# Patient Record
Sex: Male | Born: 1950 | Race: White | Hispanic: No | Marital: Married | Smoking: Former smoker
Health system: Southern US, Community
[De-identification: ages and names within clinical notes are randomized; demographics above are authoritative.]

## PROBLEM LIST (undated history)

## (undated) DIAGNOSIS — IMO0002 Reserved for concepts with insufficient information to code with codable children: Secondary | ICD-10-CM

## (undated) DIAGNOSIS — I739 Peripheral vascular disease, unspecified: Secondary | ICD-10-CM

## (undated) DIAGNOSIS — R600 Localized edema: Secondary | ICD-10-CM

## (undated) DIAGNOSIS — D649 Anemia, unspecified: Secondary | ICD-10-CM

## (undated) DIAGNOSIS — C801 Malignant (primary) neoplasm, unspecified: Secondary | ICD-10-CM

## (undated) DIAGNOSIS — N19 Unspecified kidney failure: Secondary | ICD-10-CM

## (undated) DIAGNOSIS — I10 Essential (primary) hypertension: Secondary | ICD-10-CM

## (undated) DIAGNOSIS — I639 Cerebral infarction, unspecified: Secondary | ICD-10-CM

## (undated) DIAGNOSIS — E785 Hyperlipidemia, unspecified: Secondary | ICD-10-CM

## (undated) DIAGNOSIS — N184 Chronic kidney disease, stage 4 (severe): Secondary | ICD-10-CM

## (undated) DIAGNOSIS — I2510 Atherosclerotic heart disease of native coronary artery without angina pectoris: Secondary | ICD-10-CM

## (undated) DIAGNOSIS — E119 Type 2 diabetes mellitus without complications: Secondary | ICD-10-CM

## (undated) DIAGNOSIS — N9989 Other postprocedural complications and disorders of genitourinary system: Secondary | ICD-10-CM

## (undated) DIAGNOSIS — M25519 Pain in unspecified shoulder: Secondary | ICD-10-CM

## (undated) DIAGNOSIS — H269 Unspecified cataract: Secondary | ICD-10-CM

## (undated) HISTORY — DX: Unspecified cataract: H26.9

## (undated) HISTORY — DX: Essential (primary) hypertension: I10

## (undated) HISTORY — DX: Peripheral vascular disease, unspecified: I73.9

## (undated) HISTORY — PX: EYE SURGERY: SHX253

## (undated) HISTORY — DX: Type 2 diabetes mellitus without complications: E11.9

## (undated) HISTORY — PX: FRACTURE SURGERY: SHX138

## (undated) HISTORY — DX: Unspecified kidney failure: N19

---

## 1988-08-03 HISTORY — PX: COLONOSCOPY: SHX174

## 1997-11-02 ENCOUNTER — Emergency Department (HOSPITAL_COMMUNITY): Admission: EM | Admit: 1997-11-02 | Discharge: 1997-11-02 | Payer: Self-pay | Admitting: Emergency Medicine

## 1998-02-25 ENCOUNTER — Ambulatory Visit (HOSPITAL_COMMUNITY): Admission: RE | Admit: 1998-02-25 | Discharge: 1998-02-25 | Payer: Self-pay | Admitting: Internal Medicine

## 1998-04-01 ENCOUNTER — Ambulatory Visit (HOSPITAL_COMMUNITY): Admission: RE | Admit: 1998-04-01 | Discharge: 1998-04-01 | Payer: Self-pay | Admitting: Gastroenterology

## 2004-06-12 ENCOUNTER — Encounter: Admission: RE | Admit: 2004-06-12 | Discharge: 2004-06-12 | Payer: Self-pay | Admitting: Nephrology

## 2004-07-21 ENCOUNTER — Encounter: Admission: RE | Admit: 2004-07-21 | Discharge: 2004-07-21 | Payer: Self-pay | Admitting: Nephrology

## 2004-08-11 ENCOUNTER — Ambulatory Visit (HOSPITAL_COMMUNITY): Admission: RE | Admit: 2004-08-11 | Discharge: 2004-08-11 | Payer: Self-pay | Admitting: Nephrology

## 2004-08-13 ENCOUNTER — Ambulatory Visit (HOSPITAL_COMMUNITY): Admission: RE | Admit: 2004-08-13 | Discharge: 2004-08-14 | Payer: Self-pay | Admitting: Nephrology

## 2006-08-03 DIAGNOSIS — E1121 Type 2 diabetes mellitus with diabetic nephropathy: Secondary | ICD-10-CM

## 2006-08-03 DIAGNOSIS — I10 Essential (primary) hypertension: Secondary | ICD-10-CM

## 2006-08-03 HISTORY — DX: Type 2 diabetes mellitus with diabetic nephropathy: E11.21

## 2006-08-03 HISTORY — DX: Essential (primary) hypertension: I10

## 2009-04-03 ENCOUNTER — Ambulatory Visit: Payer: Self-pay | Admitting: Family Medicine

## 2011-06-09 ENCOUNTER — Ambulatory Visit: Payer: Self-pay | Admitting: General Surgery

## 2011-06-09 HISTORY — PX: COLONOSCOPY: SHX174

## 2011-06-10 LAB — PATHOLOGY REPORT

## 2013-02-10 ENCOUNTER — Encounter: Payer: Self-pay | Admitting: *Deleted

## 2015-02-26 DIAGNOSIS — N185 Chronic kidney disease, stage 5: Secondary | ICD-10-CM | POA: Insufficient documentation

## 2015-02-26 DIAGNOSIS — N184 Chronic kidney disease, stage 4 (severe): Secondary | ICD-10-CM | POA: Insufficient documentation

## 2015-02-26 DIAGNOSIS — E785 Hyperlipidemia, unspecified: Secondary | ICD-10-CM | POA: Insufficient documentation

## 2015-02-26 DIAGNOSIS — I10 Essential (primary) hypertension: Secondary | ICD-10-CM | POA: Insufficient documentation

## 2015-02-27 ENCOUNTER — Encounter: Payer: Self-pay | Admitting: Family Medicine

## 2015-02-27 ENCOUNTER — Telehealth: Payer: Self-pay

## 2015-02-27 ENCOUNTER — Ambulatory Visit (INDEPENDENT_AMBULATORY_CARE_PROVIDER_SITE_OTHER): Payer: BLUE CROSS/BLUE SHIELD | Admitting: Family Medicine

## 2015-02-27 ENCOUNTER — Ambulatory Visit
Admission: RE | Admit: 2015-02-27 | Discharge: 2015-02-27 | Disposition: A | Discharge status: Home / Self Care | Payer: BLUE CROSS/BLUE SHIELD | Source: Ambulatory Visit | Attending: Family Medicine | Admitting: Family Medicine

## 2015-02-27 VITALS — BP 122/80 | HR 70 | Temp 97.5°F | Resp 16 | Wt 243.0 lb

## 2015-02-27 DIAGNOSIS — I10 Essential (primary) hypertension: Secondary | ICD-10-CM | POA: Diagnosis not present

## 2015-02-27 DIAGNOSIS — M19072 Primary osteoarthritis, left ankle and foot: Secondary | ICD-10-CM | POA: Diagnosis not present

## 2015-02-27 DIAGNOSIS — L729 Follicular cyst of the skin and subcutaneous tissue, unspecified: Secondary | ICD-10-CM | POA: Diagnosis not present

## 2015-02-27 DIAGNOSIS — M7742 Metatarsalgia, left foot: Secondary | ICD-10-CM

## 2015-02-27 DIAGNOSIS — M7732 Calcaneal spur, left foot: Secondary | ICD-10-CM | POA: Diagnosis not present

## 2015-02-27 MED ORDER — TELMISARTAN 80 MG PO TABS: ORAL_TABLET | ORAL | Status: DC

## 2015-02-27 NOTE — Telephone Encounter (Signed)
Patient has been advised, his question to you is what can podiatry do for bone spurs? And if you do advise still for patient to be seen by podiatry he states that he will go based of your opinion. KW

## 2015-02-27 NOTE — Progress Notes (Signed)
Subjective:     Patient ID: Jeremy Dorsey, male   DOB: September 10, 1950, 64 y.o.   MRN: [id number]  HPI  Chief Complaint  Patient presents with  . Groin Swelling    patient reports that he had sweling a month ago on the left side of his scrotum he states that he did not have trauma  . Foot Pain    patient reports pain in the left foot for a month described as a dull ache, patient states that pain is at heel of foot and  radiates up foot to the 2nd digit  States he felt a painless knot in the left side of his scrotum but has resolved over the last week. Regarding his foot pain states he recalls jumping down from a height a few weeks before onset of his foot pain but no other injury reported. Wishes refill on blood pressure medication. Usually has Micardis filled by his renal doctor but has run out and only sees him annually at this time. Accompanied by his wife.   Review of Systems  Genitourinary: Negative for dysuria.       Objective:   Physical Exam  Constitutional: He appears well-developed and well-nourished. No distress.  Genitourinary: Right testis shows no mass, no swelling and no tenderness. Left testis shows no mass, no swelling and no tenderness.  Musculoskeletal:  Tender over the dorsum and plantar aspect of his foot @ his left second metatarsal. Left DF/PF 5/5. No overlying erythema       Assessment:    1. Scrotal cyst: spontaneously resolved   2. Metatarsalgia, left - DG Foot Complete Left; Future  3. Essential hypertension - telmisartan (MICARDIS) 80 MG tablet; TWICE DAILY  Dispense: 180 tablet; Refill: 3 - telmisartan (MICARDIS) 80 MG tablet; TWICE DAILY  Dispense: 14 tablet; Refill: 0    Plan:    Consider podiatry referral pending x-ray results.

## 2015-02-27 NOTE — Telephone Encounter (Signed)
-----   Message from Carmon Ginsberg, [place] sent at 02/27/2015 11:58 AM EDT ----- No fracture but there are bone spurs at the base of your big toe. Do you wish to proceed with podiatry referral?

## 2015-02-27 NOTE — Patient Instructions (Signed)
WE WILL CALL WITH X-RAY RESULTS

## 2015-02-28 NOTE — Telephone Encounter (Signed)
If podiatrist feels bone spurs are contributing to your pain may consider injection or possibly surgery. On the other hand they may not be contributing to your pain and an alternative diagnosis will be suggested. Will proceed with referral if patient wishes.

## 2015-02-28 NOTE — Telephone Encounter (Signed)
LMTCB  aa 

## 2015-03-06 NOTE — Telephone Encounter (Signed)
LMTCB-KW 

## 2015-03-06 NOTE — Telephone Encounter (Signed)
Spoke with patient and he states at this time he would not like to see podiatry, he states that he will see how he does and if pain persist then he will call back and request referral.

## 2015-03-25 ENCOUNTER — Other Ambulatory Visit: Payer: Self-pay | Admitting: Family Medicine

## 2015-03-25 DIAGNOSIS — E1122 Type 2 diabetes mellitus with diabetic chronic kidney disease: Secondary | ICD-10-CM

## 2015-03-25 DIAGNOSIS — E785 Hyperlipidemia, unspecified: Secondary | ICD-10-CM

## 2015-03-25 MED ORDER — GLIPIZIDE 10 MG PO TABS: 10.0000 mg | ORAL_TABLET | Freq: Two times a day (BID) | ORAL | Status: DC

## 2015-03-25 MED ORDER — PRAVASTATIN SODIUM 80 MG PO TABS: 80.0000 mg | ORAL_TABLET | Freq: Every day | ORAL | Status: DC

## 2015-04-03 ENCOUNTER — Encounter: Payer: Self-pay | Admitting: Family Medicine

## 2015-05-09 ENCOUNTER — Other Ambulatory Visit: Payer: Self-pay | Admitting: Family Medicine

## 2015-08-07 DIAGNOSIS — N184 Chronic kidney disease, stage 4 (severe): Secondary | ICD-10-CM | POA: Diagnosis not present

## 2015-08-07 DIAGNOSIS — R809 Proteinuria, unspecified: Secondary | ICD-10-CM | POA: Diagnosis not present

## 2015-08-07 DIAGNOSIS — I129 Hypertensive chronic kidney disease with stage 1 through stage 4 chronic kidney disease, or unspecified chronic kidney disease: Secondary | ICD-10-CM | POA: Diagnosis not present

## 2015-08-07 DIAGNOSIS — E1129 Type 2 diabetes mellitus with other diabetic kidney complication: Secondary | ICD-10-CM | POA: Diagnosis not present

## 2015-09-02 DIAGNOSIS — N184 Chronic kidney disease, stage 4 (severe): Secondary | ICD-10-CM | POA: Diagnosis not present

## 2015-09-04 DIAGNOSIS — N184 Chronic kidney disease, stage 4 (severe): Secondary | ICD-10-CM | POA: Diagnosis not present

## 2015-09-04 DIAGNOSIS — E1129 Type 2 diabetes mellitus with other diabetic kidney complication: Secondary | ICD-10-CM | POA: Diagnosis not present

## 2015-09-30 ENCOUNTER — Ambulatory Visit
Admission: RE | Admit: 2015-09-30 | Discharge: 2015-09-30 | Disposition: A | Discharge status: Home / Self Care | Payer: PPO | Source: Ambulatory Visit | Attending: Family Medicine | Admitting: Family Medicine

## 2015-09-30 ENCOUNTER — Telehealth: Payer: Self-pay

## 2015-09-30 ENCOUNTER — Ambulatory Visit (INDEPENDENT_AMBULATORY_CARE_PROVIDER_SITE_OTHER): Payer: PPO | Admitting: Family Medicine

## 2015-09-30 ENCOUNTER — Encounter: Payer: Self-pay | Admitting: Family Medicine

## 2015-09-30 VITALS — BP 142/100 | HR 72 | Temp 98.1°F | Resp 16 | Wt 237.0 lb

## 2015-09-30 DIAGNOSIS — J40 Bronchitis, not specified as acute or chronic: Secondary | ICD-10-CM | POA: Diagnosis not present

## 2015-09-30 DIAGNOSIS — R05 Cough: Secondary | ICD-10-CM | POA: Diagnosis not present

## 2015-09-30 DIAGNOSIS — R059 Cough, unspecified: Secondary | ICD-10-CM

## 2015-09-30 MED ORDER — PREDNISONE 10 MG PO TABS: ORAL_TABLET | ORAL | Status: DC

## 2015-09-30 MED ORDER — DOXYCYCLINE HYCLATE 100 MG PO TABS: 100.0000 mg | ORAL_TABLET | Freq: Two times a day (BID) | ORAL | Status: DC

## 2015-09-30 MED ORDER — HYDROCODONE-HOMATROPINE 5-1.5 MG/5ML PO SYRP: ORAL_SOLUTION | ORAL | Status: DC

## 2015-09-30 NOTE — Telephone Encounter (Signed)
LMTCB-KW 

## 2015-09-30 NOTE — Patient Instructions (Signed)
Take 1/2 Claritin daily. If no help start prednisone and antibiotic. Further follow up pending x-ray report.

## 2015-09-30 NOTE — Telephone Encounter (Signed)
-----   Message from Carmon Ginsberg, [place] sent at 09/30/2015  3:33 PM EST ----- No pneumonia. Continue plan discussed in the office.

## 2015-09-30 NOTE — Progress Notes (Signed)
Subjective:     Patient ID: Jeremy Dorsey, male   DOB: 16-Feb-1951, 65 y.o.   MRN: [id number]  HPI  Chief Complaint  Patient presents with  . Cough    Patient comes in office today with concerns of cough for the past month. Patient reports symptoms of post nasal drip and chest congestion, he states that now he has left sided flank pain due to cough and has had difficulty falling asleep. Patient has been taking otc Nightquil and Tylenol PM for relief.   States he gets a tickle in his throat which provokes cough especially when he is laying down. 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 No hx of allergies or reflux. States he tried his wife's steroid nasal spray for two days with little relief. "I don't feel sick except for this cough."   Review of Systems  Constitutional: Negative for fever and chills.       Objective:   Physical Exam  Constitutional: He appears well-developed and well-nourished. No distress.  Ears: T.M's intact without inflammation Sinuses: non-tender Throat: no tonsillar enlargement or exudate Neck: no cervical adenopathy Lungs: posterior inspiratory and expiratory wheezes.     Assessment:    1. Cough - DG Chest 2 View; Future - HYDROcodone-homatropine (HYCODAN) 5-1.5 MG/5ML syrup; 5 ml 4-6 hours as needed for cough  Dispense: 240 mL; Refill: 0 - doxycycline (VIBRA-TABS) 100 MG tablet; Take 1 tablet (100 mg total) by mouth 2 (two) times daily.  Dispense: 20 tablet; Refill: 0 - predniSONE (DELTASONE) 10 MG tablet; Taper daily as follows: 6 pills, 5, 4, 3, 2, 1  Dispense: 21 tablet; Refill: 0    Plan:    Will try cough syrup and Claritin first pending x-ray report.

## 2015-10-02 NOTE — Telephone Encounter (Signed)
Patient has been advised. KW 

## 2016-01-08 DIAGNOSIS — Z961 Presence of intraocular lens: Secondary | ICD-10-CM | POA: Diagnosis not present

## 2016-01-14 DIAGNOSIS — I129 Hypertensive chronic kidney disease with stage 1 through stage 4 chronic kidney disease, or unspecified chronic kidney disease: Secondary | ICD-10-CM | POA: Diagnosis not present

## 2016-01-14 DIAGNOSIS — E1129 Type 2 diabetes mellitus with other diabetic kidney complication: Secondary | ICD-10-CM | POA: Diagnosis not present

## 2016-01-14 DIAGNOSIS — R809 Proteinuria, unspecified: Secondary | ICD-10-CM | POA: Diagnosis not present

## 2016-01-14 DIAGNOSIS — N184 Chronic kidney disease, stage 4 (severe): Secondary | ICD-10-CM | POA: Diagnosis not present

## 2016-01-31 DIAGNOSIS — E1129 Type 2 diabetes mellitus with other diabetic kidney complication: Secondary | ICD-10-CM | POA: Diagnosis not present

## 2016-01-31 DIAGNOSIS — N184 Chronic kidney disease, stage 4 (severe): Secondary | ICD-10-CM | POA: Diagnosis not present

## 2016-02-12 DIAGNOSIS — N184 Chronic kidney disease, stage 4 (severe): Secondary | ICD-10-CM | POA: Diagnosis not present

## 2016-03-18 DIAGNOSIS — N5201 Erectile dysfunction due to arterial insufficiency: Secondary | ICD-10-CM | POA: Diagnosis not present

## 2016-03-18 DIAGNOSIS — R3121 Asymptomatic microscopic hematuria: Secondary | ICD-10-CM | POA: Diagnosis not present

## 2016-03-20 DIAGNOSIS — I129 Hypertensive chronic kidney disease with stage 1 through stage 4 chronic kidney disease, or unspecified chronic kidney disease: Secondary | ICD-10-CM | POA: Diagnosis not present

## 2016-03-20 DIAGNOSIS — E1129 Type 2 diabetes mellitus with other diabetic kidney complication: Secondary | ICD-10-CM | POA: Diagnosis not present

## 2016-03-20 DIAGNOSIS — R809 Proteinuria, unspecified: Secondary | ICD-10-CM | POA: Diagnosis not present

## 2016-03-20 DIAGNOSIS — Z6835 Body mass index (BMI) 35.0-35.9, adult: Secondary | ICD-10-CM | POA: Diagnosis not present

## 2016-03-20 DIAGNOSIS — N184 Chronic kidney disease, stage 4 (severe): Secondary | ICD-10-CM | POA: Diagnosis not present

## 2016-03-20 DIAGNOSIS — N2581 Secondary hyperparathyroidism of renal origin: Secondary | ICD-10-CM | POA: Diagnosis not present

## 2016-04-07 ENCOUNTER — Encounter: Payer: Self-pay | Admitting: Family Medicine

## 2016-04-07 ENCOUNTER — Ambulatory Visit (INDEPENDENT_AMBULATORY_CARE_PROVIDER_SITE_OTHER): Payer: PPO | Admitting: Family Medicine

## 2016-04-07 VITALS — BP 108/70 | HR 76 | Temp 97.6°F | Resp 16 | Wt 239.2 lb

## 2016-04-07 DIAGNOSIS — R55 Syncope and collapse: Secondary | ICD-10-CM

## 2016-04-07 DIAGNOSIS — M533 Sacrococcygeal disorders, not elsewhere classified: Secondary | ICD-10-CM

## 2016-04-07 MED ORDER — HYDROCODONE-ACETAMINOPHEN 5-325 MG PO TABS: ORAL_TABLET | ORAL | 0 refills | Status: DC

## 2016-04-07 NOTE — Progress Notes (Signed)
Subjective:     Patient ID: Jeremy Dorsey, male   DOB: 1951-06-20, 65 y.o.   MRN: [id number]  HPI  Chief Complaint  Patient presents with  . Fall    Patient comes in office accompanied by his spouse today with concerns of injury after a fall that occured on 04/04/16. Patient reports that he believes he might have passed out in his home, he  does not recall feeling off balance or dizzy prior to fall. Spouse states she was outside when he heard noise, patient was found laying on his stomach over two stools. Patient has brusing to his forehead and soreness at back of head, he reports pain in his lower back near tailbone. Patient reports taking Tylenol.   States he had been sitting for a prolonged period of time on hard plastic porch stools when he decided to get up and go inside for a more comfortable chair due to mild low back pain. His wife reports she heard him fall. She saw him a few seconds afterwards and he was alert without loss of control of bowel or bladder.Unclear whether he had just gone to the bathroom prior to falling. Sustained a bruise to his left forehead, abrasion to his left elbow and significant coccyx pain. He has just started furosemide per renal on 8/28. He is pending urology evaluation for microscopic hematuria tomorrow. At last renal visit 03/20/16 was found to have A1c of 8.3.  Review of Systems     Objective:   Physical Exam  Constitutional: He is oriented to person, place, and time. He appears well-developed and well-nourished. He appears distressed (moderate coccyx area pain.).  HENT:  Right Ear: No hemotympanum.  Left Ear: No hemotympanum.  Musculoskeletal:  Muscle strength in lower extremities 5/5. SLR's to 90 degrees without radiation of back pain. Palpation of coccyx area provokes significant pain.  Neurological: He is alert and oriented to person, place, and time. Coordination ( Finger to nose; heel to toe, and Romberg all WNL) normal.  Can count backward from 20  and recite the months in reverse accurately.       Assessment:    1. Syncope, unspecified syncope type: suspect multifactorial from diuretic use, suboptimal control of diabetes, and prolonged sitting. - CBC with Differential/Platelet - Renal function panel  2. Coccydynia - HYDROcodone-acetaminophen (NORCO/VICODIN) 5-325 MG tablet; One every 6-8 hours as needed for pain  Dispense: 28 tablet; Refill: 0    Plan:    Further f/u pending lab work. May require low dose glipizide for diabetic control. Consider LS spine x-ray if acute pain not improving over the next few days. Discussed use of a doughnut.

## 2016-04-07 NOTE — Patient Instructions (Signed)
We will call you with the lab results. 

## 2016-04-08 ENCOUNTER — Telehealth: Payer: Self-pay

## 2016-04-08 ENCOUNTER — Other Ambulatory Visit: Payer: Self-pay | Admitting: Family Medicine

## 2016-04-08 DIAGNOSIS — N184 Chronic kidney disease, stage 4 (severe): Secondary | ICD-10-CM

## 2016-04-08 DIAGNOSIS — E1122 Type 2 diabetes mellitus with diabetic chronic kidney disease: Secondary | ICD-10-CM

## 2016-04-08 LAB — CBC WITH DIFFERENTIAL/PLATELET
Basophils Absolute: 0 10*3/uL (ref 0.0–0.2)
Basos: 0 %
EOS (ABSOLUTE): 0.4 10*3/uL (ref 0.0–0.4)
Eos: 5 %
Hematocrit: 43.9 % (ref 37.5–51.0)
Hemoglobin: 14.4 g/dL (ref 12.6–17.7)
Immature Grans (Abs): 0 10*3/uL (ref 0.0–0.1)
Immature Granulocytes: 0 %
Lymphocytes Absolute: 2.8 10*3/uL (ref 0.7–3.1)
Lymphs: 33 %
MCH: 30.5 pg (ref 26.6–33.0)
MCHC: 32.8 g/dL (ref 31.5–35.7)
MCV: 93 fL (ref 79–97)
Monocytes Absolute: 0.4 10*3/uL (ref 0.1–0.9)
Monocytes: 5 %
Neutrophils Absolute: 4.9 10*3/uL (ref 1.4–7.0)
Neutrophils: 57 %
Platelets: 171 10*3/uL (ref 150–379)
RBC: 4.72 x10E6/uL (ref 4.14–5.80)
RDW: 13.4 % (ref 12.3–15.4)
WBC: 8.6 10*3/uL (ref 3.4–10.8)

## 2016-04-08 LAB — RENAL FUNCTION PANEL
Albumin: 4.1 g/dL (ref 3.6–4.8)
BUN/Creatinine Ratio: 19 (ref 10–24)
BUN: 87 mg/dL (ref 8–27)
CO2: 15 mmol/L — ABNORMAL LOW (ref 18–29)
Calcium: 8.8 mg/dL (ref 8.6–10.2)
Chloride: 108 mmol/L — ABNORMAL HIGH (ref 96–106)
Creatinine, Ser: 4.49 mg/dL — ABNORMAL HIGH (ref 0.76–1.27)
GFR calc Af Amer: 15 mL/min/{1.73_m2} — ABNORMAL LOW
GFR calc non Af Amer: 13 mL/min/{1.73_m2} — ABNORMAL LOW
Glucose: 178 mg/dL — ABNORMAL HIGH (ref 65–99)
Phosphorus: 4.2 mg/dL (ref 2.5–4.5)
Potassium: 5.9 mmol/L — ABNORMAL HIGH (ref 3.5–5.2)
Sodium: 138 mmol/L (ref 134–144)

## 2016-04-08 MED ORDER — GLIPIZIDE 5 MG PO TABS: 2.5000 mg | ORAL_TABLET | Freq: Two times a day (BID) | ORAL | 2 refills | Status: DC

## 2016-04-08 NOTE — Telephone Encounter (Signed)
Spoke with paitent and advised as below. He states that his appt with Dr. Mercy Moore is not set till 05/20/16, he states that there would be no way to get appt sooner because appts are limited. Please advise, also patient states that he would not like to start Glipizide at this time he would like to do research on medication first. KW

## 2016-04-08 NOTE — Telephone Encounter (Signed)
Will try to get him in earlier with Dr.Mattingly.

## 2016-04-08 NOTE — Telephone Encounter (Signed)
-----   Message from Carmon Ginsberg, [place] sent at 04/08/2016  7:45 AM EDT ----- No sign of anemia or infection. Sugar is elevated and there is a decline in your kidney numbers suggesting possible dehydration. Would start a low dose medication for diabetes, glipizide, 30 minutes before breakfast and supper daily. Please see if you can see Dr. Mercy Moore in the next week or so rather than wait for your regular appointment. Would see me in 4 weeks to see how your sugar is doing.

## 2016-04-09 DIAGNOSIS — R3121 Asymptomatic microscopic hematuria: Secondary | ICD-10-CM | POA: Diagnosis not present

## 2016-04-09 DIAGNOSIS — N281 Cyst of kidney, acquired: Secondary | ICD-10-CM | POA: Diagnosis not present

## 2016-04-14 DIAGNOSIS — E875 Hyperkalemia: Secondary | ICD-10-CM | POA: Diagnosis not present

## 2016-04-29 ENCOUNTER — Encounter: Payer: Self-pay | Admitting: *Deleted

## 2016-05-13 DIAGNOSIS — N184 Chronic kidney disease, stage 4 (severe): Secondary | ICD-10-CM | POA: Diagnosis not present

## 2016-05-20 DIAGNOSIS — N184 Chronic kidney disease, stage 4 (severe): Secondary | ICD-10-CM | POA: Diagnosis not present

## 2016-05-20 DIAGNOSIS — N2581 Secondary hyperparathyroidism of renal origin: Secondary | ICD-10-CM | POA: Diagnosis not present

## 2016-05-20 DIAGNOSIS — I129 Hypertensive chronic kidney disease with stage 1 through stage 4 chronic kidney disease, or unspecified chronic kidney disease: Secondary | ICD-10-CM | POA: Diagnosis not present

## 2016-05-20 DIAGNOSIS — R809 Proteinuria, unspecified: Secondary | ICD-10-CM | POA: Diagnosis not present

## 2016-05-20 DIAGNOSIS — E1129 Type 2 diabetes mellitus with other diabetic kidney complication: Secondary | ICD-10-CM | POA: Diagnosis not present

## 2016-05-20 DIAGNOSIS — Z6835 Body mass index (BMI) 35.0-35.9, adult: Secondary | ICD-10-CM | POA: Diagnosis not present

## 2016-06-02 ENCOUNTER — Encounter: Payer: Self-pay | Admitting: Emergency Medicine

## 2016-06-02 ENCOUNTER — Emergency Department: Payer: PPO

## 2016-06-02 ENCOUNTER — Emergency Department
Admission: EM | Admit: 2016-06-02 | Discharge: 2016-06-02 | Disposition: A | Discharge status: Home / Self Care | Payer: PPO | Attending: Emergency Medicine | Admitting: Emergency Medicine

## 2016-06-02 DIAGNOSIS — E1122 Type 2 diabetes mellitus with diabetic chronic kidney disease: Secondary | ICD-10-CM | POA: Insufficient documentation

## 2016-06-02 DIAGNOSIS — Z87891 Personal history of nicotine dependence: Secondary | ICD-10-CM | POA: Insufficient documentation

## 2016-06-02 DIAGNOSIS — Y999 Unspecified external cause status: Secondary | ICD-10-CM | POA: Insufficient documentation

## 2016-06-02 DIAGNOSIS — Y9259 Other trade areas as the place of occurrence of the external cause: Secondary | ICD-10-CM | POA: Diagnosis not present

## 2016-06-02 DIAGNOSIS — N184 Chronic kidney disease, stage 4 (severe): Secondary | ICD-10-CM | POA: Diagnosis not present

## 2016-06-02 DIAGNOSIS — X58XXXA Exposure to other specified factors, initial encounter: Secondary | ICD-10-CM | POA: Insufficient documentation

## 2016-06-02 DIAGNOSIS — Y9389 Activity, other specified: Secondary | ICD-10-CM | POA: Diagnosis not present

## 2016-06-02 DIAGNOSIS — M79671 Pain in right foot: Secondary | ICD-10-CM | POA: Diagnosis not present

## 2016-06-02 DIAGNOSIS — M722 Plantar fascial fibromatosis: Secondary | ICD-10-CM | POA: Insufficient documentation

## 2016-06-02 DIAGNOSIS — I129 Hypertensive chronic kidney disease with stage 1 through stage 4 chronic kidney disease, or unspecified chronic kidney disease: Secondary | ICD-10-CM | POA: Insufficient documentation

## 2016-06-02 MED ORDER — PREDNISONE 10 MG PO TABS: 10.0000 mg | ORAL_TABLET | Freq: Every day | ORAL | 0 refills | Status: DC

## 2016-06-02 NOTE — ED Triage Notes (Signed)
Pt comes into the ED via POV c/o right foot pain after chasing a grocery cart and hearing a "pop" in his foot.  Patient unable to ambulate to triage room due to pain.  Patient in NAD at this time with even and unlabored respirations.

## 2016-06-02 NOTE — ED Provider Notes (Signed)
[facility] Emergency Department Provider Note  ____________________________________________  Time seen: Approximately 6:54 PM  I have reviewed the triage vital signs and the nursing notes.   HISTORY  Chief Complaint Foot Pain    HPI Jeremy Dorsey is a 65 y.o. male who presents emergency department complaining of right foot pain. Patient states that he was chasing after an apparent shopping cart when he felt a pop to the sole of his right foot. The patient is reporting pain to the heel radiating up his leg. Patient denies any loss of range of motion. He denies any nose or 2. Patient states that at rest there is no pain but upon bearing weight he has excruciating pain to the heel. No other injury or complaint. No medications prior to arrival.   Past Medical History:  Diagnosis Date  . Diabetes mellitus without complication ([place]) 1696  . Hypertension 2008  . Renal failure     Patient Active Problem List   Diagnosis Date Noted  . Chronic kidney disease (CKD), stage IV (severe) ([place]) 02/26/2015  . Type 2 diabetes mellitus with hyperglycemia ([place]) 02/26/2015  . HLD (hyperlipidemia) 02/26/2015  . BP (high blood pressure) 02/26/2015    Past Surgical History:  Procedure Laterality Date  . COLONOSCOPY  1990  . EYE SURGERY Right    laser surgery    Prior to Admission medications   Medication Sig Start Date End Date Taking? Authorizing Provider  furosemide (LASIX) 40 MG tablet  03/30/16   Historical Provider, MD  glipiZIDE (GLUCOTROL) 5 MG tablet Take 0.5 tablets (2.5 mg total) by mouth 2 (two) times daily before a meal. 04/08/16   Carmon Ginsberg, PA  HYDROcodone-acetaminophen (NORCO/VICODIN) 5-325 MG tablet One every 6-8 hours as needed for pain 04/07/16   Carmon Ginsberg, PA  predniSONE (DELTASONE) 10 MG tablet Take 1 tablet (10 mg total) by mouth daily. 06/02/16   Charline Bills Cuthriell, PA-C  telmisartan (MICARDIS) 40 MG tablet Take 40 mg by mouth daily.     Historical Provider, MD    Allergies Review of patient's allergies indicates no known allergies.  Family History  Problem Relation Age of Onset  . Psoriasis Mother   . Heart failure Father     Social History Social History  Substance Use Topics  . Smoking status: Former Smoker    Packs/day: 1.00    Years: 30.00  . Smokeless tobacco: Never Used  . Alcohol use No     Review of Systems  Constitutional: No fever/chills Cardiovascular: no chest pain. Respiratory: no cough. No SOB. Musculoskeletal: Positive for right foot pain Skin: Negative for rash, abrasions, lacerations, ecchymosis. Neurological: Negative for headaches, focal weakness or numbness. 10-point ROS otherwise negative.  ____________________________________________   PHYSICAL EXAM:  VITAL SIGNS: ED Triage Vitals  Enc Vitals Group     BP 06/02/16 1807 (!) 159/96     Pulse Rate 06/02/16 1807 91     Resp 06/02/16 1807 16     Temp 06/02/16 1807 97.9 F (36.6 C)     Temp Source 06/02/16 1807 Oral     SpO2 06/02/16 1807 99 %     Weight 06/02/16 1808 232 lb (105.2 kg)     Height 06/02/16 1808 5\' 10"  (1.778 m)     Head Circumference --      Peak Flow --      Pain Score 06/02/16 1808 6     Pain Loc --      Pain Edu? --  Excl. in [place]? --      Constitutional: Alert and oriented. Well appearing and in no acute distress. Eyes: Conjunctivae are normal. PERRL. EOMI. Head: Atraumatic. Cardiovascular: Normal rate, regular rhythm. Normal S1 and S2.  Good peripheral circulation. Respiratory: Normal respiratory effort without tachypnea or retractions. Lungs CTAB. Good air entry to the bases with no decreased or absent breath sounds. Musculoskeletal: Full range of motion to all extremities. No gross deformities appreciated.No deformities, edema noted to right foot but inspection. Full range of motion right ankle and all digits right foot. Patient is extremely tender to palpation over the plantar aspect of the  calcaneus. No palpable abnormality. Sensation and cap refill intact 5 digits. Neurologic:  Normal speech and language. No gross focal neurologic deficits are appreciated.  Skin:  Skin is warm, dry and intact. No rash noted. Psychiatric: Mood and affect are normal. Speech and behavior are normal. Patient exhibits appropriate insight and judgement.   ____________________________________________   LABS (all labs ordered are listed, but only abnormal results are displayed)  Labs Reviewed - No data to display ____________________________________________  EKG   ____________________________________________  RADIOLOGY Diamantina Providence Cuthriell, personally viewed and evaluated these images (plain radiographs) as part of my medical decision making, as well as reviewing the written report by the radiologist.  Dg Foot Complete Right  Result Date: 06/02/2016 CLINICAL DATA:  Right foot pain. EXAM: RIGHT FOOT COMPLETE - 3+ VIEW COMPARISON:  None. FINDINGS: There is no evidence of fracture or dislocation. There is no evidence of arthropathy or other focal bone abnormality. Soft tissues are unremarkable. Vascular calcifications noted. There is a moderate plantar calcaneal enthesophyte. IMPRESSION: No evidence of acute injury of the right foot. Electronically Signed   By: Ulyses Jarred M.D.   On: 06/02/2016 18:31    ____________________________________________    PROCEDURES  Procedure(s) performed:    Procedures    Medications - No data to display   ____________________________________________   INITIAL IMPRESSION / ASSESSMENT AND PLAN / ED COURSE  Pertinent labs & imaging results that were available during my care of the patient were reviewed by me and considered in my medical decision making (see chart for details).  Review of the [place] CSRS was performed in accordance of the [place] prior to dispensing any controlled drugs.  Clinical Course    Patient's diagnosis is consistent with  Plantar fasciitis of the right foot. Patient does have significant history of diabetes and renal failure. As such, is only placed on steroid course for inflammation control. If patient's symptoms persist he will follow-up with podiatry for injections.. Patient is given ED precautions to return to the ED for any worsening or new symptoms.     ____________________________________________  FINAL CLINICAL IMPRESSION(S) / ED DIAGNOSES  Final diagnoses:  Plantar fasciitis of right foot      NEW MEDICATIONS STARTED DURING THIS VISIT:  New Prescriptions   PREDNISONE (DELTASONE) 10 MG TABLET    Take 1 tablet (10 mg total) by mouth daily.        This chart was dictated using voice recognition software/Dragon. Despite best efforts to proofread, errors can occur which can change the meaning. Any change was purely unintentional.    Darletta Moll, PA-C 06/02/16 [place], MD 06/02/16 2159

## 2016-06-03 ENCOUNTER — Ambulatory Visit: Payer: PPO | Admitting: Family Medicine

## 2016-06-09 ENCOUNTER — Encounter: Payer: Self-pay | Admitting: *Deleted

## 2016-06-11 ENCOUNTER — Ambulatory Visit: Payer: Self-pay | Admitting: General Surgery

## 2016-06-29 ENCOUNTER — Other Ambulatory Visit: Payer: Self-pay | Admitting: Family Medicine

## 2016-06-29 DIAGNOSIS — N184 Chronic kidney disease, stage 4 (severe): Principal | ICD-10-CM

## 2016-06-29 DIAGNOSIS — E1122 Type 2 diabetes mellitus with diabetic chronic kidney disease: Secondary | ICD-10-CM

## 2016-08-11 ENCOUNTER — Other Ambulatory Visit: Payer: Self-pay | Admitting: Family Medicine

## 2016-08-11 ENCOUNTER — Ambulatory Visit (INDEPENDENT_AMBULATORY_CARE_PROVIDER_SITE_OTHER): Payer: PPO | Admitting: Family Medicine

## 2016-08-11 ENCOUNTER — Encounter: Payer: Self-pay | Admitting: Family Medicine

## 2016-08-11 VITALS — BP 130/76 | HR 76 | Temp 97.7°F | Resp 16 | Wt 244.8 lb

## 2016-08-11 DIAGNOSIS — E119 Type 2 diabetes mellitus without complications: Secondary | ICD-10-CM | POA: Insufficient documentation

## 2016-08-11 DIAGNOSIS — E1121 Type 2 diabetes mellitus with diabetic nephropathy: Secondary | ICD-10-CM | POA: Insufficient documentation

## 2016-08-11 DIAGNOSIS — E785 Hyperlipidemia, unspecified: Principal | ICD-10-CM

## 2016-08-11 DIAGNOSIS — N184 Chronic kidney disease, stage 4 (severe): Secondary | ICD-10-CM

## 2016-08-11 DIAGNOSIS — E1169 Type 2 diabetes mellitus with other specified complication: Secondary | ICD-10-CM

## 2016-08-11 DIAGNOSIS — M79652 Pain in left thigh: Secondary | ICD-10-CM

## 2016-08-11 DIAGNOSIS — E1122 Type 2 diabetes mellitus with diabetic chronic kidney disease: Secondary | ICD-10-CM | POA: Diagnosis not present

## 2016-08-11 LAB — POCT GLYCOSYLATED HEMOGLOBIN (HGB A1C)

## 2016-08-11 MED ORDER — PRAVASTATIN SODIUM 80 MG PO TABS: 80.0000 mg | ORAL_TABLET | Freq: Every day | ORAL | 3 refills | Status: DC

## 2016-08-11 MED ORDER — PREDNISONE 10 MG PO TABS: ORAL_TABLET | ORAL | 0 refills | Status: DC

## 2016-08-11 MED ORDER — GLIPIZIDE 5 MG PO TABS: ORAL_TABLET | ORAL | 5 refills | Status: DC

## 2016-08-11 MED ORDER — CEPHALEXIN 250 MG PO CAPS: 250.0000 mg | ORAL_CAPSULE | Freq: Two times a day (BID) | ORAL | 0 refills | Status: DC

## 2016-08-11 NOTE — Progress Notes (Signed)
Subjective:     Patient ID: Jeremy Dorsey, male   DOB: [birth year], 66 y.o.   MRN: [id number]  HPI  Chief Complaint  Patient presents with  . Knee Pain    Patient comes in office today with complaints of left knee pain that began 08/01/16. Patient denies any injury or incident to cause pain, patient reports difficulty bending knee. Patient reports that skin is sore to the touch and feels like "heat" is radiating from it. Patient states that soreness starts right above knee cap and radiates down, he describes it as a tightness. Patient has been applying heat and taking otc Advil.   He has f/u pending this month with Dr. Mercy Moore, renal. Due for A1C. States his sugars are ranging from 180-230. 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   Review of Systems     Objective:   Physical Exam  Constitutional: He appears well-developed and well-nourished. He appears distressed (moderate pain going from sit to stand in left distal thigh).  Musculoskeletal:  Left quad with increased pain with flexion. Tender over specific area of mid distal thigh. Slight increased erythema and increased warmth over the distal anterior thigh area. ? Mild swelling when compared to the right.       Assessment:    1. Pain of left thigh: Will cover for possible infection and/or strain - cephALEXin (KEFLEX) 250 MG capsule; Take 1 capsule (250 mg total) by mouth 2 (two) times daily.  Dispense: 14 capsule; Refill: 0 - predniSONE (DELTASONE) 10 MG tablet; Taper daily as follows: 6 pills, 5, 4, 3, 2, 1  Dispense: 21 tablet; Refill: 0  2. Type 2 diabetes mellitus with stage 4 chronic kidney disease, without long-term current use of insulin ([place]): increase glipizide for improved control. - POCT glycosylated hemoglobin (Hb A1C) - glipiZIDE (GLUCOTROL) 5 MG tablet; Take one tablet by mouth twice daily 30 minutes before a meal.  Dispense: 60 tablet; Refill: 5    Plan:    Return in 48 hours if not improving. Continue warm  compresses.

## 2016-08-11 NOTE — Patient Instructions (Addendum)
Encourage use of warm compresses. Let's check this again in 48 hours. Return for diabetes follow up in 3 months.

## 2016-08-18 ENCOUNTER — Other Ambulatory Visit: Payer: Self-pay | Admitting: Family Medicine

## 2016-08-18 ENCOUNTER — Telehealth: Payer: Self-pay | Admitting: Family Medicine

## 2016-08-18 DIAGNOSIS — M79606 Pain in leg, unspecified: Secondary | ICD-10-CM

## 2016-08-18 MED ORDER — PREDNISONE 20 MG PO TABS: ORAL_TABLET | ORAL | 0 refills | Status: DC

## 2016-08-18 NOTE — Telephone Encounter (Signed)
Advised patient's wife as below.  

## 2016-08-18 NOTE — Telephone Encounter (Signed)
Pt's wife stated that pt's leg started to improve while taking predniSONE (DELTASONE) 10 MG tablet but as soon as he finished the medication the pain was back to where it was when he came in for OV on 08/11/16. Wife would like a call back to see what they need to do. Pharmacy: [facility]. Please advise. Thanks TNP

## 2016-08-18 NOTE — Telephone Encounter (Signed)
Let him know I have called in a daily non-tapering dose of prednisone for 7 days. If not improved with that would want him to see Dr. Caryn Section as well.

## 2016-08-18 NOTE — Telephone Encounter (Signed)
Please review. Does the patient need to be seen again?

## 2016-08-27 IMAGING — CR DG CHEST 2V
1 series · 2 of 2 positions shown · non-contrast
Comparison: None

CLINICAL DATA: Cough for 1 month, history bronchitis, diabetes
mellitus, hypertension, renal failure, former smoker who quit
smoking last year

EXAM:
CHEST  2 VIEW

[Series 1: dg chest 2 view · 0.14mm/px · 2 of 2 slices shown]
[im 1/2]
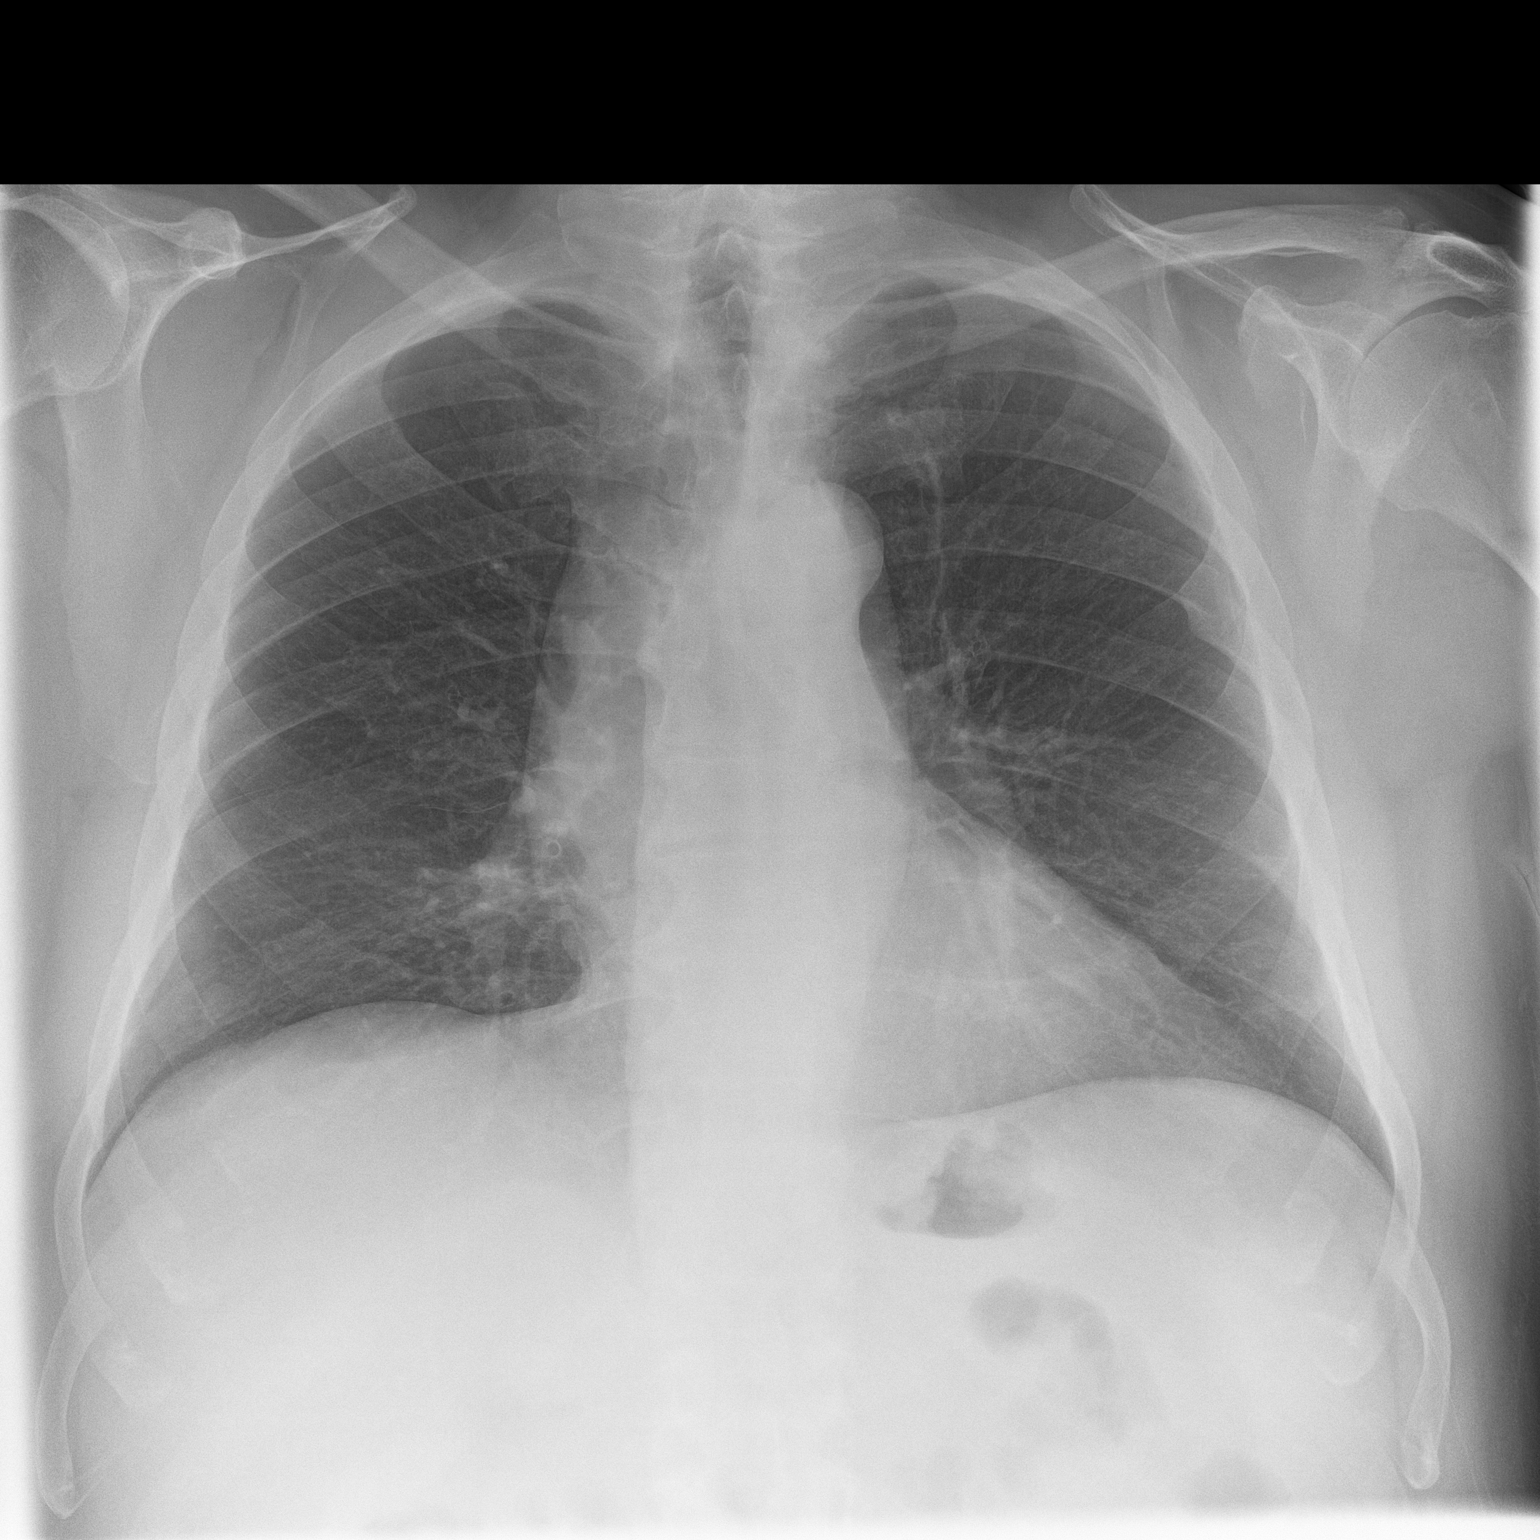
[im 2/2]
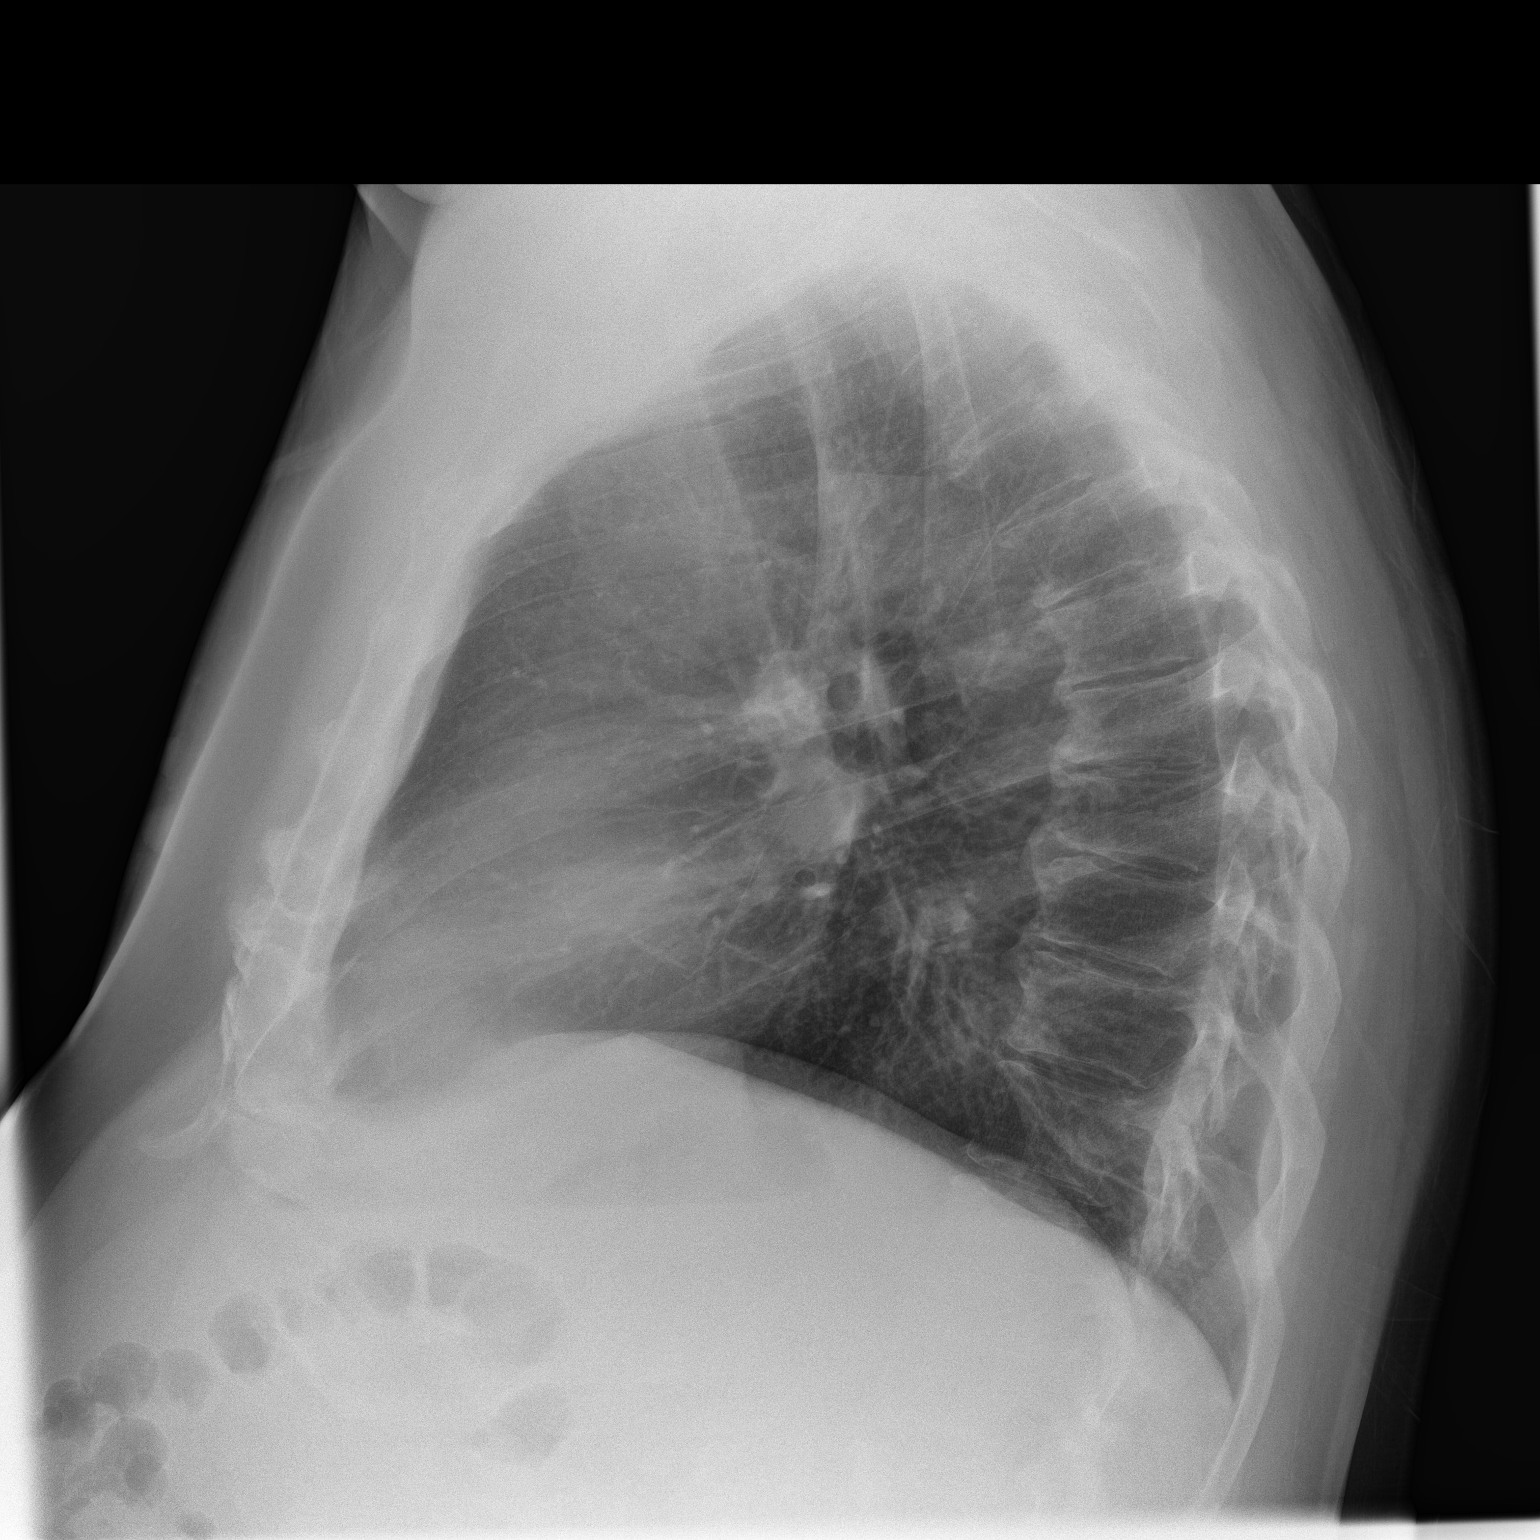

[2 of 2 positions shown; findings below may reference images not displayed]

FINDINGS: Normal heart size and pulmonary vascularity.

Elongation of thoracic aorta.

Bronchitic changes without pulmonary infiltrate, pleural effusion or
pneumothorax.

RIGHT nipple shadow, seen on lateral view as well.

Osseous mineralization grossly normal.

Old healed fracture lateral LEFT sixth rib.
IMPRESSION: Mild bronchitic changes without infiltrate.

## 2016-08-31 ENCOUNTER — Other Ambulatory Visit: Payer: Self-pay | Admitting: Family Medicine

## 2016-08-31 DIAGNOSIS — E1122 Type 2 diabetes mellitus with diabetic chronic kidney disease: Secondary | ICD-10-CM

## 2016-08-31 DIAGNOSIS — N184 Chronic kidney disease, stage 4 (severe): Principal | ICD-10-CM

## 2016-08-31 MED ORDER — GLIPIZIDE 5 MG PO TABS: ORAL_TABLET | ORAL | 1 refills | Status: DC

## 2016-09-07 DIAGNOSIS — R809 Proteinuria, unspecified: Secondary | ICD-10-CM | POA: Diagnosis not present

## 2016-09-07 DIAGNOSIS — N184 Chronic kidney disease, stage 4 (severe): Secondary | ICD-10-CM | POA: Diagnosis not present

## 2016-09-07 DIAGNOSIS — Z6835 Body mass index (BMI) 35.0-35.9, adult: Secondary | ICD-10-CM | POA: Diagnosis not present

## 2016-09-07 DIAGNOSIS — E1129 Type 2 diabetes mellitus with other diabetic kidney complication: Secondary | ICD-10-CM | POA: Diagnosis not present

## 2016-09-07 DIAGNOSIS — I129 Hypertensive chronic kidney disease with stage 1 through stage 4 chronic kidney disease, or unspecified chronic kidney disease: Secondary | ICD-10-CM | POA: Diagnosis not present

## 2016-09-07 DIAGNOSIS — N2581 Secondary hyperparathyroidism of renal origin: Secondary | ICD-10-CM | POA: Diagnosis not present

## 2016-09-26 ENCOUNTER — Encounter: Payer: Self-pay | Admitting: Emergency Medicine

## 2016-09-26 ENCOUNTER — Emergency Department
Admission: EM | Admit: 2016-09-26 | Discharge: 2016-09-26 | Disposition: A | Discharge status: Home / Self Care | Payer: PPO | Attending: Emergency Medicine | Admitting: Emergency Medicine

## 2016-09-26 ENCOUNTER — Emergency Department: Payer: PPO

## 2016-09-26 DIAGNOSIS — Z79899 Other long term (current) drug therapy: Secondary | ICD-10-CM | POA: Diagnosis not present

## 2016-09-26 DIAGNOSIS — I129 Hypertensive chronic kidney disease with stage 1 through stage 4 chronic kidney disease, or unspecified chronic kidney disease: Secondary | ICD-10-CM | POA: Insufficient documentation

## 2016-09-26 DIAGNOSIS — Z87891 Personal history of nicotine dependence: Secondary | ICD-10-CM | POA: Insufficient documentation

## 2016-09-26 DIAGNOSIS — N200 Calculus of kidney: Secondary | ICD-10-CM | POA: Diagnosis not present

## 2016-09-26 DIAGNOSIS — N132 Hydronephrosis with renal and ureteral calculous obstruction: Secondary | ICD-10-CM | POA: Diagnosis not present

## 2016-09-26 DIAGNOSIS — E1122 Type 2 diabetes mellitus with diabetic chronic kidney disease: Secondary | ICD-10-CM | POA: Insufficient documentation

## 2016-09-26 DIAGNOSIS — Z7984 Long term (current) use of oral hypoglycemic drugs: Secondary | ICD-10-CM | POA: Diagnosis not present

## 2016-09-26 DIAGNOSIS — N184 Chronic kidney disease, stage 4 (severe): Secondary | ICD-10-CM | POA: Insufficient documentation

## 2016-09-26 DIAGNOSIS — R109 Unspecified abdominal pain: Secondary | ICD-10-CM | POA: Diagnosis not present

## 2016-09-26 LAB — COMPREHENSIVE METABOLIC PANEL
ALT: 19 U/L (ref 17–63)
AST: 11 U/L — ABNORMAL LOW (ref 15–41)
Albumin: 3.6 g/dL (ref 3.5–5.0)
Alkaline Phosphatase: 87 U/L (ref 38–126)
Anion gap: 10 (ref 5–15)
BUN: 60 mg/dL — ABNORMAL HIGH (ref 6–20)
CO2: 20 mmol/L — ABNORMAL LOW (ref 22–32)
Calcium: 8.6 mg/dL — ABNORMAL LOW (ref 8.9–10.3)
Chloride: 103 mmol/L (ref 101–111)
Creatinine, Ser: 5.69 mg/dL — ABNORMAL HIGH (ref 0.61–1.24)
GFR calc Af Amer: 11 mL/min — ABNORMAL LOW (ref >60)
GFR calc non Af Amer: 9 mL/min — ABNORMAL LOW (ref >60)
Glucose, Bld: 289 mg/dL — ABNORMAL HIGH (ref 65–99)
Potassium: 4.8 mmol/L (ref 3.5–5.1)
Sodium: 133 mmol/L — ABNORMAL LOW (ref 135–145)
Total Bilirubin: 0.8 mg/dL (ref 0.3–1.2)
Total Protein: 7.1 g/dL (ref 6.5–8.1)

## 2016-09-26 LAB — URINALYSIS, COMPLETE (UACMP) WITH MICROSCOPIC
Bacteria, UA: NONE SEEN
Bilirubin Urine: NEGATIVE
Glucose, UA: >=500 mg/dL — AB
Ketones, ur: 5 mg/dL — AB
Leukocytes, UA: NEGATIVE
Nitrite: NEGATIVE
Protein, ur: 100 mg/dL — AB
RBC / HPF: NONE SEEN RBC/hpf (ref 0–5)
Specific Gravity, Urine: 1.009 (ref 1.005–1.030)
Squamous Epithelial / LPF: NONE SEEN
pH: 6 (ref 5.0–8.0)

## 2016-09-26 LAB — CBC WITH DIFFERENTIAL/PLATELET
Basophils Absolute: 0 10*3/uL (ref 0–0.1)
Basophils Relative: 0 %
Eosinophils Absolute: 0.1 10*3/uL (ref 0–0.7)
Eosinophils Relative: 1 %
HCT: 36.5 % — ABNORMAL LOW (ref 40.0–52.0)
Hemoglobin: 12.5 g/dL — ABNORMAL LOW (ref 13.0–18.0)
Lymphocytes Relative: 13 %
Lymphs Abs: 1.9 10*3/uL (ref 1.0–3.6)
MCH: 30.2 pg (ref 26.0–34.0)
MCHC: 34.3 g/dL (ref 32.0–36.0)
MCV: 88 fL (ref 80.0–100.0)
Monocytes Absolute: 1.1 10*3/uL — ABNORMAL HIGH (ref 0.2–1.0)
Monocytes Relative: 8 %
Neutro Abs: 11 10*3/uL — ABNORMAL HIGH (ref 1.4–6.5)
Neutrophils Relative %: 78 %
Platelets: 186 10*3/uL (ref 150–440)
RBC: 4.14 MIL/uL — ABNORMAL LOW (ref 4.40–5.90)
RDW: 12.3 % (ref 11.5–14.5)
WBC: 14 10*3/uL — ABNORMAL HIGH (ref 3.8–10.6)

## 2016-09-26 LAB — LIPASE, BLOOD: Lipase: 57 U/L — ABNORMAL HIGH (ref 11–51)

## 2016-09-26 MED ORDER — LIDOCAINE HCL (CARDIAC) 20 MG/ML IV SOLN: 1.5000 mg/kg | Freq: Once | INTRAVENOUS | Status: DC

## 2016-09-26 MED ORDER — MORPHINE SULFATE (PF) 4 MG/ML IV SOLN
8.0000 mg | Freq: Once | INTRAVENOUS | Status: AC
  Administered 2016-09-26: 8 mg via INTRAVENOUS
  Filled 2016-09-26: qty 2

## 2016-09-26 MED ORDER — LIDOCAINE BOLUS VIA INFUSION
150.0000 mg | Freq: Once | INTRAVENOUS | Status: DC
  Filled 2016-09-26: qty 152

## 2016-09-26 MED ORDER — LIDOCAINE IN D5W 4-5 MG/ML-% IV SOLN: 7.5000 mg/min | INTRAVENOUS | Status: DC

## 2016-09-26 MED ORDER — ONDANSETRON HCL 4 MG/2ML IJ SOLN
INTRAMUSCULAR | Status: AC
  Filled 2016-09-26: qty 2

## 2016-09-26 MED ORDER — LIDOCAINE IN D5W 4-5 MG/ML-% IV SOLN: 7.5000 mg/min | Freq: Once | INTRAVENOUS | Status: DC

## 2016-09-26 MED ORDER — OXYCODONE-ACETAMINOPHEN 5-325 MG PO TABS: 1.0000 | ORAL_TABLET | Freq: Four times a day (QID) | ORAL | 0 refills | Status: DC | PRN

## 2016-09-26 NOTE — Discharge Instructions (Signed)
Please follow-up with your primary care physician on Monday for recheck. Return to the emergency department sooner for any new or worsening symptoms such as fevers, chills, worsening pain, or for any other concerns.  Ct Renal Stone Study  Result Date: 09/26/2016 CLINICAL DATA:  Left-sided back and flank pain for 1 week. Pain radiates to the right groin. EXAM: CT ABDOMEN AND PELVIS WITHOUT CONTRAST TECHNIQUE: Multidetector CT imaging of the abdomen and pelvis was performed following the standard protocol without IV contrast. COMPARISON:  None. FINDINGS: Lower chest: Lung bases show no acute findings. Heart size normal. Coronary artery calcification. No pericardial or pleural effusion. Hepatobiliary: Liver is decreased in attenuation diffusely. Liver and gallbladder are otherwise unremarkable. No biliary ductal dilatation. Pancreas: Negative. Spleen: Negative. Adrenals/Urinary Tract: Adrenal glands are unremarkable. Low-attenuation lesions in the kidneys measure up to 4.6 cm on the left and are likely cysts but definitive characterization is limited without post-contrast imaging. Mild left perinephric stranding and mild left hydronephrosis. Associated left periureteric stranding. There may be a punctate stone in the lower pole left kidney. No associated ureteral stone. Bladder is decompressed. Small urachal remnant. Stomach/Bowel: Stomach, small bowel, appendix and colon are unremarkable. Vascular/Lymphatic: Atherosclerotic calcification of the arterial vasculature without abdominal aortic aneurysm. Retroaortic left renal vein. No pathologically enlarged lymph nodes. Reproductive: Prostate is visualized. Other: No free fluid. Mesenteries and peritoneum are otherwise unremarkable. Musculoskeletal: No worrisome lytic or sclerotic lesions. Degenerative changes are seen in the spine. IMPRESSION: 1. Mild left hydronephrosis with perinephric and periureteric stranding, indicative of recent passage of a stone. 2.  Punctate left renal stone. 3. Aortic atherosclerosis (ICD10-170.0). Coronary artery calcification. 4. Hepatic steatosis. Electronically Signed   By: Lorin Picket M.D.   On: 09/26/2016 11:35

## 2016-09-26 NOTE — ED Notes (Signed)
Patient transported to CT 

## 2016-09-26 NOTE — ED Notes (Signed)
Pt sleeping at this time, family request [place] to hold off on pain medicines.

## 2016-09-26 NOTE — ED Provider Notes (Signed)
[facility] Emergency Department Provider Note  ____________________________________________   First MD Initiated Contact with Patient 09/26/16 1101     (approximate)  I have reviewed the triage vital signs and the nursing notes.   HISTORY  Chief Complaint Flank Pain    HPI Jeremy Dorsey is a 66 y.o. male resents to the emergency department with 1 week of severe left flank pain radiating to his left groin. He feels nauseated and uncomfortable and like he can't find a comfortable position. He has a history of stage IV chronic kidney disease and is never had a kidney stone before. He has no history of abdominal surgeries. She said that yesterday he had a temperature to 99 point something but is not had a true fever. No chills. No diaphoresis. He has difficulty urinating. Denies hematuria. Reports dysuria. No testicular discomfort. He tried Tylenol which did not improve his pain and he did not try any medications today. Today he called his nephrologist at [facility] who advised him to come to the emergency department.   Past Medical History:  Diagnosis Date  . Diabetes mellitus without complication ([place]) 6789  . Hypertension 2008  . Renal failure     Patient Active Problem List   Diagnosis Date Noted  . Type 2 diabetes mellitus with stage 4 chronic kidney disease, without long-term current use of insulin ([place]) 08/11/2016  . Chronic kidney disease (CKD), stage IV (severe) ([place]) 02/26/2015  . HLD (hyperlipidemia) 02/26/2015  . BP (high blood pressure) 02/26/2015    Past Surgical History:  Procedure Laterality Date  . COLONOSCOPY  1990  . EYE SURGERY Right    laser surgery    Prior to Admission medications   Medication Sig Start Date End Date Taking? Authorizing Provider  amLODipine (NORVASC) 10 MG tablet Take 10 mg by mouth daily.  08/10/16  Yes Historical Provider, MD  calcitRIOL (ROCALTROL) 0.25 MCG capsule Take 0.25 mcg by mouth  every other day. 09/08/16  Yes Historical Provider, MD  diphenhydrAMINE (BENADRYL) 25 MG tablet Take 25 mg by mouth daily.   Yes Historical Provider, MD  furosemide (LASIX) 40 MG tablet Take 40 mg by mouth 2 (two) times daily.  03/30/16  Yes Historical Provider, MD  glipiZIDE (GLUCOTROL) 5 MG tablet Take one tablet by mouth twice daily 30 minutes before a meal. Patient taking differently: Take 2.5 mg by mouth 2 (two) times daily before a meal. Take one tablet by mouth twice daily 30 minutes before a meal. 08/31/16  Yes Carmon Ginsberg, PA  sodium bicarbonate 650 MG tablet Take 650 mg by mouth 2 (two) times daily.   Yes Historical Provider, MD  oxyCODONE-acetaminophen (ROXICET) 5-325 MG tablet Take 1 tablet by mouth every 6 (six) hours as needed. 09/26/16 09/26/17  Darel Hong, MD  pravastatin (PRAVACHOL) 80 MG tablet Take 1 tablet (80 mg total) by mouth daily. Patient not taking: Reported on 09/26/2016 08/11/16   Carmon Ginsberg, PA  predniSONE (DELTASONE) 20 MG tablet Two pills daily x 7 days Patient not taking: Reported on 09/26/2016 08/18/16   Carmon Ginsberg, PA    Allergies Patient has no known allergies.  Family History  Problem Relation Age of Onset  . Psoriasis Mother   . Heart failure Father     Social History Social History  Substance Use Topics  . Smoking status: Former Smoker    Packs/day: 1.00    Years: 30.00  . Smokeless tobacco: Never Used  . Alcohol use No  Review of Systems Constitutional: No fever/chills Eyes: No visual changes. ENT: No sore throat. Cardiovascular: Denies chest pain. Respiratory: Denies shortness of breath. Gastrointestinal: Positive for abdominal pain flank pain and nausea Genitourinary: Negative for dysuria. Musculoskeletal: Positive for back pain. Skin: Negative for rash. Neurological: Negative for headaches, focal weakness or numbness.  10-point ROS otherwise negative.  ____________________________________________   PHYSICAL  EXAM:  VITAL SIGNS: ED Triage Vitals  Enc Vitals Group     BP 09/26/16 1047 (!) 166/104     Pulse Rate 09/26/16 1047 (!) 104     Resp 09/26/16 1047 16     Temp 09/26/16 1047 98.2 F (36.8 C)     Temp Source 09/26/16 1047 Oral     SpO2 09/26/16 1047 98 %     Weight 09/26/16 1041 244 lb (110.7 kg)     Height --      Head Circumference --      Peak Flow --      Pain Score 09/26/16 1041 10     Pain Loc --      Pain Edu? --      Excl. in [place]? --     Constitutional: Alert and oriented 4 appears somewhat uncomfortable moving in the bed and cannot seem to find a comfortable position Eyes: Conjunctivae are normal. PERRL. EOMI. Head: Atraumatic. Nose: No congestion/rhinnorhea. Mouth/Throat: Mucous membranes are moist.  Oropharynx non-erythematous. Neck: No stridor.   Cardiovascular: Cardiac rate, regular rhythm. Grossly normal heart sounds.  Good peripheral circulation. Respiratory: Normal respiratory effort.  No retractions. Lungs CTAB. Gastrointestinal: Soft and nontender. No distention. No abdominal bruits. No CVA tenderness. Musculoskeletal: No lower extremity tenderness nor edema.  No joint effusions. Neurologic:  Normal speech and language. No gross focal neurologic deficits are appreciated. No gait instability. Skin:  Skin is warm, dry and intact. No rash noted. Psychiatric: Mood and affect are normal. Speech and behavior are normal.  ____________________________________________   LABS (all labs ordered are listed, but only abnormal results are displayed)  Labs Reviewed  URINALYSIS, COMPLETE (UACMP) WITH MICROSCOPIC - Abnormal; Notable for the following:       Result Value   Color, Urine STRAW (*)    APPearance CLEAR (*)    Glucose, UA >=500 (*)    Hgb urine dipstick SMALL (*)    Ketones, ur 5 (*)    Protein, ur 100 (*)    All other components within normal limits  CBC WITH DIFFERENTIAL/PLATELET - Abnormal; Notable for the following:    WBC 14.0 (*)    RBC 4.14 (*)     Hemoglobin 12.5 (*)    HCT 36.5 (*)    Neutro Abs 11.0 (*)    Monocytes Absolute 1.1 (*)    All other components within normal limits  COMPREHENSIVE METABOLIC PANEL - Abnormal; Notable for the following:    Sodium 133 (*)    CO2 20 (*)    Glucose, Bld 289 (*)    BUN 60 (*)    Creatinine, Ser 5.69 (*)    Calcium 8.6 (*)    AST 11 (*)    GFR calc non Af Amer 9 (*)    GFR calc Af Amer 11 (*)    All other components within normal limits  LIPASE, BLOOD - Abnormal; Notable for the following:    Lipase 57 (*)    All other components within normal limits   _________________ No evidence of urinary tract infection ___________________________  EKG  ED ECG REPORT I, Milta Deiters  Tacie Mccuistion, the attending physician, personally viewed and interpreted this ECG.  Date: 09/26/2016 EKG Time:  Rate: 93 Rhythm: normal sinus rhythm QRS Axis: normal Intervals: normal ST/T Wave abnormalities: normal Conduction Disturbances: none Narrative Interpretation: unremarkable  ____________________________________________  RADIOLOGY  CT without evidence of infection and shows likely recently passed kidney stone ____________________________________________   PROCEDURES  Procedure(s) performed: no  Procedures  Critical Care performed: no  ____________________________________________   INITIAL IMPRESSION / ASSESSMENT AND PLAN / ED COURSE  Pertinent labs & imaging results that were available during my care of the patient were reviewed by me and considered in my medical decision making (see chart for details).  On arrival the patient is fidgeting in bed uncomfortable appearing with left flank pain radiating to his groin which is concerning for renal colic. He has a known history of CK D so I cannot treat him with NSAIDs. IV morphine labs and CT stone protocol pending.  ----------------------------------------- 2:15 PM on 09/26/2016 -----------------------------------------  The patient's pain  is improved, he has no signs of infection. He stable for outpatient management.      ____________________________________________   FINAL CLINICAL IMPRESSION(S) / ED DIAGNOSES  Final diagnoses:  Nephrolithiasis      NEW MEDICATIONS STARTED DURING THIS VISIT:  Discharge Medication List as of 09/26/2016  2:14 PM    START taking these medications   Details  oxyCODONE-acetaminophen (ROXICET) 5-325 MG tablet Take 1 tablet by mouth every 6 (six) hours as needed., Starting Sat 09/26/2016, Until Sun 09/26/2017, Print         Note:  This document was prepared using Dragon voice recognition software and may include unintentional dictation errors.     Darel Hong, MD 09/26/16 2038

## 2016-09-26 NOTE — ED Triage Notes (Signed)
Pt to ed with c/o left flank pain that started 1 week ago, progressively getting worse.  Pt with hx of kidney failure.

## 2016-11-19 ENCOUNTER — Encounter: Payer: Self-pay | Admitting: *Deleted

## 2016-11-19 ENCOUNTER — Encounter: Payer: Self-pay | Admitting: General Surgery

## 2016-11-25 ENCOUNTER — Ambulatory Visit: Payer: Self-pay | Admitting: General Surgery

## 2016-12-24 DIAGNOSIS — I129 Hypertensive chronic kidney disease with stage 1 through stage 4 chronic kidney disease, or unspecified chronic kidney disease: Secondary | ICD-10-CM | POA: Diagnosis not present

## 2016-12-24 DIAGNOSIS — R809 Proteinuria, unspecified: Secondary | ICD-10-CM | POA: Diagnosis not present

## 2016-12-24 DIAGNOSIS — Z6835 Body mass index (BMI) 35.0-35.9, adult: Secondary | ICD-10-CM | POA: Diagnosis not present

## 2016-12-24 DIAGNOSIS — N184 Chronic kidney disease, stage 4 (severe): Secondary | ICD-10-CM | POA: Diagnosis not present

## 2016-12-24 DIAGNOSIS — N2581 Secondary hyperparathyroidism of renal origin: Secondary | ICD-10-CM | POA: Diagnosis not present

## 2016-12-24 DIAGNOSIS — E1129 Type 2 diabetes mellitus with other diabetic kidney complication: Secondary | ICD-10-CM | POA: Diagnosis not present

## 2016-12-30 ENCOUNTER — Encounter: Payer: Self-pay | Admitting: *Deleted

## 2017-02-22 ENCOUNTER — Encounter: Payer: Self-pay | Admitting: Family Medicine

## 2017-02-22 ENCOUNTER — Ambulatory Visit (INDEPENDENT_AMBULATORY_CARE_PROVIDER_SITE_OTHER): Payer: PPO | Admitting: Family Medicine

## 2017-02-22 ENCOUNTER — Other Ambulatory Visit: Payer: Self-pay | Admitting: Family Medicine

## 2017-02-22 VITALS — BP 112/88 | HR 76 | Temp 97.9°F | Resp 16 | Wt 248.0 lb

## 2017-02-22 DIAGNOSIS — N184 Chronic kidney disease, stage 4 (severe): Secondary | ICD-10-CM | POA: Diagnosis not present

## 2017-02-22 DIAGNOSIS — H00024 Hordeolum internum left upper eyelid: Secondary | ICD-10-CM | POA: Diagnosis not present

## 2017-02-22 DIAGNOSIS — E1122 Type 2 diabetes mellitus with diabetic chronic kidney disease: Secondary | ICD-10-CM | POA: Diagnosis not present

## 2017-02-22 LAB — POCT GLYCOSYLATED HEMOGLOBIN (HGB A1C): Hemoglobin A1C: 9.1

## 2017-02-22 MED ORDER — ERYTHROMYCIN 5 MG/GM OP OINT
1.0000 | TOPICAL_OINTMENT | Freq: Four times a day (QID) | OPHTHALMIC | 0 refills | Status: DC
Start: 2017-02-22 — End: 2018-04-29

## 2017-02-22 MED ORDER — GLIPIZIDE 10 MG PO TABS: 10.0000 mg | ORAL_TABLET | Freq: Two times a day (BID) | ORAL | 5 refills | Status: DC

## 2017-02-22 NOTE — Patient Instructions (Signed)
Continue warm compresses to left eye.

## 2017-02-22 NOTE — Progress Notes (Signed)
Subjective:     Patient ID: Jeremy Dorsey, male   DOB: 02-Oct-1950, 66 y.o.   MRN: [id number]  HPI  Chief Complaint  Patient presents with  . Belepharitis    Patient comes in office today with complaints of swelling of his left eye lid since 02/19/17. Patient reports itching and crusting of eye and states that he has been using otc Visine.    Reports he noticed a sty in his left lower eyelid which is improving. Now has upper eyelid swelling and a sty forming on his upper eyelid. No changes in vision or scleral involvement. Also has been lost to f/u for diabetes. Continues to be followed by renal for CKD 4. States he is on the kidney transplant list at [facility].   Review of Systems     Objective:   Physical Exam  Constitutional: He appears well-developed and well-nourished. No distress.  Eyes: Pupils are equal, round, and reactive to light. Left eye exhibits no discharge.  Left upper eyelid with mild swelling, erythema, and tenderness. Medial eyelid with early hordeolum formation but no drainage.       Assessment:    1. Hordeolum internum left upper eyelid - erythromycin (ROMYCIN) ophthalmic ointment; Place 1 application into the left eye 4 (four) times daily.  Dispense: 3.5 g; Refill: 0  2. Type 2 diabetes mellitus with stage 4 chronic kidney disease, without long-term current use of insulin ([place]): will increased glipizid - POCT glycosylated hemoglobin (Hb A1C) - glipiZIDE (GLUCOTROL) 10 MG tablet; Take 1 tablet (10 mg total) by mouth 2 (two) times daily before a meal. Take 30 minutes before a meal.  Dispense: 60 tablet; Refill: 5    Plan:    Encouraged use of warm compresses.

## 2017-04-06 DIAGNOSIS — R809 Proteinuria, unspecified: Secondary | ICD-10-CM | POA: Diagnosis not present

## 2017-04-06 DIAGNOSIS — N2581 Secondary hyperparathyroidism of renal origin: Secondary | ICD-10-CM | POA: Diagnosis not present

## 2017-04-06 DIAGNOSIS — Z6835 Body mass index (BMI) 35.0-35.9, adult: Secondary | ICD-10-CM | POA: Diagnosis not present

## 2017-04-06 DIAGNOSIS — N184 Chronic kidney disease, stage 4 (severe): Secondary | ICD-10-CM | POA: Diagnosis not present

## 2017-04-06 DIAGNOSIS — E1129 Type 2 diabetes mellitus with other diabetic kidney complication: Secondary | ICD-10-CM | POA: Diagnosis not present

## 2017-04-06 DIAGNOSIS — I129 Hypertensive chronic kidney disease with stage 1 through stage 4 chronic kidney disease, or unspecified chronic kidney disease: Secondary | ICD-10-CM | POA: Diagnosis not present

## 2017-04-21 DIAGNOSIS — Z01818 Encounter for other preprocedural examination: Secondary | ICD-10-CM | POA: Diagnosis not present

## 2017-04-28 ENCOUNTER — Other Ambulatory Visit: Payer: Self-pay | Admitting: Family Medicine

## 2017-04-28 DIAGNOSIS — E1122 Type 2 diabetes mellitus with diabetic chronic kidney disease: Secondary | ICD-10-CM

## 2017-04-28 DIAGNOSIS — N184 Chronic kidney disease, stage 4 (severe): Principal | ICD-10-CM

## 2017-04-28 NOTE — Telephone Encounter (Signed)
Refill denied. Patient switched to glipizide 10mg  BID.

## 2017-04-28 NOTE — Telephone Encounter (Signed)
Jeremy Dorsey is out of office please review prescription request. Thank You. KW

## 2017-04-29 DIAGNOSIS — N2581 Secondary hyperparathyroidism of renal origin: Secondary | ICD-10-CM | POA: Insufficient documentation

## 2017-04-29 DIAGNOSIS — H269 Unspecified cataract: Secondary | ICD-10-CM | POA: Insufficient documentation

## 2017-04-29 DIAGNOSIS — Z85820 Personal history of malignant melanoma of skin: Secondary | ICD-10-CM | POA: Diagnosis not present

## 2017-04-29 DIAGNOSIS — Z1159 Encounter for screening for other viral diseases: Secondary | ICD-10-CM | POA: Diagnosis not present

## 2017-04-29 DIAGNOSIS — I129 Hypertensive chronic kidney disease with stage 1 through stage 4 chronic kidney disease, or unspecified chronic kidney disease: Secondary | ICD-10-CM | POA: Diagnosis not present

## 2017-04-29 DIAGNOSIS — N189 Chronic kidney disease, unspecified: Secondary | ICD-10-CM | POA: Diagnosis not present

## 2017-04-29 DIAGNOSIS — N200 Calculus of kidney: Secondary | ICD-10-CM | POA: Insufficient documentation

## 2017-04-29 DIAGNOSIS — N185 Chronic kidney disease, stage 5: Secondary | ICD-10-CM | POA: Diagnosis not present

## 2017-04-29 DIAGNOSIS — Z01818 Encounter for other preprocedural examination: Secondary | ICD-10-CM | POA: Diagnosis not present

## 2017-04-29 DIAGNOSIS — I120 Hypertensive chronic kidney disease with stage 5 chronic kidney disease or end stage renal disease: Secondary | ICD-10-CM | POA: Diagnosis not present

## 2017-04-29 DIAGNOSIS — C4330 Malignant melanoma of unspecified part of face: Secondary | ICD-10-CM | POA: Insufficient documentation

## 2017-04-29 DIAGNOSIS — Z87891 Personal history of nicotine dependence: Secondary | ICD-10-CM | POA: Diagnosis not present

## 2017-04-29 DIAGNOSIS — E1122 Type 2 diabetes mellitus with diabetic chronic kidney disease: Secondary | ICD-10-CM | POA: Diagnosis not present

## 2017-04-29 DIAGNOSIS — E669 Obesity, unspecified: Secondary | ICD-10-CM | POA: Insufficient documentation

## 2017-04-30 IMAGING — DX DG FOOT COMPLETE 3+V*R*
3 series · 3 of 3 positions shown · non-contrast
Comparison: None.

CLINICAL DATA: Right foot pain.

EXAM:
RIGHT FOOT COMPLETE - 3+ VIEW

[foot ap]
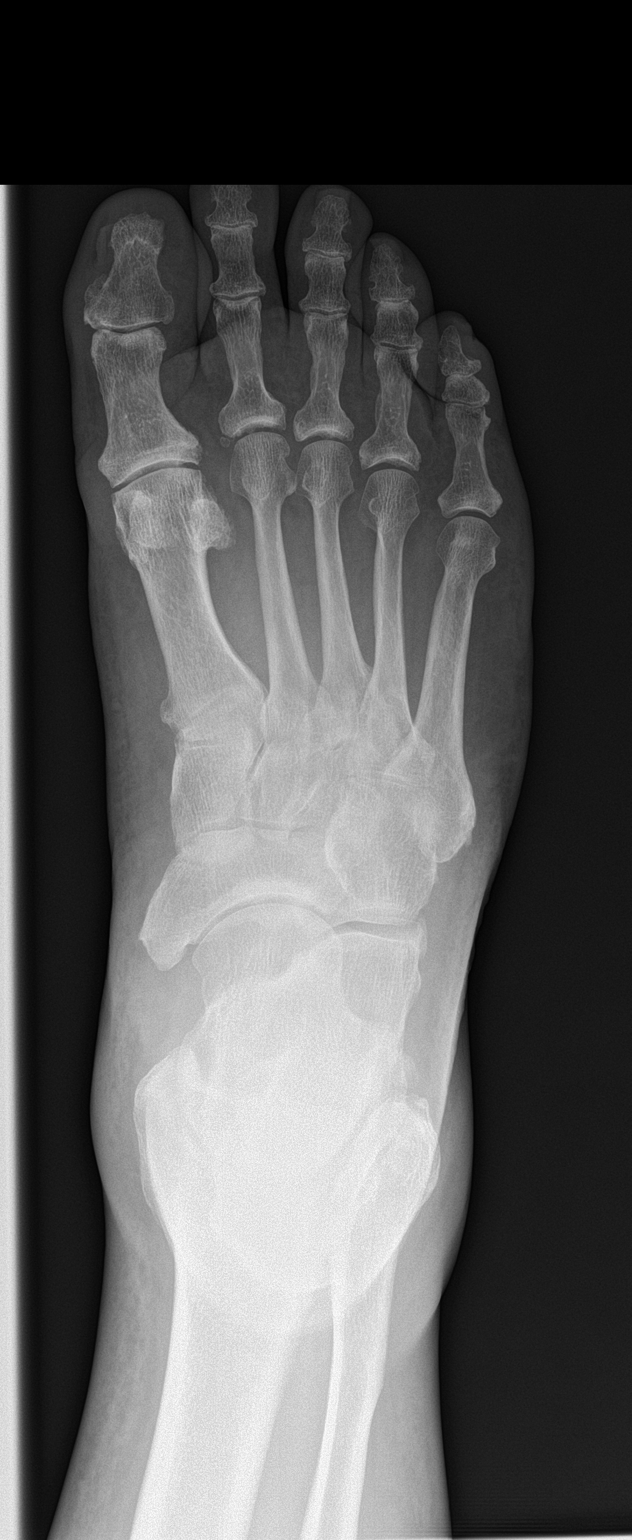

[foot obl]
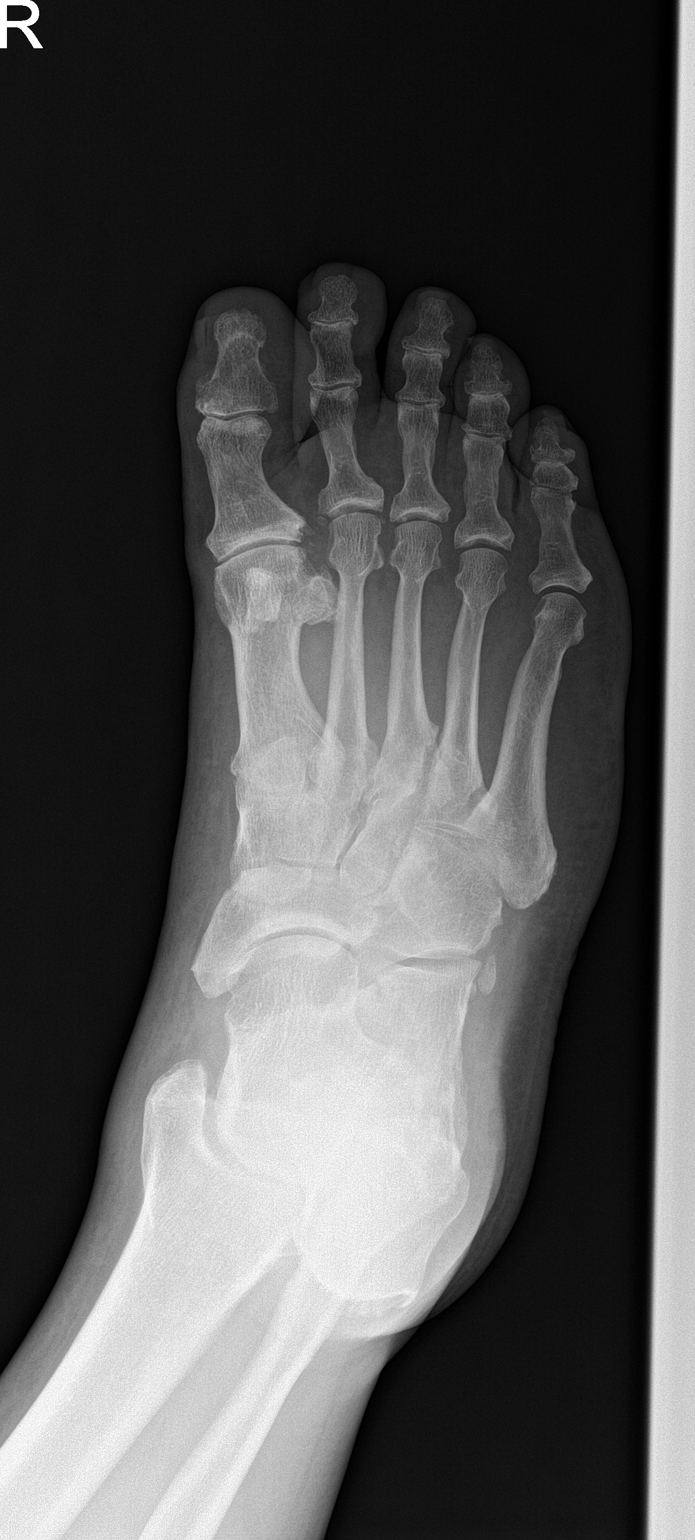

[foot lat]
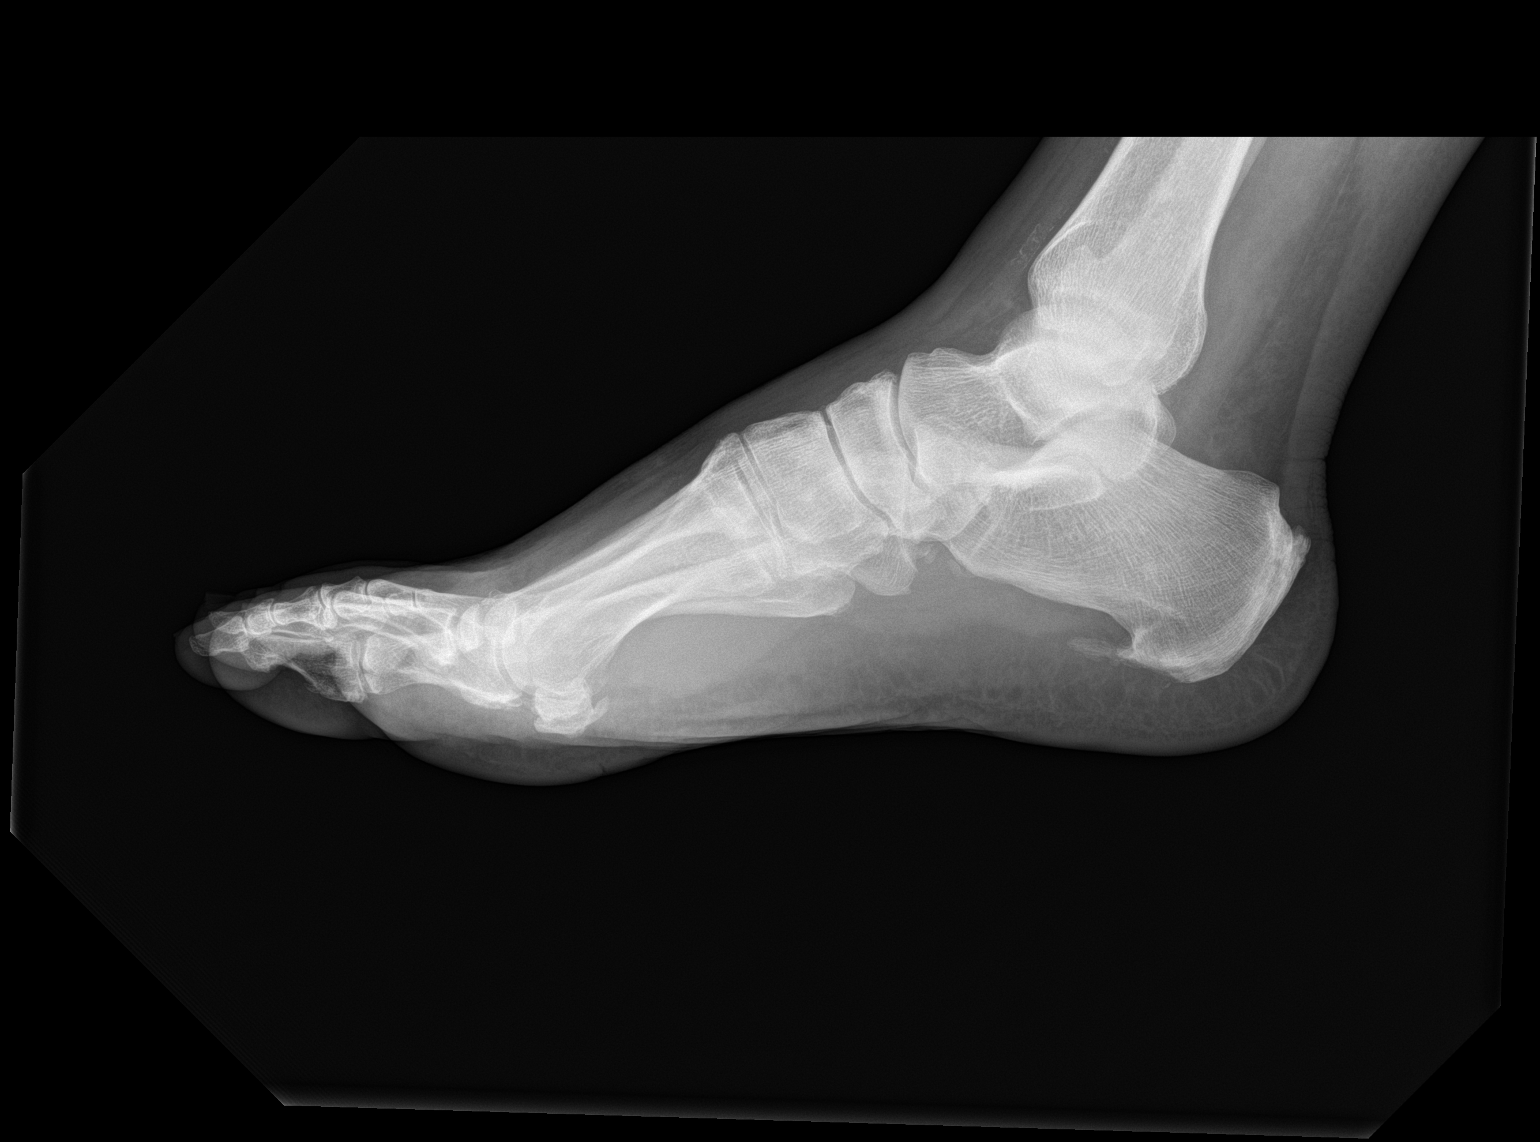

[3 of 3 positions shown; findings below may reference images not displayed]

FINDINGS: There is no evidence of fracture or dislocation. There is no
evidence of arthropathy or other focal bone abnormality. Soft
tissues are unremarkable. Vascular calcifications noted. There is a
moderate plantar calcaneal enthesophyte.
IMPRESSION: No evidence of acute injury of the right foot.

## 2017-05-06 DIAGNOSIS — Z961 Presence of intraocular lens: Secondary | ICD-10-CM | POA: Diagnosis not present

## 2017-06-11 DIAGNOSIS — D0462 Carcinoma in situ of skin of left upper limb, including shoulder: Secondary | ICD-10-CM | POA: Diagnosis not present

## 2017-06-11 DIAGNOSIS — D2261 Melanocytic nevi of right upper limb, including shoulder: Secondary | ICD-10-CM | POA: Diagnosis not present

## 2017-06-11 DIAGNOSIS — C44519 Basal cell carcinoma of skin of other part of trunk: Secondary | ICD-10-CM | POA: Diagnosis not present

## 2017-06-11 DIAGNOSIS — D485 Neoplasm of uncertain behavior of skin: Secondary | ICD-10-CM | POA: Diagnosis not present

## 2017-06-11 DIAGNOSIS — D225 Melanocytic nevi of trunk: Secondary | ICD-10-CM | POA: Diagnosis not present

## 2017-06-11 DIAGNOSIS — Z85828 Personal history of other malignant neoplasm of skin: Secondary | ICD-10-CM | POA: Diagnosis not present

## 2017-06-11 DIAGNOSIS — L570 Actinic keratosis: Secondary | ICD-10-CM | POA: Diagnosis not present

## 2017-06-11 DIAGNOSIS — D2272 Melanocytic nevi of left lower limb, including hip: Secondary | ICD-10-CM | POA: Diagnosis not present

## 2017-06-11 DIAGNOSIS — X32XXXA Exposure to sunlight, initial encounter: Secondary | ICD-10-CM | POA: Diagnosis not present

## 2017-06-16 DIAGNOSIS — H43813 Vitreous degeneration, bilateral: Secondary | ICD-10-CM | POA: Diagnosis not present

## 2017-06-23 DIAGNOSIS — I6523 Occlusion and stenosis of bilateral carotid arteries: Secondary | ICD-10-CM | POA: Diagnosis not present

## 2017-06-23 DIAGNOSIS — Z01818 Encounter for other preprocedural examination: Secondary | ICD-10-CM | POA: Diagnosis not present

## 2017-06-23 DIAGNOSIS — N281 Cyst of kidney, acquired: Secondary | ICD-10-CM | POA: Diagnosis not present

## 2017-06-23 DIAGNOSIS — I342 Nonrheumatic mitral (valve) stenosis: Secondary | ICD-10-CM | POA: Diagnosis not present

## 2017-06-23 DIAGNOSIS — N186 End stage renal disease: Secondary | ICD-10-CM | POA: Diagnosis not present

## 2017-06-23 DIAGNOSIS — Z01810 Encounter for preprocedural cardiovascular examination: Secondary | ICD-10-CM | POA: Diagnosis not present

## 2017-07-14 DIAGNOSIS — R809 Proteinuria, unspecified: Secondary | ICD-10-CM | POA: Diagnosis not present

## 2017-07-14 DIAGNOSIS — N184 Chronic kidney disease, stage 4 (severe): Secondary | ICD-10-CM | POA: Diagnosis not present

## 2017-07-14 DIAGNOSIS — Z6835 Body mass index (BMI) 35.0-35.9, adult: Secondary | ICD-10-CM | POA: Diagnosis not present

## 2017-07-14 DIAGNOSIS — I129 Hypertensive chronic kidney disease with stage 1 through stage 4 chronic kidney disease, or unspecified chronic kidney disease: Secondary | ICD-10-CM | POA: Diagnosis not present

## 2017-07-14 DIAGNOSIS — E1122 Type 2 diabetes mellitus with diabetic chronic kidney disease: Secondary | ICD-10-CM | POA: Diagnosis not present

## 2017-07-14 DIAGNOSIS — N2581 Secondary hyperparathyroidism of renal origin: Secondary | ICD-10-CM | POA: Diagnosis not present

## 2017-07-21 DIAGNOSIS — D0462 Carcinoma in situ of skin of left upper limb, including shoulder: Secondary | ICD-10-CM | POA: Diagnosis not present

## 2017-07-29 ENCOUNTER — Telehealth: Payer: Self-pay | Admitting: Family Medicine

## 2017-07-29 DIAGNOSIS — E1129 Type 2 diabetes mellitus with other diabetic kidney complication: Secondary | ICD-10-CM | POA: Diagnosis not present

## 2017-07-29 NOTE — Telephone Encounter (Signed)
Pt advised.

## 2017-07-29 NOTE — Telephone Encounter (Signed)
Pt called wanting to know when he had his last A1C done.  His call back is [phone]  Thanks teri

## 2017-08-14 ENCOUNTER — Other Ambulatory Visit: Payer: Self-pay | Admitting: Family Medicine

## 2017-08-14 DIAGNOSIS — E1169 Type 2 diabetes mellitus with other specified complication: Secondary | ICD-10-CM

## 2017-08-14 DIAGNOSIS — E785 Hyperlipidemia, unspecified: Principal | ICD-10-CM

## 2017-08-16 DIAGNOSIS — H43813 Vitreous degeneration, bilateral: Secondary | ICD-10-CM | POA: Diagnosis not present

## 2017-08-24 DIAGNOSIS — L905 Scar conditions and fibrosis of skin: Secondary | ICD-10-CM | POA: Diagnosis not present

## 2017-08-24 DIAGNOSIS — C44519 Basal cell carcinoma of skin of other part of trunk: Secondary | ICD-10-CM | POA: Diagnosis not present

## 2017-10-25 DIAGNOSIS — Z6835 Body mass index (BMI) 35.0-35.9, adult: Secondary | ICD-10-CM | POA: Diagnosis not present

## 2017-10-25 DIAGNOSIS — E1122 Type 2 diabetes mellitus with diabetic chronic kidney disease: Secondary | ICD-10-CM | POA: Diagnosis not present

## 2017-10-25 DIAGNOSIS — R809 Proteinuria, unspecified: Secondary | ICD-10-CM | POA: Diagnosis not present

## 2017-10-25 DIAGNOSIS — N184 Chronic kidney disease, stage 4 (severe): Secondary | ICD-10-CM | POA: Diagnosis not present

## 2017-10-25 DIAGNOSIS — N2581 Secondary hyperparathyroidism of renal origin: Secondary | ICD-10-CM | POA: Diagnosis not present

## 2017-10-25 DIAGNOSIS — I129 Hypertensive chronic kidney disease with stage 1 through stage 4 chronic kidney disease, or unspecified chronic kidney disease: Secondary | ICD-10-CM | POA: Diagnosis not present

## 2017-11-15 DIAGNOSIS — H43813 Vitreous degeneration, bilateral: Secondary | ICD-10-CM | POA: Diagnosis not present

## 2017-11-29 ENCOUNTER — Emergency Department: Payer: PPO

## 2017-11-29 ENCOUNTER — Emergency Department
Admission: EM | Admit: 2017-11-29 | Discharge: 2017-11-29 | Disposition: A | Discharge status: Home / Self Care | Payer: PPO | Attending: Emergency Medicine | Admitting: Emergency Medicine

## 2017-11-29 ENCOUNTER — Encounter: Payer: Self-pay | Admitting: Emergency Medicine

## 2017-11-29 ENCOUNTER — Other Ambulatory Visit: Payer: Self-pay

## 2017-11-29 DIAGNOSIS — W010XXA Fall on same level from slipping, tripping and stumbling without subsequent striking against object, initial encounter: Secondary | ICD-10-CM | POA: Insufficient documentation

## 2017-11-29 DIAGNOSIS — I129 Hypertensive chronic kidney disease with stage 1 through stage 4 chronic kidney disease, or unspecified chronic kidney disease: Secondary | ICD-10-CM | POA: Insufficient documentation

## 2017-11-29 DIAGNOSIS — M23304 Other meniscus derangements, unspecified medial meniscus, left knee: Secondary | ICD-10-CM | POA: Insufficient documentation

## 2017-11-29 DIAGNOSIS — Z87891 Personal history of nicotine dependence: Secondary | ICD-10-CM | POA: Insufficient documentation

## 2017-11-29 DIAGNOSIS — S8992XA Unspecified injury of left lower leg, initial encounter: Secondary | ICD-10-CM | POA: Diagnosis not present

## 2017-11-29 DIAGNOSIS — Z79899 Other long term (current) drug therapy: Secondary | ICD-10-CM | POA: Insufficient documentation

## 2017-11-29 DIAGNOSIS — N184 Chronic kidney disease, stage 4 (severe): Secondary | ICD-10-CM | POA: Insufficient documentation

## 2017-11-29 DIAGNOSIS — M23232 Derangement of other medial meniscus due to old tear or injury, left knee: Secondary | ICD-10-CM | POA: Diagnosis not present

## 2017-11-29 DIAGNOSIS — Y9301 Activity, walking, marching and hiking: Secondary | ICD-10-CM | POA: Diagnosis not present

## 2017-11-29 DIAGNOSIS — Z7984 Long term (current) use of oral hypoglycemic drugs: Secondary | ICD-10-CM | POA: Insufficient documentation

## 2017-11-29 DIAGNOSIS — M7989 Other specified soft tissue disorders: Secondary | ICD-10-CM | POA: Diagnosis not present

## 2017-11-29 DIAGNOSIS — E1122 Type 2 diabetes mellitus with diabetic chronic kidney disease: Secondary | ICD-10-CM | POA: Insufficient documentation

## 2017-11-29 DIAGNOSIS — M25462 Effusion, left knee: Secondary | ICD-10-CM | POA: Insufficient documentation

## 2017-11-29 DIAGNOSIS — Y92019 Unspecified place in single-family (private) house as the place of occurrence of the external cause: Secondary | ICD-10-CM | POA: Diagnosis not present

## 2017-11-29 DIAGNOSIS — Z7902 Long term (current) use of antithrombotics/antiplatelets: Secondary | ICD-10-CM | POA: Insufficient documentation

## 2017-11-29 DIAGNOSIS — Y998 Other external cause status: Secondary | ICD-10-CM | POA: Insufficient documentation

## 2017-11-29 MED ORDER — ONDANSETRON 8 MG PO TBDP
8.0000 mg | ORAL_TABLET | Freq: Once | ORAL | Status: AC
Start: 2017-11-29 — End: 2017-11-29
  Administered 2017-11-29: 8 mg via ORAL
  Filled 2017-11-29: qty 1

## 2017-11-29 MED ORDER — HYDROCODONE-ACETAMINOPHEN 5-325 MG PO TABS
1.0000 | ORAL_TABLET | Freq: Once | ORAL | Status: AC
  Administered 2017-11-29: 1 via ORAL
  Filled 2017-11-29: qty 1

## 2017-11-29 MED ORDER — PREDNISONE 20 MG PO TABS
60.0000 mg | ORAL_TABLET | Freq: Once | ORAL | Status: AC
  Administered 2017-11-29: 60 mg via ORAL
  Filled 2017-11-29: qty 3

## 2017-11-29 MED ORDER — PREDNISONE 10 MG PO TABS: 10.0000 mg | ORAL_TABLET | Freq: Every day | ORAL | 0 refills | Status: DC

## 2017-11-29 MED ORDER — HYDROCODONE-ACETAMINOPHEN 5-325 MG PO TABS: 1.0000 | ORAL_TABLET | ORAL | 0 refills | Status: DC | PRN

## 2017-11-29 NOTE — ED Provider Notes (Signed)
[facility] Emergency Department Provider Note  ____________________________________________  Time seen: Approximately 7:24 PM  I have reviewed the triage vital signs and the nursing notes.   HISTORY  Chief Complaint Knee Injury    HPI Jeremy Dorsey is a 67 y.o. male presents emergency department complaining of left knee pain.  Patient reports that he was at home, tripped over his left lower fell landing on his knee.  Patient reporting worsening pain since the injury.  Patient is able to bear weight and states that pain increases after being "spinal" and not ambulating then getting up and ambulating.  Patient is been taking Motrin without relief.  Patient is typically mobile with no issues but is using a cane to help prepare partial weight.  Patient did not hit his head or lose consciousness.  No other injury or complaint.  No other medications prior to arrival.  Patient has a history of diabetes, hypertension, chronic kidney disease.  Past Medical History:  Diagnosis Date  . Diabetes mellitus without complication ([place]) 6301  . Hypertension 2008  . Renal failure     Patient Active Problem List   Diagnosis Date Noted  . Type 2 diabetes mellitus with stage 4 chronic kidney disease, without long-term current use of insulin ([place]) 08/11/2016  . Chronic kidney disease (CKD), stage IV (severe) ([place]) 02/26/2015  . HLD (hyperlipidemia) 02/26/2015  . BP (high blood pressure) 02/26/2015    Past Surgical History:  Procedure Laterality Date  . COLONOSCOPY  1990  . COLONOSCOPY  06/09/2011   Dr Bary Castilla  . EYE SURGERY Right    laser surgery    Prior to Admission medications   Medication Sig Start Date End Date Taking? Authorizing Provider  amLODipine (NORVASC) 10 MG tablet Take 10 mg by mouth daily.  08/10/16   [provider]  calcitRIOL (ROCALTROL) 0.25 MCG capsule Take 0.25 mcg by mouth every other day. 09/08/16   [provider]   diphenhydrAMINE (BENADRYL) 25 MG tablet Take 25 mg by mouth daily.    [provider]  erythromycin [facility]) ophthalmic ointment Place 1 application into the left eye 4 (four) times daily. 02/22/17   Carmon Ginsberg, PA  furosemide (LASIX) 40 MG tablet Take 40 mg by mouth 2 (two) times daily.  03/30/16   [provider]  glipiZIDE (GLUCOTROL) 10 MG tablet Take 1 tablet (10 mg total) by mouth 2 (two) times daily before a meal. Take 30 minutes before a meal. 02/22/17   Carmon Ginsberg, PA  HYDROcodone-acetaminophen (NORCO/VICODIN) 5-325 MG tablet Take 1 tablet by mouth every 4 (four) hours as needed for moderate pain. 11/29/17   Krysti Hickling, Charline Bills, PA-C  pravastatin (PRAVACHOL) 80 MG tablet TAKE 1 TABLET (80 MG TOTAL) BY MOUTH DAILY. 08/14/17   Carmon Ginsberg, PA  predniSONE (DELTASONE) 10 MG tablet Take 1 tablet (10 mg total) by mouth daily. 11/29/17   Amber Guthridge, Charline Bills, PA-C  sodium bicarbonate 650 MG tablet Take 650 mg by mouth 2 (two) times daily.    [provider]    Allergies Patient has no known allergies.  Family History  Problem Relation Age of Onset  . Psoriasis Mother   . Heart failure Father     Social History Social History   Tobacco Use  . Smoking status: Former Smoker    Packs/day: 1.00    Years: 30.00    Pack years: 30.00  . Smokeless tobacco: Never Used  Substance Use Topics  . Alcohol use: No  .  Drug use: No     Review of Systems  Constitutional: No fever/chills Eyes: No visual changes.  Cardiovascular: no chest pain. Respiratory: no cough. No SOB. Gastrointestinal: No abdominal pain.  No nausea, no vomiting.   Musculoskeletal: Positive for left knee pain Skin: Negative for rash, abrasions, lacerations, ecchymosis. Neurological: Negative for headaches, focal weakness or numbness. 10-point ROS otherwise negative.  ____________________________________________   PHYSICAL EXAM:  VITAL SIGNS: ED Triage Vitals  Enc Vitals  Group     BP 11/29/17 1911 (!) 154/88     Pulse Rate 11/29/17 1911 100     Resp 11/29/17 1911 20     Temp 11/29/17 1911 98 F (36.7 C)     Temp Source 11/29/17 1911 Oral     SpO2 11/29/17 1911 99 %     Weight 11/29/17 1911 240 lb (108.9 kg)     Height 11/29/17 1911 5\' 10"  (1.778 m)     Head Circumference --      Peak Flow --      Pain Score 11/29/17 1921 7     Pain Loc --      Pain Edu? --      Excl. in [place]? --      Constitutional: Alert and oriented. Well appearing and in no acute distress. Eyes: Conjunctivae are normal. PERRL. EOMI. Head: Atraumatic. Neck: No stridor.    Cardiovascular: Normal rate, regular rhythm. Normal S1 and S2.  Good peripheral circulation. Respiratory: Normal respiratory effort without tachypnea or retractions. Lungs CTAB. Good air entry to the bases with no decreased or absent breath sounds. Musculoskeletal: Full range of motion to all extremities. No gross deformities appreciated.  Left knee edematous when compared with right.  Limited range of motion.  Patient is very tender to palpation along the medial joint line and diffusely tender to palpation of the anterior aspect of the knee.  No palpable abnormality.  Positive ballottement in the suprapatellar region.  Varus, valgus, Lachman's is negative.  McMurray's is positive for medial meniscus.  Dorsalis pedis pulse intact distally.  Sensation intact distally. Neurologic:  Normal speech and language. No gross focal neurologic deficits are appreciated.  Skin:  Skin is warm, dry and intact. No rash noted. Psychiatric: Mood and affect are normal. Speech and behavior are normal. Patient exhibits appropriate insight and judgement.   ____________________________________________   LABS (all labs ordered are listed, but only abnormal results are displayed)  Labs Reviewed - No data to display ____________________________________________  EKG   ____________________________________________  RADIOLOGY Diamantina [facility] Jaquil Todt, personally viewed and evaluated these images (plain radiographs) as part of my medical decision making, as well as reviewing the written report by the radiologist.  Radiologist of no acute osseous abnormality, significant osteoarthritis with joint effusion.  Dg Knee Complete 4 Views Left  Result Date: 11/29/2017 CLINICAL DATA:  Trip and fall in garage yesterday landing on LEFT knee. Persistent pain. EXAM: LEFT KNEE - COMPLETE 4+ VIEW COMPARISON:  LEFT knee radiograph April 03, 2009 FINDINGS: No acute fracture deformity or dislocation. No destructive bony lesions. Moderate to severe patellofemoral medial compartment joint space narrowing with periarticular sclerosis and marginal spurring compatible with osteoarthrosis. Mild tibial spine peaking. Faint calcifications in popliteal fossa, possible loose bodies. Patella enthesopathy. Prepatellar soft tissue swelling with large suprapatellar joint effusion. Mild vascular calcifications. IMPRESSION: 1. Prepatellar soft tissue swelling with large suprapatellar joint effusion. 2. No fracture deformity or dislocation. 3. 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 Electronically Signed   By:  Elon Alas M.D.   On: 11/29/2017 19:44    ____________________________________________    PROCEDURES  Procedure(s) performed:    .Splint Application Date/Time: 1/49/7026 8:10 PM Performed by: Darletta Moll, PA-C Authorized by: Darletta Moll, PA-C   Consent:    Consent obtained:  Verbal   Consent given by:  Patient   Risks discussed:  Pain and swelling Pre-procedure details:    Sensation:  Normal Procedure details:    Laterality:  Left   Location:  Knee   Knee:  L knee   Splint type:  Knee immobilizer   Supplies:  Prefabricated splint Post-procedure details:    Pain:  Unchanged   Sensation:  Normal   Patient tolerance of procedure:  Tolerated well, no immediate  complications      Medications  HYDROcodone-acetaminophen (NORCO/VICODIN) 5-325 MG per tablet 1 tablet (has no administration in time range)  predniSONE (DELTASONE) tablet 60 mg (has no administration in time range)  ondansetron (ZOFRAN-ODT) disintegrating tablet 8 mg (has no administration in time range)     ____________________________________________   INITIAL IMPRESSION / ASSESSMENT AND PLAN / ED COURSE  Pertinent labs & imaging results that were available during my care of the patient were reviewed by me and considered in my medical decision making (see chart for details).  Review of the [place] CSRS was performed in accordance of the [place] prior to dispensing any controlled drugs.     Patient's diagnosis is consistent with left knee injury, joint effusion, likely meniscal derangement.  Patient presents with sharp left knee pain and swelling.  Differential included fracture, ligament rupture, meniscal tear.  X-ray reveals joint effusion but no other acute osseous abnormality.  Osteoarthritis is appreciated.  On exam, positive for joint effusion with palpation.  Due to the traumatic nature, I will not perform joint aspiration.  Patient's symptoms are consistent with contusion versus derangement of the medial meniscus.  Knee is immobilized using the immobilizer.  Patient will ambulate with crutches.  He is to follow-up with orthopedics for further assessment and management of knee injury.  Patient is a diabetic with stage IV renal failure.  At this time, I will place the patient on steroids versus anti-inflammatories.  I have cautioned that this may raise the patient's blood sugar, however this is of less concern in the short-term versus long-term injury patient's remaining renal status.  Patient be placed on 12-day course of steroid.  He is given prescription for Vicodin for pain.  Patient will follow up with orthopedics. Patient is given ED precautions to return to the ED for any worsening or  new symptoms.     ____________________________________________  FINAL CLINICAL IMPRESSION(S) / ED DIAGNOSES  Final diagnoses:  Injury of left knee, initial encounter  Effusion of left knee  Derangement of medial meniscus of left knee      NEW MEDICATIONS STARTED DURING THIS VISIT:  ED Discharge Orders        Ordered    predniSONE (DELTASONE) 10 MG tablet  Daily    Note to Pharmacy:  Take 6 pills x 2 days, 5 pills x 2 days, 4 pills x 2 days, 3 pills x 2 days, 2 pills x 2 days, and 1 pill x 2 days   11/29/17 2009    HYDROcodone-acetaminophen (NORCO/VICODIN) 5-325 MG tablet  Every 4 hours PRN     11/29/17 2009          This chart was dictated using voice recognition software/Dragon. Despite best efforts to proofread,  errors can occur which can change the meaning. Any change was purely unintentional.    Darletta Moll, PA-C 11/29/17 2015    Harvest Dark, MD 11/29/17 2236

## 2017-11-29 NOTE — ED Triage Notes (Signed)
Pt to triage via w/c with no distress noted; reports yesterday tripped over blower in garage landing on left knee--c/o persistent pain

## 2017-12-02 ENCOUNTER — Other Ambulatory Visit: Payer: Self-pay | Admitting: Student

## 2017-12-02 ENCOUNTER — Ambulatory Visit
Admission: RE | Admit: 2017-12-02 | Discharge: 2017-12-02 | Disposition: A | Discharge status: Home / Self Care | Payer: PPO | Source: Ambulatory Visit | Attending: Student | Admitting: Student

## 2017-12-02 ENCOUNTER — Ambulatory Visit: Payer: Self-pay

## 2017-12-02 DIAGNOSIS — S76112A Strain of left quadriceps muscle, fascia and tendon, initial encounter: Secondary | ICD-10-CM | POA: Insufficient documentation

## 2017-12-02 DIAGNOSIS — M1712 Unilateral primary osteoarthritis, left knee: Secondary | ICD-10-CM | POA: Diagnosis not present

## 2017-12-02 DIAGNOSIS — X58XXXA Exposure to other specified factors, initial encounter: Secondary | ICD-10-CM | POA: Diagnosis not present

## 2017-12-02 DIAGNOSIS — W010XXA Fall on same level from slipping, tripping and stumbling without subsequent striking against object, initial encounter: Secondary | ICD-10-CM | POA: Diagnosis not present

## 2017-12-02 DIAGNOSIS — S8992XA Unspecified injury of left lower leg, initial encounter: Secondary | ICD-10-CM | POA: Diagnosis not present

## 2017-12-02 DIAGNOSIS — M25462 Effusion, left knee: Secondary | ICD-10-CM | POA: Diagnosis not present

## 2017-12-02 DIAGNOSIS — M7042 Prepatellar bursitis, left knee: Secondary | ICD-10-CM | POA: Diagnosis not present

## 2017-12-15 DIAGNOSIS — M76899 Other specified enthesopathies of unspecified lower limb, excluding foot: Secondary | ICD-10-CM | POA: Diagnosis not present

## 2017-12-15 DIAGNOSIS — M1712 Unilateral primary osteoarthritis, left knee: Secondary | ICD-10-CM | POA: Diagnosis not present

## 2018-01-12 DIAGNOSIS — M1712 Unilateral primary osteoarthritis, left knee: Secondary | ICD-10-CM | POA: Diagnosis not present

## 2018-01-12 DIAGNOSIS — M76899 Other specified enthesopathies of unspecified lower limb, excluding foot: Secondary | ICD-10-CM | POA: Diagnosis not present

## 2018-04-19 DIAGNOSIS — R809 Proteinuria, unspecified: Secondary | ICD-10-CM | POA: Diagnosis not present

## 2018-04-19 DIAGNOSIS — N289 Disorder of kidney and ureter, unspecified: Secondary | ICD-10-CM | POA: Diagnosis not present

## 2018-04-19 DIAGNOSIS — I129 Hypertensive chronic kidney disease with stage 1 through stage 4 chronic kidney disease, or unspecified chronic kidney disease: Secondary | ICD-10-CM | POA: Diagnosis not present

## 2018-04-19 DIAGNOSIS — N184 Chronic kidney disease, stage 4 (severe): Secondary | ICD-10-CM | POA: Diagnosis not present

## 2018-04-19 DIAGNOSIS — Z6835 Body mass index (BMI) 35.0-35.9, adult: Secondary | ICD-10-CM | POA: Diagnosis not present

## 2018-04-19 DIAGNOSIS — N2581 Secondary hyperparathyroidism of renal origin: Secondary | ICD-10-CM | POA: Diagnosis not present

## 2018-04-19 DIAGNOSIS — E1122 Type 2 diabetes mellitus with diabetic chronic kidney disease: Secondary | ICD-10-CM | POA: Diagnosis not present

## 2018-04-22 ENCOUNTER — Other Ambulatory Visit: Payer: Self-pay

## 2018-04-22 ENCOUNTER — Emergency Department
Admission: EM | Admit: 2018-04-22 | Discharge: 2018-04-22 | Disposition: A | Discharge status: Home / Self Care | Payer: PPO | Attending: Emergency Medicine | Admitting: Emergency Medicine

## 2018-04-22 DIAGNOSIS — N184 Chronic kidney disease, stage 4 (severe): Secondary | ICD-10-CM | POA: Insufficient documentation

## 2018-04-22 DIAGNOSIS — Z7984 Long term (current) use of oral hypoglycemic drugs: Secondary | ICD-10-CM | POA: Diagnosis not present

## 2018-04-22 DIAGNOSIS — Y9389 Activity, other specified: Secondary | ICD-10-CM | POA: Insufficient documentation

## 2018-04-22 DIAGNOSIS — M7022 Olecranon bursitis, left elbow: Secondary | ICD-10-CM | POA: Insufficient documentation

## 2018-04-22 DIAGNOSIS — E1122 Type 2 diabetes mellitus with diabetic chronic kidney disease: Secondary | ICD-10-CM | POA: Diagnosis not present

## 2018-04-22 DIAGNOSIS — M25522 Pain in left elbow: Secondary | ICD-10-CM | POA: Diagnosis present

## 2018-04-22 DIAGNOSIS — Z87891 Personal history of nicotine dependence: Secondary | ICD-10-CM | POA: Diagnosis not present

## 2018-04-22 DIAGNOSIS — I129 Hypertensive chronic kidney disease with stage 1 through stage 4 chronic kidney disease, or unspecified chronic kidney disease: Secondary | ICD-10-CM | POA: Diagnosis not present

## 2018-04-22 DIAGNOSIS — Z79899 Other long term (current) drug therapy: Secondary | ICD-10-CM | POA: Insufficient documentation

## 2018-04-22 MED ORDER — DEXAMETHASONE SODIUM PHOSPHATE 10 MG/ML IJ SOLN
10.0000 mg | Freq: Once | INTRAMUSCULAR | Status: AC
  Administered 2018-04-22: 10 mg via INTRAMUSCULAR
  Filled 2018-04-22: qty 1

## 2018-04-22 MED ORDER — PREDNISONE 50 MG PO TABS: ORAL_TABLET | ORAL | 0 refills | Status: DC

## 2018-04-22 NOTE — ED Triage Notes (Signed)
Pt with left elbow pain, redness since yesterday. Pt denies known injury, insect bite. Pt denies fever, chills.

## 2018-04-22 NOTE — ED Notes (Signed)
Pt ambulatory to restroom

## 2018-04-22 NOTE — ED Provider Notes (Signed)
[facility] Emergency Department Provider Note  ____________________________________________  Time seen: Approximately 11:09 PM  I have reviewed the triage vital signs and the nursing notes.   HISTORY  Chief Complaint Elbow Pain    HPI Jeremy Dorsey is a 67 y.o. male presents to the emergency department with pain and mild erythema in the distribution of the olecranon bursa for the past two days.  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  He denies fever and chills.  Patient has not had excessive red meat or seafood lately.  He does not consume excessive alcohol.  He is a daily smoker.  No prior diagnosis of gout. Patient has been taking Tylenol for pain.    Past Medical History:  Diagnosis Date  . Diabetes mellitus without complication ([place]) 4174  . Hypertension 2008  . Renal failure     Patient Active Problem List   Diagnosis Date Noted  . Type 2 diabetes mellitus with stage 4 chronic kidney disease, without long-term current use of insulin ([place]) 08/11/2016  . Chronic kidney disease (CKD), stage IV (severe) ([place]) 02/26/2015  . HLD (hyperlipidemia) 02/26/2015  . BP (high blood pressure) 02/26/2015    Past Surgical History:  Procedure Laterality Date  . COLONOSCOPY  1990  . COLONOSCOPY  06/09/2011   Dr Bary Castilla  . EYE SURGERY Right    laser surgery    Prior to Admission medications   Medication Sig Start Date End Date Taking? Authorizing Provider  amLODipine (NORVASC) 10 MG tablet Take 10 mg by mouth daily.  08/10/16   [provider]  calcitRIOL (ROCALTROL) 0.25 MCG capsule Take 0.25 mcg by mouth every other day. 09/08/16   [provider]  diphenhydrAMINE (BENADRYL) 25 MG tablet Take 25 mg by mouth daily.    [provider]  erythromycin [facility]) ophthalmic ointment Place 1 application into the left eye 4 (four) times daily. 02/22/17   Carmon Ginsberg, PA  furosemide  (LASIX) 40 MG tablet Take 40 mg by mouth 2 (two) times daily.  03/30/16   [provider]  glipiZIDE (GLUCOTROL) 10 MG tablet Take 1 tablet (10 mg total) by mouth 2 (two) times daily before a meal. Take 30 minutes before a meal. 02/22/17   Carmon Ginsberg, PA  HYDROcodone-acetaminophen (NORCO/VICODIN) 5-325 MG tablet Take 1 tablet by mouth every 4 (four) hours as needed for moderate pain. 11/29/17   Cuthriell, Charline Bills, PA-C  pravastatin (PRAVACHOL) 80 MG tablet TAKE 1 TABLET (80 MG TOTAL) BY MOUTH DAILY. 08/14/17   Carmon Ginsberg, PA  predniSONE (DELTASONE) 50 MG tablet Take one 50 mg tablet once daily for the next five days. 04/22/18   Lannie Fields, PA-C  sodium bicarbonate 650 MG tablet Take 650 mg by mouth 2 (two) times daily.    [provider]    Allergies Patient has no known allergies.  Family History  Problem Relation Age of Onset  . Psoriasis Mother   . Heart failure Father     Social History Social History   Tobacco Use  . Smoking status: Former Smoker    Packs/day: 1.00    Years: 30.00    Pack years: 30.00  . Smokeless tobacco: Never Used  Substance Use Topics  . Alcohol use: No  . Drug use: No     Review of Systems  Constitutional: No fever/chills Eyes: No visual changes. No discharge ENT: No upper respiratory complaints. Cardiovascular: no chest pain. Respiratory:  no cough. No SOB. Gastrointestinal: No abdominal pain.  No nausea, no vomiting.  No diarrhea.  No constipation. Genitourinary: Negative for dysuria. No hematuria Musculoskeletal: Patient has left elbow pain. Skin: Patient has erythema over the olecranon bursa. Neurological: Negative for headaches, focal weakness or numbness.   ____________________________________________   PHYSICAL EXAM:  VITAL SIGNS: ED Triage Vitals  Enc Vitals Group     BP 04/22/18 2054 (!) 156/85     Pulse Rate 04/22/18 2054 92     Resp 04/22/18 2054 19     Temp 04/22/18 2054 98 F (36.7 C)      Temp Source 04/22/18 2054 Oral     SpO2 04/22/18 2054 98 %     Weight 04/22/18 2054 236 lb (107 kg)     Height 04/22/18 2054 5\' 10"  (1.778 m)     Head Circumference --      Peak Flow --      Pain Score 04/22/18 2104 2     Pain Loc --      Pain Edu? --      Excl. in [place]? --      Constitutional: Alert and oriented. Well appearing and in no acute distress. Eyes: Conjunctivae are normal. PERRL. EOMI. Head: Atraumatic. Cardiovascular: Normal rate, regular rhythm. Normal S1 and S2.  Good peripheral circulation. Respiratory: Normal respiratory effort without tachypnea or retractions. Lungs CTAB. Good air entry to the bases with no decreased or absent breath sounds. Musculoskeletal: Patient is able to perform full range of motion at the left elbow.  Patient has erythema over the olecranon bursa.  Tenderness is elicited with palpation over the olecranon bursa.  No puncture marks.  No overlying cellulitis, palpable induration or fluctuance.  Palpable radial pulse, left. Neurologic:  Normal speech and language. No gross focal neurologic deficits are appreciated.  Skin:  Skin is warm, dry and intact. No rash noted. Psychiatric: Mood and affect are normal. Speech and behavior are normal. Patient exhibits appropriate insight and judgement.   ____________________________________________   LABS (all labs ordered are listed, but only abnormal results are displayed)  Labs Reviewed - No data to display ____________________________________________  EKG   ____________________________________________  RADIOLOGY   No results found.  ____________________________________________    PROCEDURES  Procedure(s) performed:    Procedures    Medications  dexamethasone (DECADRON) injection 10 mg (10 mg Intramuscular Given 04/22/18 2307)     ____________________________________________   INITIAL IMPRESSION / ASSESSMENT AND PLAN / ED COURSE  Pertinent labs & imaging results that were  available during my care of the patient were reviewed by me and considered in my medical decision making (see chart for details).  Review of the [place] CSRS was performed in accordance of the [place] prior to dispensing any controlled drugs.      Assessment and plan Olecranon bursitis Differential diagnosis included septic arthritis, olecranon bursitis, cellulitis and gout Patient is able to perform full range of motion at the left elbow without joint effusion, decreasing suspicion for septic arthritis.  No overlying cellulitis.  Patient does not have a history of gout and has not had significant risk factors for gout flare. Patient presents to the emergency department with erythema and pain localized to the olecranon bursa.  History and physical exam findings are consistent with olecranon bursitis.  Patient was treated with Decadron in the emergency department and discharged with prednisone.    ____________________________________________  FINAL CLINICAL IMPRESSION(S) / ED DIAGNOSES  Final diagnoses:  Olecranon bursitis of left elbow  NEW MEDICATIONS STARTED DURING THIS VISIT:  ED Discharge Orders         Ordered    predniSONE (DELTASONE) 50 MG tablet     04/22/18 2303              This chart was dictated using voice recognition software/Dragon. Despite best efforts to proofread, errors can occur which can change the meaning. Any change was purely unintentional.    Lannie Fields, PA-C 04/22/18 2319    Nena Polio, MD 04/22/18 [phone]

## 2018-04-29 ENCOUNTER — Encounter: Payer: Self-pay | Admitting: Physician Assistant

## 2018-04-29 ENCOUNTER — Ambulatory Visit (INDEPENDENT_AMBULATORY_CARE_PROVIDER_SITE_OTHER): Payer: PPO | Admitting: Physician Assistant

## 2018-04-29 VITALS — BP 114/60 | HR 78 | Temp 97.9°F | Resp 16 | Wt 243.8 lb

## 2018-04-29 DIAGNOSIS — M7022 Olecranon bursitis, left elbow: Secondary | ICD-10-CM

## 2018-04-29 MED ORDER — CEPHALEXIN 500 MG PO CAPS: 500.0000 mg | ORAL_CAPSULE | Freq: Two times a day (BID) | ORAL | 0 refills | Status: DC

## 2018-04-29 MED ORDER — PREDNISONE 50 MG PO TABS: ORAL_TABLET | ORAL | 0 refills | Status: DC

## 2018-04-29 NOTE — Progress Notes (Signed)
Patient: Jeremy Dorsey Male    DOB: 18-Aug-1950   67 y.o.   MRN: [id number] Visit Date: 04/29/2018  Today's Provider: Mar Daring, PA-C   Chief Complaint  Patient presents with  . Hospitalization Follow-up    ER follow up   Subjective:    HPI     Follow Up ER Visit  Patient is here for ER follow up.  He was recently seen at [facility] for complaints of left elbow pain on 04/22/18. Discharge diagnosis: Olecranon bursitis of left elbow  Treatment for this included Prednisone 50mg  He reports satisfactory compliance with treatment. He reports this condition is Unchanged, patient reports swelling improved but pain in elbow is still present. Marland Kitchen ------------------------------------------------------------------------------------   No Known Allergies   Current Outpatient Medications:  .  amLODipine (NORVASC) 10 MG tablet, Take 10 mg by mouth daily. , Disp: , Rfl:  .  calcitRIOL (ROCALTROL) 0.25 MCG capsule, Take 0.25 mcg by mouth every other day., Disp: , Rfl: 6 .  diphenhydrAMINE (BENADRYL) 25 MG tablet, Take 25 mg by mouth daily., Disp: , Rfl:  .  furosemide (LASIX) 40 MG tablet, Take 40 mg by mouth 2 (two) times daily. , Disp: , Rfl:  .  glipiZIDE (GLUCOTROL) 10 MG tablet, Take 1 tablet (10 mg total) by mouth 2 (two) times daily before a meal. Take 30 minutes before a meal., Disp: 60 tablet, Rfl: 5 .  pravastatin (PRAVACHOL) 80 MG tablet, TAKE 1 TABLET (80 MG TOTAL) BY MOUTH DAILY., Disp: 90 tablet, Rfl: 3 .  sodium bicarbonate 650 MG tablet, Take 650 mg by mouth 2 (two) times daily., Disp: , Rfl:   Review of Systems  Constitutional: Negative.   HENT: Negative.   Respiratory: Negative.   Cardiovascular: Negative.   Gastrointestinal: Negative.   Musculoskeletal: Positive for arthralgias and joint swelling. Negative for back pain, gait problem, myalgias, neck pain and neck stiffness.  Skin: Negative.     Social History   Tobacco Use  . Smoking status: Former Smoker   Packs/day: 1.00    Years: 30.00    Pack years: 30.00  . Smokeless tobacco: Never Used  Substance Use Topics  . Alcohol use: No   Objective:   BP 114/60   Pulse 78   Temp 97.9 F (36.6 C) (Oral)   Resp 16   Wt 243 lb 12.8 oz (110.6 kg)   SpO2 98%   BMI 34.98 kg/m  Vitals:   04/29/18 0816  BP: 114/60  Pulse: 78  Resp: 16  Temp: 97.9 F (36.6 C)  TempSrc: Oral  SpO2: 98%  Weight: 243 lb 12.8 oz (110.6 kg)     Physical Exam  Constitutional: He appears well-developed and well-nourished. No distress.  HENT:  Head: Normocephalic and atraumatic.  Eyes: Conjunctivae and EOM are normal.  Neck: Normal range of motion. Neck supple.  Pulmonary/Chest: Effort normal. No respiratory distress.  Musculoskeletal:       Right elbow: Normal.      Left elbow: He exhibits decreased range of motion and swelling. Tenderness found. Olecranon process tenderness noted.       Arms: Psychiatric: He has a normal mood and affect. His behavior is normal. Judgment and thought content normal.  Vitals reviewed.       Assessment & Plan:     1. Olecranon bursitis of left elbow Still having pain, swelling, and warmth over left olecranon bursa. Will give more prednisone and add antibiotic as below since there are signs of overlying cellulitis.  He is to call if no improvements.  - predniSONE (DELTASONE) 50 MG tablet; Take one 50 mg tablet once daily for the next five days.  Dispense: 5 tablet; Refill: 0 - cephALEXin (KEFLEX) 500 MG capsule; Take 1 capsule (500 mg total) by mouth 2 (two) times daily.  Dispense: 10 capsule; Refill: 0       Mar Daring, PA-C  [facility]

## 2018-06-08 NOTE — Progress Notes (Signed)
Patient: Jeremy Dorsey Male    DOB: [birth year]   67 y.o.   MRN: [id number] Visit Date: 06/10/2018  Today's Provider: Mar Daring, PA-C   Chief Complaint  Patient presents with  . Fall   Subjective:     Fall Patient presents today for fall that occurred 1 month ago. He was walking up the steps when he fell. He put out his right arm to help catch his fall. He heard a pop in his shoulder with raising his arm. Patient states he is having upper right shoulder pain and has been taking Tylenol for the pain. He also has decreased ROM in the right shoulder. Unable to actively lift in adbuction or forward flexion greater than 60-70 degrees. This has been progressively worsening since the fall. He can raise the arm completely passively using the left hand to lift.      No Known Allergies   Current Outpatient Medications:  .  amLODipine (NORVASC) 10 MG tablet, Take 10 mg by mouth daily. , Disp: , Rfl:  .  calcitRIOL (ROCALTROL) 0.25 MCG capsule, Take 0.25 mcg by mouth every other day., Disp: , Rfl: 6 .  cephALEXin (KEFLEX) 500 MG capsule, Take 1 capsule (500 mg total) by mouth 2 (two) times daily., Disp: 10 capsule, Rfl: 0 .  diphenhydrAMINE (BENADRYL) 25 MG tablet, Take 25 mg by mouth daily., Disp: , Rfl:  .  furosemide (LASIX) 40 MG tablet, Take 40 mg by mouth 2 (two) times daily. , Disp: , Rfl:  .  glipiZIDE (GLUCOTROL) 10 MG tablet, Take 1 tablet (10 mg total) by mouth 2 (two) times daily before a meal. Take 30 minutes before a meal., Disp: 60 tablet, Rfl: 5 .  pravastatin (PRAVACHOL) 80 MG tablet, TAKE 1 TABLET (80 MG TOTAL) BY MOUTH DAILY., Disp: 90 tablet, Rfl: 3 .  predniSONE (DELTASONE) 50 MG tablet, Take one 50 mg tablet once daily for the next five days., Disp: 5 tablet, Rfl: 0 .  sodium bicarbonate 650 MG tablet, Take 650 mg by mouth 2 (two) times daily., Disp: , Rfl:   Review of Systems  Constitutional: Negative.   HENT: Negative.   Respiratory: Negative.     Gastrointestinal: Negative.   Genitourinary: Negative.   Musculoskeletal: Positive for arthralgias, joint swelling and myalgias.  Allergic/Immunologic: Negative.   Neurological: Positive for weakness. Negative for numbness.  Psychiatric/Behavioral: Negative.     Social History   Tobacco Use  . Smoking status: Former Smoker    Packs/day: 1.00    Years: 30.00    Pack years: 30.00  . Smokeless tobacco: Never Used  Substance Use Topics  . Alcohol use: No   Objective:   BP 128/88 (BP Location: Left Arm, Patient Position: Sitting, Cuff Size: Normal)   Pulse 90   Temp 98.7 F (37.1 C) (Oral)   Wt 244 lb 6.4 oz (110.9 kg)   SpO2 99%   BMI 35.07 kg/m  Vitals:   06/10/18 1218  BP: 128/88  Pulse: 90  Temp: 98.7 F (37.1 C)  TempSrc: Oral  SpO2: 99%  Weight: 244 lb 6.4 oz (110.9 kg)     Physical Exam  Constitutional: He appears well-developed and well-nourished. No distress.  HENT:  Head: Normocephalic and atraumatic.  Neck: Normal range of motion. Neck supple.  Cardiovascular: Normal rate, regular rhythm and normal heart sounds. Exam reveals no gallop and no friction rub.  No murmur heard. Pulmonary/Chest: Effort normal and breath sounds normal. No  respiratory distress. He has no wheezes. He has no rales.  Musculoskeletal:       Right shoulder: He exhibits decreased range of motion (AROM abduction can only go to about 6-70 degrees, forward flexion similar), tenderness and decreased strength (abduction, IR, ER). He exhibits no bony tenderness, no swelling, no spasm and normal pulse.       Left shoulder: Normal.  Skin: He is not diaphoretic.  Vitals reviewed.   CLINICAL DATA: Fell 1 month ago  EXAM: RIGHT SHOULDER - 2+ VIEW  COMPARISON: None.  FINDINGS: No fracture or dislocation. Possible widening of the [facility] joint. Mild calcific tendinitis.  IMPRESSION: 1. No fracture 2. Possible mild widening/injury to the [facility] joint 3. Mild calcific  tendinitis   Electronically Signed By: Donavan Foil M.D. On: 06/10/2018 17:00    Assessment & Plan:     1. Fall, initial encounter Will get imaging of the right shoulder. Suspect partial to complete rotator cuff tear due to ROM limitations. Less likely adhesive capsulitis since patient can passively (using his left arm) get to normal ROM. I will refer to Dr. Mack Guise for further evaluation.  - DG Shoulder Right; Future  2. Acute pain of right shoulder See above medical treatment plan. - DG Shoulder Right; Future  3. Type 2 diabetes mellitus without complication, without long-term current use of insulin (HCC) A1c improved to 8.0 from 9.1.  - POCT glycosylated hemoglobin (Hb A1C)  4. Need for influenza vaccination Flu vaccine given today without complication. Patient sat upright for 15 minutes to check for adverse reaction before being released. - Flu vaccine HIGH DOSE PF  5. Injury of muscle or tendon of rotator cuff, initial encounter Suspect rotator cuff tear of the right shoulder due to mechanism of injury during fall and now decreased ROM.  - Ambulatory referral to [place], PA-C  [facility]

## 2018-06-10 ENCOUNTER — Ambulatory Visit (INDEPENDENT_AMBULATORY_CARE_PROVIDER_SITE_OTHER): Payer: PPO | Admitting: Physician Assistant

## 2018-06-10 ENCOUNTER — Encounter: Payer: Self-pay | Admitting: Physician Assistant

## 2018-06-10 ENCOUNTER — Ambulatory Visit
Admission: RE | Admit: 2018-06-10 | Discharge: 2018-06-10 | Disposition: A | Discharge status: Home / Self Care | Payer: PPO | Source: Ambulatory Visit | Attending: Physician Assistant | Admitting: Physician Assistant

## 2018-06-10 ENCOUNTER — Telehealth: Payer: Self-pay

## 2018-06-10 VITALS — BP 128/88 | HR 90 | Temp 98.7°F | Wt 244.4 lb

## 2018-06-10 DIAGNOSIS — M25511 Pain in right shoulder: Secondary | ICD-10-CM | POA: Diagnosis not present

## 2018-06-10 DIAGNOSIS — M7531 Calcific tendinitis of right shoulder: Secondary | ICD-10-CM | POA: Insufficient documentation

## 2018-06-10 DIAGNOSIS — Z23 Encounter for immunization: Secondary | ICD-10-CM

## 2018-06-10 DIAGNOSIS — S46009A Unspecified injury of muscle(s) and tendon(s) of the rotator cuff of unspecified shoulder, initial encounter: Secondary | ICD-10-CM | POA: Diagnosis not present

## 2018-06-10 DIAGNOSIS — W19XXXA Unspecified fall, initial encounter: Secondary | ICD-10-CM

## 2018-06-10 DIAGNOSIS — E119 Type 2 diabetes mellitus without complications: Secondary | ICD-10-CM

## 2018-06-10 LAB — POCT GLYCOSYLATED HEMOGLOBIN (HGB A1C)
Est. average glucose Bld gHb Est-mCnc: 183
Hemoglobin A1C: 8 % — AB (ref 4.0–5.6)

## 2018-06-10 NOTE — Telephone Encounter (Signed)
LMTCB

## 2018-06-10 NOTE — Telephone Encounter (Signed)
-----   Message from Mar Daring, [place] sent at 06/10/2018  5:16 PM EST ----- There is some widening of the acromioclavicular [facility]) joint noted. Also some calcium deposits noted on the tendons (it appears mild but can cause pain). I will refer to Dr. Mack Guise as we discussed in the office visit.

## 2018-06-11 NOTE — Telephone Encounter (Signed)
Pt advised of xray results and referral

## 2018-06-11 NOTE — Telephone Encounter (Signed)
Pt returned call about his Xray results and request call back. Pt stated if he doesn't answer to please leave detailed message with the results on answering machine. Please advise. Thanks TNP

## 2018-06-13 ENCOUNTER — Encounter: Payer: Self-pay | Admitting: Physician Assistant

## 2018-06-22 NOTE — Telephone Encounter (Signed)
Pt returning missed call.   Please call pt back if needed.  Pt also has had his flu shot.  Letting office know so he no longer gets the call.  Thanks, [employer]

## 2018-06-24 DIAGNOSIS — M25511 Pain in right shoulder: Secondary | ICD-10-CM | POA: Diagnosis not present

## 2018-06-28 ENCOUNTER — Other Ambulatory Visit: Payer: Self-pay | Admitting: Orthopedic Surgery

## 2018-06-28 DIAGNOSIS — M25511 Pain in right shoulder: Secondary | ICD-10-CM

## 2018-07-19 ENCOUNTER — Ambulatory Visit
Admission: RE | Admit: 2018-07-19 | Discharge: 2018-07-19 | Disposition: A | Discharge status: Home / Self Care | Payer: PPO | Source: Ambulatory Visit | Attending: Orthopedic Surgery | Admitting: Orthopedic Surgery

## 2018-07-19 DIAGNOSIS — M25511 Pain in right shoulder: Secondary | ICD-10-CM

## 2018-07-19 DIAGNOSIS — M75121 Complete rotator cuff tear or rupture of right shoulder, not specified as traumatic: Secondary | ICD-10-CM | POA: Insufficient documentation

## 2018-07-19 DIAGNOSIS — S4991XA Unspecified injury of right shoulder and upper arm, initial encounter: Secondary | ICD-10-CM | POA: Diagnosis not present

## 2018-07-19 MED ORDER — LIDOCAINE HCL (PF) 1 % IJ SOLN
5.0000 mL | Freq: Once | INTRAMUSCULAR | Status: AC
  Administered 2018-07-19: 5 mL
  Filled 2018-07-19: qty 5

## 2018-07-19 MED ORDER — GADOBUTROL 1 MMOL/ML IV SOLN
0.0500 mL | Freq: Once | INTRAVENOUS | Status: AC | PRN
  Administered 2018-07-19: 0.05 mL

## 2018-07-19 MED ORDER — IOPAMIDOL (ISOVUE-300) INJECTION 61%
7.0000 mL | Freq: Once | INTRAVENOUS | Status: AC | PRN
  Administered 2018-07-19: 7 mL

## 2018-07-19 MED ORDER — SODIUM CHLORIDE (PF) 0.9 % IJ SOLN
12.0000 mL | Freq: Once | INTRAMUSCULAR | Status: AC
  Administered 2018-07-19: 12 mL

## 2018-07-22 DIAGNOSIS — R300 Dysuria: Secondary | ICD-10-CM | POA: Diagnosis not present

## 2018-07-23 ENCOUNTER — Emergency Department: Payer: PPO

## 2018-07-23 ENCOUNTER — Emergency Department
Admission: EM | Admit: 2018-07-23 | Discharge: 2018-07-24 | Disposition: A | Discharge status: Home / Self Care | Payer: PPO | Attending: Emergency Medicine | Admitting: Emergency Medicine

## 2018-07-23 DIAGNOSIS — Z79899 Other long term (current) drug therapy: Secondary | ICD-10-CM | POA: Insufficient documentation

## 2018-07-23 DIAGNOSIS — Z87891 Personal history of nicotine dependence: Secondary | ICD-10-CM | POA: Diagnosis not present

## 2018-07-23 DIAGNOSIS — N3289 Other specified disorders of bladder: Secondary | ICD-10-CM

## 2018-07-23 DIAGNOSIS — I129 Hypertensive chronic kidney disease with stage 1 through stage 4 chronic kidney disease, or unspecified chronic kidney disease: Secondary | ICD-10-CM | POA: Insufficient documentation

## 2018-07-23 DIAGNOSIS — E875 Hyperkalemia: Secondary | ICD-10-CM | POA: Diagnosis not present

## 2018-07-23 DIAGNOSIS — E1122 Type 2 diabetes mellitus with diabetic chronic kidney disease: Secondary | ICD-10-CM | POA: Diagnosis not present

## 2018-07-23 DIAGNOSIS — Z7984 Long term (current) use of oral hypoglycemic drugs: Secondary | ICD-10-CM | POA: Diagnosis not present

## 2018-07-23 DIAGNOSIS — N184 Chronic kidney disease, stage 4 (severe): Secondary | ICD-10-CM | POA: Diagnosis not present

## 2018-07-23 DIAGNOSIS — R300 Dysuria: Secondary | ICD-10-CM | POA: Diagnosis not present

## 2018-07-23 DIAGNOSIS — R109 Unspecified abdominal pain: Secondary | ICD-10-CM | POA: Diagnosis not present

## 2018-07-23 DIAGNOSIS — N329 Bladder disorder, unspecified: Secondary | ICD-10-CM | POA: Insufficient documentation

## 2018-07-23 DIAGNOSIS — R1031 Right lower quadrant pain: Secondary | ICD-10-CM | POA: Diagnosis present

## 2018-07-23 DIAGNOSIS — N132 Hydronephrosis with renal and ureteral calculous obstruction: Secondary | ICD-10-CM | POA: Diagnosis not present

## 2018-07-23 LAB — URINALYSIS, COMPLETE (UACMP) WITH MICROSCOPIC
Bacteria, UA: NONE SEEN
Bilirubin Urine: NEGATIVE
Glucose, UA: NEGATIVE mg/dL
Ketones, ur: NEGATIVE mg/dL
Leukocytes, UA: NEGATIVE
Nitrite: NEGATIVE
Protein, ur: 100 mg/dL — AB
Specific Gravity, Urine: 1.008 (ref 1.005–1.030)
Squamous Epithelial / LPF: NONE SEEN (ref 0–5)
pH: 6 (ref 5.0–8.0)

## 2018-07-23 LAB — CBC
HCT: 39.1 % (ref 39.0–52.0)
Hemoglobin: 12.6 g/dL — ABNORMAL LOW (ref 13.0–17.0)
MCH: 30.2 pg (ref 26.0–34.0)
MCHC: 32.2 g/dL (ref 30.0–36.0)
MCV: 93.8 fL (ref 80.0–100.0)
Platelets: 209 10*3/uL (ref 150–400)
RBC: 4.17 MIL/uL — ABNORMAL LOW (ref 4.22–5.81)
RDW: 12.7 % (ref 11.5–15.5)
WBC: 12.1 10*3/uL — ABNORMAL HIGH (ref 4.0–10.5)
nRBC: 0 % (ref 0.0–0.2)

## 2018-07-23 LAB — BASIC METABOLIC PANEL
Anion gap: 10 (ref 5–15)
BUN: 82 mg/dL — ABNORMAL HIGH (ref 8–23)
CO2: 14 mmol/L — ABNORMAL LOW (ref 22–32)
Calcium: 8.8 mg/dL — ABNORMAL LOW (ref 8.9–10.3)
Chloride: 116 mmol/L — ABNORMAL HIGH (ref 98–111)
Creatinine, Ser: 5.62 mg/dL — ABNORMAL HIGH (ref 0.61–1.24)
GFR calc Af Amer: 11 mL/min — ABNORMAL LOW (ref >60)
GFR calc non Af Amer: 10 mL/min — ABNORMAL LOW (ref >60)
Glucose, Bld: 127 mg/dL — ABNORMAL HIGH (ref 70–99)
Potassium: 6 mmol/L — ABNORMAL HIGH (ref 3.5–5.1)
Sodium: 140 mmol/L (ref 135–145)

## 2018-07-23 LAB — MAGNESIUM: Magnesium: 1.8 mg/dL (ref 1.7–2.4)

## 2018-07-23 NOTE — ED Triage Notes (Signed)
Patient c/o right flank pain X 3 days. Patient reports urinary urgency, decreased force of urinary stream, painful urination, and urinary retention.

## 2018-07-23 NOTE — ED Provider Notes (Signed)
[facility] Emergency Department Provider Note  ____________________________________________   First MD Initiated Contact with Patient 07/23/18 2259     (approximate)  I have reviewed the triage vital signs and the nursing notes.   HISTORY  Chief Complaint Flank Pain    HPI Jeremy Dorsey is a 67 y.o. male with a history of stage IV kidney disease who is on the transplant list at [facility].  He also has a history of kidney stones. See his past medical and active problem list below for details.  He presents tonight by private vehicle for evaluation of about 3 days of intermittent but persistent pain in his right flank.  It is nonradiating, sharp, and anywhere from mild to severe at times.  It does feel similar to prior kidney stone pain he has had although earlier this year it was on the left side.  In spite of his kidney disease he still urinates normally except for the last 3 days he has had urinary hesitancy and he feels like his stream cuts off soon after beginning.  The urination is accompanied with sharp stabbing and burning pain which persists for at least a minute after he stops urinating.  He has seen no gross hematuria.  He denies fever/chills, chest pain, shortness of breath.  He has had some nausea but no vomiting.  He denies diarrhea.  He has a nephrologist in [place].    Past Medical History:  Diagnosis Date  . Diabetes mellitus without complication ([place]) 9233  . Hypertension 2008  . Renal failure     Patient Active Problem List   Diagnosis Date Noted  . Cataracts, bilateral 04/29/2017  . Kidney stones 04/29/2017  . Malignant melanoma of face excluding eyelid, nose, lip, and ear ([place]) 04/29/2017  . Obesity (BMI 35.0-39.9 without comorbidity) 04/29/2017  . Pre-transplant evaluation for CKD (chronic kidney disease) 04/29/2017  . Secondary hyperparathyroidism of renal origin ([place]) 04/29/2017  . Type 2 diabetes mellitus, without  long-term current use of insulin ([place]) 08/11/2016  . Chronic kidney disease (CKD), stage IV (severe) ([place]) 02/26/2015  . HLD (hyperlipidemia) 02/26/2015  . BP (high blood pressure) 02/26/2015    Past Surgical History:  Procedure Laterality Date  . COLONOSCOPY  1990  . COLONOSCOPY  06/09/2011   Dr Bary Castilla  . EYE SURGERY Right    laser surgery    Prior to Admission medications   Medication Sig Start Date End Date Taking? Authorizing Provider  amLODipine (NORVASC) 10 MG tablet Take 10 mg by mouth daily.  08/10/16   [provider]  calcitRIOL (ROCALTROL) 0.25 MCG capsule Take 0.25 mcg by mouth every other day. 09/08/16   [provider]  cephALEXin (KEFLEX) 500 MG capsule Take 1 capsule (500 mg total) by mouth 2 (two) times daily. 04/29/18   Mar Daring, PA-C  diphenhydrAMINE (BENADRYL) 25 MG tablet Take 25 mg by mouth daily.    [provider]  docusate sodium (COLACE) 100 MG capsule Take 1 tablet once or twice daily as needed for constipation while taking narcotic pain medicine 07/24/18   Hinda Kehr, MD  furosemide (LASIX) 40 MG tablet Take 40 mg by mouth 2 (two) times daily.  03/30/16   [provider]  glipiZIDE (GLUCOTROL) 10 MG tablet Take 1 tablet (10 mg total) by mouth 2 (two) times daily before a meal. Take 30 minutes before a meal. 02/22/17   Carmon Ginsberg, PA  HYDROcodone-acetaminophen (NORCO/VICODIN) 5-325 MG tablet Take 1-2 tablets by  mouth every 6 (six) hours as needed for moderate pain. 07/24/18   Hinda Kehr, MD  ondansetron (ZOFRAN ODT) 4 MG disintegrating tablet Allow 1-2 tablets to dissolve in your mouth every 8 hours as needed for nausea/vomiting 07/24/18   Hinda Kehr, MD  pravastatin (PRAVACHOL) 80 MG tablet TAKE 1 TABLET (80 MG TOTAL) BY MOUTH DAILY. 08/14/17   Carmon Ginsberg, PA  predniSONE (DELTASONE) 50 MG tablet Take one 50 mg tablet once daily for the next five days. 04/29/18   Mar Daring, PA-C  sodium  bicarbonate 650 MG tablet Take 650 mg by mouth 2 (two) times daily.    [provider]    Allergies Patient has no known allergies.  Family History  Problem Relation Age of Onset  . Psoriasis Mother   . Heart failure Father     Social History Social History   Tobacco Use  . Smoking status: Former Smoker    Packs/day: 1.00    Years: 30.00    Pack years: 30.00  . Smokeless tobacco: Never Used  Substance Use Topics  . Alcohol use: No  . Drug use: No    Review of Systems Constitutional: No fever/chills Eyes: No visual changes. ENT: No sore throat. Cardiovascular: Denies chest pain. Respiratory: Denies shortness of breath. Gastrointestinal: No abdominal pain.  Nausea, no vomiting.  No diarrhea.  No constipation. Genitourinary: Dysuria, urinary hesitancy, no gross hematuria Musculoskeletal: Right lower flank pain.  No other extremity concerns nor abnormalities. Integumentary: Negative for rash. Neurological: Negative for headaches, focal weakness or numbness.   ____________________________________________   PHYSICAL EXAM:  VITAL SIGNS: ED Triage Vitals  Enc Vitals Group     BP 07/23/18 2150 (!) 172/87     Pulse Rate 07/23/18 2150 99     Resp 07/23/18 2150 18     Temp 07/23/18 2150 97.6 F (36.4 C)     Temp Source 07/23/18 2150 Oral     SpO2 07/23/18 2150 99 %     Weight 07/23/18 2148 108 kg (238 lb)     Height 07/23/18 2148 1.778 m (5\' 10" )     Head Circumference --      Peak Flow --      Pain Score 07/23/18 2147 6     Pain Loc --      Pain Edu? --      Excl. in [place]? --     Constitutional: Alert and oriented. Well appearing and in no acute distress. Eyes: Conjunctivae are normal.  Head: Atraumatic. Nose: No congestion/rhinnorhea. Mouth/Throat: Mucous membranes are moist. Neck: No stridor.  No meningeal signs.   Cardiovascular: Normal rate, regular rhythm. Good peripheral circulation. Grossly normal heart sounds. Respiratory: Normal respiratory  effort.  No retractions. Lungs CTAB. Gastrointestinal: Soft and nontender. No distention.  Musculoskeletal: Right CVA tenderness to percussion.  No lower extremity tenderness nor edema. No gross deformities of extremities. Neurologic:  Normal speech and language. No gross focal neurologic deficits are appreciated.  Skin:  Skin is warm, dry and intact. No rash noted. Psychiatric: Mood and affect are normal. Speech and behavior are normal.  ____________________________________________   LABS (all labs ordered are listed, but only abnormal results are displayed)  Labs Reviewed  URINALYSIS, COMPLETE (UACMP) WITH MICROSCOPIC - Abnormal; Notable for the following components:      Result Value   Color, Urine COLORLESS (*)    APPearance CLEAR (*)    Hgb urine dipstick MODERATE (*)    Protein, ur 100 (*)  All other components within normal limits  CBC - Abnormal; Notable for the following components:   WBC 12.1 (*)    RBC 4.17 (*)    Hemoglobin 12.6 (*)    All other components within normal limits  BASIC METABOLIC PANEL - Abnormal; Notable for the following components:   Potassium 6.0 (*)    Chloride 116 (*)    CO2 14 (*)    Glucose, Bld 127 (*)    BUN 82 (*)    Creatinine, Ser 5.62 (*)    Calcium 8.8 (*)    GFR calc non Af Amer 10 (*)    GFR calc Af Amer 11 (*)    All other components within normal limits  MAGNESIUM   ____________________________________________  EKG  ED ECG REPORT I, Hinda Kehr, the attending physician, personally viewed and interpreted this ECG.  Date: 07/23/2018 EKG Time: 23: 34 Rate: 89 Rhythm: normal sinus rhythm QRS Axis: normal Intervals: normal ST/T Wave abnormalities: normal Narrative Interpretation: no evidence of acute ischemia  ____________________________________________  RADIOLOGY   ED MD interpretation: Mild right hydroureter and hydronephrosis without obvious stones.  There is also an area of bladder wall thickening which is  concerning for the possibility of an invasive tumor versus localized cystitis.  Official radiology report(s): Ct Renal Stone Study  Result Date: 07/23/2018 CLINICAL DATA:  Flank pain with hematuria EXAM: CT ABDOMEN AND PELVIS WITHOUT CONTRAST TECHNIQUE: Multidetector CT imaging of the abdomen and pelvis was performed following the standard protocol without IV contrast. COMPARISON:  CT 09/26/2016 FINDINGS: Lower chest: Lung bases demonstrate no acute consolidation or effusion. The heart size is normal. Hepatobiliary: No focal liver abnormality is seen. No gallstones, gallbladder wall thickening, or biliary dilatation. Pancreas: Unremarkable. No pancreatic ductal dilatation or surrounding inflammatory changes. Spleen: Normal in size without focal abnormality. Adrenals/Urinary Tract: Adrenal glands are normal. Multiple low-attenuation masses in the kidneys, likely represent cysts. Punctate stone lower pole left kidney. 9 mm hyperdense focus mid left kidney possible hemorrhagic or proteinaceous cyst. Mild right hydronephrosis and hydroureter. No definitive stone is seen. Asymmetric wall thickening involving the right posterior bladder. Edema and soft tissue stranding adjacent to the bladder and distal ureter on the right side. Stomach/Bowel: Stomach is within normal limits. Appendix appears normal. No evidence of bowel wall thickening, distention, or inflammatory changes. Vascular/Lymphatic: Moderate aortic atherosclerosis. No aneurysm. No significantly enlarged lymph nodes. Reproductive: Prostate is unremarkable. Other: No free air or free fluid. Musculoskeletal: No acute or suspicious osseous abnormality IMPRESSION: 1. Mild right hydronephrosis and hydroureter but without definitive stones seen. There is asymmetric wall thickening of the right posterior bladder near the ureteral insertion with edema in the fat adjacent to the bladder and distal right ureter. Uncertain if the findings are secondary to focal  cystitis versus infiltrative mass. Consider correlation with direct visualization. 2. Punctate stone in the left kidney. Hyperdense focus mid left kidney may reflect hemorrhagic or proteinaceous cysts. Other low-attenuation foci in the kidneys likely represent cysts. Electronically Signed   By: Donavan Foil M.D.   On: 07/23/2018 23:28    ____________________________________________   PROCEDURES  Critical Care performed: No   Procedure(s) performed:   Procedures   ____________________________________________   INITIAL IMPRESSION / ASSESSMENT AND PLAN / ED COURSE  As part of my medical decision making, I reviewed the following data within the [facility] notes reviewed and incorporated, Labs reviewed , EKG interpreted , Old chart reviewed, Notes from prior ED visits and [place] Controlled Substance  Database    Differential diagnosis includes, but is not limited to, renal colic, urethral stone, UTI/pyelonephritis, nonspecific intraparenchymal kidney disease, renal infarction.  The patient is in no distress and is more concerned about the intermittent but persistent nature of the symptoms than any current pain.  He does not require analgesia nor antibiotics at this time.  His lab work is notable for a creatinine of 5.62 which actually is consistent with his prior lab values at [facility].  Looking through his chart at care everywhere records his creatinine varies anywhere between the threes and the 5-6 range.  Notably his potassium is 6.0 which could indicate some worsening kidney function even though it is difficult to quantify given his GFR of around 10.  His urinalysis is notable for protein, some red cells and some white cells and positive for moderate hemoglobin but no obvious or gross infection.  CBC is notable for an essentially normal hemoglobin and hematocrit but a slight leukocytosis of 12.1.  His vital signs are stable except for hypertension.  I am sending him for a  CT renal stone protocol of the abdomen and pelvis and will reassess.  I will give him a liter of fluid and we will recheck a potassium.  Clinical Course as of Jul 24 116  Nancy Fetter Jul 24, 2018  0039 The patient does not have an obvious stone on his scan but he has some mild hydroureter and hydronephrosis on the right side.  Additionally he has an area of thickening in his bladder that is most concerning, under the circumstances, for the possibility of a tumor.  I updated the patient about the results and explained that I think most likely he had a stone that he has passed and he is having some residual discomfort.  However I explained to him about the possibility of a tumor.  He has had a cystoscopy in the past and said it was normal but it is unclear how long ago that was.  I stressed to him the importance of close follow-up with urology for direct visualization of the bladder wall.  He understands.  He sees a urologist in [place] but he would prefer to follow-up in [place] so I will give him information about following up with Dr. Erlene Quan and her colleagues.  I also informed him about the potassium of 6.0 in the normal EKG.  I offered treatment for the hyperkalemia, specifically I was going to give him fluids to see if we could dilute it without aggressively treating with medication given that he has stage IV chronic kidney disease and looking back through the record I see that he frequently has a potassium in the upper 5 range.  He would prefer not to do any IV fluids at this time and promises to drink plenty of fluids at home and to avoid foods containing potassium.  Given that he is having no other symptoms and has a normal EKG, I think this is appropriate, but gave him strict return precautions.   [CF]    Clinical Course User Index [CF] Hinda Kehr, MD    ____________________________________________  FINAL CLINICAL IMPRESSION(S) / ED DIAGNOSES  Final diagnoses:  Dysuria  Right flank pain   Bladder wall thickening  Hyperkalemia  Stage 4 chronic kidney disease ([place])     MEDICATIONS GIVEN DURING THIS VISIT:  Medications  HYDROcodone-acetaminophen (NORCO/VICODIN) 5-325 MG per tablet 2 tablet (2 tablets Oral Given 07/24/18 0109)     ED Discharge Orders  Ordered    HYDROcodone-acetaminophen (NORCO/VICODIN) 5-325 MG tablet  Every 6 hours PRN     07/24/18 0044    ondansetron (ZOFRAN ODT) 4 MG disintegrating tablet     07/24/18 0044    docusate sodium (COLACE) 100 MG capsule     07/24/18 0044           Note:  This document was prepared using Dragon voice recognition software and may include unintentional dictation errors.    Hinda Kehr, MD 07/24/18 [phone]

## 2018-07-24 MED ORDER — DOCUSATE SODIUM 100 MG PO CAPS: ORAL_CAPSULE | ORAL | 0 refills | Status: DC

## 2018-07-24 MED ORDER — HYDROCODONE-ACETAMINOPHEN 5-325 MG PO TABS
2.0000 | ORAL_TABLET | Freq: Once | ORAL | Status: AC
  Administered 2018-07-24: 2 via ORAL
  Filled 2018-07-24: qty 2

## 2018-07-24 MED ORDER — ONDANSETRON 4 MG PO TBDP: ORAL_TABLET | ORAL | 0 refills | Status: DC

## 2018-07-24 MED ORDER — HYDROCODONE-ACETAMINOPHEN 5-325 MG PO TABS: 1.0000 | ORAL_TABLET | Freq: Four times a day (QID) | ORAL | 0 refills | Status: DC | PRN

## 2018-07-24 NOTE — Discharge Instructions (Addendum)
As we discussed, based on your work-up, I believe that you had a kidney stone on the right that you had since passed, but you are continuing to have residual pain due to the irritation the passage of the stone caused.  However, you have an area of thickening in your bladder that is concerning for the possibility of a tumor that cannot be adequately seen with CT scan.  The recommendation is that you have a cystoscopy for direct visualization of your bladder.  You indicated that you would rather follow-up in [place] if possible, so I provided you the name and number of Dr. Erlene Quan at [facility].  If you call on Monday, either she or 1 of her colleagues should be able to see you relatively soon for further evaluation and follow-up from this emergency department visit.  Remember that your potassium was also elevated.  Given your chronic kidney disease, it is important that you avoid potassium containing foods.  Please refer to the dietary guidelines provided in this information.  Please avoid NSAIDs (ibuprofen, naproxen, aspirin, etc.).  You may take Tylenol as needed for pain. Take Norco as prescribed for severe pain. Do not drink alcohol, drive or participate in any other potentially dangerous activities while taking this medication as it may make you sleepy. Do not take this medication with any other sedating medications, either prescription or over-the-counter. If you were prescribed Percocet or Vicodin, do not take these with acetaminophen (Tylenol) as it is already contained within these medications.   This medication is an opiate (or narcotic) pain medication and can be habit forming.  Use it as little as possible to achieve adequate pain control.  Do not use or use it with extreme caution if you have a history of opiate abuse or dependence.  If you are on a pain contract with your primary care doctor or a pain specialist, be sure to let them know you were prescribed this medication  today from the [facility] Emergency Department.  This medication is intended for your use only - do not give any to anyone else and keep it in a secure place where nobody else, especially children, have access to it.  It will also cause or worsen constipation, so you may want to consider taking an over-the-counter stool softener while you are taking this medication.    Return to the emergency department if you develop new or worsening symptoms that concern you.

## 2018-07-26 DIAGNOSIS — N189 Chronic kidney disease, unspecified: Secondary | ICD-10-CM | POA: Diagnosis not present

## 2018-07-28 ENCOUNTER — Ambulatory Visit: Payer: PPO | Admitting: Urology

## 2018-07-28 ENCOUNTER — Telehealth: Payer: Self-pay | Admitting: Radiology

## 2018-07-28 ENCOUNTER — Other Ambulatory Visit: Payer: Self-pay | Admitting: Radiology

## 2018-07-28 ENCOUNTER — Encounter: Payer: Self-pay | Admitting: Urology

## 2018-07-28 VITALS — BP 157/88 | HR 87 | Ht 70.0 in | Wt 241.2 lb

## 2018-07-28 DIAGNOSIS — R300 Dysuria: Secondary | ICD-10-CM

## 2018-07-28 DIAGNOSIS — N200 Calculus of kidney: Secondary | ICD-10-CM | POA: Diagnosis not present

## 2018-07-28 DIAGNOSIS — N1330 Unspecified hydronephrosis: Secondary | ICD-10-CM

## 2018-07-28 LAB — URINALYSIS, COMPLETE
Bilirubin, UA: NEGATIVE
Ketones, UA: NEGATIVE
Nitrite, UA: NEGATIVE
Specific Gravity, UA: 1.015 (ref 1.005–1.030)
Urobilinogen, Ur: 0.2 mg/dL (ref 0.2–1.0)
pH, UA: 6 (ref 5.0–7.5)

## 2018-07-28 LAB — MICROSCOPIC EXAMINATION: Epithelial Cells (non renal): NONE SEEN /hpf (ref 0–10)

## 2018-07-28 LAB — BLADDER SCAN AMB NON-IMAGING

## 2018-07-28 NOTE — Progress Notes (Signed)
07/28/2018 6:15 PM   Jeremy Dorsey 1951/01/10 [id number]  Referring provider: Mar Daring, PA-C [address] [place], [place] [address]  CC: Urinary frequency, dysuria  HPI: I saw Jeremy Dorsey in urology clinic today in consultation for dysuria and urinary frequency.  He is a 67 year old male with diabetes and chronic kidney disease with baseline creatinine of 5.6, eGFR 10 who presented to the emergency department on 07/23/2018 with acute onset of right-sided flank pain, urinary frequency, and dysuria.  The dysuria is severe in nature and at the end of his stream.  His urinary frequency is every 30 minutes to an hour during the day.  Urinalysis in the emergency department on 12/21 showed 21-50 RBCs, 6-10 WBCs, no bacteria, nitrite negative.  CT scan without contrast was performed that showed mild right hydronephrosis and hydroureter with no definitive stone seen.  There was asymmetric wall thickening of the right posterior bladder near the ureteral orifice concerning for possible focal cystitis versus mass.  There are no aggravating or alleviating factors.  Severity is moderate to severe.  He has a 30-pack-year smoking history.  He is on the transplant list at [facility], but is not currently undergoing dialysis.  He does have a history of nephrolithiasis.  He reports he underwent a cystoscopy 2 years ago in [place] which was reportedly negative.   PMH: Past Medical History:  Diagnosis Date  . Diabetes mellitus without complication ([place]) 6644  . Hypertension 2008  . Renal failure     Surgical History: Past Surgical History:  Procedure Laterality Date  . COLONOSCOPY  1990  . COLONOSCOPY  06/09/2011   Dr Bary Castilla  . EYE SURGERY Right    laser surgery    Allergies:  Allergies  Allergen Reactions  . Hydrocodone Nausea And Vomiting    Family History: Family History  Problem Relation Age of Onset  . Psoriasis Mother   . Heart failure Father     Social  History:  reports that he has quit smoking. He has a 30.00 pack-year smoking history. He has never used smokeless tobacco. He reports that he does not drink alcohol or use drugs.  ROS: Please see flowsheet from today's date for complete review of systems.  Physical Exam: BP (!) 157/88 (BP Location: Left Arm, Patient Position: Sitting, Cuff Size: Large)   Pulse 87   Ht 5' 10"  (1.778 m)   Wt 241 lb 3.2 oz (109.4 kg)   BMI 34.61 kg/m    Constitutional:  Alert and oriented, No acute distress. Cardiovascular: No clubbing, cyanosis, or edema. Respiratory: Normal respiratory effort, no increased work of breathing. GI: Abdomen is soft, nontender, nondistended, no abdominal masses GU: No CVA tenderness, phallus without lesions, widely patent meatus Lymph: No cervical or inguinal lymphadenopathy. Skin: No rashes, bruises or suspicious lesions. Neurologic: Grossly intact, no focal deficits, moving all 4 extremities. Psychiatric: Normal mood and affect.  Laboratory Data: Reviewed, see HPI  Urinalysis today 6-10 WBCs, 3-10 RBCs, few bacteria, nitrite negative  Pertinent Imaging: I have personally reviewed the CT abdomen pelvis non-contrast, mild right hydronephrosis and hydroureter, thickening at the right ureteral insertion consistent with possible cystitis versus mass  Assessment & Plan:   In summary, Jeremy Dorsey is a 67 year old male with renal failure and 5 days of severe urinary frequency, dysuria at the end of his stream, and intermittent right-sided flank pain.  Non-contrast CT showed a possible mass vs cystitis on the right side of the bladder with upstream hydronephrosis.  We  discussed possible etiologies and need for further evaluation with contrasted imaging.  He cannot get contrast with his ESRD, and I recommended proceeding to the operating room for diagnostic cystoscopy, possible biopsy, and bilateral retrograde pyelograms.  We discussed the risks and benefits at length and he would  like to proceed.  STD testing and urine culture today, will call with results  [place], [place] [address], [place], [place] [address] [phone]

## 2018-07-28 NOTE — Telephone Encounter (Signed)
Patient was given the [facility] Information form below as well as the Instructions for Pre-Admission Testing form & a map of [facility].   [place], [place], [place] [address] Telephone: [phone] Fax: [phone]   Thank you for choosing [facility] for your upcoming surgery!  We are always here to assist in your urological needs.  Please read the following information with specific details for your upcoming appointments related to your surgery. Please contact Keevin Panebianco at [phone] Option 3 with any questions.  The Name of Your Surgery: Cystoscopy, bilateral retrograde pyelogram, possible biopsy Your Surgery Date: 08/01/2018 Your Surgeon: Nickolas Madrid  Please register on the first floor of the [facility] on 08/01/2018 at 1:00 then go to Same Day Surgery which is located on the second floor of the [facility].  Patient was advised to have nothing to eat or drink after midnight the night prior to surgery except that he may have only water or clear apple juice until 2 hours before surgery with nothing to drink within 2 hours of surgery.  The patient states he currently takes no blood thinners. Patient's questions were answered and he expressed understanding of these instructions.

## 2018-07-30 LAB — GC/CHLAMYDIA PROBE AMP
Chlamydia trachomatis, NAA: NEGATIVE
Neisseria gonorrhoeae by PCR: NEGATIVE

## 2018-07-31 LAB — CULTURE, URINE COMPREHENSIVE

## 2018-07-31 MED ORDER — CEFAZOLIN SODIUM-DEXTROSE 2-4 GM/100ML-% IV SOLN
2.0000 g | INTRAVENOUS | Status: AC
  Administered 2018-08-01: 2 g via INTRAVENOUS

## 2018-08-01 ENCOUNTER — Ambulatory Visit: Payer: PPO | Admitting: Registered Nurse

## 2018-08-01 ENCOUNTER — Ambulatory Visit
Admission: RE | Admit: 2018-08-01 | Discharge: 2018-08-01 | Disposition: A | Discharge status: Home / Self Care | Payer: PPO | Attending: Urology | Admitting: Urology

## 2018-08-01 ENCOUNTER — Encounter: Payer: Self-pay | Admitting: *Deleted

## 2018-08-01 ENCOUNTER — Telehealth: Payer: Self-pay | Admitting: Urology

## 2018-08-01 ENCOUNTER — Encounter
Admission: RE | Disposition: A | Discharge status: Home / Self Care | Payer: Self-pay | Source: Home / Self Care | Attending: Urology

## 2018-08-01 ENCOUNTER — Other Ambulatory Visit: Payer: Self-pay

## 2018-08-01 DIAGNOSIS — E1122 Type 2 diabetes mellitus with diabetic chronic kidney disease: Secondary | ICD-10-CM | POA: Insufficient documentation

## 2018-08-01 DIAGNOSIS — N2889 Other specified disorders of kidney and ureter: Secondary | ICD-10-CM | POA: Diagnosis not present

## 2018-08-01 DIAGNOSIS — N3091 Cystitis, unspecified with hematuria: Secondary | ICD-10-CM | POA: Diagnosis not present

## 2018-08-01 DIAGNOSIS — I120 Hypertensive chronic kidney disease with stage 5 chronic kidney disease or end stage renal disease: Secondary | ICD-10-CM | POA: Insufficient documentation

## 2018-08-01 DIAGNOSIS — N185 Chronic kidney disease, stage 5: Secondary | ICD-10-CM | POA: Insufficient documentation

## 2018-08-01 DIAGNOSIS — I129 Hypertensive chronic kidney disease with stage 1 through stage 4 chronic kidney disease, or unspecified chronic kidney disease: Secondary | ICD-10-CM | POA: Diagnosis not present

## 2018-08-01 DIAGNOSIS — N1330 Unspecified hydronephrosis: Secondary | ICD-10-CM | POA: Insufficient documentation

## 2018-08-01 DIAGNOSIS — N3081 Other cystitis with hematuria: Secondary | ICD-10-CM | POA: Diagnosis not present

## 2018-08-01 DIAGNOSIS — E669 Obesity, unspecified: Secondary | ICD-10-CM | POA: Diagnosis not present

## 2018-08-01 DIAGNOSIS — Z6833 Body mass index (BMI) 33.0-33.9, adult: Secondary | ICD-10-CM | POA: Diagnosis not present

## 2018-08-01 DIAGNOSIS — R300 Dysuria: Secondary | ICD-10-CM

## 2018-08-01 DIAGNOSIS — N184 Chronic kidney disease, stage 4 (severe): Secondary | ICD-10-CM | POA: Diagnosis not present

## 2018-08-01 DIAGNOSIS — D494 Neoplasm of unspecified behavior of bladder: Secondary | ICD-10-CM | POA: Diagnosis not present

## 2018-08-01 DIAGNOSIS — E785 Hyperlipidemia, unspecified: Secondary | ICD-10-CM | POA: Diagnosis not present

## 2018-08-01 HISTORY — PX: CYSTOSCOPY WITH BIOPSY: SHX5122

## 2018-08-01 HISTORY — PX: CYSTOSCOPY W/ RETROGRADES: SHX1426

## 2018-08-01 HISTORY — PX: URETEROSCOPY WITH HOLMIUM LASER LITHOTRIPSY: SHX6645

## 2018-08-01 HISTORY — PX: CYSTOSCOPY WITH STENT PLACEMENT: SHX5790

## 2018-08-01 HISTORY — PX: TRANSURETHRAL RESECTION OF BLADDER TUMOR: SHX2575

## 2018-08-01 LAB — POCT I-STAT 4, (NA,K, GLUC, HGB,HCT)
Glucose, Bld: 183 mg/dL — ABNORMAL HIGH (ref 70–99)
Glucose, Bld: 168 mg/dL — ABNORMAL HIGH (ref 70–99)
HCT: 38 % — ABNORMAL LOW (ref 39.0–52.0)
HCT: 38 % — ABNORMAL LOW (ref 39.0–52.0)
Hemoglobin: 12.9 g/dL — ABNORMAL LOW (ref 13.0–17.0)
Hemoglobin: 12.9 g/dL — ABNORMAL LOW (ref 13.0–17.0)
Potassium: 6.1 mmol/L — ABNORMAL HIGH (ref 3.5–5.1)
Potassium: 5.7 mmol/L — ABNORMAL HIGH (ref 3.5–5.1)
Sodium: 140 mmol/L (ref 135–145)
Sodium: 139 mmol/L (ref 135–145)

## 2018-08-01 LAB — BASIC METABOLIC PANEL
Anion gap: 8 (ref 5–15)
BUN: 67 mg/dL — ABNORMAL HIGH (ref 8–23)
CO2: 17 mmol/L — ABNORMAL LOW (ref 22–32)
Calcium: 9.2 mg/dL (ref 8.9–10.3)
Chloride: 112 mmol/L — ABNORMAL HIGH (ref 98–111)
Creatinine, Ser: 4.04 mg/dL — ABNORMAL HIGH (ref 0.61–1.24)
GFR calc Af Amer: 16 mL/min — ABNORMAL LOW (ref >60)
GFR calc non Af Amer: 14 mL/min — ABNORMAL LOW (ref >60)
Glucose, Bld: 137 mg/dL — ABNORMAL HIGH (ref 70–99)
Potassium: 6.1 mmol/L — ABNORMAL HIGH (ref 3.5–5.1)
Sodium: 137 mmol/L (ref 135–145)

## 2018-08-01 LAB — GLUCOSE, CAPILLARY
Glucose-Capillary: 132 mg/dL — ABNORMAL HIGH (ref 70–99)
Glucose-Capillary: 164 mg/dL — ABNORMAL HIGH (ref 70–99)
Glucose-Capillary: 151 mg/dL — ABNORMAL HIGH (ref 70–99)
Glucose-Capillary: 107 mg/dL — ABNORMAL HIGH (ref 70–99)

## 2018-08-01 LAB — POTASSIUM: Potassium: 5.7 mmol/L — ABNORMAL HIGH (ref 3.5–5.1)

## 2018-08-01 SURGERY — CYSTOSCOPY, WITH RETROGRADE PYELOGRAM
Anesthesia: General | Site: Ureter | Laterality: Right

## 2018-08-01 MED ORDER — SULFAMETHOXAZOLE-TRIMETHOPRIM 400-80 MG PO TABS: 1.0000 | ORAL_TABLET | Freq: Every day | ORAL | 0 refills | Status: DC

## 2018-08-01 MED ORDER — FENTANYL CITRATE (PF) 100 MCG/2ML IJ SOLN
INTRAMUSCULAR | Status: AC
  Filled 2018-08-01: qty 2

## 2018-08-01 MED ORDER — SUGAMMADEX SODIUM 500 MG/5ML IV SOLN
INTRAVENOUS | Status: DC | PRN
  Administered 2018-08-01: 216 mg via INTRAVENOUS

## 2018-08-01 MED ORDER — IOTHALAMATE MEGLUMINE 43 % IV SOLN
INTRAVENOUS | Status: DC | PRN
  Administered 2018-08-01: 30 mL via URETHRAL

## 2018-08-01 MED ORDER — FUROSEMIDE 10 MG/ML IJ SOLN
10.0000 mg | Freq: Once | INTRAMUSCULAR | Status: AC
  Administered 2018-08-01: 10 mg via INTRAVENOUS

## 2018-08-01 MED ORDER — OXYBUTYNIN CHLORIDE ER 10 MG PO TB24
10.0000 mg | ORAL_TABLET | Freq: Once | ORAL | Status: AC
  Administered 2018-08-01: 10 mg via ORAL
  Filled 2018-08-01: qty 1

## 2018-08-01 MED ORDER — INSULIN ASPART 100 UNIT/ML ~~LOC~~ SOLN
10.0000 [IU] | Freq: Once | SUBCUTANEOUS | Status: AC
  Administered 2018-08-01: 10 [IU] via SUBCUTANEOUS

## 2018-08-01 MED ORDER — LACTATED RINGERS IV SOLN: INTRAVENOUS | Status: DC | PRN

## 2018-08-01 MED ORDER — CALCIUM CHLORIDE 10 % IV SOLN
INTRAVENOUS | Status: AC
  Filled 2018-08-01: qty 10

## 2018-08-01 MED ORDER — PROPOFOL 10 MG/ML IV BOLUS
INTRAVENOUS | Status: DC | PRN
  Administered 2018-08-01: 150 mg via INTRAVENOUS

## 2018-08-01 MED ORDER — FENTANYL CITRATE (PF) 100 MCG/2ML IJ SOLN
25.0000 ug | INTRAMUSCULAR | Status: DC | PRN
  Administered 2018-08-01 (×4): 25 ug via INTRAVENOUS

## 2018-08-01 MED ORDER — BELLADONNA ALKALOIDS-OPIUM 16.2-60 MG RE SUPP
1.0000 | Freq: Once | RECTAL | Status: AC
  Administered 2018-08-01: 1 via RECTAL

## 2018-08-01 MED ORDER — MIDAZOLAM HCL 2 MG/2ML IJ SOLN
INTRAMUSCULAR | Status: AC
  Filled 2018-08-01: qty 2

## 2018-08-01 MED ORDER — DEXTROSE 50 % IV SOLN
50.0000 mL | Freq: Once | INTRAVENOUS | Status: AC
  Administered 2018-08-01: 50 mL via INTRAVENOUS

## 2018-08-01 MED ORDER — FENTANYL CITRATE (PF) 100 MCG/2ML IJ SOLN
INTRAMUSCULAR | Status: DC | PRN
  Administered 2018-08-01 (×4): 50 ug via INTRAVENOUS

## 2018-08-01 MED ORDER — OXYCODONE HCL 5 MG PO TABS: 5.0000 mg | ORAL_TABLET | ORAL | 0 refills | Status: DC | PRN

## 2018-08-01 MED ORDER — ONDANSETRON HCL 4 MG/2ML IJ SOLN
INTRAMUSCULAR | Status: DC | PRN
  Administered 2018-08-01: 4 mg via INTRAVENOUS

## 2018-08-01 MED ORDER — IPRATROPIUM-ALBUTEROL 0.5-2.5 (3) MG/3ML IN SOLN
RESPIRATORY_TRACT | Status: AC
  Administered 2018-08-01: 3 mL via RESPIRATORY_TRACT
  Filled 2018-08-01: qty 3

## 2018-08-01 MED ORDER — IPRATROPIUM-ALBUTEROL 0.5-2.5 (3) MG/3ML IN SOLN
3.0000 mL | Freq: Four times a day (QID) | RESPIRATORY_TRACT | Status: DC
  Administered 2018-08-01: 3 mL via RESPIRATORY_TRACT

## 2018-08-01 MED ORDER — HYDROMORPHONE HCL 1 MG/ML IJ SOLN: 0.5000 mg | INTRAMUSCULAR | Status: DC | PRN

## 2018-08-01 MED ORDER — INSULIN ASPART 100 UNIT/ML ~~LOC~~ SOLN
SUBCUTANEOUS | Status: AC
  Filled 2018-08-01: qty 1

## 2018-08-01 MED ORDER — DEXTROSE 50 % IV SOLN
INTRAVENOUS | Status: AC
  Administered 2018-08-01: 50 mL via INTRAVENOUS
  Filled 2018-08-01: qty 50

## 2018-08-01 MED ORDER — SODIUM CHLORIDE 0.9 % IV SOLN
INTRAVENOUS | Status: DC
  Administered 2018-08-01: 09:00:00 via INTRAVENOUS

## 2018-08-01 MED ORDER — LIDOCAINE HCL (CARDIAC) PF 100 MG/5ML IV SOSY
PREFILLED_SYRINGE | INTRAVENOUS | Status: DC | PRN
  Administered 2018-08-01: 100 mg via INTRAVENOUS

## 2018-08-01 MED ORDER — SUGAMMADEX SODIUM 500 MG/5ML IV SOLN
INTRAVENOUS | Status: AC
  Filled 2018-08-01: qty 5

## 2018-08-01 MED ORDER — MEPERIDINE HCL 50 MG/ML IJ SOLN: 6.2500 mg | INTRAMUSCULAR | Status: DC | PRN

## 2018-08-01 MED ORDER — ROCURONIUM BROMIDE 100 MG/10ML IV SOLN
INTRAVENOUS | Status: DC | PRN
  Administered 2018-08-01: 45 mg via INTRAVENOUS
  Administered 2018-08-01: 25 mg via INTRAVENOUS
  Administered 2018-08-01: 5 mg via INTRAVENOUS

## 2018-08-01 MED ORDER — FENTANYL CITRATE (PF) 100 MCG/2ML IJ SOLN
INTRAMUSCULAR | Status: AC
  Administered 2018-08-01: 25 ug via INTRAVENOUS
  Filled 2018-08-01: qty 2

## 2018-08-01 MED ORDER — DEXTROSE 50 % IV SOLN
INTRAVENOUS | Status: AC
  Administered 2018-08-01: 12.5 g via INTRAVENOUS
  Filled 2018-08-01: qty 50

## 2018-08-01 MED ORDER — ROCURONIUM BROMIDE 50 MG/5ML IV SOLN
INTRAVENOUS | Status: AC
  Filled 2018-08-01: qty 1

## 2018-08-01 MED ORDER — MIDAZOLAM HCL 2 MG/2ML IJ SOLN
INTRAMUSCULAR | Status: DC | PRN
  Administered 2018-08-01: 2 mg via INTRAVENOUS

## 2018-08-01 MED ORDER — PROPOFOL 10 MG/ML IV BOLUS
INTRAVENOUS | Status: AC
  Filled 2018-08-01: qty 20

## 2018-08-01 MED ORDER — BELLADONNA ALKALOIDS-OPIUM 16.2-60 MG RE SUPP
RECTAL | Status: AC
  Administered 2018-08-01: 1 via RECTAL
  Filled 2018-08-01: qty 1

## 2018-08-01 MED ORDER — ONDANSETRON HCL 4 MG/2ML IJ SOLN
INTRAMUSCULAR | Status: AC
  Filled 2018-08-01: qty 2

## 2018-08-01 MED ORDER — HYDROCODONE-ACETAMINOPHEN 5-325 MG PO TABS: 1.0000 | ORAL_TABLET | ORAL | 0 refills | Status: DC | PRN

## 2018-08-01 MED ORDER — PHENYLEPHRINE HCL 10 MG/ML IJ SOLN
INTRAMUSCULAR | Status: DC | PRN
  Administered 2018-08-01 (×2): 100 ug via INTRAVENOUS
  Administered 2018-08-01: 50 ug via INTRAVENOUS

## 2018-08-01 MED ORDER — DEXTROSE 50 % IV SOLN
12.5000 g | Freq: Once | INTRAVENOUS | Status: AC
  Administered 2018-08-01: 12.5 g via INTRAVENOUS

## 2018-08-01 MED ORDER — FUROSEMIDE 10 MG/ML IJ SOLN
INTRAMUSCULAR | Status: AC
  Administered 2018-08-01: 10 mg via INTRAVENOUS
  Filled 2018-08-01: qty 2

## 2018-08-01 MED ORDER — CEFAZOLIN SODIUM-DEXTROSE 2-4 GM/100ML-% IV SOLN
INTRAVENOUS | Status: AC
  Filled 2018-08-01: qty 100

## 2018-08-01 MED ORDER — LIDOCAINE HCL (PF) 2 % IJ SOLN
INTRAMUSCULAR | Status: AC
  Filled 2018-08-01: qty 10

## 2018-08-01 MED ORDER — PROMETHAZINE HCL 25 MG/ML IJ SOLN: 6.2500 mg | INTRAMUSCULAR | Status: DC | PRN

## 2018-08-01 MED ORDER — INSULIN ASPART 100 UNIT/ML ~~LOC~~ SOLN
SUBCUTANEOUS | Status: AC
  Administered 2018-08-01: 10 [IU] via SUBCUTANEOUS
  Filled 2018-08-01: qty 1

## 2018-08-01 SURGICAL SUPPLY — 36 items
BAG DRAIN CYSTO-URO LG1000N (MISCELLANEOUS) ×5 IMPLANT
BAG URINE DRAINAGE (UROLOGICAL SUPPLIES) ×4 IMPLANT
BASKET ZERO TIP 1.9FR (BASKET) ×3 IMPLANT
BRUSH SCRUB EZ  4% CHG (MISCELLANEOUS)
BRUSH SCRUB EZ 4% CHG (MISCELLANEOUS) ×3 IMPLANT
BSKT STON RTRVL ZERO TP 1.9FR (BASKET) ×3
CATH FOL 2WAY LX 20X30 (CATHETERS) ×4 IMPLANT
CATH URETL 5X70 OPEN END (CATHETERS) ×5 IMPLANT
CONRAY 43 FOR UROLOGY 50M (MISCELLANEOUS) ×2 IMPLANT
DRAPE UTILITY 15X26 TOWEL STRL (DRAPES) ×5 IMPLANT
DRSG TELFA 4X3 1S NADH ST (GAUZE/BANDAGES/DRESSINGS) ×5 IMPLANT
ELECT LOOP 22F BIPOLAR SML (ELECTROSURGICAL) ×5
ELECT REM PT RETURN 9FT ADLT (ELECTROSURGICAL) ×5
ELECTRODE LOOP 22F BIPOLAR SML (ELECTROSURGICAL) ×1 IMPLANT
ELECTRODE REM PT RTRN 9FT ADLT (ELECTROSURGICAL) ×3 IMPLANT
FIBER LASER LITHO 273 (Laser) ×3 IMPLANT
GLIDEWIRE STIFF .35X180X3 HYDR (WIRE) ×3 IMPLANT
GLOVE BIOGEL PI IND STRL 7.5 (GLOVE) ×3 IMPLANT
GLOVE BIOGEL PI INDICATOR 7.5 (GLOVE) ×2
GOWN STRL REUS W/ TWL LRG LVL3 (GOWN DISPOSABLE) ×3 IMPLANT
GOWN STRL REUS W/ TWL XL LVL3 (GOWN DISPOSABLE) ×3 IMPLANT
GOWN STRL REUS W/TWL LRG LVL3 (GOWN DISPOSABLE) ×5
GOWN STRL REUS W/TWL XL LVL3 (GOWN DISPOSABLE) ×5
HOLDER FOLEY CATH W/STRAP (MISCELLANEOUS) ×3 IMPLANT
KIT TURNOVER CYSTO (KITS) ×5 IMPLANT
PACK CYSTO AR (MISCELLANEOUS) ×5 IMPLANT
SENSORWIRE 0.038 NOT ANGLED (WIRE) ×10
SET CYSTO W/LG BORE CLAMP LF (SET/KITS/TRAYS/PACK) ×5 IMPLANT
SOL .9 NS 3000ML IRR  AL (IV SOLUTION) ×2
SOL .9 NS 3000ML IRR AL (IV SOLUTION) ×3
SOL .9 NS 3000ML IRR UROMATIC (IV SOLUTION) ×3 IMPLANT
STENT URET 6FRX28 CONTOUR (STENTS) ×2 IMPLANT
SURGILUBE 2OZ TUBE FLIPTOP (MISCELLANEOUS) ×5 IMPLANT
WATER STERILE IRR 1000ML POUR (IV SOLUTION) ×5 IMPLANT
WATER STERILE IRR 3000ML UROMA (IV SOLUTION) ×5 IMPLANT
WIRE SENSOR 0.038 NOT ANGLED (WIRE) ×4 IMPLANT

## 2018-08-01 NOTE — Anesthesia Postprocedure Evaluation (Signed)
Anesthesia Post Note  Patient: Jeremy Dorsey  Procedure(s) Performed: CYSTOSCOPY WITH RETROGRADE PYELOGRAM (Bilateral Ureter) CYSTOSCOPY WITH Bladder BIOPSY (N/A Bladder) TRANSURETHRAL RESECTION OF BLADDER TUMOR (TURBT) (N/A Bladder) URETEROSCOPY WITH HOLMIUM LASER LITHOTRIPSY (Right Ureter) CYSTOSCOPY WITH STENT PLACEMENT (Right Ureter)  Patient location during evaluation: PACU Anesthesia Type: General Level of consciousness: awake and alert and oriented Pain management: pain level controlled Vital Signs Assessment: post-procedure vital signs reviewed and stable Respiratory status: spontaneous breathing, nonlabored ventilation and respiratory function stable Cardiovascular status: blood pressure returned to baseline and stable Postop Assessment: no signs of nausea or vomiting Anesthetic complications: no     Last Vitals:  Vitals:   08/01/18 1407 08/01/18 1422  BP: (!) 167/101 (!) 149/89  Pulse: 84 70  Resp: 17 12  Temp: (!) 35.9 C   SpO2: 100% 100%    Last Pain:  Vitals:   08/01/18 1433  TempSrc:   PainSc: Asleep                 Ayyub Krall

## 2018-08-01 NOTE — Anesthesia Post-op Follow-up Note (Signed)
Anesthesia QCD[facility] form completed.        

## 2018-08-01 NOTE — Telephone Encounter (Signed)
App made 

## 2018-08-01 NOTE — H&P (Signed)
UROLOGY H&P UPDATE  Agree with prior H&P dated 07/28/2018.  67 year old male with CKD and severe dysuria and microscopic hematuria, as well as mild right hydronephrosis with no stones seen.  Infectious work-up negative.  Cannot get contrast imaging to evaluate upper tracts with his renal failure.  Potassium 6.1 in pre-op, agree with anesthesia plan to treat and re-check before proceeding to the OR.  Cardiac: RRR Lungs: CTA bilaterally  Laterality: Bilateral Procedure: Cystourethroscopy, possible urethral dilation, possible biopsy, bilateral retrograde pyelograms, possible right diagnostic ureteroscopy, possible biopsy, possible stent placement  Urinalysis:  Urine culture 07/28/2018 mixed GU flora, 4000 colonies/mL  Informed consent obtained, we specifically discussed the risk of bleeding, infection, ureteral injury, stent related symptoms, injury to surrounding structures, and need for follow-up procedures.  Billey Co, MD 08/01/2018

## 2018-08-01 NOTE — Anesthesia Procedure Notes (Signed)
Procedure Name: Intubation Date/Time: 08/01/2018 12:50 PM Performed by: Geraldine Contras, CRNA Pre-anesthesia Checklist: Patient identified, Emergency Drugs available, Suction available, Patient being monitored and Timeout performed Patient Re-evaluated:Patient Re-evaluated prior to induction Oxygen Delivery Method: Circle system utilized Preoxygenation: Pre-oxygenation with 100% oxygen Induction Type: IV induction Ventilation: Two handed mask ventilation required Laryngoscope Size: McGraph and 3 Grade View: Grade II Tube type: Oral Tube size: 7.5 mm Number of attempts: 1 Airway Equipment and Method: Stylet,  Video-laryngoscopy and Oral airway Placement Confirmation: ETT inserted through vocal cords under direct vision,  positive ETCO2 and breath sounds checked- equal and bilateral Secured at: 22 cm Tube secured with: Tape Dental Injury: Teeth and Oropharynx as per pre-operative assessment

## 2018-08-01 NOTE — Discharge Instructions (Signed)
Indwelling Urinary Catheter Care, Adult An indwelling urinary catheter is a thin tube that is put into your bladder. The tube helps to drain pee (urine) out of your body. The tube goes in through your urethra. Your urethra is where pee comes out of your body. Your pee will come out through the catheter, then it will go into a bag (drainage bag). Take good care of your catheter so it will work well. How to wear your catheter and bag Supplies needed  Sticky tape (adhesive tape) or a leg strap.  Alcohol wipe or soap and water (if you use tape).  A clean towel (if you use tape).  Large overnight bag.  Smaller bag (leg bag). Wearing your catheter Attach your catheter to your leg with tape or a leg strap.  Make sure the catheter is not pulled tight.  If a leg strap gets wet, take it off and put on a dry strap.  If you use tape to hold the bag on your leg: 1. Use an alcohol wipe or soap and water to wash your skin where the tape made it sticky before. 2. Use a clean towel to pat-dry that skin. 3. Use new tape to make the bag stay on your leg. Wearing your bags You should have been given a large overnight bag.  You may wear the overnight bag in the day or night.  Always have the overnight bag lower than your bladder.  Do not let the bag touch the floor.  Before you go to sleep, put a clean plastic bag in a wastebasket. Then hang the overnight bag inside the wastebasket. You should also have a smaller leg bag that fits under your clothes.  Always wear the leg bag below your knee.  Do not wear your leg bag at night. How to care for your skin and catheter Supplies needed  A clean washcloth.  Water and mild soap.  A clean towel. Caring for your skin and catheter      Clean the skin around your catheter every day: ? Wash your hands with soap and water. ? Wet a clean washcloth in warm water and mild soap. ? Clean the skin around your urethra. ? If you are male: ? Gently  spread the folds of skin around your vagina (labia). ? With the washcloth in your other hand, wipe the inner side of your labia on each side. Wipe from front to back. ? If you are male: ? Pull back any skin that covers the end of your penis (foreskin). ? With the washcloth in your other hand, wipe your penis in small circles. Start wiping at the tip of your penis, then move away from the catheter. ? With your free hand, hold the catheter close to where it goes into your body. ? Keep holding the catheter during cleaning so it does not get pulled out. ? With the washcloth in your other hand, clean the catheter. ? Only wipe downward on the catheter. ? Do not wipe upward toward your body. Doing this may push germs into your urethra and cause infection. ? Use a clean towel to pat-dry the catheter and the skin around it. Make sure to wipe off all soap. ? Wash your hands with soap and water.  Shower every day. Do not take baths.  Do not use cream, ointment, or lotion on the area where the catheter goes into your body, unless your doctor tells you to.  Do not use powders, sprays, or lotions  on your genital area.  Check your skin around the catheter every day for signs of infection. Check for: ? Redness, swelling, or pain. ? Fluid or blood. ? Warmth. ? Pus or a bad smell. How to empty the bag Supplies needed  Rubbing alcohol.  Gauze pad or cotton ball.  Tape or a leg strap. Emptying the bag Pour the pee out of your bag when it is ?- full, or at least 2-3 times a day. Do this for your overnight bag and your leg bag. 1. Wash your hands with soap and water. 2. Separate (detach) the bag from your leg. 3. Hold the bag over the toilet or a clean pail. Keep the bag lower than your hips and bladder. This is so the pee (urine) does not go back into the tube. 4. Open the pour spout. It is at the bottom of the bag. 5. Empty the pee into the toilet or pail. Do not let the pour spout touch any  surface. 6. Put rubbing alcohol on a gauze pad or cotton ball. 7. Use the gauze pad or cotton ball to clean the pour spout. 8. Close the pour spout. 9. Attach the bag to your leg with tape or a leg strap. 10. Wash your hands with soap and water. Follow instructions for cleaning the drainage bag:  From the product maker.  As told by your doctor. How to change the bag Supplies needed  Alcohol wipes.  A clean bag.  Tape or a leg strap. Changing the bag Replace your bag with a clean bag once a month. If it starts to leak, smell bad, or look dirty, change it sooner. 1. Wash your hands with soap and water. 2. Separate the dirty bag from your leg. 3. Pinch the catheter with your fingers so that pee does not spill out. 4. Separate the catheter tube from the bag tube where these tubes connect (at the connection valve). Do not let the tubes touch any surface. 5. Clean the end of the catheter tube with an alcohol wipe. Use a different alcohol wipe to clean the end of the bag tube. 6. Connect the catheter tube to the tube of the clean bag. 7. Attach the clean bag to your leg with tape or a leg strap. Do not make the bag tight on your leg. 8. Wash your hands with soap and water. General rules   Never pull on your catheter. Never try to take it out. Doing that can hurt you.  Always wash your hands before and after you touch your catheter or bag. Use a mild, fragrance-free soap. If you do not have soap and water, use hand sanitizer.  Always make sure there are no twists or bends (kinks) in the catheter tube.  Always make sure there are no leaks in the catheter or bag.  Drink enough fluid to keep your pee pale yellow.  Do not take baths, swim, or use a hot tub.  If you are male, wipe from front to back after you poop (have a bowel movement). Contact a doctor if:  Your pee is cloudy.  Your pee smells worse than usual.  Your catheter gets clogged.  Your catheter leaks.  Your  bladder feels full. Get help right away if:  You have redness, swelling, or pain where the catheter goes into your body.  You have fluid, blood, pus, or a bad smell coming from the area where the catheter goes into your body.  Your skin feels warm  where the catheter goes into your body.  You have a fever.  You have pain in your: ? Belly (abdomen). ? Legs. ? Lower back. ? Bladder.  You see blood in the catheter.  Your pee is pink or red.  You feel sick to your stomach (nauseous).  You throw up (vomit).  You have chills.  Your pee is not draining into the bag.  Your catheter gets pulled out. Summary  An indwelling urinary catheter is a thin tube that is placed into the bladder to help drain pee (urine) out of the body.  The catheter is placed into the part of the body that drains pee from the bladder (urethra).  Taking good care of your catheter will keep it working properly and help prevent problems.  Always wash your hands before and after touching your catheter or bag.  Never pull on your catheter or try to take it out. This information is not intended to replace advice given to you by your health care provider. Make sure you discuss any questions you have with your health care provider. Document Released: 11/14/2012 Document Revised: 01/10/2018 Document Reviewed: 03/05/2017 Elsevier Interactive Patient Education  2019 [place]   1) The drugs that you were given will stay in your system until tomorrow so for the next 24 hours you should not:  A) Drive an automobile B) Make any legal decisions C) Drink any alcoholic beverage   2) You may resume regular meals tomorrow.  Today it is better to start with liquids and gradually work up to solid foods.  You may eat anything you prefer, but it is better to start with liquids, then soup and crackers, and gradually work up to solid foods.   3) Please notify  your doctor immediately if you have any unusual bleeding, trouble breathing, redness and pain at the surgery site, drainage, fever, or pain not relieved by medication.    4) Additional Instructions:        Please contact your physician with any problems or Same Day Surgery at [phone], Monday through Friday 6 am to 4 pm, or [place] at [facility] number at [phone].

## 2018-08-01 NOTE — Op Note (Signed)
Date of procedure: 08/01/18  Preoperative diagnosis:  1. Right hydronephrosis, microscopic hematuria, dysuria  Postoperative diagnosis:  Bladder tumor  Procedure: 1. Cystoscopy, bilateral retrograde pyelograms, transurethral resection of bladder tumor, right diagnostic ureteroscopy, laser lithotripsy, right ureteral stent placement  Surgeon: Nickolas Madrid, MD  Anesthesia: General  Complications: None  Intraoperative findings:  1.  Normal-appearing urethra, moderate size prostate 2.  Bullous and papillary changes surrounding right ureteral orifice concerning for possible tumor.  Additional 1 cm papillary area posterior to the right ureteral orifice.  1 cm area of erythema and bullous changes at the right lateral wall 3.  All concerning areas biopsied and resected/fulgurated.  Right ureteral orifice resected, able to identify orifice after resection and diagnostic right ureteroscopy showed no tumor in the right upper tract.  There were multiple 3 to 4 mm congealed clots in the right ureter that were broken up and removed using the basket. 4.  Right ureteral stent placement  EBL: Minimal  Specimens:  1.  Bladder tumor cold cup biopsy 2.  Bladder tumor resection  Drains: Right 6 French by 28 cm ureteral stent, 20 French two-way Foley  Indication: AGUSTUS MANE is a 67 y.o. patient with stage V CKD that presented to the ER earlier this month with acute onset of right-sided flank pain and microscopic hematuria.  A non-contrast CT showed right-sided hydronephrosis with possible bladder mass versus changes from cystitis on the right trigone.  Infectious work-up was negative.  He elected to proceed with cystoscopy, bilateral retrograde pyelogram to complete hematuria work-up, possible biopsy, possible stent placement, and other indicated procedures.   After reviewing the management options for treatment, they elected to proceed with the above surgical procedure(s). We have discussed the  potential benefits and risks of the procedure, side effects of the proposed treatment, the likelihood of the patient achieving the goals of the procedure, and any potential problems that might occur during the procedure or recuperation. Informed consent has been obtained.  Description of procedure:  The patient was taken to the operating room and general anesthesia was induced.  The patient was placed in the dorsal lithotomy position, prepped and draped in the usual sterile fashion, and preoperative antibiotics were administered. A preoperative time-out was performed.   A 21 French rigid cystoscope was used to intubate the urethra.  Normal-appearing urethra was followed proximally to the bladder.  The prostate was moderate in size.  Cystoscopy was notable for significant bullous edema and papillary changes surrounding the right ureteral orifice, as well as an additional 1 cm area of papillary change medial and posterior to the right ureteral orifice.  There was also a distinct separate area of 1 cm erythema and bullous change at the right lateral bladder wall.  This did not appear classic for urothelial cell carcinoma.  There was good efflux from the left ureteral orifice, and a retrograde pyelogram on that side showed no filling defects.  Secondary to the bullous edema and changes around the right ureteral orifice we were unable to cannulate this with a 5 French access catheter, sensor wire, or angled Glidewire.  At this point I decided to biopsy and then resect the abnormal lesions in the bladder, with goal of identifying the right ureteral orifice and be able to perform retrograde and possible diagnostic ureteroscopy.  The cold cup biopsy forceps were used to take large biopsies of the aforementioned areas in the bladder.  The resectoscope was then inserted using the visual obturator and a large loop used to  resect all abnormal tissue surrounding the right ureteral orifice, as well as at the right lateral  bladder wall.  I was able to identify the ureteral orifice at this point.  Careful hemostasis was achieved using spot cautery.  Thorough inspection of the bladder at this point revealed no active bleeding, and all visible abnormal areas of the bladder had been resected and fulgurated.  With the aid of a 5 French access catheter, we were able to advance a sensor wire into the right ureteral orifice up to the collecting system.  A semirigid ureteroscope was advanced alongside the wire and no tumor was seen in the distal to mid ureter.  However, there were multiple small black spherical structures.  We attempted to basket extract these, however they disintegrated within the basket when closing.  I suspect these were old congealed clot.  Retrograde pyelogram was performed through the semirigid ureteroscope which showed upstream hydronephrosis but no other filling defects.  A second safety wire was added through the scope.  In the setting of the prior congealed clots I elected to perform flexible ureteroscopy of the upper tract to evaluate for any source of bleeding or other abnormal findings.  Flexible ureteroscope was advanced over the wire into the collecting system.  Thorough pyeloscopy revealed 2 additional old small clots that were lasered into small fragments.  Thorough pyeloscopy did not reveal any papillary tumors or abnormal mucosa.  Pullback ureteroscopy demonstrated no ureteral tumors.  The rigid cystoscope was backloaded over the wire and a 6 [place] by 28 cm stent was uneventfully placed in the right collecting system.  Thorough inspection of the bladder and decompressed state revealed no bleeding.  A 20 French Foley catheter was placed easily.  Disposition: Stable to PACU  Plan: Follow-up in 5 days in urology clinic for Foley removal and voiding trial, will discuss pathology results at that time  Nickolas Madrid, MD

## 2018-08-01 NOTE — OR Nursing (Signed)
After one dose of IV lasix, two doses of SQ insulin and two doses of IV D50 potassium result is 5.7 and BS is 151. Dr. Randa Lynn notifed and she reports it is okay to proceed with surgery.

## 2018-08-01 NOTE — Anesthesia Preprocedure Evaluation (Signed)
Anesthesia Evaluation  Patient identified by MRN, date of birth, ID band Patient awake    Reviewed: Allergy & Precautions, NPO status , Patient's Chart, lab work & pertinent test results  History of Anesthesia Complications Negative for: history of anesthetic complications  Airway Mallampati: III  TM Distance: >3 FB Neck ROM: Full    Dental  (+) Implants   Pulmonary neg sleep apnea, neg COPD, former smoker,    breath sounds clear to auscultation- rhonchi (-) wheezing      Cardiovascular Exercise Tolerance: Good hypertension, Pt. on medications (-) CAD, (-) Past MI, (-) Cardiac Stents and (-) CABG  Rhythm:Regular Rate:Normal - Systolic murmurs and - Diastolic murmurs    Neuro/Psych neg Seizures negative neurological ROS  negative psych ROS   GI/Hepatic negative GI ROS, Neg liver ROS,   Endo/Other  diabetes, Oral Hypoglycemic Agents  Renal/GU Renal InsufficiencyRenal disease     Musculoskeletal negative musculoskeletal ROS (+)   Abdominal (+) + obese,   Peds  Hematology negative hematology ROS (+)   Anesthesia Other Findings Past Medical History: 2008: Diabetes mellitus without complication ([place]) 5797: Hypertension No date: Renal failure   Reproductive/Obstetrics                             Anesthesia Physical Anesthesia Plan  ASA: III  Anesthesia Plan: General   Post-op Pain Management:    Induction: Intravenous  PONV Risk Score and Plan: 1 and Ondansetron and Midazolam  Airway Management Planned: Oral ETT  Additional Equipment:   Intra-op Plan:   Post-operative Plan: Extubation in OR  Informed Consent: I have reviewed the patients History and Physical, chart, labs and discussed the procedure including the risks, benefits and alternatives for the proposed anesthesia with the patient or authorized representative who has indicated his/her understanding and acceptance.    Dental advisory given  Plan Discussed with: CRNA and Anesthesiologist  Anesthesia Plan Comments: (Pt hyperkalemic on arrival this morning, plan to treat with multiple agents to lower potassium prior to proceeding with surgery)        Anesthesia Quick Evaluation

## 2018-08-01 NOTE — Transfer of Care (Signed)
Immediate Anesthesia Transfer of Care Note  Patient: Jeremy Dorsey  Procedure(s) Performed: CYSTOSCOPY WITH RETROGRADE PYELOGRAM (Bilateral Ureter) CYSTOSCOPY WITH Bladder BIOPSY (N/A Bladder) TRANSURETHRAL RESECTION OF BLADDER TUMOR (TURBT) (N/A Bladder) URETEROSCOPY WITH HOLMIUM LASER LITHOTRIPSY (Right Ureter) CYSTOSCOPY WITH STENT PLACEMENT (Right Ureter)  Patient Location: PACU  Anesthesia Type:General  Level of Consciousness: awake  Airway & Oxygen Therapy: Patient Spontanous Breathing  Post-op Assessment: Report given to RN and Post -op Vital signs reviewed and stable  Post vital signs: stable  Last Vitals:  Vitals Value Taken Time  BP 167/101 08/01/2018  2:07 PM  Temp    Pulse 79 08/01/2018  2:08 PM  Resp 18 08/01/2018  2:08 PM  SpO2 100 % 08/01/2018  2:08 PM  Vitals shown include unvalidated device data.  Last Pain:  Vitals:   08/01/18 0811  TempSrc: Tympanic  PainSc: 0-No pain         Complications: No apparent anesthesia complications

## 2018-08-01 NOTE — Telephone Encounter (Signed)
-----   Message from Billey Co, MD sent at 08/01/2018  3:00 PM EST ----- Regarding: follow up Please schedule nurse visit for foley removal and voiding trial Friday 08/05/2018. I will discuss pathology results with him that day as well if they have returned. OK to overbook.  Nickolas Madrid, MD 08/01/2018

## 2018-08-02 ENCOUNTER — Telehealth: Payer: Self-pay

## 2018-08-02 ENCOUNTER — Encounter: Payer: Self-pay | Admitting: Urology

## 2018-08-02 NOTE — Telephone Encounter (Signed)
Called's patient's wife, who requested a call back. Wife states that the patient is "fine when he's asleep, but when he wakes up his urine stings" patient states that "it's a piercing laser pain" Wife states that she has doubled up on the patient's pain medication and is giving the patient 10mg  of oxycodone every 4 hours. Patient denies fevers or chills. Advised wife on adding tylenol in between oxycodone for breakthrough pain, advised wife that office will be closed tomorrow and there is no provider in house today therefore she will need to take the patient to the ED if pain continues to be uncontrolled. Wife and patient gave verbal understanding.

## 2018-08-03 ENCOUNTER — Encounter: Payer: Self-pay | Admitting: Emergency Medicine

## 2018-08-03 ENCOUNTER — Other Ambulatory Visit: Payer: Self-pay

## 2018-08-03 ENCOUNTER — Emergency Department
Admission: EM | Admit: 2018-08-03 | Discharge: 2018-08-03 | Disposition: A | Discharge status: Home / Self Care | Payer: PPO | Attending: Emergency Medicine | Admitting: Emergency Medicine

## 2018-08-03 DIAGNOSIS — N4889 Other specified disorders of penis: Secondary | ICD-10-CM | POA: Diagnosis not present

## 2018-08-03 DIAGNOSIS — Z5321 Procedure and treatment not carried out due to patient leaving prior to being seen by health care provider: Secondary | ICD-10-CM | POA: Diagnosis not present

## 2018-08-03 LAB — CBC
HCT: 41.1 % (ref 39.0–52.0)
Hemoglobin: 12.9 g/dL — ABNORMAL LOW (ref 13.0–17.0)
MCH: 29.7 pg (ref 26.0–34.0)
MCHC: 31.4 g/dL (ref 30.0–36.0)
MCV: 94.5 fL (ref 80.0–100.0)
Platelets: 249 10*3/uL (ref 150–400)
RBC: 4.35 MIL/uL (ref 4.22–5.81)
RDW: 12.5 % (ref 11.5–15.5)
WBC: 13.8 10*3/uL — ABNORMAL HIGH (ref 4.0–10.5)
nRBC: 0 % (ref 0.0–0.2)

## 2018-08-03 LAB — LIPASE, BLOOD: Lipase: 44 U/L (ref 11–51)

## 2018-08-03 LAB — COMPREHENSIVE METABOLIC PANEL
ALT: 15 U/L (ref 0–44)
AST: 14 U/L — ABNORMAL LOW (ref 15–41)
Albumin: 4.4 g/dL (ref 3.5–5.0)
Alkaline Phosphatase: 125 U/L (ref 38–126)
Anion gap: 9 (ref 5–15)
BUN: 71 mg/dL — ABNORMAL HIGH (ref 8–23)
CO2: 18 mmol/L — ABNORMAL LOW (ref 22–32)
Calcium: 9.6 mg/dL (ref 8.9–10.3)
Chloride: 111 mmol/L (ref 98–111)
Creatinine, Ser: 4.81 mg/dL — ABNORMAL HIGH (ref 0.61–1.24)
GFR calc Af Amer: 13 mL/min — ABNORMAL LOW (ref >60)
GFR calc non Af Amer: 12 mL/min — ABNORMAL LOW (ref >60)
Glucose, Bld: 171 mg/dL — ABNORMAL HIGH (ref 70–99)
Potassium: 5.9 mmol/L — ABNORMAL HIGH (ref 3.5–5.1)
Sodium: 138 mmol/L (ref 135–145)
Total Bilirubin: 0.5 mg/dL (ref 0.3–1.2)
Total Protein: 7.2 g/dL (ref 6.5–8.1)

## 2018-08-03 LAB — GLUCOSE, CAPILLARY: Glucose-Capillary: 150 mg/dL — ABNORMAL HIGH (ref 70–99)

## 2018-08-03 NOTE — ED Triage Notes (Signed)
Pt had bladder/urethra scraped Monday and foley placed. Since then pt reports when foley drains he has pain in his penis.  Has vomiting when takes his pain pill or eats.  No vomiting when not eating or taking medication. Is diabetic. Has not checked sugar or taken his medication.  No fevers. Catheter is draining appropriately per pt.  VSS. NAD

## 2018-08-03 NOTE — ED Notes (Signed)
Pt to desk, states he can't wait any longer, advised pt to stay if possible.  He states he can't stand up.  Offered subwait and recliner however, pt declined and left.

## 2018-08-04 ENCOUNTER — Telehealth: Payer: Self-pay | Admitting: Urology

## 2018-08-04 LAB — SURGICAL PATHOLOGY

## 2018-08-04 MED ORDER — OXYBUTYNIN CHLORIDE ER 10 MG PO TB24: 10.0000 mg | ORAL_TABLET | Freq: Every day | ORAL | 0 refills | Status: DC

## 2018-08-04 MED ORDER — CETIRIZINE HCL 10 MG PO CAPS: 10.0000 mg | ORAL_CAPSULE | Freq: Every day | ORAL | 0 refills | Status: DC

## 2018-08-04 NOTE — Telephone Encounter (Signed)
UROLOGY TELEPHONE NOTE  68 yo M with stage V CKD s/p TURBT, right ureteral stent placement, foley placement 12/30.  Complains of intermittent severe penile and pelvic pain, suspect bladder spasm.  Cannot take NSAIDs with his CKD, narcotics make him severely nauseated.  Surgical pathology shows eosinophilic cystitis, no malignancy. Discussed this is rare, treatment typically anti-histamine (cetirizine 10mg /day for 4 weeks, consider prednisolone 20mg  day 4 weeks if refractory).  Prescribed oxybutynin XL 10mg  for him to pick up tonight, as well as cetirizine. Keep scheduled follow up tomorrow for foley removal and void trial. RTC 4 weeks for symptom check.  Nickolas Madrid, MD 08/04/2018

## 2018-08-04 NOTE — Addendum Note (Signed)
Addended by: Nickolas Madrid C on: 08/04/2018 05:00 PM   Modules accepted: Orders

## 2018-08-04 NOTE — Telephone Encounter (Signed)
Pt wife following up on phone call made Tuesday, was told someone would call but hasn't heard anything yet. Pt is still in a lot of pain, went to ER yesterday but left without being seen. Pt wife now calling asking what to do or what can be done. States pain meds makes pt throw up, quit taking it, taking Tylenol only with no relief. Please advise pt at [phone]

## 2018-08-05 ENCOUNTER — Ambulatory Visit: Payer: PPO

## 2018-08-05 DIAGNOSIS — N200 Calculus of kidney: Secondary | ICD-10-CM

## 2018-08-05 NOTE — Progress Notes (Signed)
Catheter Removal  Patient is present today for a catheter removal.  67ml of water was drained from the balloon. A 20FR foley cath was removed from the bladder no complications were noted . Patient tolerated well.  Preformed by: Fonnie Jarvis, CMA  Follow up/ Additional notes: follow up as scheduled

## 2018-08-08 ENCOUNTER — Telehealth: Payer: Self-pay | Admitting: Urology

## 2018-08-08 DIAGNOSIS — E875 Hyperkalemia: Secondary | ICD-10-CM | POA: Diagnosis not present

## 2018-08-08 DIAGNOSIS — N2581 Secondary hyperparathyroidism of renal origin: Secondary | ICD-10-CM | POA: Diagnosis not present

## 2018-08-08 DIAGNOSIS — I129 Hypertensive chronic kidney disease with stage 1 through stage 4 chronic kidney disease, or unspecified chronic kidney disease: Secondary | ICD-10-CM | POA: Diagnosis not present

## 2018-08-08 DIAGNOSIS — R809 Proteinuria, unspecified: Secondary | ICD-10-CM | POA: Diagnosis not present

## 2018-08-08 DIAGNOSIS — N3080 Other cystitis without hematuria: Secondary | ICD-10-CM | POA: Diagnosis not present

## 2018-08-08 DIAGNOSIS — E1122 Type 2 diabetes mellitus with diabetic chronic kidney disease: Secondary | ICD-10-CM | POA: Diagnosis not present

## 2018-08-08 DIAGNOSIS — M75121 Complete rotator cuff tear or rupture of right shoulder, not specified as traumatic: Secondary | ICD-10-CM | POA: Diagnosis not present

## 2018-08-08 DIAGNOSIS — Z6835 Body mass index (BMI) 35.0-35.9, adult: Secondary | ICD-10-CM | POA: Diagnosis not present

## 2018-08-08 DIAGNOSIS — N184 Chronic kidney disease, stage 4 (severe): Secondary | ICD-10-CM | POA: Diagnosis not present

## 2018-08-08 MED ORDER — DIAZEPAM 5 MG PO TABS: 5.0000 mg | ORAL_TABLET | Freq: Once | ORAL | 0 refills | Status: DC | PRN

## 2018-08-08 NOTE — Telephone Encounter (Signed)
Patient's wife called and said that you were going to call him in a valium prior to his stent removal? I do not see anything about that or that it has been done. If so can you call that in for him please?   Thanks, Sharyn Lull

## 2018-08-08 NOTE — Telephone Encounter (Signed)
Please let him know I sent the valium to his [facility] pharmacy.  Thanks Nickolas Madrid, MD 08/08/2018

## 2018-08-08 NOTE — Addendum Note (Signed)
Addended by: Billey Co on: 08/08/2018 04:23 PM   Modules accepted: Orders

## 2018-08-09 NOTE — Telephone Encounter (Signed)
Patient has been notified

## 2018-08-11 ENCOUNTER — Encounter: Payer: Self-pay | Admitting: Urology

## 2018-08-11 ENCOUNTER — Ambulatory Visit (INDEPENDENT_AMBULATORY_CARE_PROVIDER_SITE_OTHER): Payer: PPO | Admitting: Urology

## 2018-08-11 VITALS — BP 150/70 | HR 84 | Ht 70.0 in | Wt 238.0 lb

## 2018-08-11 DIAGNOSIS — N200 Calculus of kidney: Secondary | ICD-10-CM

## 2018-08-11 DIAGNOSIS — N3080 Other cystitis without hematuria: Secondary | ICD-10-CM

## 2018-08-11 LAB — URINALYSIS, COMPLETE
Bilirubin, UA: NEGATIVE
Glucose, UA: NEGATIVE
Ketones, UA: NEGATIVE
Nitrite, UA: NEGATIVE
Specific Gravity, UA: 1.02 (ref 1.005–1.030)
Urobilinogen, Ur: 0.2 mg/dL (ref 0.2–1.0)
pH, UA: 5.5 (ref 5.0–7.5)

## 2018-08-11 LAB — MICROSCOPIC EXAMINATION

## 2018-08-11 NOTE — Progress Notes (Signed)
Cystoscopy Procedure Note:  Indication: Stent removal s/p TURBT, right ureteral orifice resection, right ureteral stent placement. Pathology showed eosinophilic cystitis.  After informed consent and discussion of the procedure and its risks, Jeremy Dorsey was positioned and prepped in the standard fashion. Cystoscopy was performed with a flexible cystoscope. The stent was grasped with flexible graspers and removed in its entirety. The patient tolerated the procedure well.  Findings: Uncomplicated stent removal  Assessment and Plan: -Continue 4 week course of cetirizine -RTC 4 weeks for symptom check, consider course of steroids at that time if remains symptomatic with dysuria   Billey Co, MD 08/11/2018

## 2018-08-12 ENCOUNTER — Other Ambulatory Visit: Payer: Self-pay | Admitting: Orthopedic Surgery

## 2018-08-17 DIAGNOSIS — N184 Chronic kidney disease, stage 4 (severe): Secondary | ICD-10-CM | POA: Diagnosis not present

## 2018-08-22 ENCOUNTER — Encounter
Admission: RE | Admit: 2018-08-22 | Discharge: 2018-08-22 | Disposition: A | Discharge status: Home / Self Care | Payer: PPO | Source: Ambulatory Visit | Attending: Orthopedic Surgery | Admitting: Orthopedic Surgery

## 2018-08-22 ENCOUNTER — Other Ambulatory Visit: Payer: Self-pay

## 2018-08-22 DIAGNOSIS — Z01812 Encounter for preprocedural laboratory examination: Secondary | ICD-10-CM

## 2018-08-22 DIAGNOSIS — S46211A Strain of muscle, fascia and tendon of other parts of biceps, right arm, initial encounter: Secondary | ICD-10-CM | POA: Diagnosis not present

## 2018-08-22 DIAGNOSIS — E119 Type 2 diabetes mellitus without complications: Secondary | ICD-10-CM | POA: Diagnosis not present

## 2018-08-22 DIAGNOSIS — X58XXXA Exposure to other specified factors, initial encounter: Secondary | ICD-10-CM | POA: Diagnosis not present

## 2018-08-22 DIAGNOSIS — M75121 Complete rotator cuff tear or rupture of right shoulder, not specified as traumatic: Secondary | ICD-10-CM | POA: Diagnosis not present

## 2018-08-22 DIAGNOSIS — M25511 Pain in right shoulder: Secondary | ICD-10-CM | POA: Diagnosis not present

## 2018-08-22 DIAGNOSIS — Z7984 Long term (current) use of oral hypoglycemic drugs: Secondary | ICD-10-CM | POA: Diagnosis not present

## 2018-08-22 DIAGNOSIS — Z79899 Other long term (current) drug therapy: Secondary | ICD-10-CM | POA: Diagnosis not present

## 2018-08-22 DIAGNOSIS — Z87891 Personal history of nicotine dependence: Secondary | ICD-10-CM | POA: Diagnosis not present

## 2018-08-22 HISTORY — DX: Pain in unspecified shoulder: M25.519

## 2018-08-22 HISTORY — DX: Other postprocedural complications and disorders of genitourinary system: N99.89

## 2018-08-22 HISTORY — DX: Localized edema: R60.0

## 2018-08-22 HISTORY — DX: Reserved for concepts with insufficient information to code with codable children: IMO0002

## 2018-08-22 LAB — CBC WITH DIFFERENTIAL/PLATELET
Abs Immature Granulocytes: 0.02 10*3/uL (ref 0.00–0.07)
Basophils Absolute: 0.1 10*3/uL (ref 0.0–0.1)
Basophils Relative: 1 %
Eosinophils Absolute: 0.4 10*3/uL (ref 0.0–0.5)
Eosinophils Relative: 5 %
HCT: 40.6 % (ref 39.0–52.0)
Hemoglobin: 12.9 g/dL — ABNORMAL LOW (ref 13.0–17.0)
Immature Granulocytes: 0 %
Lymphocytes Relative: 28 %
Lymphs Abs: 2.2 10*3/uL (ref 0.7–4.0)
MCH: 29.9 pg (ref 26.0–34.0)
MCHC: 31.8 g/dL (ref 30.0–36.0)
MCV: 94.2 fL (ref 80.0–100.0)
Monocytes Absolute: 0.5 10*3/uL (ref 0.1–1.0)
Monocytes Relative: 6 %
Neutro Abs: 4.6 10*3/uL (ref 1.7–7.7)
Neutrophils Relative %: 60 %
Platelets: 191 10*3/uL (ref 150–400)
RBC: 4.31 MIL/uL (ref 4.22–5.81)
RDW: 13.1 % (ref 11.5–15.5)
WBC: 7.8 10*3/uL (ref 4.0–10.5)
nRBC: 0 % (ref 0.0–0.2)

## 2018-08-22 LAB — BASIC METABOLIC PANEL
Anion gap: 7 (ref 5–15)
BUN: 57 mg/dL — ABNORMAL HIGH (ref 8–23)
CO2: 15 mmol/L — ABNORMAL LOW (ref 22–32)
Calcium: 8.7 mg/dL — ABNORMAL LOW (ref 8.9–10.3)
Chloride: 116 mmol/L — ABNORMAL HIGH (ref 98–111)
Creatinine, Ser: 3.84 mg/dL — ABNORMAL HIGH (ref 0.61–1.24)
GFR calc Af Amer: 18 mL/min — ABNORMAL LOW (ref >60)
GFR calc non Af Amer: 15 mL/min — ABNORMAL LOW (ref >60)
Glucose, Bld: 207 mg/dL — ABNORMAL HIGH (ref 70–99)
Potassium: 4.8 mmol/L (ref 3.5–5.1)
Sodium: 138 mmol/L (ref 135–145)

## 2018-08-22 LAB — PROTIME-INR
INR: 1.09
Prothrombin Time: 14 seconds (ref 11.4–15.2)

## 2018-08-22 LAB — APTT: aPTT: 35 seconds (ref 24–36)

## 2018-08-22 MED ORDER — CHLORHEXIDINE GLUCONATE CLOTH 2 % EX PADS
6.0000 | MEDICATED_PAD | Freq: Once | CUTANEOUS | Status: DC
  Filled 2018-08-22: qty 6

## 2018-08-22 NOTE — Patient Instructions (Signed)
Your procedure is scheduled on: 08/25/18 Thurs Report to Same Day Surgery 2nd floor medical mall [facility] Entrance-take elevator on left to 2nd floor.  Check in with surgery information desk.) To find out your arrival time please call [phone] between 1PM - 3PM on 08/24/18 Wed  Remember: Instructions that are not followed completely may result in serious medical risk, up to and including death, or upon the discretion of your surgeon and anesthesiologist your surgery may need to be rescheduled.    _x___ 1. Do not eat food after midnight the night before your procedure. You may drink clear liquids up to 2 hours before you are scheduled to arrive at the hospital for your procedure.  Do not drink clear liquids within 2 hours of your scheduled arrival to the hospital.  Clear liquids include  --Water or Apple juice without pulp  --Clear carbohydrate beverage such as ClearFast or Gatorade  --Black Coffee or Clear Tea (No milk, no creamers, do not add anything to                  the coffee or Tea Type 1 and type 2 diabetics should only drink water.   ____Ensure clear carbohydrate drink on the way to the hospital for bariatric patients  ____Ensure clear carbohydrate drink 3 hours before surgery for Dr Dwyane Luo patients if physician instructed.   No gum chewing or hard candies.     __x__ 2. No Alcohol for 24 hours before or after surgery.   __x__3. No Smoking or e-cigarettes for 24 prior to surgery.  Do not use any chewable tobacco products for at least 6 hour prior to surgery   ____  4. Bring all medications with you on the day of surgery if instructed.    __x__ 5. Notify your doctor if there is any change in your medical condition     (cold, fever, infections).    x___6. On the morning of surgery brush your teeth with toothpaste and water.  You may rinse your mouth with mouth wash if you wish.  Do not swallow any toothpaste or mouthwash.   Do not wear jewelry, make-up, hairpins,  clips or nail polish.  Do not wear lotions, powders, or perfumes. You may wear deodorant.  Do not shave 48 hours prior to surgery. Men may shave face and neck.  Do not bring valuables to the hospital.    [facility] is not responsible for any belongings or valuables.               Contacts, dentures or bridgework may not be worn into surgery.  Leave your suitcase in the car. After surgery it may be brought to your room.  For patients admitted to the hospital, discharge time is determined by your                       treatment team.  _  Patients discharged the day of surgery will not be allowed to drive home.  You will need someone to drive you home and stay with you the night of your procedure.    Please read over the following fact sheets that you were given:   [facility] Preparing for Surgery and or MRSA Information   _x___ Take anti-hypertensive listed below, cardiac, seizure, asthma,     anti-reflux and psychiatric medicines. These include:  1. patiromer (VELTASSA) 8.4 g packet  2.  3.  4.  5.  6.  ____Fleets enema or  Magnesium Citrate as directed.   _x___ Use CHG Soap or sage wipes as directed on instruction sheet   ____ Use inhalers on the day of surgery and bring to hospital day of surgery  ____ Stop Metformin and Janumet 2 days prior to surgery.    ____ Take 1/2 of usual insulin dose the night before surgery and none on the morning     surgery.   _x___ Follow recommendations from Cardiologist, Pulmonologist or PCP regarding          stopping Aspirin, Coumadin, Plavix ,Eliquis, Effient, or Pradaxa, and Pletal.  X____Stop Anti-inflammatories such as Advil, Aleve, Ibuprofen, Motrin, Naproxen, Naprosyn, Goodies powders or aspirin products. OK to take Tylenol and                          Celebrex.   _x___ Stop supplements until after surgery.  But may continue Vitamin D, Vitamin B,       and multivitamin.   ____ Bring C-Pap to the hospital.

## 2018-08-24 MED ORDER — CEFAZOLIN SODIUM-DEXTROSE 2-4 GM/100ML-% IV SOLN
2.0000 g | INTRAVENOUS | Status: AC
  Administered 2018-08-25: 2 g via INTRAVENOUS

## 2018-08-25 ENCOUNTER — Ambulatory Visit: Payer: PPO | Admitting: Certified Registered Nurse Anesthetist

## 2018-08-25 ENCOUNTER — Ambulatory Visit
Admission: RE | Admit: 2018-08-25 | Discharge: 2018-08-25 | Disposition: A | Discharge status: Home / Self Care | Payer: PPO | Attending: Orthopedic Surgery | Admitting: Orthopedic Surgery

## 2018-08-25 ENCOUNTER — Encounter
Admission: RE | Disposition: A | Discharge status: Home / Self Care | Payer: Self-pay | Source: Home / Self Care | Attending: Orthopedic Surgery

## 2018-08-25 ENCOUNTER — Other Ambulatory Visit: Payer: Self-pay

## 2018-08-25 ENCOUNTER — Encounter: Payer: Self-pay | Admitting: Emergency Medicine

## 2018-08-25 DIAGNOSIS — X58XXXA Exposure to other specified factors, initial encounter: Secondary | ICD-10-CM | POA: Insufficient documentation

## 2018-08-25 DIAGNOSIS — M75121 Complete rotator cuff tear or rupture of right shoulder, not specified as traumatic: Secondary | ICD-10-CM | POA: Diagnosis not present

## 2018-08-25 DIAGNOSIS — S46111A Strain of muscle, fascia and tendon of long head of biceps, right arm, initial encounter: Secondary | ICD-10-CM | POA: Diagnosis not present

## 2018-08-25 DIAGNOSIS — Z87891 Personal history of nicotine dependence: Secondary | ICD-10-CM | POA: Insufficient documentation

## 2018-08-25 DIAGNOSIS — M25511 Pain in right shoulder: Secondary | ICD-10-CM | POA: Insufficient documentation

## 2018-08-25 DIAGNOSIS — M7541 Impingement syndrome of right shoulder: Secondary | ICD-10-CM | POA: Diagnosis not present

## 2018-08-25 DIAGNOSIS — E1122 Type 2 diabetes mellitus with diabetic chronic kidney disease: Secondary | ICD-10-CM | POA: Diagnosis not present

## 2018-08-25 DIAGNOSIS — I129 Hypertensive chronic kidney disease with stage 1 through stage 4 chronic kidney disease, or unspecified chronic kidney disease: Secondary | ICD-10-CM | POA: Diagnosis not present

## 2018-08-25 DIAGNOSIS — Z79899 Other long term (current) drug therapy: Secondary | ICD-10-CM | POA: Insufficient documentation

## 2018-08-25 DIAGNOSIS — N184 Chronic kidney disease, stage 4 (severe): Secondary | ICD-10-CM | POA: Diagnosis not present

## 2018-08-25 DIAGNOSIS — M19011 Primary osteoarthritis, right shoulder: Secondary | ICD-10-CM | POA: Diagnosis not present

## 2018-08-25 DIAGNOSIS — E785 Hyperlipidemia, unspecified: Secondary | ICD-10-CM | POA: Diagnosis not present

## 2018-08-25 DIAGNOSIS — E119 Type 2 diabetes mellitus without complications: Secondary | ICD-10-CM | POA: Insufficient documentation

## 2018-08-25 DIAGNOSIS — S46211A Strain of muscle, fascia and tendon of other parts of biceps, right arm, initial encounter: Secondary | ICD-10-CM | POA: Insufficient documentation

## 2018-08-25 DIAGNOSIS — Z7984 Long term (current) use of oral hypoglycemic drugs: Secondary | ICD-10-CM | POA: Insufficient documentation

## 2018-08-25 DIAGNOSIS — G8918 Other acute postprocedural pain: Secondary | ICD-10-CM | POA: Diagnosis not present

## 2018-08-25 HISTORY — PX: SHOULDER ARTHROSCOPY WITH OPEN ROTATOR CUFF REPAIR: SHX6092

## 2018-08-25 LAB — GLUCOSE, CAPILLARY
Glucose-Capillary: 133 mg/dL — ABNORMAL HIGH (ref 70–99)
Glucose-Capillary: 209 mg/dL — ABNORMAL HIGH (ref 70–99)

## 2018-08-25 SURGERY — ARTHROSCOPY, SHOULDER WITH REPAIR, ROTATOR CUFF, OPEN
Anesthesia: General | Site: Shoulder | Laterality: Right

## 2018-08-25 MED ORDER — EPINEPHRINE PF 1 MG/ML IJ SOLN
INTRAMUSCULAR | Status: DC | PRN
  Administered 2018-08-25: 4 mL

## 2018-08-25 MED ORDER — FAMOTIDINE 20 MG PO TABS
20.0000 mg | ORAL_TABLET | Freq: Once | ORAL | Status: AC
  Administered 2018-08-25: 20 mg via ORAL

## 2018-08-25 MED ORDER — EPINEPHRINE 30 MG/30ML IJ SOLN
INTRAMUSCULAR | Status: AC
  Filled 2018-08-25: qty 1

## 2018-08-25 MED ORDER — LIDOCAINE HCL 4 % MT SOLN
OROMUCOSAL | Status: DC | PRN
  Administered 2018-08-25: 4 mL via TOPICAL

## 2018-08-25 MED ORDER — SUGAMMADEX SODIUM 200 MG/2ML IV SOLN
INTRAVENOUS | Status: DC | PRN
  Administered 2018-08-25: 200 mg via INTRAVENOUS

## 2018-08-25 MED ORDER — LIDOCAINE HCL (PF) 2 % IJ SOLN
INTRAMUSCULAR | Status: AC
  Filled 2018-08-25: qty 10

## 2018-08-25 MED ORDER — BUPIVACAINE LIPOSOME 1.3 % IJ SUSP
INTRAMUSCULAR | Status: DC | PRN
  Administered 2018-08-25: 20 mL via PERINEURAL

## 2018-08-25 MED ORDER — FENTANYL CITRATE (PF) 100 MCG/2ML IJ SOLN: 25.0000 ug | INTRAMUSCULAR | Status: DC | PRN

## 2018-08-25 MED ORDER — DEXAMETHASONE SODIUM PHOSPHATE 10 MG/ML IJ SOLN
INTRAMUSCULAR | Status: AC
  Filled 2018-08-25: qty 1

## 2018-08-25 MED ORDER — ROCURONIUM BROMIDE 100 MG/10ML IV SOLN
INTRAVENOUS | Status: DC | PRN
  Administered 2018-08-25: 5 mg via INTRAVENOUS
  Administered 2018-08-25: 45 mg via INTRAVENOUS

## 2018-08-25 MED ORDER — PROPOFOL 10 MG/ML IV BOLUS
INTRAVENOUS | Status: DC | PRN
  Administered 2018-08-25: 150 mg via INTRAVENOUS

## 2018-08-25 MED ORDER — SUGAMMADEX SODIUM 200 MG/2ML IV SOLN
INTRAVENOUS | Status: AC
  Filled 2018-08-25: qty 2

## 2018-08-25 MED ORDER — CEFAZOLIN SODIUM-DEXTROSE 2-4 GM/100ML-% IV SOLN
INTRAVENOUS | Status: AC
  Filled 2018-08-25: qty 100

## 2018-08-25 MED ORDER — BUPIVACAINE HCL (PF) 0.25 % IJ SOLN
INTRAMUSCULAR | Status: AC
  Filled 2018-08-25: qty 30

## 2018-08-25 MED ORDER — EPHEDRINE SULFATE 50 MG/ML IJ SOLN
INTRAMUSCULAR | Status: DC | PRN
  Administered 2018-08-25: 5 mg via INTRAVENOUS

## 2018-08-25 MED ORDER — OXYCODONE HCL 5 MG PO TABS: 5.0000 mg | ORAL_TABLET | ORAL | 0 refills | Status: DC | PRN

## 2018-08-25 MED ORDER — SUCCINYLCHOLINE CHLORIDE 20 MG/ML IJ SOLN
INTRAMUSCULAR | Status: DC | PRN
  Administered 2018-08-25: 100 mg via INTRAVENOUS

## 2018-08-25 MED ORDER — LIDOCAINE HCL (PF) 1 % IJ SOLN
INTRAMUSCULAR | Status: DC | PRN
  Administered 2018-08-25: 5 mL via SUBCUTANEOUS

## 2018-08-25 MED ORDER — LIDOCAINE HCL (PF) 1 % IJ SOLN
INTRAMUSCULAR | Status: AC
  Filled 2018-08-25: qty 30

## 2018-08-25 MED ORDER — SODIUM CHLORIDE 0.9 % IV SOLN
INTRAVENOUS | Status: DC
  Administered 2018-08-25: 07:00:00 via INTRAVENOUS

## 2018-08-25 MED ORDER — ONDANSETRON HCL 4 MG/2ML IJ SOLN
INTRAMUSCULAR | Status: DC | PRN
  Administered 2018-08-25: 4 mg via INTRAVENOUS

## 2018-08-25 MED ORDER — BUPIVACAINE HCL (PF) 0.5 % IJ SOLN
INTRAMUSCULAR | Status: AC
  Filled 2018-08-25: qty 10

## 2018-08-25 MED ORDER — PROPOFOL 10 MG/ML IV BOLUS
INTRAVENOUS | Status: AC
  Filled 2018-08-25: qty 20

## 2018-08-25 MED ORDER — LIDOCAINE HCL (PF) 1 % IJ SOLN
INTRAMUSCULAR | Status: AC
  Filled 2018-08-25: qty 5

## 2018-08-25 MED ORDER — MIDAZOLAM HCL 2 MG/2ML IJ SOLN
INTRAMUSCULAR | Status: AC
  Filled 2018-08-25: qty 2

## 2018-08-25 MED ORDER — ROCURONIUM BROMIDE 50 MG/5ML IV SOLN
INTRAVENOUS | Status: AC
  Filled 2018-08-25: qty 1

## 2018-08-25 MED ORDER — ONDANSETRON HCL 4 MG/2ML IJ SOLN: 4.0000 mg | Freq: Once | INTRAMUSCULAR | Status: DC | PRN

## 2018-08-25 MED ORDER — EPHEDRINE SULFATE 50 MG/ML IJ SOLN
INTRAMUSCULAR | Status: AC
  Filled 2018-08-25: qty 1

## 2018-08-25 MED ORDER — ONDANSETRON HCL 4 MG/2ML IJ SOLN
INTRAMUSCULAR | Status: AC
  Filled 2018-08-25: qty 2

## 2018-08-25 MED ORDER — MIDAZOLAM HCL 2 MG/2ML IJ SOLN
INTRAMUSCULAR | Status: AC
  Administered 2018-08-25: 1 mg via INTRAVENOUS
  Filled 2018-08-25: qty 2

## 2018-08-25 MED ORDER — MIDAZOLAM HCL 2 MG/2ML IJ SOLN
1.0000 mg | Freq: Once | INTRAMUSCULAR | Status: AC
  Administered 2018-08-25: 1 mg via INTRAVENOUS

## 2018-08-25 MED ORDER — DEXAMETHASONE SODIUM PHOSPHATE 10 MG/ML IJ SOLN
INTRAMUSCULAR | Status: DC | PRN
  Administered 2018-08-25: 10 mg via INTRAVENOUS

## 2018-08-25 MED ORDER — FENTANYL CITRATE (PF) 100 MCG/2ML IJ SOLN
INTRAMUSCULAR | Status: AC
  Filled 2018-08-25: qty 2

## 2018-08-25 MED ORDER — FENTANYL CITRATE (PF) 100 MCG/2ML IJ SOLN
INTRAMUSCULAR | Status: DC | PRN
  Administered 2018-08-25 (×2): 50 ug via INTRAVENOUS

## 2018-08-25 MED ORDER — FAMOTIDINE 20 MG PO TABS
ORAL_TABLET | ORAL | Status: AC
  Administered 2018-08-25: 20 mg via ORAL
  Filled 2018-08-25: qty 1

## 2018-08-25 MED ORDER — MIDAZOLAM HCL 2 MG/2ML IJ SOLN
INTRAMUSCULAR | Status: DC | PRN
  Administered 2018-08-25: 2 mg via INTRAVENOUS

## 2018-08-25 MED ORDER — BUPIVACAINE LIPOSOME 1.3 % IJ SUSP
INTRAMUSCULAR | Status: AC
  Filled 2018-08-25: qty 20

## 2018-08-25 MED ORDER — ONDANSETRON HCL 4 MG PO TABS: 4.0000 mg | ORAL_TABLET | Freq: Three times a day (TID) | ORAL | 0 refills | Status: DC | PRN

## 2018-08-25 MED ORDER — BUPIVACAINE HCL (PF) 0.5 % IJ SOLN
INTRAMUSCULAR | Status: DC | PRN
  Administered 2018-08-25: 10 mL via PERINEURAL

## 2018-08-25 MED ORDER — FENTANYL CITRATE (PF) 100 MCG/2ML IJ SOLN
50.0000 ug | Freq: Once | INTRAMUSCULAR | Status: AC
  Administered 2018-08-25: 50 ug via INTRAVENOUS

## 2018-08-25 MED ORDER — FENTANYL CITRATE (PF) 100 MCG/2ML IJ SOLN
INTRAMUSCULAR | Status: AC
  Administered 2018-08-25: 50 ug via INTRAVENOUS
  Filled 2018-08-25: qty 2

## 2018-08-25 MED ORDER — LIDOCAINE HCL (CARDIAC) PF 100 MG/5ML IV SOSY
PREFILLED_SYRINGE | INTRAVENOUS | Status: DC | PRN
  Administered 2018-08-25: 100 mg via INTRAVENOUS

## 2018-08-25 SURGICAL SUPPLY — 74 items
ADAPTER IRRIG TUBE 2 SPIKE SOL (ADAPTER) ×4 IMPLANT
ADPR TBG 2 SPK PMP STRL ASCP (ADAPTER) ×2
ANCH SUT 5.5 KNTLS PEEK (Orthopedic Implant) ×2 IMPLANT
ANCH SUT Q-FX 2.8 (Anchor) ×2 IMPLANT
ANCHOR ALL-SUT Q-FIX 2.8 (Anchor) ×2 IMPLANT
ANCHOR SUT 5.5 MULTIFIX (Orthopedic Implant) ×2 IMPLANT
BUR RADIUS 4.0X18.5 (BURR) ×2 IMPLANT
BUR RADIUS 5.5 (BURR) ×2 IMPLANT
CANNULA 5.75X7 CRYSTAL CLEAR (CANNULA) ×4 IMPLANT
CANNULA PARTIAL THREAD 2X7 (CANNULA) ×2 IMPLANT
CANNULA TWIST IN 8.25X9CM (CANNULA) IMPLANT
CONNECTOR PERFECT PASSER (CONNECTOR) ×2 IMPLANT
COOLER POLAR GLACIER W/PUMP (MISCELLANEOUS) ×2 IMPLANT
COVER WAND RF STERILE (DRAPES) ×2 IMPLANT
CRADLE LAMINECT ARM (MISCELLANEOUS) ×2 IMPLANT
DEVICE SUCT BLK HOLE OR FLOOR (MISCELLANEOUS) IMPLANT
DRAPE IMP U-DRAPE 54X76 (DRAPES) ×4 IMPLANT
DRAPE INCISE IOBAN 66X45 STRL (DRAPES) ×2 IMPLANT
DRAPE SHEET LG 3/4 BI-LAMINATE (DRAPES) ×2 IMPLANT
DRAPE U-SHAPE 47X51 STRL (DRAPES) IMPLANT
DURAPREP 26ML APPLICATOR (WOUND CARE) ×6 IMPLANT
ELECT REM PT RETURN 9FT ADLT (ELECTROSURGICAL) ×2
ELECTRODE REM PT RTRN 9FT ADLT (ELECTROSURGICAL) ×1 IMPLANT
GAUZE PETRO XEROFOAM 1X8 (MISCELLANEOUS) ×2 IMPLANT
GAUZE SPONGE 4X4 12PLY STRL (GAUZE/BANDAGES/DRESSINGS) ×4 IMPLANT
GLOVE BIOGEL PI IND STRL 9 (GLOVE) ×1 IMPLANT
GLOVE BIOGEL PI INDICATOR 9 (GLOVE) ×1
GLOVE SURG 9.0 ORTHO LTXF (GLOVE) ×4 IMPLANT
GOWN STRL REUS TWL 2XL XL LVL4 (GOWN DISPOSABLE) ×2 IMPLANT
GOWN STRL REUS W/ TWL LRG LVL3 (GOWN DISPOSABLE) ×1 IMPLANT
GOWN STRL REUS W/ TWL LRG LVL4 (GOWN DISPOSABLE) ×1 IMPLANT
GOWN STRL REUS W/TWL LRG LVL3 (GOWN DISPOSABLE) ×2
GOWN STRL REUS W/TWL LRG LVL4 (GOWN DISPOSABLE) ×2
IV LACTATED RINGER IRRG 3000ML (IV SOLUTION) ×12
IV LR IRRIG 3000ML ARTHROMATIC (IV SOLUTION) ×6 IMPLANT
KIT STABILIZATION SHOULDER (MISCELLANEOUS) ×2 IMPLANT
KIT SUTURE 2.8 Q-FIX DISP (MISCELLANEOUS) ×1 IMPLANT
KIT SUTURETAK 3.0 INSERT PERC (KITS) IMPLANT
KIT TURNOVER KIT A (KITS) ×2 IMPLANT
MANIFOLD NEPTUNE II (INSTRUMENTS) ×2 IMPLANT
MASK FACE SPIDER DISP (MASK) ×2 IMPLANT
MAT ABSORB  FLUID 56X50 GRAY (MISCELLANEOUS) ×2
MAT ABSORB FLUID 56X50 GRAY (MISCELLANEOUS) ×2 IMPLANT
NDL SAFETY ECLIPSE 18X1.5 (NEEDLE) ×1 IMPLANT
NEEDLE HYPO 18GX1.5 SHARP (NEEDLE) ×2
NEEDLE HYPO 22GX1.5 SAFETY (NEEDLE) ×2 IMPLANT
NS IRRIG 500ML POUR BTL (IV SOLUTION) ×2 IMPLANT
PACK ARTHROSCOPY SHOULDER (MISCELLANEOUS) ×2 IMPLANT
PAD WRAPON POLAR SHDR XLG (MISCELLANEOUS) ×1 IMPLANT
PASSER SUT CAPTURE FIRST (SUTURE) ×2 IMPLANT
SET TUBE SUCT SHAVER OUTFL 24K (TUBING) ×2 IMPLANT
SET TUBE TIP INTRA-ARTICULAR (MISCELLANEOUS) ×2 IMPLANT
STRAP SAFETY 5IN WIDE (MISCELLANEOUS) ×2 IMPLANT
STRIP CLOSURE SKIN 1/2X4 (GAUZE/BANDAGES/DRESSINGS) ×4 IMPLANT
SUT ETHILON 4-0 (SUTURE) ×2
SUT ETHILON 4-0 FS2 18XMFL BLK (SUTURE) ×1
SUT LASSO 90 DEG SD STR (SUTURE) IMPLANT
SUT MNCRL 4-0 (SUTURE) ×2
SUT MNCRL 4-0 27XMFL (SUTURE) ×1
SUT PDS AB 0 CT1 27 (SUTURE) ×2 IMPLANT
SUT PERFECTPASSER WHITE CART (SUTURE) ×6 IMPLANT
SUT SMART STITCH CARTRIDGE (SUTURE) ×2 IMPLANT
SUT VIC AB 0 CT1 36 (SUTURE) ×2 IMPLANT
SUT VIC AB 2-0 CT2 27 (SUTURE) ×2 IMPLANT
SUTURE ETHLN 4-0 FS2 18XMF BLK (SUTURE) ×1 IMPLANT
SUTURE MAGNUM WIRE 2X48 BLK (SUTURE) IMPLANT
SUTURE MNCRL 4-0 27XMF (SUTURE) ×1 IMPLANT
SYR 10ML LL (SYRINGE) ×2 IMPLANT
TAPE MICROFOAM 4IN (TAPE) ×2 IMPLANT
TUBING ARTHRO INFLOW-ONLY STRL (TUBING) ×2 IMPLANT
TUBING CONNECTING 10 (TUBING) ×2 IMPLANT
WAND HAND CNTRL MULTIVAC 90 (MISCELLANEOUS) ×1 IMPLANT
WAND WEREWOLF FLOW 90D (MISCELLANEOUS) ×2 IMPLANT
WRAPON POLAR PAD SHDR XLG (MISCELLANEOUS) ×2

## 2018-08-25 NOTE — Anesthesia Postprocedure Evaluation (Signed)
Anesthesia Post Note  Patient: Jeremy Dorsey  Procedure(s) Performed: SHOULDER ARTHROSCOPY WITH MINI OPEN ROTATOR CUFF REPAIR (Right Shoulder)  Patient location during evaluation: PACU Anesthesia Type: General Level of consciousness: awake and alert Pain management: pain level controlled Vital Signs Assessment: post-procedure vital signs reviewed and stable Respiratory status: spontaneous breathing, nonlabored ventilation, respiratory function stable and patient connected to nasal cannula oxygen Cardiovascular status: blood pressure returned to baseline and stable Postop Assessment: no apparent nausea or vomiting Anesthetic complications: no     Last Vitals:  Vitals:   08/25/18 1123 08/25/18 1213  BP: 114/75 (P) 125/72  Pulse: 76 (P) 62  Resp: 18 (P) 18  Temp: (!) 36.2 C (P) 36.5 C  SpO2: 96% (P) 98%    Last Pain:  Vitals:   08/25/18 1213  TempSrc: (P) Oral  PainSc:                  Martha Clan

## 2018-08-25 NOTE — Discharge Instructions (Signed)
Shoulder Arthroscopy, Care After This sheet gives you information about how to care for yourself after your procedure. Your health care provider may also give you more specific instructions. If you have problems or questions, contact your health care provider. What can I expect after the procedure? After the procedure, it is common to have:  Pain that can be relieved by taking pain medicine.  Swelling.  A small amount of fluid from the incision.  Stiffness that improves over time. Follow these instructions at home: If you have a sling or immobilizer:  Wear the sling or immobilizer as told by your health care provider. Remove it only as told by your health care provider. These devices protect your shoulder and help it heal by keeping it in place.  Loosen the sling or immobilizer if your fingers tingle, become numb, or turn cold and blue.  Keep the sling or immobilizer clean.  Ask if you may remove the sling or immobilizer for bathing. If you need to keep it on while bathing and it is not waterproof: ? Do not let it get wet. ? Cover it with a watertight covering when you take a bath or a shower. Incision care   Follow instructions from your health care provider about how to take care of your incisions. Make sure you: ? Wash your hands with soap and water before you change your bandage (dressing). If soap and water are not available, use hand sanitizer. ? Change your dressing as told by your health care provider. ? Leave stitches (sutures), staples, skin glue, or adhesive strips in place. These skin closures may need to stay in place for 2 weeks or longer. If adhesive strip edges start to loosen and curl up, you may trim the loose edges. Do not remove adhesive strips completely unless your health care provider tells you to do that.  Check your incision areas every day for signs of infection. Check for: ? Redness ? More swelling or pain. ? Blood or more fluid. ? Warmth. ? Pus or a  bad smell. Bathing  Do not take baths, swim, or use a hot tub until your health care provider approves. Ask your health care provider if you may take showers. You may only be allowed to take sponge baths. Activity  Ask your health care provider what activities are safe for you during recovery, and ask what activities you need to avoid.  Do not lift with your affected shoulder until your health care provider approves.  Avoid pulling and pushing with the arm on your affected side.  If physical therapy was prescribed, do exercises as directed. Doing exercises may help to improve shoulder movement and flexibility (range of motion). Driving  Do not drive until your health care provider approves.  Do not drive or use heavy machinery while taking prescription pain medicine. Managing pain, stiffness, and swelling   If lying down flat causes shoulder discomfort, it may help to sleep in a sitting position for a few days after your procedure. Try sleeping in a reclining chair or propping yourself up with extra pillows in bed.  If directed, put ice on the affected area: ? Put ice in a plastic bag or use the icing device (cold therapy unit) that you were given. Follow instructions from your health care provider about how to use the icing device. ? Place a towel between your skin and the bag or between your skin and the icing device. ? Leave the ice on for 20 minutes, 2-3 times  a day.  Move your fingers often to avoid stiffness and to lessen swelling. General instructions  Take over-the-counter and prescription medicines only as told by your health care provider.  If you are taking prescription pain medicine, take actions to prevent or treat constipation. Your health care provider may recommend that you: ? Drink enough fluid to keep your urine pale yellow. ? Eat foods that are high in fiber, such as fresh fruits and vegetables, whole grains, and beans. ? Limit foods that are high in fat and  processed sugars, such as fried or sweet foods. ? Take an over-the-counter or prescription medicine for constipation.  Do not use any products that contain nicotine or tobacco, such as cigarettes and e-cigarettes. These can delay incision or bone healing. If you need help quitting, ask your health care provider.  Keep all follow-up visits as told by your health care provider. This is important. Contact a health care provider if you:  Have a fever.  Have severe pain.  Have redness around an incision.  Have more swelling or pain in an incision area.  Have blood or more fluid coming from an incision.  Notice that an incision feels warm to the touch.  Notice pus or a bad smell coming from an incision.  Notice that an incision has opened up.  Develop a rash. Get help right away if you:  Have difficulty breathing.  Have chest pain.  Notice that your fingers tingle, are numb, or are cold and blue even after you loosen your sling or immobilizer.  Develop pain in your lower leg or at the back of your knee. Summary  If you have a sling or immobilizer, wear it as told by your health care provider. These devices protect your shoulder and help it heal by keeping it in place.  If lying down flat causes shoulder discomfort, it may help to sleep in a sitting position for a few days after your procedure. Try sleeping in a reclining chair, or try propping yourself up with extra pillows in bed.  If physical therapy was prescribed, do exercises as directed. Doing exercises may help to improve shoulder movement and flexibility (range of motion).  Keep all follow-up visits as told by your health care provider. This is important. This information is not intended to replace advice given to you by your health care provider. Make sure you discuss any questions you have with your health care provider. Document Released: 02/14/2014 Document Revised: 06/04/2017 Document Reviewed: 06/04/2017 Elsevier  Interactive Patient Education  2019 [place]   1) The drugs that you were given will stay in your system until tomorrow so for the next 24 hours you should not:  A) Drive an automobile B) Make any legal decisions C) Drink any alcoholic beverage   2) You may resume regular meals tomorrow.  Today it is better to start with liquids and gradually work up to solid foods.  You may eat anything you prefer, but it is better to start with liquids, then soup and crackers, and gradually work up to solid foods.   3) Please notify your doctor immediately if you have any unusual bleeding, trouble breathing, redness and pain at the surgery site, drainage, fever, or pain not relieved by medication.    4) Additional Instructions:        Please contact your physician with any problems or Same Day Surgery at [phone], Monday through Friday 6 am to 4 pm, or  [facility] number at [phone].

## 2018-08-25 NOTE — Anesthesia Preprocedure Evaluation (Signed)
Anesthesia Evaluation  Patient identified by MRN, date of birth, ID band Patient awake    Reviewed: Allergy & Precautions, NPO status , Patient's Chart, lab work & pertinent test results  History of Anesthesia Complications Negative for: history of anesthetic complications  Airway Mallampati: III  TM Distance: >3 FB Neck ROM: Full    Dental  (+) Implants   Pulmonary neg sleep apnea, neg COPD, former smoker,    breath sounds clear to auscultation- rhonchi (-) wheezing      Cardiovascular Exercise Tolerance: Good hypertension, Pt. on medications (-) CAD, (-) Past MI, (-) Cardiac Stents and (-) CABG  Rhythm:Regular Rate:Normal - Systolic murmurs and - Diastolic murmurs    Neuro/Psych neg Seizures negative neurological ROS  negative psych ROS   GI/Hepatic negative GI ROS, Neg liver ROS,   Endo/Other  diabetes, Oral Hypoglycemic Agents  Renal/GU Renal InsufficiencyRenal disease     Musculoskeletal negative musculoskeletal ROS (+)   Abdominal (+) + obese,   Peds  Hematology negative hematology ROS (+)   Anesthesia Other Findings Past Medical History: 2008: Diabetes mellitus without complication ([place]) 7225: Hypertension No date: Renal failure   Reproductive/Obstetrics                             Anesthesia Physical  Anesthesia Plan  ASA: III  Anesthesia Plan: General   Post-op Pain Management:  Regional for Post-op pain   Induction: Intravenous  PONV Risk Score and Plan: 2 and Ondansetron, Midazolam and Dexamethasone  Airway Management Planned: Oral ETT  Additional Equipment:   Intra-op Plan:   Post-operative Plan: Extubation in OR  Informed Consent: I have reviewed the patients History and Physical, chart, labs and discussed the procedure including the risks, benefits and alternatives for the proposed anesthesia with the patient or authorized representative who has indicated  his/her understanding and acceptance.     Dental advisory given  Plan Discussed with: CRNA and Anesthesiologist  Anesthesia Plan Comments: (Pt hyperkalemic on arrival this morning, plan to treat with multiple agents to lower potassium prior to proceeding with surgery)        Anesthesia Quick Evaluation

## 2018-08-25 NOTE — Anesthesia Post-op Follow-up Note (Signed)
Anesthesia QCD[facility] form completed.        

## 2018-08-25 NOTE — H&P (Signed)
PREOPERATIVE H&P  Chief Complaint: FULL THICKNESS RIGHT ROTATOR CUFF TEAR, RIGHT SHOULDER  HPI: Jeremy Dorsey is a 68 y.o. male who presents for preoperative history and physical with a diagnosis of FULL THICKNESS RIGHT ROTATOR CUFF TEAR, RIGHT SHOULDER. Symptoms are rated as moderate to severe, and have been worsening.  This is significantly impairing activities of daily living.  He has elected for surgical management.   Past Medical History:  Diagnosis Date  . Diabetes mellitus without complication ([place]) 2536  . Hypertension 2008  . Lower extremity edema   . Renal failure   . Shoulder pain    Right  . Urinary complication    Past Surgical History:  Procedure Laterality Date  . COLONOSCOPY  1990  . COLONOSCOPY  06/09/2011   Dr Bary Castilla  . CYSTOSCOPY W/ RETROGRADES Bilateral 08/01/2018   Procedure: CYSTOSCOPY WITH RETROGRADE PYELOGRAM;  Surgeon: Billey Co, MD;  Location: [facility] ORS;  Service: Urology;  Laterality: Bilateral;  . CYSTOSCOPY WITH BIOPSY N/A 08/01/2018   Procedure: CYSTOSCOPY WITH Bladder BIOPSY;  Surgeon: Billey Co, MD;  Location: [facility] ORS;  Service: Urology;  Laterality: N/A;  . CYSTOSCOPY WITH STENT PLACEMENT Right 08/01/2018   Procedure: CYSTOSCOPY WITH STENT PLACEMENT;  Surgeon: Billey Co, MD;  Location: [facility] ORS;  Service: Urology;  Laterality: Right;  . EYE SURGERY Right    laser surgery  . TRANSURETHRAL RESECTION OF BLADDER TUMOR N/A 08/01/2018   Procedure: TRANSURETHRAL RESECTION OF BLADDER TUMOR (TURBT);  Surgeon: Billey Co, MD;  Location: [facility] ORS;  Service: Urology;  Laterality: N/A;  . URETEROSCOPY WITH HOLMIUM LASER LITHOTRIPSY Right 08/01/2018   Procedure: URETEROSCOPY WITH HOLMIUM LASER LITHOTRIPSY;  Surgeon: Billey Co, MD;  Location: [facility] ORS;  Service: Urology;  Laterality: Right;   Social History   Socioeconomic History  . Marital status: Married    Spouse name: Not on file  . Number of children: Not on file   . Years of education: Not on file  . Highest education level: Not on file  Occupational History  . Not on file  Social Needs  . Financial resource strain: Not on file  . Food insecurity:    Worry: Not on file    Inability: Not on file  . Transportation needs:    Medical: Not on file    Non-medical: Not on file  Tobacco Use  . Smoking status: Former Smoker    Packs/day: 1.00    Years: 30.00    Pack years: 30.00    Last attempt to quit: 08/23/2007    Years since quitting: 11.0  . Smokeless tobacco: Never Used  Substance and Sexual Activity  . Alcohol use: No  . Drug use: No  . Sexual activity: Yes    Birth control/protection: None  Lifestyle  . Physical activity:    Days per week: Not on file    Minutes per session: Not on file  . Stress: Not on file  Relationships  . Social connections:    Talks on phone: Not on file    Gets together: Not on file    Attends religious service: Not on file    Active member of club or organization: Not on file    Attends meetings of clubs or organizations: Not on file    Relationship status: Not on file  Other Topics Concern  . Not on file  Social History Narrative  . Not on file   Family History  Problem Relation Age of Onset  .  Psoriasis Mother   . Heart failure Father    Allergies  Allergen Reactions  . Oxycodone Nausea And Vomiting  . Hydrocodone Nausea And Vomiting   Prior to Admission medications   Medication Sig Start Date End Date Taking? Authorizing Provider  acetaminophen (TYLENOL) 500 MG tablet Take 1,000 mg by mouth daily as needed for moderate pain or headache.   Yes [provider]  amLODipine (NORVASC) 10 MG tablet Take 10 mg by mouth daily.  08/10/16  Yes [provider]  calcitRIOL (ROCALTROL) 0.25 MCG capsule Take 0.25 mcg by mouth every other day. 09/08/16  Yes [provider]  diphenhydrAMINE (BENADRYL) 25 MG tablet Take 25 mg by mouth daily.   Yes [provider]  furosemide  (LASIX) 40 MG tablet Take 40 mg by mouth 2 (two) times daily.  03/30/16  Yes [provider]  glipiZIDE (GLUCOTROL) 10 MG tablet Take 1 tablet (10 mg total) by mouth 2 (two) times daily before a meal. Take 30 minutes before a meal. Patient taking differently: Take 5 mg by mouth 2 (two) times daily before a meal. Take 30 minutes before a meal. 02/22/17  Yes Carmon Ginsberg, PA  patiromer (VELTASSA) 8.4 g packet Take 8.4 g by mouth every other day.   Yes [provider]  sodium bicarbonate 650 MG tablet Take 650 mg by mouth 3 (three) times daily.    Yes [provider]  Sulfamethoxazole-Trimethoprim (SULFAMETHOXAZOLE-TMP DS PO) Take 1 tablet by mouth daily.   Yes [provider]  Tetrahydrozoline HCl (VISINE OP) Place 1 drop into both eyes daily as needed (dry eyes).   Yes [provider]  Cetirizine HCl 10 MG CAPS Take 1 capsule (10 mg total) by mouth daily. Patient not taking: Reported on 08/22/2018 08/04/18   Billey Co, MD  diazepam (VALIUM) 5 MG tablet Take 1 tablet (5 mg total) by mouth once as needed for up to 1 dose for anxiety (take 30 minutes prior to stent removal). Patient not taking: Reported on 08/22/2018 08/08/18   Billey Co, MD  oxybutynin (DITROPAN-XL) 10 MG 24 hr tablet Take 1 tablet (10 mg total) by mouth daily. Patient not taking: Reported on 08/25/2018 08/04/18   Billey Co, MD  oxyCODONE (ROXICODONE) 5 MG immediate release tablet Take 1 tablet (5 mg total) by mouth every 4 (four) hours as needed for severe pain. Patient not taking: Reported on 08/22/2018 08/01/18   Billey Co, MD  pravastatin (PRAVACHOL) 80 MG tablet TAKE 1 TABLET (80 MG TOTAL) BY MOUTH DAILY. Patient not taking: Reported on 08/22/2018 08/14/17   Carmon Ginsberg, PA     Positive ROS: All other systems have been reviewed and were otherwise negative with the exception of those mentioned in the HPI and as above.  Physical Exam: General: Alert, no acute  distress Cardiovascular: Regular rate and rhythm, no murmurs rubs or gallops.  No pedal edema Respiratory: Clear to auscultation bilaterally, no wheezes rales or rhonchi. No cyanosis, no use of accessory musculature GI: No organomegaly, abdomen is soft and non-tender nondistended with positive bowel sounds. Skin: Skin intact, no lesions within the operative field. Neurologic: Sensation intact distally Psychiatric: Patient is competent for consent with normal mood and affect Lymphatic: No cervical lymphadenopathy  MUSCULOSKELETAL: Right shoulder: Patient skin is intact.  There is no erythema, ecchymosis or swelling.  Patient demonstrates no muscle atrophy.  Patient has pain with forward elevation and abduction above 90 degrees.  He has pain with a  downward directed force on his abducted shoulder.  He demonstrates mild weakness to shoulder abduction and external rotation.  Patient has full digital wrist and elbow range of motion, intact sensation to light touch and a palpable radial pulse.  Assessment: FULL THICKNESS RIGHT ROTATOR CUFF TEAR, RIGHT SHOULDER  Plan: Plan for Procedure(s): RIGHT SHOULDER ARTHROSCOPY WITH MINI OPEN ROTATOR CUFF REPAIR  Patient was met in the preoperative area.  He has received an interscalene block with Exparel by the anesthesia service.  His wife is at the bedside.  I explained the details of the operation as well as the postoperative course.  I answered their questions.  I discussed the risks and benefits of surgery. The risks include but are not limited to infection, bleeding, nerve or blood vessel injury, joint stiffness or loss of motion, persistent pain, weakness or instability, tear of the rotator cuff and hardware failure and the need for further surgery. Medical risks include but are not limited to DVT and pulmonary embolism, myocardial infarction, stroke, pneumonia, respiratory failure and death. Patient understood these risks and wished to proceed.     Thornton Park, MD   08/25/2018 7:45 AM

## 2018-08-25 NOTE — Anesthesia Procedure Notes (Signed)
Procedure Name: Intubation Date/Time: 08/25/2018 8:01 AM Performed by: Eben Burow, CRNA Pre-anesthesia Checklist: Patient identified, Emergency Drugs available, Suction available and Patient being monitored Patient Re-evaluated:Patient Re-evaluated prior to induction Oxygen Delivery Method: Circle system utilized Induction Type: IV induction Ventilation: Mask ventilation with difficulty, Two handed mask ventilation required and Oral airway inserted - appropriate to patient size Laryngoscope Size: McGraph and 3 Grade View: Grade I Tube type: Oral Tube size: 7.5 mm Number of attempts: 2 (Poor visualization with Miller 2) Airway Equipment and Method: Stylet,  Video-laryngoscopy and LTA kit utilized Placement Confirmation: positive ETCO2 and breath sounds checked- equal and bilateral Secured at: 23 cm Tube secured with: Tape Dental Injury: Teeth and Oropharynx as per pre-operative assessment

## 2018-08-25 NOTE — Anesthesia Procedure Notes (Signed)
Anesthesia Regional Block: Interscalene brachial plexus block   Pre-Anesthetic Checklist: ,, timeout performed, Correct Patient, Correct Site, Correct Laterality, Correct Procedure, Correct Position, site marked, Risks and benefits discussed,  Surgical consent,  Pre-op evaluation,  At surgeon's request and post-op pain management  Laterality: Right and Upper  Prep: chloraprep       Needles:  Injection technique: Single-shot  Needle Type: Stimiplex     Needle Length: 5cm  Needle Gauge: 22     Additional Needles:   Procedures:,,,, ultrasound used (permanent image in chart),,,,  Narrative:  Start time: 08/25/2018 7:32 AM End time: 08/25/2018 7:37 AM Injection made incrementally with aspirations every 5 mL.  Performed by: Personally  Anesthesiologist: Martha Clan, MD  Additional Notes: Functioning IV was confirmed and monitors were applied.  A 32mm 22ga Stimuplex needle was used. Sterile prep and drape,hand hygiene and sterile gloves were used.  Negative aspiration and negative test dose prior to incremental administration of local anesthetic. The patient tolerated the procedure well.

## 2018-08-25 NOTE — Transfer of Care (Signed)
Immediate Anesthesia Transfer of Care Note  Patient: Jeremy Dorsey  Procedure(s) Performed: SHOULDER ARTHROSCOPY WITH MINI OPEN ROTATOR CUFF REPAIR (Right Shoulder)  Patient Location: PACU  Anesthesia Type:General  Level of Consciousness: drowsy  Airway & Oxygen Therapy: Patient Spontanous Breathing and Patient connected to face mask oxygen  Post-op Assessment: Report given to RN and Post -op Vital signs reviewed and stable  Post vital signs: Reviewed and stable  Last Vitals:  Vitals Value Taken Time  BP 103/60 08/25/2018 10:42 AM  Temp    Pulse 72 08/25/2018 10:45 AM  Resp 18 08/25/2018 10:45 AM  SpO2 100 % 08/25/2018 10:45 AM  Vitals shown include unvalidated device data.  Last Pain:  Vitals:   08/25/18 1042  TempSrc:   PainSc: (P) Asleep         Complications: No apparent anesthesia complications

## 2018-08-25 NOTE — Op Note (Signed)
08/25/2018  11:36 AM  PATIENT:  Jeremy Dorsey  68 y.o. male  PRE-OPERATIVE DIAGNOSIS:  FULL THICKNESS RIGHT ROTATOR CUFF TEAR  POST-OPERATIVE DIAGNOSIS:  LARGE FULL THICKNESS RIGHT ROTATOR CUFF TEAR, PARTIAL TEAR OF BICEPS TENDON, SUBACROMIAL DECOMPRESSION AND DISTAL CLAVICLE EXCISION  PROCEDURE:  Procedure(s): RIGHT SHOULDER ARTHROSCOPIC  BICEPS TENOTOMY, SUBACROMIAL DECOMPRESSION AND DISTAL CLAVICLE EXCISION WITH MINI OPEN ROTATOR CUFF REPAIR (Right)  SURGEON:  Surgeon(s) and Role:    Thornton Park, MD - Primary  ANESTHESIA:   general and paracervical block   PREOPERATIVE INDICATIONS:  DYON ROTERT is a  68 y.o. male with a diagnosis of FULL THICKNESS RIGHT ROTATOR CUFF TEAR of the RIGHT shoulder who failed conservative measures and elected for surgical management.    The risks benefits and alternatives were discussed with the patient preoperatively including but not limited to the risks of infection, bleeding, nerve injury, persistent pain or weakness, failure of the hardware, re-tear of the rotator cuff and the need for further surgery. Medical risks include DVT and pulmonary embolism, myocardial infarction, stroke, pneumonia, respiratory failure and death. Patient understood these risks and wished to proceed.  OPERATIVE IMPLANTS: [place] multifix anchors x 2 & [employer] Q Fix anchors x 2  OPERATIVE PROCEDURE: The patient was met in the preoperative area. The right shoulder was signed with the word yes and my initials according the hospital's correct site of surgery protocol.  Preop history and physical was performed at the bedside.  An interscalene block with Exparel was given by the anesthesia service in the preoperative area.  The patient was brought to the OR and underwent general endotracheal intubation by the anesthesia service.  The patient was placed in a beachchair position. A spider arm positioner was used for this case. Examination under anesthesia  revealed limited passive range of motion with forward elevation to approximately 130 to 140 degrees abduction to approximately 100 degrees.  External rotation in abduction was approximately 70 degrees and internal rotation was approximately 50 degrees.  Patient demonstrated no glenohumeral joint instability..  The patient was prepped and draped in a sterile fashion. A timeout was performed to verify the patient's name, date of birth, medical record number, correct site of surgery and correct procedure to be performed there was also used to verify the patient received antibiotics that all appropriate instruments, implants and radiographs studies were available in the room. Once all in attendance were in agreement case began.  Bony landmarks were drawn out with a surgical marker along with proposed arthroscopy incisions. These were pre-injected with 1% lidocaine plain. An 11 blade was used to establish a posterior portal through which the arthroscope was placed in the glenohumeral joint. A full diagnostic examination of the shoulder was performed. The anterior portal was established under direct visualization with an 18-gauge spinal needle.  A 5.75 mm arthroscopic cannula was placed through the anterior portal.   The intra-articular portion of the biceps tendon was found to have a partial tear involving greater than 50% of the diameter. Therefore the decision was made to perform a tenotomy. An athroscopic scissor was used to release the biceps tendon off the superior labrum. The arthroscopic shaver was then used to debride the frayed edges of the labrum. There were no anterior or superior labral tears seen.  Subscapularis tendon was intact. Patient had a full-thickness tear involving the supraspinatus and infraspinatus with retraction to the articular surface of the humeral head. There were no loose bodies within the  inferior recess and no evidence of HAGL lesion.  The arthroscope was then placed in the  subacromial space. A lateral portal was then established using an 18-gauge spinal needle for localization.   The greater tuberosity was debrided using a 5.5 mm resector shaver blade to remove all remaining foreign fibers of the rotator cuff.  Debridement was performed until punctate bleeding was seen at the greater tuberosity footprint, which will allow for rotator cuff healing.  A subacromial decompression was also performed using a 5.5 mm resector shaver blade from the lateral portal. The 5.5 mm resector shaver blade was then placed through the anterior portal and distal clavicle excision was performed. Two ArthroCare Perfect Pass sutures were placed in the lateral border of the rotator cuff tear. All arthroscopic instruments were then removed and the mini-open portion of the procedure began.    A saber-type incision was made along the lateral border of the acromion. The deltoid muscle was identified and split in line with its fibers which allowed visualization of the rotator cuff. The Perfect Pass sutures previously placed in the lateral border of the rotator cuff werealso brought out through the deltoid split. 2 additional perfect Pass sutures were placed in the lateral border of the rotator cuff for a total of 4 sutures. Two Q-Fix anchors were then placed at the articular margin of the humeral head and greater tuberosity. The four suture limbs of each of the two Q Fix anchors were passed medially through the rotator cuff using a first pass suture passer. The Perfect Pass sutures from the lateral border of the rotator cuff were then anchored to thegreater tuberosity of the humeral head using two Multifix anchors. These anchors were tensioned to allow for anatomic reduction of the rotator cuff to the greater tuberosity footprint. The medial row repair was then completed using an arthroscopic knot tying technique with the Q fix anchor sutures. Once all sutures were tied down, arthroscopic images of the  double row repair were taken with the arthroscope both externally and arthroscopically fromthe glenohumeral joint  All incisions were copiously irrigated. The deltoid fascia was repaired using a 0 Vicryl suturean interrupted fashion. The subcutaneous tissue of all incisions were closed with a 2-0 Vicryl. Skin closure for the arthroscopic incisions was performed with 4-0 nylon. The skin edges of the saber incision were approximated with a running 4-0 undyed Monocryl.A dry sterile dressing including Steri-Strips was applied . The patient was placed in an abduction sling, with a Polar Care sleeve.  All sharp and instrument counts were correct at the conclusion of the case. I was scrubbed and present for the entire case. I spoke with the patient's wife in the post-op consultation room and informed her that the case had been performed without complication and the patient was stable in recovery room.     Timoteo Gaul, MD

## 2018-09-02 DIAGNOSIS — M25611 Stiffness of right shoulder, not elsewhere classified: Secondary | ICD-10-CM | POA: Diagnosis not present

## 2018-09-02 DIAGNOSIS — M25511 Pain in right shoulder: Secondary | ICD-10-CM | POA: Diagnosis not present

## 2018-09-07 DIAGNOSIS — M25611 Stiffness of right shoulder, not elsewhere classified: Secondary | ICD-10-CM | POA: Diagnosis not present

## 2018-09-07 DIAGNOSIS — M25511 Pain in right shoulder: Secondary | ICD-10-CM | POA: Diagnosis not present

## 2018-09-09 DIAGNOSIS — M25511 Pain in right shoulder: Secondary | ICD-10-CM | POA: Diagnosis not present

## 2018-09-09 DIAGNOSIS — M25611 Stiffness of right shoulder, not elsewhere classified: Secondary | ICD-10-CM | POA: Diagnosis not present

## 2018-09-14 DIAGNOSIS — M25611 Stiffness of right shoulder, not elsewhere classified: Secondary | ICD-10-CM | POA: Diagnosis not present

## 2018-09-14 DIAGNOSIS — M25511 Pain in right shoulder: Secondary | ICD-10-CM | POA: Diagnosis not present

## 2018-09-15 ENCOUNTER — Encounter: Payer: Self-pay | Admitting: Urology

## 2018-09-15 ENCOUNTER — Ambulatory Visit: Payer: PPO | Admitting: Urology

## 2018-09-16 DIAGNOSIS — M25611 Stiffness of right shoulder, not elsewhere classified: Secondary | ICD-10-CM | POA: Diagnosis not present

## 2018-09-16 DIAGNOSIS — M25511 Pain in right shoulder: Secondary | ICD-10-CM | POA: Diagnosis not present

## 2018-09-19 DIAGNOSIS — M25511 Pain in right shoulder: Secondary | ICD-10-CM | POA: Diagnosis not present

## 2018-09-19 DIAGNOSIS — M25611 Stiffness of right shoulder, not elsewhere classified: Secondary | ICD-10-CM | POA: Diagnosis not present

## 2018-09-21 DIAGNOSIS — M25511 Pain in right shoulder: Secondary | ICD-10-CM | POA: Diagnosis not present

## 2018-09-21 DIAGNOSIS — M25611 Stiffness of right shoulder, not elsewhere classified: Secondary | ICD-10-CM | POA: Diagnosis not present

## 2018-09-26 DIAGNOSIS — M25611 Stiffness of right shoulder, not elsewhere classified: Secondary | ICD-10-CM | POA: Diagnosis not present

## 2018-09-26 DIAGNOSIS — M25511 Pain in right shoulder: Secondary | ICD-10-CM | POA: Diagnosis not present

## 2018-09-28 DIAGNOSIS — M25611 Stiffness of right shoulder, not elsewhere classified: Secondary | ICD-10-CM | POA: Diagnosis not present

## 2018-09-28 DIAGNOSIS — M25511 Pain in right shoulder: Secondary | ICD-10-CM | POA: Diagnosis not present

## 2018-10-03 DIAGNOSIS — M25511 Pain in right shoulder: Secondary | ICD-10-CM | POA: Diagnosis not present

## 2018-10-03 DIAGNOSIS — M25611 Stiffness of right shoulder, not elsewhere classified: Secondary | ICD-10-CM | POA: Diagnosis not present

## 2018-10-05 DIAGNOSIS — M25511 Pain in right shoulder: Secondary | ICD-10-CM | POA: Diagnosis not present

## 2018-10-05 DIAGNOSIS — M25611 Stiffness of right shoulder, not elsewhere classified: Secondary | ICD-10-CM | POA: Diagnosis not present

## 2018-10-12 DIAGNOSIS — M25511 Pain in right shoulder: Secondary | ICD-10-CM | POA: Diagnosis not present

## 2018-10-12 DIAGNOSIS — M25611 Stiffness of right shoulder, not elsewhere classified: Secondary | ICD-10-CM | POA: Diagnosis not present

## 2018-10-13 DIAGNOSIS — N3080 Other cystitis without hematuria: Secondary | ICD-10-CM | POA: Diagnosis not present

## 2018-10-13 DIAGNOSIS — Z6835 Body mass index (BMI) 35.0-35.9, adult: Secondary | ICD-10-CM | POA: Diagnosis not present

## 2018-10-13 DIAGNOSIS — E1122 Type 2 diabetes mellitus with diabetic chronic kidney disease: Secondary | ICD-10-CM | POA: Diagnosis not present

## 2018-10-13 DIAGNOSIS — I129 Hypertensive chronic kidney disease with stage 1 through stage 4 chronic kidney disease, or unspecified chronic kidney disease: Secondary | ICD-10-CM | POA: Diagnosis not present

## 2018-10-13 DIAGNOSIS — R809 Proteinuria, unspecified: Secondary | ICD-10-CM | POA: Diagnosis not present

## 2018-10-13 DIAGNOSIS — N184 Chronic kidney disease, stage 4 (severe): Secondary | ICD-10-CM | POA: Diagnosis not present

## 2018-10-13 DIAGNOSIS — E875 Hyperkalemia: Secondary | ICD-10-CM | POA: Diagnosis not present

## 2018-10-13 DIAGNOSIS — N2581 Secondary hyperparathyroidism of renal origin: Secondary | ICD-10-CM | POA: Diagnosis not present

## 2018-10-14 DIAGNOSIS — M25511 Pain in right shoulder: Secondary | ICD-10-CM | POA: Diagnosis not present

## 2018-10-14 DIAGNOSIS — M25611 Stiffness of right shoulder, not elsewhere classified: Secondary | ICD-10-CM | POA: Diagnosis not present

## 2018-10-17 DIAGNOSIS — M25511 Pain in right shoulder: Secondary | ICD-10-CM | POA: Diagnosis not present

## 2018-10-17 DIAGNOSIS — M25611 Stiffness of right shoulder, not elsewhere classified: Secondary | ICD-10-CM | POA: Diagnosis not present

## 2018-10-19 DIAGNOSIS — M25611 Stiffness of right shoulder, not elsewhere classified: Secondary | ICD-10-CM | POA: Diagnosis not present

## 2018-10-19 DIAGNOSIS — M25511 Pain in right shoulder: Secondary | ICD-10-CM | POA: Diagnosis not present

## 2018-10-24 DIAGNOSIS — M25611 Stiffness of right shoulder, not elsewhere classified: Secondary | ICD-10-CM | POA: Diagnosis not present

## 2018-10-24 DIAGNOSIS — M25511 Pain in right shoulder: Secondary | ICD-10-CM | POA: Diagnosis not present

## 2018-10-26 DIAGNOSIS — M25511 Pain in right shoulder: Secondary | ICD-10-CM | POA: Diagnosis not present

## 2018-10-26 DIAGNOSIS — M25611 Stiffness of right shoulder, not elsewhere classified: Secondary | ICD-10-CM | POA: Diagnosis not present

## 2018-10-31 DIAGNOSIS — M25511 Pain in right shoulder: Secondary | ICD-10-CM | POA: Diagnosis not present

## 2018-10-31 DIAGNOSIS — M25611 Stiffness of right shoulder, not elsewhere classified: Secondary | ICD-10-CM | POA: Diagnosis not present

## 2018-11-02 DIAGNOSIS — Z125 Encounter for screening for malignant neoplasm of prostate: Secondary | ICD-10-CM | POA: Diagnosis not present

## 2018-11-02 DIAGNOSIS — M25611 Stiffness of right shoulder, not elsewhere classified: Secondary | ICD-10-CM | POA: Diagnosis not present

## 2018-11-02 DIAGNOSIS — I350 Nonrheumatic aortic (valve) stenosis: Secondary | ICD-10-CM | POA: Diagnosis not present

## 2018-11-02 DIAGNOSIS — M25511 Pain in right shoulder: Secondary | ICD-10-CM | POA: Diagnosis not present

## 2018-11-02 DIAGNOSIS — Z0000 Encounter for general adult medical examination without abnormal findings: Secondary | ICD-10-CM | POA: Diagnosis not present

## 2018-11-10 DIAGNOSIS — M25611 Stiffness of right shoulder, not elsewhere classified: Secondary | ICD-10-CM | POA: Diagnosis not present

## 2018-11-10 DIAGNOSIS — E039 Hypothyroidism, unspecified: Secondary | ICD-10-CM | POA: Diagnosis not present

## 2018-11-10 DIAGNOSIS — E069 Thyroiditis, unspecified: Secondary | ICD-10-CM | POA: Diagnosis not present

## 2018-11-10 DIAGNOSIS — H353211 Exudative age-related macular degeneration, right eye, with active choroidal neovascularization: Secondary | ICD-10-CM | POA: Diagnosis not present

## 2018-11-10 DIAGNOSIS — M25511 Pain in right shoulder: Secondary | ICD-10-CM | POA: Diagnosis not present

## 2018-11-16 DIAGNOSIS — H3530 Unspecified macular degeneration: Secondary | ICD-10-CM | POA: Diagnosis not present

## 2018-11-16 DIAGNOSIS — S0101XD Laceration without foreign body of scalp, subsequent encounter: Secondary | ICD-10-CM | POA: Diagnosis not present

## 2018-11-16 DIAGNOSIS — I129 Hypertensive chronic kidney disease with stage 1 through stage 4 chronic kidney disease, or unspecified chronic kidney disease: Secondary | ICD-10-CM | POA: Diagnosis not present

## 2018-11-16 DIAGNOSIS — Z4889 Encounter for other specified surgical aftercare: Secondary | ICD-10-CM | POA: Diagnosis not present

## 2018-11-16 DIAGNOSIS — E349 Endocrine disorder, unspecified: Secondary | ICD-10-CM | POA: Diagnosis not present

## 2018-11-16 DIAGNOSIS — M5137 Other intervertebral disc degeneration, lumbosacral region: Secondary | ICD-10-CM | POA: Diagnosis not present

## 2018-11-16 DIAGNOSIS — E039 Hypothyroidism, unspecified: Secondary | ICD-10-CM | POA: Diagnosis not present

## 2018-11-16 DIAGNOSIS — M48061 Spinal stenosis, lumbar region without neurogenic claudication: Secondary | ICD-10-CM | POA: Diagnosis not present

## 2018-11-16 DIAGNOSIS — E538 Deficiency of other specified B group vitamins: Secondary | ICD-10-CM | POA: Diagnosis not present

## 2018-11-16 DIAGNOSIS — F419 Anxiety disorder, unspecified: Secondary | ICD-10-CM | POA: Diagnosis not present

## 2018-11-16 DIAGNOSIS — F22 Delusional disorders: Secondary | ICD-10-CM | POA: Diagnosis not present

## 2018-11-16 DIAGNOSIS — D500 Iron deficiency anemia secondary to blood loss (chronic): Secondary | ICD-10-CM | POA: Diagnosis not present

## 2018-11-16 DIAGNOSIS — R1310 Dysphagia, unspecified: Secondary | ICD-10-CM | POA: Diagnosis not present

## 2018-11-16 DIAGNOSIS — D631 Anemia in chronic kidney disease: Secondary | ICD-10-CM | POA: Diagnosis not present

## 2018-11-16 DIAGNOSIS — N179 Acute kidney failure, unspecified: Secondary | ICD-10-CM | POA: Diagnosis not present

## 2018-11-16 DIAGNOSIS — Z96641 Presence of right artificial hip joint: Secondary | ICD-10-CM | POA: Diagnosis not present

## 2018-11-16 DIAGNOSIS — E1122 Type 2 diabetes mellitus with diabetic chronic kidney disease: Secondary | ICD-10-CM | POA: Diagnosis not present

## 2018-11-16 DIAGNOSIS — Z9181 History of falling: Secondary | ICD-10-CM | POA: Diagnosis not present

## 2018-11-16 DIAGNOSIS — D7289 Other specified disorders of white blood cells: Secondary | ICD-10-CM | POA: Diagnosis not present

## 2018-11-16 DIAGNOSIS — I4892 Unspecified atrial flutter: Secondary | ICD-10-CM | POA: Diagnosis not present

## 2018-11-16 DIAGNOSIS — M25511 Pain in right shoulder: Secondary | ICD-10-CM | POA: Diagnosis not present

## 2018-11-16 DIAGNOSIS — N183 Chronic kidney disease, stage 3 unspecified: Secondary | ICD-10-CM | POA: Diagnosis not present

## 2018-11-16 DIAGNOSIS — G9340 Encephalopathy, unspecified: Secondary | ICD-10-CM | POA: Diagnosis not present

## 2018-11-16 DIAGNOSIS — I10 Essential (primary) hypertension: Secondary | ICD-10-CM | POA: Diagnosis not present

## 2018-11-16 DIAGNOSIS — J449 Chronic obstructive pulmonary disease, unspecified: Secondary | ICD-10-CM | POA: Diagnosis not present

## 2018-11-16 DIAGNOSIS — M25611 Stiffness of right shoulder, not elsewhere classified: Secondary | ICD-10-CM | POA: Diagnosis not present

## 2018-11-16 DIAGNOSIS — S72144D Nondisplaced intertrochanteric fracture of right femur, subsequent encounter for closed fracture with routine healing: Secondary | ICD-10-CM | POA: Diagnosis not present

## 2018-11-16 DIAGNOSIS — R410 Disorientation, unspecified: Secondary | ICD-10-CM | POA: Diagnosis not present

## 2018-11-16 DIAGNOSIS — Z8719 Personal history of other diseases of the digestive system: Secondary | ICD-10-CM | POA: Diagnosis not present

## 2018-11-29 DIAGNOSIS — M25511 Pain in right shoulder: Secondary | ICD-10-CM | POA: Diagnosis not present

## 2018-11-29 DIAGNOSIS — M25611 Stiffness of right shoulder, not elsewhere classified: Secondary | ICD-10-CM | POA: Diagnosis not present

## 2018-12-22 DIAGNOSIS — Z6835 Body mass index (BMI) 35.0-35.9, adult: Secondary | ICD-10-CM | POA: Diagnosis not present

## 2018-12-22 DIAGNOSIS — N2581 Secondary hyperparathyroidism of renal origin: Secondary | ICD-10-CM | POA: Diagnosis not present

## 2018-12-22 DIAGNOSIS — E1122 Type 2 diabetes mellitus with diabetic chronic kidney disease: Secondary | ICD-10-CM | POA: Diagnosis not present

## 2018-12-22 DIAGNOSIS — R809 Proteinuria, unspecified: Secondary | ICD-10-CM | POA: Diagnosis not present

## 2018-12-22 DIAGNOSIS — I129 Hypertensive chronic kidney disease with stage 1 through stage 4 chronic kidney disease, or unspecified chronic kidney disease: Secondary | ICD-10-CM | POA: Diagnosis not present

## 2018-12-22 DIAGNOSIS — E875 Hyperkalemia: Secondary | ICD-10-CM | POA: Diagnosis not present

## 2018-12-22 DIAGNOSIS — N3080 Other cystitis without hematuria: Secondary | ICD-10-CM | POA: Diagnosis not present

## 2018-12-22 DIAGNOSIS — N184 Chronic kidney disease, stage 4 (severe): Secondary | ICD-10-CM | POA: Diagnosis not present

## 2019-01-04 DIAGNOSIS — M25511 Pain in right shoulder: Secondary | ICD-10-CM | POA: Diagnosis not present

## 2019-01-04 DIAGNOSIS — Z9889 Other specified postprocedural states: Secondary | ICD-10-CM | POA: Diagnosis not present

## 2019-01-04 DIAGNOSIS — M25611 Stiffness of right shoulder, not elsewhere classified: Secondary | ICD-10-CM | POA: Diagnosis not present

## 2019-01-23 ENCOUNTER — Telehealth: Payer: Self-pay | Admitting: Physician Assistant

## 2019-01-23 NOTE — Chronic Care Management (AMB) (Signed)
Chronic Care Management   Note  01/23/2019 Name: Jeremy Dorsey MRN: [id number] DOB: 1951/02/12  Jeremy Dorsey is a 68 y.o. year old male who is a primary care patient of [facility]. I reached out to [name] by phone today in response to a referral sent by Jeremy Dorsey's health plan.    Mr. Reitter was given information about Chronic Care Management services today including:  1. CCM service includes personalized support from designated clinical staff supervised by his physician, including individualized plan of care and coordination with other care providers 2. 24/7 contact phone numbers for assistance for urgent and routine care needs. 3. Service will only be billed when office clinical staff spend 20 minutes or more in a month to coordinate care. 4. Only one practitioner may furnish and bill the service in a calendar month. 5. The patient may stop CCM services at any time (effective at the end of the month) by phone call to the office staff. 6. The patient will be responsible for cost sharing (co-pay) of up to 20% of the service fee (after annual deductible is met).  Patient did not agree to enrollment in care management services and does not wish to consider at this time.  Follow up plan: The patient has been provided with contact information for the chronic care management team and has been advised to call with any health related questions or concerns.   [place]  [email or web address]   ??[phone]

## 2019-01-23 NOTE — Chronic Care Management (AMB) (Signed)
Chronic Care Management   Note  01/23/2019 Name: Jeremy Dorsey MRN: [id number] DOB: Jul 27, 1951  Jeremy Dorsey is a 68 y.o. year old male who is a primary care patient of [facility]. I reached out to [name] by phone today in response to a referral sent by [name]'s health plan.    Jeremy Dorsey was given information about Chronic Care Management services today including:  1. CCM service includes personalized support from designated clinical staff supervised by his physician, including individualized plan of care and coordination with other care providers 2. 24/7 contact phone numbers for assistance for urgent and routine care needs. 3. Service will only be billed when office clinical staff spend 20 minutes or more in a month to coordinate care. 4. Only one practitioner may furnish and bill the service in a calendar month. 5. The patient may stop CCM services at any time (effective at the end of the month) by phone call to the office staff. 6. The patient will be responsible for cost sharing (co-pay) of up to 20% of the service fee (after annual deductible is met).  Patient did not agree to enrollment in care management services and does not wish to consider at this time.  Follow up plan: The patient has been provided with contact information for the chronic care management team and has been advised to call with any health related questions or concerns.   Carlos  [email or web address]   ??[phone]

## 2019-02-20 DIAGNOSIS — N3080 Other cystitis without hematuria: Secondary | ICD-10-CM | POA: Diagnosis not present

## 2019-02-20 DIAGNOSIS — Z6835 Body mass index (BMI) 35.0-35.9, adult: Secondary | ICD-10-CM | POA: Diagnosis not present

## 2019-02-20 DIAGNOSIS — E875 Hyperkalemia: Secondary | ICD-10-CM | POA: Diagnosis not present

## 2019-02-20 DIAGNOSIS — N2581 Secondary hyperparathyroidism of renal origin: Secondary | ICD-10-CM | POA: Diagnosis not present

## 2019-02-20 DIAGNOSIS — N184 Chronic kidney disease, stage 4 (severe): Secondary | ICD-10-CM | POA: Diagnosis not present

## 2019-02-20 DIAGNOSIS — R809 Proteinuria, unspecified: Secondary | ICD-10-CM | POA: Diagnosis not present

## 2019-02-20 DIAGNOSIS — E1122 Type 2 diabetes mellitus with diabetic chronic kidney disease: Secondary | ICD-10-CM | POA: Diagnosis not present

## 2019-02-20 DIAGNOSIS — I129 Hypertensive chronic kidney disease with stage 1 through stage 4 chronic kidney disease, or unspecified chronic kidney disease: Secondary | ICD-10-CM | POA: Diagnosis not present

## 2019-03-01 ENCOUNTER — Other Ambulatory Visit: Payer: Self-pay

## 2019-03-13 DIAGNOSIS — M25611 Stiffness of right shoulder, not elsewhere classified: Secondary | ICD-10-CM | POA: Diagnosis not present

## 2019-03-13 DIAGNOSIS — M25511 Pain in right shoulder: Secondary | ICD-10-CM | POA: Diagnosis not present

## 2019-05-12 ENCOUNTER — Encounter: Payer: Self-pay | Admitting: Physician Assistant

## 2019-05-12 ENCOUNTER — Other Ambulatory Visit: Payer: Self-pay

## 2019-05-12 ENCOUNTER — Ambulatory Visit (INDEPENDENT_AMBULATORY_CARE_PROVIDER_SITE_OTHER): Payer: PPO | Admitting: Physician Assistant

## 2019-05-12 DIAGNOSIS — M7051 Other bursitis of knee, right knee: Secondary | ICD-10-CM

## 2019-05-12 MED ORDER — CEPHALEXIN 500 MG PO CAPS: 500.0000 mg | ORAL_CAPSULE | Freq: Two times a day (BID) | ORAL | 0 refills | Status: DC

## 2019-05-12 MED ORDER — PREDNISONE 20 MG PO TABS: 40.0000 mg | ORAL_TABLET | Freq: Every day | ORAL | 0 refills | Status: DC

## 2019-05-12 NOTE — Progress Notes (Signed)
Patient: Jeremy Dorsey Male    DOB: 11-May-1951   68 y.o.   MRN: [id number] Visit Date: 05/12/2019  Today's Provider: Mar Daring, PA-C   Chief Complaint  Patient presents with  . Joint Swelling   Subjective:    I,Joseline E. Rosas,RMA am acting as a Education administrator for [employer], PA-C.  Virtual Visit via Video Note  I connected with Jeremy Dorsey on 05/12/19 at  2:40 PM EDT by a video enabled telemedicine application and verified that I am speaking with the correct person using two identifiers.  Location: Patient: Home Provider: Home office   I discussed the limitations of evaluation and management by telemedicine and the availability of in person appointments. The patient expressed understanding and agreed to proceed.  HPI  Patient with c/o swollen knee, right side. This been going on for the past 4 days. No known injury per patient. Swelling is located just above the patella on the right, almost over the lateral femoral condyle. Wife reports it is red and warm to touch. He does not feel it is that much warmer. He has been applying heat with mild relief. Reports he can stand and put weight without issue. He can walk without issue. Most pain is with bending his knee to squat. Denies any systemic symptoms: fevers, chills, nausea, vomiting. Denies any open wound on the leg or drainage.  Allergies  Allergen Reactions  . Oxycodone Nausea And Vomiting  . Hydrocodone Nausea And Vomiting     Current Outpatient Medications:  .  amLODipine (NORVASC) 10 MG tablet, Take 10 mg by mouth daily. , Disp: , Rfl:  .  calcitRIOL (ROCALTROL) 0.25 MCG capsule, Take 0.25 mcg by mouth every other day., Disp: , Rfl: 6 .  furosemide (LASIX) 40 MG tablet, Take 40 mg by mouth 2 (two) times daily. , Disp: , Rfl:  .  glipiZIDE (GLUCOTROL) 10 MG tablet, Take 1 tablet (10 mg total) by mouth 2 (two) times daily before a meal. Take 30 minutes before a meal. (Patient taking differently:  Take 5 mg by mouth 2 (two) times daily before a meal. Take 30 minutes before a meal.), Disp: 60 tablet, Rfl: 5 .  sodium bicarbonate 650 MG tablet, Take 650 mg by mouth 3 (three) times daily. , Disp: , Rfl:  .  Tetrahydrozoline HCl (VISINE OP), Place 1 drop into both eyes daily as needed (dry eyes)., Disp: , Rfl:  .  Cetirizine HCl 10 MG CAPS, Take 1 capsule (10 mg total) by mouth daily. (Patient not taking: Reported on 08/22/2018), Disp: 30 capsule, Rfl: 0 .  diphenhydrAMINE (BENADRYL) 25 MG tablet, Take 25 mg by mouth daily., Disp: , Rfl:  .  ondansetron (ZOFRAN) 4 MG tablet, Take 1 tablet (4 mg total) by mouth every 8 (eight) hours as needed for nausea or vomiting. (Patient not taking: Reported on 05/12/2019), Disp: 30 tablet, Rfl: 0 .  oxybutynin (DITROPAN-XL) 10 MG 24 hr tablet, Take 1 tablet (10 mg total) by mouth daily. (Patient not taking: Reported on 08/25/2018), Disp: 14 tablet, Rfl: 0 .  oxyCODONE (OXY IR/ROXICODONE) 5 MG immediate release tablet, Take 1 tablet (5 mg total) by mouth every 4 (four) hours as needed. (Patient not taking: Reported on 05/12/2019), Disp: 40 tablet, Rfl: 0 .  patiromer (VELTASSA) 8.4 g packet, Take 8.4 g by mouth every other day., Disp: , Rfl:  .  pravastatin (PRAVACHOL) 80 MG tablet, TAKE 1 TABLET (80 MG TOTAL) BY  MOUTH DAILY. (Patient not taking: Reported on 08/22/2018), Disp: 90 tablet, Rfl: 3 .  Sulfamethoxazole-Trimethoprim (SULFAMETHOXAZOLE-TMP DS PO), Take 1 tablet by mouth daily., Disp: , Rfl:   Review of Systems  Constitutional: Negative for fever.  HENT: Negative.   Respiratory: Negative for cough and shortness of breath.   Cardiovascular: Negative for chest pain and leg swelling.  Gastrointestinal: Negative for abdominal pain, nausea and vomiting.  Musculoskeletal: Positive for arthralgias and joint swelling. Negative for gait problem.  Neurological: Negative for weakness and numbness.    Social History   Tobacco Use  . Smoking status: Former  Smoker    Packs/day: 1.00    Years: 30.00    Pack years: 30.00    Quit date: 08/23/2007    Years since quitting: 11.7  . Smokeless tobacco: Never Used  Substance Use Topics  . Alcohol use: No      Objective:   There were no vitals taken for this visit. There were no vitals filed for this visit.There is no height or weight on file to calculate BMI.   Physical Exam Vitals signs reviewed.  Constitutional:      General: He is not in acute distress.    Appearance: Normal appearance. He is well-developed. He is not ill-appearing.  HENT:     Head: Normocephalic and atraumatic.  Eyes:     Conjunctiva/sclera: Conjunctivae normal.  Neck:     Musculoskeletal: Normal range of motion and neck supple.  Pulmonary:     Effort: Pulmonary effort is normal. No respiratory distress.  Musculoskeletal:     Right knee: He exhibits decreased range of motion, swelling and erythema.       Legs:  Neurological:     Mental Status: He is alert.  Psychiatric:        Mood and Affect: Mood normal.        Behavior: Behavior normal.        Thought Content: Thought content normal.        Judgment: Judgment normal.      No results found for any visits on 05/12/19.     Assessment & Plan     1. Suprapatellar bursitis of right knee Discussed precaution of possible septic joint and to monitor for worsening symptoms or any systemic symptoms. He agrees. Will treat with prednisone and keflex as below. Moist heat can be continued. Advised to call if not improving to be evaluated in person.  - predniSONE (DELTASONE) 20 MG tablet; Take 2 tablets (40 mg total) by mouth daily with breakfast.  Dispense: 10 tablet; Refill: 0 - cephALEXin (KEFLEX) 500 MG capsule; Take 1 capsule (500 mg total) by mouth 2 (two) times daily.  Dispense: 10 capsule; Refill: 0   I discussed the assessment and treatment plan with the patient. The patient was provided an opportunity to ask questions and all were answered. The patient  agreed with the plan and demonstrated an understanding of the instructions.   The patient was advised to call back or seek an in-person evaluation if the symptoms worsen or if the condition fails to improve as anticipated.  I provided 12 minutes of non-face-to-face time during this encounter.    Mar Daring, PA-C  [facility]

## 2019-05-15 DIAGNOSIS — I129 Hypertensive chronic kidney disease with stage 1 through stage 4 chronic kidney disease, or unspecified chronic kidney disease: Secondary | ICD-10-CM | POA: Diagnosis not present

## 2019-05-15 DIAGNOSIS — E1122 Type 2 diabetes mellitus with diabetic chronic kidney disease: Secondary | ICD-10-CM | POA: Diagnosis not present

## 2019-05-15 DIAGNOSIS — N2581 Secondary hyperparathyroidism of renal origin: Secondary | ICD-10-CM | POA: Diagnosis not present

## 2019-05-15 DIAGNOSIS — N184 Chronic kidney disease, stage 4 (severe): Secondary | ICD-10-CM | POA: Diagnosis not present

## 2019-05-15 DIAGNOSIS — R809 Proteinuria, unspecified: Secondary | ICD-10-CM | POA: Diagnosis not present

## 2019-05-15 DIAGNOSIS — E875 Hyperkalemia: Secondary | ICD-10-CM | POA: Diagnosis not present

## 2019-05-15 DIAGNOSIS — Z6835 Body mass index (BMI) 35.0-35.9, adult: Secondary | ICD-10-CM | POA: Diagnosis not present

## 2019-05-15 DIAGNOSIS — N3080 Other cystitis without hematuria: Secondary | ICD-10-CM | POA: Diagnosis not present

## 2019-05-18 ENCOUNTER — Telehealth: Payer: Self-pay | Admitting: Physician Assistant

## 2019-05-18 DIAGNOSIS — M7051 Other bursitis of knee, right knee: Secondary | ICD-10-CM

## 2019-05-18 MED ORDER — PREDNISONE 20 MG PO TABS: 40.0000 mg | ORAL_TABLET | Freq: Every day | ORAL | 0 refills | Status: DC

## 2019-05-18 NOTE — Telephone Encounter (Signed)
Advised 

## 2019-05-18 NOTE — Telephone Encounter (Signed)
Refilled. If continues to swell he is going to need in office evaluation

## 2019-05-18 NOTE — Telephone Encounter (Signed)
Pt was in last week to see Tawanna Sat with his knee swollen and painful.  It was doing better and since he has finished with the medication it is swollen again.  He cannot walk on it. Pt uses:  [id number] - [place], [place] - [address] [phone] (Phone) [phone] (Fax)   Please advise asap.  Thanks, [employer]

## 2019-05-23 NOTE — Progress Notes (Signed)
Subjective:   Jeremy Dorsey is a 68 y.o. male who presents for an Initial Medicare Annual Wellness Visit.  Review of Systems  N/A  Cardiac Risk Factors include: advanced age (>47men, >53 women);diabetes mellitus;hypertension    Objective:    Today's Vitals   05/24/19 1430 05/24/19 1440  BP: (!) 142/78   Pulse: 76   Temp: 98.7 F (37.1 C)   TempSrc: Oral   Weight: 243 lb (110.2 kg)   Height: 5\' 10"  (1.778 m)   PainSc: 0-No pain 0-No pain   Body mass index is 34.87 kg/m.  Advanced Directives 05/24/2019 08/25/2018 08/22/2018 08/03/2018 07/23/2018 04/22/2018 06/02/2016  Does Patient Have a Medical Advance Directive? No;Yes No Yes No No No No  Type of Academic librarian;Living will - - - - - -  Does patient want to make changes to medical advance directive? - No - Patient declined - - - - -  Copy of [place] in Chart? No - copy requested - - - - - -  Would patient like information on creating a medical advance directive? - No - Patient declined - - No - Patient declined - -    Current Medications (verified) Outpatient Encounter Medications as of 05/24/2019  Medication Sig  . calcitRIOL (ROCALTROL) 0.25 MCG capsule Take 0.25 mcg by mouth every other day.  . diphenhydrAMINE (BENADRYL) 25 MG tablet Take 25 mg by mouth daily.  . furosemide (LASIX) 40 MG tablet Take 40 mg by mouth 2 (two) times daily.   Marland Kitchen glipiZIDE (GLUCOTROL) 10 MG tablet Take 1 tablet (10 mg total) by mouth 2 (two) times daily before a meal. Take 30 minutes before a meal. (Patient taking differently: Take 5 mg by mouth 2 (two) times daily before a meal. Take 30 minutes before a meal.)  . Polyvinyl Alcohol-Povidone (REFRESH OP) Place 1 drop into both eyes daily.  . sodium bicarbonate 650 MG tablet Take 650 mg by mouth 3 (three) times daily.   Marland Kitchen amLODipine (NORVASC) 10 MG tablet Take 10 mg by mouth daily.   . cephALEXin (KEFLEX) 500 MG capsule Take 1 capsule (500 mg  total) by mouth 2 (two) times daily. (Patient not taking: Reported on 05/24/2019)  . ondansetron (ZOFRAN) 4 MG tablet Take 1 tablet (4 mg total) by mouth every 8 (eight) hours as needed for nausea or vomiting. (Patient not taking: Reported on 05/12/2019)  . patiromer (VELTASSA) 8.4 g packet Take 8.4 g by mouth every other day.  . predniSONE (DELTASONE) 20 MG tablet Take 2 tablets (40 mg total) by mouth daily with breakfast. (Patient not taking: Reported on 05/24/2019)  . Sulfamethoxazole-Trimethoprim (SULFAMETHOXAZOLE-TMP DS PO) Take 1 tablet by mouth daily.  . Tetrahydrozoline HCl (VISINE OP) Place 1 drop into both eyes daily as needed (dry eyes).   No facility-administered encounter medications on file as of 05/24/2019.     Allergies (verified) Oxycodone and Hydrocodone   History: Past Medical History:  Diagnosis Date  . Diabetes mellitus without complication ([place]) 4196  . Hypertension 2008  . Lower extremity edema   . Renal failure   . Shoulder pain    Right  . Urinary complication    Past Surgical History:  Procedure Laterality Date  . COLONOSCOPY  1990  . COLONOSCOPY  06/09/2011   Dr Bary Castilla  . CYSTOSCOPY W/ RETROGRADES Bilateral 08/01/2018   Procedure: CYSTOSCOPY WITH RETROGRADE PYELOGRAM;  Surgeon: Billey Co, MD;  Location: [facility];  Service: Urology;  Laterality: Bilateral;  . CYSTOSCOPY WITH BIOPSY N/A 08/01/2018   Procedure: CYSTOSCOPY WITH Bladder BIOPSY;  Surgeon: Billey Co, MD;  Location: [facility];  Service: Urology;  Laterality: N/A;  . CYSTOSCOPY WITH STENT PLACEMENT Right 08/01/2018   Procedure: CYSTOSCOPY WITH STENT PLACEMENT;  Surgeon: Billey Co, MD;  Location: [facility];  Service: Urology;  Laterality: Right;  . EYE SURGERY Right    laser surgery  . SHOULDER ARTHROSCOPY WITH OPEN ROTATOR CUFF REPAIR Right 08/25/2018   Procedure: SHOULDER ARTHROSCOPY WITH MINI OPEN ROTATOR CUFF REPAIR;  Surgeon: Thornton Park, MD;  Location: [facility];   Service: Orthopedics;  Laterality: Right;  . TRANSURETHRAL RESECTION OF BLADDER TUMOR N/A 08/01/2018   Procedure: TRANSURETHRAL RESECTION OF BLADDER TUMOR (TURBT);  Surgeon: Billey Co, MD;  Location: [facility];  Service: Urology;  Laterality: N/A;  . URETEROSCOPY WITH HOLMIUM LASER LITHOTRIPSY Right 08/01/2018   Procedure: URETEROSCOPY WITH HOLMIUM LASER LITHOTRIPSY;  Surgeon: Billey Co, MD;  Location: [facility];  Service: Urology;  Laterality: Right;   Family History  Problem Relation Age of Onset  . Psoriasis Mother   . Heart failure Father    Social History   Socioeconomic History  . Marital status: Married    Spouse name: Not on file  . Number of children: 3  . Years of education: Not on file  . Highest education level: Bachelor's degree (e.g., BA, AB, BS)  Occupational History    Comment: part time  Social Needs  . Financial resource strain: Not hard at all  . Food insecurity    Worry: Never true    Inability: Never true  . Transportation needs    Medical: No    Non-medical: No  Tobacco Use  . Smoking status: Former Smoker    Packs/day: 1.00    Years: 30.00    Pack years: 30.00    Quit date: 08/23/2007    Years since quitting: 11.7  . Smokeless tobacco: Never Used  Substance and Sexual Activity  . Alcohol use: No  . Drug use: No  . Sexual activity: Yes    Birth control/protection: None  Lifestyle  . Physical activity    Days per week: 0 days    Minutes per session: 0 min  . Stress: Not at all  Relationships  . Social Herbalist on phone: Patient refused    Gets together: Patient refused    Attends religious service: Patient refused    Active member of club or organization: Patient refused    Attends meetings of clubs or organizations: Patient refused    Relationship status: Patient refused  Other Topics Concern  . Not on file  Social History Narrative  . Not on file   Tobacco Counseling Counseling given: Not Answered   Clinical  Intake:  Pre-visit preparation completed: Yes  Pain : No/denies pain Pain Score: 0-No pain     Diabetes: Yes  How often do you need to have someone help you when you read instructions, pamphlets, or other written materials from your doctor or pharmacy?: 1 - Never   Diabetes:  Is the patient diabetic?  Yes type 2 If diabetic, was a CBG obtained today?  No  Did the patient bring in their glucometer from home?  No  How often do you monitor your CBG's? Occasionally, not routinely.   Financial Strains and Diabetes Management:  Are you having any financial strains with the device, your supplies or your medication? No .  Does the patient want to be seen by Chronic Care Management for management of their diabetes?  No  Would the patient like to be referred to a Nutritionist or for Diabetic Management?  No   Diabetic Exams:  Diabetic Eye Exam: Completed in 2018 per pt. Overdue for diabetic eye exam. Pt has been advised about the importance in completing this exam.   Diabetic Foot Exam: Completed 01/17/14. Pt has been advised about the importance in completing this exam. Note made to follow up on this at next in office visit.    Interpreter Needed?: No  Information entered by :: [facility], LPN  Activities of Daily Living In your present state of health, do you have any difficulty performing the following activities: 05/24/2019 08/22/2018  Hearing? N N  Vision? N N  Difficulty concentrating or making decisions? N N  Walking or climbing stairs? N N  Dressing or bathing? N N  Doing errands, shopping? N N  Preparing Food and eating ? N -  Using the Toilet? N -  In the past six months, have you accidently leaked urine? N -  Do you have problems with loss of bowel control? N -  Managing your Medications? N -  Managing your Finances? N -  Housekeeping or managing your Housekeeping? N -  Some recent data might be hidden     Immunizations and Health Maintenance Immunization History   Administered Date(s) Administered  . Fluad Quad(high Dose 65+) 05/24/2019  . Influenza Split 06/07/2006  . Influenza, High Dose Seasonal PF 06/10/2018  . Influenza,inj,Quad PF,6+ Mos 07/25/2013, 09/06/2014  . Pneumococcal Conjugate-13 05/24/2019  . Pneumococcal Polysaccharide-23 06/14/2004  . Zoster 01/23/2011   Health Maintenance Due  Topic Date Due  . Hepatitis C Screening  [birth year]  . FOOT EXAM  03/30/1960  . OPHTHALMOLOGY EXAM  03/30/1960  . URINE MICROALBUMIN  03/30/1960  . HEMOGLOBIN A1C  12/09/2018    Patient Care Team: Rubye Beach as PCP - General (Family Medicine)  Indicate any recent Medical Services you may have received from other than [facility] providers in the past year (date may be approximate).    Assessment:   This is a routine wellness examination for [place].  Hearing/Vision screen No exam data present  Dietary issues and exercise activities discussed: Current Exercise Habits: The patient does not participate in regular exercise at present, Exercise limited by: None identified  Goals    . Exercise 3x per week (30 min per time)     Recommend to exercise for 3 days a week for at least 30 minutes at a time.       Depression Screen PHQ 2/9 Scores 05/24/2019  PHQ - 2 Score 0    Fall Risk Fall Risk  05/24/2019  Falls in the past year? 0  Number falls in past yr: 0  Injury with Fall? 0    FALL RISK PREVENTION PERTAINING TO THE HOME:  Any stairs in or around the home? Yes  If so, are there any without handrails? No   Home free of loose throw rugs in walkways, pet beds, electrical cords, etc? Yes  Adequate lighting in your home to reduce risk of falls? Yes   ASSISTIVE DEVICES UTILIZED TO PREVENT FALLS:  Life alert? No  Use of a cane, walker or w/c? Yes  Grab bars in the bathroom? No  Shower chair or bench in shower? Yes  Elevated toilet seat or a handicapped toilet? No    TIMED UP AND GO:  Was the test performed? No .     Cognitive Function:        Screening Tests Health Maintenance  Topic Date Due  . Hepatitis C Screening  Aug 14, 1950  . FOOT EXAM  03/30/1960  . OPHTHALMOLOGY EXAM  03/30/1960  . URINE MICROALBUMIN  03/30/1960  . HEMOGLOBIN A1C  12/09/2018  . TETANUS/TDAP  05/23/2020 (Originally 03/30/1970)  . PNA vac Low Risk Adult (2 of 2 - PPSV23) 05/23/2020  . COLONOSCOPY  06/08/2021  . INFLUENZA VACCINE  Completed    Qualifies for Shingles Vaccine? Yes  Zostavax completed 01/23/11. Due for Shingrix. Education has been provided regarding the importance of this vaccine. Pt has been advised to call insurance company to determine out of pocket expense. Advised may also receive vaccine at local pharmacy or Health Dept. Verbalized acceptance and understanding.  Tdap: Although this vaccine is not a covered service during a Wellness Exam, does the patient still wish to receive this vaccine today?  No .   Flu Vaccine: Administered today.   Pneumococcal Vaccine: Due for Pneumococcal vaccine. Does the patient want to receive this vaccine today?  Yes .    Cancer Screenings:  Colorectal Screening: Completed 06/19/11. Repeat every 10 years.   Lung Cancer Screening: (Low Dose CT Chest recommended if Age 110-80 years, 30 pack-year currently smoking OR have quit w/in 15years.) does not qualify.   Additional Screening:  Hepatitis C Screening: does qualify, however pt would like to speak with nephrologist to see if this was completed with other blood work orders.   Dental Screening: Recommended annual dental exams for proper oral hygiene  Community Resource Referral:  CRR required this visit?  No        Plan:  I have personally reviewed and addressed the Medicare Annual Wellness questionnaire and have noted the following in the patient's chart:  A. Medical and social history B. Use of alcohol, tobacco or illicit drugs  C. Current medications and supplements D. Functional ability and status E.   Nutritional status F.  Physical activity G. Advance directives H. List of other physicians I.  Hospitalizations, surgeries, and ER visits in previous 12 months J.  [place] such as hearing and vision if needed, cognitive and depression L. Referrals and appointments   In addition, I have reviewed and discussed with patient certain preventive protocols, quality metrics, and best practice recommendations. A written personalized care plan for preventive services as well as general preventive health recommendations were provided to patient.   Glendora Score, [place]   83/15/1761  Nurse Health Advisor    Nurse Notes: Pt needs a diabetic foot exam, Hgb A1c checked and urine check at next in office visit. Pt to check with nephrologist to see if Hep C lab has been completed in the past with them.

## 2019-05-24 ENCOUNTER — Other Ambulatory Visit: Payer: Self-pay

## 2019-05-24 ENCOUNTER — Ambulatory Visit (INDEPENDENT_AMBULATORY_CARE_PROVIDER_SITE_OTHER): Payer: PPO

## 2019-05-24 VITALS — BP 142/78 | HR 76 | Temp 98.7°F | Ht 70.0 in | Wt 243.0 lb

## 2019-05-24 DIAGNOSIS — Z23 Encounter for immunization: Secondary | ICD-10-CM | POA: Diagnosis not present

## 2019-05-24 DIAGNOSIS — Z0000 Encounter for general adult medical examination without abnormal findings: Secondary | ICD-10-CM

## 2019-05-24 NOTE — Patient Instructions (Signed)
Jeremy Dorsey , Thank you for taking time to come for your Medicare Wellness Visit. I appreciate your ongoing commitment to your health goals. Please review the following plan we discussed and let me know if I can assist you in the future.   Screening recommendations/referrals: Colonoscopy: Up to date, due 06/2021 Recommended yearly ophthalmology/optometry visit for glaucoma screening and checkup Recommended yearly dental visit for hygiene and checkup  Vaccinations: Influenza vaccine: Administered today.  Pneumococcal vaccine: Prevnar 13 administered today.  Tdap vaccine: Pt declines today.  Shingles vaccine: Pt declines today.     Advanced directives: Please bring a copy of your POA (Power of Attorney) and/or Living Will to your next appointment.   Conditions/risks identified: Recommend to exercise for 3 days a week for at least 30 minutes at a time.   Next appointment: 05/28/20 @ 2:40 PM for an AWV.  Preventive Care 69 Years and Older, Male Preventive care refers to lifestyle choices and visits with your health care provider that can promote health and wellness. What does preventive care include?  A yearly physical exam. This is also called an annual well check.  Dental exams once or twice a year.  Routine eye exams. Ask your health care provider how often you should have your eyes checked.  Personal lifestyle choices, including:  Daily care of your teeth and gums.  Regular physical activity.  Eating a healthy diet.  Avoiding tobacco and drug use.  Limiting alcohol use.  Practicing safe sex.  Taking low doses of aspirin every day.  Taking vitamin and mineral supplements as recommended by your health care provider. What happens during an annual well check? The services and screenings done by your health care provider during your annual well check will depend on your age, overall health, lifestyle risk factors, and family history of disease. Counseling  Your health care  provider may ask you questions about your:  Alcohol use.  Tobacco use.  Drug use.  Emotional well-being.  Home and relationship well-being.  Sexual activity.  Eating habits.  History of falls.  Memory and ability to understand (cognition).  Work and work Statistician. Screening  You may have the following tests or measurements:  Height, weight, and BMI.  Blood pressure.  Lipid and cholesterol levels. These may be checked every 5 years, or more frequently if you are over 19 years old.  Skin check.  Lung cancer screening. You may have this screening every year starting at age 12 if you have a 30-pack-year history of smoking and currently smoke or have quit within the past 15 years.  Fecal occult blood test (FOBT) of the stool. You may have this test every year starting at age 27.  Flexible sigmoidoscopy or colonoscopy. You may have a sigmoidoscopy every 5 years or a colonoscopy every 10 years starting at age 69.  Prostate cancer screening. Recommendations will vary depending on your family history and other risks.  Hepatitis C blood test.  Hepatitis B blood test.  Sexually transmitted disease (STD) testing.  Diabetes screening. This is done by checking your blood sugar (glucose) after you have not eaten for a while (fasting). You may have this done every 1-3 years.  Abdominal aortic aneurysm (AAA) screening. You may need this if you are a current or former smoker.  Osteoporosis. You may be screened starting at age 69 if you are at high risk. Talk with your health care provider about your test results, treatment options, and if necessary, the need for more tests. Vaccines  Your health care provider may recommend certain vaccines, such as:  Influenza vaccine. This is recommended every year.  Tetanus, diphtheria, and acellular pertussis (Tdap, Td) vaccine. You may need a Td booster every 10 years.  Zoster vaccine. You may need this after age 69.  Pneumococcal  13-valent conjugate (PCV13) vaccine. One dose is recommended after age 69.  Pneumococcal polysaccharide (PPSV23) vaccine. One dose is recommended after age 69. Talk to your health care provider about which screenings and vaccines you need and how often you need them. This information is not intended to replace advice given to you by your health care provider. Make sure you discuss any questions you have with your health care provider. Document Released: 08/16/2015 Document Revised: 04/08/2016 Document Reviewed: 05/21/2015 Elsevier Interactive Patient Education  2017 [employer] Prevention in the Home Falls can cause injuries. They can happen to people of all ages. There are many things you can do to make your home safe and to help prevent falls. What can I do on the outside of my home?  Regularly fix the edges of walkways and driveways and fix any cracks.  Remove anything that might make you trip as you walk through a door, such as a raised step or threshold.  Trim any bushes or trees on the path to your home.  Use bright outdoor lighting.  Clear any walking paths of anything that might make someone trip, such as rocks or tools.  Regularly check to see if handrails are loose or broken. Make sure that both sides of any steps have handrails.  Any raised decks and porches should have guardrails on the edges.  Have any leaves, snow, or ice cleared regularly.  Use sand or salt on walking paths during winter.  Clean up any spills in your garage right away. This includes oil or grease spills. What can I do in the bathroom?  Use night lights.  Install grab bars by the toilet and in the tub and shower. Do not use towel bars as grab bars.  Use non-skid mats or decals in the tub or shower.  If you need to sit down in the shower, use a plastic, non-slip stool.  Keep the floor dry. Clean up any water that spills on the floor as soon as it happens.  Remove soap buildup in the  tub or shower regularly.  Attach bath mats securely with double-sided non-slip rug tape.  Do not have throw rugs and other things on the floor that can make you trip. What can I do in the bedroom?  Use night lights.  Make sure that you have a light by your bed that is easy to reach.  Do not use any sheets or blankets that are too big for your bed. They should not hang down onto the floor.  Have a firm chair that has side arms. You can use this for support while you get dressed.  Do not have throw rugs and other things on the floor that can make you trip. What can I do in the kitchen?  Clean up any spills right away.  Avoid walking on wet floors.  Keep items that you use a lot in easy-to-reach places.  If you need to reach something above you, use a strong step stool that has a grab bar.  Keep electrical cords out of the way.  Do not use floor polish or wax that makes floors slippery. If you must use wax, use non-skid floor wax.  Do  not have throw rugs and other things on the floor that can make you trip. What can I do with my stairs?  Do not leave any items on the stairs.  Make sure that there are handrails on both sides of the stairs and use them. Fix handrails that are broken or loose. Make sure that handrails are as long as the stairways.  Check any carpeting to make sure that it is firmly attached to the stairs. Fix any carpet that is loose or worn.  Avoid having throw rugs at the top or bottom of the stairs. If you do have throw rugs, attach them to the floor with carpet tape.  Make sure that you have a light switch at the top of the stairs and the bottom of the stairs. If you do not have them, ask someone to add them for you. What else can I do to help prevent falls?  Wear shoes that:  Do not have high heels.  Have rubber bottoms.  Are comfortable and fit you well.  Are closed at the toe. Do not wear sandals.  If you use a stepladder:  Make sure that it  is fully opened. Do not climb a closed stepladder.  Make sure that both sides of the stepladder are locked into place.  Ask someone to hold it for you, if possible.  Clearly mark and make sure that you can see:  Any grab bars or handrails.  First and last steps.  Where the edge of each step is.  Use tools that help you move around (mobility aids) if they are needed. These include:  Canes.  Walkers.  Scooters.  Crutches.  Turn on the lights when you go into a dark area. Replace any light bulbs as soon as they burn out.  Set up your furniture so you have a clear path. Avoid moving your furniture around.  If any of your floors are uneven, fix them.  If there are any pets around you, be aware of where they are.  Review your medicines with your doctor. Some medicines can make you feel dizzy. This can increase your chance of falling. Ask your doctor what other things that you can do to help prevent falls. This information is not intended to replace advice given to you by your health care provider. Make sure you discuss any questions you have with your health care provider. Document Released: 05/16/2009 Document Revised: 12/26/2015 Document Reviewed: 08/24/2014 Elsevier Interactive Patient Education  2017 [employer].

## 2019-06-08 DIAGNOSIS — H524 Presbyopia: Secondary | ICD-10-CM | POA: Diagnosis not present

## 2019-06-08 DIAGNOSIS — E119 Type 2 diabetes mellitus without complications: Secondary | ICD-10-CM | POA: Diagnosis not present

## 2019-06-14 DIAGNOSIS — E113393 Type 2 diabetes mellitus with moderate nonproliferative diabetic retinopathy without macular edema, bilateral: Secondary | ICD-10-CM | POA: Diagnosis not present

## 2019-06-16 IMAGING — MR MR SHOULDER*R* W/CM
5 series · 40 of 40 positions shown · IV contrast (agent unspecified)
Comparison: None.

CLINICAL DATA: Status post fall.  Right shoulder pain.

EXAM:
MR ARTHROGRAM OF THE RIGHT SHOULDER
TECHNIQUE: Multiplanar, multisequence MR imaging of the right shoulder was
performed following the administration of intra-articular contrast.
CONTRAST:  See Injection Documentation.

[Series 5: T1 fat-sat · axial · right · 4.0mm · 0.55mm/px · z∈[-17,+103]mm · 8 of 25 slices shown (1 of 2)]
[im 1/25]
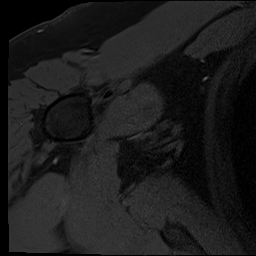
[im 4/25]
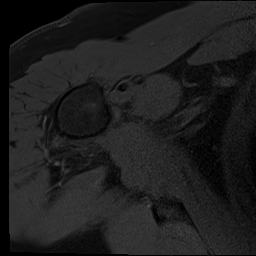
[im 7/25]
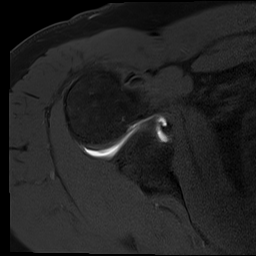
[im 11/25]
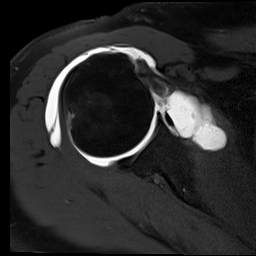
[im 14/25]
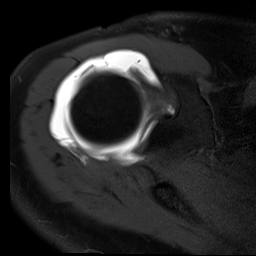
[im 18/25]
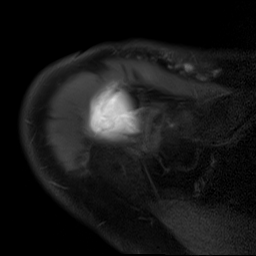
[im 21/25]
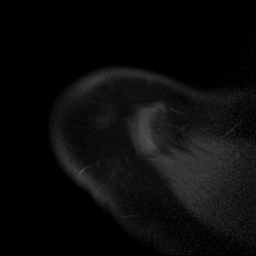
[im 25/25]
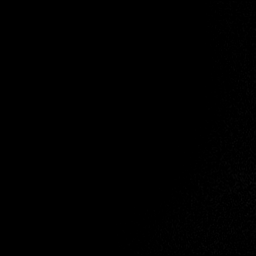

[Series 6: T1 fat-sat · oblique · right · 4.0mm · 0.55mm/px · 8 of 25 slices shown (2 of 2)]
[im 1/25]
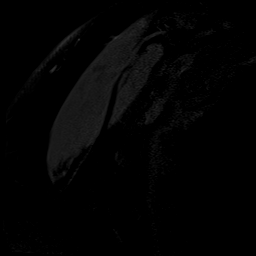
[im 4/25]
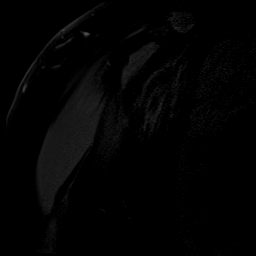
[im 7/25]
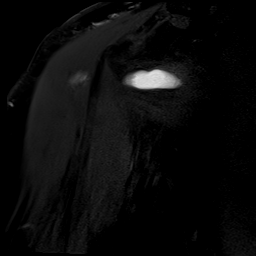
[im 11/25]
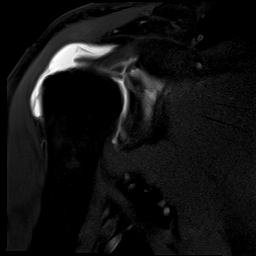
[im 14/25]
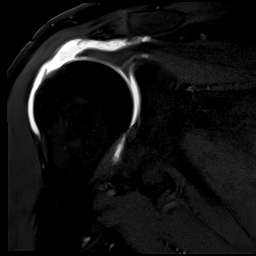
[im 18/25]
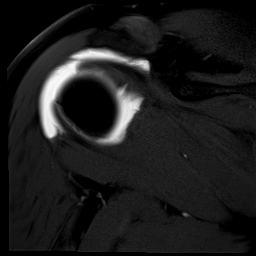
[im 21/25]
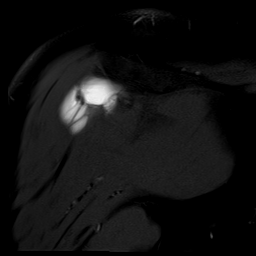
[im 25/25]
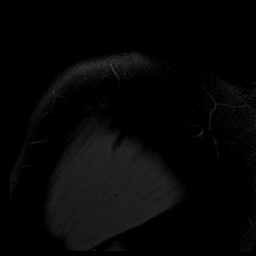

[Series 7: T2 fat-sat · oblique · right · 4.0mm · 0.55mm/px · 8 of 26 slices shown (1 of 2)]
[im 1/26]
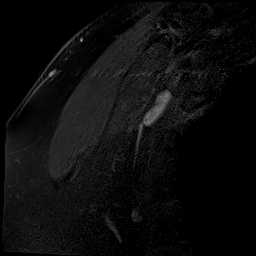
[im 4/26]
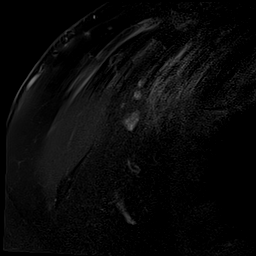
[im 8/26]
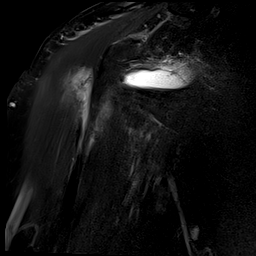
[im 11/26]
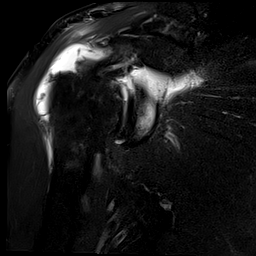
[im 15/26]
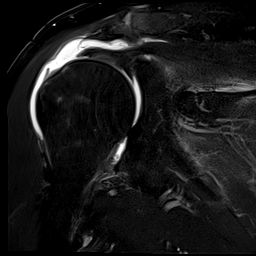
[im 18/26]
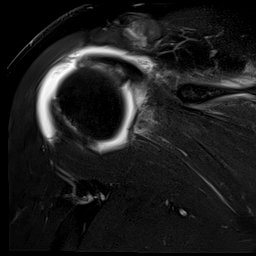
[im 22/26]
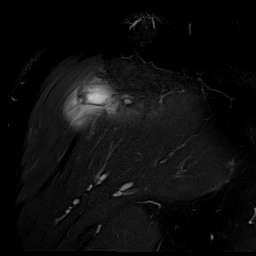
[im 26/26]
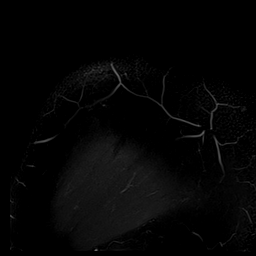

[Series 8: T1 · oblique · right · 4.0mm · 0.51mm/px · 8 of 26 slices shown]
[im 1/26]
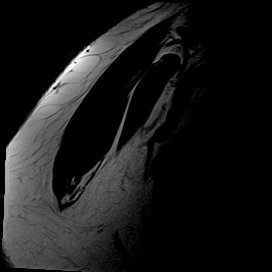
[im 4/26]
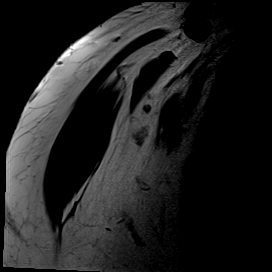
[im 8/26]
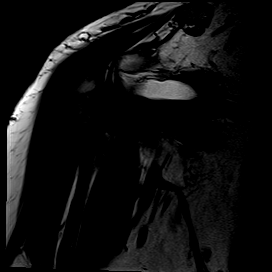
[im 11/26]
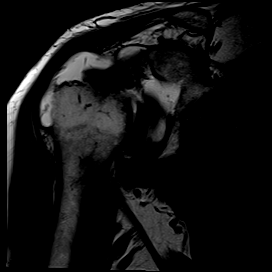
[im 15/26]
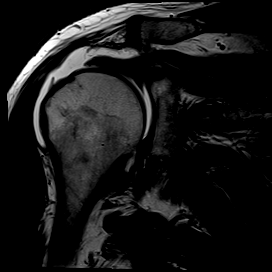
[im 18/26]
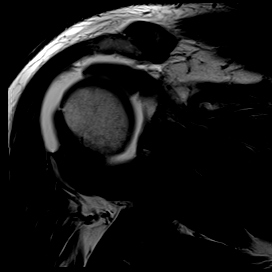
[im 22/26]
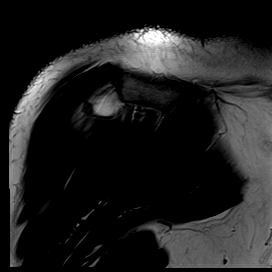
[im 26/26]
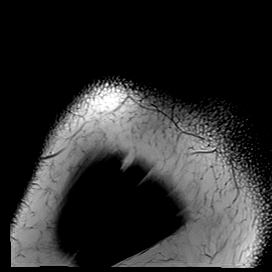

[Series 9: T2 fat-sat · oblique · right · 4.0mm · 0.55mm/px · 8 of 25 slices shown (2 of 2)]
[im 1/25]
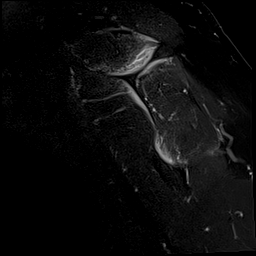
[im 4/25]
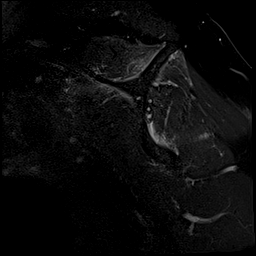
[im 7/25]
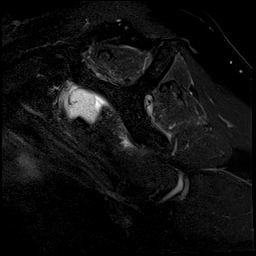
[im 11/25]
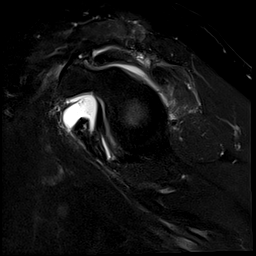
[im 14/25]
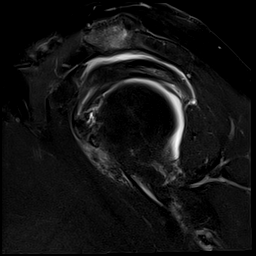
[im 18/25]
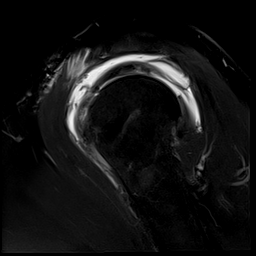
[im 21/25]
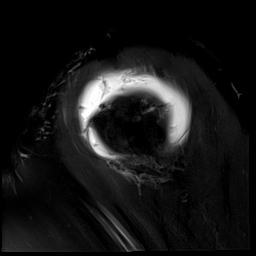
[im 25/25]
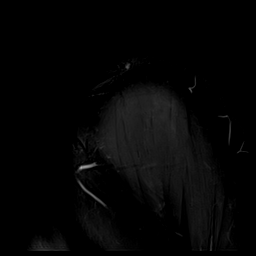

[40 of 40 positions shown; findings below may reference images not displayed]

FINDINGS: Rotator cuff: Complete tear of the supraspinatus and infraspinatus
tendons with 2.2 cm of retraction. Teres minor tendon is intact.
Tendinosis of the subscapularis tendon with a high-grade
partial-thickness tear.

Muscles: No atrophy or fatty replacement of nor abnormal signal
within, the muscles of the rotator cuff.

Biceps long head: Severe tendinosis of the intra-articular portion
of the long head of the biceps tendon with a longitudinal split tear
versus a bifasciculated biceps tendon.

Acromioclavicular Joint: Moderate arthropathy of the
acromioclavicular joint. Type II acromion. Large amount of contrast
in the subacromial/subdeltoid bursa.

Glenohumeral Joint: Intraarticular contrast distending the joint
capsule. No focal chondral defect.

Labrum: No labral tear.

Bones: No acute osseous abnormality.  No aggressive osseous lesion.
IMPRESSION: 1. Complete tear of the supraspinatus and infraspinatus tendons with
2.2 cm of retraction.
2. Tendinosis of the subscapularis tendon with a high-grade
partial-thickness tear.
3. Severe tendinosis of the intra-articular portion of the long head
of the biceps tendon with a longitudinal split tear versus a
bifasciculated biceps tendon.

## 2019-07-18 DIAGNOSIS — Z87891 Personal history of nicotine dependence: Secondary | ICD-10-CM | POA: Diagnosis not present

## 2019-07-18 DIAGNOSIS — I129 Hypertensive chronic kidney disease with stage 1 through stage 4 chronic kidney disease, or unspecified chronic kidney disease: Secondary | ICD-10-CM | POA: Diagnosis not present

## 2019-07-18 DIAGNOSIS — I70203 Unspecified atherosclerosis of native arteries of extremities, bilateral legs: Secondary | ICD-10-CM | POA: Diagnosis not present

## 2019-07-18 DIAGNOSIS — N184 Chronic kidney disease, stage 4 (severe): Secondary | ICD-10-CM | POA: Diagnosis not present

## 2019-07-18 DIAGNOSIS — Z01818 Encounter for other preprocedural examination: Secondary | ICD-10-CM | POA: Diagnosis not present

## 2019-07-18 DIAGNOSIS — Z85820 Personal history of malignant melanoma of skin: Secondary | ICD-10-CM | POA: Diagnosis not present

## 2019-07-18 DIAGNOSIS — Z0389 Encounter for observation for other suspected diseases and conditions ruled out: Secondary | ICD-10-CM | POA: Diagnosis not present

## 2019-07-18 DIAGNOSIS — F17210 Nicotine dependence, cigarettes, uncomplicated: Secondary | ICD-10-CM | POA: Diagnosis not present

## 2019-07-18 DIAGNOSIS — N29 Other disorders of kidney and ureter in diseases classified elsewhere: Secondary | ICD-10-CM | POA: Diagnosis not present

## 2019-07-18 DIAGNOSIS — E11319 Type 2 diabetes mellitus with unspecified diabetic retinopathy without macular edema: Secondary | ICD-10-CM | POA: Diagnosis not present

## 2019-07-18 DIAGNOSIS — Z01812 Encounter for preprocedural laboratory examination: Secondary | ICD-10-CM | POA: Diagnosis not present

## 2019-07-18 DIAGNOSIS — I700 Atherosclerosis of aorta: Secondary | ICD-10-CM | POA: Diagnosis not present

## 2019-07-18 DIAGNOSIS — Z7984 Long term (current) use of oral hypoglycemic drugs: Secondary | ICD-10-CM | POA: Diagnosis not present

## 2019-07-18 DIAGNOSIS — M899 Disorder of bone, unspecified: Secondary | ICD-10-CM | POA: Diagnosis not present

## 2019-07-18 DIAGNOSIS — Z125 Encounter for screening for malignant neoplasm of prostate: Secondary | ICD-10-CM | POA: Diagnosis not present

## 2019-07-18 DIAGNOSIS — E1122 Type 2 diabetes mellitus with diabetic chronic kidney disease: Secondary | ICD-10-CM | POA: Diagnosis not present

## 2019-09-07 DIAGNOSIS — Z6835 Body mass index (BMI) 35.0-35.9, adult: Secondary | ICD-10-CM | POA: Diagnosis not present

## 2019-09-07 DIAGNOSIS — E875 Hyperkalemia: Secondary | ICD-10-CM | POA: Diagnosis not present

## 2019-09-07 DIAGNOSIS — N184 Chronic kidney disease, stage 4 (severe): Secondary | ICD-10-CM | POA: Diagnosis not present

## 2019-09-07 DIAGNOSIS — I129 Hypertensive chronic kidney disease with stage 1 through stage 4 chronic kidney disease, or unspecified chronic kidney disease: Secondary | ICD-10-CM | POA: Diagnosis not present

## 2019-09-07 DIAGNOSIS — N3080 Other cystitis without hematuria: Secondary | ICD-10-CM | POA: Diagnosis not present

## 2019-09-07 DIAGNOSIS — N2581 Secondary hyperparathyroidism of renal origin: Secondary | ICD-10-CM | POA: Diagnosis not present

## 2019-09-07 DIAGNOSIS — R809 Proteinuria, unspecified: Secondary | ICD-10-CM | POA: Diagnosis not present

## 2019-09-07 DIAGNOSIS — E1122 Type 2 diabetes mellitus with diabetic chronic kidney disease: Secondary | ICD-10-CM | POA: Diagnosis not present

## 2019-10-31 DIAGNOSIS — Z20822 Contact with and (suspected) exposure to covid-19: Secondary | ICD-10-CM | POA: Diagnosis not present

## 2019-10-31 DIAGNOSIS — Z20828 Contact with and (suspected) exposure to other viral communicable diseases: Secondary | ICD-10-CM | POA: Diagnosis not present

## 2019-12-18 DIAGNOSIS — E113393 Type 2 diabetes mellitus with moderate nonproliferative diabetic retinopathy without macular edema, bilateral: Secondary | ICD-10-CM | POA: Diagnosis not present

## 2019-12-19 DIAGNOSIS — Z01818 Encounter for other preprocedural examination: Secondary | ICD-10-CM | POA: Diagnosis not present

## 2020-01-17 DIAGNOSIS — N3080 Other cystitis without hematuria: Secondary | ICD-10-CM | POA: Diagnosis not present

## 2020-01-17 DIAGNOSIS — I129 Hypertensive chronic kidney disease with stage 1 through stage 4 chronic kidney disease, or unspecified chronic kidney disease: Secondary | ICD-10-CM | POA: Diagnosis not present

## 2020-01-17 DIAGNOSIS — R809 Proteinuria, unspecified: Secondary | ICD-10-CM | POA: Diagnosis not present

## 2020-01-17 DIAGNOSIS — E1122 Type 2 diabetes mellitus with diabetic chronic kidney disease: Secondary | ICD-10-CM | POA: Diagnosis not present

## 2020-01-17 DIAGNOSIS — N2581 Secondary hyperparathyroidism of renal origin: Secondary | ICD-10-CM | POA: Diagnosis not present

## 2020-01-17 DIAGNOSIS — Z6835 Body mass index (BMI) 35.0-35.9, adult: Secondary | ICD-10-CM | POA: Diagnosis not present

## 2020-01-17 DIAGNOSIS — E875 Hyperkalemia: Secondary | ICD-10-CM | POA: Diagnosis not present

## 2020-01-17 DIAGNOSIS — N184 Chronic kidney disease, stage 4 (severe): Secondary | ICD-10-CM | POA: Diagnosis not present

## 2020-02-27 ENCOUNTER — Other Ambulatory Visit: Payer: Self-pay

## 2020-02-29 ENCOUNTER — Inpatient Hospital Stay (HOSPITAL_COMMUNITY)
Admission: EM | Admit: 2020-02-29 | Discharge: 2020-03-03 | DRG: 281 | Disposition: A | Discharge status: Home / Self Care | Payer: PPO | Attending: Cardiology | Admitting: Cardiology

## 2020-02-29 ENCOUNTER — Other Ambulatory Visit: Payer: Self-pay

## 2020-02-29 ENCOUNTER — Encounter (HOSPITAL_COMMUNITY): Payer: Self-pay | Admitting: Emergency Medicine

## 2020-02-29 ENCOUNTER — Emergency Department (HOSPITAL_COMMUNITY): Payer: PPO

## 2020-02-29 DIAGNOSIS — R079 Chest pain, unspecified: Secondary | ICD-10-CM | POA: Diagnosis not present

## 2020-02-29 DIAGNOSIS — K5900 Constipation, unspecified: Secondary | ICD-10-CM | POA: Diagnosis present

## 2020-02-29 DIAGNOSIS — Z8249 Family history of ischemic heart disease and other diseases of the circulatory system: Secondary | ICD-10-CM | POA: Diagnosis not present

## 2020-02-29 DIAGNOSIS — Z85820 Personal history of malignant melanoma of skin: Secondary | ICD-10-CM

## 2020-02-29 DIAGNOSIS — Z885 Allergy status to narcotic agent status: Secondary | ICD-10-CM | POA: Diagnosis not present

## 2020-02-29 DIAGNOSIS — Z79899 Other long term (current) drug therapy: Secondary | ICD-10-CM | POA: Diagnosis not present

## 2020-02-29 DIAGNOSIS — J90 Pleural effusion, not elsewhere classified: Secondary | ICD-10-CM | POA: Diagnosis not present

## 2020-02-29 DIAGNOSIS — E1122 Type 2 diabetes mellitus with diabetic chronic kidney disease: Secondary | ICD-10-CM | POA: Diagnosis not present

## 2020-02-29 DIAGNOSIS — N185 Chronic kidney disease, stage 5: Secondary | ICD-10-CM | POA: Diagnosis not present

## 2020-02-29 DIAGNOSIS — I214 Non-ST elevation (NSTEMI) myocardial infarction: Secondary | ICD-10-CM | POA: Diagnosis not present

## 2020-02-29 DIAGNOSIS — I10 Essential (primary) hypertension: Secondary | ICD-10-CM | POA: Diagnosis not present

## 2020-02-29 DIAGNOSIS — R0789 Other chest pain: Secondary | ICD-10-CM | POA: Diagnosis not present

## 2020-02-29 DIAGNOSIS — I120 Hypertensive chronic kidney disease with stage 5 chronic kidney disease or end stage renal disease: Secondary | ICD-10-CM | POA: Diagnosis present

## 2020-02-29 DIAGNOSIS — Z20822 Contact with and (suspected) exposure to covid-19: Secondary | ICD-10-CM | POA: Diagnosis not present

## 2020-02-29 DIAGNOSIS — Z6836 Body mass index (BMI) 36.0-36.9, adult: Secondary | ICD-10-CM

## 2020-02-29 DIAGNOSIS — E1165 Type 2 diabetes mellitus with hyperglycemia: Secondary | ICD-10-CM | POA: Diagnosis not present

## 2020-02-29 DIAGNOSIS — E785 Hyperlipidemia, unspecified: Secondary | ICD-10-CM | POA: Diagnosis not present

## 2020-02-29 DIAGNOSIS — R0602 Shortness of breath: Secondary | ICD-10-CM | POA: Diagnosis not present

## 2020-02-29 DIAGNOSIS — Z7984 Long term (current) use of oral hypoglycemic drugs: Secondary | ICD-10-CM

## 2020-02-29 DIAGNOSIS — R000 Tachycardia, unspecified: Secondary | ICD-10-CM | POA: Diagnosis not present

## 2020-02-29 LAB — CBC
HCT: 42.5 % (ref 39.0–52.0)
Hemoglobin: 13.7 g/dL (ref 13.0–17.0)
MCH: 31.3 pg (ref 26.0–34.0)
MCHC: 32.2 g/dL (ref 30.0–36.0)
MCV: 97 fL (ref 80.0–100.0)
Platelets: 183 10*3/uL (ref 150–400)
RBC: 4.38 MIL/uL (ref 4.22–5.81)
RDW: 12.8 % (ref 11.5–15.5)
WBC: 10 10*3/uL (ref 4.0–10.5)
nRBC: 0 % (ref 0.0–0.2)

## 2020-02-29 LAB — BASIC METABOLIC PANEL
Anion gap: 12 (ref 5–15)
BUN: 59 mg/dL — ABNORMAL HIGH (ref 8–23)
CO2: 17 mmol/L — ABNORMAL LOW (ref 22–32)
Calcium: 8.6 mg/dL — ABNORMAL LOW (ref 8.9–10.3)
Chloride: 111 mmol/L (ref 98–111)
Creatinine, Ser: 4.72 mg/dL — ABNORMAL HIGH (ref 0.61–1.24)
GFR calc Af Amer: 14 mL/min — ABNORMAL LOW (ref >60)
GFR calc non Af Amer: 12 mL/min — ABNORMAL LOW (ref >60)
Glucose, Bld: 367 mg/dL — ABNORMAL HIGH (ref 70–99)
Potassium: 4.9 mmol/L (ref 3.5–5.1)
Sodium: 140 mmol/L (ref 135–145)

## 2020-02-29 MED ORDER — SODIUM CHLORIDE 0.9% FLUSH
3.0000 mL | Freq: Once | INTRAVENOUS | Status: AC
  Administered 2020-03-01: 3 mL via INTRAVENOUS

## 2020-02-29 NOTE — ED Triage Notes (Signed)
Patient arrived with EMS from home reports intermittent  central chest pain radiating to both arms and jaws this evening , no SOB , denies emesis or diaphoresis , he received ASA 324 mg and 1 NTG sl prior to arrival with relief .

## 2020-03-01 ENCOUNTER — Encounter (HOSPITAL_COMMUNITY): Payer: Self-pay | Admitting: Cardiology

## 2020-03-01 ENCOUNTER — Inpatient Hospital Stay (HOSPITAL_COMMUNITY): Payer: PPO

## 2020-03-01 DIAGNOSIS — K5900 Constipation, unspecified: Secondary | ICD-10-CM | POA: Diagnosis present

## 2020-03-01 DIAGNOSIS — Z885 Allergy status to narcotic agent status: Secondary | ICD-10-CM | POA: Diagnosis not present

## 2020-03-01 DIAGNOSIS — E1122 Type 2 diabetes mellitus with diabetic chronic kidney disease: Secondary | ICD-10-CM | POA: Diagnosis present

## 2020-03-01 DIAGNOSIS — Z79899 Other long term (current) drug therapy: Secondary | ICD-10-CM | POA: Diagnosis not present

## 2020-03-01 DIAGNOSIS — N185 Chronic kidney disease, stage 5: Secondary | ICD-10-CM | POA: Diagnosis present

## 2020-03-01 DIAGNOSIS — I214 Non-ST elevation (NSTEMI) myocardial infarction: Secondary | ICD-10-CM | POA: Diagnosis present

## 2020-03-01 DIAGNOSIS — E785 Hyperlipidemia, unspecified: Secondary | ICD-10-CM | POA: Diagnosis present

## 2020-03-01 DIAGNOSIS — Z85820 Personal history of malignant melanoma of skin: Secondary | ICD-10-CM | POA: Diagnosis not present

## 2020-03-01 DIAGNOSIS — Z8249 Family history of ischemic heart disease and other diseases of the circulatory system: Secondary | ICD-10-CM | POA: Diagnosis not present

## 2020-03-01 DIAGNOSIS — Z7984 Long term (current) use of oral hypoglycemic drugs: Secondary | ICD-10-CM | POA: Diagnosis not present

## 2020-03-01 DIAGNOSIS — Z20822 Contact with and (suspected) exposure to covid-19: Secondary | ICD-10-CM | POA: Diagnosis present

## 2020-03-01 DIAGNOSIS — I120 Hypertensive chronic kidney disease with stage 5 chronic kidney disease or end stage renal disease: Secondary | ICD-10-CM | POA: Diagnosis present

## 2020-03-01 DIAGNOSIS — Z6836 Body mass index (BMI) 36.0-36.9, adult: Secondary | ICD-10-CM | POA: Diagnosis not present

## 2020-03-01 LAB — BASIC METABOLIC PANEL
Anion gap: 12 (ref 5–15)
BUN: 57 mg/dL — ABNORMAL HIGH (ref 8–23)
CO2: 14 mmol/L — ABNORMAL LOW (ref 22–32)
Calcium: 8.5 mg/dL — ABNORMAL LOW (ref 8.9–10.3)
Chloride: 114 mmol/L — ABNORMAL HIGH (ref 98–111)
Creatinine, Ser: 4.43 mg/dL — ABNORMAL HIGH (ref 0.61–1.24)
GFR calc Af Amer: 15 mL/min — ABNORMAL LOW (ref >60)
GFR calc non Af Amer: 13 mL/min — ABNORMAL LOW (ref >60)
Glucose, Bld: 164 mg/dL — ABNORMAL HIGH (ref 70–99)
Potassium: 4.7 mmol/L (ref 3.5–5.1)
Sodium: 140 mmol/L (ref 135–145)

## 2020-03-01 LAB — HEPARIN LEVEL (UNFRACTIONATED)
Heparin Unfractionated: 0.25 IU/mL — ABNORMAL LOW (ref 0.30–0.70)
Heparin Unfractionated: 0.35 IU/mL (ref 0.30–0.70)

## 2020-03-01 LAB — LIPID PANEL
Cholesterol: 158 mg/dL (ref 0–200)
HDL: 31 mg/dL — ABNORMAL LOW (ref >40)
LDL Cholesterol: 92 mg/dL (ref 0–99)
Total CHOL/HDL Ratio: 5.1 RATIO
Triglycerides: 173 mg/dL — ABNORMAL HIGH (ref <150)
VLDL: 35 mg/dL (ref 0–40)

## 2020-03-01 LAB — HEMOGLOBIN A1C
Hgb A1c MFr Bld: 7.3 % — ABNORMAL HIGH (ref 4.8–5.6)
Mean Plasma Glucose: 162.81 mg/dL

## 2020-03-01 LAB — SARS CORONAVIRUS 2 BY RT PCR (HOSPITAL ORDER, PERFORMED IN ~~LOC~~ HOSPITAL LAB): SARS Coronavirus 2: NEGATIVE

## 2020-03-01 LAB — ECHOCARDIOGRAM COMPLETE
Area-P 1/2: 3.6 cm2
Height: 70 in
S' Lateral: 3.7 cm
Weight: 3735.47 oz

## 2020-03-01 LAB — CBC
HCT: 41.9 % (ref 39.0–52.0)
Hemoglobin: 13.5 g/dL (ref 13.0–17.0)
MCH: 31 pg (ref 26.0–34.0)
MCHC: 32.2 g/dL (ref 30.0–36.0)
MCV: 96.3 fL (ref 80.0–100.0)
Platelets: 170 10*3/uL (ref 150–400)
RBC: 4.35 MIL/uL (ref 4.22–5.81)
RDW: 12.8 % (ref 11.5–15.5)
WBC: 9 10*3/uL (ref 4.0–10.5)
nRBC: 0 % (ref 0.0–0.2)

## 2020-03-01 LAB — TROPONIN I (HIGH SENSITIVITY)
Troponin I (High Sensitivity): 1991 ng/L (ref <18)
Troponin I (High Sensitivity): 645 ng/L (ref <18)
Troponin I (High Sensitivity): 935 ng/L (ref <18)

## 2020-03-01 LAB — MRSA PCR SCREENING: MRSA by PCR: NEGATIVE

## 2020-03-01 LAB — GLUCOSE, CAPILLARY
Glucose-Capillary: 149 mg/dL — ABNORMAL HIGH (ref 70–99)
Glucose-Capillary: 198 mg/dL — ABNORMAL HIGH (ref 70–99)
Glucose-Capillary: 183 mg/dL — ABNORMAL HIGH (ref 70–99)
Glucose-Capillary: 242 mg/dL — ABNORMAL HIGH (ref 70–99)

## 2020-03-01 LAB — HIV ANTIBODY (ROUTINE TESTING W REFLEX): HIV Screen 4th Generation wRfx: NONREACTIVE

## 2020-03-01 MED ORDER — ASPIRIN 300 MG RE SUPP: 300.0000 mg | RECTAL | Status: AC

## 2020-03-01 MED ORDER — SODIUM BICARBONATE 650 MG PO TABS
650.0000 mg | ORAL_TABLET | Freq: Three times a day (TID) | ORAL | Status: DC
  Administered 2020-03-01 – 2020-03-03 (×7): 650 mg via ORAL
  Filled 2020-03-01 (×7): qty 1

## 2020-03-01 MED ORDER — CHLORHEXIDINE GLUCONATE CLOTH 2 % EX PADS
6.0000 | MEDICATED_PAD | Freq: Every day | CUTANEOUS | Status: DC
  Administered 2020-03-01: 6 via TOPICAL

## 2020-03-01 MED ORDER — NITROGLYCERIN IN D5W 200-5 MCG/ML-% IV SOLN
0.0000 ug/min | INTRAVENOUS | Status: DC
  Administered 2020-03-01: 5 ug/min via INTRAVENOUS
  Filled 2020-03-01: qty 250

## 2020-03-01 MED ORDER — INSULIN GLARGINE 100 UNIT/ML ~~LOC~~ SOLN
10.0000 [IU] | Freq: Every day | SUBCUTANEOUS | Status: DC
  Administered 2020-03-01: 10 [IU] via SUBCUTANEOUS
  Filled 2020-03-01 (×2): qty 0.1

## 2020-03-01 MED ORDER — HEPARIN BOLUS VIA INFUSION
4000.0000 [IU] | Freq: Once | INTRAVENOUS | Status: AC
  Administered 2020-03-01: 4000 [IU] via INTRAVENOUS
  Filled 2020-03-01: qty 4000

## 2020-03-01 MED ORDER — CLOPIDOGREL BISULFATE 300 MG PO TABS
300.0000 mg | ORAL_TABLET | Freq: Once | ORAL | Status: AC
  Administered 2020-03-01: 300 mg via ORAL
  Filled 2020-03-01: qty 1

## 2020-03-01 MED ORDER — PERFLUTREN LIPID MICROSPHERE
1.0000 mL | INTRAVENOUS | Status: AC | PRN
  Administered 2020-03-01: 4 mL via INTRAVENOUS
  Filled 2020-03-01: qty 10

## 2020-03-01 MED ORDER — METOPROLOL TARTRATE 12.5 MG HALF TABLET
12.5000 mg | ORAL_TABLET | Freq: Two times a day (BID) | ORAL | Status: DC
  Administered 2020-03-01: 12.5 mg via ORAL
  Filled 2020-03-01: qty 1

## 2020-03-01 MED ORDER — ASPIRIN 81 MG PO CHEW: 324.0000 mg | CHEWABLE_TABLET | ORAL | Status: AC

## 2020-03-01 MED ORDER — NITROGLYCERIN 0.4 MG SL SUBL: 0.4000 mg | SUBLINGUAL_TABLET | SUBLINGUAL | Status: DC | PRN

## 2020-03-01 MED ORDER — AMLODIPINE BESYLATE 10 MG PO TABS
10.0000 mg | ORAL_TABLET | Freq: Every day | ORAL | Status: DC
  Administered 2020-03-02: 10 mg via ORAL
  Filled 2020-03-01 (×2): qty 1

## 2020-03-01 MED ORDER — SODIUM CHLORIDE 0.9 % IV SOLN: INTRAVENOUS | Status: DC

## 2020-03-01 MED ORDER — ONDANSETRON HCL 4 MG/2ML IJ SOLN: 4.0000 mg | Freq: Four times a day (QID) | INTRAMUSCULAR | Status: DC | PRN

## 2020-03-01 MED ORDER — ASPIRIN EC 81 MG PO TBEC
81.0000 mg | DELAYED_RELEASE_TABLET | Freq: Every day | ORAL | Status: DC
  Administered 2020-03-02 – 2020-03-03 (×2): 81 mg via ORAL
  Filled 2020-03-01 (×2): qty 1

## 2020-03-01 MED ORDER — HEPARIN (PORCINE) 25000 UT/250ML-% IV SOLN
1550.0000 [IU]/h | INTRAVENOUS | Status: DC
  Administered 2020-03-01: 1400 [IU]/h via INTRAVENOUS
  Administered 2020-03-02: 1550 [IU]/h via INTRAVENOUS
  Filled 2020-03-01 (×3): qty 250

## 2020-03-01 MED ORDER — PERFLUTREN LIPID MICROSPHERE
1.0000 mL | INTRAVENOUS | Status: AC | PRN
  Filled 2020-03-01: qty 10

## 2020-03-01 MED ORDER — ATORVASTATIN CALCIUM 80 MG PO TABS
80.0000 mg | ORAL_TABLET | Freq: Every day | ORAL | Status: DC
  Administered 2020-03-01 – 2020-03-03 (×3): 80 mg via ORAL
  Filled 2020-03-01 (×3): qty 1

## 2020-03-01 MED ORDER — CALCITRIOL 0.25 MCG PO CAPS
0.5000 ug | ORAL_CAPSULE | Freq: Every day | ORAL | Status: DC
  Administered 2020-03-01 – 2020-03-03 (×3): 0.5 ug via ORAL
  Filled 2020-03-01 (×4): qty 2

## 2020-03-01 MED ORDER — INSULIN ASPART 100 UNIT/ML ~~LOC~~ SOLN
0.0000 [IU] | Freq: Three times a day (TID) | SUBCUTANEOUS | Status: DC
  Administered 2020-03-01 – 2020-03-03 (×5): 1 [IU] via SUBCUTANEOUS

## 2020-03-01 MED ORDER — METOPROLOL TARTRATE 25 MG PO TABS
25.0000 mg | ORAL_TABLET | Freq: Two times a day (BID) | ORAL | Status: DC
  Administered 2020-03-01 – 2020-03-03 (×4): 25 mg via ORAL
  Filled 2020-03-01 (×4): qty 1

## 2020-03-01 MED ORDER — AMLODIPINE BESYLATE 5 MG PO TABS
5.0000 mg | ORAL_TABLET | Freq: Every day | ORAL | Status: DC
  Administered 2020-03-01: 5 mg via ORAL
  Filled 2020-03-01: qty 1

## 2020-03-01 MED ORDER — CLOPIDOGREL BISULFATE 75 MG PO TABS
75.0000 mg | ORAL_TABLET | Freq: Every day | ORAL | Status: DC
  Administered 2020-03-01 – 2020-03-03 (×3): 75 mg via ORAL
  Filled 2020-03-01 (×3): qty 1

## 2020-03-01 MED ORDER — ACETAMINOPHEN 325 MG PO TABS
650.0000 mg | ORAL_TABLET | ORAL | Status: DC | PRN
  Administered 2020-03-01: 650 mg via ORAL
  Filled 2020-03-01: qty 2

## 2020-03-01 NOTE — Progress Notes (Signed)
Subjective:  Patient denies any chest pain or shortness of breath.  High-sensitivity troponin further elevated.  Discussed again with patient and his wife various options of treatment and wants to proceed with nuclear stress testing   Objective:  Vital Signs in the last 24 hours: Temp:  [97.9 F (36.6 C)-98.3 F (36.8 C)] 98.1 F (36.7 C) (07/30 1130) Pulse Rate:  [64-101] 76 (07/30 0900) Resp:  [13-20] 16 (07/30 0900) BP: (156-189)/(84-111) 178/104 (07/30 0900) SpO2:  [95 %-100 %] 98 % (07/30 0900) Weight:  [105.9 kg-115 kg] 105.9 kg (07/30 0450)  Intake/Output from previous day: 07/29 0701 - 07/30 0700 In: 66.6 [I.V.:66.6] Out: -  Intake/Output from this shift: Total I/O In: 31 [I.V.:31] Out: 400 [Urine:400]  Physical Exam: Exam unchanged  Lab Results: Recent Labs    02/29/20 2303 03/01/20 0743  WBC 10.0 9.0  HGB 13.7 13.5  PLT 183 170   Recent Labs    02/29/20 2303 03/01/20 0743  NA 140 140  K 4.9 4.7  CL 111 114*  CO2 17* 14*  GLUCOSE 367* 164*  BUN 59* 57*  CREATININE 4.72* 4.43*   No results for input(s): TROPONINI in the last 72 hours.  Invalid input(s): CK, MB Hepatic Function Panel No results for input(s): PROT, ALBUMIN, AST, ALT, ALKPHOS, BILITOT, BILIDIR, IBILI in the last 72 hours. Recent Labs    03/01/20 0743  CHOL 158   No results for input(s): PROTIME in the last 72 hours.  Imaging: Imaging results have been reviewed and DG Chest 2 View  Result Date: 02/29/2020 CLINICAL DATA:  Chest pain EXAM: CHEST - 2 VIEW COMPARISON:  09/30/2015 FINDINGS: Cardiac shadow is stable. Aortic calcifications are noted. The lungs are well aerated without focal infiltrate or sizable effusion. Previously seen nipple shadow on right is not well appreciated on today's. Degenerative changes of the thoracic spine are noted. Old rib fractures are noted on the left. No other focal abnormality is seen. IMPRESSION: No acute abnormality noted. Electronically Signed   By:  Inez Catalina M.D.   On: 02/29/2020 23:10   ECHOCARDIOGRAM COMPLETE  Result Date: 03/01/2020    ECHOCARDIOGRAM REPORT   Patient Name:   Jeremy Dorsey Date of Exam: 03/01/2020 Medical Rec #:  [id number]        Height:       70.0 in Accession #:    [id number]       Weight:       233.5 lb Date of Birth:  1950/11/06        BSA:          2.229 m Patient Age:    69 years         BP: Patient Gender: M                HR: Exam Location:  Referring Phys: [place]  1. Left ventricular ejection fraction, by estimation, is 50 to 55%. The left ventricle has low normal function. The left ventricle demonstrates regional wall motion abnormalities (see scoring diagram/findings for description). Left ventricular diastolic  parameters are consistent with Grade I diastolic dysfunction (impaired relaxation). There is moderate hypokinesis of the left ventricular, basal inferior wall.  2. Right ventricular systolic function is normal. The right ventricular size is normal.  3. The mitral valve is normal in structure. No evidence of mitral valve regurgitation.  4. The aortic valve is normal in structure. Aortic valve regurgitation is not visualized. Mild aortic valve sclerosis is present, with  no evidence of aortic valve stenosis.  5. The inferior vena cava is dilated in size with >50% respiratory variability, suggesting right atrial pressure of 8 mmHg. FINDINGS  Left Ventricle: Left ventricular ejection fraction, by estimation, is 50 to 55%. The left ventricle has low normal function. The left ventricle demonstrates regional wall motion abnormalities. Moderate hypokinesis of the left ventricular, basal inferior  wall. The left ventricular internal cavity size was normal in size. There is no left ventricular hypertrophy. Left ventricular diastolic parameters are consistent with Grade I diastolic dysfunction (impaired relaxation). Right Ventricle: The right ventricular size is normal. No increase in right ventricular  wall thickness. Right ventricular systolic function is normal. Left Atrium: Left atrial size was normal in size. Right Atrium: Right atrial size was normal in size. Pericardium: There is no evidence of pericardial effusion. Mitral Valve: The mitral valve is normal in structure. No evidence of mitral valve regurgitation. Tricuspid Valve: The tricuspid valve is normal in structure. Tricuspid valve regurgitation is not demonstrated. Aortic Valve: The aortic valve is normal in structure. Aortic valve regurgitation is not visualized. Mild aortic valve sclerosis is present, with no evidence of aortic valve stenosis. Pulmonic Valve: The pulmonic valve was normal in structure. Pulmonic valve regurgitation is not visualized. Aorta: The aortic root is normal in size and structure. Venous: The inferior vena cava is dilated in size with greater than 50% respiratory variability, suggesting right atrial pressure of 8 mmHg. IAS/Shunts: No atrial level shunt detected by color flow Doppler.  LEFT VENTRICLE PLAX 2D LVIDd:         5.00 cm  Diastology LVIDs:         3.70 cm  LV e' lateral:   6.09 cm/s LV PW:         1.20 cm  LV E/e' lateral: 13.5 LV IVS:        1.10 cm  LV e' medial:    6.09 cm/s LVOT diam:     2.25 cm  LV E/e' medial:  13.5 LV SV:         78 LV SV Index:   35 LVOT Area:     3.98 cm  RIGHT VENTRICLE         IVC TAPSE (M-mode): 2.4 cm  IVC diam: 2.20 cm LEFT ATRIUM           Index       RIGHT ATRIUM           Index LA diam:      3.80 cm 1.70 cm/m  RA Area:     13.00 cm LA Vol (A2C): 37.8 ml 16.96 ml/m RA Volume:   31.40 ml  14.09 ml/m LA Vol (A4C): 64.6 ml 28.98 ml/m  AORTIC VALVE LVOT Vmax:   90.10 cm/s LVOT Vmean:  63.200 cm/s LVOT VTI:    0.195 m  AORTA Ao Root diam: 3.60 cm Ao Asc diam:  3.30 cm MITRAL VALVE MV Area (PHT): 3.60 cm    SHUNTS MV Decel Time: 211 msec    Systemic VTI:  0.20 m MV E velocity: 82.30 cm/s  Systemic Diam: 2.25 cm MV A velocity: 95.10 cm/s MV E/A ratio:  0.87 Charolette Forward MD  Electronically signed by Charolette Forward MD Signature Date/Time: 03/01/2020/11:39:55 AM    Final     Cardiac Studies:  Assessment/Plan:  Acute non-STEMI Uncontrolled hypertension Uncontrolled diabetes mellitus Chronic kidney disease stage V Morbid obesity Hyperlipidemia Tobacco abuse Strong family history of coronary artery disease Plan Uptitrate nitro drip  as blood pressure tolerates Increase amlodipine to 10 mg daily Schedule for Lexiscan Myoview in a.m. Check labs in a.m.  LOS: 0 days    Charolette Forward 03/01/2020, 12:10 PM

## 2020-03-01 NOTE — Progress Notes (Signed)
  Echocardiogram 2D Echocardiogram has been performed.  Jeremy Dorsey 03/01/2020, 11:41 AM

## 2020-03-01 NOTE — ED Notes (Signed)
Attempted to call report x 1  

## 2020-03-01 NOTE — ED Provider Notes (Signed)
[facility] EMERGENCY DEPARTMENT Provider Note   CSN: [id number] Arrival date & time: 02/29/20  2241     History Chief Complaint  Patient presents with  . Chest Pain    Jeremy Dorsey is a 69 y.o. male.  Patient presents to the emergency department for evaluation of chest pain.  Patient reports that he had sudden onset of a stabbing pain in the center of his chest earlier this evening.  Patient reports that the pain radiated to both shoulders, down both arms and into his jaw bilaterally.  He was experiencing shortness of breath, nausea and diaphoresis.  He called EMS who administered aspirin and nitroglycerin.  He reports that he did have resolution of his pain with nitro.  He is currently pain-free.  He had pain for approximately 25 minutes.  Patient denies any previous heart history but he does have a significant family history.  Patient's brother died at 34 and his father died at 40, both of heart attacks.        Past Medical History:  Diagnosis Date  . Diabetes mellitus without complication ([place]) 3154  . Hypertension 2008  . Lower extremity edema   . Renal failure   . Shoulder pain    Right  . Urinary complication     Patient Active Problem List   Diagnosis Date Noted  . Cataracts, bilateral 04/29/2017  . Kidney stones 04/29/2017  . Malignant melanoma of face excluding eyelid, nose, lip, and ear ([place]) 04/29/2017  . Obesity (BMI 35.0-39.9 without comorbidity) 04/29/2017  . Pre-transplant evaluation for CKD (chronic kidney disease) 04/29/2017  . Secondary hyperparathyroidism of renal origin ([place]) 04/29/2017  . Type 2 diabetes mellitus, without long-term current use of insulin ([place]) 08/11/2016  . Chronic kidney disease (CKD), stage IV (severe) ([place]) 02/26/2015  . HLD (hyperlipidemia) 02/26/2015  . BP (high blood pressure) 02/26/2015    Past Surgical History:  Procedure Laterality Date  . COLONOSCOPY  1990  . COLONOSCOPY  06/09/2011   Dr Bary Castilla   . CYSTOSCOPY W/ RETROGRADES Bilateral 08/01/2018   Procedure: CYSTOSCOPY WITH RETROGRADE PYELOGRAM;  Surgeon: Billey Co, MD;  Location: [facility] ORS;  Service: Urology;  Laterality: Bilateral;  . CYSTOSCOPY WITH BIOPSY N/A 08/01/2018   Procedure: CYSTOSCOPY WITH Bladder BIOPSY;  Surgeon: Billey Co, MD;  Location: [facility] ORS;  Service: Urology;  Laterality: N/A;  . CYSTOSCOPY WITH STENT PLACEMENT Right 08/01/2018   Procedure: CYSTOSCOPY WITH STENT PLACEMENT;  Surgeon: Billey Co, MD;  Location: [facility] ORS;  Service: Urology;  Laterality: Right;  . EYE SURGERY Right    laser surgery  . SHOULDER ARTHROSCOPY WITH OPEN ROTATOR CUFF REPAIR Right 08/25/2018   Procedure: SHOULDER ARTHROSCOPY WITH MINI OPEN ROTATOR CUFF REPAIR;  Surgeon: Thornton Park, MD;  Location: [facility] ORS;  Service: Orthopedics;  Laterality: Right;  . TRANSURETHRAL RESECTION OF BLADDER TUMOR N/A 08/01/2018   Procedure: TRANSURETHRAL RESECTION OF BLADDER TUMOR (TURBT);  Surgeon: Billey Co, MD;  Location: [facility] ORS;  Service: Urology;  Laterality: N/A;  . URETEROSCOPY WITH HOLMIUM LASER LITHOTRIPSY Right 08/01/2018   Procedure: URETEROSCOPY WITH HOLMIUM LASER LITHOTRIPSY;  Surgeon: Billey Co, MD;  Location: [facility] ORS;  Service: Urology;  Laterality: Right;       Family History  Problem Relation Age of Onset  . Psoriasis Mother   . Heart failure Father     Social History   Tobacco Use  . Smoking status: Former Smoker    Packs/day: 1.00    Years:  30.00    Pack years: 30.00    Quit date: 08/23/2007    Years since quitting: 12.5  . Smokeless tobacco: Never Used  Vaping Use  . Vaping Use: Never used  Substance Use Topics  . Alcohol use: No  . Drug use: No    Home Medications Prior to Admission medications   Medication Sig Start Date End Date Taking? Authorizing Provider  amLODipine (NORVASC) 10 MG tablet Take 10 mg by mouth daily.  08/10/16   [provider]  calcitRIOL (ROCALTROL)  0.25 MCG capsule Take 0.25 mcg by mouth every other day. 09/08/16   [provider]  cephALEXin (KEFLEX) 500 MG capsule Take 1 capsule (500 mg total) by mouth 2 (two) times daily. Patient not taking: Reported on 05/24/2019 05/12/19   Mar Daring, PA-C  diphenhydrAMINE (BENADRYL) 25 MG tablet Take 25 mg by mouth daily.    [provider]  furosemide (LASIX) 40 MG tablet Take 40 mg by mouth 2 (two) times daily.  03/30/16   [provider]  glipiZIDE (GLUCOTROL) 10 MG tablet Take 1 tablet (10 mg total) by mouth 2 (two) times daily before a meal. Take 30 minutes before a meal. Patient taking differently: Take 5 mg by mouth 2 (two) times daily before a meal. Take 30 minutes before a meal. 02/22/17   Carmon Ginsberg, PA  ondansetron (ZOFRAN) 4 MG tablet Take 1 tablet (4 mg total) by mouth every 8 (eight) hours as needed for nausea or vomiting. Patient not taking: Reported on 05/12/2019 08/25/18   Thornton Park, MD  patiromer [facility]) 8.4 g packet Take 8.4 g by mouth every other day.    [provider]  Polyvinyl Alcohol-Povidone (REFRESH OP) Place 1 drop into both eyes daily.    [provider]  predniSONE (DELTASONE) 20 MG tablet Take 2 tablets (40 mg total) by mouth daily with breakfast. Patient not taking: Reported on 05/24/2019 05/18/19   Mar Daring, PA-C  sodium bicarbonate 650 MG tablet Take 650 mg by mouth 3 (three) times daily.     [provider]  Sulfamethoxazole-Trimethoprim (SULFAMETHOXAZOLE-TMP DS PO) Take 1 tablet by mouth daily.    [provider]  Tetrahydrozoline HCl (VISINE OP) Place 1 drop into both eyes daily as needed (dry eyes).    [provider]    Allergies    Oxycodone and Hydrocodone  Review of Systems   Review of Systems  Constitutional: Positive for diaphoresis.  Respiratory: Positive for shortness of breath.   Cardiovascular: Positive for chest pain.  Gastrointestinal: Positive  for nausea.  All other systems reviewed and are negative.   Physical Exam Updated Vital Signs BP (!) 178/111 (BP Location: Right Arm)   Pulse 85   Temp 98.3 F (36.8 C) (Oral)   Resp 17   Ht 5\' 10"  (1.778 m)   Wt (!) 115 kg   SpO2 100%   BMI 36.38 kg/m   Physical Exam Vitals and nursing note reviewed.  Constitutional:      General: He is not in acute distress.    Appearance: Normal appearance. He is well-developed.  HENT:     Head: Normocephalic and atraumatic.     Right Ear: Hearing normal.     Left Ear: Hearing normal.     Nose: Nose normal.  Eyes:     Conjunctiva/sclera: Conjunctivae normal.     Pupils: Pupils are equal, round, and reactive to light.  Cardiovascular:     Rate and Rhythm: Regular  rhythm.     Heart sounds: S1 normal and S2 normal. No murmur heard.  No friction rub. No gallop.   Pulmonary:     Effort: Pulmonary effort is normal. No respiratory distress.     Breath sounds: Normal breath sounds.  Chest:     Chest wall: No tenderness.  Abdominal:     General: Bowel sounds are normal.     Palpations: Abdomen is soft.     Tenderness: There is no abdominal tenderness. There is no guarding or rebound. Negative signs include Murphy's sign and McBurney's sign.     Hernia: No hernia is present.  Musculoskeletal:        General: Normal range of motion.     Cervical back: Normal range of motion and neck supple.  Skin:    General: Skin is warm and dry.     Findings: No rash.  Neurological:     Mental Status: He is alert and oriented to person, place, and time.     GCS: GCS eye subscore is 4. GCS verbal subscore is 5. GCS motor subscore is 6.     Cranial Nerves: No cranial nerve deficit.     Sensory: No sensory deficit.     Coordination: Coordination normal.  Psychiatric:        Speech: Speech normal.        Behavior: Behavior normal.        Thought Content: Thought content normal.     ED Results / Procedures / Treatments   Labs (all labs ordered  are listed, but only abnormal results are displayed) Labs Reviewed  BASIC METABOLIC PANEL - Abnormal; Notable for the following components:      Result Value   CO2 17 (*)    Glucose, Bld 367 (*)    BUN 59 (*)    Creatinine, Ser 4.72 (*)    Calcium 8.6 (*)    GFR calc non Af Amer 12 (*)    GFR calc Af Amer 14 (*)    All other components within normal limits  TROPONIN I (HIGH SENSITIVITY) - Abnormal; Notable for the following components:   Troponin I (High Sensitivity) 645 (*)    All other components within normal limits  TROPONIN I (HIGH SENSITIVITY) - Abnormal; Notable for the following components:   Troponin I (High Sensitivity) 935 (*)    All other components within normal limits  SARS CORONAVIRUS 2 BY RT PCR [facility] ORDER, [place] LAB)  CBC    EKG EKG Interpretation  Date/Time:  Thursday February 29 2020 22:46:18 EDT Ventricular Rate:  94 PR Interval:  196 QRS Duration: 76 QT Interval:  348 QTC Calculation: 435 R Axis:   12 Text Interpretation: Normal sinus rhythm Normal ECG Confirmed by Orpah Greek [phone]) on 03/01/2020 1:09:36 AM   Radiology DG Chest 2 View  Result Date: 02/29/2020 CLINICAL DATA:  Chest pain EXAM: CHEST - 2 VIEW COMPARISON:  09/30/2015 FINDINGS: Cardiac shadow is stable. Aortic calcifications are noted. The lungs are well aerated without focal infiltrate or sizable effusion. Previously seen nipple shadow on right is not well appreciated on today's. Degenerative changes of the thoracic spine are noted. Old rib fractures are noted on the left. No other focal abnormality is seen. IMPRESSION: No acute abnormality noted. Electronically Signed   By: Inez Catalina M.D.   On: 02/29/2020 23:10    Procedures Procedures (including critical care time)  Medications Ordered in ED Medications  sodium chloride flush (NS)  0.9 % injection 3 mL (has no administration in time range)    ED Course  I have reviewed the triage vital  signs and the nursing notes.  Pertinent labs & imaging results that were available during my care of the patient were reviewed by me and considered in my medical decision making (see chart for details).    MDM Rules/Calculators/A&P                          Patient presents to the emergency department for evaluation of chest pain.  Patient does not have any known CAD but has multiple cardiac risk factors.  Patient received aspirin and nitroglycerin by EMS and had resolution of his pain.  He is currently pain-free.  EKG is normal.  Patient does, however, have upward trending positive troponins consistent with NSTEMI.  Discussed with Dr. Terrence Dupont.  He requests addition of nitroglycerin and will see patient in emergency department.  CRITICAL CARE Performed by: Orpah Greek   Total critical care time: 30 minutes  Critical care time was exclusive of separately billable procedures and treating other patients.  Critical care was necessary to treat or prevent imminent or life-threatening deterioration.  Critical care was time spent personally by me on the following activities: development of treatment plan with patient and/or surrogate as well as nursing, discussions with consultants, evaluation of patient's response to treatment, examination of patient, obtaining history from patient or surrogate, ordering and performing treatments and interventions, ordering and review of laboratory studies, ordering and review of radiographic studies, pulse oximetry and re-evaluation of patient's condition.  Final Clinical Impression(s) / ED Diagnoses Final diagnoses:  NSTEMI (non-ST elevated myocardial infarction) [facility])    Rx / DC Orders ED Discharge Orders    None       Ineta Sinning, Gwenyth Allegra, MD 03/01/20 0151

## 2020-03-01 NOTE — Progress Notes (Signed)
Jeremy Dorsey for Heparin Indication: chest pain/ACS  Allergies  Allergen Reactions  . Oxycodone Nausea And Vomiting  . Hydrocodone Nausea And Vomiting    Patient Measurements: Height: 5\' 10"  (177.8 cm) Weight: (!) 105.9 kg (233 lb 7.5 oz) IBW/kg (Calculated) : 73 Heparin Dosing Weight: 100 kg  Vital Signs: Temp: 98.1 F (36.7 C) (07/30 1130) Temp Source: Oral (07/30 1130) BP: 144/89 (07/30 1345) Pulse Rate: 65 (07/30 1345)  Labs: Recent Labs    02/29/20 2303 03/01/20 0032 03/01/20 0743 03/01/20 1024  HGB 13.7  --  13.5  --   HCT 42.5  --  41.9  --   PLT 183  --  170  --   HEPARINUNFRC  --   --   --  0.25*  CREATININE 4.72*  --  4.43*  --   TROPONINIHS 645* 935* 1,991*  --     Estimated Creatinine Clearance: 19.2 mL/min (A) (by C-G formula based on SCr of 4.43 mg/dL (H)).   Medical History: Past Medical History:  Diagnosis Date  . Diabetes mellitus without complication ([place]) 4481  . Hypertension 2008  . Lower extremity edema   . Renal failure   . Shoulder pain    Right  . Urinary complication     Medications:  No current facility-administered medications on file prior to encounter.   Current Outpatient Medications on File Prior to Encounter  Medication Sig Dispense Refill  . calcitRIOL (ROCALTROL) 0.25 MCG capsule Take 0.25-0.5 mcg by mouth See admin instructions. Take 1 capsule (0.51mcg) by mouth all days except on Monday, Wednesday, and Friday take 2 capsules (.80mcg)  6  . diphenhydrAMINE (BENADRYL) 25 MG tablet Take 25 mg by mouth daily.    . diphenhydramine-acetaminophen (TYLENOL PM) 25-500 MG TABS tablet Take 2 tablets by mouth at bedtime as needed (pain/sleep).    . furosemide (LASIX) 40 MG tablet Take 40 mg by mouth 2 (two) times daily.     . sodium bicarbonate 650 MG tablet Take 650 mg by mouth 2 (two) times daily.     Marland Kitchen glipiZIDE (GLUCOTROL) 10 MG tablet Take 1 tablet (10 mg total) by mouth 2 (two) times daily  before a meal. Take 30 minutes before a meal. (Patient not taking: Reported on 03/01/2020) 60 tablet 5  . ondansetron (ZOFRAN) 4 MG tablet Take 1 tablet (4 mg total) by mouth every 8 (eight) hours as needed for nausea or vomiting. (Patient not taking: Reported on 05/12/2019) 30 tablet 0  . [DISCONTINUED] amLODipine (NORVASC) 10 MG tablet Take 10 mg by mouth daily.     . [DISCONTINUED] cephALEXin (KEFLEX) 500 MG capsule Take 1 capsule (500 mg total) by mouth 2 (two) times daily. (Patient not taking: Reported on 05/24/2019) 10 capsule 0  . [DISCONTINUED] patiromer (VELTASSA) 8.4 g packet Take 8.4 g by mouth every other day.    . [DISCONTINUED] Polyvinyl Alcohol-Povidone (REFRESH OP) Place 1 drop into both eyes daily.    . [DISCONTINUED] predniSONE (DELTASONE) 20 MG tablet Take 2 tablets (40 mg total) by mouth daily with breakfast. (Patient not taking: Reported on 05/24/2019) 10 tablet 0  . [DISCONTINUED] Sulfamethoxazole-Trimethoprim (SULFAMETHOXAZOLE-TMP DS PO) Take 1 tablet by mouth daily.    . [DISCONTINUED] Tetrahydrozoline HCl (VISINE OP) Place 1 drop into both eyes daily as needed (dry eyes).       Assessment: 69 y.o. male with chest pain on heparin. PLans for Lexiscan Myoview in a.m. -heparin level below goal  Goal of Therapy:  Heparin level 0.3-0.7 units/ml Monitor platelets by anticoagulation protocol: Yes   Plan:  -Increase heparin to 1550 units/hr -Heparin level in 8 hours and daily wth CBC daily  Hildred Laser, PharmD Clinical Pharmacist **Pharmacist phone directory can now be found on [email or web address] ([email or web address]).  Listed under [place].  03/01/2020,2:32 PM

## 2020-03-01 NOTE — ED Notes (Signed)
Patients wife, Jeannene Patella would like to be called with any updates and when patient gets a room [phone]

## 2020-03-01 NOTE — Progress Notes (Signed)
[facility] for Heparin Indication: chest pain/ACS  Allergies  Allergen Reactions  . Oxycodone Nausea And Vomiting  . Hydrocodone Nausea And Vomiting    Patient Measurements: Height: 5\' 10"  (177.8 cm) Weight: (!) 105.9 kg (233 lb 7.5 oz) IBW/kg (Calculated) : 73 Heparin Dosing Weight: 100 kg  Vital Signs: Temp: 98.3 F (36.8 C) (07/30 1526) Temp Source: Oral (07/30 1526) BP: 139/79 (07/30 2030) Pulse Rate: 71 (07/30 2030)  Labs: Recent Labs    02/29/20 2303 03/01/20 0032 03/01/20 0743 03/01/20 1024 03/01/20 2259  HGB 13.7  --  13.5  --   --   HCT 42.5  --  41.9  --   --   PLT 183  --  170  --   --   HEPARINUNFRC  --   --   --  0.25* 0.35  CREATININE 4.72*  --  4.43*  --   --   TROPONINIHS 645* 935* 1,991*  --   --     Estimated Creatinine Clearance: 19.2 mL/min (A) (by C-G formula based on SCr of 4.43 mg/dL (H)).   Assessment: 69 y.o. male with chest pain for heparin  Goal of Therapy:  Heparin level 0.3-0.7 units/ml Monitor platelets by anticoagulation protocol: Yes   Plan:  Continue Heparin at current rate   Charlye Spare, Bronson Curb 03/01/2020,11:48 PM

## 2020-03-01 NOTE — Progress Notes (Signed)
Inpatient Diabetes Program Recommendations  AACE/ADA: New Consensus Statement on Inpatient Glycemic Control (2015)  Target Ranges:  Prepandial:   less than 140 mg/dL      Peak postprandial:   less than 180 mg/dL (1-2 hours)      Critically ill patients:  140 - 180 mg/dL   Lab Results  Component Value Date   GLUCAP 149 (H) 03/01/2020   HGBA1C 8.0 (A) 06/10/2018    Review of Glycemic Control Results for Jeremy Dorsey, Jeremy Dorsey (MRN [id number]) as of 03/01/2020 09:08  Ref. Range 03/01/2020 08:26  Glucose-Capillary Latest Ref Range: 70 - 99 mg/dL 149 (H)   Diabetes history: DM 2 Outpatient Diabetes medications: none (on glipizide in the past) Current orders for Inpatient glycemic control:  Lantus 10 units qhs Novolog 0-6 units tid  Inpatient Diabetes Program Recommendations:    Presented with glucose in 300 range.  Note Lantus not given last night. Glucose 149 this am.   - Consider d/cing Lantus until glucose trends are consistently above 180 consistently throughout the day.  Thanks,  Tama Headings RN, MSN, BC-ADM Inpatient Diabetes Coordinator Team Pager [phone] (8a-5p)

## 2020-03-01 NOTE — Progress Notes (Signed)
ANTICOAGULATION CONSULT NOTE - Initial Consult  Pharmacy Consult for Heparin Indication: chest pain/ACS  Allergies  Allergen Reactions  . Oxycodone Nausea And Vomiting  . Hydrocodone Nausea And Vomiting    Patient Measurements: Height: 5\' 10"  (177.8 cm) Weight: (!) 115 kg (253 lb 8.5 oz) IBW/kg (Calculated) : 73 Heparin Dosing Weight: 100 kg  Vital Signs: Temp: 98.3 F (36.8 C) (07/29 2243) Temp Source: Oral (07/29 2243) BP: 178/111 (07/30 0052) Pulse Rate: 85 (07/30 0052)  Labs: Recent Labs    02/29/20 2303 03/01/20 0032  HGB 13.7  --   HCT 42.5  --   PLT 183  --   CREATININE 4.72*  --   TROPONINIHS 645* 935*    Estimated Creatinine Clearance: 18.8 mL/min (A) (by C-G formula based on SCr of 4.72 mg/dL (H)).   Medical History: Past Medical History:  Diagnosis Date  . Diabetes mellitus without complication ([place]) 6578  . Hypertension 2008  . Lower extremity edema   . Renal failure   . Shoulder pain    Right  . Urinary complication     Medications:  No current facility-administered medications on file prior to encounter.   Current Outpatient Medications on File Prior to Encounter  Medication Sig Dispense Refill  . amLODipine (NORVASC) 10 MG tablet Take 10 mg by mouth daily.     . calcitRIOL (ROCALTROL) 0.25 MCG capsule Take 0.25 mcg by mouth every other day.  6  . cephALEXin (KEFLEX) 500 MG capsule Take 1 capsule (500 mg total) by mouth 2 (two) times daily. (Patient not taking: Reported on 05/24/2019) 10 capsule 0  . diphenhydrAMINE (BENADRYL) 25 MG tablet Take 25 mg by mouth daily.    . furosemide (LASIX) 40 MG tablet Take 40 mg by mouth 2 (two) times daily.     Marland Kitchen glipiZIDE (GLUCOTROL) 10 MG tablet Take 1 tablet (10 mg total) by mouth 2 (two) times daily before a meal. Take 30 minutes before a meal. (Patient taking differently: Take 5 mg by mouth 2 (two) times daily before a meal. Take 30 minutes before a meal.) 60 tablet 5  . ondansetron (ZOFRAN) 4 MG  tablet Take 1 tablet (4 mg total) by mouth every 8 (eight) hours as needed for nausea or vomiting. (Patient not taking: Reported on 05/12/2019) 30 tablet 0  . patiromer (VELTASSA) 8.4 g packet Take 8.4 g by mouth every other day.    . Polyvinyl Alcohol-Povidone (REFRESH OP) Place 1 drop into both eyes daily.    . predniSONE (DELTASONE) 20 MG tablet Take 2 tablets (40 mg total) by mouth daily with breakfast. (Patient not taking: Reported on 05/24/2019) 10 tablet 0  . sodium bicarbonate 650 MG tablet Take 650 mg by mouth 3 (three) times daily.     . Sulfamethoxazole-Trimethoprim (SULFAMETHOXAZOLE-TMP DS PO) Take 1 tablet by mouth daily.    . Tetrahydrozoline HCl (VISINE OP) Place 1 drop into both eyes daily as needed (dry eyes).       Assessment: 69 y.o. male with chest pain for heparin  Goal of Therapy:  Heparin level 0.3-0.7 units/ml Monitor platelets by anticoagulation protocol: Yes   Plan:  Heparin 4000 units IV bolus, then start heparin 1400 units/hr Check heparin level in 8 hours.   Korver Graybeal, Bronson Curb 03/01/2020,1:22 AM

## 2020-03-01 NOTE — H&P (Signed)
Jeremy Dorsey is an 69 y.o. male.   Chief Complaint: Chest pain radiating to both arms and jaw HPI: Patient is 69 year old male with past medical history significant for hypertension, type 2 diabetes mellitus, chronic kidney disease stage IV, hyperlipidemia, morbid obesity, tobacco abuse half pack per day for 40+ years, strong family history of coronary artery disease father died of MI at the age of 2 and brother had MI at the age of 19, came to ER by EMS complaining of retrosternal chest pain described as someone sitting on the chest grade 10/10 radiating to both shoulders and arm and jaw received 4 baby aspirin and 1 sublingual nitro with relief of chest pain pain lasted for approximately 30 minutes pain resolved after receiving 1 sublingual nitro and aspirin as above.  Patient denies any nausea vomiting diaphoresis.  Denies shortness of breath.  Patient states he had similar pain but leg less severe while weed eating on Saturday did not seek any medical attention pain resolved within few minutes with rest.  Patient states he had stress test approximately 8 months ago at [facility] which was normal for evaluation for kidney transplant.  Presently patient denies any complaints.  States used to take blood pressure medicine and medicine for diabetes which he has stopped as his blood pressure and blood sugar better controlled with diet.  EKG done in the ED showed normal sinus rhythm with minimal ST elevation in lead V3 which was present in prior EKG done in December 2019.  Patient was noted to have mildly elevated high-sensitivity troponin I.  Past Medical History:  Diagnosis Date  . Diabetes mellitus without complication ([place]) 7371  . Hypertension 2008  . Lower extremity edema   . Renal failure   . Shoulder pain    Right  . Urinary complication     Past Surgical History:  Procedure Laterality Date  . COLONOSCOPY  1990  . COLONOSCOPY  06/09/2011   Dr Bary Castilla  . CYSTOSCOPY W/ RETROGRADES  Bilateral 08/01/2018   Procedure: CYSTOSCOPY WITH RETROGRADE PYELOGRAM;  Surgeon: Billey Co, MD;  Location: [facility] ORS;  Service: Urology;  Laterality: Bilateral;  . CYSTOSCOPY WITH BIOPSY N/A 08/01/2018   Procedure: CYSTOSCOPY WITH Bladder BIOPSY;  Surgeon: Billey Co, MD;  Location: [facility] OR[facility];  Service: Urology;  Laterality: N/A;  . CYSTOSCOPY WITH STENT PLACEMENT Right 08/01/2018   Procedure: CYSTOSCOPY WITH STENT PLACEMENT;  Surgeon: Billey Co, MD;  Location: [facility] OR[facility];  Service: Urology;  Laterality: Right;  . EYE SURGERY Right    laser surgery  . SHOULDER ARTHROSCOPY WITH OPEN ROTATOR CUFF REPAIR Right 08/25/2018   Procedure: SHOULDER ARTHROSCOPY WITH MINI OPEN ROTATOR CUFF REPAIR;  Surgeon: Thornton Park, MD;  Location: [facility] OR[facility];  Service: Orthopedics;  Laterality: Right;  . TRANSURETHRAL RESECTION OF BLADDER TUMOR N/A 08/01/2018   Procedure: TRANSURETHRAL RESECTION OF BLADDER TUMOR (TURBT);  Surgeon: Billey Co, MD;  Location: [facility] ORS;  Service: Urology;  Laterality: N/A;  . URETEROSCOPY WITH HOLMIUM LASER LITHOTRIPSY Right 08/01/2018   Procedure: URETEROSCOPY WITH HOLMIUM LASER LITHOTRIPSY;  Surgeon: Billey Co, MD;  Location: [facility] ORS;  Service: Urology;  Laterality: Right;    Family History  Problem Relation Age of Onset  . Psoriasis Mother   . Heart failure Father    Social History:  reports that he quit smoking about 12 years ago. He has a 30.00 pack-year smoking history. He has never used smokeless tobacco. He reports that he does not drink alcohol and  does not use drugs.  Allergies:  Allergies  Allergen Reactions  . Oxycodone Nausea And Vomiting  . Hydrocodone Nausea And Vomiting    (Not in a hospital admission)   Results for orders placed or performed during the hospital encounter of 02/29/20 (from the past 48 hour(s))  Basic metabolic panel     Status: Abnormal   Collection Time: 02/29/20 11:03 PM  Result Value Ref Range   Sodium  140 135 - 145 mmol/L   Potassium 4.9 3.5 - 5.1 mmol/L   Chloride 111 98 - 111 mmol/L   CO2 17 (L) 22 - 32 mmol/L   Glucose, Bld 367 (H) 70 - 99 mg/dL    Comment: Glucose reference range applies only to samples taken after fasting for at least 8 hours.   BUN 59 (H) 8 - 23 mg/dL   Creatinine, Ser 4.72 (H) 0.61 - 1.24 mg/dL   Calcium 8.6 (L) 8.9 - 10.3 mg/dL   GFR calc non Af Amer 12 (L) >60 mL/min   GFR calc Af Amer 14 (L) >60 mL/min   Anion gap 12 5 - 15    Comment: Performed at [facility] [address] [place] [address]  CBC     Status: None   Collection Time: 02/29/20 11:03 PM  Result Value Ref Range   WBC 10.0 4.0 - 10.5 K/uL   RBC 4.38 4.22 - 5.81 MIL/uL   Hemoglobin 13.7 13.0 - 17.0 g/dL   HCT 42.5 39 - 52 %   MCV 97.0 80.0 - 100.0 fL   MCH 31.3 26.0 - 34.0 pg   MCHC 32.2 30.0 - 36.0 g/dL   RDW 12.8 11.5 - 15.5 %   Platelets 183 150 - 400 K/uL   nRBC 0.0 0.0 - 0.2 %    Comment: Performed at [facility], [place] [address] [place], [place] [address]  Troponin I (High Sensitivity)     Status: Abnormal   Collection Time: 02/29/20 11:03 PM  Result Value Ref Range   Troponin I (High Sensitivity) 645 (HH) <18 ng/L    Comment: CRITICAL RESULT CALLED TO, READ BACK BY AND VERIFIED WITH: Hope Pigeon B,RN 03/01/20 0018 WAYK Performed at [facility], [place] [address], [place], [place] [address]   Troponin I (High Sensitivity)     Status: Abnormal   Collection Time: 03/01/20 12:32 AM  Result Value Ref Range   Troponin I (High Sensitivity) 935 (HH) <18 ng/L    Comment: CRITICAL VALUE NOTED.  VALUE IS CONSISTENT WITH PREVIOUSLY REPORTED AND CALLED VALUE. Performed at [facility], [place] [address] [place], [place] [address]    DG Chest 2 View  Result Date: 02/29/2020 CLINICAL DATA:  Chest pain EXAM: CHEST - 2 VIEW COMPARISON:  09/30/2015 FINDINGS: Cardiac shadow is stable. Aortic calcifications are noted. The lungs are well aerated without focal  infiltrate or sizable effusion. Previously seen nipple shadow on right is not well appreciated on today's. Degenerative changes of the thoracic spine are noted. Old rib fractures are noted on the left. No other focal abnormality is seen. IMPRESSION: No acute abnormality noted. Electronically Signed   By: Inez Catalina M.D.   On: 02/29/2020 23:10    Review of Systems  Constitutional: Negative for diaphoresis and fatigue.  HENT: Negative for sore throat.   Eyes: Negative for itching.  Respiratory: Negative for cough, shortness of breath and wheezing.   Cardiovascular: Positive for chest pain. Negative for palpitations.  Gastrointestinal:  Negative for abdominal distention and abdominal pain.  Genitourinary: Negative for difficulty urinating.    Blood pressure (!) 189/105, pulse 78, temperature 98.3 F (36.8 C), temperature source Oral, resp. rate 13, height 5\' 10"  (1.778 m), weight (!) 115 kg, SpO2 99 %. Physical Exam Constitutional:      Appearance: He is well-developed.  HENT:     Head: Normocephalic and atraumatic.  Neck:     Vascular: No JVD.  Cardiovascular:     Rate and Rhythm: Normal rate and regular rhythm.     Heart sounds: Gallop present. S4 sounds present.   Pulmonary:     Breath sounds: Normal breath sounds. No wheezing, rhonchi or rales.  Abdominal:     General: Bowel sounds are normal. There is no abdominal bruit.     Palpations: Abdomen is soft. There is no mass.  Musculoskeletal:     Cervical back: Neck supple.  Skin:    General: Skin is dry.  Neurological:     General: No focal deficit present.     Mental Status: He is alert and oriented to person, place, and time.      Assessment/Plan Acute non-STEMI Uncontrolled hypertension Uncontrolled diabetes mellitus Chronic kidney disease stage V Morbid obesity Hyperlipidemia Tobacco abuse Strong family history of coronary artery disease Plan As per orders Discussed with patient and his wife at length regarding  various options of treatment i.e. left cardiac catheterization possible PTCA stenting its risk and benefits i.e. death MI stroke need for emergency CABG local vascular complications staging the procedure worsening renal function requiring hemodialysis versus noninvasive nuclear stress test first to further risk stratify its  risk and benefits, patient states he absolutely do not want contrast and willing to proceed with nuclear stress test.  Charolette Forward, MD 03/01/2020, 2:43 AM

## 2020-03-02 ENCOUNTER — Inpatient Hospital Stay (HOSPITAL_COMMUNITY): Payer: PPO

## 2020-03-02 LAB — GLUCOSE, CAPILLARY
Glucose-Capillary: 142 mg/dL — ABNORMAL HIGH (ref 70–99)
Glucose-Capillary: 160 mg/dL — ABNORMAL HIGH (ref 70–99)
Glucose-Capillary: 192 mg/dL — ABNORMAL HIGH (ref 70–99)
Glucose-Capillary: 155 mg/dL — ABNORMAL HIGH (ref 70–99)

## 2020-03-02 LAB — BASIC METABOLIC PANEL
Anion gap: 9 (ref 5–15)
BUN: 61 mg/dL — ABNORMAL HIGH (ref 8–23)
CO2: 17 mmol/L — ABNORMAL LOW (ref 22–32)
Calcium: 8.4 mg/dL — ABNORMAL LOW (ref 8.9–10.3)
Chloride: 113 mmol/L — ABNORMAL HIGH (ref 98–111)
Creatinine, Ser: 4.9 mg/dL — ABNORMAL HIGH (ref 0.61–1.24)
GFR calc Af Amer: 13 mL/min — ABNORMAL LOW (ref >60)
GFR calc non Af Amer: 11 mL/min — ABNORMAL LOW (ref >60)
Glucose, Bld: 144 mg/dL — ABNORMAL HIGH (ref 70–99)
Potassium: 4.9 mmol/L (ref 3.5–5.1)
Sodium: 139 mmol/L (ref 135–145)

## 2020-03-02 LAB — LIPID PANEL
Cholesterol: 137 mg/dL (ref 0–200)
HDL: 28 mg/dL — ABNORMAL LOW (ref >40)
LDL Cholesterol: 88 mg/dL (ref 0–99)
Total CHOL/HDL Ratio: 4.9 RATIO
Triglycerides: 106 mg/dL (ref <150)
VLDL: 21 mg/dL (ref 0–40)

## 2020-03-02 LAB — CBC
HCT: 38.3 % — ABNORMAL LOW (ref 39.0–52.0)
Hemoglobin: 12.3 g/dL — ABNORMAL LOW (ref 13.0–17.0)
MCH: 30.4 pg (ref 26.0–34.0)
MCHC: 32.1 g/dL (ref 30.0–36.0)
MCV: 94.6 fL (ref 80.0–100.0)
Platelets: 170 10*3/uL (ref 150–400)
RBC: 4.05 MIL/uL — ABNORMAL LOW (ref 4.22–5.81)
RDW: 12.6 % (ref 11.5–15.5)
WBC: 11.8 10*3/uL — ABNORMAL HIGH (ref 4.0–10.5)
nRBC: 0 % (ref 0.0–0.2)

## 2020-03-02 LAB — TROPONIN I (HIGH SENSITIVITY)
Troponin I (High Sensitivity): 2196 ng/L (ref <18)
Troponin I (High Sensitivity): 2001 ng/L (ref <18)

## 2020-03-02 LAB — HEPARIN LEVEL (UNFRACTIONATED): Heparin Unfractionated: 0.39 IU/mL (ref 0.30–0.70)

## 2020-03-02 MED ORDER — ISOSORBIDE MONONITRATE ER 30 MG PO TB24
30.0000 mg | ORAL_TABLET | Freq: Every day | ORAL | Status: DC
  Administered 2020-03-02: 30 mg via ORAL
  Filled 2020-03-02 (×2): qty 1

## 2020-03-02 MED ORDER — HEPARIN SODIUM (PORCINE) 5000 UNIT/ML IJ SOLN
5000.0000 [IU] | Freq: Three times a day (TID) | INTRAMUSCULAR | Status: DC
  Administered 2020-03-02 – 2020-03-03 (×3): 5000 [IU] via SUBCUTANEOUS
  Filled 2020-03-02 (×3): qty 1

## 2020-03-02 MED ORDER — TECHNETIUM TC 99M TETROFOSMIN IV KIT
9.4200 | PACK | Freq: Once | INTRAVENOUS | Status: AC | PRN
  Administered 2020-03-02: 9.42 via INTRAVENOUS

## 2020-03-02 MED ORDER — REGADENOSON 0.4 MG/5ML IV SOLN: 0.4000 mg | Freq: Once | INTRAVENOUS | Status: AC

## 2020-03-02 MED ORDER — POLYETHYLENE GLYCOL 3350 17 G PO PACK: 17.0000 g | PACK | Freq: Every day | ORAL | Status: DC

## 2020-03-02 MED ORDER — LINAGLIPTIN 5 MG PO TABS
5.0000 mg | ORAL_TABLET | Freq: Every day | ORAL | Status: DC
  Administered 2020-03-02 – 2020-03-03 (×2): 5 mg via ORAL
  Filled 2020-03-02 (×2): qty 1

## 2020-03-02 MED ORDER — REGADENOSON 0.4 MG/5ML IV SOLN
INTRAVENOUS | Status: AC
  Administered 2020-03-02: 0.4 mg via INTRAVENOUS
  Filled 2020-03-02: qty 5

## 2020-03-02 NOTE — Progress Notes (Signed)
Dr Terrence Dupont wants to go ahead with Lexican stress test despite high troponins. States to wean off nitro for test.

## 2020-03-02 NOTE — Progress Notes (Signed)
Subjective:  Patient denies any chest pain or shortness of breath tolerated stress portion of the nuclear stress test. High-sensitivity troponin I trending down  Objective:  Vital Signs in the last 24 hours: Temp:  [98.1 F (36.7 C)-98.5 F (36.9 C)] 98.5 F (36.9 C) (07/31 0400) Pulse Rate:  [41-94] 70 (07/31 0700) Resp:  [0-24] 24 (07/31 0700) BP: (101-167)/(54-101) 154/82 (07/31 1037) SpO2:  [92 %-100 %] 99 % (07/31 0700)  Intake/Output from previous day: 07/30 0701 - 07/31 0700 In: 1360.1 [P.O.:800; I.V.:560.1] Out: 2675 [Urine:2675] Intake/Output from this shift: No intake/output data recorded.  Physical Exam: Neck: no adenopathy, no carotid bruit, no JVD and supple, symmetrical, trachea midline Lungs: clear to auscultation bilaterally Heart: regular rate and rhythm and S1, S2 normal Abdomen: soft, non-tender; bowel sounds normal; no masses,  no organomegaly Extremities: extremities normal, atraumatic, no cyanosis or edema  Lab Results: Recent Labs    03/01/20 0743 03/02/20 0111  WBC 9.0 11.8*  HGB 13.5 12.3*  PLT 170 170   Recent Labs    03/01/20 0743 03/02/20 0111  NA 140 139  K 4.7 4.9  CL 114* 113*  CO2 14* 17*  GLUCOSE 164* 144*  BUN 57* 61*  CREATININE 4.43* 4.90*   No results for input(s): TROPONINI in the last 72 hours.  Invalid input(s): CK, MB Hepatic Function Panel No results for input(s): PROT, ALBUMIN, AST, ALT, ALKPHOS, BILITOT, BILIDIR, IBILI in the last 72 hours. Recent Labs    03/02/20 0111  CHOL 137   No results for input(s): PROTIME in the last 72 hours.  Imaging: Imaging results have been reviewed and DG Chest 2 View  Result Date: 02/29/2020 CLINICAL DATA:  Chest pain EXAM: CHEST - 2 VIEW COMPARISON:  09/30/2015 FINDINGS: Cardiac shadow is stable. Aortic calcifications are noted. The lungs are well aerated without focal infiltrate or sizable effusion. Previously seen nipple shadow on right is not well appreciated on today's.  Degenerative changes of the thoracic spine are noted. Old rib fractures are noted on the left. No other focal abnormality is seen. IMPRESSION: No acute abnormality noted. Electronically Signed   By: Inez Catalina M.D.   On: 02/29/2020 23:10   ECHOCARDIOGRAM COMPLETE  Result Date: 03/01/2020    ECHOCARDIOGRAM REPORT   Patient Name:   Jeremy Dorsey Date of Exam: 03/01/2020 Medical Rec #:  [id number]        Height:       70.0 in Accession #:    [id number]       Weight:       233.5 lb Date of Birth:  06/28/1951        BSA:          2.229 m Patient Age:    69 years         BP: Patient Gender: M                HR: Exam Location:  Referring Phys: [place]  1. Left ventricular ejection fraction, by estimation, is 50 to 55%. The left ventricle has low normal function. The left ventricle demonstrates regional wall motion abnormalities (see scoring diagram/findings for description). Left ventricular diastolic  parameters are consistent with Grade I diastolic dysfunction (impaired relaxation). There is moderate hypokinesis of the left ventricular, basal inferior wall.  2. Right ventricular systolic function is normal. The right ventricular size is normal.  3. The mitral valve is normal in structure. No evidence of mitral valve regurgitation.  4. The  aortic valve is normal in structure. Aortic valve regurgitation is not visualized. Mild aortic valve sclerosis is present, with no evidence of aortic valve stenosis.  5. The inferior vena cava is dilated in size with >50% respiratory variability, suggesting right atrial pressure of 8 mmHg. FINDINGS  Left Ventricle: Left ventricular ejection fraction, by estimation, is 50 to 55%. The left ventricle has low normal function. The left ventricle demonstrates regional wall motion abnormalities. Moderate hypokinesis of the left ventricular, basal inferior  wall. The left ventricular internal cavity size was normal in size. There is no left ventricular hypertrophy.  Left ventricular diastolic parameters are consistent with Grade I diastolic dysfunction (impaired relaxation). Right Ventricle: The right ventricular size is normal. No increase in right ventricular wall thickness. Right ventricular systolic function is normal. Left Atrium: Left atrial size was normal in size. Right Atrium: Right atrial size was normal in size. Pericardium: There is no evidence of pericardial effusion. Mitral Valve: The mitral valve is normal in structure. No evidence of mitral valve regurgitation. Tricuspid Valve: The tricuspid valve is normal in structure. Tricuspid valve regurgitation is not demonstrated. Aortic Valve: The aortic valve is normal in structure. Aortic valve regurgitation is not visualized. Mild aortic valve sclerosis is present, with no evidence of aortic valve stenosis. Pulmonic Valve: The pulmonic valve was normal in structure. Pulmonic valve regurgitation is not visualized. Aorta: The aortic root is normal in size and structure. Venous: The inferior vena cava is dilated in size with greater than 50% respiratory variability, suggesting right atrial pressure of 8 mmHg. IAS/Shunts: No atrial level shunt detected by color flow Doppler.  LEFT VENTRICLE PLAX 2D LVIDd:         5.00 cm  Diastology LVIDs:         3.70 cm  LV e' lateral:   6.09 cm/s LV PW:         1.20 cm  LV E/e' lateral: 13.5 LV IVS:        1.10 cm  LV e' medial:    6.09 cm/s LVOT diam:     2.25 cm  LV E/e' medial:  13.5 LV SV:         78 LV SV Index:   35 LVOT Area:     3.98 cm  RIGHT VENTRICLE         IVC TAPSE (M-mode): 2.4 cm  IVC diam: 2.20 cm LEFT ATRIUM           Index       RIGHT ATRIUM           Index LA diam:      3.80 cm 1.70 cm/m  RA Area:     13.00 cm LA Vol (A2C): 37.8 ml 16.96 ml/m RA Volume:   31.40 ml  14.09 ml/m LA Vol (A4C): 64.6 ml 28.98 ml/m  AORTIC VALVE LVOT Vmax:   90.10 cm/s LVOT Vmean:  63.200 cm/s LVOT VTI:    0.195 m  AORTA Ao Root diam: 3.60 cm Ao Asc diam:  3.30 cm MITRAL VALVE MV  Area (PHT): 3.60 cm    SHUNTS MV Decel Time: 211 msec    Systemic VTI:  0.20 m MV E velocity: 82.30 cm/s  Systemic Diam: 2.25 cm MV A velocity: 95.10 cm/s MV E/A ratio:  0.87 Charolette Forward MD Electronically signed by Charolette Forward MD Signature Date/Time: 03/01/2020/11:39:55 AM    Final     Cardiac Studies:  Assessment/Plan:  Status post small non-Q wave MI Hypertension Diabetes mellitus  Chronic kidney disease stage V Morbid obesity Hyperlipidemia Tobacco abuse Strong family history of coronary artery disease Constipation Plan Continue present management Check nuclear stress test results. Continue IV heparin and nitro for now we will switch to oral nitro and DC heparin if no evidence of significant ischemia. DC Lantus insulin Start Tradjenta 5 mg daily Rx for constipation   LOS: 1 day    Charolette Forward 03/02/2020, 10:45 AM

## 2020-03-02 NOTE — Progress Notes (Signed)
[facility] for Heparin Indication: chest pain/ACS  Allergies  Allergen Reactions  . Oxycodone Nausea And Vomiting  . Hydrocodone Nausea And Vomiting    Patient Measurements: Height: 5\' 10"  (177.8 cm) Weight: (!) 105.9 kg (233 lb 7.5 oz) IBW/kg (Calculated) : 73 Heparin Dosing Weight: 100 kg  Vital Signs: Temp: 98.5 F (36.9 C) (07/31 0400) Temp Source: Oral (07/31 0400) BP: 136/81 (07/31 0700) Pulse Rate: 70 (07/31 0700)  Labs: Recent Labs    02/29/20 2303 03/01/20 0032 03/01/20 0743 03/01/20 1024 03/01/20 2259 03/02/20 0111 03/02/20 0350  HGB 13.7   < > 13.5  --   --  12.3*  --   HCT 42.5  --  41.9  --   --  38.3*  --   PLT 183  --  170  --   --  170  --   HEPARINUNFRC  --   --   --  0.25* 0.35 0.39  --   CREATININE 4.72*  --  4.43*  --   --  4.90*  --   TROPONINIHS 645*   < > 1,991*  --   --  2,196* 2,001*   < > = values in this interval not displayed.    Estimated Creatinine Clearance: 17.3 mL/min (A) (by C-G formula based on SCr of 4.9 mg/dL (H)).   Assessment: 69 y.o. male with chest pain continues on heparin. Heparin level this morning continues to be at goal. Hgb down slightly to 12.3, no bleeding issues noted.   Goal of Therapy:  Heparin level 0.3-0.7 units/ml Monitor platelets by anticoagulation protocol: Yes   Plan:  Continue Heparin at current rate   Erin Hearing PharmD., BCPS Clinical Pharmacist 03/02/2020 7:49 AM

## 2020-03-02 NOTE — Progress Notes (Signed)
Patient with RN, monitored during test in Nuc med and stress lab. Report given to Janett Billow RN to monitor for remaining time in [facility]. Patient stable, no chest pain, VS stable.

## 2020-03-02 NOTE — Progress Notes (Signed)
2H RN is with patient, monitoring and weaning nitro

## 2020-03-03 LAB — BASIC METABOLIC PANEL
Anion gap: 9 (ref 5–15)
BUN: 62 mg/dL — ABNORMAL HIGH (ref 8–23)
CO2: 17 mmol/L — ABNORMAL LOW (ref 22–32)
Calcium: 8.5 mg/dL — ABNORMAL LOW (ref 8.9–10.3)
Chloride: 114 mmol/L — ABNORMAL HIGH (ref 98–111)
Creatinine, Ser: 4.76 mg/dL — ABNORMAL HIGH (ref 0.61–1.24)
GFR calc Af Amer: 13 mL/min — ABNORMAL LOW (ref >60)
GFR calc non Af Amer: 12 mL/min — ABNORMAL LOW (ref >60)
Glucose, Bld: 115 mg/dL — ABNORMAL HIGH (ref 70–99)
Potassium: 4.9 mmol/L (ref 3.5–5.1)
Sodium: 140 mmol/L (ref 135–145)

## 2020-03-03 LAB — GLUCOSE, CAPILLARY
Glucose-Capillary: 107 mg/dL — ABNORMAL HIGH (ref 70–99)
Glucose-Capillary: 178 mg/dL — ABNORMAL HIGH (ref 70–99)

## 2020-03-03 LAB — CBC
HCT: 36.8 % — ABNORMAL LOW (ref 39.0–52.0)
Hemoglobin: 12.1 g/dL — ABNORMAL LOW (ref 13.0–17.0)
MCH: 31.5 pg (ref 26.0–34.0)
MCHC: 32.9 g/dL (ref 30.0–36.0)
MCV: 95.8 fL (ref 80.0–100.0)
Platelets: 154 10*3/uL (ref 150–400)
RBC: 3.84 MIL/uL — ABNORMAL LOW (ref 4.22–5.81)
RDW: 12.7 % (ref 11.5–15.5)
WBC: 10.1 10*3/uL (ref 4.0–10.5)
nRBC: 0 % (ref 0.0–0.2)

## 2020-03-03 LAB — TROPONIN I (HIGH SENSITIVITY): Troponin I (High Sensitivity): 1106 ng/L (ref <18)

## 2020-03-03 MED ORDER — NITROGLYCERIN 0.4 MG SL SUBL: 0.4000 mg | SUBLINGUAL_TABLET | SUBLINGUAL | 12 refills | Status: DC | PRN

## 2020-03-03 MED ORDER — AMLODIPINE BESYLATE 5 MG PO TABS: 5.0000 mg | ORAL_TABLET | Freq: Every day | ORAL | 3 refills | Status: DC

## 2020-03-03 MED ORDER — ASPIRIN 81 MG PO TBEC: 81.0000 mg | DELAYED_RELEASE_TABLET | Freq: Every day | ORAL | 11 refills | Status: DC

## 2020-03-03 MED ORDER — ATORVASTATIN CALCIUM 80 MG PO TABS: 80.0000 mg | ORAL_TABLET | Freq: Every day | ORAL | 3 refills | Status: DC

## 2020-03-03 MED ORDER — CLOPIDOGREL BISULFATE 75 MG PO TABS: 75.0000 mg | ORAL_TABLET | Freq: Every day | ORAL | 3 refills | Status: DC

## 2020-03-03 MED ORDER — METOPROLOL TARTRATE 25 MG PO TABS: 25.0000 mg | ORAL_TABLET | Freq: Two times a day (BID) | ORAL | 3 refills | Status: DC

## 2020-03-03 MED ORDER — SITAGLIPTIN PHOSPHATE 25 MG PO TABS: 25.0000 mg | ORAL_TABLET | Freq: Every day | ORAL | 3 refills | Status: DC

## 2020-03-03 MED ORDER — AMLODIPINE BESYLATE 5 MG PO TABS
5.0000 mg | ORAL_TABLET | Freq: Every day | ORAL | Status: DC
  Administered 2020-03-03: 5 mg via ORAL

## 2020-03-03 NOTE — Discharge Instructions (Signed)
Acute Coronary Syndrome Acute coronary syndrome (ACS) is a serious problem in which there is suddenly not enough blood and oxygen reaching the heart. ACS can result in chest pain or a heart attack. This condition is a medical emergency. If you have any symptoms of this condition, get help right away. What are the causes? This condition may be caused by:  A buildup of fat and cholesterol inside the arteries (atherosclerosis). This is the most common cause. The buildup (plaque) can cause blood vessels in the heart (coronary arteries) to become narrow or blocked, which reduces blood flow to the heart. Plaque can also break off and lead to a clot, which can block an artery and cause a heart attack or stroke.  Sudden tightening of the muscles around the coronary arteries (coronary spasm).  Tearing of a coronary artery (spontaneous coronary artery dissection).  Very low blood pressure (hypotension).  An abnormal heartbeat (arrhythmia).  Other medical conditions that cause a decrease of oxygen to the heart, such as anemiaorrespiratory failure.  Using cocaine or methamphetamine. What increases the risk? The following factors may make you more likely to develop this condition:  Age. The risk for ACS increases as you get older.  History of chest pain, heart attack, peripheral artery disease, or stroke.  Having taken chemotherapy or immune-suppressing medicines.  Being male.  Family history of chest pain, heart disease, or stroke.  Smoking.  Not exercising enough.  Being overweight.  High cholesterol.  High blood pressure (hypertension).  Diabetes.  Excessive alcohol use. What are the signs or symptoms? Common symptoms of this condition include:  Chest pain. The pain may last a long time, or it may stop and come back (recur). It may feel like: ? Crushing or squeezing. ? Tightness, pressure, fullness, or heaviness.  Arm, neck, jaw, or back pain.  Heartburn or  indigestion.  Shortness of breath.  Nausea.  Sudden cold sweats.  Light-headedness.  Dizziness or passing out.  Tiredness (fatigue). Sometimes there are no symptoms. How is this diagnosed? This condition may be diagnosed based on:  Your medical history and symptoms.  Imaging tests, such as: ? An electrocardiogram (ECG). This measures the heart's electrical activity. ? X-rays. ? CT scan. ? A coronary angiogram. For this test, dye is injected into the heart arteries and then X-rays are taken. ? Myocardial perfusion imaging. This test shows how well blood flows through your heart muscle.  Blood tests. These may be repeated at certain time intervals.  Exercise stress testing.  Echocardiogram. This is a test that uses sound waves to produce detailed images of the heart. How is this treated? Treatment for this condition may include:  Oxygen therapy.  Medicines, such as: ? Antiplatelet medicines and blood-thinning medicines, such as aspirin. These help prevent blood clots. ? Medicine that dissolves any blood clots (fibrinolytic therapy). ? Blood pressure medicines. ? Nitroglycerin. This helps widen blood vessels to improve blood flow. ? Pain medicine. ? Cholesterol-lowering medicine.  Surgery, such as: ? Coronary angioplasty with stent placement. This involves placing a small piece of metal that looks like mesh or a spring into a narrow coronary artery. This widens the artery and keeps it open. ? Coronary artery bypass surgery. This involves taking a section of a blood vessel from a different part of your body and placing it on the blocked coronary artery to allow blood to flow around the blockage.  Cardiac rehabilitation. This is a program that includes exercise training, education, and counseling to help you recover.   Follow these instructions at home: Eating and drinking  Eat a heart-healthy diet that includes whole grains, fruits and vegetables, lean proteins, and  low-fat or nonfat dairy products.  Limit how much salt (sodium) you eat as told by your health care provider. Follow instructions from your health care provider about any other eating or drinking restrictions, such as limiting foods that are high in fat and processed sugars.  Use healthy cooking methods such as roasting, grilling, broiling, baking, poaching, steaming, or stir-frying.  Work with a dietitian to follow a heart-healthy eating plan. Medicines  Take over-the-counter and prescription medicines only as told by your health care provider.  Do not take these medicines unless your health care provider approves: ? Vitamin supplements that contain vitamin A or vitamin E. ? NSAIDs, such as ibuprofen, naproxen, or celecoxib. ? Hormone replacement therapy that contains estrogen.  If you are taking blood thinners: ? Talk with your health care provider before you take any medicines that contain aspirin or NSAIDs. These medicines increase your risk for dangerous bleeding. ? Take your medicine exactly as told, at the same time every day. ? Avoid activities that could cause injury or bruising, and follow instructions about how to prevent falls. ? Wear a medical alert bracelet, and carry a card that lists what medicines you take. Activity  Follow your cardiac rehabilitation program. Do exercises as told by your physical therapist.  Ask your health care provider what activities and exercises are safe for you. Follow his or her instructions about lifting, driving, or climbing stairs. Lifestyle  Do not use any products that contain nicotine or tobacco, such as cigarettes, e-cigarettes, and chewing tobacco. If you need help quitting, ask your health care provider.  Do not drink alcohol if your health care provider tells you not to drink.  If you drink alcohol: ? Limit how much you have to 0-1 drink a day. ? Be aware of how much alcohol is in your drink. In the [place] one drink equals one 12 oz  bottle of beer (355 mL), one 5 oz glass of wine (148 mL), or one 1 oz glass of hard liquor (44 mL).  Maintain a healthy weight. If you need to lose weight, work with your health care provider to do so safely. General instructions  Tell all the health care providers who provide care for you about your heart condition, including your dentist. This may affect the medicines or treatment you receive.  Manage any other health conditions you have, such as hypertension or diabetes. These conditions affect your heart.  Pay attention to your mental health. You may be at higher risk for depression. ? Find ways to manage stress. ? Talk to your health care provider about depression screening and treatment.  Keep your vaccinations up to date. ? Get the flu shot (influenza vaccine) every year. ? Get the pneumococcal vaccine if you are age 65 or older.  If directed, monitor your blood pressure at home.  Keep all follow-up visits as told by your health care provider. This is important. Contact a health care provider if you:  Feel overwhelmed or sad.  Have trouble doing your daily activities. Get help right away if you:  Have pain in your chest, neck, arm, jaw, stomach, or back that recurs, and: ? It lasts for more than a few minutes. ? It is not relieved by taking the medicineyour health care provider prescribed.  Have unexplained: ? Heavy sweating. ? Heartburn or indigestion. ? Nausea or vomiting. ?   Shortness of breath. ? Difficulty breathing. ? Fatigue. ? Nervousness or anxiety. ? Weakness. ? Diarrhea. ? Dark stools or blood in your stool.  Have sudden light-headedness or dizziness.  Have blood pressure that is higher than 180/120.  Faint.  Have thoughts about hurting yourself. These symptoms may represent a serious problem that is an emergency. Do not wait to see if the symptoms will go away. Get medical help right away. Call your local emergency services (911 in the [place] Do  not drive yourself to the hospital.  Summary  Acute coronary syndrome (ACS) is when there is not enough blood and oxygen being supplied to the heart. ACS can result in chest pain or a heart attack.  Acute coronary syndrome is a medical emergency. If you have any symptoms of this condition, get help right away.  Treatment includes medicines and procedures to open the blocked arteries and restore blood flow. This information is not intended to replace advice given to you by your health care provider. Make sure you discuss any questions you have with your health care provider. Document Revised: 12/21/2018 Document Reviewed: 08/01/2018 Elsevier Patient Education  2020 [employer]  

## 2020-03-03 NOTE — Discharge Summary (Signed)
Discharge summary dictated on 03/03/2020 dictation number is [phone]

## 2020-03-04 ENCOUNTER — Telehealth (HOSPITAL_COMMUNITY): Payer: Self-pay

## 2020-03-04 ENCOUNTER — Other Ambulatory Visit (HOSPITAL_COMMUNITY): Payer: Self-pay

## 2020-03-04 DIAGNOSIS — I214 Non-ST elevation (NSTEMI) myocardial infarction: Secondary | ICD-10-CM

## 2020-03-04 NOTE — Discharge Summary (Signed)
NAME: Jeremy Dorsey, Jeremy Dorsey MEDICAL RECORD [id number] ACCOUNT [id number] DATE OF BIRTH:04/26/1951 FACILITY: [facility] LOCATION: [facility] PHYSICIAN:Winona Sison Daivd Council, MD  DISCHARGE SUMMARY  DATE OF DISCHARGE:  03/03/2020  ADMITTING DIAGNOSES: 1.  Acute non-ST elevation myocardial infarction. 2.  Uncontrolled hypertension. 3.  Uncontrolled diabetes mellitus. 4.  Chronic kidney disease stage IV. 5.  Morbid obesity. 6.  Hyperlipidemia. 7.  Tobacco abuse. 42.  Strong family history of coronary artery disease.  FINAL DIAGNOSES: 1.  Status post acute small non-Q-wave myocardial infarction, status post nuclear stress test, which shows minimal area of ischemia in the apical wall with inferobasal wall hypokinesia, ejection fraction of 47%, treated medically. 2.  Hypertension. 3.  Diabetes mellitus. 4.  Chronic kidney disease stage IV. 5.  Morbid obesity. 6.  Hyperlipidemia. 7.  Tobacco abuse. 12.  Strong family history of coronary artery disease.  DISCHARGE HOME MEDICATIONS: 1.  Amlodipine 5 mg 1 tablet daily. 2.  Aspirin 81 mg daily. 3.  Atorvastatin 80 mg daily. 4.  Clopidogrel 75 mg daily. 5.  Metoprolol tartrate 25 mg 1 tablet twice daily. 6.  Nitrostat 0.4 mg sublingual, use as directed. 7.  Januvia 25 mg daily.  DIET:  Low salt, low cholesterol, 1800 calories ADA diet.  DISCHARGE INSTRUCTIONS:  The patient has been advised to monitor blood pressure and blood sugar regularly and chart.  The patient will be scheduled for phase 2 cardiac rehab as outpatient.  CONDITION AT DISCHARGE:  Stable.  FOLLOWUP:  Follow up with nephrology as scheduled.  BRIEF HISTORY AND HOSPITAL COURSE:  The patient is a 69 year old male with past medical history significant for hypertension, type 2 diabetes mellitus, chronic kidney disease stage IV, hyperlipidemia, morbid obesity, tobacco abuse, half pack per day for  40+ years, strong family history of coronary artery disease.  Father died of MI at the age of  10 and brother had MI at the age of 86.  He came to the ER by EMS complaining of retrosternal chest pain described as someone sitting on the chest.  Grade  10/10, radiating to both shoulders and arm and jaw.  The patient received 4 baby aspirin and 1 sublingual nitro with relief of chest pain.  States the chest pain lasted approximately 30 minutes.  Pain resolved after sublingual nitro and aspirin as above.   The patient denies any nausea, vomiting, diaphoresis.  Denies shortness of breath.  The patient states he had similar pain, but less severe while weed-eating on Saturday.  Did not seek any medical attention; pain resolved within few minutes with rest.   The patient states he had stress test approximately eight months ago at [facility], which was normal prior to evaluation for kidney transplant, records not available.  Presently, the patient denies any chest pain.  Denies any complaints.  States  he used to take blood pressure medicine and medicine for diabetes, which he has stopped as his blood pressure and blood sugars are better controlled with diet.  EKG done in the ED showed normal sinus rhythm with minimal ST elevation in lead III which was  present on prior EKG done in 07/2018.  The patient was noted to have mildly elevated high sensitivity troponin I.  PHYSICAL EXAMINATION: GENERAL:  He was alert, awake, oriented x3. VITAL SIGNS:  Blood pressure was 189/105, pulse 78.  He was afebrile. HEENT:  Conjunctivae pink. NECK:  Supple, no JVD, no bruit. LUNGS:  Clear to auscultation without rhonchi or rales. CARDIOVASCULAR:  S1, S2 was normal.  There  was S4 gallop, no murmur. ABDOMEN:  Soft.  Bowel sounds are present, obese, nontender. EXTREMITIES:  There is no clubbing, cyanosis or edema. NEUROLOGIC:  Grossly intact.  LABORATORY DATA:  Sodium was 140, potassium 4.9, blood sugar was 367.  BUN was 59, creatinine 4.72.  Hemoglobin was 13.7, hematocrit 42.5, white count of 10.0.  His high  sensitivity troponin I was 645, 935.  Next set was 1991.  Next 2 sets were 2196,  repeat was 2001.  This morning, high sensitivity troponin I trending down to 1106.  Last electrolytes:  Sodium 140, potassium 4.9, blood sugar is 115, BUN 62, creatinine 4.76, which has been stable.  Hemoglobin is 12.1, hematocrit 36.8 which is stable  since yesterday.  White count is 10.1.  His cholesterol was 137, HDL was low at 28, LDL was 88, triglycerides were 106.  A 2D echo showed inferobasal wall hypokinesia, EF of 50-55%.  BRIEF HOSPITAL COURSE:  The patient was admitted to CCU.  The patient ruled in for small non-Q-wave myocardial infarction.  Discussed with the patient and his wife at length various options of treatment, i.e., invasive left catheterization, possible PTCA  stenting, staging the procedure, its risks and benefits.  The patient and the patient absolutely refused to receive the dye and wanted to be treated medically and agreed to go through noninvasive stress testing and if it showed a large area of ischemia,  may consider receiving IV contrast.  The patient did not have any episodes of chest pain.  The patient underwent nuclear stress testing yesterday, which showed potential small area of inducible ischemia involving the apex of the left ventricle.  There  was no evidence of prior infarction.  There was mild global hypokinesia with slightly more hypokinesia involving the basilar aspect of the inferior wall of the left ventricle with EF of 47%.  The patient is off heparin and nitro since yesterday.  The  patient did not have any episodes of chest pain during the hospital stay.  Phase 1 cardiac rehab was called.  The patient will be scheduled for phase 2 cardiac rehab as outpatient.  Discussed with the patient regarding compliance with medication,  followup and lifestyle changes.  JN/NUANCE D:03/03/2020 T:03/04/2020 JOB:[id number]

## 2020-03-11 DIAGNOSIS — E119 Type 2 diabetes mellitus without complications: Secondary | ICD-10-CM | POA: Diagnosis not present

## 2020-03-11 DIAGNOSIS — E785 Hyperlipidemia, unspecified: Secondary | ICD-10-CM | POA: Diagnosis not present

## 2020-03-11 DIAGNOSIS — I10 Essential (primary) hypertension: Secondary | ICD-10-CM | POA: Diagnosis not present

## 2020-03-11 DIAGNOSIS — I25118 Atherosclerotic heart disease of native coronary artery with other forms of angina pectoris: Secondary | ICD-10-CM | POA: Diagnosis not present

## 2020-03-12 DIAGNOSIS — D225 Melanocytic nevi of trunk: Secondary | ICD-10-CM | POA: Diagnosis not present

## 2020-03-12 DIAGNOSIS — D2261 Melanocytic nevi of right upper limb, including shoulder: Secondary | ICD-10-CM | POA: Diagnosis not present

## 2020-03-12 DIAGNOSIS — D692 Other nonthrombocytopenic purpura: Secondary | ICD-10-CM | POA: Diagnosis not present

## 2020-03-12 DIAGNOSIS — X32XXXA Exposure to sunlight, initial encounter: Secondary | ICD-10-CM | POA: Diagnosis not present

## 2020-03-12 DIAGNOSIS — D0439 Carcinoma in situ of skin of other parts of face: Secondary | ICD-10-CM | POA: Diagnosis not present

## 2020-03-12 DIAGNOSIS — L570 Actinic keratosis: Secondary | ICD-10-CM | POA: Diagnosis not present

## 2020-03-12 DIAGNOSIS — D2262 Melanocytic nevi of left upper limb, including shoulder: Secondary | ICD-10-CM | POA: Diagnosis not present

## 2020-03-12 DIAGNOSIS — C44319 Basal cell carcinoma of skin of other parts of face: Secondary | ICD-10-CM | POA: Diagnosis not present

## 2020-03-12 DIAGNOSIS — D485 Neoplasm of uncertain behavior of skin: Secondary | ICD-10-CM | POA: Diagnosis not present

## 2020-03-12 DIAGNOSIS — Z85828 Personal history of other malignant neoplasm of skin: Secondary | ICD-10-CM | POA: Diagnosis not present

## 2020-03-21 ENCOUNTER — Encounter: Payer: PPO | Attending: Cardiology

## 2020-03-21 ENCOUNTER — Other Ambulatory Visit: Payer: Self-pay

## 2020-03-21 DIAGNOSIS — Z7902 Long term (current) use of antithrombotics/antiplatelets: Secondary | ICD-10-CM | POA: Insufficient documentation

## 2020-03-21 DIAGNOSIS — Z7982 Long term (current) use of aspirin: Secondary | ICD-10-CM | POA: Insufficient documentation

## 2020-03-21 DIAGNOSIS — Z87891 Personal history of nicotine dependence: Secondary | ICD-10-CM | POA: Insufficient documentation

## 2020-03-21 DIAGNOSIS — Z7984 Long term (current) use of oral hypoglycemic drugs: Secondary | ICD-10-CM | POA: Insufficient documentation

## 2020-03-21 DIAGNOSIS — E119 Type 2 diabetes mellitus without complications: Secondary | ICD-10-CM | POA: Insufficient documentation

## 2020-03-21 DIAGNOSIS — Z79899 Other long term (current) drug therapy: Secondary | ICD-10-CM | POA: Insufficient documentation

## 2020-03-21 DIAGNOSIS — I10 Essential (primary) hypertension: Secondary | ICD-10-CM | POA: Insufficient documentation

## 2020-03-21 DIAGNOSIS — I214 Non-ST elevation (NSTEMI) myocardial infarction: Secondary | ICD-10-CM | POA: Insufficient documentation

## 2020-03-21 NOTE — Progress Notes (Signed)
Virtual Visit completed. Patient informed on EP and RD appointment and 6 Minute walk test. Patient also informed of patient health questionnaires on My Chart. Patient Verbalizes understanding. Visit diagnosis can be found in [facility] 02/29/2020.

## 2020-03-26 ENCOUNTER — Other Ambulatory Visit: Payer: Self-pay

## 2020-03-26 ENCOUNTER — Encounter: Payer: PPO | Admitting: *Deleted

## 2020-03-26 VITALS — Ht 69.0 in | Wt 231.3 lb

## 2020-03-26 DIAGNOSIS — E119 Type 2 diabetes mellitus without complications: Secondary | ICD-10-CM | POA: Diagnosis not present

## 2020-03-26 DIAGNOSIS — Z79899 Other long term (current) drug therapy: Secondary | ICD-10-CM | POA: Diagnosis not present

## 2020-03-26 DIAGNOSIS — I214 Non-ST elevation (NSTEMI) myocardial infarction: Secondary | ICD-10-CM | POA: Diagnosis not present

## 2020-03-26 DIAGNOSIS — Z7902 Long term (current) use of antithrombotics/antiplatelets: Secondary | ICD-10-CM | POA: Diagnosis not present

## 2020-03-26 DIAGNOSIS — Z7984 Long term (current) use of oral hypoglycemic drugs: Secondary | ICD-10-CM | POA: Diagnosis not present

## 2020-03-26 DIAGNOSIS — Z87891 Personal history of nicotine dependence: Secondary | ICD-10-CM | POA: Diagnosis not present

## 2020-03-26 DIAGNOSIS — Z7982 Long term (current) use of aspirin: Secondary | ICD-10-CM | POA: Diagnosis not present

## 2020-03-26 DIAGNOSIS — I10 Essential (primary) hypertension: Secondary | ICD-10-CM | POA: Diagnosis not present

## 2020-03-26 NOTE — Patient Instructions (Signed)
Patient Instructions  Patient Details  Name: Jeremy Dorsey MRN: [id number] Date of Birth: [birth year] Referring Provider:  Charolette Forward, MD  Below are your personal goals for exercise, nutrition, and risk factors. Our goal is to help you stay on track towards obtaining and maintaining these goals. We will be discussing your progress on these goals with you throughout the program.  Initial Exercise Prescription:  Initial Exercise Prescription - 03/26/20 1100      Date of Initial Exercise RX and Referring Provider   Date 03/26/20    Referring Provider Charolette Forward MD      Treadmill   MPH 1.8    Grade 0    Minutes 15    METs 2.38      Recumbant Bike   Level 1    RPM 50    Watts 10    Minutes 15    METs 2      NuStep   Level 1    SPM 80    Minutes 15    METs 2      Prescription Details   Frequency (times per week) 2    Duration Progress to 30 minutes of continuous aerobic without signs/symptoms of physical distress      Intensity   THRR 40-80% of Max Heartrate 99-134    Ratings of Perceived Exertion 11-13    Perceived Dyspnea 0-4      Progression   Progression Continue to progress workloads to maintain intensity without signs/symptoms of physical distress.      Resistance Training   Training Prescription Yes    Weight 4 lb    Reps 10-15           Exercise Goals: Frequency: Be able to perform aerobic exercise two to three times per week in program working toward 2-5 days per week of home exercise.  Intensity: Work with a perceived exertion of 11 (fairly light) - 15 (hard) while following your exercise prescription.  We will make changes to your prescription with you as you progress through the program.   Duration: Be able to do 30 to 45 minutes of continuous aerobic exercise in addition to a 5 minute warm-up and a 5 minute cool-down routine.   Nutrition Goals: Your personal nutrition goals will be established when you do your nutrition analysis with the  dietician.  The following are general nutrition guidelines to follow: Cholesterol < 200mg /day Sodium < 1500mg /day Fiber: Men over 50 yrs - 30 grams per day  Personal Goals:  Personal Goals and Risk Factors at Admission - 03/26/20 1154      Core Components/Risk Factors/Patient Goals on Admission    Weight Management Yes;Weight Loss;Obesity    Intervention Weight Management: Develop a combined nutrition and exercise program designed to reach desired caloric intake, while maintaining appropriate intake of nutrient and fiber, sodium and fats, and appropriate energy expenditure required for the weight goal.;Weight Management: Provide education and appropriate resources to help participant work on and attain dietary goals.;Weight Management/Obesity: Establish reasonable short term and long term weight goals.;Obesity: Provide education and appropriate resources to help participant work on and attain dietary goals.    Admit Weight 231 lb 4.8 oz (104.9 kg)    Goal Weight: Short Term 225 lb (102.1 kg)    Goal Weight: Long Term 220 lb (99.8 kg)    Expected Outcomes Long Term: Adherence to nutrition and physical activity/exercise program aimed toward attainment of established weight goal;Short Term: Continue to assess and modify interventions until short  term weight is achieved;Weight Loss: Understanding of general recommendations for a balanced deficit meal plan, which promotes 1-2 lb weight loss per week and includes a negative energy balance of [id number] kcal/d;Understanding recommendations for meals to include 15-35% energy as protein, 25-35% energy from fat, 35-60% energy from carbohydrates, less than 200mg  of dietary cholesterol, 20-35 gm of total fiber daily;Understanding of distribution of calorie intake throughout the day with the consumption of 4-5 meals/snacks    Diabetes Yes    Intervention Provide education about signs/symptoms and action to take for hypo/hyperglycemia.;Provide education about  proper nutrition, including hydration, and aerobic/resistive exercise prescription along with prescribed medications to achieve blood glucose in normal ranges: Fasting glucose 65-99 mg/dL    Expected Outcomes Short Term: Participant verbalizes understanding of the signs/symptoms and immediate care of hyper/hypoglycemia, proper foot care and importance of medication, aerobic/resistive exercise and nutrition plan for blood glucose control.;Long Term: Attainment of HbA1C < 7%.    Hypertension Yes    Intervention Provide education on lifestyle modifcations including regular physical activity/exercise, weight management, moderate sodium restriction and increased consumption of fresh fruit, vegetables, and low fat dairy, alcohol moderation, and smoking cessation.;Monitor prescription use compliance.    Expected Outcomes Short Term: Continued assessment and intervention until BP is < 140/75mm HG in hypertensive participants. < 130/66mm HG in hypertensive participants with diabetes, heart failure or chronic kidney disease.;Long Term: Maintenance of blood pressure at goal levels.    Lipids Yes    Intervention Provide education and support for participant on nutrition & aerobic/resistive exercise along with prescribed medications to achieve LDL 70mg , HDL >40mg .    Expected Outcomes Short Term: Participant states understanding of desired cholesterol values and is compliant with medications prescribed. Participant is following exercise prescription and nutrition guidelines.;Long Term: Cholesterol controlled with medications as prescribed, with individualized exercise RX and with personalized nutrition plan. Value goals: LDL < 70mg , HDL > 40 mg.           Tobacco Use Initial Evaluation: Social History   Tobacco Use  Smoking Status Former Smoker  . Packs/day: 1.00  . Years: 30.00  . Pack years: 30.00  . Types: Cigarettes  . Quit date: 08/23/2007  . Years since quitting: 12.6  Smokeless Tobacco Never Used     Exercise Goals and Review:  Exercise Goals    Row Name 03/26/20 1151             Exercise Goals   Increase Physical Activity Yes       Intervention Provide advice, education, support and counseling about physical activity/exercise needs.;Develop an individualized exercise prescription for aerobic and resistive training based on initial evaluation findings, risk stratification, comorbidities and participant's personal goals.       Expected Outcomes Short Term: Attend rehab on a regular basis to increase amount of physical activity.;Long Term: Add in home exercise to make exercise part of routine and to increase amount of physical activity.;Long Term: Exercising regularly at least 3-5 days a week.       Increase Strength and Stamina Yes       Intervention Provide advice, education, support and counseling about physical activity/exercise needs.;Develop an individualized exercise prescription for aerobic and resistive training based on initial evaluation findings, risk stratification, comorbidities and participant's personal goals.       Expected Outcomes Short Term: Increase workloads from initial exercise prescription for resistance, speed, and METs.;Short Term: Perform resistance training exercises routinely during rehab and add in resistance training at home;Long Term: Improve cardiorespiratory fitness, muscular  endurance and strength as measured by increased METs and functional capacity (6MWT)       Able to understand and use rate of perceived exertion (RPE) scale Yes       Intervention Provide education and explanation on how to use RPE scale       Expected Outcomes Short Term: Able to use RPE daily in rehab to express subjective intensity level;Long Term:  Able to use RPE to guide intensity level when exercising independently       Able to understand and use Dyspnea scale Yes       Intervention Provide education and explanation on how to use Dyspnea scale       Expected Outcomes Short  Term: Able to use Dyspnea scale daily in rehab to express subjective sense of shortness of breath during exertion;Long Term: Able to use Dyspnea scale to guide intensity level when exercising independently       Knowledge and understanding of Target Heart Rate Range (THRR) Yes       Intervention Provide education and explanation of THRR including how the numbers were predicted and where they are located for reference       Expected Outcomes Short Term: Able to state/look up THRR;Short Term: Able to use daily as guideline for intensity in rehab;Long Term: Able to use THRR to govern intensity when exercising independently       Able to check pulse independently Yes       Intervention Provide education and demonstration on how to check pulse in carotid and radial arteries.;Review the importance of being able to check your own pulse for safety during independent exercise       Expected Outcomes Short Term: Able to explain why pulse checking is important during independent exercise;Long Term: Able to check pulse independently and accurately       Understanding of Exercise Prescription Yes       Intervention Provide education, explanation, and written materials on patient's individual exercise prescription       Expected Outcomes Short Term: Able to explain program exercise prescription;Long Term: Able to explain home exercise prescription to exercise independently              Copy of goals given to participant.

## 2020-03-26 NOTE — Progress Notes (Signed)
Cardiac Individual Treatment Plan  Patient Details  Name: Jeremy Dorsey MRN: [id number] Date of Birth: [birth year] Referring Provider:     Cardiac Rehab from 03/26/2020 in [facility] Cardiac and Pulmonary Rehab  Referring Provider Charolette Forward MD      Initial Encounter Date:    Cardiac Rehab from 03/26/2020 in [facility] Cardiac and Pulmonary Rehab  Date 03/26/20      Visit Diagnosis: NSTEMI (non-ST elevated myocardial infarction) [facility])  Patient's Home Medications on Admission:  Current Outpatient Medications:  .  amLODipine (NORVASC) 5 MG tablet, Take 1 tablet (5 mg total) by mouth daily., Disp: 30 tablet, Rfl: 3 .  aspirin EC 81 MG EC tablet, Take 1 tablet (81 mg total) by mouth daily. Swallow whole., Disp: 30 tablet, Rfl: 11 .  atorvastatin (LIPITOR) 80 MG tablet, Take 1 tablet (80 mg total) by mouth daily., Disp: 30 tablet, Rfl: 3 .  calcitRIOL (ROCALTROL) 0.25 MCG capsule, Take 0.25-0.5 mcg by mouth See admin instructions. Take 1 capsule (0.44mg) by mouth all days except on Monday, Wednesday, and Friday take 2 capsules (.543m), Disp: , Rfl: 6 .  clopidogrel (PLAVIX) 75 MG tablet, Take 1 tablet (75 mg total) by mouth daily with breakfast., Disp: 30 tablet, Rfl: 3 .  diphenhydramine-acetaminophen (TYLENOL PM) 25-500 MG TABS tablet, Take 2 tablets by mouth at bedtime as needed (pain/sleep)., Disp: , Rfl:  .  metoprolol tartrate (LOPRESSOR) 25 MG tablet, Take 1 tablet (25 mg total) by mouth 2 (two) times daily., Disp: 60 tablet, Rfl: 3 .  nitroGLYCERIN (NITROSTAT) 0.4 MG SL tablet, Place 1 tablet (0.4 mg total) under the tongue every 5 (five) minutes x 3 doses as needed for chest pain., Disp: 25 tablet, Rfl: 12 .  sitaGLIPtin (JANUVIA) 25 MG tablet, Take 1 tablet (25 mg total) by mouth daily., Disp: 30 tablet, Rfl: 3 .  sodium bicarbonate 650 MG tablet, Take 650 mg by mouth 2 (two) times daily. , Disp: , Rfl:   Past Medical History: Past Medical History:  Diagnosis Date  . Diabetes  m[place]itus [place] complication [id number]. Hypertension 2008  . Lower extremity edema   . Renal failure   . Shoulder pain    Right  . Urinary complication     Tobacco Use: Social History   Tobacco Use  Smoking Status Former Smoker  . Packs/day: 1.00  . Years: 30.00  . Pack years: 30.00  . Types: Cigarettes  . Quit date: 08/23/2007  . Years since quitting: 12.6  Smokeless Tobacco Never Used    Labs: Recent ReChemical engineer  Labs for ITP Cardiac and Pulmonary Rehab Latest Ref Rng & Units 08/11/2016 02/22/2017 06/10/2018 03/01/2020 03/02/2020   Cholestrol 0 - 200 mg/dL - - - 158 137   LDLCALC 0 - 99 mg/dL - - - 92 88   HDL >40 mg/dL - - - 31(L) 28(L)   Trlycerides <150 mg/dL - - - 173(H) 106   Hemoglobin A1c 4.8 - 5.6 % 8.8+% 9.1 8.0(A) 7.3(H) -       Exercise Target Goals: Exercise Program Goal: Individual exercise prescription set using results from initial 6 min walk test and THRR while considering  patient's activity barriers and safety.   Exercise Prescription Goal: Initial exercise prescription builds to 30-45 minutes a day of aerobic activity, 2-3 days per week.  Home exercise guidelines will be given to patient during program as part of exercise prescription that the participant will acknowledge.   Education: Aerobic Exercise &  Resistance Training: - Gives group verbal and written instruction on the various components of exercise. Focuses on aerobic and resistive training programs and the benefits of this training and how to safely progress through these programs..   Education: Exercise & Equipment Safety: - Individual verbal instruction and demonstration of equipment use and safety with use of the equipment.   Cardiac Rehab from 03/26/2020 in [facility] Cardiac and Pulmonary Rehab  Date 03/21/20  Educator [facility]  Instruction Review Code 1- Verbalizes Understanding      Education: Exercise Physiology & General Exercise Guidelines: - Group verbal and written  instruction with models to review the exercise physiology of the cardiovascular system and associated critical values. Provides general exercise guidelines with specific guidelines to those with heart or lung disease.    Cardiac Rehab from 03/26/2020 in [facility] Cardiac and Pulmonary Rehab  Date 03/26/20  Instruction Review Code 3- Needs Reinforcement  [need identified]      Education: Flexibility, Balance, Mind/Body Relaxation: Provides group verbal/written instruction on the benefits of flexibility and balance training, including mind/body exercise modes such as yoga, pilates and tai chi.  Demonstration and skill practice provided.   Activity Barriers & Risk Stratification:  Activity Barriers & Cardiac Risk Stratification - 03/26/20 1148      Activity Barriers & Cardiac Risk Stratification   Activity Barriers Joint Problems;Deconditioning;Muscular Weakness;Shortness of Breath;Balance Concerns;History of Falls   r shoulder   Cardiac Risk Stratification High           6 Minute Walk:  6 Minute Walk    Row Name 03/26/20 1142         6 Minute Walk   Phase Initial     Distance 1000 feet     Walk Time 6 minutes     # of Rest Breaks 0     MPH 1.89     METS 2.04     RPE 12     Perceived Dyspnea  2     VO2 Peak 7.16     Symptoms Yes (comment)     Comments pain in calves (throbbing), SOB (pulling in chest from mask)     Resting HR 64 bpm     Resting BP 132/74     Resting Oxygen Saturation  98 %     Exercise Oxygen Saturation  during 6 min walk 100 %     Max Ex. HR 94 bpm     Max Ex. BP 138/72     2 Minute Post BP 126/68            Oxygen Initial Assessment:   Oxygen Re-Evaluation:   Oxygen Discharge (Final Oxygen Re-Evaluation):   Initial Exercise Prescription:  Initial Exercise Prescription - 03/26/20 1100      Date of Initial Exercise RX and Referring Provider   Date 03/26/20    Referring Provider Charolette Forward MD      Treadmill   MPH 1.8    Grade 0     Minutes 15    METs 2.38      Recumbant Bike   Level 1    RPM 50    Watts 10    Minutes 15    METs 2      NuStep   Level 1    SPM 80    Minutes 15    METs 2      Prescription Details   Frequency (times per week) 2    Duration Progress to 30 minutes of continuous aerobic without  signs/symptoms of physical distress      Intensity   THRR 40-80% of Max Heartrate 99-134    Ratings of Perceived Exertion 11-13    Perceived Dyspnea 0-4      Progression   Progression Continue to progress workloads to maintain intensity without signs/symptoms of physical distress.      Resistance Training   Training Prescription Yes    Weight 4 lb    Reps 10-15           Perform Capillary Blood Glucose checks as needed.  Exercise Prescription Changes:  Exercise Prescription Changes    Row Name 03/26/20 1100             Response to Exercise   Blood Pressure (Admit) 132/74       Blood Pressure (Exercise) 138/72       Blood Pressure (Exit) 126/68       Heart Rate (Admit) 64 bpm       Heart Rate (Exercise) 94 bpm       Heart Rate (Exit) 74 bpm       Oxygen Saturation (Admit) 98 %       Oxygen Saturation (Exercise) 100 %       Rating of Perceived Exertion (Exercise) 12       Perceived Dyspnea (Exercise) 2       Symptoms pain in calves, SOB       Comments walk test results              Exercise Comments:   Exercise Goals and Review:  Exercise Goals    Row Name 03/26/20 1151             Exercise Goals   Increase Physical Activity Yes       Intervention Provide advice, education, support and counseling about physical activity/exercise needs.;Develop an individualized exercise prescription for aerobic and resistive training based on initial evaluation findings, risk stratification, comorbidities and participant's personal goals.       Expected Outcomes Short Term: Attend rehab on a regular basis to increase amount of physical activity.;Long Term: Add in home exercise to make  exercise part of routine and to increase amount of physical activity.;Long Term: Exercising regularly at least 3-5 days a week.       Increase Strength and Stamina Yes       Intervention Provide advice, education, support and counseling about physical activity/exercise needs.;Develop an individualized exercise prescription for aerobic and resistive training based on initial evaluation findings, risk stratification, comorbidities and participant's personal goals.       Expected Outcomes Short Term: Increase workloads from initial exercise prescription for resistance, speed, and METs.;Short Term: Perform resistance training exercises routinely during rehab and add in resistance training at home;Long Term: Improve cardiorespiratory fitness, muscular endurance and strength as measured by increased METs and functional capacity (6MWT)       Able to understand and use rate of perceived exertion (RPE) scale Yes       Intervention Provide education and explanation on how to use RPE scale       Expected Outcomes Short Term: Able to use RPE daily in rehab to express subjective intensity level;Long Term:  Able to use RPE to guide intensity level when exercising independently       Able to understand and use Dyspnea scale Yes       Intervention Provide education and explanation on how to use Dyspnea scale       Expected Outcomes Short Term: Able  to use Dyspnea scale daily in rehab to express subjective sense of shortness of breath during exertion;Long Term: Able to use Dyspnea scale to guide intensity level when exercising independently       Knowledge and understanding of Target Heart Rate Range (THRR) Yes       Intervention Provide education and explanation of THRR including how the numbers were predicted and where they are located for reference       Expected Outcomes Short Term: Able to state/look up THRR;Short Term: Able to use daily as guideline for intensity in rehab;Long Term: Able to use THRR to govern  intensity when exercising independently       Able to check pulse independently Yes       Intervention Provide education and demonstration on how to check pulse in carotid and radial arteries.;Review the importance of being able to check your own pulse for safety during independent exercise       Expected Outcomes Short Term: Able to explain why pulse checking is important during independent exercise;Long Term: Able to check pulse independently and accurately       Understanding of Exercise Prescription Yes       Intervention Provide education, explanation, and written materials on patient's individual exercise prescription       Expected Outcomes Short Term: Able to explain program exercise prescription;Long Term: Able to explain home exercise prescription to exercise independently              Exercise Goals Re-Evaluation :   Discharge Exercise Prescription (Final Exercise Prescription Changes):  Exercise Prescription Changes - 03/26/20 1100      Response to Exercise   Blood Pressure (Admit) 132/74    Blood Pressure (Exercise) 138/72    Blood Pressure (Exit) 126/68    Heart Rate (Admit) 64 bpm    Heart Rate (Exercise) 94 bpm    Heart Rate (Exit) 74 bpm    Oxygen Saturation (Admit) 98 %    Oxygen Saturation (Exercise) 100 %    Rating of Perceived Exertion (Exercise) 12    Perceived Dyspnea (Exercise) 2    Symptoms pain in calves, SOB    Comments walk test results           Nutrition:  Target Goals: Understanding of nutrition guidelines, daily intake of sodium <154m, cholesterol <203m calories 30% from fat and 7% or less from saturated fats, daily to have 5 or more servings of fruits and vegetables.  Education: Controlling Sodium/Reading Food Labels -Group verbal and written material supporting the discussion of sodium use in heart healthy nutrition. Review and explanation with models, verbal and written materials for utilization of the food label.   Education: General  Nutrition Guidelines/Fats and Fiber: -Group instruction provided by verbal, written material, models and posters to present the general guidelines for heart healthy nutrition. Gives an explanation and review of dietary fats and fiber.   [facility]  Date 03/26/20  Instruction Review Code 3- Needs Reinforcement  [need identified]      Biometrics:  Pre Biometrics - 03/26/20 1152      Pre Biometrics   Height _0  (1.753 m)    Weight 231 lb 4.8 oz (104.9 kg)    BMI (Calculated) 34.14    Single Leg Stand 9.4 seconds            Nutrition Therapy Plan and Nutrition Goals:   Nutrition Assessments:  Nutrition Assessments - 03/26/20 1153  MEDFICTS Scores   Pre Score 60           MEDIFICTS Score Key:          ?70 Need to make dietary changes          40-70 Heart Healthy Diet         ? 40 Therapeutic Level Cholesterol Diet  Nutrition Goals Re-Evaluation:   Nutrition Goals Discharge (Final Nutrition Goals Re-Evaluation):   Psychosocial: Target Goals: Acknowledge presence or absence of significant depression and/or stress, maximize coping skills, provide positive support system. Participant is able to verbalize types and ability to use techniques and skills needed for reducing stress and depression.   Education: Depression - Provides group verbal and written instruction on the correlation between heart/lung disease and depressed mood, treatment options, and the stigmas associated with seeking treatment.   Education: Sleep Hygiene -Provides group verbal and written instruction about how sleep can affect your health.  Define sleep hygiene, discuss sleep cycles and impact of sleep habits. Review good sleep hygiene tips.     Education: Stress and Anxiety: - Provides group verbal and written instruction about the health risks of elevated stress and causes of high stress.  Discuss the correlation between heart/lung  disease and anxiety and treatment options. Review healthy ways to manage with stress and anxiety.    Initial Review & Psychosocial Screening:  Initial Psych Review & Screening - 03/21/20 0928      Initial Review   Current issues with Current Sleep Concerns      Family Dynamics   Good Support System? Yes    Comments He can look to his wife, son and daughters for support. He has a hard time sleeping because he has a fluid pill that he takes daily.      Barriers   Psychosocial barriers to participate in program The patient should benefit from training in stress management and relaxation.      Screening Interventions   Interventions Encouraged to exercise;Provide feedback about the scores to participant;To provide support and resources with identified psychosocial needs    Expected Outcomes Short Term goal: Utilizing psychosocial counselor, staff and physician to assist with identification of specific Stressors or current issues interfering with healing process. Setting desired goal for each stressor or current issue identified.;Long Term Goal: Stressors or current issues are controlled or eliminated.;Short Term goal: Identification and review with participant of any Quality of Life or Depression concerns found by scoring the questionnaire.;Long Term goal: The participant improves quality of Life and PHQ9 Scores as seen by post scores and/or verbalization of changes           Quality of Life Scores:   Quality of Life - 03/26/20 1153      Quality of Life   Select Quality of Life      Quality of Life Scores   Health/Function Pre 14.43 %    Socioeconomic Pre 23.07 %    Psych/Spiritual Pre 19.43 %    Family Pre 25.9 %    GLOBAL Pre 28.93 %          Scores of 19 and below usually indicate a poorer quality of life in these areas.  A difference of  2-3 points is a clinically meaningful difference.  A difference of 2-3 points in the total score of the Quality of Life Index has been  associated with significant improvement in overall quality of life, self-image, physical symptoms, and general health in studies assessing change in quality of  life.  PHQ-9: Recent Review Flowsheet Data    Depression screen [facility] 2/9 03/26/2020 05/24/2019   Decreased Interest 1 0   Down, Depressed, Hopeless 1 0   PHQ - 2 Score 2 0   Altered sleeping 3 -   Tired, decreased energy 2 -   Change in appetite 3 -   Feeling bad or failure about yourself  0 -   Trouble concentrating 0 -   Moving slowly or fidgety/restless 0 -   Suicidal thoughts 0 -   PHQ-9 Score 10 -   Difficult doing work/chores Not difficult at all -     Interpretation of Total Score  Total Score Depression Severity:  1-4 = Minimal depression, 5-9 = Mild depression, 10-14 = Moderate depression, 15-19 = Moderately severe depression, 20-27 = Severe depression   Psychosocial Evaluation and Intervention:  Psychosocial Evaluation - 03/21/20 0929      Psychosocial Evaluation & Interventions   Interventions Encouraged to exercise with the program and follow exercise prescription    Comments He can look to his wife, son and daughters for support. He has a hard time sleeping because he has a fluid pill that he takes daily.    Expected Outcomes Short: Exercise regularly to support mental health and notify staff of any changes. Long: maintain mental health and well being through teaching of rehab or prescribed medications independently.    Continue Psychosocial Services  Follow up required by staff           Psychosocial Re-Evaluation:   Psychosocial Discharge (Final Psychosocial Re-Evaluation):   Vocational Rehabilitation: Provide vocational rehab assistance to qualifying candidates.   Vocational Rehab Evaluation & Intervention:   Education: Education Goals: Education classes will be provided on a variety of topics geared toward better understanding of heart health and risk factor modification. Participant will state  understanding/return demonstration of topics presented as noted by education test scores.  Learning Barriers/Preferences:  Learning Barriers/Preferences - 03/21/20 0926      Learning Barriers/Preferences   Learning Barriers None    Learning Preferences None           General Cardiac Education Topics:  AED/CPR: - Group verbal and written instruction with the use of models to demonstrate the basic use of the AED with the basic ABC's of resuscitation.   Anatomy & Physiology of the Heart: - Group verbal and written instruction and models provide basic cardiac anatomy and physiology, with the coronary electrical and arterial systems. Review of Valvular disease and Heart Failure   Cardiac Rehab from 03/26/2020 in [facility] Cardiac and Pulmonary Rehab  Date 03/26/20  Instruction Review Code 3- Needs Reinforcement  [need identified]      Cardiac Procedures: - Group verbal and written instruction to review commonly prescribed medications for heart disease. Reviews the medication, class of the drug, and side effects. Includes the steps to properly store meds and maintain the prescription regimen. (beta blockers and nitrates)   Cardiac Medications I: - Group verbal and written instruction to review commonly prescribed medications for heart disease. Reviews the medication, class of the drug, and side effects. Includes the steps to properly store meds and maintain the prescription regimen.   Cardiac Medications II: -Group verbal and written instruction to review commonly prescribed medications for heart disease. Reviews the medication, class of the drug, and side effects. (all other drug classes)   Cardiac Rehab from 03/26/2020 in [facility] Cardiac and Pulmonary Rehab  Date 03/26/20  Instruction Review Code 3- Needs Reinforcement  [need  identified]       Go Sex-Intimacy & Heart Disease, Get SMART - Goal Setting: - Group verbal and written instruction through game format to discuss heart disease  and the return to sexual intimacy. Provides group verbal and written material to discuss and apply goal setting through the application of the S.M.A.R.T. Method.   Other Matters of the Heart: - Provides group verbal, written materials and models to describe Stable Angina and Peripheral Artery. Includes description of the disease process and treatment options available to the cardiac patient.   Infection Prevention: - Provides verbal and written material to individual with discussion of infection control including proper hand washing and proper equipment cleaning during exercise session.   Cardiac Rehab from 03/26/2020 in [facility] Cardiac and Pulmonary Rehab  Date 03/21/20  Educator [facility]  Instruction Review Code 1- Verbalizes Understanding      Falls Prevention: - Provides verbal and written material to individual with discussion of falls prevention and safety.   Cardiac Rehab from 03/26/2020 in [facility] Cardiac and Pulmonary Rehab  Date 03/21/20  Educator [facility]  Instruction Review Code 1- Verbalizes Understanding      Other: -Provides group and verbal instruction on various topics (see comments)   Knowledge Questionnaire Score:  Knowledge Questionnaire Score - 03/26/20 1153      Knowledge Questionnaire Score   Pre Score 20/26 Education Focus: Nutrition, Exercies, Anatomy, Heart Failure           Core Components/Risk Factors/Patient Goals at Admission:  Personal Goals and Risk Factors at Admission - 03/26/20 1154      Core Components/Risk Factors/Patient Goals on Admission    Weight Management Yes;Weight Loss;Obesity    Intervention Weight Management: Develop a combined nutrition and exercise program designed to reach desired caloric intake, while maintaining appropriate intake of nutrient and fiber, sodium and fats, and appropriate energy expenditure required for the weight goal.;Weight Management: Provide education and appropriate resources to help participant work on and attain dietary  goals.;Weight Management/Obesity: Establish reasonable short term and long term weight goals.;Obesity: Provide education and appropriate resources to help participant work on and attain dietary goals.    Admit Weight 231 lb 4.8 oz (104.9 kg)    Goal Weight: Short Term 225 lb (102.1 kg)    Goal Weight: Long Term 220 lb (99.8 kg)    Expected Outcomes Long Term: Adherence to nutrition and physical activity/exercise program aimed toward attainment of established weight goal;Short Term: Continue to assess and modify interventions until short term weight is achieved;Weight Loss: Understanding of general recommendations for a balanced deficit meal plan, which promotes 1-2 lb weight loss per week and includes a negative energy balance of [phone] kcal/d;Understanding recommendations for meals to include 15-35% energy as protein, 25-35% energy from fat, 35-60% energy from carbohydrates, less than 235m of dietary cholesterol, 20-35 gm of total fiber daily;Understanding of distribution of calorie intake throughout the day with the consumption of 4-5 meals/snacks    Diabetes Yes    Intervention Provide education about signs/symptoms and action to take for hypo/hyperglycemia.;Provide education about proper nutrition, including hydration, and aerobic/resistive exercise prescription along with prescribed medications to achieve blood glucose in normal ranges: Fasting glucose 65-99 mg/dL    Expected Outcomes Short Term: Participant verbalizes understanding of the signs/symptoms and immediate care of hyper/hypoglycemia, proper foot care and importance of medication, aerobic/resistive exercise and nutrition plan for blood glucose control.;Long Term: Attainment of HbA1C < 7%.    Hypertension Yes    Intervention Provide education on lifestyle modifcations  including regular physical activity/exercise, weight management, moderate sodium restriction and increased consumption of fresh fruit, vegetables, and low fat dairy, alcohol  moderation, and smoking cessation.;Monitor prescription use compliance.    Expected Outcomes Short Term: Continued assessment and intervention until BP is < 140/67m HG in hypertensive participants. < 130/864mHG in hypertensive participants with diabetes, heart failure or chronic kidney disease.;Long Term: Maintenance of blood pressure at goal levels.    Lipids Yes    Intervention Provide education and support for participant on nutrition & aerobic/resistive exercise along with prescribed medications to achieve LDL <7046mHDL >36m76m  Expected Outcomes Short Term: Participant states understanding of desired cholesterol values and is compliant with medications prescribed. Participant is following exercise prescription and nutrition guidelines.;Long Term: Cholesterol controlled with medications as prescribed, with individualized exercise RX and with personalized nutrition plan. Value goals: LDL < 70mg50mL > 40 mg.           Education:Diabetes - Individual verbal and written instruction to review signs/symptoms of diabetes, desired ranges of glucose level fasting, after meals and with exercise. Acknowledge that pre and post exercise glucose checks will be done for 3 sessions at entry of program.   [facility]  Date 03/21/20  Educator J[facility] Review Code 1- Verbalizes Understanding      Education: Know Your Numbers and Risk Factors: -Group verbal and written instruction about important numbers in your health.  Discussion of what are risk factors and how they play a role in the disease process.  Review of Cholesterol, Blood Pressure, Diabetes, and BMI and the role they play in your overall health.   [facility]  Date 03/26/20  Instruction Review Code 3- Needs Reinforcement  [need identified]      Core Components/Risk Factors/Patient Goals Review:    Core Components/Risk  Factors/Patient Goals at Discharge (Final Review):    ITP Comments:  ITP Comments    Row Name 03/21/20 0931 03/26/20 1142         ITP Comments Virtual Visit completed. Patient informed on EP and RD appointment and 6 Minute walk test. Patient also informed of patient health questionnaires on My Chart. Patient Verbalizes understanding. Visit diagnosis can be found in [facility]. Completed 6MWT and gym orientation. Initial ITP created and sent for review to Dr. Mark Emily Filbertical Director.             Comments: Initial ITP

## 2020-03-28 ENCOUNTER — Other Ambulatory Visit: Payer: Self-pay

## 2020-03-28 ENCOUNTER — Encounter: Payer: PPO | Admitting: *Deleted

## 2020-03-28 DIAGNOSIS — I214 Non-ST elevation (NSTEMI) myocardial infarction: Secondary | ICD-10-CM | POA: Diagnosis not present

## 2020-03-28 LAB — GLUCOSE, CAPILLARY
Glucose-Capillary: 142 mg/dL — ABNORMAL HIGH (ref 70–99)
Glucose-Capillary: 120 mg/dL — ABNORMAL HIGH (ref 70–99)

## 2020-03-28 NOTE — Progress Notes (Signed)
Daily Session Note  Patient Details  Name: Jeremy Dorsey MRN: [id number] Date of Birth: [birth year] Referring Provider:     Cardiac Rehab from 03/26/2020 in [facility] Cardiac [facility] Pul[facility] Rehab  Referring Provider Jeremy Forward MD      Encounter Date: 03/28/2020  Check In:  Session Check In - 03/28/20 1021      Check-In   Supervising physician immediately available to respond to emergencies See telemetry face sheet for immediately available ER MD    Location [facility]-Cardiac & Pulmonary Rehab    Staff Present Renita Papa, RN BSN;Jessica Bratenahl, MA, RCEP, CCRP, Marylynn Pearson, MS Exercise Physiologist;Susanne Bice, RN, BSN, CCRP;Melissa Caiola RDN, Rowe Pavy, BA, ACSM CEP, Exercise Physiologist    Virtual Visit No    Medication changes reported     No    Fall or balance concerns reported    No    Warm-up and Cool-down Performed on first and last piece of equipment    Resistance Training Performed Yes    VAD Patient? No    PAD/SET Patient? No      Pain Assessment   Currently in Pain? No/denies              Social History   Tobacco Use  Smoking Status Former Smoker  . Packs/day: 1.00  . Years: 30.00  . Pack years: 30.00  . Types: Cigarettes  . Quit date: 08/23/2007  . Years since quitting: 12.6  Smokeless Tobacco Never Used    Goals Met:  Independence with exercise equipment Exercise tolerated well No report of cardiac concerns or symptoms Strength training completed today  Goals Unmet:  Not Applicable  Comments: First full day of exercise!  Patient was oriented to gym and equipment including functions, settings, policies, and procedures.  Patient's individual exercise prescription and treatment plan were reviewed.  All starting workloads were established based on the results of the 6 minute walk test done at initial orientation visit.  The plan for exercise progression was also introduced and progression will be customized based on patient's  performance and goals.    Dr. Emily Filbert is Medical Director for [name].

## 2020-04-02 ENCOUNTER — Other Ambulatory Visit: Payer: Self-pay

## 2020-04-02 ENCOUNTER — Encounter: Payer: PPO | Admitting: *Deleted

## 2020-04-02 DIAGNOSIS — I214 Non-ST elevation (NSTEMI) myocardial infarction: Secondary | ICD-10-CM | POA: Diagnosis not present

## 2020-04-02 LAB — GLUCOSE, CAPILLARY
Glucose-Capillary: 170 mg/dL — ABNORMAL HIGH (ref 70–99)
Glucose-Capillary: 128 mg/dL — ABNORMAL HIGH (ref 70–99)

## 2020-04-02 NOTE — Progress Notes (Signed)
Daily Session Note  Patient Details  Name: LUNDON VERDEJO MRN: [id number] Date of Birth: 1951/07/08 Referring Provider:     Cardiac Rehab from 03/26/2020 in [facility] Re[facility]  Referring Provider Charolette Forward MD      Encounter Date: 04/02/2020  Check In:  Session Check In - 04/02/20 1039      Check-In   Supervising physician immediately available to respond to emergencies See telemetry face sheet for immediately available ER MD    Location [facility]-Cardiac & Pul[facility] Rehab    Staff Present Heath Lark, RN, BSN, Jacklynn Bue, MS Exercise Physiologist;Amanda Oletta Darter, [place], ACSM CEP, Exercise Physiologist    Virtual Visit No    Medication changes reported     No    Fall or balance concerns reported    No    Warm-up and Cool-down Performed on first and last piece of equipment    Resistance Training Performed Yes    VAD Patient? No    PAD/SET Patient? No      Pain Assessment   Currently in Pain? No/denies              Social History   Tobacco Use  Smoking Status Former Smoker  . Packs/day: 1.00  . Years: 30.00  . Pack years: 30.00  . Types: Cigarettes  . Quit date: 08/23/2007  . Years since quitting: 12.6  Smokeless Tobacco Never Used    Goals Met:  Independence with exercise equipment Exercise tolerated well No report of cardiac concerns or symptoms  Goals Unmet:  Not Applicable  Comments: Pt able to follow exercise prescription today without complaint.  Will continue to monitor for progression.    Dr. Emily Filbert is Medical Director for [place] [name].

## 2020-04-03 DIAGNOSIS — C44319 Basal cell carcinoma of skin of other parts of face: Secondary | ICD-10-CM | POA: Diagnosis not present

## 2020-04-09 ENCOUNTER — Encounter: Payer: PPO | Attending: Cardiology | Admitting: *Deleted

## 2020-04-09 ENCOUNTER — Other Ambulatory Visit: Payer: Self-pay

## 2020-04-09 DIAGNOSIS — I10 Essential (primary) hypertension: Secondary | ICD-10-CM | POA: Insufficient documentation

## 2020-04-09 DIAGNOSIS — Z7984 Long term (current) use of oral hypoglycemic drugs: Secondary | ICD-10-CM | POA: Insufficient documentation

## 2020-04-09 DIAGNOSIS — I214 Non-ST elevation (NSTEMI) myocardial infarction: Secondary | ICD-10-CM | POA: Insufficient documentation

## 2020-04-09 DIAGNOSIS — Z79899 Other long term (current) drug therapy: Secondary | ICD-10-CM | POA: Insufficient documentation

## 2020-04-09 DIAGNOSIS — E119 Type 2 diabetes mellitus without complications: Secondary | ICD-10-CM | POA: Insufficient documentation

## 2020-04-09 DIAGNOSIS — Z7982 Long term (current) use of aspirin: Secondary | ICD-10-CM | POA: Insufficient documentation

## 2020-04-09 DIAGNOSIS — Z87891 Personal history of nicotine dependence: Secondary | ICD-10-CM | POA: Insufficient documentation

## 2020-04-09 DIAGNOSIS — Z7902 Long term (current) use of antithrombotics/antiplatelets: Secondary | ICD-10-CM | POA: Insufficient documentation

## 2020-04-09 NOTE — Progress Notes (Signed)
Incomplete Session Note  Patient Details  Name: EDWEN MCLESTER MRN: [id number] Date of Birth: Nov 14, 1950 Referring Provider:     Cardiac Rehab from 03/26/2020 in [facility] Cardiac and Pul[facility] Rehab  Referring Provider Charolette Forward MD      [name] did not complete his rehab session.  Doug experienced 5/10 chest pain while walking down to cardiac rehab. He took a nitro before he came into the waiting area. He was placed on the monitor with no acute EKG changes, BP was 160/88. Chest pain was relieved within 5 minutes after 1 nitro. BP went back to baseline 600K systolic. [name] was given instructions on when to call 911. He called his doctor while he was here and waiting for his response. He has a history of angina and is medically managed.

## 2020-04-10 ENCOUNTER — Encounter: Payer: Self-pay | Admitting: *Deleted

## 2020-04-10 DIAGNOSIS — I214 Non-ST elevation (NSTEMI) myocardial infarction: Secondary | ICD-10-CM

## 2020-04-10 NOTE — Progress Notes (Signed)
Cardiac Individual Treatment Plan  Patient Details  Name: Jeremy Dorsey MRN: [id number] Date of Birth: [birth year] Referring Provider:     Cardiac Rehab from 03/26/2020 in [facility] Cardiac and Pulmonary Rehab  Referring Provider Charolette Forward MD      Initial Encounter Date:    Cardiac Rehab from 03/26/2020 in [facility] Cardiac and Pulmonary Rehab  Date 03/26/20      Visit Diagnosis: NSTEMI (non-ST elevated myocardial infarction) [facility])  Patient's Home Medications on Admission:  Current Outpatient Medications:  .  amLODipine (NORVASC) 5 MG tablet, Take 1 tablet (5 mg total) by mouth daily., Disp: 30 tablet, Rfl: 3 .  aspirin EC 81 MG EC tablet, Take 1 tablet (81 mg total) by mouth daily. Swallow whole., Disp: 30 tablet, Rfl: 11 .  atorvastatin (LIPITOR) 80 MG tablet, Take 1 tablet (80 mg total) by mouth daily., Disp: 30 tablet, Rfl: 3 .  calcitRIOL (ROCALTROL) 0.25 MCG capsule, Take 0.25-0.5 mcg by mouth See admin instructions. Take 1 capsule (0.44mg) by mouth all days except on Monday, Wednesday, and Friday take 2 capsules (.543m), Disp: , Rfl: 6 .  clopidogrel (PLAVIX) 75 MG tablet, Take 1 tablet (75 mg total) by mouth daily with breakfast., Disp: 30 tablet, Rfl: 3 .  diphenhydramine-acetaminophen (TYLENOL PM) 25-500 MG TABS tablet, Take 2 tablets by mouth at bedtime as needed (pain/sleep)., Disp: , Rfl:  .  metoprolol tartrate (LOPRESSOR) 25 MG tablet, Take 1 tablet (25 mg total) by mouth 2 (two) times daily., Disp: 60 tablet, Rfl: 3 .  nitroGLYCERIN (NITROSTAT) 0.4 MG SL tablet, Place 1 tablet (0.4 mg total) under the tongue every 5 (five) minutes x 3 doses as needed for chest pain., Disp: 25 tablet, Rfl: 12 .  sitaGLIPtin (JANUVIA) 25 MG tablet, Take 1 tablet (25 mg total) by mouth daily., Disp: 30 tablet, Rfl: 3 .  sodium bicarbonate 650 MG tablet, Take 650 mg by mouth 2 (two) times daily. , Disp: , Rfl:   Past Medical History: Past Medical History:  Diagnosis Date  . Diabetes  m[place]itus [place] complication [id number]. Hypertension 2008  . Lower extremity edema   . Renal failure   . Shoulder pain    Right  . Urinary complication     Tobacco Use: Social History   Tobacco Use  Smoking Status Former Smoker  . Packs/day: 1.00  . Years: 30.00  . Pack years: 30.00  . Types: Cigarettes  . Quit date: 08/23/2007  . Years since quitting: 12.6  Smokeless Tobacco Never Used    Labs: Recent ReChemical engineer  Labs for ITP Cardiac and Pulmonary Rehab Latest Ref Rng & Units 08/11/2016 02/22/2017 06/10/2018 03/01/2020 03/02/2020   Cholestrol 0 - 200 mg/dL - - - 158 137   LDLCALC 0 - 99 mg/dL - - - 92 88   HDL >40 mg/dL - - - 31(L) 28(L)   Trlycerides <150 mg/dL - - - 173(H) 106   Hemoglobin A1c 4.8 - 5.6 % 8.8+% 9.1 8.0(A) 7.3(H) -       Exercise Target Goals: Exercise Program Goal: Individual exercise prescription set using results from initial 6 min walk test and THRR while considering  patient's activity barriers and safety.   Exercise Prescription Goal: Initial exercise prescription builds to 30-45 minutes a day of aerobic activity, 2-3 days per week.  Home exercise guidelines will be given to patient during program as part of exercise prescription that the participant will acknowledge.   Education: Aerobic Exercise &  Resistance Training: - Gives group verbal and written instruction on the various components of exercise. Focuses on aerobic and resistive training programs and the benefits of this training and how to safely progress through these programs..   Cardiac Rehab from 03/28/2020 in [facility] Cardiac and Pulmonary Rehab  Date 03/28/20  [resistance]  Educator [facility]  Instruction Review Code 1- Verbalizes Understanding      Education: Exercise & Equipment Safety: - Individual verbal instruction and demonstration of equipment use and safety with use of the equipment.   Cardiac Rehab from 03/28/2020 in [facility] Cardiac and Pulmonary Rehab  Date 03/21/20    Educator [facility]  Instruction Review Code 1- Verbalizes Understanding      Education: Exercise Physiology & General Exercise Guidelines: - Group verbal and written instruction with models to review the exercise physiology of the cardiovascular system and associated critical values. Provides general exercise guidelines with specific guidelines to those with heart or lung disease.    Cardiac Rehab from 03/28/2020 in [facility] Cardiac and Pulmonary Rehab  Date 03/26/20  Instruction Review Code 3- Needs Reinforcement  [need identified]      Education: Flexibility, Balance, Mind/Body Relaxation: Provides group verbal/written instruction on the benefits of flexibility and balance training, including mind/body exercise modes such as yoga, pilates and tai chi.  Demonstration and skill practice provided.   Activity Barriers & Risk Stratification:  Activity Barriers & Cardiac Risk Stratification - 03/26/20 1148      Activity Barriers & Cardiac Risk Stratification   Activity Barriers Joint Problems;Deconditioning;Muscular Weakness;Shortness of Breath;Balance Concerns;History of Falls   r shoulder   Cardiac Risk Stratification High           6 Minute Walk:  6 Minute Walk    Row Name 03/26/20 1142         6 Minute Walk   Phase Initial     Distance 1000 feet     Walk Time 6 minutes     # of Rest Breaks 0     MPH 1.89     METS 2.04     RPE 12     Perceived Dyspnea  2     VO2 Peak 7.16     Symptoms Yes (comment)     Comments pain in calves (throbbing), SOB (pulling in chest from mask)     Resting HR 64 bpm     Resting BP 132/74     Resting Oxygen Saturation  98 %     Exercise Oxygen Saturation  during 6 min walk 100 %     Max Ex. HR 94 bpm     Max Ex. BP 138/72     2 Minute Post BP 126/68            Oxygen Initial Assessment:   Oxygen Re-Evaluation:   Oxygen Discharge (Final Oxygen Re-Evaluation):   Initial Exercise Prescription:  Initial Exercise Prescription - 03/26/20  1100      Date of Initial Exercise RX and Referring Provider   Date 03/26/20    Referring Provider Charolette Forward MD      Treadmill   MPH 1.8    Grade 0    Minutes 15    METs 2.38      Recumbant Bike   Level 1    RPM 50    Watts 10    Minutes 15    METs 2      NuStep   Level 1    SPM 80    Minutes 15  METs 2      Prescription Details   Frequency (times per week) 2    Duration Progress to 30 minutes of continuous aerobic without signs/symptoms of physical distress      Intensity   THRR 40-80% of Max Heartrate 99-134    Ratings of Perceived Exertion 11-13    Perceived Dyspnea 0-4      Progression   Progression Continue to progress workloads to maintain intensity without signs/symptoms of physical distress.      Resistance Training   Training Prescription Yes    Weight 4 lb    Reps 10-15           Perform Capillary Blood Glucose checks as needed.  Exercise Prescription Changes:  Exercise Prescription Changes    Row Name 03/26/20 1100             Response to Exercise   Blood Pressure (Admit) 132/74       Blood Pressure (Exercise) 138/72       Blood Pressure (Exit) 126/68       Heart Rate (Admit) 64 bpm       Heart Rate (Exercise) 94 bpm       Heart Rate (Exit) 74 bpm       Oxygen Saturation (Admit) 98 %       Oxygen Saturation (Exercise) 100 %       Rating of Perceived Exertion (Exercise) 12       Perceived Dyspnea (Exercise) 2       Symptoms pain in calves, SOB       Comments walk test results              Exercise Comments:   Exercise Goals and Review:  Exercise Goals    Row Name 03/26/20 1151             Exercise Goals   Increase Physical Activity Yes       Intervention Provide advice, education, support and counseling about physical activity/exercise needs.;Develop an individualized exercise prescription for aerobic and resistive training based on initial evaluation findings, risk stratification, comorbidities and participant's  personal goals.       Expected Outcomes Short Term: Attend rehab on a regular basis to increase amount of physical activity.;Long Term: Add in home exercise to make exercise part of routine and to increase amount of physical activity.;Long Term: Exercising regularly at least 3-5 days a week.       Increase Strength and Stamina Yes       Intervention Provide advice, education, support and counseling about physical activity/exercise needs.;Develop an individualized exercise prescription for aerobic and resistive training based on initial evaluation findings, risk stratification, comorbidities and participant's personal goals.       Expected Outcomes Short Term: Increase workloads from initial exercise prescription for resistance, speed, and METs.;Short Term: Perform resistance training exercises routinely during rehab and add in resistance training at home;Long Term: Improve cardiorespiratory fitness, muscular endurance and strength as measured by increased METs and functional capacity (6MWT)       Able to understand and use rate of perceived exertion (RPE) scale Yes       Intervention Provide education and explanation on how to use RPE scale       Expected Outcomes Short Term: Able to use RPE daily in rehab to express subjective intensity level;Long Term:  Able to use RPE to guide intensity level when exercising independently       Able to understand and use Dyspnea scale Yes  Intervention Provide education and explanation on how to use Dyspnea scale       Expected Outcomes Short Term: Able to use Dyspnea scale daily in rehab to express subjective sense of shortness of breath during exertion;Long Term: Able to use Dyspnea scale to guide intensity level when exercising independently       Knowledge and understanding of Target Heart Rate Range (THRR) Yes       Intervention Provide education and explanation of THRR including how the numbers were predicted and where they are located for reference        Expected Outcomes Short Term: Able to state/look up THRR;Short Term: Able to use daily as guideline for intensity in rehab;Long Term: Able to use THRR to govern intensity when exercising independently       Able to check pulse independently Yes       Intervention Provide education and demonstration on how to check pulse in carotid and radial arteries.;Review the importance of being able to check your own pulse for safety during independent exercise       Expected Outcomes Short Term: Able to explain why pulse checking is important during independent exercise;Long Term: Able to check pulse independently and accurately       Understanding of Exercise Prescription Yes       Intervention Provide education, explanation, and written materials on patient's individual exercise prescription       Expected Outcomes Short Term: Able to explain program exercise prescription;Long Term: Able to explain home exercise prescription to exercise independently              Exercise Goals Re-Evaluation :  Exercise Goals Re-Evaluation    [place] Name 03/28/20 1022 04/09/20 1604           Exercise Goal Re-Evaluation   Exercise Goals Review Increase Physical Activity;Able to understand and use rate of perceived exertion (RPE) scale;Knowledge and understanding of Target Heart Rate Range (THRR);Understanding of Exercise Prescription;Increase Strength and Stamina;Able to check pulse independently Increase Physical Activity;Increase Strength and Stamina;Understanding of Exercise Prescription      Comments Reviewed RPE and dyspnea scales, THR and program prescription with pt today.  Pt voiced understanding and was given a copy of goals to take home. [name] was off to a good start in rehab, until the chest pain today.  Last week he went to the beach.  He has completed his first two full days of exercise.  We will continue to monitor his progress.      Expected Outcomes Short: Use RPE daily to regulate intensity. Long: Follow  program prescription in THR. Short: Continue to attend rehab regularly  Long: Continue to follow program prescription.             Discharge Exercise Prescription (Final Exercise Prescription Changes):  Exercise Prescription Changes - 03/26/20 1100      Response to Exercise   Blood Pressure (Admit) 132/74    Blood Pressure (Exercise) 138/72    Blood Pressure (Exit) 126/68    Heart Rate (Admit) 64 bpm    Heart Rate (Exercise) 94 bpm    Heart Rate (Exit) 74 bpm    Oxygen Saturation (Admit) 98 %    Oxygen Saturation (Exercise) 100 %    Rating of Perceived Exertion (Exercise) 12    Perceived Dyspnea (Exercise) 2    Symptoms pain in calves, SOB    Comments walk test results           Nutrition:  Target Goals: Understanding of  nutrition guidelines, daily intake of sodium '1500mg'$ , cholesterol '200mg'$ , calories 30% from fat and 7% or less from saturated fats, daily to have 5 or more servings of fruits and vegetables.  Education: Controlling Sodium/Reading Food Labels -Group verbal and written material supporting the discussion of sodium use in heart healthy nutrition. Review and explanation with models, verbal and written materials for utilization of the food label.   Education: General Nutrition Guidelines/Fats and Fiber: -Group instruction provided by verbal, written material, models and posters to present the general guidelines for heart healthy nutrition. Gives an explanation and review of dietary fats and fiber.   Cardiac Rehab from 03/28/2020 in [facility] Cardiac and Pulmonary Rehab  Date 03/26/20  Instruction Review Code 3- Needs Reinforcement  [need identified]      Biometrics:  Pre Biometrics - 03/26/20 1152      Pre Biometrics   Height $Remov'5\' 9"'VaEQmX$  (1.753 m)    Weight 231 lb 4.8 oz (104.9 kg)    BMI (Calculated) 34.14    Single Leg Stand 9.4 seconds            Nutrition Therapy Plan and Nutrition Goals:   Nutrition Assessments:  Nutrition Assessments - 03/26/20 1153        MEDFICTS Scores   Pre Score 60           MEDIFICTS Score Key:          ?70 Need to make dietary changes          40-70 Heart Healthy Diet         ? 40 Therapeutic Level Cholesterol Diet  Nutrition Goals Re-Evaluation:   Nutrition Goals Discharge (Final Nutrition Goals Re-Evaluation):   Psychosocial: Target Goals: Acknowledge presence or absence of significant depression and/or stress, maximize coping skills, provide positive support system. Participant is able to verbalize types and ability to use techniques and skills needed for reducing stress and depression.   Education: Depression - Provides group verbal and written instruction on the correlation between heart/lung disease and depressed mood, treatment options, and the stigmas associated with seeking treatment.   Education: Sleep Hygiene -Provides group verbal and written instruction about how sleep can affect your health.  Define sleep hygiene, discuss sleep cycles and impact of sleep habits. Review good sleep hygiene tips.     Education: Stress and Anxiety: - Provides group verbal and written instruction about the health risks of elevated stress and causes of high stress.  Discuss the correlation between heart/lung disease and anxiety and treatment options. Review healthy ways to manage with stress and anxiety.    Initial Review & Psychosocial Screening:  Initial Psych Review & Screening - 03/21/20 0928      Initial Review   Current issues with Current Sleep Concerns      Family Dynamics   Good Support System? Yes    Comments He can look to his wife, son and daughters for support. He has a hard time sleeping because he has a fluid pill that he takes daily.      Barriers   Psychosocial barriers to participate in program The patient should benefit from training in stress management and relaxation.      Screening Interventions   Interventions Encouraged to exercise;Provide feedback about the scores to  participant;To provide support and resources with identified psychosocial needs    Expected Outcomes Short Term goal: Utilizing psychosocial counselor, staff and physician to assist with identification of specific Stressors or current issues interfering with healing process. Setting desired goal  for each stressor or current issue identified.;Long Term Goal: Stressors or current issues are controlled or eliminated.;Short Term goal: Identification and review with participant of any Quality of Life or Depression concerns found by scoring the questionnaire.;Long Term goal: The participant improves quality of Life and PHQ9 Scores as seen by post scores and/or verbalization of changes           Quality of Life Scores:   Quality of Life - 03/26/20 1153      Quality of Life   Select Quality of Life      Quality of Life Scores   Health/Function Pre 14.43 %    Socioeconomic Pre 23.07 %    Psych/Spiritual Pre 19.43 %    Family Pre 25.9 %    GLOBAL Pre 28.93 %          Scores of 19 and below usually indicate a poorer quality of life in these areas.  A difference of  2-3 points is a clinically meaningful difference.  A difference of 2-3 points in the total score of the Quality of Life Index has been associated with significant improvement in overall quality of life, self-image, physical symptoms, and general health in studies assessing change in quality of life.  PHQ-9: Recent Review Flowsheet Data    Depression screen [facility] 2/9 03/26/2020 05/24/2019   Decreased Interest 1 0   Down, Depressed, Hopeless 1 0   PHQ - 2 Score 2 0   Altered sleeping 3 -   Tired, decreased energy 2 -   Change in appetite 3 -   Feeling bad or failure about yourself  0 -   Trouble concentrating 0 -   Moving slowly or fidgety/restless 0 -   Suicidal thoughts 0 -   PHQ-9 Score 10 -   Difficult doing work/chores Not difficult at all -     Interpretation of Total Score  Total Score Depression Severity:  1-4 = Minimal  depression, 5-9 = Mild depression, 10-14 = Moderate depression, 15-19 = Moderately severe depression, 20-27 = Severe depression   Psychosocial Evaluation and Intervention:  Psychosocial Evaluation - 03/21/20 0929      Psychosocial Evaluation & Interventions   Interventions Encouraged to exercise with the program and follow exercise prescription    Comments He can look to his wife, son and daughters for support. He has a hard time sleeping because he has a fluid pill that he takes daily.    Expected Outcomes Short: Exercise regularly to support mental health and notify staff of any changes. Long: maintain mental health and well being through teaching of rehab or prescribed medications independently.    Continue Psychosocial Services  Follow up required by staff           Psychosocial Re-Evaluation:   Psychosocial Discharge (Final Psychosocial Re-Evaluation):   Vocational Rehabilitation: Provide vocational rehab assistance to qualifying candidates.   Vocational Rehab Evaluation & Intervention:   Education: Education Goals: Education classes will be provided on a variety of topics geared toward better understanding of heart health and risk factor modification. Participant will state understanding/return demonstration of topics presented as noted by education test scores.  Learning Barriers/Preferences:  Learning Barriers/Preferences - 03/21/20 0926      Learning Barriers/Preferences   Learning Barriers None    Learning Preferences None           General Cardiac Education Topics:  AED/CPR: - Group verbal and written instruction with the use of models to demonstrate the basic use of the  AED with the basic ABC's of resuscitation.   Anatomy & Physiology of the Heart: - Group verbal and written instruction and models provide basic cardiac anatomy and physiology, with the coronary electrical and arterial systems. Review of Valvular disease and Heart Failure   Cardiac Rehab  from 03/28/2020 in [facility] Cardiac and Pulmonary Rehab  Date 03/26/20  Instruction Review Code 3- Needs Reinforcement  [need identified]      Cardiac Procedures: - Group verbal and written instruction to review commonly prescribed medications for heart disease. Reviews the medication, class of the drug, and side effects. Includes the steps to properly store meds and maintain the prescription regimen. (beta blockers and nitrates)   Cardiac Rehab from 03/28/2020 in [facility] Cardiac and Pulmonary Rehab  Date 03/28/20  Educator SB  Instruction Review Code 1- Verbalizes Understanding      Cardiac Medications I: - Group verbal and written instruction to review commonly prescribed medications for heart disease. Reviews the medication, class of the drug, and side effects. Includes the steps to properly store meds and maintain the prescription regimen.   Cardiac Medications II: -Group verbal and written instruction to review commonly prescribed medications for heart disease. Reviews the medication, class of the drug, and side effects. (all other drug classes)   Cardiac Rehab from 03/28/2020 in [facility] Cardiac and Pulmonary Rehab  Date 03/26/20  Instruction Review Code 3- Needs Reinforcement  [need identified]       Go Sex-Intimacy & Heart Disease, Get SMART - Goal Setting: - Group verbal and written instruction through game format to discuss heart disease and the return to sexual intimacy. Provides group verbal and written material to discuss and apply goal setting through the application of the S.M.A.R.T. Method.   Cardiac Rehab from 03/28/2020 in [facility] Cardiac and Pulmonary Rehab  Date 03/28/20  Educator SB  Instruction Review Code 1- Verbalizes Understanding      Other Matters of the Heart: - Provides group verbal, written materials and models to describe Stable Angina and Peripheral Artery. Includes description of the disease process and treatment options available to the cardiac  patient.   Infection Prevention: - Provides verbal and written material to individual with discussion of infection control including proper hand washing and proper equipment cleaning during exercise session.   Cardiac Rehab from 03/28/2020 in [facility] Cardiac and Pulmonary Rehab  Date 03/21/20  Educator [facility]  Instruction Review Code 1- Verbalizes Understanding      Falls Prevention: - Provides verbal and written material to individual with discussion of falls prevention and safety.   Cardiac Rehab from 03/28/2020 in [facility] Cardiac and Pulmonary Rehab  Date 03/21/20  Educator [facility]  Instruction Review Code 1- Verbalizes Understanding      Other: -Provides group and verbal instruction on various topics (see comments)   Knowledge Questionnaire Score:  Knowledge Questionnaire Score - 03/26/20 1153      Knowledge Questionnaire Score   Pre Score 20/26 Education Focus: Nutrition, Exercies, Anatomy, Heart Failure           Core Components/Risk Factors/Patient Goals at Admission:  Personal Goals and Risk Factors at Admission - 03/26/20 1154      Core Components/Risk Factors/Patient Goals on Admission    Weight Management Yes;Weight Loss;Obesity    Intervention Weight Management: Develop a combined nutrition and exercise program designed to reach desired caloric intake, while maintaining appropriate intake of nutrient and fiber, sodium and fats, and appropriate energy expenditure required for the weight goal.;Weight Management: Provide education and appropriate resources to  help participant work on and attain dietary goals.;Weight Management/Obesity: Establish reasonable short term and long term weight goals.;Obesity: Provide education and appropriate resources to help participant work on and attain dietary goals.    Admit Weight 231 lb 4.8 oz (104.9 kg)    Goal Weight: Short Term 225 lb (102.1 kg)    Goal Weight: Long Term 220 lb (99.8 kg)    Expected Outcomes Long Term: Adherence to nutrition  and physical activity/exercise program aimed toward attainment of established weight goal;Short Term: Continue to assess and modify interventions until short term weight is achieved;Weight Loss: Understanding of general recommendations for a balanced deficit meal plan, which promotes 1-2 lb weight loss per week and includes a negative energy balance of [phone] kcal/d;Understanding recommendations for meals to include 15-35% energy as protein, 25-35% energy from fat, 35-60% energy from carbohydrates, less than $RemoveB'200mg'szgobDBR$  of dietary cholesterol, 20-35 gm of total fiber daily;Understanding of distribution of calorie intake throughout the day with the consumption of 4-5 meals/snacks    Diabetes Yes    Intervention Provide education about signs/symptoms and action to take for hypo/hyperglycemia.;Provide education about proper nutrition, including hydration, and aerobic/resistive exercise prescription along with prescribed medications to achieve blood glucose in normal ranges: Fasting glucose 65-99 mg/dL    Expected Outcomes Short Term: Participant verbalizes understanding of the signs/symptoms and immediate care of hyper/hypoglycemia, proper foot care and importance of medication, aerobic/resistive exercise and nutrition plan for blood glucose control.;Long Term: Attainment of HbA1C < 7%.    Hypertension Yes    Intervention Provide education on lifestyle modifcations including regular physical activity/exercise, weight management, moderate sodium restriction and increased consumption of fresh fruit, vegetables, and low fat dairy, alcohol moderation, and smoking cessation.;Monitor prescription use compliance.    Expected Outcomes Short Term: Continued assessment and intervention until BP is < 140/56mm HG in hypertensive participants. < 130/69mm HG in hypertensive participants with diabetes, heart failure or chronic kidney disease.;Long Term: Maintenance of blood pressure at goal levels.    Lipids Yes    Intervention  Provide education and support for participant on nutrition & aerobic/resistive exercise along with prescribed medications to achieve LDL '70mg'$ , HDL >$Remo'40mg'KziyB$ .    Expected Outcomes Short Term: Participant states understanding of desired cholesterol values and is compliant with medications prescribed. Participant is following exercise prescription and nutrition guidelines.;Long Term: Cholesterol controlled with medications as prescribed, with individualized exercise RX and with personalized nutrition plan. Value goals: LDL < $Rem'70mg'psMb$ , HDL > 40 mg.           Education:Diabetes - Individual verbal and written instruction to review signs/symptoms of diabetes, desired ranges of glucose level fasting, after meals and with exercise. Acknowledge that pre and post exercise glucose checks will be done for 3 sessions at entry of program.   Cardiac Rehab from 03/28/2020 in [facility] Cardiac and Pulmonary Rehab  Date 03/21/20  Educator [facility]  Instruction Review Code 1- Verbalizes Understanding      Education: Know Your Numbers and Risk Factors: -Group verbal and written instruction about important numbers in your health.  Discussion of what are risk factors and how they play a role in the disease process.  Review of Cholesterol, Blood Pressure, Diabetes, and BMI and the role they play in your overall health.   Cardiac Rehab from 03/28/2020 in [facility] Cardiac and Pulmonary Rehab  Date 03/26/20  Instruction Review Code 3- Needs Reinforcement  [need identified]      Core Components/Risk Factors/Patient Goals Review:    Core Components/Risk Factors/Patient Goals at  Discharge (Final Review):    ITP Comments:  ITP Comments    Row Name 03/21/20 0931 03/26/20 1142 03/28/20 1022 04/10/20 1423     ITP Comments Virtual Visit completed. Patient informed on EP and RD appointment and 6 Minute walk test. Patient also informed of patient health questionnaires on My Chart. Patient Verbalizes understanding. Visit diagnosis can be  found in [facility] 02/29/2020. Completed 6MWT and gym orientation. Initial ITP created and sent for review to Dr. Emily Filbert, Medical Director. First full day of exercise!  Patient was oriented to gym and equipment including functions, settings, policies, and procedures.  Patient's individual exercise prescription and treatment plan were reviewed.  All starting workloads were established based on the results of the 6 minute walk test done at initial orientation visit.  The plan for exercise progression was also introduced and progression will be customized based on patient's performance and goals. 30 day review completed. ITP sent to Dr. Emily Filbert, Medical Director of Cardiac and Pulmonary Rehab. Continue with ITP unless changes are made by physician.           Comments: 30 day review

## 2020-04-11 ENCOUNTER — Encounter: Payer: PPO | Admitting: *Deleted

## 2020-04-11 ENCOUNTER — Other Ambulatory Visit: Payer: Self-pay

## 2020-04-11 DIAGNOSIS — I214 Non-ST elevation (NSTEMI) myocardial infarction: Secondary | ICD-10-CM | POA: Diagnosis not present

## 2020-04-11 DIAGNOSIS — Z87891 Personal history of nicotine dependence: Secondary | ICD-10-CM | POA: Diagnosis not present

## 2020-04-11 DIAGNOSIS — E119 Type 2 diabetes mellitus without complications: Secondary | ICD-10-CM | POA: Diagnosis not present

## 2020-04-11 DIAGNOSIS — Z7902 Long term (current) use of antithrombotics/antiplatelets: Secondary | ICD-10-CM | POA: Diagnosis not present

## 2020-04-11 DIAGNOSIS — Z7982 Long term (current) use of aspirin: Secondary | ICD-10-CM | POA: Diagnosis not present

## 2020-04-11 DIAGNOSIS — Z79899 Other long term (current) drug therapy: Secondary | ICD-10-CM | POA: Diagnosis not present

## 2020-04-11 DIAGNOSIS — Z7984 Long term (current) use of oral hypoglycemic drugs: Secondary | ICD-10-CM | POA: Diagnosis not present

## 2020-04-11 DIAGNOSIS — I10 Essential (primary) hypertension: Secondary | ICD-10-CM | POA: Diagnosis not present

## 2020-04-11 NOTE — Progress Notes (Signed)
Daily Session Note  Patient Details  Name: Jeremy Dorsey MRN: [id number] Date of Birth: 1950/12/17 Referring Provider:     Cardiac Rehab from 03/26/2020 in [facility] Cardiac and Pulmonary Rehab  Referring Provider Charolette Forward MD      Encounter Date: 04/11/2020  Check In:  Session Check In - 04/11/20 1036      Check-In   Supervising physician immediately available to respond to emergencies See telemetry face sheet for immediately available ER MD    Location [facility]-Cardiac & Pulmonary Rehab    Staff Present Heath Lark, RN, BSN, Jacklynn Bue, MS Exercise Physiologist;Amanda Oletta Darter, [place], ACSM CEP, Exercise Physiologist    Virtual Visit No    Medication changes reported     No    Fall or balance concerns reported    No    Warm-up and Cool-down Performed on first and last piece of equipment    Resistance Training Performed Yes    VAD Patient? No    PAD/SET Patient? No      Pain Assessment   Currently in Pain? No/denies              Social History   Tobacco Use  Smoking Status Former Smoker  . Packs/day: 1.00  . Years: 30.00  . Pack years: 30.00  . Types: Cigarettes  . Quit date: 08/23/2007  . Years since quitting: 12.6  Smokeless Tobacco Never Used    Goals Met:  Independence with exercise equipment Exercise tolerated well No report of cardiac concerns or symptoms  Goals Unmet:  Not Applicable  Comments: Pt able to follow exercise prescription today without complaint.  Will continue to monitor for progression. Continues with his angina symptoms- he is aware of nitroglycerin use and calling 911 if meds do not control his symptoms Hehas called MD, is planning on changing cardiologists to have one closer to home.   Dr. Emily Filbert is Medical Director for [name].

## 2020-04-16 ENCOUNTER — Encounter: Payer: Self-pay | Admitting: *Deleted

## 2020-04-16 ENCOUNTER — Telehealth: Payer: Self-pay | Admitting: *Deleted

## 2020-04-16 DIAGNOSIS — I214 Non-ST elevation (NSTEMI) myocardial infarction: Secondary | ICD-10-CM

## 2020-04-16 NOTE — Telephone Encounter (Signed)
Jeremy Dorsey called to let us know that he has had four episodes since last attending. He has called his doctor and they encouraged him to go to the emergency room.  He was hesitant about this so we talked about urgent care options as he does need to be seen.  He will get seen somewhere and will keep [place] updated.

## 2020-04-17 DIAGNOSIS — Z6835 Body mass index (BMI) 35.0-35.9, adult: Secondary | ICD-10-CM | POA: Diagnosis not present

## 2020-04-17 DIAGNOSIS — N2581 Secondary hyperparathyroidism of renal origin: Secondary | ICD-10-CM | POA: Diagnosis not present

## 2020-04-17 DIAGNOSIS — E1122 Type 2 diabetes mellitus with diabetic chronic kidney disease: Secondary | ICD-10-CM | POA: Diagnosis not present

## 2020-04-17 DIAGNOSIS — I129 Hypertensive chronic kidney disease with stage 1 through stage 4 chronic kidney disease, or unspecified chronic kidney disease: Secondary | ICD-10-CM | POA: Diagnosis not present

## 2020-04-17 DIAGNOSIS — N3080 Other cystitis without hematuria: Secondary | ICD-10-CM | POA: Diagnosis not present

## 2020-04-17 DIAGNOSIS — E875 Hyperkalemia: Secondary | ICD-10-CM | POA: Diagnosis not present

## 2020-04-17 DIAGNOSIS — R809 Proteinuria, unspecified: Secondary | ICD-10-CM | POA: Diagnosis not present

## 2020-04-17 DIAGNOSIS — N184 Chronic kidney disease, stage 4 (severe): Secondary | ICD-10-CM | POA: Diagnosis not present

## 2020-04-23 ENCOUNTER — Encounter: Payer: Self-pay | Admitting: *Deleted

## 2020-04-23 ENCOUNTER — Other Ambulatory Visit: Payer: Self-pay

## 2020-04-23 ENCOUNTER — Ambulatory Visit: Payer: PPO | Admitting: Cardiovascular Disease

## 2020-04-23 ENCOUNTER — Encounter: Payer: Self-pay | Admitting: Cardiovascular Disease

## 2020-04-23 ENCOUNTER — Other Ambulatory Visit: Payer: Self-pay | Admitting: Cardiovascular Disease

## 2020-04-23 VITALS — BP 112/62 | HR 74 | Ht 70.0 in | Wt 236.5 lb

## 2020-04-23 DIAGNOSIS — I25110 Atherosclerotic heart disease of native coronary artery with unstable angina pectoris: Secondary | ICD-10-CM | POA: Diagnosis not present

## 2020-04-23 DIAGNOSIS — E1159 Type 2 diabetes mellitus with other circulatory complications: Secondary | ICD-10-CM | POA: Diagnosis not present

## 2020-04-23 DIAGNOSIS — N184 Chronic kidney disease, stage 4 (severe): Secondary | ICD-10-CM | POA: Diagnosis not present

## 2020-04-23 DIAGNOSIS — E782 Mixed hyperlipidemia: Secondary | ICD-10-CM | POA: Diagnosis not present

## 2020-04-23 DIAGNOSIS — I2510 Atherosclerotic heart disease of native coronary artery without angina pectoris: Secondary | ICD-10-CM | POA: Insufficient documentation

## 2020-04-23 DIAGNOSIS — I25118 Atherosclerotic heart disease of native coronary artery with other forms of angina pectoris: Secondary | ICD-10-CM | POA: Diagnosis not present

## 2020-04-23 MED ORDER — ISOSORBIDE MONONITRATE ER 30 MG PO TB24: 30.0000 mg | ORAL_TABLET | Freq: Every day | ORAL | 3 refills | Status: DC

## 2020-04-23 NOTE — Progress Notes (Signed)
Cardiology Office Note  Date:  04/23/2020   ID:  JERONIMO HELLBERG, DOB 1950/09/09, MRN [id number]  PCP:  Mar Daring, PA-C   Chief Complaint  Patient presents with  . New Patient (Initial Visit)    Self ref to estabish care for CAD; former Dr. Terrence Dupont patient. Meds reviewed by the pt. verbally. Pt. c/o chest pain that runs across his chest and down both arms with having to take NTG daily and has shortness of breath.     HPI:  Mr. Herny Scurlock is a 69 year old gentleman with past medical history of Coronary artery disease hypertension diabetes type 2 Chronic kidney disease stage IV Morbid obesity Smoker Hyperlipidemia Presenting to the hospital March 01, 2020 with arm and jaw pain, non-STEMI Catheterization deferred secondary to concern for contrast nephropathy Peak troponin 2,200 He presents to establish care in the [place] office for his coronary disease  Reports that since he left the hospital 1 month ago he has had continued unstable angina symptoms Reports taking nitroglycerin several times a day, Feels he is relatively incapacitated, unable to exert himself without symptoms  Wife who presents with him today is in agreement Symptoms have been severe for about 6 weeks and are not improving He did call Dr. Terrence Dupont, he reports their office did not call her back  EKG personally reviewed by myself on todays visit Shows normal sinus rhythm rate 74 bpm nonspecific ST abnormality  Lab work reviewed Creatinine 4.76 BUN 62 Total cholesterol 158 LDL 92 Hemoglobin A1c 7.3  Stress test March 02, 2020 results reviewed with him in detail . Potential small area of pharmacologically induced ischemia involving the apex of the left ventricle. Clinical correlation is advised. 2. No scintigraphic evidence of prior infarction. 3. Mild global hypokinesia, slightly more conspicuously involving the basilar aspect of the inferior wall of the left ventricle. 4. Ejection fraction  - 47%.  PMH:   has a past medical history of Diabetes mellitus without complication ([place]) (5027), Hypertension (2008), Lower extremity edema, Renal failure, Shoulder pain, and Urinary complication.  PSH:    Past Surgical History:  Procedure Laterality Date  . COLONOSCOPY  1990  . COLONOSCOPY  06/09/2011   Dr Bary Castilla  . CYSTOSCOPY W/ RETROGRADES Bilateral 08/01/2018   Procedure: CYSTOSCOPY WITH RETROGRADE PYELOGRAM;  Surgeon: Billey Co, MD;  Location: [facility] ORS;  Service: Urology;  Laterality: Bilateral;  . CYSTOSCOPY WITH BIOPSY N/A 08/01/2018   Procedure: CYSTOSCOPY WITH Bladder BIOPSY;  Surgeon: Billey Co, MD;  Location: [facility] ORS;  Service: Urology;  Laterality: N/A;  . CYSTOSCOPY WITH STENT PLACEMENT Right 08/01/2018   Procedure: CYSTOSCOPY WITH STENT PLACEMENT;  Surgeon: Billey Co, MD;  Location: [facility] ORS;  Service: Urology;  Laterality: Right;  . EYE SURGERY Right    laser surgery  . SHOULDER ARTHROSCOPY WITH OPEN ROTATOR CUFF REPAIR Right 08/25/2018   Procedure: SHOULDER ARTHROSCOPY WITH MINI OPEN ROTATOR CUFF REPAIR;  Surgeon: Thornton Park, MD;  Location: [facility] ORS;  Service: Orthopedics;  Laterality: Right;  . TRANSURETHRAL RESECTION OF BLADDER TUMOR N/A 08/01/2018   Procedure: TRANSURETHRAL RESECTION OF BLADDER TUMOR (TURBT);  Surgeon: Billey Co, MD;  Location: [facility] ORS;  Service: Urology;  Laterality: N/A;  . URETEROSCOPY WITH HOLMIUM LASER LITHOTRIPSY Right 08/01/2018   Procedure: URETEROSCOPY WITH HOLMIUM LASER LITHOTRIPSY;  Surgeon: Billey Co, MD;  Location: [facility] ORS;  Service: Urology;  Laterality: Right;    Current Outpatient Medications  Medication Sig Dispense Refill  . aspirin EC 81 MG  EC tablet Take 1 tablet (81 mg total) by mouth daily. Swallow whole. 30 tablet 11  . atorvastatin (LIPITOR) 80 MG tablet Take 1 tablet (80 mg total) by mouth daily. 30 tablet 3  . calcitRIOL (ROCALTROL) 0.25 MCG capsule Take 0.25-0.5 mcg by mouth See  admin instructions. Take 1 capsule (0.33mcg) by mouth all days except on Monday, Wednesday, and Friday take 2 capsules (.43mcg)  6  . clopidogrel (PLAVIX) 75 MG tablet Take 1 tablet (75 mg total) by mouth daily with breakfast. 30 tablet 3  . diphenhydramine-acetaminophen (TYLENOL PM) 25-500 MG TABS tablet Take 2 tablets by mouth at bedtime as needed (pain/sleep).    . metoprolol tartrate (LOPRESSOR) 25 MG tablet Take 1 tablet (25 mg total) by mouth 2 (two) times daily. 60 tablet 3  . nitroGLYCERIN (NITROSTAT) 0.4 MG SL tablet Place 1 tablet (0.4 mg total) under the tongue every 5 (five) minutes x 3 doses as needed for chest pain. 25 tablet 12  . sitaGLIPtin (JANUVIA) 25 MG tablet Take 1 tablet (25 mg total) by mouth daily. 30 tablet 3  . sodium bicarbonate 650 MG tablet Take 650 mg by mouth 2 (two) times daily.     . sodium zirconium cyclosilicate (LOKELMA) 10 g PACK packet Take 10 g by mouth 3 (three) times a week.    . isosorbide mononitrate (IMDUR) 30 MG 24 hr tablet Take 1 tablet (30 mg total) by mouth daily. 30 tablet 3   No current facility-administered medications for this visit.     Allergies:   Oxycodone and Hydrocodone   Social History:  The patient  reports that he quit smoking about 12 years ago. His smoking use included cigarettes. He has a 30.00 pack-year smoking history. He has never used smokeless tobacco. He reports that he does not drink alcohol and does not use drugs.   Family History:   family history includes Heart failure in his father; Psoriasis in his mother.    Review of Systems: Review of Systems  Constitutional: Negative.   HENT: Negative.   Respiratory: Negative.   Cardiovascular: Positive for chest pain.  Gastrointestinal: Negative.   Musculoskeletal: Negative.   Neurological: Negative.   Psychiatric/Behavioral: Negative.   All other systems reviewed and are negative.   PHYSICAL EXAM: VS:  BP 112/62 (BP Location: Right Arm, Patient Position: Sitting,  Cuff Size: Normal)   Pulse 74   Ht 5\' 10"  (1.778 m)   Wt 236 lb 8 oz (107.3 kg)   SpO2 98%   BMI 33.93 kg/m  , BMI Body mass index is 33.93 kg/m. GEN: Well nourished, well developed, in no acute distress obese HEENT: normal Neck: no JVD, carotid bruits, or masses Cardiac: RRR; no murmurs, rubs, or gallops,no edema  Respiratory:  clear to auscultation bilaterally, normal work of breathing GI: soft, nontender, nondistended, + BS MS: no deformity or atrophy Skin: warm and dry, no rash Neuro:  Strength and sensation are intact Psych: euthymic mood, full affect   Recent Labs: 03/03/2020: BUN 62; Creatinine, Ser 4.76; Hemoglobin 12.1; Platelets 154; Potassium 4.9; Sodium 140    Lipid Panel Lab Results  Component Value Date   CHOL 137 03/02/2020   HDL 28 (L) 03/02/2020   LDLCALC 88 03/02/2020   TRIG 106 03/02/2020      Wt Readings from Last 3 Encounters:  04/23/20 236 lb 8 oz (107.3 kg)  03/26/20 231 lb 4.8 oz (104.9 kg)  03/03/20 (!) 230 lb 9.6 oz (104.6 kg)  ASSESSMENT AND PLAN:  Problem List Items Addressed This Visit      Cardiology Problems   CAD (coronary artery disease), native coronary artery - Primary   Relevant Medications   isosorbide mononitrate (IMDUR) 30 MG 24 hr tablet   Other Relevant Orders   EKG 12-Lead   HLD (hyperlipidemia)   Relevant Medications   isosorbide mononitrate (IMDUR) 30 MG 24 hr tablet     Other   Type 2 diabetes mellitus, without long-term current use of insulin (HCC)   Chronic kidney disease (CKD), stage IV (severe) (HCC)     Unstable angina Severe symptoms since his prior hospitalization end of July, has not improved Given symptoms needs a cardiac catheterization despite underlying renal failure He is willing to proceed given he is very symptomatic Recommend he stop amlodipine, start isosorbide 30 mg daily --- Case discussed with Dr. Fletcher Anon He has been placed on schedule for catheterization in [facility] Recommended he  arrive early 6:30 AM for IV hydration given his renal failure At that time he will have rapid Covid test, BMP, CBC in preparation for catheterization later that morning -He does realize that procedure might need to be staged and he will bring his medications with him Continue aspirin Plavix  Essential hypertension As above we will hold his amlodipine and start Imdur 30 mg daily Continue metoprolol 25 twice daily  Hyperlipidemia On Lipitor 80 daily  We will follow-up with him after the catheterization   Total encounter time more than 60 minutes  Greater than 50% was spent in counseling and coordination of care with the patient    Signed, Esmond Plants, M.D., Ph.D. [place], [place]

## 2020-04-23 NOTE — Patient Instructions (Addendum)
Medication Instructions:  Please hold the amlodipine Please start imdur/isosorbide 30 mg daily   If you need a refill on your cardiac medications before your next appointment, please call your pharmacy.    Lab work: No new labs needed   If you have labs (blood work) drawn today and your tests are completely normal, you will receive your results only by:  [place] (if you have MyChart) OR  A paper copy in the mail If you have any lab test that is abnormal or we need to change your treatment, we will call you to review the results.   Testing/Procedures: See letter with instructions.    Follow-Up: At [facility], you and your health needs are our priority.  As part of our continuing mission to provide you with exceptional heart care, we have created designated Provider Care Teams.  These Care Teams include your primary Cardiologist (physician) and Advanced Practice Providers (APPs -  Physician Assistants and Nurse Practitioners) who all work together to provide you with the care you need, when you need it.   You will need a follow up appointment in 1 month   Providers on your designated Care Team:    Murray Hodgkins, NP  Christell Faith, PA-C  Marrianne Mood, PA-C  Any Other Special Instructions Will Be Listed Below (If Applicable).  COVID-19 Vaccine Information can be found at: [email or web address].uk For questions related to vaccine distribution or appointments, please email [email or web address] or call [phone].

## 2020-04-23 NOTE — H&P (View-Only) (Signed)
Cardiology Office Note  Date:  04/23/2020   ID:  Jeremy Dorsey, DOB [birth year], MRN [id number]  PCP:  Mar Daring, PA-C   Chief Complaint  Patient presents with  . New Patient (Initial Visit)    Self ref to estabish care for CAD; former Dr. Terrence Dupont patient. Meds reviewed by the pt. verbally. Pt. c/o chest pain that runs across his chest and down both arms with having to take NTG daily and has shortness of breath.     HPI:  Jeremy Dorsey is a 69 year old gentleman with past medical history of Coronary artery disease hypertension diabetes type 2 Chronic kidney disease stage IV Morbid obesity Smoker Hyperlipidemia Presenting to the hospital March 01, 2020 with arm and jaw pain, non-STEMI Catheterization deferred secondary to concern for contrast nephropathy Peak troponin 2,200 He presents to establish care in the [place] office for his coronary disease  Reports that since he left the hospital 1 month ago he has had continued unstable angina symptoms Reports taking nitroglycerin several times a day, Feels he is relatively incapacitated, unable to exert himself without symptoms  Wife who presents with him today is in agreement Symptoms have been severe for about 6 weeks and are not improving He did call Dr. Terrence Dupont, he reports their office did not call her back  EKG personally reviewed by myself on todays visit Shows normal sinus rhythm rate 74 bpm nonspecific ST abnormality  Lab work reviewed Creatinine 4.76 BUN 62 Total cholesterol 158 LDL 92 Hemoglobin A1c 7.3  Stress test March 02, 2020 results reviewed with him in detail . Potential small area of pharmacologically induced ischemia involving the apex of the left ventricle. Clinical correlation is advised. 2. No scintigraphic evidence of prior infarction. 3. Mild global hypokinesia, slightly more conspicuously involving the basilar aspect of the inferior wall of the left ventricle. 4. Ejection fraction  - 47%.  PMH:   has a past medical history of Diabetes mellitus without complication ([place]) (4854), Hypertension (2008), Lower extremity edema, Renal failure, Shoulder pain, and Urinary complication.  PSH:    Past Surgical History:  Procedure Laterality Date  . COLONOSCOPY  1990  . COLONOSCOPY  06/09/2011   Dr Bary Castilla  . CYSTOSCOPY W/ RETROGRADES Bilateral 08/01/2018   Procedure: CYSTOSCOPY WITH RETROGRADE PYELOGRAM;  Surgeon: Billey Co, MD;  Location: [facility];  Service: Urology;  Laterality: Bilateral;  . CYSTOSCOPY WITH BIOPSY N/A 08/01/2018   Procedure: CYSTOSCOPY WITH Bladder BIOPSY;  Surgeon: Billey Co, MD;  Location: [facility];  Service: Urology;  Laterality: N/A;  . CYSTOSCOPY WITH STENT PLACEMENT Right 08/01/2018   Procedure: CYSTOSCOPY WITH STENT PLACEMENT;  Surgeon: Billey Co, MD;  Location: [facility];  Service: Urology;  Laterality: Right;  . EYE SURGERY Right    laser surgery  . SHOULDER ARTHROSCOPY WITH OPEN ROTATOR CUFF REPAIR Right 08/25/2018   Procedure: SHOULDER ARTHROSCOPY WITH MINI OPEN ROTATOR CUFF REPAIR;  Surgeon: Thornton Park, MD;  Location: [facility];  Service: Orthopedics;  Laterality: Right;  . TRANSURETHRAL RESECTION OF BLADDER TUMOR N/A 08/01/2018   Procedure: TRANSURETHRAL RESECTION OF BLADDER TUMOR (TURBT);  Surgeon: Billey Co, MD;  Location: [facility];  Service: Urology;  Laterality: N/A;  . URETEROSCOPY WITH HOLMIUM LASER LITHOTRIPSY Right 08/01/2018   Procedure: URETEROSCOPY WITH HOLMIUM LASER LITHOTRIPSY;  Surgeon: Billey Co, MD;  Location: [facility];  Service: Urology;  Laterality: Right;    Current Outpatient Medications  Medication Sig Dispense Refill  . aspirin EC 81 MG  EC tablet Take 1 tablet (81 mg total) by mouth daily. Swallow whole. 30 tablet 11  . atorvastatin (LIPITOR) 80 MG tablet Take 1 tablet (80 mg total) by mouth daily. 30 tablet 3  . calcitRIOL (ROCALTROL) 0.25 MCG capsule Take 0.25-0.5 mcg by mouth See  admin instructions. Take 1 capsule (0.33mcg) by mouth all days except on Monday, Wednesday, and Friday take 2 capsules (.58mcg)  6  . clopidogrel (PLAVIX) 75 MG tablet Take 1 tablet (75 mg total) by mouth daily with breakfast. 30 tablet 3  . diphenhydramine-acetaminophen (TYLENOL PM) 25-500 MG TABS tablet Take 2 tablets by mouth at bedtime as needed (pain/sleep).    . metoprolol tartrate (LOPRESSOR) 25 MG tablet Take 1 tablet (25 mg total) by mouth 2 (two) times daily. 60 tablet 3  . nitroGLYCERIN (NITROSTAT) 0.4 MG SL tablet Place 1 tablet (0.4 mg total) under the tongue every 5 (five) minutes x 3 doses as needed for chest pain. 25 tablet 12  . sitaGLIPtin (JANUVIA) 25 MG tablet Take 1 tablet (25 mg total) by mouth daily. 30 tablet 3  . sodium bicarbonate 650 MG tablet Take 650 mg by mouth 2 (two) times daily.     . sodium zirconium cyclosilicate (LOKELMA) 10 g PACK packet Take 10 g by mouth 3 (three) times a week.    . isosorbide mononitrate (IMDUR) 30 MG 24 hr tablet Take 1 tablet (30 mg total) by mouth daily. 30 tablet 3   No current facility-administered medications for this visit.     Allergies:   Oxycodone and Hydrocodone   Social History:  The patient  reports that he quit smoking about 12 years ago. His smoking use included cigarettes. He has a 30.00 pack-year smoking history. He has never used smokeless tobacco. He reports that he does not drink alcohol and does not use drugs.   Family History:   family history includes Heart failure in his father; Psoriasis in his mother.    Review of Systems: Review of Systems  Constitutional: Negative.   HENT: Negative.   Respiratory: Negative.   Cardiovascular: Positive for chest pain.  Gastrointestinal: Negative.   Musculoskeletal: Negative.   Neurological: Negative.   Psychiatric/Behavioral: Negative.   All other systems reviewed and are negative.   PHYSICAL EXAM: VS:  BP 112/62 (BP Location: Right Arm, Patient Position: Sitting,  Cuff Size: Normal)   Pulse 74   Ht 5\' 10"  (1.778 m)   Wt 236 lb 8 oz (107.3 kg)   SpO2 98%   BMI 33.93 kg/m  , BMI Body mass index is 33.93 kg/m. GEN: Well nourished, well developed, in no acute distress obese HEENT: normal Neck: no JVD, carotid bruits, or masses Cardiac: RRR; no murmurs, rubs, or gallops,no edema  Respiratory:  clear to auscultation bilaterally, normal work of breathing GI: soft, nontender, nondistended, + BS MS: no deformity or atrophy Skin: warm and dry, no rash Neuro:  Strength and sensation are intact Psych: euthymic mood, full affect   Recent Labs: 03/03/2020: BUN 62; Creatinine, Ser 4.76; Hemoglobin 12.1; Platelets 154; Potassium 4.9; Sodium 140    Lipid Panel Lab Results  Component Value Date   CHOL 137 03/02/2020   HDL 28 (L) 03/02/2020   LDLCALC 88 03/02/2020   TRIG 106 03/02/2020      Wt Readings from Last 3 Encounters:  04/23/20 236 lb 8 oz (107.3 kg)  03/26/20 231 lb 4.8 oz (104.9 kg)  03/03/20 (!) 230 lb 9.6 oz (104.6 kg)  ASSESSMENT AND PLAN:  Problem List Items Addressed This Visit      Cardiology Problems   CAD (coronary artery disease), native coronary artery - Primary   Relevant Medications   isosorbide mononitrate (IMDUR) 30 MG 24 hr tablet   Other Relevant Orders   EKG 12-Lead   HLD (hyperlipidemia)   Relevant Medications   isosorbide mononitrate (IMDUR) 30 MG 24 hr tablet     Other   Type 2 diabetes mellitus, without long-term current use of insulin (HCC)   Chronic kidney disease (CKD), stage IV (severe) (HCC)     Unstable angina Severe symptoms since his prior hospitalization end of July, has not improved Given symptoms needs a cardiac catheterization despite underlying renal failure He is willing to proceed given he is very symptomatic Recommend he stop amlodipine, start isosorbide 30 mg daily --- Case discussed with Dr. Fletcher Anon He has been placed on schedule for catheterization in [facility] Recommended he  arrive early 6:30 AM for IV hydration given his renal failure At that time he will have rapid Covid test, BMP, CBC in preparation for catheterization later that morning -He does realize that procedure might need to be staged and he will bring his medications with him Continue aspirin Plavix  Essential hypertension As above we will hold his amlodipine and start Imdur 30 mg daily Continue metoprolol 25 twice daily  Hyperlipidemia On Lipitor 80 daily  We will follow-up with him after the catheterization   Total encounter time more than 60 minutes  Greater than 50% was spent in counseling and coordination of care with the patient    Signed, Esmond Plants, M.D., Ph.D. [place], [place]

## 2020-04-24 ENCOUNTER — Encounter (HOSPITAL_COMMUNITY): Payer: Self-pay | Admitting: Cardiovascular Disease

## 2020-04-24 ENCOUNTER — Other Ambulatory Visit: Payer: Self-pay

## 2020-04-24 ENCOUNTER — Observation Stay (HOSPITAL_COMMUNITY)
Admission: RE | Admit: 2020-04-24 | Discharge: 2020-04-25 | Disposition: A | Discharge status: Home / Self Care | Payer: PPO | Attending: Cardiovascular Disease | Admitting: Cardiovascular Disease

## 2020-04-24 ENCOUNTER — Ambulatory Visit (HOSPITAL_COMMUNITY)
Admission: RE | Disposition: A | Discharge status: Home / Self Care | Payer: Self-pay | Source: Home / Self Care | Attending: Cardiovascular Disease

## 2020-04-24 DIAGNOSIS — Z9861 Coronary angioplasty status: Secondary | ICD-10-CM

## 2020-04-24 DIAGNOSIS — E119 Type 2 diabetes mellitus without complications: Secondary | ICD-10-CM | POA: Diagnosis not present

## 2020-04-24 DIAGNOSIS — M79603 Pain in arm, unspecified: Secondary | ICD-10-CM | POA: Diagnosis present

## 2020-04-24 DIAGNOSIS — E1121 Type 2 diabetes mellitus with diabetic nephropathy: Secondary | ICD-10-CM

## 2020-04-24 DIAGNOSIS — I129 Hypertensive chronic kidney disease with stage 1 through stage 4 chronic kidney disease, or unspecified chronic kidney disease: Secondary | ICD-10-CM | POA: Insufficient documentation

## 2020-04-24 DIAGNOSIS — Z79899 Other long term (current) drug therapy: Secondary | ICD-10-CM | POA: Diagnosis not present

## 2020-04-24 DIAGNOSIS — I10 Essential (primary) hypertension: Secondary | ICD-10-CM | POA: Diagnosis present

## 2020-04-24 DIAGNOSIS — Z87891 Personal history of nicotine dependence: Secondary | ICD-10-CM | POA: Insufficient documentation

## 2020-04-24 DIAGNOSIS — E785 Hyperlipidemia, unspecified: Secondary | ICD-10-CM | POA: Diagnosis present

## 2020-04-24 DIAGNOSIS — Z20822 Contact with and (suspected) exposure to covid-19: Secondary | ICD-10-CM | POA: Diagnosis not present

## 2020-04-24 DIAGNOSIS — I2510 Atherosclerotic heart disease of native coronary artery without angina pectoris: Secondary | ICD-10-CM | POA: Diagnosis present

## 2020-04-24 DIAGNOSIS — N184 Chronic kidney disease, stage 4 (severe): Secondary | ICD-10-CM | POA: Diagnosis not present

## 2020-04-24 DIAGNOSIS — I25110 Atherosclerotic heart disease of native coronary artery with unstable angina pectoris: Secondary | ICD-10-CM

## 2020-04-24 DIAGNOSIS — Z7901 Long term (current) use of anticoagulants: Secondary | ICD-10-CM | POA: Insufficient documentation

## 2020-04-24 DIAGNOSIS — I200 Unstable angina: Principal | ICD-10-CM | POA: Diagnosis present

## 2020-04-24 DIAGNOSIS — Z7982 Long term (current) use of aspirin: Secondary | ICD-10-CM | POA: Insufficient documentation

## 2020-04-24 DIAGNOSIS — N185 Chronic kidney disease, stage 5: Secondary | ICD-10-CM | POA: Diagnosis present

## 2020-04-24 DIAGNOSIS — Z955 Presence of coronary angioplasty implant and graft: Secondary | ICD-10-CM

## 2020-04-24 HISTORY — DX: Chronic kidney disease, stage 4 (severe): N18.4

## 2020-04-24 HISTORY — DX: Atherosclerotic heart disease of native coronary artery without angina pectoris: I25.10

## 2020-04-24 HISTORY — PX: INTRAVASCULAR ULTRASOUND/IVUS: CATH118244

## 2020-04-24 HISTORY — DX: Hyperlipidemia, unspecified: E78.5

## 2020-04-24 HISTORY — PX: LEFT HEART CATH AND CORONARY ANGIOGRAPHY: CATH118249

## 2020-04-24 HISTORY — PX: CORONARY STENT INTERVENTION: CATH118234

## 2020-04-24 HISTORY — DX: Anemia, unspecified: D64.9

## 2020-04-24 LAB — POCT ACTIVATED CLOTTING TIME
Activated Clotting Time: 246 seconds
Activated Clotting Time: 560 seconds

## 2020-04-24 LAB — CBC
HCT: 33.1 % — ABNORMAL LOW (ref 39.0–52.0)
Hemoglobin: 10.8 g/dL — ABNORMAL LOW (ref 13.0–17.0)
MCH: 29.7 pg (ref 26.0–34.0)
MCHC: 32.6 g/dL (ref 30.0–36.0)
MCV: 90.9 fL (ref 80.0–100.0)
Platelets: 175 10*3/uL (ref 150–400)
RBC: 3.64 MIL/uL — ABNORMAL LOW (ref 4.22–5.81)
RDW: 11.9 % (ref 11.5–15.5)
WBC: 11.4 10*3/uL — ABNORMAL HIGH (ref 4.0–10.5)
nRBC: 0 % (ref 0.0–0.2)

## 2020-04-24 LAB — BASIC METABOLIC PANEL
Anion gap: 13 (ref 5–15)
BUN: 87 mg/dL — ABNORMAL HIGH (ref 8–23)
CO2: 16 mmol/L — ABNORMAL LOW (ref 22–32)
Calcium: 9.2 mg/dL (ref 8.9–10.3)
Chloride: 109 mmol/L (ref 98–111)
Creatinine, Ser: 5.41 mg/dL — ABNORMAL HIGH (ref 0.61–1.24)
GFR calc Af Amer: 11 mL/min — ABNORMAL LOW (ref >60)
GFR calc non Af Amer: 10 mL/min — ABNORMAL LOW (ref >60)
Glucose, Bld: 177 mg/dL — ABNORMAL HIGH (ref 70–99)
Potassium: 4.5 mmol/L (ref 3.5–5.1)
Sodium: 138 mmol/L (ref 135–145)

## 2020-04-24 LAB — GLUCOSE, CAPILLARY: Glucose-Capillary: 150 mg/dL — ABNORMAL HIGH (ref 70–99)

## 2020-04-24 LAB — SARS CORONAVIRUS 2 BY RT PCR (HOSPITAL ORDER, PERFORMED IN ~~LOC~~ HOSPITAL LAB): SARS Coronavirus 2: NEGATIVE

## 2020-04-24 SURGERY — LEFT HEART CATH AND CORONARY ANGIOGRAPHY
Anesthesia: LOCAL

## 2020-04-24 MED ORDER — FENTANYL CITRATE (PF) 100 MCG/2ML IJ SOLN
INTRAMUSCULAR | Status: DC | PRN
Start: 2020-04-24 — End: 2020-04-24
  Administered 2020-04-24: 25 ug via INTRAVENOUS
  Administered 2020-04-24: 50 ug via INTRAVENOUS

## 2020-04-24 MED ORDER — CALCITRIOL 0.25 MCG PO CAPS
0.2500 ug | ORAL_CAPSULE | ORAL | Status: DC
  Administered 2020-04-25: 0.25 ug via ORAL
  Filled 2020-04-24: qty 1

## 2020-04-24 MED ORDER — MIDAZOLAM HCL 2 MG/2ML IJ SOLN
INTRAMUSCULAR | Status: DC | PRN
  Administered 2020-04-24 (×2): 1 mg via INTRAVENOUS

## 2020-04-24 MED ORDER — ATORVASTATIN CALCIUM 80 MG PO TABS
80.0000 mg | ORAL_TABLET | Freq: Every day | ORAL | Status: DC
  Administered 2020-04-24 – 2020-04-25 (×2): 80 mg via ORAL
  Filled 2020-04-24 (×2): qty 1

## 2020-04-24 MED ORDER — VERAPAMIL HCL 2.5 MG/ML IV SOLN
INTRAVENOUS | Status: AC
  Filled 2020-04-24: qty 2

## 2020-04-24 MED ORDER — HEPARIN (PORCINE) IN NACL 1000-0.9 UT/500ML-% IV SOLN
INTRAVENOUS | Status: DC | PRN
  Administered 2020-04-24 (×2): 500 mL

## 2020-04-24 MED ORDER — SODIUM CHLORIDE 0.9 % WEIGHT BASED INFUSION
3.0000 mL/kg/h | INTRAVENOUS | Status: DC
  Administered 2020-04-24: 3 mL/kg/h via INTRAVENOUS

## 2020-04-24 MED ORDER — MIDAZOLAM HCL 2 MG/2ML IJ SOLN
INTRAMUSCULAR | Status: AC
  Filled 2020-04-24: qty 2

## 2020-04-24 MED ORDER — CALCITRIOL 0.5 MCG PO CAPS: 0.5000 ug | ORAL_CAPSULE | ORAL | Status: DC

## 2020-04-24 MED ORDER — NITROGLYCERIN 1 MG/10 ML FOR IR/CATH LAB
INTRA_ARTERIAL | Status: AC
  Filled 2020-04-24: qty 10

## 2020-04-24 MED ORDER — METOPROLOL TARTRATE 25 MG PO TABS
25.0000 mg | ORAL_TABLET | Freq: Two times a day (BID) | ORAL | Status: DC
  Administered 2020-04-24 – 2020-04-25 (×2): 25 mg via ORAL
  Filled 2020-04-24 (×2): qty 1

## 2020-04-24 MED ORDER — DIPHENHYDRAMINE-APAP (SLEEP) 25-500 MG PO TABS: 2.0000 | ORAL_TABLET | Freq: Every evening | ORAL | Status: DC | PRN

## 2020-04-24 MED ORDER — SODIUM CHLORIDE 0.9 % IV SOLN: INTRAVENOUS | Status: AC

## 2020-04-24 MED ORDER — CLOPIDOGREL BISULFATE 300 MG PO TABS
ORAL_TABLET | ORAL | Status: DC | PRN
  Administered 2020-04-24: 300 mg via ORAL

## 2020-04-24 MED ORDER — SODIUM CHLORIDE 0.9 % IV SOLN: 250.0000 mL | INTRAVENOUS | Status: DC | PRN

## 2020-04-24 MED ORDER — VERAPAMIL HCL 2.5 MG/ML IV SOLN
INTRAVENOUS | Status: DC | PRN
  Administered 2020-04-24: 10 mL via INTRA_ARTERIAL

## 2020-04-24 MED ORDER — NITROGLYCERIN 0.4 MG SL SUBL: 0.4000 mg | SUBLINGUAL_TABLET | SUBLINGUAL | Status: DC | PRN

## 2020-04-24 MED ORDER — HEPARIN (PORCINE) IN NACL 1000-0.9 UT/500ML-% IV SOLN
INTRAVENOUS | Status: AC
  Filled 2020-04-24: qty 1000

## 2020-04-24 MED ORDER — CALCITRIOL 0.25 MCG PO CAPS: 0.2500 ug | ORAL_CAPSULE | ORAL | Status: DC

## 2020-04-24 MED ORDER — SODIUM CHLORIDE 0.9% FLUSH: 3.0000 mL | Freq: Two times a day (BID) | INTRAVENOUS | Status: DC

## 2020-04-24 MED ORDER — SODIUM ZIRCONIUM CYCLOSILICATE 10 G PO PACK: 10.0000 g | PACK | ORAL | Status: DC

## 2020-04-24 MED ORDER — SODIUM CHLORIDE 0.9% FLUSH: 3.0000 mL | INTRAVENOUS | Status: DC | PRN

## 2020-04-24 MED ORDER — ASPIRIN EC 81 MG PO TBEC
81.0000 mg | DELAYED_RELEASE_TABLET | Freq: Every day | ORAL | Status: DC
  Administered 2020-04-25: 81 mg via ORAL
  Filled 2020-04-24: qty 1

## 2020-04-24 MED ORDER — FENTANYL CITRATE (PF) 100 MCG/2ML IJ SOLN
INTRAMUSCULAR | Status: AC
  Filled 2020-04-24: qty 2

## 2020-04-24 MED ORDER — HEPARIN SODIUM (PORCINE) 1000 UNIT/ML IJ SOLN
INTRAMUSCULAR | Status: DC | PRN
  Administered 2020-04-24: 2000 [IU] via INTRAVENOUS
  Administered 2020-04-24 (×2): 5000 [IU] via INTRAVENOUS

## 2020-04-24 MED ORDER — ACETAMINOPHEN 500 MG PO TABS: 1000.0000 mg | ORAL_TABLET | Freq: Every evening | ORAL | Status: DC | PRN

## 2020-04-24 MED ORDER — ACETAMINOPHEN 325 MG PO TABS: 650.0000 mg | ORAL_TABLET | ORAL | Status: DC | PRN

## 2020-04-24 MED ORDER — LIDOCAINE HCL (PF) 1 % IJ SOLN
INTRAMUSCULAR | Status: AC
  Filled 2020-04-24: qty 30

## 2020-04-24 MED ORDER — CLOPIDOGREL BISULFATE 75 MG PO TABS
75.0000 mg | ORAL_TABLET | Freq: Every day | ORAL | Status: DC
  Administered 2020-04-25: 75 mg via ORAL
  Filled 2020-04-24: qty 1

## 2020-04-24 MED ORDER — HEPARIN SODIUM (PORCINE) 1000 UNIT/ML IJ SOLN
INTRAMUSCULAR | Status: AC
  Filled 2020-04-24: qty 1

## 2020-04-24 MED ORDER — CLOPIDOGREL BISULFATE 300 MG PO TABS
ORAL_TABLET | ORAL | Status: AC
  Filled 2020-04-24: qty 1

## 2020-04-24 MED ORDER — IOHEXOL 350 MG/ML SOLN
INTRAVENOUS | Status: DC | PRN
  Administered 2020-04-24: 80 mL

## 2020-04-24 MED ORDER — LIDOCAINE HCL (PF) 1 % IJ SOLN
INTRAMUSCULAR | Status: DC | PRN
  Administered 2020-04-24: 2 mL

## 2020-04-24 MED ORDER — ASPIRIN 81 MG PO CHEW: 81.0000 mg | CHEWABLE_TABLET | ORAL | Status: DC

## 2020-04-24 MED ORDER — SODIUM BICARBONATE 650 MG PO TABS
650.0000 mg | ORAL_TABLET | Freq: Two times a day (BID) | ORAL | Status: DC
  Administered 2020-04-24 – 2020-04-25 (×2): 650 mg via ORAL
  Filled 2020-04-24 (×2): qty 1

## 2020-04-24 MED ORDER — ISOSORBIDE MONONITRATE ER 30 MG PO TB24
30.0000 mg | ORAL_TABLET | Freq: Every day | ORAL | Status: DC
  Administered 2020-04-24 – 2020-04-25 (×2): 30 mg via ORAL
  Filled 2020-04-24 (×2): qty 1

## 2020-04-24 MED ORDER — LINAGLIPTIN 5 MG PO TABS
5.0000 mg | ORAL_TABLET | Freq: Every day | ORAL | Status: DC
  Administered 2020-04-25: 5 mg via ORAL
  Filled 2020-04-24: qty 1

## 2020-04-24 MED ORDER — SODIUM CHLORIDE 0.9 % WEIGHT BASED INFUSION
1.0000 mL/kg/h | INTRAVENOUS | Status: DC
  Administered 2020-04-24: 1 mL/kg/h via INTRAVENOUS

## 2020-04-24 MED ORDER — ONDANSETRON HCL 4 MG/2ML IJ SOLN: 4.0000 mg | Freq: Four times a day (QID) | INTRAMUSCULAR | Status: DC | PRN

## 2020-04-24 MED ORDER — DIPHENHYDRAMINE HCL 25 MG PO CAPS: 50.0000 mg | ORAL_CAPSULE | Freq: Every evening | ORAL | Status: DC | PRN

## 2020-04-24 SURGICAL SUPPLY — 21 items
BALLN SAPPHIRE ~~LOC~~ 2.5X12 (BALLOONS) ×2 IMPLANT
BALLN SAPPHIRE ~~LOC~~ 3.25X10 (BALLOONS) ×1 IMPLANT
BALLN ~~LOC~~ SAPPHIRE 4.5X10 (BALLOONS) ×2
BALLOON ~~LOC~~ SAPPHIRE 4.5X10 (BALLOONS) IMPLANT
CATH INFINITI 5 FR JL3.5 (CATHETERS) ×1 IMPLANT
CATH INFINITI 5FR JK (CATHETERS) ×1 IMPLANT
CATH INFINITI JR4 5F (CATHETERS) ×1 IMPLANT
CATH OPTICROSS HD (CATHETERS) ×1 IMPLANT
CATH VISTA GUIDE 6FR XBLAD3.5 (CATHETERS) ×1 IMPLANT
DEVICE RAD COMP TR BAND LRG (VASCULAR PRODUCTS) ×1 IMPLANT
GLIDESHEATH SLEND SS 6F .021 (SHEATH) ×1 IMPLANT
GUIDEWIRE INQWIRE 1.5J.035X260 (WIRE) IMPLANT
INQWIRE 1.5J .035X260CM (WIRE) ×2
KIT HEART LEFT (KITS) ×2 IMPLANT
PACK CARDIAC CATHETERIZATION (CUSTOM PROCEDURE TRAY) ×2 IMPLANT
SLED PULL BACK IVUS (MISCELLANEOUS) ×1 IMPLANT
STENT RESOLUTE ONYX 3.0X15 (Permanent Stent) ×1 IMPLANT
STENT RESOLUTE ONYX 4.0X15 (Permanent Stent) ×1 IMPLANT
TRANSDUCER W/STOPCOCK (MISCELLANEOUS) ×2 IMPLANT
TUBING CIL FLEX 10 FLL-RA (TUBING) ×2 IMPLANT
WIRE RUNTHROUGH .014X180CM (WIRE) ×1 IMPLANT

## 2020-04-24 NOTE — Interval H&P Note (Signed)
Cath Lab Visit (complete for each Cath Lab visit)  Clinical Evaluation Leading to the Procedure:   ACS: Yes.    Non-ACS:    Anginal Classification: CCS IV  Anti-ischemic medical therapy: Maximal Therapy (2 or more classes of medications)  Non-Invasive Test Results: No non-invasive testing performed  Prior CABG: No previous CABG      History and Physical Interval Note:  04/24/2020 12:47 PM  Jeremy Dorsey  has presented today for surgery, with the diagnosis of unstable angina.  The various methods of treatment have been discussed with the patient and family. After consideration of risks, benefits and other options for treatment, the patient has consented to  Procedure(s): LEFT HEART CATH AND CORONARY ANGIOGRAPHY (N/A) as a surgical intervention.  The patient's history has been reviewed, patient examined, no change in status, stable for surgery.  I have reviewed the patient's chart and labs.  Questions were answered to the patient's satisfaction.     Kathlyn [place]

## 2020-04-25 ENCOUNTER — Encounter (HOSPITAL_COMMUNITY): Payer: Self-pay | Admitting: Cardiovascular Disease

## 2020-04-25 ENCOUNTER — Other Ambulatory Visit: Payer: Self-pay

## 2020-04-25 ENCOUNTER — Encounter: Payer: Self-pay | Admitting: *Deleted

## 2020-04-25 ENCOUNTER — Telehealth: Payer: Self-pay

## 2020-04-25 DIAGNOSIS — I200 Unstable angina: Secondary | ICD-10-CM | POA: Diagnosis not present

## 2020-04-25 DIAGNOSIS — I25110 Atherosclerotic heart disease of native coronary artery with unstable angina pectoris: Secondary | ICD-10-CM | POA: Diagnosis not present

## 2020-04-25 DIAGNOSIS — I10 Essential (primary) hypertension: Secondary | ICD-10-CM | POA: Diagnosis not present

## 2020-04-25 DIAGNOSIS — E782 Mixed hyperlipidemia: Secondary | ICD-10-CM

## 2020-04-25 DIAGNOSIS — N184 Chronic kidney disease, stage 4 (severe): Secondary | ICD-10-CM | POA: Diagnosis not present

## 2020-04-25 DIAGNOSIS — Z20822 Contact with and (suspected) exposure to covid-19: Secondary | ICD-10-CM | POA: Diagnosis not present

## 2020-04-25 DIAGNOSIS — I214 Non-ST elevation (NSTEMI) myocardial infarction: Secondary | ICD-10-CM

## 2020-04-25 DIAGNOSIS — Z87891 Personal history of nicotine dependence: Secondary | ICD-10-CM | POA: Diagnosis not present

## 2020-04-25 DIAGNOSIS — E119 Type 2 diabetes mellitus without complications: Secondary | ICD-10-CM | POA: Diagnosis not present

## 2020-04-25 DIAGNOSIS — I129 Hypertensive chronic kidney disease with stage 1 through stage 4 chronic kidney disease, or unspecified chronic kidney disease: Secondary | ICD-10-CM | POA: Diagnosis not present

## 2020-04-25 DIAGNOSIS — N185 Chronic kidney disease, stage 5: Secondary | ICD-10-CM | POA: Diagnosis not present

## 2020-04-25 DIAGNOSIS — Z7982 Long term (current) use of aspirin: Secondary | ICD-10-CM | POA: Diagnosis not present

## 2020-04-25 DIAGNOSIS — Z7901 Long term (current) use of anticoagulants: Secondary | ICD-10-CM | POA: Diagnosis not present

## 2020-04-25 DIAGNOSIS — Z79899 Other long term (current) drug therapy: Secondary | ICD-10-CM | POA: Diagnosis not present

## 2020-04-25 LAB — CBC
HCT: 31 % — ABNORMAL LOW (ref 39.0–52.0)
Hemoglobin: 10.2 g/dL — ABNORMAL LOW (ref 13.0–17.0)
MCH: 30.6 pg (ref 26.0–34.0)
MCHC: 32.9 g/dL (ref 30.0–36.0)
MCV: 93.1 fL (ref 80.0–100.0)
Platelets: 152 10*3/uL (ref 150–400)
RBC: 3.33 MIL/uL — ABNORMAL LOW (ref 4.22–5.81)
RDW: 11.9 % (ref 11.5–15.5)
WBC: 9.2 10*3/uL (ref 4.0–10.5)
nRBC: 0 % (ref 0.0–0.2)

## 2020-04-25 LAB — BASIC METABOLIC PANEL
Anion gap: 9 (ref 5–15)
BUN: 82 mg/dL — ABNORMAL HIGH (ref 8–23)
CO2: 18 mmol/L — ABNORMAL LOW (ref 22–32)
Calcium: 8.7 mg/dL — ABNORMAL LOW (ref 8.9–10.3)
Chloride: 111 mmol/L (ref 98–111)
Creatinine, Ser: 5.01 mg/dL — ABNORMAL HIGH (ref 0.61–1.24)
GFR calc Af Amer: 13 mL/min — ABNORMAL LOW (ref >60)
GFR calc non Af Amer: 11 mL/min — ABNORMAL LOW (ref >60)
Glucose, Bld: 142 mg/dL — ABNORMAL HIGH (ref 70–99)
Potassium: 5.1 mmol/L (ref 3.5–5.1)
Sodium: 138 mmol/L (ref 135–145)

## 2020-04-25 MED FILL — Nitroglycerin IV Soln 100 MCG/ML in D5W: INTRA_ARTERIAL | Qty: 10 | Status: AC

## 2020-04-25 NOTE — Progress Notes (Signed)
CARDIAC REHAB PHASE I   PRE:  Rate/Rhythm: SR/77  BP:  Sitting: 137/83        MODE:  Ambulation: 400 ft   POST:  Rate/Rhythm: SR/90  BP:  Sitting:153/78        Pt ambulated in hallway. No complaints of dizziness, SOB, or pain. Pt back to bedside with spouse at side. Reviewed post cath care: wound care, activity limitations, and precautions. Stressed use of all meds, especially ASA/Plavix. Pt has stent card. Reviewed card and encouraged to make copy and carry with him. Reviewed low Na diet, heart healthy diet, and provided handouts. Encouraged daily walking and given guidelines. Pt was attending CRP2 @ [facility] and had been referred again to this program. Pt and his spouse verbalized understanding of the education provided.   8:20 - 9:33  Lesly Rubenstein, MS, ACSM EP-C, [facility] 04/25/2020  9:26 AM

## 2020-04-25 NOTE — Discharge Summary (Signed)
Discharge Summary    Patient ID: Jeremy Dorsey MRN: [id number]; DOB: 02-Dec-1950  Admit date: 04/24/2020 Discharge date: 04/25/2020  Primary Care Provider: Mar Daring, PA-C  Primary Cardiologist: [name], MD  Primary Electrophysiologist:  None   Discharge Diagnoses    Principal Problem:   Unstable angina [facility]) Active Problems:   CAD (coronary artery disease), native coronary artery   CKD (chronic kidney disease), stage V ([place])   HLD (hyperlipidemia)   Essential hypertension   Type 2 diabetes mellitus, without long-term current use of insulin ([place])   Diagnostic Studies/Procedures    2D echo 03/01/20 1. Left ventricular ejection fraction, by estimation, is 50 to 55%. The  left ventricle has low normal function. The left ventricle demonstrates  regional wall motion abnormalities (see scoring diagram/findings for  description). Left ventricular diastolic  parameters are consistent with Grade I diastolic dysfunction (impaired  relaxation). There is moderate hypokinesis of the left ventricular, basal  inferior wall.  2. Right ventricular systolic function is normal. The right ventricular  size is normal.  3. The mitral valve is normal in structure. No evidence of mitral valve  regurgitation.  4. The aortic valve is normal in structure. Aortic valve regurgitation is  not visualized. Mild aortic valve sclerosis is present, with no evidence  of aortic valve stenosis.  5. The inferior vena cava is dilated in size with >50% respiratory  variability, suggesting right atrial pressure of 8 mmHg.   LHC 04/24/20  Ost RCA to Prox RCA lesion is 30% stenosed.  Ost LAD to Prox LAD lesion is 40% stenosed.  Prox LAD to Mid LAD lesion is 99% stenosed.  Post intervention, there is a 0% residual stenosis.  A drug-eluting stent was successfully placed using a STENT RESOLUTE ONYX 4.0X15.  Mid LAD lesion is 90% stenosed.  Post intervention, there is a 0% residual  stenosis.  A drug-eluting stent was successfully placed using a STENT RESOLUTE ONYX 3.0X15.  Mid LAD to Dist LAD lesion is 60% stenosed.  Dist LAD lesion is 30% stenosed.  1. Left dominant coronary arteries with severe one-vessel coronary artery disease involving proximal LAD with 99% stenosis and mid LAD with 90% stenosis. There is moderate LAD disease in the mid to distal segment that was left to be treated medically. 2. Left ventricular angiography was not performed due to chronic kidney disease. LVEDP was moderately elevated at 25 mmHg. 3. Successful IVUS guided PCI and drug-eluting stent placement to the mid and proximal LAD.  Recommendations: Dual antiplatelet therapy for at least 1 year. Aggressive treatment of risk factors. Given the patient's advanced chronic kidney disease, I am going to observe him overnight with hydration. Recheck renal function in the morning. A total of 80 mL of contrast was used for the procedure.  _____________   History of Present Illness     Jeremy Dorsey is a 69 y.o. male with recent NSTEMI, HTN, DM2, CKD stage IV-V, morbid obesity, tobacco abuse, HLD who presented to [facility] for planned cath. He was recently admitted 02/2020 with NSTEMI, troponin 2200.  Nuclear stress test 03/02/20 was abnormal. Cath was deferred due to risk of contrast nephropathy. The patient had trouble reaching that cardiologist's office so established care with [facility] on 04/23/20. At time of his OV he was reporting continued unstable anginal symptoms despite medical therapy therefore cardiac cath was recommended.  Hospital Course     1.  Unstable angina/CAD - s/p IVUS-guided DESx2 to prox & mid LAD, residual disease  treated medically - continue ASA, Plavix, metoprolol, statin - OK to discontinue Imdur per d/w Dr. Debara Pickett given PCI  2. Moderately elevated LVEDP - clinically well appearing, hold off diuretic given renal dysfunction (not on this prior to admission -  stopped in 02/2020)  3. CKD stage V - pre cath Cr 5.41, post-cath stable at 5.01, will arrange f/u BMET/CBC on Monday - continue Lokelma 3x/week - wife needed help clarifying calcitriol dose on rx so contacted [facility] for clarification which was continued below - recommend close outpatient follow-up with nephrologist, patient states he has appointment  4. Essential HTN - BP controlled  5. HLD - continue atorvastatin 80mg  which was started 02/2020 - consider f/u labs in the office setting to reassess for goal LDL <70  6. Anemia - pre-cath Hgb 10.8, post-cath 10.2 without significant change although was in 12-13 range earlier this summer - recommend f/u CBC when we get f/u BMET on Monday 9/27 - recommended OP f/u primary care/nephrology  7. DM - OP f/u PCP  We will obtain a f/u BMET/CBC at [facility] on Monday 04/29/20. I spoke with nurse Iva over at our [facility] location who will be assisting with the lab orders and he was instructed to go to the medical mall to have these drawn. We will move up his previously arranged f/u (5 weeks out) to 1 week away so that he can be reassessed for whether he is ready to return to cardiac rehab. He had previously been referred from last hospitalization but would like him to hold off returning until seen back in follow-up. Per discussion with cardiac rehab at [facility], if the APP seeing him in follow-up could just make a note that he is ready to begin back, they will follow behind the scenes to get him set back up.  Dr. Debara Pickett has seen and examined the patient today and feels he is stable for discharge. 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  Did the patient have an acute coronary syndrome (MI, NSTEMI, STEMI, etc) this admission?:  No                               Did the patient have a percutaneous coronary intervention (stent /  angioplasty)?:  Yes.    Cath/PCI Registry Performance & Quality Measures: 1. Aspirin prescribed? - Yes 2. ADP Receptor Inhibitor (Plavix/Clopidogrel, Brilinta/Ticagrelor or Effient/Prasugrel) prescribed (includes medically managed patients)? - Yes 3. High Intensity Statin (Lipitor 40-80mg  or Crestor 20-40mg ) prescribed? - Yes 4. For EF <40%, was ACEI/ARB prescribed? - Not Applicable (EF >/= 41%) -also severe CKD 5. For EF <40%, Aldosterone Antagonist (Spironolactone or Eplerenone) prescribed? - Not Applicable (EF >/= 66%) - also severe CKD 6. Cardiac Rehab Phase II ordered? - Yes   _____________  Discharge Vitals Blood pressure 119/68, pulse 73, temperature 98 F (36.7 C), temperature source Oral, resp. rate 18, height 5\' 10"  (1.778 m), weight 106.1 kg, SpO2 97 %.  Filed Weights   04/24/20 0829 04/25/20 0531  Weight: 105.2 kg 106.1 kg    Labs & Radiologic Studies    CBC Recent Labs    04/24/20 0731 04/25/20 0740  WBC 11.4* 9.2  HGB 10.8* 10.2*  HCT 33.1* 31.0*  MCV 90.9 93.1  PLT 175 063   Basic Metabolic Panel Recent Labs  04/24/20 0731 04/25/20 0740  NA 138 138  K 4.5 5.1  CL 109 111  CO2 16* 18*  GLUCOSE 177* 142*  BUN 87* 82*  CREATININE 5.41* 5.01*  CALCIUM 9.2 8.7*   _____________  CARDIAC CATHETERIZATION  Result Date: 04/24/2020  Ost RCA to Prox RCA lesion is 30% stenosed.  Ost LAD to Prox LAD lesion is 40% stenosed.  Prox LAD to Mid LAD lesion is 99% stenosed.  Post intervention, there is a 0% residual stenosis.  A drug-eluting stent was successfully placed using a STENT RESOLUTE ONYX 4.0X15.  Mid LAD lesion is 90% stenosed.  Post intervention, there is a 0% residual stenosis.  A drug-eluting stent was successfully placed using a STENT RESOLUTE ONYX 3.0X15.  Mid LAD to Dist LAD lesion is 60% stenosed.  Dist LAD lesion is 30% stenosed.  1.  Left dominant coronary arteries with severe one-vessel coronary artery disease involving proximal LAD with  99% stenosis and mid LAD with 90% stenosis.  There is moderate LAD disease in the mid to distal segment that was left to be treated medically. 2.  Left ventricular angiography was not performed due to chronic kidney disease.  LVEDP was moderately elevated at 25 mmHg. 3.  Successful IVUS guided PCI and drug-eluting stent placement to the mid and proximal LAD. Recommendations: Dual antiplatelet therapy for at least 1 year. Aggressive treatment of risk factors. Given the patient's advanced chronic kidney disease, I am going to observe him overnight with hydration.  Recheck renal function in the morning.  A total of 80 mL of contrast was used for the procedure.   Disposition   Pt is being discharged home today in good condition.  Follow-up Plans & Appointments     Follow-up Information    [facility] Follow up.   Why: Go to [facility] on Monday 04/29/20 to have labwork drawn (CBC/BMET) in the [place]. You can park with [place] and need to check in at registration. Please come between 7am-11am to have this done. You do not need to be fasting. Contact information: [place] [address]       Primary Care / Kidney Doctor Follow up.   Why: Please follow up with your primary care for your diabetes and kidney doctor to monitor your kidney function further       Loel Dubonnet, NP Follow up.   Specialty: Cardiology Why: [place] location - We have moved your follow-up appointment to Thursday May 02, 2020 10:00 AM (Arrive by 9:45 AM). Urban Gibson is one of the nurse practitioners that works closely with our team. Contact information: [place] [address] [phone]              Discharge Instructions    Amb Referral to Cardiac Rehabilitation   Complete by: As directed    Diagnosis:  Coronary Stents PTCA     After initial evaluation and assessments  completed: Virtual Based Care may be provided alone or in conjunction with Phase 2 Cardiac Rehab based on patient barriers.: Yes   Diet - low sodium heart healthy   Complete by: As directed    Renal diet   Discharge instructions   Complete by: As directed    Your Tylenol PM (diphenhydramine/acetaminophen) was removed from your list since you indicated you weren't taking this any longer.  You were taken off isosorbide during this admission since this was more for heart  symptoms - we are hopeful you will not need it any longer since you had stents placed.   Increase activity slowly   Complete by: As directed    No driving for 2 days. No lifting over 5 lbs for 1 week. No sexual activity for 1 week. You may not return to work until cleared by your cardiologist. If you have any paperwork you need filled out please drop it off at the office. Keep procedure site clean & dry. If you notice increased pain, swelling, bleeding or pus, call/return!  You may shower, but no soaking baths/hot tubs/pools for 1 week.  We would recommend you hold off on cardiac rehab until you are seen back in the office for follow-up. We have contacted their department to let them know. When you see the nurse practitioner back in follow-up in a week, she will let you know if you are ready to return.      Discharge Medications   Allergies as of 04/25/2020      Reactions   Oxycodone Nausea And Vomiting   Hydrocodone Nausea And Vomiting      Medication List    STOP taking these medications   diphenhydramine-acetaminophen 25-500 MG Tabs tablet Commonly known as: TYLENOL PM   isosorbide mononitrate 30 MG 24 hr tablet Commonly known as: IMDUR     TAKE these medications   acetaminophen 500 MG tablet Commonly known as: TYLENOL Take 1,000 mg by mouth every 6 (six) hours as needed for mild pain.   aspirin 81 MG EC tablet Take 1 tablet (81 mg total) by mouth daily. Swallow whole.   atorvastatin 80 MG tablet Commonly  known as: LIPITOR Take 1 tablet (80 mg total) by mouth daily.   calcitRIOL 0.25 MCG capsule Commonly known as: ROCALTROL Take 0.5 mcg by mouth daily. (Prescription instructions say to take 2 capsules by mouth daily)   clopidogrel 75 MG tablet Commonly known as: PLAVIX Take 1 tablet (75 mg total) by mouth daily with breakfast.   Lokelma 10 g Pack packet Generic drug: sodium zirconium cyclosilicate Take 10 g by mouth 3 (three) times a week.   loratadine 10 MG tablet Commonly known as: CLARITIN Take 10 mg by mouth daily.   metoprolol tartrate 25 MG tablet Commonly known as: LOPRESSOR Take 1 tablet (25 mg total) by mouth 2 (two) times daily.   nitroGLYCERIN 0.4 MG SL tablet Commonly known as: NITROSTAT Place 1 tablet (0.4 mg total) under the tongue every 5 (five) minutes x 3 doses as needed for chest pain.   sitaGLIPtin 25 MG tablet Commonly known as: Januvia Take 1 tablet (25 mg total) by mouth daily.   sodium bicarbonate 650 MG tablet Take 650 mg by mouth 2 (two) times daily.          Outstanding Labs/Studies   BMET/CBC on Monday 04/29/20  Duration of Discharge Encounter   Greater than 30 minutes including physician time.  Signed, Charlie Pitter, PA-C 04/25/2020, 11:45 AM

## 2020-04-25 NOTE — Progress Notes (Signed)
Cardiac Individual Treatment Plan  Patient Details  Name: Jeremy Dorsey MRN: [id number] Date of Birth: 1950-09-06 Referring Provider:     Cardiac Rehab from 03/26/2020 in [facility] Cardiac and Pulmonary Rehab  Referring Provider Charolette Forward MD      Initial Encounter Date:    Cardiac Rehab from 03/26/2020 in [facility] Cardiac and Pulmonary Rehab  Date 03/26/20      Visit Diagnosis: NSTEMI (non-ST elevated myocardial infarction) [facility])  Patient's Home Medications on Admission:  Current Outpatient Medications:  .  acetaminophen (TYLENOL) 500 MG tablet, Take 1,000 mg by mouth every 6 (six) hours as needed for mild pain., Disp: , Rfl:  .  aspirin EC 81 MG EC tablet, Take 1 tablet (81 mg total) by mouth daily. Swallow whole., Disp: 30 tablet, Rfl: 11 .  atorvastatin (LIPITOR) 80 MG tablet, Take 1 tablet (80 mg total) by mouth daily., Disp: 30 tablet, Rfl: 3 .  calcitRIOL (ROCALTROL) 0.25 MCG capsule, Take 0.5 mcg by mouth daily. (Prescription instructions say to take 2 capsules by mouth daily), Disp: , Rfl:  .  clopidogrel (PLAVIX) 75 MG tablet, Take 1 tablet (75 mg total) by mouth daily with breakfast., Disp: 30 tablet, Rfl: 3 .  loratadine (CLARITIN) 10 MG tablet, Take 10 mg by mouth daily., Disp: , Rfl:  .  metoprolol tartrate (LOPRESSOR) 25 MG tablet, Take 1 tablet (25 mg total) by mouth 2 (two) times daily., Disp: 60 tablet, Rfl: 3 .  nitroGLYCERIN (NITROSTAT) 0.4 MG SL tablet, Place 1 tablet (0.4 mg total) under the tongue every 5 (five) minutes x 3 doses as needed for chest pain., Disp: 25 tablet, Rfl: 12 .  sitaGLIPtin (JANUVIA) 25 MG tablet, Take 1 tablet (25 mg total) by mouth daily., Disp: 30 tablet, Rfl: 3 .  sodium bicarbonate 650 MG tablet, Take 650 mg by mouth 2 (two) times daily. , Disp: , Rfl:  .  sodium zirconium cyclosilicate (LOKELMA) 10 g PACK packet, Take 10 g by mouth 3 (three) times a week., Disp: , Rfl:  No current facility-administered medications for this  visit.  Facility-Administered Medications Ordered in Other Visits:  .  0.9 %  sodium chloride infusion, 250 mL, Intravenous, PRN, Fletcher Anon, Muhammad A, MD .  acetaminophen (TYLENOL) tablet 1,000 mg, 1,000 mg, Oral, QHS PRN **AND** diphenhydrAMINE (BENADRYL) capsule 50 mg, 50 mg, Oral, QHS PRN, Fletcher Anon, Muhammad A, MD .  acetaminophen (TYLENOL) tablet 650 mg, 650 mg, Oral, Q4H PRN, Arida, Muhammad A, MD .  aspirin EC tablet 81 mg, 81 mg, Oral, Daily, Kathlyn Sacramento A, MD, 81 mg at 04/25/20 1005 .  atorvastatin (LIPITOR) tablet 80 mg, 80 mg, Oral, Daily, Kathlyn Sacramento A, MD, 80 mg at 04/25/20 1005 .  calcitRIOL (ROCALTROL) capsule 0.25 mcg, 0.25 mcg, Oral, Once per day on Sun Tue Thu Sat, 0.25 mcg at 04/25/20 1007 **AND** [START ON 04/26/2020] calcitRIOL (ROCALTROL) capsule 0.5 mcg, 0.5 mcg, Oral, Once per day on Mon Wed Fri, Arida, Muhammad A, MD .  clopidogrel (PLAVIX) tablet 75 mg, 75 mg, Oral, Q breakfast, Kathlyn Sacramento A, MD, 75 mg at 04/25/20 1005 .  linagliptin (TRADJENTA) tablet 5 mg, 5 mg, Oral, Daily, Arida, Muhammad A, MD, 5 mg at 04/25/20 1005 .  metoprolol tartrate (LOPRESSOR) tablet 25 mg, 25 mg, Oral, BID, Arida, Muhammad A, MD, 25 mg at 04/25/20 1005 .  nitroGLYCERIN (NITROSTAT) SL tablet 0.4 mg, 0.4 mg, Sublingual, Q5 Min x 3 PRN, Fletcher Anon, Muhammad A, MD .  ondansetron (ZOFRAN) injection 4 mg,  4 mg, Intravenous, Q6H PRN, Arida, Muhammad A, MD .  sodium bicarbonate tablet 650 mg, 650 mg, Oral, BID, Fletcher Anon, Muhammad A, MD, 650 mg at 04/25/20 1004 .  sodium chloride flush (NS) 0.9 % injection 3 mL, 3 mL, Intravenous, Q12H, Arida, Muhammad A, MD .  sodium chloride flush (NS) 0.9 % injection 3 mL, 3 mL, Intravenous, PRN, Wellington Hampshire, MD .  Derrill Memo ON 04/26/2020] sodium zirconium cyclosilicate (LOKELMA) packet 10 g, 10 g, Oral, Once per day on Mon Wed Fri, Arida, Muhammad A, MD  Past Medical History: Past Medical History:  Diagnosis Date  . Anemia   . CKD (chronic kidney disease),  stage IV ([place])   . Coronary artery disease    a. s/p IVUS-guided DESx2 to prox & mid LAD, residual disease treated medically. EF 50-55% by recent echo 02/2020.  . 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 Hyperlipidemia LDL goal <70   . Hypertension 2008  . Lower extremity edema   . Shoulder pain    Right  . Urinary complication     Tobacco Use: Social History   Tobacco Use  Smoking Status Former Smoker  . Packs/day: 1.00  . Years: 30.00  . Pack years: 30.00  . Types: Cigarettes  . Quit date: 08/23/2007  . Years since quitting: 12.6  Smokeless Tobacco Never Used    Labs: Recent Chemical engineer    Labs for ITP Cardiac and Pulmonary Rehab Latest Ref Rng & Units 08/11/2016 02/22/2017 06/10/2018 03/01/2020 03/02/2020   Cholestrol 0 - 200 mg/dL - - - 158 137   LDLCALC 0 - 99 mg/dL - - - 92 88   HDL >40 mg/dL - - - 31(L) 28(L)   Trlycerides <150 mg/dL - - - 173(H) 106   Hemoglobin A1c 4.8 - 5.6 % 8.8+% 9.1 8.0(A) 7.3(H) -       Exercise Target Goals: Exercise Program Goal: Individual exercise prescription set using results from initial 6 min walk test and THRR while considering  patient's activity barriers and safety.   Exercise Prescription Goal: Initial exercise prescription builds to 30-45 minutes a day of aerobic activity, 2-3 days per week.  Home exercise guidelines will be given to patient during program as part of exercise prescription that the participant will acknowledge.   Education: Aerobic Exercise & Resistance Training: - Gives group verbal and written instruction on the various components of exercise. Focuses on aerobic and resistive training programs and the benefits of this training and how to safely progress through these programs..   Cardiac Rehab from 04/11/2020 in [facility] Cardiac and Pulmonary Rehab  Date 03/28/20  [resistance]  Educator [facility]  Instruction Review Code 1- Verbalizes Understanding      Education: Exercise & Equipment Safety: -  Individual verbal instruction and demonstration of equipment use and safety with use of the equipment.   Cardiac Rehab from 04/11/2020 in [facility] Cardiac and Pulmonary Rehab  Date 03/21/20  Educator [facility]  Instruction Review Code 1- Verbalizes Understanding      Education: Exercise Physiology & General Exercise Guidelines: - Group verbal and written instruction with models to review the exercise physiology of the cardiovascular system and associated critical values. Provides general exercise guidelines with specific guidelines to those with heart or lung disease.    Cardiac Rehab from 04/11/2020 in [facility] Cardiac and Pulmonary Rehab  Date 03/26/20  Instruction Review Code 3- Needs Reinforcement  [need identified]      Education: Flexibility, Balance, Mind/Body Relaxation: Provides group verbal/written  instruction on the benefits of flexibility and balance training, including mind/body exercise modes such as yoga, pilates and tai chi.  Demonstration and skill practice provided.   Activity Barriers & Risk Stratification:  Activity Barriers & Cardiac Risk Stratification - 03/26/20 1148      Activity Barriers & Cardiac Risk Stratification   Activity Barriers Joint Problems;Deconditioning;Muscular Weakness;Shortness of Breath;Balance Concerns;History of Falls   r shoulder   Cardiac Risk Stratification High           6 Minute Walk:  6 Minute Walk    Row Name 03/26/20 1142         6 Minute Walk   Phase Initial     Distance 1000 feet     Walk Time 6 minutes     # of Rest Breaks 0     MPH 1.89     METS 2.04     RPE 12     Perceived Dyspnea  2     VO2 Peak 7.16     Symptoms Yes (comment)     Comments pain in calves (throbbing), SOB (pulling in chest from mask)     Resting HR 64 bpm     Resting BP 132/74     Resting Oxygen Saturation  98 %     Exercise Oxygen Saturation  during 6 min walk 100 %     Max Ex. HR 94 bpm     Max Ex. BP 138/72     2 Minute Post BP 126/68             Oxygen Initial Assessment:   Oxygen Re-Evaluation:   Oxygen Discharge (Final Oxygen Re-Evaluation):   Initial Exercise Prescription:  Initial Exercise Prescription - 03/26/20 1100      Date of Initial Exercise RX and Referring Provider   Date 03/26/20    Referring Provider Charolette Forward MD      Treadmill   MPH 1.8    Grade 0    Minutes 15    METs 2.38      Recumbant Bike   Level 1    RPM 50    Watts 10    Minutes 15    METs 2      NuStep   Level 1    SPM 80    Minutes 15    METs 2      Prescription Details   Frequency (times per week) 2    Duration Progress to 30 minutes of continuous aerobic without signs/symptoms of physical distress      Intensity   THRR 40-80% of Max Heartrate 99-134    Ratings of Perceived Exertion 11-13    Perceived Dyspnea 0-4      Progression   Progression Continue to progress workloads to maintain intensity without signs/symptoms of physical distress.      Resistance Training   Training Prescription Yes    Weight 4 lb    Reps 10-15           Perform Capillary Blood Glucose checks as needed.  Exercise Prescription Changes:  Exercise Prescription Changes    Row Name 03/26/20 1100             Response to Exercise   Blood Pressure (Admit) 132/74       Blood Pressure (Exercise) 138/72       Blood Pressure (Exit) 126/68       Heart Rate (Admit) 64 bpm       Heart Rate (Exercise) 94 bpm  Heart Rate (Exit) 74 bpm       Oxygen Saturation (Admit) 98 %       Oxygen Saturation (Exercise) 100 %       Rating of Perceived Exertion (Exercise) 12       Perceived Dyspnea (Exercise) 2       Symptoms pain in calves, SOB       Comments walk test results              Exercise Comments:  Exercise Comments    Row Name 04/11/20 1039           Exercise Comments Continues with his angina symptoms- he is aware of nitroglycerin use and calling 911 if meds do not control his symptoms Hehas called MD, is planning on  changing cardiologists to have one closer to home.              Exercise Goals and Review:  Exercise Goals    Row Name 03/26/20 1151             Exercise Goals   Increase Physical Activity Yes       Intervention Provide advice, education, support and counseling about physical activity/exercise needs.;Develop an individualized exercise prescription for aerobic and resistive training based on initial evaluation findings, risk stratification, comorbidities and participant's personal goals.       Expected Outcomes Short Term: Attend rehab on a regular basis to increase amount of physical activity.;Long Term: Add in home exercise to make exercise part of routine and to increase amount of physical activity.;Long Term: Exercising regularly at least 3-5 days a week.       Increase Strength and Stamina Yes       Intervention Provide advice, education, support and counseling about physical activity/exercise needs.;Develop an individualized exercise prescription for aerobic and resistive training based on initial evaluation findings, risk stratification, comorbidities and participant's personal goals.       Expected Outcomes Short Term: Increase workloads from initial exercise prescription for resistance, speed, and METs.;Short Term: Perform resistance training exercises routinely during rehab and add in resistance training at home;Long Term: Improve cardiorespiratory fitness, muscular endurance and strength as measured by increased METs and functional capacity (6MWT)       Able to understand and use rate of perceived exertion (RPE) scale Yes       Intervention Provide education and explanation on how to use RPE scale       Expected Outcomes Short Term: Able to use RPE daily in rehab to express subjective intensity level;Long Term:  Able to use RPE to guide intensity level when exercising independently       Able to understand and use Dyspnea scale Yes       Intervention Provide education and  explanation on how to use Dyspnea scale       Expected Outcomes Short Term: Able to use Dyspnea scale daily in rehab to express subjective sense of shortness of breath during exertion;Long Term: Able to use Dyspnea scale to guide intensity level when exercising independently       Knowledge and understanding of Target Heart Rate Range (THRR) Yes       Intervention Provide education and explanation of THRR including how the numbers were predicted and where they are located for reference       Expected Outcomes Short Term: Able to state/look up THRR;Short Term: Able to use daily as guideline for intensity in rehab;Long Term: Able to use THRR to govern intensity when exercising  independently       Able to check pulse independently Yes       Intervention Provide education and demonstration on how to check pulse in carotid and radial arteries.;Review the importance of being able to check your own pulse for safety during independent exercise       Expected Outcomes Short Term: Able to explain why pulse checking is important during independent exercise;Long Term: Able to check pulse independently and accurately       Understanding of Exercise Prescription Yes       Intervention Provide education, explanation, and written materials on patient's individual exercise prescription       Expected Outcomes Short Term: Able to explain program exercise prescription;Long Term: Able to explain home exercise prescription to exercise independently              Exercise Goals Re-Evaluation :  Exercise Goals Re-Evaluation    [place] Name 03/28/20 1022 04/09/20 1604           Exercise Goal Re-Evaluation   Exercise Goals Review Increase Physical Activity;Able to understand and use rate of perceived exertion (RPE) scale;Knowledge and understanding of Target Heart Rate Range (THRR);Understanding of Exercise Prescription;Increase Strength and Stamina;Able to check pulse independently Increase Physical Activity;Increase  Strength and Stamina;Understanding of Exercise Prescription      Comments Reviewed RPE and dyspnea scales, THR and program prescription with pt today.  Pt voiced understanding and was given a copy of goals to take home. [name] was off to a good start in rehab, until the chest pain today.  Last week he went to the beach.  He has completed his first two full days of exercise.  We will continue to monitor his progress.      Expected Outcomes Short: Use RPE daily to regulate intensity. Long: Follow program prescription in THR. Short: Continue to attend rehab regularly  Long: Continue to follow program prescription.             Discharge Exercise Prescription (Final Exercise Prescription Changes):  Exercise Prescription Changes - 03/26/20 1100      Response to Exercise   Blood Pressure (Admit) 132/74    Blood Pressure (Exercise) 138/72    Blood Pressure (Exit) 126/68    Heart Rate (Admit) 64 bpm    Heart Rate (Exercise) 94 bpm    Heart Rate (Exit) 74 bpm    Oxygen Saturation (Admit) 98 %    Oxygen Saturation (Exercise) 100 %    Rating of Perceived Exertion (Exercise) 12    Perceived Dyspnea (Exercise) 2    Symptoms pain in calves, SOB    Comments walk test results           Nutrition:  Target Goals: Understanding of nutrition guidelines, daily intake of sodium <1550m, cholesterol <2056m calories 30% from fat and 7% or less from saturated fats, daily to have 5 or more servings of fruits and vegetables.  Education: Controlling Sodium/Reading Food Labels -Group verbal and written material supporting the discussion of sodium use in heart healthy nutrition. Review and explanation with models, verbal and written materials for utilization of the food label.   Education: General Nutrition Guidelines/Fats and Fiber: -Group instruction provided by verbal, written material, models and posters to present the general guidelines for heart healthy nutrition. Gives an explanation and review of  dietary fats and fiber.   [facility]  [facility]11[facility]  [facility] 1- Verbalizes Understanding  [  need identified]      Biometrics:  Pre Biometrics - 03/26/20 1152      Pre Biometrics   Height 5' 9"  (1.753 m)    Weight 231 lb 4.8 oz (104.9 kg)    BMI (Calculated) 34.14    Single Leg Stand 9.4 seconds            Nutrition Therapy Plan and Nutrition Goals:   Nutrition Assessments:  Nutrition Assessments - 03/26/20 1153      MEDFICTS Scores   Pre Score 60           MEDIFICTS Score Key:          ?70 Need to make dietary changes          40-70 Heart Healthy Diet         ? 40 Therapeutic Level Cholesterol Diet  Nutrition Goals Re-Evaluation:   Nutrition Goals Discharge (Final Nutrition Goals Re-Evaluation):   Psychosocial: Target Goals: Acknowledge presence or absence of significant depression and/or stress, maximize coping skills, provide positive support system. Participant is able to verbalize types and ability to use techniques and skills needed for reducing stress and depression.   Education: Depression - Provides group verbal and written instruction on the correlation between heart/lung disease and depressed mood, treatment options, and the stigmas associated with seeking treatment.   Education: Sleep Hygiene -Provides group verbal and written instruction about how sleep can affect your health.  Define sleep hygiene, discuss sleep cycles and impact of sleep habits. Review good sleep hygiene tips.     Education: Stress and Anxiety: - Provides group verbal and written instruction about the health risks of elevated stress and causes of high stress.  Discuss the correlation between heart/lung disease and anxiety and treatment options. Review healthy ways to manage with stress and anxiety.    Initial Review & Psychosocial Screening:  Initial Psych Review & Screening - 03/21/20 0928       Initial Review   Current issues with Current Sleep Concerns      Family Dynamics   Good Support System? Yes    Comments He can look to his wife, son and daughters for support. He has a hard time sleeping because he has a fluid pill that he takes daily.      Barriers   Psychosocial barriers to participate in program The patient should benefit from training in stress management and relaxation.      Screening Interventions   Interventions Encouraged to exercise;Provide feedback about the scores to participant;To provide support and resources with identified psychosocial needs    Expected Outcomes Short Term goal: Utilizing psychosocial counselor, staff and physician to assist with identification of specific Stressors or current issues interfering with healing process. Setting desired goal for each stressor or current issue identified.;Long Term Goal: Stressors or current issues are controlled or eliminated.;Short Term goal: Identification and review with participant of any Quality of Life or Depression concerns found by scoring the questionnaire.;Long Term goal: The participant improves quality of Life and PHQ9 Scores as seen by post scores and/or verbalization of changes           Quality of Life Scores:   Quality of Life - 03/26/20 1153      Quality of Life   Select Quality of Life      Quality of Life Scores   Health/Function Pre 14.43 %    Socioeconomic Pre 23.07 %    Psych/Spiritual Pre 19.43 %    Family Pre 25.9 %  GLOBAL Pre 28.93 %          Scores of 19 and below usually indicate a poorer quality of life in these areas.  A difference of  2-3 points is a clinically meaningful difference.  A difference of 2-3 points in the total score of the Quality of Life Index has been associated with significant improvement in overall quality of life, self-image, physical symptoms, and general health in studies assessing change in quality of life.  PHQ-9: Recent Review Flowsheet  Data    Depression screen [facility] 2/9 03/26/2020 05/24/2019   Decreased Interest 1 0   Down, Depressed, Hopeless 1 0   PHQ - 2 Score 2 0   Altered sleeping 3 -   Tired, decreased energy 2 -   Change in appetite 3 -   Feeling bad or failure about yourself  0 -   Trouble concentrating 0 -   Moving slowly or fidgety/restless 0 -   Suicidal thoughts 0 -   PHQ-9 Score 10 -   Difficult doing work/chores Not difficult at all -     Interpretation of Total Score  Total Score Depression Severity:  1-4 = Minimal depression, 5-9 = Mild depression, 10-14 = Moderate depression, 15-19 = Moderately severe depression, 20-27 = Severe depression   Psychosocial Evaluation and Intervention:  Psychosocial Evaluation - 03/21/20 0929      Psychosocial Evaluation & Interventions   Interventions Encouraged to exercise with the program and follow exercise prescription    Comments He can look to his wife, son and daughters for support. He has a hard time sleeping because he has a fluid pill that he takes daily.    Expected Outcomes Short: Exercise regularly to support mental health and notify staff of any changes. Long: maintain mental health and well being through teaching of rehab or prescribed medications independently.    Continue Psychosocial Services  Follow up required by staff           Psychosocial Re-Evaluation:   Psychosocial Discharge (Final Psychosocial Re-Evaluation):   Vocational Rehabilitation: Provide vocational rehab assistance to qualifying candidates.   Vocational Rehab Evaluation & Intervention:   Education: Education Goals: Education classes will be provided on a variety of topics geared toward better understanding of heart health and risk factor modification. Participant will state understanding/return demonstration of topics presented as noted by education test scores.  Learning Barriers/Preferences:  Learning Barriers/Preferences - 03/21/20 0926      Learning  Barriers/Preferences   Learning Barriers None    Learning Preferences None           General Cardiac Education Topics:  AED/CPR: - Group verbal and written instruction with the use of models to demonstrate the basic use of the AED with the basic ABC's of resuscitation.   Anatomy & Physiology of the Heart: - Group verbal and written instruction and models provide basic cardiac anatomy and physiology, with the coronary electrical and arterial systems. Review of Valvular disease and Heart Failure   Cardiac Rehab from 04/11/2020 in [facility] Cardiac and Pulmonary Rehab  Date 03/26/20  Instruction Review Code 3- Needs Reinforcement  [need identified]      Cardiac Procedures: - Group verbal and written instruction to review commonly prescribed medications for heart disease. Reviews the medication, class of the drug, and side effects. Includes the steps to properly store meds and maintain the prescription regimen. (beta blockers and nitrates)   Cardiac Rehab from 04/11/2020 in [facility] Cardiac and Pulmonary Rehab  Date 03/28/20  Educator SB  Instruction Review Code 1- Verbalizes Understanding      Cardiac Medications I: - Group verbal and written instruction to review commonly prescribed medications for heart disease. Reviews the medication, class of the drug, and side effects. Includes the steps to properly store meds and maintain the prescription regimen.   Cardiac Medications II: -Group verbal and written instruction to review commonly prescribed medications for heart disease. Reviews the medication, class of the drug, and side effects. (all other drug classes)   Cardiac Rehab from 04/11/2020 in [facility] Cardiac and Pulmonary Rehab  Date 03/26/20  Instruction Review Code 3- Needs Reinforcement  [need identified]       Go Sex-Intimacy & Heart Disease, Get SMART - Goal Setting: - Group verbal and written instruction through game format to discuss heart disease and the return to sexual intimacy.  Provides group verbal and written material to discuss and apply goal setting through the application of the S.M.A.R.T. Method.   Cardiac Rehab from 04/11/2020 in [facility] Cardiac and Pulmonary Rehab  Date 03/28/20  Educator SB  Instruction Review Code 1- Verbalizes Understanding      Other Matters of the Heart: - Provides group verbal, written materials and models to describe Stable Angina and Peripheral Artery. Includes description of the disease process and treatment options available to the cardiac patient.   Infection Prevention: - Provides verbal and written material to individual with discussion of infection control including proper hand washing and proper equipment cleaning during exercise session.   Cardiac Rehab from 04/11/2020 in [facility] Cardiac and Pulmonary Rehab  Date 03/21/20  Educator [facility]  Instruction Review Code 1- Verbalizes Understanding      Falls Prevention: - Provides verbal and written material to individual with discussion of falls prevention and safety.   Cardiac Rehab from 04/11/2020 in [facility] Cardiac and Pulmonary Rehab  Date 03/21/20  Educator [facility]  Instruction Review Code 1- Verbalizes Understanding      Other: -Provides group and verbal instruction on various topics (see comments)   Knowledge Questionnaire Score:  Knowledge Questionnaire Score - 03/26/20 1153      Knowledge Questionnaire Score   Pre Score 20/26 Education Focus: Nutrition, Exercies, Anatomy, Heart Failure           Core Components/Risk Factors/Patient Goals at Admission:  Personal Goals and Risk Factors at Admission - 03/26/20 1154      Core Components/Risk Factors/Patient Goals on Admission    Weight Management Yes;Weight Loss;Obesity    Intervention Weight Management: Develop a combined nutrition and exercise program designed to reach desired caloric intake, while maintaining appropriate intake of nutrient and fiber, sodium and fats, and appropriate energy expenditure required for the  weight goal.;Weight Management: Provide education and appropriate resources to help participant work on and attain dietary goals.;Weight Management/Obesity: Establish reasonable short term and long term weight goals.;Obesity: Provide education and appropriate resources to help participant work on and attain dietary goals.    Admit Weight 231 lb 4.8 oz (104.9 kg)    Goal Weight: Short Term 225 lb (102.1 kg)    Goal Weight: Long Term 220 lb (99.8 kg)    Expected Outcomes Long Term: Adherence to nutrition and physical activity/exercise program aimed toward attainment of established weight goal;Short Term: Continue to assess and modify interventions until short term weight is achieved;Weight Loss: Understanding of general recommendations for a balanced deficit meal plan, which promotes 1-2 lb weight loss per week and includes a negative energy balance of [phone] kcal/d;Understanding recommendations for meals to  include 15-35% energy as protein, 25-35% energy from fat, 35-60% energy from carbohydrates, less than 215m of dietary cholesterol, 20-35 gm of total fiber daily;Understanding of distribution of calorie intake throughout the day with the consumption of 4-5 meals/snacks    Diabetes Yes    Intervention Provide education about signs/symptoms and action to take for hypo/hyperglycemia.;Provide education about proper nutrition, including hydration, and aerobic/resistive exercise prescription along with prescribed medications to achieve blood glucose in normal ranges: Fasting glucose 65-99 mg/dL    Expected Outcomes Short Term: Participant verbalizes understanding of the signs/symptoms and immediate care of hyper/hypoglycemia, proper foot care and importance of medication, aerobic/resistive exercise and nutrition plan for blood glucose control.;Long Term: Attainment of HbA1C < 7%.    Hypertension Yes    Intervention Provide education on lifestyle modifcations including regular physical activity/exercise,  weight management, moderate sodium restriction and increased consumption of fresh fruit, vegetables, and low fat dairy, alcohol moderation, and smoking cessation.;Monitor prescription use compliance.    Expected Outcomes Short Term: Continued assessment and intervention until BP is < 140/936mHG in hypertensive participants. < 130/8051mG in hypertensive participants with diabetes, heart failure or chronic kidney disease.;Long Term: Maintenance of blood pressure at goal levels.    Lipids Yes    Intervention Provide education and support for participant on nutrition & aerobic/resistive exercise along with prescribed medications to achieve LDL <51m62mDL >40mg73m Expected Outcomes Short Term: Participant states understanding of desired cholesterol values and is compliant with medications prescribed. Participant is following exercise prescription and nutrition guidelines.;Long Term: Cholesterol controlled with medications as prescribed, with individualized exercise RX and with personalized nutrition plan. Value goals: LDL < 51mg,67m > 40 mg.           Education:Diabetes - Individual verbal and written instruction to review signs/symptoms of diabetes, desired ranges of glucose level fasting, after meals and with exercise. Acknowledge that pre and post exercise glucose checks will be done for 3 sessions at entry of program.   [facility]  Date 03/21/20  [facility]- Verbalizes Understanding      Education: Know Your Numbers and Risk Factors: -Group verbal and written instruction about important numbers in your health.  Discussion of what are risk factors and how they play a role in the disease process.  Review of Cholesterol, Blood Pressure, Diabetes, and BMI and the role they play in your overall health.   [facility]  Date 03/26/20  Instruction Review Code 3- Needs  Reinforcement  [need identified]      Core Components/Risk Factors/Patient Goals Review:    Core Components/Risk Factors/Patient Goals at Discharge (Final Review):    ITP Comments:  ITP Comments    Row Name 03/21/20 0931 03/26/20 1142 03/28/20 1022 04/10/20 1423 04/11/20 1038   ITP Comments Virtual Visit completed. Patient informed on EP and RD appointment and 6 Minute walk test. Patient also informed of patient health questionnaires on My Chart. Patient Verbalizes understanding. Visit diagnosis can be found in [facility]. Completed 6MWT and gym orientation. Initial ITP created and sent for review to Dr. Mark MEmily Filbertcal Director. First full day of exercise!  Patient was oriented to gym and equipment including functions, settings, policies, and procedures.  Patient's individual exercise prescription and treatment plan were reviewed.  All starting workloads were established based on the results of the 6 minute walk test done at  initial orientation visit.  The plan for exercise progression was also introduced and progression will be customized based on patient's performance and goals. 30 day review completed. ITP sent to Dr. Emily Filbert, Medical Director of Cardiac and Pulmonary Rehab. Continue with ITP unless changes are made by physician. Continues with his angina symptoms- he is aware of nitroglycerin use and calling 911 if meds do not control his symptoms Hehas called MD, is planning on changing cardiologists to have one closer to home.   [place] Name 04/16/20 0908 04/25/20 1306         ITP Comments Doug called to let us know that he has had four episodes since last attending. He has called his doctor and they encouraged him to go to the emergency room.  He was hesitant about this so we talked about urgent care options as he does need to be seen.  He will get seen somewhere and will keep [place] updated. [name] has had a new stent placed and now seeing a new cardiologist.  He is out on hold until he  follows up in the office prior to restarting rehab.  We will discharge him under the NSTEMI order and bring him back with his new stent with Dr. Rockey Situ once cleared to return.             Comments: Discharge ITP

## 2020-04-25 NOTE — Telephone Encounter (Signed)
Verbal taken from provider for pt to have labs in the medical mall on Monday. CBC, BMET orders placed.  She will make pt aware in d/c papers.

## 2020-04-25 NOTE — Progress Notes (Signed)
Progress Note  Patient Name: Jeremy Dorsey Date of Encounter: 04/25/2020  Primary Cardiologist: [name], MD  Subjective   Feeling well. Walked with cardiac rehab - slightly winded but no further angina. Hard of hearing.  Inpatient Medications    Scheduled Meds: . aspirin EC  81 mg Oral Daily  . atorvastatin  80 mg Oral Daily  . calcitRIOL  0.25 mcg Oral Once per day on Sun Tue Thu Sat   And  . [START ON 04/26/2020] calcitRIOL  0.5 mcg Oral Once per day on Mon Wed Fri  . clopidogrel  75 mg Oral Q breakfast  . isosorbide mononitrate  30 mg Oral Daily  . linagliptin  5 mg Oral Daily  . metoprolol tartrate  25 mg Oral BID  . sodium bicarbonate  650 mg Oral BID  . sodium chloride flush  3 mL Intravenous Q12H  . [START ON 04/26/2020] sodium zirconium cyclosilicate  10 g Oral Once per day on Mon Wed Fri   Continuous Infusions: . sodium chloride     PRN Meds: sodium chloride, acetaminophen **AND** diphenhydrAMINE, acetaminophen, nitroGLYCERIN, ondansetron (ZOFRAN) IV, sodium chloride flush   Vital Signs    Vitals:   04/24/20 1925 04/24/20 2023 04/24/20 2356 04/25/20 0531  BP: 130/75 111/70 132/81 122/73  Pulse: 78 65 71 73  Resp:  16 18 18   Temp:  97.9 F (36.6 C) 98.2 F (36.8 C) 98 F (36.7 C)  TempSrc:  Oral Oral Oral  SpO2: 99% 98% 100% 97%  Weight:    106.1 kg  Height:        Intake/Output Summary (Last 24 hours) at 04/25/2020 0908 Last data filed at 04/25/2020 0500 Gross per 24 hour  Intake 622.5 ml  Output 1400 ml  Net -777.5 ml   Last 3 Weights 04/25/2020 04/24/2020 04/23/2020  Weight (lbs) 233 lb 12.8 oz 232 lb 236 lb 8 oz  Weight (kg) 106.051 kg 105.235 kg 107.276 kg     Telemetry    NSR - Personally Reviewed  ECG    NSR 78bpm, low voltage QRS, cannot r/o prior anterior infarct, nonspecific TW changes avL- Personally Reviewed  Physical Exam   GEN: No acute distress.  HEENT: Normocephalic, atraumatic, sclera non-icteric. Neck: No JVD or  bruits. Cardiac: RRR no murmurs, rubs, or gallops.  Radials/DP/PT 1+ and equal bilaterally.  Respiratory: Clear to auscultation bilaterally. Breathing is unlabored. GI: Soft, nontender, non-distended, BS +x 4. MS: no deformity. Extremities: No clubbing or cyanosis. No edema. Distal pedal pulses are 2+ and equal bilaterally. Right radial cath site without hematoma or ecchymosis; good pulse. Neuro:  AAOx3. Follows commands. Psych:  Responds to questions appropriately with a normal affect.  Labs    High Sensitivity Troponin:  No results for input(s): TROPONINIHS in the last 720 hours.    Cardiac EnzymesNo results for input(s): TROPONINI in the last 168 hours. No results for input(s): TROPIPOC in the last 168 hours.   Chemistry Recent Labs  Lab 04/24/20 0731 04/25/20 0740  NA 138 138  K 4.5 5.1  CL 109 111  CO2 16* 18*  GLUCOSE 177* 142*  BUN 87* 82*  CREATININE 5.41* 5.01*  CALCIUM 9.2 8.7*  GFRNONAA 10* 11*  GFRAA 11* 13*  ANIONGAP 13 9     Hematology Recent Labs  Lab 04/24/20 0731 04/25/20 0740  WBC 11.4* 9.2  RBC 3.64* 3.33*  HGB 10.8* 10.2*  HCT 33.1* 31.0*  MCV 90.9 93.1  MCH 29.7 30.6  MCHC 32.6  32.9  RDW 11.9 11.9  PLT 175 152    BNPNo results for input(s): BNP, PROBNP in the last 168 hours.   DDimer No results for input(s): DDIMER in the last 168 hours.   Radiology    CARDIAC CATHETERIZATION  Result Date: 04/24/2020  Ost RCA to Prox RCA lesion is 30% stenosed.  Ost LAD to Prox LAD lesion is 40% stenosed.  Prox LAD to Mid LAD lesion is 99% stenosed.  Post intervention, there is a 0% residual stenosis.  A drug-eluting stent was successfully placed using a STENT RESOLUTE ONYX 4.0X15.  Mid LAD lesion is 90% stenosed.  Post intervention, there is a 0% residual stenosis.  A drug-eluting stent was successfully placed using a STENT RESOLUTE ONYX 3.0X15.  Mid LAD to Dist LAD lesion is 60% stenosed.  Dist LAD lesion is 30% stenosed.  1.  Left dominant  coronary arteries with severe one-vessel coronary artery disease involving proximal LAD with 99% stenosis and mid LAD with 90% stenosis.  There is moderate LAD disease in the mid to distal segment that was left to be treated medically. 2.  Left ventricular angiography was not performed due to chronic kidney disease.  LVEDP was moderately elevated at 25 mmHg. 3.  Successful IVUS guided PCI and drug-eluting stent placement to the mid and proximal LAD. Recommendations: Dual antiplatelet therapy for at least 1 year. Aggressive treatment of risk factors. Given the patient's advanced chronic kidney disease, I am going to observe him overnight with hydration.  Recheck renal function in the morning.  A total of 80 mL of contrast was used for the procedure.    Cardiac Studies   2D echo 03/01/20 1. Left ventricular ejection fraction, by estimation, is 50 to 55%. The  left ventricle has low normal function. The left ventricle demonstrates  regional wall motion abnormalities (see scoring diagram/findings for  description). Left ventricular diastolic  parameters are consistent with Grade I diastolic dysfunction (impaired  relaxation). There is moderate hypokinesis of the left ventricular, basal  inferior wall.  2. Right ventricular systolic function is normal. The right ventricular  size is normal.  3. The mitral valve is normal in structure. No evidence of mitral valve  regurgitation.  4. The aortic valve is normal in structure. Aortic valve regurgitation is  not visualized. Mild aortic valve sclerosis is present, with no evidence  of aortic valve stenosis.  5. The inferior vena cava is dilated in size with >50% respiratory  variability, suggesting right atrial pressure of 8 mmHg.   LHC 04/24/20  Ost RCA to Prox RCA lesion is 30% stenosed.  Ost LAD to Prox LAD lesion is 40% stenosed.  Prox LAD to Mid LAD lesion is 99% stenosed.  Post intervention, there is a 0% residual stenosis.  A  drug-eluting stent was successfully placed using a STENT RESOLUTE ONYX 4.0X15.  Mid LAD lesion is 90% stenosed.  Post intervention, there is a 0% residual stenosis.  A drug-eluting stent was successfully placed using a STENT RESOLUTE ONYX 3.0X15.  Mid LAD to Dist LAD lesion is 60% stenosed.  Dist LAD lesion is 30% stenosed.   1.  Left dominant coronary arteries with severe one-vessel coronary artery disease involving proximal LAD with 99% stenosis and mid LAD with 90% stenosis.  There is moderate LAD disease in the mid to distal segment that was left to be treated medically. 2.  Left ventricular angiography was not performed due to chronic kidney disease.  LVEDP was moderately elevated at 25  mmHg. 3.  Successful IVUS guided PCI and drug-eluting stent placement to the mid and proximal LAD.  Recommendations: Dual antiplatelet therapy for at least 1 year. Aggressive treatment of risk factors. Given the patient's advanced chronic kidney disease, I am going to observe him overnight with hydration.  Recheck renal function in the morning.  A total of 80 mL of contrast was used for the procedure.    Patient Profile     69 y.o. male with history of CAD, HTN, DM2, CKD stage IV-V, morbid obesity, tobacco abuse, HLD who [facility]. He was recently admitted 02/2020 with NSTEMI, troponin 2200, cath deferred due to risk of contrast nephropathy. Nuclear stress test 03/02/20 was abnormal. He was seen back in the office 04/23/20 still with continued unstable anginal symptoms therefore recommended for cath.  Assessment & Plan    1.  Unstable angina/CAD - s/p IVUS-guided DESx2 to prox & mid LAD, residual disease treated medically - continue ASA, Plavix, Imdur, metoprolol, statin  2. Moderately elevated LVEDP - clinically well appearing, hold off diuretic given renal dysfunction  3. CKD stage V - pre cath Cr 5.41, post-cath 5.01, will discuss plans for post cath monitoring with  MD - continue Lokelma 3x/week  4. Essential HTN - BP controlled  5. HLD - continue atorvastatin 80mg  which was started 02/2020 - consider f/u labs in the office setting to reassess for goal LDL <70  6. Anemia - pre-cath Hgb 10.8, post-cath 10.2 without significant change although was in 12-13 range earlier this summer - recommend f/u Hgb when we get f/u BMET - needs close OP f/u with primary care/nephro  Has f/u in the office 10/29 with APP, will discuss with MD whether closer f/u is needed given CKD.  For questions or updates, please contact [place] Please consult [email or web address] for contact info under Cardiology/STEMI.  Signed, Charlie Pitter, PA-C 04/25/2020, 9:08 AM

## 2020-04-25 NOTE — Progress Notes (Signed)
Discharge Progress Report  Patient Details  Name: Jeremy Dorsey MRN: [id number] Date of Birth: [birth year] Referring Provider:     Cardiac Rehab from 03/26/2020 in [facility]  Referring Provider Charolette Forward MD       Number of Visits: 6  Reason for Discharge:  Early Exit:  Readmission and new stent  Smoking History:  Social History   Tobacco Use  Smoking Status Former Smoker  . Packs/day: 1.00  . Years: 30.00  . Pack years: 30.00  . Types: Cigarettes  . Quit date: 08/23/2007  . Years since quitting: 12.6  Smokeless Tobacco Never Used    Diagnosis:  NSTEMI (non-ST elevated myocardial infarction) ([place])  ADL [facility]:   Initial Exercise Prescription:  Initial Exercise Prescription - 03/26/20 1100      Date of Initial Exercise RX and Referring Provider   Date 03/26/20    Referring Provider Charolette Forward MD      Treadmill   MPH 1.8    Grade 0    Minutes 15    METs 2.38      Recumbant Bike   Level 1    RPM 50    Watts 10    Minutes 15    METs 2      NuStep   Level 1    SPM 80    Minutes 15    METs 2      Prescription Details   Frequency (times per week) 2    Duration Progress to 30 minutes of continuous aerobic without signs/symptoms of physical distress      Intensity   THRR 40-80% of Max Heartrate 99-134    Ratings of Perceived Exertion 11-13    Perceived Dyspnea 0-4      Progression   Progression Continue to progress workloads to maintain intensity without signs/symptoms of physical distress.      Resistance Training   Training Prescription Yes    Weight 4 lb    Reps 10-15           Discharge Exercise Prescription (Final Exercise Prescription Changes):  Exercise Prescription Changes - 03/26/20 1100      Response to Exercise   Blood Pressure (Admit) 132/74    Blood Pressure (Exercise) 138/72    Blood Pressure (Exit) 126/68    Heart Rate (Admit) 64 bpm    Heart Rate (Exercise) 94 bpm    Heart Rate (Exit) 74  bpm    Oxygen Saturation (Admit) 98 %    Oxygen Saturation (Exercise) 100 %    Rating of Perceived Exertion (Exercise) 12    Perceived Dyspnea (Exercise) 2    Symptoms pain in calves, SOB    Comments walk test results           Functional Capacity:  6 Minute Walk    Row Name 03/26/20 1142         6 Minute Walk   Phase Initial     Distance 1000 feet     Walk Time 6 minutes     # of Rest Breaks 0     MPH 1.89     METS 2.04     RPE 12     Perceived Dyspnea  2     VO2 Peak 7.16     Symptoms Yes (comment)     Comments pain in calves (throbbing), SOB (pulling in chest from mask)     Resting HR 64 bpm     Resting BP 132/74  Resting Oxygen Saturation  98 %     Exercise Oxygen Saturation  during 6 min walk 100 %     Max Ex. HR 94 bpm     Max Ex. BP 138/72     2 Minute Post BP 126/68            Psychological, QOL, Others - Outcomes: PHQ 2/9: Depression screen [facility] 2/9 03/26/2020 05/24/2019  Decreased Interest 1 0  Down, Depressed, Hopeless 1 0  PHQ - 2 Score 2 0  Altered sleeping 3 -  Tired, decreased energy 2 -  Change in appetite 3 -  Feeling bad or failure about yourself  0 -  Trouble concentrating 0 -  Moving slowly or fidgety/restless 0 -  Suicidal thoughts 0 -  PHQ-9 Score 10 -  Difficult doing work/chores Not difficult at all -    Quality of Life:  Quality of Life - 03/26/20 1153      Quality of Life   Select Quality of Life      Quality of Life Scores   Health/Function Pre 14.43 %    Socioeconomic Pre 23.07 %    Psych/Spiritual Pre 19.43 %    Family Pre 25.9 %    GLOBAL Pre 28.93 %           Personal Goals: Goals established at orientation with interventions provided to work toward goal.  Personal Goals and Risk Factors at Admission - 03/26/20 1154      Core Components/Risk Factors/Patient Goals on Admission    Weight Management Yes;Weight Loss;Obesity    Intervention Weight Management: Develop a combined nutrition and exercise program  designed to reach desired caloric intake, while maintaining appropriate intake of nutrient and fiber, sodium and fats, and appropriate energy expenditure required for the weight goal.;Weight Management: Provide education and appropriate resources to help participant work on and attain dietary goals.;Weight Management/Obesity: Establish reasonable short term and long term weight goals.;Obesity: Provide education and appropriate resources to help participant work on and attain dietary goals.    Admit Weight 231 lb 4.8 oz (104.9 kg)    Goal Weight: Short Term 225 lb (102.1 kg)    Goal Weight: Long Term 220 lb (99.8 kg)    Expected Outcomes Long Term: Adherence to nutrition and physical activity/exercise program aimed toward attainment of established weight goal;Short Term: Continue to assess and modify interventions until short term weight is achieved;Weight Loss: Understanding of general recommendations for a balanced deficit meal plan, which promotes 1-2 lb weight loss per week and includes a negative energy balance of [phone] kcal/d;Understanding recommendations for meals to include 15-35% energy as protein, 25-35% energy from fat, 35-60% energy from carbohydrates, less than 200mg  of dietary cholesterol, 20-35 gm of total fiber daily;Understanding of distribution of calorie intake throughout the day with the consumption of 4-5 meals/snacks    Diabetes Yes    Intervention Provide education about signs/symptoms and action to take for hypo/hyperglycemia.;Provide education about proper nutrition, including hydration, and aerobic/resistive exercise prescription along with prescribed medications to achieve blood glucose in normal ranges: Fasting glucose 65-99 mg/dL    Expected Outcomes Short Term: Participant verbalizes understanding of the signs/symptoms and immediate care of hyper/hypoglycemia, proper foot care and importance of medication, aerobic/resistive exercise and nutrition plan for blood glucose  control.;Long Term: Attainment of HbA1C < 7%.    Hypertension Yes    Intervention Provide education on lifestyle modifcations including regular physical activity/exercise, weight management, moderate sodium restriction and increased consumption of fresh fruit, vegetables,  and low fat dairy, alcohol moderation, and smoking cessation.;Monitor prescription use compliance.    Expected Outcomes Short Term: Continued assessment and intervention until BP is < 140/72mm HG in hypertensive participants. < 130/73mm HG in hypertensive participants with diabetes, heart failure or chronic kidney disease.;Long Term: Maintenance of blood pressure at goal levels.    Lipids Yes    Intervention Provide education and support for participant on nutrition & aerobic/resistive exercise along with prescribed medications to achieve LDL 70mg , HDL >40mg .    Expected Outcomes Short Term: Participant states understanding of desired cholesterol values and is compliant with medications prescribed. Participant is following exercise prescription and nutrition guidelines.;Long Term: Cholesterol controlled with medications as prescribed, with individualized exercise RX and with personalized nutrition plan. Value goals: LDL < 70mg , HDL > 40 mg.            Personal Goals Discharge:   Exercise Goals and Review:  Exercise Goals    Row Name 03/26/20 1151             Exercise Goals   Increase Physical Activity Yes       Intervention Provide advice, education, support and counseling about physical activity/exercise needs.;Develop an individualized exercise prescription for aerobic and resistive training based on initial evaluation findings, risk stratification, comorbidities and participant's personal goals.       Expected Outcomes Short Term: Attend rehab on a regular basis to increase amount of physical activity.;Long Term: Add in home exercise to make exercise part of routine and to increase amount of physical activity.;Long  Term: Exercising regularly at least 3-5 days a week.       Increase Strength and Stamina Yes       Intervention Provide advice, education, support and counseling about physical activity/exercise needs.;Develop an individualized exercise prescription for aerobic and resistive training based on initial evaluation findings, risk stratification, comorbidities and participant's personal goals.       Expected Outcomes Short Term: Increase workloads from initial exercise prescription for resistance, speed, and METs.;Short Term: Perform resistance training exercises routinely during rehab and add in resistance training at home;Long Term: Improve cardiorespiratory fitness, muscular endurance and strength as measured by increased METs and functional capacity (6MWT)       Able to understand and use rate of perceived exertion (RPE) scale Yes       Intervention Provide education and explanation on how to use RPE scale       Expected Outcomes Short Term: Able to use RPE daily in rehab to express subjective intensity level;Long Term:  Able to use RPE to guide intensity level when exercising independently       Able to understand and use Dyspnea scale Yes       Intervention Provide education and explanation on how to use Dyspnea scale       Expected Outcomes Short Term: Able to use Dyspnea scale daily in rehab to express subjective sense of shortness of breath during exertion;Long Term: Able to use Dyspnea scale to guide intensity level when exercising independently       Knowledge and understanding of Target Heart Rate Range (THRR) Yes       Intervention Provide education and explanation of THRR including how the numbers were predicted and where they are located for reference       Expected Outcomes Short Term: Able to state/look up THRR;Short Term: Able to use daily as guideline for intensity in rehab;Long Term: Able to use THRR to govern intensity when exercising independently  Able to check pulse independently  Yes       Intervention Provide education and demonstration on how to check pulse in carotid and radial arteries.;Review the importance of being able to check your own pulse for safety during independent exercise       Expected Outcomes Short Term: Able to explain why pulse checking is important during independent exercise;Long Term: Able to check pulse independently and accurately       Understanding of Exercise Prescription Yes       Intervention Provide education, explanation, and written materials on patient's individual exercise prescription       Expected Outcomes Short Term: Able to explain program exercise prescription;Long Term: Able to explain home exercise prescription to exercise independently              Exercise Goals Re-Evaluation:  Exercise Goals Re-Evaluation    [place] Name 03/28/20 1022 04/09/20 1604           Exercise Goal Re-Evaluation   Exercise Goals Review Increase Physical Activity;Able to understand and use rate of perceived exertion (RPE) scale;Knowledge and understanding of Target Heart Rate Range (THRR);Understanding of Exercise Prescription;Increase Strength and Stamina;Able to check pulse independently Increase Physical Activity;Increase Strength and Stamina;Understanding of Exercise Prescription      Comments Reviewed RPE and dyspnea scales, THR and program prescription with pt today.  Pt voiced understanding and was given a copy of goals to take home. [name] was off to a good start in rehab, until the chest pain today.  Last week he went to the beach.  He has completed his first two full days of exercise.  We will continue to monitor his progress.      Expected Outcomes Short: Use RPE daily to regulate intensity. Long: Follow program prescription in THR. Short: Continue to attend rehab regularly  Long: Continue to follow program prescription.             Nutrition & Weight - Outcomes:  Pre Biometrics - 03/26/20 1152      Pre Biometrics   Height 5\' 9"  (1.753  m)    Weight 231 lb 4.8 oz (104.9 kg)    BMI (Calculated) 34.14    Single Leg Stand 9.4 seconds            Nutrition:   Nutrition Discharge:  Nutrition Assessments - 03/26/20 1153      MEDFICTS Scores   Pre Score 60           Education Questionnaire Score:  Knowledge Questionnaire Score - 03/26/20 1153      Knowledge Questionnaire Score   Pre Score 20/26 Education Focus: Nutrition, Exercies, Anatomy, Heart Failure           Goals reviewed with patient; copy given to patient.

## 2020-04-26 LAB — GLUCOSE, CAPILLARY: Glucose-Capillary: 123 mg/dL — ABNORMAL HIGH (ref 70–99)

## 2020-04-29 ENCOUNTER — Other Ambulatory Visit
Admission: RE | Admit: 2020-04-29 | Discharge: 2020-04-29 | Disposition: A | Discharge status: Home / Self Care | Payer: PPO | Attending: Physician Assistant | Admitting: Physician Assistant

## 2020-04-29 ENCOUNTER — Telehealth: Payer: Self-pay | Admitting: *Deleted

## 2020-04-29 DIAGNOSIS — I25110 Atherosclerotic heart disease of native coronary artery with unstable angina pectoris: Secondary | ICD-10-CM | POA: Diagnosis not present

## 2020-04-29 LAB — CBC
HCT: 31.8 % — ABNORMAL LOW (ref 39.0–52.0)
Hemoglobin: 10.9 g/dL — ABNORMAL LOW (ref 13.0–17.0)
MCH: 30.3 pg (ref 26.0–34.0)
MCHC: 34.3 g/dL (ref 30.0–36.0)
MCV: 88.3 fL (ref 80.0–100.0)
Platelets: 158 10*3/uL (ref 150–400)
RBC: 3.6 MIL/uL — ABNORMAL LOW (ref 4.22–5.81)
RDW: 12.2 % (ref 11.5–15.5)
WBC: 8.9 10*3/uL (ref 4.0–10.5)
nRBC: 0 % (ref 0.0–0.2)

## 2020-04-29 LAB — BASIC METABOLIC PANEL
Anion gap: 10 (ref 5–15)
BUN: 87 mg/dL — ABNORMAL HIGH (ref 8–23)
CO2: 18 mmol/L — ABNORMAL LOW (ref 22–32)
Calcium: 8.9 mg/dL (ref 8.9–10.3)
Chloride: 110 mmol/L (ref 98–111)
Creatinine, Ser: 5.35 mg/dL — ABNORMAL HIGH (ref 0.61–1.24)
GFR calc Af Amer: 12 mL/min — ABNORMAL LOW (ref >60)
GFR calc non Af Amer: 10 mL/min — ABNORMAL LOW (ref >60)
Glucose, Bld: 168 mg/dL — ABNORMAL HIGH (ref 70–99)
Potassium: 4.9 mmol/L (ref 3.5–5.1)
Sodium: 138 mmol/L (ref 135–145)

## 2020-04-29 NOTE — Telephone Encounter (Signed)
Thank you! Please make sure Dr. Moshe Cipro is aware of value. Thanks.

## 2020-04-29 NOTE — Telephone Encounter (Signed)
-----   Message from Charlie Pitter, [place] sent at 04/29/2020  1:43 PM EDT ----- Please let pt know that kidney function number is elevated at 5.35 although this appears stable from recent pre-cath value of 5.41. His post-cath value was 5.01 but likely slightly lower at that time due to post-cath fluids around time of procedure. Lab result discussed with Dr. Rockey Situ - recommend involving nephrologist to make them aware of follow-up value. Pam, can you please call patient to find out who this is and call their office to relay value? (Make sure to let them know he is not on dialysis yet.) I believe he said when he was in the hospital he does not see them for another month but may need to see them sooner. Thanks

## 2020-04-29 NOTE — Telephone Encounter (Signed)
Spoke with patient and reviewed results and recommendations. Confirmed upcoming appointment here in our office and inquired about a kidney specialist. He reports seeing Dr. Hillery Hunter at [facility] in [place] at [phone] and states that he has been going there for some time. Advised that I would update this information in his chart and will forward to all providers as well. He was appreciative for the call, states he is feeling much better, and has no further questions at this time.

## 2020-05-02 ENCOUNTER — Ambulatory Visit (INDEPENDENT_AMBULATORY_CARE_PROVIDER_SITE_OTHER): Payer: PPO | Admitting: Family

## 2020-05-02 ENCOUNTER — Encounter: Payer: Self-pay | Admitting: Family

## 2020-05-02 ENCOUNTER — Other Ambulatory Visit: Payer: Self-pay

## 2020-05-02 VITALS — BP 132/78 | HR 66 | Ht 70.0 in | Wt 233.0 lb

## 2020-05-02 DIAGNOSIS — N184 Chronic kidney disease, stage 4 (severe): Secondary | ICD-10-CM | POA: Diagnosis not present

## 2020-05-02 DIAGNOSIS — Z794 Long term (current) use of insulin: Secondary | ICD-10-CM

## 2020-05-02 DIAGNOSIS — I25118 Atherosclerotic heart disease of native coronary artery with other forms of angina pectoris: Secondary | ICD-10-CM | POA: Diagnosis not present

## 2020-05-02 DIAGNOSIS — E118 Type 2 diabetes mellitus with unspecified complications: Secondary | ICD-10-CM

## 2020-05-02 DIAGNOSIS — E785 Hyperlipidemia, unspecified: Secondary | ICD-10-CM

## 2020-05-02 DIAGNOSIS — E1159 Type 2 diabetes mellitus with other circulatory complications: Secondary | ICD-10-CM | POA: Diagnosis not present

## 2020-05-02 MED ORDER — NITROGLYCERIN 0.4 MG SL SUBL: 0.4000 mg | SUBLINGUAL_TABLET | SUBLINGUAL | 12 refills | Status: DC | PRN

## 2020-05-02 MED ORDER — ATORVASTATIN CALCIUM 80 MG PO TABS: 80.0000 mg | ORAL_TABLET | Freq: Every day | ORAL | 1 refills | Status: DC

## 2020-05-02 MED ORDER — CLOPIDOGREL BISULFATE 75 MG PO TABS: 75.0000 mg | ORAL_TABLET | Freq: Every day | ORAL | 1 refills | Status: DC

## 2020-05-02 MED ORDER — METOPROLOL TARTRATE 25 MG PO TABS: 25.0000 mg | ORAL_TABLET | Freq: Two times a day (BID) | ORAL | 1 refills | Status: DC

## 2020-05-02 NOTE — Patient Instructions (Signed)
Medication Instructions:  No medication changes today.   Discuss Januvia with your primary care provider.   *If you need a refill on your cardiac medications before your next appointment, please call your pharmacy*  Lab Work: No lab work today.  We will collect your cholesterol panel with your next lab results.   If you have labs (blood work) drawn today and your tests are completely normal, you will receive your results only by: Marland Kitchen MyChart Message (if you have MyChart) OR . A paper copy in the mail If you have any lab test that is abnormal or we need to change your treatment, we will call you to review the results.  Testing/Procedures: Your EKG today was stable compared to previous.   Follow-Up: At [facility], you and your health needs are our priority.  As part of our continuing mission to provide you with exceptional heart care, we have created designated Provider Care Teams.  These Care Teams include your primary Cardiologist (physician) and Advanced Practice Providers (APPs -  Physician Assistants and Nurse Practitioners) who all work together to provide you with the care you need, when you need it.  We recommend signing up for the patient portal called "MyChart".  Sign up information is provided on this After Visit Summary.  MyChart is used to connect with patients for Virtual Visits (Telemedicine).  Patients are able to view lab/test results, encounter notes, upcoming appointments, etc.  Non-urgent messages can be sent to your provider as well.   To learn more about what you can do with MyChart, go to [email or web address].    Your next appointment:   2 month(s)  The format for your next appointment:   In Person  Provider:   You may see [name], MD or one of the following Advanced Practice Providers on your designated Care Team:    Murray Hodgkins, NP  Christell Faith, PA-C  Marrianne Mood, PA-C  Laurann Montana, NP  Cadence Kathlen Mody, [place]  Other  Instructions  Guideline directed medical therapy for heart disease:  A = Aspirin  B = Beta blocker = Metoprolol (this helps your heart relax)  C = "Cholesterol" = Atorvastatin (Lipitor)  D = "Don't forget nitroglycerin"  E = "Extras" = Plavix (this is for at least one year to protect your stent)

## 2020-05-02 NOTE — Progress Notes (Signed)
Office Visit    Patient Name: Jeremy Dorsey Date of Encounter: 05/02/2020  Primary Care Provider:  Mar Daring, PA-C Primary Cardiologist:  [name], MD Electrophysiologist:  None   Chief Complaint    Jeremy Dorsey is a 69 y.o. male with a hx of CAD s/p NSTEMI 02/2020 and DESx2 to LAD 04/24/20, HTN, DM2, CKD IV-V, morbid obesity, tobacco use, HLD presents today for follow up after cardiac catheterization with PCI/DES to LAD x2.    Past Medical History    Past Medical History:  Diagnosis Date  . Anemia   . CKD (chronic kidney disease), stage IV ([place])   . Coronary artery disease    a. s/p IVUS-guided DESx2 to prox & mid LAD, residual disease treated medically. EF 50-55% by recent echo 02/2020.  . 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 Hyperlipidemia LDL goal <70   . Hypertension 2008  . Lower extremity edema   . Shoulder pain    Right  . Urinary complication    Past Surgical History:  Procedure Laterality Date  . COLONOSCOPY  1990  . COLONOSCOPY  06/09/2011   Dr Bary Castilla  . CORONARY STENT INTERVENTION N/A 04/24/2020   Procedure: CORONARY STENT INTERVENTION;  Surgeon: Wellington Hampshire, MD;  Location: [facility];  Service: Cardiovascular;  Laterality: N/A;  . CYSTOSCOPY W/ RETROGRADES Bilateral 08/01/2018   Procedure: CYSTOSCOPY WITH RETROGRADE PYELOGRAM;  Surgeon: Billey Co, MD;  Location: [facility];  Service: Urology;  Laterality: Bilateral;  . CYSTOSCOPY WITH BIOPSY N/A 08/01/2018   Procedure: CYSTOSCOPY WITH Bladder BIOPSY;  Surgeon: Billey Co, MD;  Location: [facility];  Service: Urology;  Laterality: N/A;  . CYSTOSCOPY WITH STENT PLACEMENT Right 08/01/2018   Procedure: CYSTOSCOPY WITH STENT PLACEMENT;  Surgeon: Billey Co, MD;  Location: [facility];  Service: Urology;  Laterality: Right;  . EYE SURGERY Right    laser surgery  . INTRAVASCULAR ULTRASOUND/IVUS N/A 04/24/2020   Procedure: Intravascular Ultrasound/IVUS;   Surgeon: Wellington Hampshire, MD;  Location: [facility];  Service: Cardiovascular;  Laterality: N/A;  . LEFT HEART CATH AND CORONARY ANGIOGRAPHY N/A 04/24/2020   Procedure: LEFT HEART CATH AND CORONARY ANGIOGRAPHY;  Surgeon: Wellington Hampshire, MD;  Location: [facility];  Service: Cardiovascular;  Laterality: N/A;  . SHOULDER ARTHROSCOPY WITH OPEN ROTATOR CUFF REPAIR Right 08/25/2018   Procedure: SHOULDER ARTHROSCOPY WITH MINI OPEN ROTATOR CUFF REPAIR;  Surgeon: Thornton Park, MD;  Location: [facility];  Service: Orthopedics;  Laterality: Right;  . TRANSURETHRAL RESECTION OF BLADDER TUMOR N/A 08/01/2018   Procedure: TRANSURETHRAL RESECTION OF BLADDER TUMOR (TURBT);  Surgeon: Billey Co, MD;  Location: [facility];  Service: Urology;  Laterality: N/A;  . URETEROSCOPY WITH HOLMIUM LASER LITHOTRIPSY Right 08/01/2018   Procedure: URETEROSCOPY WITH HOLMIUM LASER LITHOTRIPSY;  Surgeon: Billey Co, MD;  Location: [facility];  Service: Urology;  Laterality: Right;    Allergies  Allergies  Allergen Reactions  . Oxycodone Nausea And Vomiting  . Hydrocodone Nausea And Vomiting    History of Present Illness    Jeremy Dorsey is a 69 y.o. male with a hx of  CAD s/p NSTEMI 02/2020 and DESx2 to LAD 04/24/20, HTN, DM2, CKD IV-V, morbid obesity, tobacco use, HLD last seen while hospitalized.  Mr. Wehner was hospitalized 02/2020 due to NSTEMI with troponin of 2200. Nuclear stress test 03/02/20 was abnormal. Cardiac catheterization was deferred due to risk of contrast nephropathy. He  had trouble reaching that cardiologist's office and as such established with Dr. Rockey Situ 04/23/20. He noted continued anginal symptoms and was recommended for cardiac cath.   Presents today for follow up with his wife. Reports no anginal symptoms since discharge. He is pleased to no longer require PRN nitroglycerin. We reviewed his catheterization in detail. Reports no shortness of breath nor dyspnea on exertion.  Reports no chest pain, pressure, or tightness. No edema, orthopnea, PND. Reports no palpitations. He is excited to return to cardiac rehab. 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   EKGs/Labs/Other Studies Reviewed:   The following studies were reviewed today: 2D echo 03/01/20  1. Left ventricular ejection fraction, by estimation, is 50 to 55%. The  left ventricle has low normal function. The left ventricle demonstrates  regional wall motion abnormalities (see scoring diagram/findings for  description). Left ventricular diastolic   parameters are consistent with Grade I diastolic dysfunction (impaired  relaxation). There is moderate hypokinesis of the left ventricular, basal  inferior wall.   2. Right ventricular systolic function is normal. The right ventricular  size is normal.   3. The mitral valve is normal in structure. No evidence of mitral valve  regurgitation.   4. The aortic valve is normal in structure. Aortic valve regurgitation is  not visualized. Mild aortic valve sclerosis is present, with no evidence  of aortic valve stenosis.   5. The inferior vena cava is dilated in size with >50% respiratory  variability, suggesting right atrial pressure of 8 mmHg.    LHC 04/24/20  Ost RCA to Prox RCA lesion is 30% stenosed.  Ost LAD to Prox LAD lesion is 40% stenosed.  Prox LAD to Mid LAD lesion is 99% stenosed.  Post intervention, there is a 0% residual stenosis.  A drug-eluting stent was successfully placed using a STENT RESOLUTE ONYX 4.0X15.  Mid LAD lesion is 90% stenosed.  Post intervention, there is a 0% residual stenosis.  A drug-eluting stent was successfully placed using a STENT RESOLUTE ONYX 3.0X15.  Mid LAD to Dist LAD lesion is 60% stenosed.  Dist LAD lesion is 30% stenosed.   1.  Left dominant coronary arteries with severe one-vessel coronary artery disease involving proximal LAD with 99% stenosis and mid LAD with  90% stenosis.  There is moderate LAD disease in the mid to distal segment that was left to be treated medically. 2.  Left ventricular angiography was not performed due to chronic kidney disease.  LVEDP was moderately elevated at 25 mmHg. 3.  Successful IVUS guided PCI and drug-eluting stent placement to the mid and proximal LAD.   Recommendations: Dual antiplatelet therapy for at least 1 year. Aggressive treatment of risk factors. Given the patient's advanced chronic kidney disease, I am going to observe him overnight with hydration.  Recheck renal function in the morning.  A total of 80 mL of contrast was used for the procedure.  EKG:  EKG is ordered today.  The ekg ordered today demonstrates sinus and 66 bpm with first-degree AV block (PR 256) and T wave inversion in lateral leads (V4, V5).  Recent Labs: 04/29/2020: BUN 87; Creatinine, Ser 5.35; Hemoglobin 10.9; Platelets 158; Potassium 4.9; Sodium 138  Recent Lipid Panel    Component Value Date/Time   CHOL 137 03/02/2020 0111   TRIG 106 03/02/2020 0111   HDL 28 (L) 03/02/2020 0111   CHOLHDL 4.9 03/02/2020 0111   VLDL 21 03/02/2020 0111  LDLCALC 88 03/02/2020 0111    Home Medications   Current Meds  Medication Sig  . aspirin EC 81 MG EC tablet Take 1 tablet (81 mg total) by mouth daily. Swallow whole.  Marland Kitchen atorvastatin (LIPITOR) 80 MG tablet Take 1 tablet (80 mg total) by mouth daily.  . calcitRIOL (ROCALTROL) 0.25 MCG capsule Take 0.5 mcg by mouth daily. (Prescription instructions say to take 2 capsules by mouth daily)  . clopidogrel (PLAVIX) 75 MG tablet Take 1 tablet (75 mg total) by mouth daily with breakfast.  . metoprolol tartrate (LOPRESSOR) 25 MG tablet Take 1 tablet (25 mg total) by mouth 2 (two) times daily.  . nitroGLYCERIN (NITROSTAT) 0.4 MG SL tablet Place 1 tablet (0.4 mg total) under the tongue every 5 (five) minutes x 3 doses as needed for chest pain.  . sitaGLIPtin (JANUVIA) 25 MG tablet Take 1 tablet (25 mg total)  by mouth daily.  . sodium bicarbonate 650 MG tablet Take 650 mg by mouth 2 (two) times daily.   . sodium zirconium cyclosilicate (LOKELMA) 10 g PACK packet Take 10 g by mouth 3 (three) times a week.      Review of Systems      Review of Systems  Constitutional: Negative for chills, fever and malaise/fatigue.  Cardiovascular: Negative for chest pain, dyspnea on exertion, irregular heartbeat, leg swelling, near-syncope, orthopnea, palpitations and syncope.  Respiratory: Negative for cough, shortness of breath and wheezing.   Gastrointestinal: Negative for melena, nausea and vomiting.  Genitourinary: Negative for hematuria.  Neurological: Negative for dizziness, light-headedness and weakness.   All other systems reviewed and are otherwise negative except as noted above.  Physical Exam    VS:  BP 132/78 (BP Location: Left Arm, Patient Position: Sitting, Cuff Size: Normal)   Pulse 66   Ht 5\' 10"  (1.778 m)   Wt 233 lb (105.7 kg)   SpO2 98%   BMI 33.43 kg/m  , BMI Body mass index is 33.43 kg/m. GEN: Well nourished, well developed, in no acute distress. HEENT: normal. Neck: Supple, no JVD, carotid bruits, or masses. Cardiac: RRR, no murmurs, rubs, or gallops. No clubbing, cyanosis, edema.  Radials/DP/PT 2+ and equal bilaterally.  Respiratory:  Respirations regular and unlabored, clear to auscultation bilaterally. GI: Soft, nontender, nondistended, BS + x 4. MS: No deformity or atrophy. Skin: Warm and dry, no rash. R radial catheterization site clean, dry, intact. No erythema, ecchymosis nor signs of infection.  Neuro:  Strength and sensation are intact. Psych: Normal affect.  Assessment & Plan    1. CAD - s/p NSTEMI 02/2020 and DESx2 to prox & mid LAD 04/24/20 with residual disease treated medically. EKG today NSR with 1st degree block and TWI in lateral leads. He reports no anginal symptoms. GDMT includes DAPT for at least one year from intervention with Aspirin/Plavix. Denies  bleeding complications. Additional GDMT includes Metoprolol and Atorvastatin. He was participating in cardiac rehab prior to his catheterization, recommended to resume cardiac rehab. 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   2. Moderately elevated LVEDP - By catheterization 04/24/20. Euvolemic on exam. Defer diuretic due to renal dysfunction.   3. CKD V - Pre cath Cr 5.41, post-cath 5.01, repeat BMP 04/29/20 creatinine 5.35, GFR 10. Continue Lokelma 3x/week. Continue to follow closely with nephrology Dr. Moshe Cipro of [facility].  Will forward this note to their office for continuity of care.  4. HTN - BP well controlled. Continue current antihypertensive regimen.   5. HLD, LDL goal <70 - Atorvastatin 80mg  daily started 02/2020. He politely declines lab work today. Repeat lipid panel/liver function at next visit.   6. DM2 - Presently on Januvia as prescribed at discharge 02/2020. Encouraged to follow up with PCP. Hypoglycemic agents limited by renal function.   7. Anemia - 04/29/20 Hb 10.9 overall stable post catheterization. No bleeding complications on DAPT. Likely anemia due to CKD.  Disposition: Follow up in 2 month(s) with Dr. Rockey Situ or APP   Loel Dubonnet, NP 05/02/2020, 10:06 AM

## 2020-05-03 ENCOUNTER — Encounter: Payer: Self-pay | Admitting: Family

## 2020-05-06 ENCOUNTER — Encounter: Payer: Self-pay | Admitting: Physician Assistant

## 2020-05-06 NOTE — Telephone Encounter (Signed)
Office currently closed and will try to call them first thing in the morning.

## 2020-05-06 NOTE — Telephone Encounter (Addendum)
Received reply via staff message from Dr. Moshe Cipro which relayed instructions in reference to checking labs after cath which have been done. I replied the following - "Thank you for your reply! The 5.35 was a post-cath value, obtained 9/27 (his cath was 9/22). This was relatively stable to his pre-cath value of 5.41, although that was an uptrend from his previous values in the 4.7-4.9 range by our records. The day after his cath/PCI, his Cr was 5.01 but we suspect it was simply lower due to post-cath fluids around the time of the procedure.  It looks like he followed back up in our office on 9/30 but declined labwork at that time to that provider. Let me know if we should push further to get him back sooner for recheck. Thanks for your help."  Since [facility] team may only be using Epic for hospital work, will route to office RN to call their outpatient office by phone to ask them to relay this message to Dr. Moshe Cipro. Just want to make sure everyone is on the same page and close the loop - thanks!  Allah Reason PA-C

## 2020-05-07 ENCOUNTER — Encounter: Payer: PPO | Attending: Cardiovascular Disease | Admitting: *Deleted

## 2020-05-07 ENCOUNTER — Other Ambulatory Visit: Payer: Self-pay

## 2020-05-07 VITALS — Ht 69.1 in | Wt 231.4 lb

## 2020-05-07 DIAGNOSIS — Z7902 Long term (current) use of antithrombotics/antiplatelets: Secondary | ICD-10-CM | POA: Insufficient documentation

## 2020-05-07 DIAGNOSIS — Z79899 Other long term (current) drug therapy: Secondary | ICD-10-CM | POA: Diagnosis not present

## 2020-05-07 DIAGNOSIS — Z7982 Long term (current) use of aspirin: Secondary | ICD-10-CM | POA: Insufficient documentation

## 2020-05-07 DIAGNOSIS — Z87891 Personal history of nicotine dependence: Secondary | ICD-10-CM | POA: Diagnosis not present

## 2020-05-07 DIAGNOSIS — E1122 Type 2 diabetes mellitus with diabetic chronic kidney disease: Secondary | ICD-10-CM | POA: Insufficient documentation

## 2020-05-07 DIAGNOSIS — I2510 Atherosclerotic heart disease of native coronary artery without angina pectoris: Secondary | ICD-10-CM | POA: Diagnosis not present

## 2020-05-07 DIAGNOSIS — E669 Obesity, unspecified: Secondary | ICD-10-CM | POA: Insufficient documentation

## 2020-05-07 DIAGNOSIS — Z6834 Body mass index (BMI) 34.0-34.9, adult: Secondary | ICD-10-CM | POA: Diagnosis not present

## 2020-05-07 DIAGNOSIS — N184 Chronic kidney disease, stage 4 (severe): Secondary | ICD-10-CM | POA: Insufficient documentation

## 2020-05-07 DIAGNOSIS — Z955 Presence of coronary angioplasty implant and graft: Secondary | ICD-10-CM | POA: Diagnosis not present

## 2020-05-07 DIAGNOSIS — Z7984 Long term (current) use of oral hypoglycemic drugs: Secondary | ICD-10-CM | POA: Insufficient documentation

## 2020-05-07 DIAGNOSIS — I252 Old myocardial infarction: Secondary | ICD-10-CM | POA: Insufficient documentation

## 2020-05-07 DIAGNOSIS — I1310 Hypertensive heart and chronic kidney disease without heart failure, with stage 1 through stage 4 chronic kidney disease, or unspecified chronic kidney disease: Secondary | ICD-10-CM | POA: Diagnosis not present

## 2020-05-07 NOTE — Patient Instructions (Signed)
Patient Instructions  Patient Details  Name: Jeremy Dorsey MRN: [id number] Date of Birth: [birth year] Referring Provider:  Minna Merritts, MD  Below are your personal goals for exercise, nutrition, and risk factors. Our goal is to help you stay on track towards obtaining and maintaining these goals. We will be discussing your progress on these goals with you throughout the program.  Initial Exercise Prescription:  Initial Exercise Prescription - 05/07/20 1000      Date of Initial Exercise RX and Referring Provider   Date 05/07/20    Referring Provider [name] MD      Treadmill   MPH 1.7    Grade 0    Minutes 15    METs 2.3      Recumbant Bike   Level 1    RPM 50    Watts 5    Minutes 15    METs 2      NuStep   Level 1    SPM 80    Minutes 15    METs 2      REL-XR   Level 1    Speed 50    Minutes 15    METs 2      Prescription Details   Frequency (times per week) 2    Duration Progress to 30 minutes of continuous aerobic without signs/symptoms of physical distress      Intensity   THRR 40-80% of Max Heartrate 104-135    Ratings of Perceived Exertion 11-13    Perceived Dyspnea 0-4      Progression   Progression Continue to progress workloads to maintain intensity without signs/symptoms of physical distress.      Resistance Training   Training Prescription Yes    Weight 3 lb    Reps 10-15           Exercise Goals: Frequency: Be able to perform aerobic exercise two to three times per week in program working toward 2-5 days per week of home exercise.  Intensity: Work with a perceived exertion of 11 (fairly light) - 15 (hard) while following your exercise prescription.  We will make changes to your prescription with you as you progress through the program.   Duration: Be able to do 30 to 45 minutes of continuous aerobic exercise in addition to a 5 minute warm-up and a 5 minute cool-down routine.   Nutrition Goals: Your personal nutrition  goals will be established when you do your nutrition analysis with the dietician.  The following are general nutrition guidelines to follow: Cholesterol < 200mg /day Sodium < 1500mg /day Fiber: Men over 50 yrs - 30 grams per day  Personal Goals:  Personal Goals and Risk Factors at Admission - 03/26/20 1154      Core Components/Risk Factors/Patient Goals on Admission    Weight Management Yes;Weight Loss;Obesity    Intervention Weight Management: Develop a combined nutrition and exercise program designed to reach desired caloric intake, while maintaining appropriate intake of nutrient and fiber, sodium and fats, and appropriate energy expenditure required for the weight goal.;Weight Management: Provide education and appropriate resources to help participant work on and attain dietary goals.;Weight Management/Obesity: Establish reasonable short term and long term weight goals.;Obesity: Provide education and appropriate resources to help participant work on and attain dietary goals.    Admit Weight 231 lb 4.8 oz (104.9 kg)    Goal Weight: Short Term 225 lb (102.1 kg)    Goal Weight: Long Term 220 lb (99.8 kg)    Expected  Outcomes Long Term: Adherence to nutrition and physical activity/exercise program aimed toward attainment of established weight goal;Short Term: Continue to assess and modify interventions until short term weight is achieved;Weight Loss: Understanding of general recommendations for a balanced deficit meal plan, which promotes 1-2 lb weight loss per week and includes a negative energy balance of [phone] kcal/d;Understanding recommendations for meals to include 15-35% energy as protein, 25-35% energy from fat, 35-60% energy from carbohydrates, less than 200mg  of dietary cholesterol, 20-35 gm of total fiber daily;Understanding of distribution of calorie intake throughout the day with the consumption of 4-5 meals/snacks    Diabetes Yes    Intervention Provide education about signs/symptoms  and action to take for hypo/hyperglycemia.;Provide education about proper nutrition, including hydration, and aerobic/resistive exercise prescription along with prescribed medications to achieve blood glucose in normal ranges: Fasting glucose 65-99 mg/dL    Expected Outcomes Short Term: Participant verbalizes understanding of the signs/symptoms and immediate care of hyper/hypoglycemia, proper foot care and importance of medication, aerobic/resistive exercise and nutrition plan for blood glucose control.;Long Term: Attainment of HbA1C < 7%.    Hypertension Yes    Intervention Provide education on lifestyle modifcations including regular physical activity/exercise, weight management, moderate sodium restriction and increased consumption of fresh fruit, vegetables, and low fat dairy, alcohol moderation, and smoking cessation.;Monitor prescription use compliance.    Expected Outcomes Short Term: Continued assessment and intervention until BP is < 140/68mm HG in hypertensive participants. < 130/39mm HG in hypertensive participants with diabetes, heart failure or chronic kidney disease.;Long Term: Maintenance of blood pressure at goal levels.    Lipids Yes    Intervention Provide education and support for participant on nutrition & aerobic/resistive exercise along with prescribed medications to achieve LDL 70mg , HDL >40mg .    Expected Outcomes Short Term: Participant states understanding of desired cholesterol values and is compliant with medications prescribed. Participant is following exercise prescription and nutrition guidelines.;Long Term: Cholesterol controlled with medications as prescribed, with individualized exercise RX and with personalized nutrition plan. Value goals: LDL < 70mg , HDL > 40 mg.           Tobacco Use Initial Evaluation: Social History   Tobacco Use  Smoking Status Former Smoker  . Packs/day: 1.00  . Years: 30.00  . Pack years: 30.00  . Types: Cigarettes  . Quit date:  08/23/2007  . Years since quitting: 12.7  Smokeless Tobacco Never Used    Exercise Goals and Review:  Exercise Goals    Row Name 03/26/20 1151             Exercise Goals   Increase Physical Activity Yes       Intervention Provide advice, education, support and counseling about physical activity/exercise needs.;Develop an individualized exercise prescription for aerobic and resistive training based on initial evaluation findings, risk stratification, comorbidities and participant's personal goals.       Expected Outcomes Short Term: Attend rehab on a regular basis to increase amount of physical activity.;Long Term: Add in home exercise to make exercise part of routine and to increase amount of physical activity.;Long Term: Exercising regularly at least 3-5 days a week.       Increase Strength and Stamina Yes       Intervention Provide advice, education, support and counseling about physical activity/exercise needs.;Develop an individualized exercise prescription for aerobic and resistive training based on initial evaluation findings, risk stratification, comorbidities and participant's personal goals.       Expected Outcomes Short Term: Increase workloads from initial exercise  prescription for resistance, speed, and METs.;Short Term: Perform resistance training exercises routinely during rehab and add in resistance training at home;Long Term: Improve cardiorespiratory fitness, muscular endurance and strength as measured by increased METs and functional capacity (6MWT)       Able to understand and use rate of perceived exertion (RPE) scale Yes       Intervention Provide education and explanation on how to use RPE scale       Expected Outcomes Short Term: Able to use RPE daily in rehab to express subjective intensity level;Long Term:  Able to use RPE to guide intensity level when exercising independently       Able to understand and use Dyspnea scale Yes       Intervention Provide education and  explanation on how to use Dyspnea scale       Expected Outcomes Short Term: Able to use Dyspnea scale daily in rehab to express subjective sense of shortness of breath during exertion;Long Term: Able to use Dyspnea scale to guide intensity level when exercising independently       Knowledge and understanding of Target Heart Rate Range (THRR) Yes       Intervention Provide education and explanation of THRR including how the numbers were predicted and where they are located for reference       Expected Outcomes Short Term: Able to state/look up THRR;Short Term: Able to use daily as guideline for intensity in rehab;Long Term: Able to use THRR to govern intensity when exercising independently       Able to check pulse independently Yes       Intervention Provide education and demonstration on how to check pulse in carotid and radial arteries.;Review the importance of being able to check your own pulse for safety during independent exercise       Expected Outcomes Short Term: Able to explain why pulse checking is important during independent exercise;Long Term: Able to check pulse independently and accurately       Understanding of Exercise Prescription Yes       Intervention Provide education, explanation, and written materials on patient's individual exercise prescription       Expected Outcomes Short Term: Able to explain program exercise prescription;Long Term: Able to explain home exercise prescription to exercise independently              Copy of goals given to participant.

## 2020-05-07 NOTE — Progress Notes (Signed)
Cardiac Individual Treatment Plan  Patient Details  Name: Jeremy Dorsey MRN: [id number] Date of Birth: 05/30/1951 Referring Provider:     Cardiac Rehab from 05/07/2020 in [facility] Cardiac and Pulmonary Rehab  Referring Provider [name] MD      Initial Encounter Date:    Cardiac Rehab from 05/07/2020 in [facility] Cardiac and Pulmonary Rehab  Date 05/07/20      Visit Diagnosis: Status post coronary artery stent placement  Patient's Home Medications on Admission:  Current Outpatient Medications:  .  aspirin EC 81 MG EC tablet, Take 1 tablet (81 mg total) by mouth daily. Swallow whole., Disp: 30 tablet, Rfl: 11 .  atorvastatin (LIPITOR) 80 MG tablet, Take 1 tablet (80 mg total) by mouth daily., Disp: 90 tablet, Rfl: 1 .  calcitRIOL (ROCALTROL) 0.25 MCG capsule, Take 0.5 mcg by mouth daily. (Prescription instructions say to take 2 capsules by mouth daily), Disp: , Rfl:  .  clopidogrel (PLAVIX) 75 MG tablet, Take 1 tablet (75 mg total) by mouth daily with breakfast., Disp: 90 tablet, Rfl: 1 .  metoprolol tartrate (LOPRESSOR) 25 MG tablet, Take 1 tablet (25 mg total) by mouth 2 (two) times daily., Disp: 180 tablet, Rfl: 1 .  nitroGLYCERIN (NITROSTAT) 0.4 MG SL tablet, Place 1 tablet (0.4 mg total) under the tongue every 5 (five) minutes x 3 doses as needed for chest pain., Disp: 25 tablet, Rfl: 12 .  sitaGLIPtin (JANUVIA) 25 MG tablet, Take 1 tablet (25 mg total) by mouth daily., Disp: 30 tablet, Rfl: 3 .  sodium bicarbonate 650 MG tablet, Take 650 mg by mouth 2 (two) times daily. , Disp: , Rfl:  .  sodium zirconium cyclosilicate (LOKELMA) 10 g PACK packet, Take 10 g by mouth 3 (three) times a week., Disp: , Rfl:   Past Medical History: Past Medical History:  Diagnosis Date  . Anemia   . CKD (chronic kidney disease), stage IV ([place])   . Coronary artery disease    a. s/p IVUS-guided DESx2 to prox & mid LAD, residual disease treated medically. EF 50-55% by recent echo 02/2020.  . 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 Hyperlipidemia LDL goal <70   . Hypertension 2008  . Lower extremity edema   . Shoulder pain    Right  . Urinary complication     Tobacco Use: Social History   Tobacco Use  Smoking Status Former Smoker  . Packs/day: 1.00  . Years: 30.00  . Pack years: 30.00  . Types: Cigarettes  . Quit date: 08/23/2007  . Years since quitting: 12.7  Smokeless Tobacco Never Used    Labs: Recent Chemical engineer    Labs for ITP Cardiac and Pulmonary Rehab Latest Ref Rng & Units 08/11/2016 02/22/2017 06/10/2018 03/01/2020 03/02/2020   Cholestrol 0 - 200 mg/dL - - - 158 137   LDLCALC 0 - 99 mg/dL - - - 92 88   HDL >40 mg/dL - - - 31(L) 28(L)   Trlycerides <150 mg/dL - - - 173(H) 106   Hemoglobin A1c 4.8 - 5.6 % 8.8+% 9.1 8.0(A) 7.3(H) -       Exercise Target Goals: Exercise Program Goal: Individual exercise prescription set using results from initial 6 min walk test and THRR while considering  patient's activity barriers and safety.   Exercise Prescription Goal: Initial exercise prescription builds to 30-45 minutes a day of aerobic activity, 2-3 days per week.  Home exercise guidelines will be given to patient during program as part  of exercise prescription that the participant will acknowledge.   Education: Aerobic Exercise & Resistance Training: - Gives group verbal and written instruction on the various components of exercise. Focuses on aerobic and resistive training programs and the benefits of this training and how to safely progress through these programs..   Cardiac Rehab from 04/11/2020 in [facility] Cardiac and Pulmonary Rehab  Date 03/28/20  [resistance]  Educator [facility]  Instruction Review Code 1- Verbalizes Understanding      Education: Exercise & Equipment Safety: - Individual verbal instruction and demonstration of equipment use and safety with use of the equipment.   Cardiac Rehab from 04/11/2020 in [facility] Cardiac and Pulmonary Rehab  Date  03/21/20  Educator [facility]  Instruction Review Code 1- Verbalizes Understanding      Education: Exercise Physiology & General Exercise Guidelines: - Group verbal and written instruction with models to review the exercise physiology of the cardiovascular system and associated critical values. Provides general exercise guidelines with specific guidelines to those with heart or lung disease.    Cardiac Rehab from 04/11/2020 in [facility] Cardiac and Pulmonary Rehab  Date 03/26/20  Instruction Review Code 3- Needs Reinforcement  [need identified]      Education: Flexibility, Balance, Mind/Body Relaxation: Provides group verbal/written instruction on the benefits of flexibility and balance training, including mind/body exercise modes such as yoga, pilates and tai chi.  Demonstration and skill practice provided.   Activity Barriers & Risk Stratification:  Activity Barriers & Cardiac Risk Stratification - 05/07/20 1041      Activity Barriers & Cardiac Risk Stratification   Activity Barriers Joint Problems;Deconditioning;Muscular Weakness;Shortness of Breath;Balance Concerns;History of Falls;Other (comment)    Comments pain in legs with walking (claudication? no PAD Dx)    Cardiac Risk Stratification High           6 Minute Walk:  6 Minute Walk    Row Name 03/26/20 1142 05/07/20 1040       6 Minute Walk   Phase Initial Initial    Distance 1000 feet 925 feet    Walk Time 6 minutes 6 minutes    # of Rest Breaks 0 0    MPH 1.89 1.75    METS 2.04 1.87    RPE 12 13    Perceived Dyspnea  2 --    VO2 Peak 7.16 6.5    Symptoms Yes (comment) Yes (comment)    Comments pain in calves (throbbing), SOB (pulling in chest from mask) pain in calves 9/10, legs fatigued    Resting HR 64 bpm 73 bpm    Resting BP 132/74 128/60    Resting Oxygen Saturation  98 % 96 %    Exercise Oxygen Saturation  during 6 min walk 100 % 99 %    Max Ex. HR 94 bpm 99 bpm    Max Ex. BP 138/72 132/70    2 Minute Post BP  126/68 122/62           Oxygen Initial Assessment:   Oxygen Re-Evaluation:   Oxygen Discharge (Final Oxygen Re-Evaluation):   Initial Exercise Prescription:  Initial Exercise Prescription - 05/07/20 1000      Date of Initial Exercise RX and Referring Provider   Date 05/07/20    Referring Provider [name] MD      Treadmill   MPH 1.7    Grade 0    Minutes 15    METs 2.3      Recumbant Bike   Level 1    RPM  50    Watts 5    Minutes 15    METs 2      NuStep   Level 1    SPM 80    Minutes 15    METs 2      REL-XR   Level 1    Speed 50    Minutes 15    METs 2      Prescription Details   Frequency (times per week) 2    Duration Progress to 30 minutes of continuous aerobic without signs/symptoms of physical distress      Intensity   THRR 40-80% of Max Heartrate 104-135    Ratings of Perceived Exertion 11-13    Perceived Dyspnea 0-4      Progression   Progression Continue to progress workloads to maintain intensity without signs/symptoms of physical distress.      Resistance Training   Training Prescription Yes    Weight 3 lb    Reps 10-15           Perform Capillary Blood Glucose checks as needed.  Exercise Prescription Changes:  Exercise Prescription Changes    Row Name 03/26/20 1100 05/07/20 1000           Response to Exercise   Blood Pressure (Admit) 132/74 128/60      Blood Pressure (Exercise) 138/72 132/70      Blood Pressure (Exit) 126/68 122/62      Heart Rate (Admit) 64 bpm 73 bpm      Heart Rate (Exercise) 94 bpm 99 bpm      Heart Rate (Exit) 74 bpm 74 bpm      Oxygen Saturation (Admit) 98 % 96 %      Oxygen Saturation (Exercise) 100 % 99 %      Rating of Perceived Exertion (Exercise) 12 13      Perceived Dyspnea (Exercise) 2 --      Symptoms pain in calves, SOB legs fatigued, calve pain 9/10      Comments walk test results walk test results             Exercise Comments:  Exercise Comments    Row Name 04/11/20  1039           Exercise Comments Continues with his angina symptoms- he is aware of nitroglycerin use and calling 911 if meds do not control his symptoms Hehas called MD, is planning on changing cardiologists to have one closer to home.              Exercise Goals and Review:  Exercise Goals    Row Name 03/26/20 1151             Exercise Goals   Increase Physical Activity Yes       Intervention Provide advice, education, support and counseling about physical activity/exercise needs.;Develop an individualized exercise prescription for aerobic and resistive training based on initial evaluation findings, risk stratification, comorbidities and participant's personal goals.       Expected Outcomes Short Term: Attend rehab on a regular basis to increase amount of physical activity.;Long Term: Add in home exercise to make exercise part of routine and to increase amount of physical activity.;Long Term: Exercising regularly at least 3-5 days a week.       Increase Strength and Stamina Yes       Intervention Provide advice, education, support and counseling about physical activity/exercise needs.;Develop an individualized exercise prescription for aerobic and resistive training based on initial evaluation findings, risk  stratification, comorbidities and participant's personal goals.       Expected Outcomes Short Term: Increase workloads from initial exercise prescription for resistance, speed, and METs.;Short Term: Perform resistance training exercises routinely during rehab and add in resistance training at home;Long Term: Improve cardiorespiratory fitness, muscular endurance and strength as measured by increased METs and functional capacity (6MWT)       Able to understand and use rate of perceived exertion (RPE) scale Yes       Intervention Provide education and explanation on how to use RPE scale       Expected Outcomes Short Term: Able to use RPE daily in rehab to express subjective intensity  level;Long Term:  Able to use RPE to guide intensity level when exercising independently       Able to understand and use Dyspnea scale Yes       Intervention Provide education and explanation on how to use Dyspnea scale       Expected Outcomes Short Term: Able to use Dyspnea scale daily in rehab to express subjective sense of shortness of breath during exertion;Long Term: Able to use Dyspnea scale to guide intensity level when exercising independently       Knowledge and understanding of Target Heart Rate Range (THRR) Yes       Intervention Provide education and explanation of THRR including how the numbers were predicted and where they are located for reference       Expected Outcomes Short Term: Able to state/look up THRR;Short Term: Able to use daily as guideline for intensity in rehab;Long Term: Able to use THRR to govern intensity when exercising independently       Able to check pulse independently Yes       Intervention Provide education and demonstration on how to check pulse in carotid and radial arteries.;Review the importance of being able to check your own pulse for safety during independent exercise       Expected Outcomes Short Term: Able to explain why pulse checking is important during independent exercise;Long Term: Able to check pulse independently and accurately       Understanding of Exercise Prescription Yes       Intervention Provide education, explanation, and written materials on patient's individual exercise prescription       Expected Outcomes Short Term: Able to explain program exercise prescription;Long Term: Able to explain home exercise prescription to exercise independently              Exercise Goals Re-Evaluation :  Exercise Goals Re-Evaluation    [place] Name 03/28/20 1022 04/09/20 1604           Exercise Goal Re-Evaluation   Exercise Goals Review Increase Physical Activity;Able to understand and use rate of perceived exertion (RPE) scale;Knowledge and  understanding of Target Heart Rate Range (THRR);Understanding of Exercise Prescription;Increase Strength and Stamina;Able to check pulse independently Increase Physical Activity;Increase Strength and Stamina;Understanding of Exercise Prescription      Comments Reviewed RPE and dyspnea scales, THR and program prescription with pt today.  Pt voiced understanding and was given a copy of goals to take home. [name] was off to a good start in rehab, until the chest pain today.  Last week he went to the beach.  He has completed his first two full days of exercise.  We will continue to monitor his progress.      Expected Outcomes Short: Use RPE daily to regulate intensity. Long: Follow program prescription in THR. Short: Continue to attend rehab  regularly  Long: Continue to follow program prescription.             Discharge Exercise Prescription (Final Exercise Prescription Changes):  Exercise Prescription Changes - 05/07/20 1000      Response to Exercise   Blood Pressure (Admit) 128/60    Blood Pressure (Exercise) 132/70    Blood Pressure (Exit) 122/62    Heart Rate (Admit) 73 bpm    Heart Rate (Exercise) 99 bpm    Heart Rate (Exit) 74 bpm    Oxygen Saturation (Admit) 96 %    Oxygen Saturation (Exercise) 99 %    Rating of Perceived Exertion (Exercise) 13    Symptoms legs fatigued, calve pain 9/10    Comments walk test results           Nutrition:  Target Goals: Understanding of nutrition guidelines, daily intake of sodium <1556m, cholesterol <2064m calories 30% from fat and 7% or less from saturated fats, daily to have 5 or more servings of fruits and vegetables.  Education: Controlling Sodium/Reading Food Labels -Group verbal and written material supporting the discussion of sodium use in heart healthy nutrition. Review and explanation with models, verbal and written materials for utilization of the food label.   Education: General Nutrition Guidelines/Fats and Fiber: -Group instruction  provided by verbal, written material, models and posters to present the general guidelines for heart healthy nutrition. Gives an explanation and review of dietary fats and fiber.   [facility]  Date 04/11/20  [facility] Code 1- Verbalizes Understanding  [need identified]      Biometrics:  Pre Biometrics - 05/07/20 1044      Pre Biometrics   Height 5' 9.1" (1.755 m)    Weight 231 lb 6.4 oz (105 kg)    BMI (Calculated) 34.08    Single Leg Stand 5.2 seconds            Nutrition Therapy Plan and Nutrition Goals:   Nutrition Assessments:  Nutrition Assessments - 03/26/20 1153      MEDFICTS Scores   Pre Score 60           MEDIFICTS Score Key:          ?70 Need to make dietary changes          40-70 Heart Healthy Diet         ? 40 Therapeutic Level Cholesterol Diet  Nutrition Goals Re-Evaluation:   Nutrition Goals Discharge (Final Nutrition Goals Re-Evaluation):   Psychosocial: Target Goals: Acknowledge presence or absence of significant depression and/or stress, maximize coping skills, provide positive support system. Participant is able to verbalize types and ability to use techniques and skills needed for reducing stress and depression.   Education: Depression - Provides group verbal and written instruction on the correlation between heart/lung disease and depressed mood, treatment options, and the stigmas associated with seeking treatment.   Education: Sleep Hygiene -Provides group verbal and written instruction about how sleep can affect your health.  Define sleep hygiene, discuss sleep cycles and impact of sleep habits. Review good sleep hygiene tips.     Education: Stress and Anxiety: - Provides group verbal and written instruction about the health risks of elevated stress and causes of high stress.  Discuss the correlation between heart/lung disease and anxiety and treatment options.  Review healthy ways to manage with stress and anxiety.    Initial Review & Psychosocial Screening:  Initial Psych Review & Screening -  03/21/20 0928      Initial Review   Current issues with Current Sleep Concerns      Family Dynamics   Good Support System? Yes    Comments He can look to his wife, son and daughters for support. He has a hard time sleeping because he has a fluid pill that he takes daily.      Barriers   Psychosocial barriers to participate in program The patient should benefit from training in stress management and relaxation.      Screening Interventions   Interventions Encouraged to exercise;Provide feedback about the scores to participant;To provide support and resources with identified psychosocial needs    Expected Outcomes Short Term goal: Utilizing psychosocial counselor, staff and physician to assist with identification of specific Stressors or current issues interfering with healing process. Setting desired goal for each stressor or current issue identified.;Long Term Goal: Stressors or current issues are controlled or eliminated.;Short Term goal: Identification and review with participant of any Quality of Life or Depression concerns found by scoring the questionnaire.;Long Term goal: The participant improves quality of Life and PHQ9 Scores as seen by post scores and/or verbalization of changes           Quality of Life Scores:   Quality of Life - 03/26/20 1153      Quality of Life   Select Quality of Life      Quality of Life Scores   Health/Function Pre 14.43 %    Socioeconomic Pre 23.07 %    Psych/Spiritual Pre 19.43 %    Family Pre 25.9 %    GLOBAL Pre 28.93 %          Scores of 19 and below usually indicate a poorer quality of life in these areas.  A difference of  2-3 points is a clinically meaningful difference.  A difference of 2-3 points in the total score of the Quality of Life Index has been associated with significant improvement in  overall quality of life, self-image, physical symptoms, and general health in studies assessing change in quality of life.  PHQ-9: Recent Review Flowsheet Data    Depression screen [facility] 2/9 05/07/2020 03/26/2020 05/24/2019   Decreased Interest 0 1 0   Down, Depressed, Hopeless 0 1 0   PHQ - 2 Score 0 2 0   Altered sleeping 1 3 -   Tired, decreased energy 1 2 -   Change in appetite 1 3 -   Feeling bad or failure about yourself  0 0 -   Trouble concentrating 0 0 -   Moving slowly or fidgety/restless 0 0 -   Suicidal thoughts 0 0 -   PHQ-9 Score 3 10 -   Difficult doing work/chores Not difficult at all Not difficult at all -     Interpretation of Total Score  Total Score Depression Severity:  1-4 = Minimal depression, 5-9 = Mild depression, 10-14 = Moderate depression, 15-19 = Moderately severe depression, 20-27 = Severe depression   Psychosocial Evaluation and Intervention:  Psychosocial Evaluation - 03/21/20 0929      Psychosocial Evaluation & Interventions   Interventions Encouraged to exercise with the program and follow exercise prescription    Comments He can look to his wife, son and daughters for support. He has a hard time sleeping because he has a fluid pill that he takes daily.    Expected Outcomes Short: Exercise regularly to support mental health and notify staff of any changes. Long: maintain mental health and  well being through teaching of rehab or prescribed medications independently.    Continue Psychosocial Services  Follow up required by staff           Psychosocial Re-Evaluation:   Psychosocial Discharge (Final Psychosocial Re-Evaluation):   Vocational Rehabilitation: Provide vocational rehab assistance to qualifying candidates.   Vocational Rehab Evaluation & Intervention:   Education: Education Goals: Education classes will be provided on a variety of topics geared toward better understanding of heart health and risk factor modification. Participant  will state understanding/return demonstration of topics presented as noted by education test scores.  Learning Barriers/Preferences:  Learning Barriers/Preferences - 03/21/20 0926      Learning Barriers/Preferences   Learning Barriers None    Learning Preferences None           General Cardiac Education Topics:  AED/CPR: - Group verbal and written instruction with the use of models to demonstrate the basic use of the AED with the basic ABC's of resuscitation.   Anatomy & Physiology of the Heart: - Group verbal and written instruction and models provide basic cardiac anatomy and physiology, with the coronary electrical and arterial systems. Review of Valvular disease and Heart Failure   Cardiac Rehab from 04/11/2020 in [facility] Cardiac and Pulmonary Rehab  Date 03/26/20  Instruction Review Code 3- Needs Reinforcement  [need identified]      Cardiac Procedures: - Group verbal and written instruction to review commonly prescribed medications for heart disease. Reviews the medication, class of the drug, and side effects. Includes the steps to properly store meds and maintain the prescription regimen. (beta blockers and nitrates)   Cardiac Rehab from 04/11/2020 in [facility] Cardiac and Pulmonary Rehab  Date 03/28/20  Educator SB  Instruction Review Code 1- Verbalizes Understanding      Cardiac Medications I: - Group verbal and written instruction to review commonly prescribed medications for heart disease. Reviews the medication, class of the drug, and side effects. Includes the steps to properly store meds and maintain the prescription regimen.   Cardiac Medications II: -Group verbal and written instruction to review commonly prescribed medications for heart disease. Reviews the medication, class of the drug, and side effects. (all other drug classes)   Cardiac Rehab from 04/11/2020 in [facility] Cardiac and Pulmonary Rehab  Date 03/26/20  Instruction Review Code 3- Needs Reinforcement  [need  identified]       Go Sex-Intimacy & Heart Disease, Get SMART - Goal Setting: - Group verbal and written instruction through game format to discuss heart disease and the return to sexual intimacy. Provides group verbal and written material to discuss and apply goal setting through the application of the S.M.A.R.T. Method.   Cardiac Rehab from 04/11/2020 in [facility] Cardiac and Pulmonary Rehab  Date 03/28/20  Educator SB  Instruction Review Code 1- Verbalizes Understanding      Other Matters of the Heart: - Provides group verbal, written materials and models to describe Stable Angina and Peripheral Artery. Includes description of the disease process and treatment options available to the cardiac patient.   Infection Prevention: - Provides verbal and written material to individual with discussion of infection control including proper hand washing and proper equipment cleaning during exercise session.   Cardiac Rehab from 04/11/2020 in [facility] Cardiac and Pulmonary Rehab  Date 03/21/20  Educator [facility]  Instruction Review Code 1- Verbalizes Understanding      Falls Prevention: - Provides verbal and written material to individual with discussion of falls prevention and safety.   Cardiac Rehab  from 04/11/2020 in [facility] Cardiac and Pulmonary Rehab  Date 03/21/20  Educator [facility]  Instruction Review Code 1- Verbalizes Understanding      Other: -Provides group and verbal instruction on various topics (see comments)   Knowledge Questionnaire Score:  Knowledge Questionnaire Score - 03/26/20 1153      Knowledge Questionnaire Score   Pre Score 20/26 Education Focus: Nutrition, Exercies, Anatomy, Heart Failure           Core Components/Risk Factors/Patient Goals at Admission:  Personal Goals and Risk Factors at Admission - 03/26/20 1154      Core Components/Risk Factors/Patient Goals on Admission    Weight Management Yes;Weight Loss;Obesity    Intervention Weight Management: Develop a combined  nutrition and exercise program designed to reach desired caloric intake, while maintaining appropriate intake of nutrient and fiber, sodium and fats, and appropriate energy expenditure required for the weight goal.;Weight Management: Provide education and appropriate resources to help participant work on and attain dietary goals.;Weight Management/Obesity: Establish reasonable short term and long term weight goals.;Obesity: Provide education and appropriate resources to help participant work on and attain dietary goals.    Admit Weight 231 lb 4.8 oz (104.9 kg)    Goal Weight: Short Term 225 lb (102.1 kg)    Goal Weight: Long Term 220 lb (99.8 kg)    Expected Outcomes Long Term: Adherence to nutrition and physical activity/exercise program aimed toward attainment of established weight goal;Short Term: Continue to assess and modify interventions until short term weight is achieved;Weight Loss: Understanding of general recommendations for a balanced deficit meal plan, which promotes 1-2 lb weight loss per week and includes a negative energy balance of [phone] kcal/d;Understanding recommendations for meals to include 15-35% energy as protein, 25-35% energy from fat, 35-60% energy from carbohydrates, less than 243m of dietary cholesterol, 20-35 gm of total fiber daily;Understanding of distribution of calorie intake throughout the day with the consumption of 4-5 meals/snacks    Diabetes Yes    Intervention Provide education about signs/symptoms and action to take for hypo/hyperglycemia.;Provide education about proper nutrition, including hydration, and aerobic/resistive exercise prescription along with prescribed medications to achieve blood glucose in normal ranges: Fasting glucose 65-99 mg/dL    Expected Outcomes Short Term: Participant verbalizes understanding of the signs/symptoms and immediate care of hyper/hypoglycemia, proper foot care and importance of medication, aerobic/resistive exercise and nutrition  plan for blood glucose control.;Long Term: Attainment of HbA1C < 7%.    Hypertension Yes    Intervention Provide education on lifestyle modifcations including regular physical activity/exercise, weight management, moderate sodium restriction and increased consumption of fresh fruit, vegetables, and low fat dairy, alcohol moderation, and smoking cessation.;Monitor prescription use compliance.    Expected Outcomes Short Term: Continued assessment and intervention until BP is < 140/935mHG in hypertensive participants. < 130/8082mG in hypertensive participants with diabetes, heart failure or chronic kidney disease.;Long Term: Maintenance of blood pressure at goal levels.    Lipids Yes    Intervention Provide education and support for participant on nutrition & aerobic/resistive exercise along with prescribed medications to achieve LDL <42m32mDL >40mg1m Expected Outcomes Short Term: Participant states understanding of desired cholesterol values and is compliant with medications prescribed. Participant is following exercise prescription and nutrition guidelines.;Long Term: Cholesterol controlled with medications as prescribed, with individualized exercise RX and with personalized nutrition plan. Value goals: LDL < 42mg,85m > 40 mg.           Education:Diabetes - Individual verbal and written instruction  to review signs/symptoms of diabetes, desired ranges of glucose level fasting, after meals and with exercise. Acknowledge that pre and post exercise glucose checks will be done for 3 sessions at entry of program.   Cardiac Rehab from 04/11/2020 in [facility]  Date 03/21/20  Educator [facility]  Instruction Review Code 1- Verbalizes Understanding      Education: Know Your Numbers and Risk Factors: -Group verbal and written instruction about important numbers in your health.  Discussion of what are risk factors and how they play a role in the disease process.  Review of Cholesterol,  Blood Pressure, Diabetes, and BMI and the role they play in your overall health.   Cardiac Rehab from 04/11/2020 in [facility]  Date 03/26/20  Instruction Review Code 3- Needs Reinforcement  [need identified]      Core Components/Risk Factors/Patient Goals Review:    Core Components/Risk Factors/Patient Goals at Discharge (Final Review):    ITP Comments:  ITP Comments    Row Name 03/21/20 0931 03/26/20 1142 03/28/20 1022 04/10/20 1423 04/11/20 1038   ITP Comments Virtual Visit completed. Patient informed on EP and RD appointment and 6 Minute walk test. Patient also informed of patient health questionnaires on My Chart. Patient Verbalizes understanding. Visit diagnosis can be found in [facility] 02/29/2020. Completed 6MWT and gym orientation. Initial ITP created and sent for review to Dr. Emily Filbert, Medical Director. First full day of exercise!  Patient was oriented to gym and equipment including functions, settings, policies, and procedures.  Patient's individual exercise prescription and treatment plan were reviewed.  All starting workloads were established based on the results of the 6 minute walk test done at initial orientation visit.  The plan for exercise progression was also introduced and progression will be customized based on patient's performance and goals. 30 day review completed. ITP sent to Dr. Emily Filbert, Medical Director of Cardiac and Pulmonary Rehab. Continue with ITP unless changes are made by physician. Continues with his angina symptoms- he is aware of nitroglycerin use and calling 911 if meds do not control his symptoms Hehas called MD, is planning on changing cardiologists to have one closer to home.   [place] Name 04/16/20 0908 04/25/20 1306 05/07/20 1040       ITP Comments Doug called to let us know that he has had four episodes since last attending. He has called his doctor and they encouraged him to go to the emergency room.  He was hesitant about this so we  talked about urgent care options as he does need to be seen.  He will get seen somewhere and will keep [place] updated. [name] has had a new stent placed and now seeing a new cardiologist.  He is out on hold until he follows up in the office prior to restarting rehab.  We will discharge him under the NSTEMI order and bring him back with his new stent with Dr. Rockey Situ once cleared to return. Completed 6MWT and gym orientation. Initial ITP created and sent for review to Dr. Emily Filbert, Medical Director.  Restarting with new stents and MD.            Comments: Initial ITP

## 2020-05-08 ENCOUNTER — Encounter: Payer: Self-pay | Admitting: *Deleted

## 2020-05-08 DIAGNOSIS — D0439 Carcinoma in situ of skin of other parts of face: Secondary | ICD-10-CM | POA: Diagnosis not present

## 2020-05-08 DIAGNOSIS — Z85828 Personal history of other malignant neoplasm of skin: Secondary | ICD-10-CM | POA: Diagnosis not present

## 2020-05-08 DIAGNOSIS — Z955 Presence of coronary angioplasty implant and graft: Secondary | ICD-10-CM

## 2020-05-08 DIAGNOSIS — L814 Other melanin hyperpigmentation: Secondary | ICD-10-CM | POA: Diagnosis not present

## 2020-05-08 DIAGNOSIS — L988 Other specified disorders of the skin and subcutaneous tissue: Secondary | ICD-10-CM | POA: Diagnosis not present

## 2020-05-08 DIAGNOSIS — L578 Other skin changes due to chronic exposure to nonionizing radiation: Secondary | ICD-10-CM | POA: Diagnosis not present

## 2020-05-08 NOTE — Progress Notes (Signed)
Cardiac Individual Treatment Plan  Patient Details  Name: Jeremy Dorsey MRN: [id number] Date of Birth: 05/30/1951 Referring Provider:     Cardiac Rehab from 05/07/2020 in [facility] Cardiac and Pulmonary Rehab  Referring Provider [name] MD      Initial Encounter Date:    Cardiac Rehab from 05/07/2020 in [facility] Cardiac and Pulmonary Rehab  Date 05/07/20      Visit Diagnosis: Status post coronary artery stent placement  Patient's Home Medications on Admission:  Current Outpatient Medications:  .  aspirin EC 81 MG EC tablet, Take 1 tablet (81 mg total) by mouth daily. Swallow whole., Disp: 30 tablet, Rfl: 11 .  atorvastatin (LIPITOR) 80 MG tablet, Take 1 tablet (80 mg total) by mouth daily., Disp: 90 tablet, Rfl: 1 .  calcitRIOL (ROCALTROL) 0.25 MCG capsule, Take 0.5 mcg by mouth daily. (Prescription instructions say to take 2 capsules by mouth daily), Disp: , Rfl:  .  clopidogrel (PLAVIX) 75 MG tablet, Take 1 tablet (75 mg total) by mouth daily with breakfast., Disp: 90 tablet, Rfl: 1 .  metoprolol tartrate (LOPRESSOR) 25 MG tablet, Take 1 tablet (25 mg total) by mouth 2 (two) times daily., Disp: 180 tablet, Rfl: 1 .  nitroGLYCERIN (NITROSTAT) 0.4 MG SL tablet, Place 1 tablet (0.4 mg total) under the tongue every 5 (five) minutes x 3 doses as needed for chest pain., Disp: 25 tablet, Rfl: 12 .  sitaGLIPtin (JANUVIA) 25 MG tablet, Take 1 tablet (25 mg total) by mouth daily., Disp: 30 tablet, Rfl: 3 .  sodium bicarbonate 650 MG tablet, Take 650 mg by mouth 2 (two) times daily. , Disp: , Rfl:  .  sodium zirconium cyclosilicate (LOKELMA) 10 g PACK packet, Take 10 g by mouth 3 (three) times a week., Disp: , Rfl:   Past Medical History: Past Medical History:  Diagnosis Date  . Anemia   . CKD (chronic kidney disease), stage IV ([place])   . Coronary artery disease    a. s/p IVUS-guided DESx2 to prox & mid LAD, residual disease treated medically. EF 50-55% by recent echo 02/2020.  . 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 Hyperlipidemia LDL goal <70   . Hypertension 2008  . Lower extremity edema   . Shoulder pain    Right  . Urinary complication     Tobacco Use: Social History   Tobacco Use  Smoking Status Former Smoker  . Packs/day: 1.00  . Years: 30.00  . Pack years: 30.00  . Types: Cigarettes  . Quit date: 08/23/2007  . Years since quitting: 12.7  Smokeless Tobacco Never Used    Labs: Recent Chemical engineer    Labs for ITP Cardiac and Pulmonary Rehab Latest Ref Rng & Units 08/11/2016 02/22/2017 06/10/2018 03/01/2020 03/02/2020   Cholestrol 0 - 200 mg/dL - - - 158 137   LDLCALC 0 - 99 mg/dL - - - 92 88   HDL >40 mg/dL - - - 31(L) 28(L)   Trlycerides <150 mg/dL - - - 173(H) 106   Hemoglobin A1c 4.8 - 5.6 % 8.8+% 9.1 8.0(A) 7.3(H) -       Exercise Target Goals: Exercise Program Goal: Individual exercise prescription set using results from initial 6 min walk test and THRR while considering  patient's activity barriers and safety.   Exercise Prescription Goal: Initial exercise prescription builds to 30-45 minutes a day of aerobic activity, 2-3 days per week.  Home exercise guidelines will be given to patient during program as part  of exercise prescription that the participant will acknowledge.   Education: Aerobic Exercise & Resistance Training: - Gives group verbal and written instruction on the various components of exercise. Focuses on aerobic and resistive training programs and the benefits of this training and how to safely progress through these programs..   Cardiac Rehab from 04/11/2020 in [facility] Cardiac and Pulmonary Rehab  Date 03/28/20  [resistance]  Educator [facility]  Instruction Review Code 1- Verbalizes Understanding      Education: Exercise & Equipment Safety: - Individual verbal instruction and demonstration of equipment use and safety with use of the equipment.   Cardiac Rehab from 04/11/2020 in [facility] Cardiac and Pulmonary Rehab  Date  03/21/20  Educator [facility]  Instruction Review Code 1- Verbalizes Understanding      Education: Exercise Physiology & General Exercise Guidelines: - Group verbal and written instruction with models to review the exercise physiology of the cardiovascular system and associated critical values. Provides general exercise guidelines with specific guidelines to those with heart or lung disease.    Cardiac Rehab from 04/11/2020 in [facility] Cardiac and Pulmonary Rehab  Date 03/26/20  Instruction Review Code 3- Needs Reinforcement  [need identified]      Education: Flexibility, Balance, Mind/Body Relaxation: Provides group verbal/written instruction on the benefits of flexibility and balance training, including mind/body exercise modes such as yoga, pilates and tai chi.  Demonstration and skill practice provided.   Activity Barriers & Risk Stratification:  Activity Barriers & Cardiac Risk Stratification - 05/07/20 1041      Activity Barriers & Cardiac Risk Stratification   Activity Barriers Joint Problems;Deconditioning;Muscular Weakness;Shortness of Breath;Balance Concerns;History of Falls;Other (comment)    Comments pain in legs with walking (claudication? no PAD Dx)    Cardiac Risk Stratification High           6 Minute Walk:  6 Minute Walk    Row Name 03/26/20 1142 05/07/20 1040       6 Minute Walk   Phase Initial Initial    Distance 1000 feet 925 feet    Walk Time 6 minutes 6 minutes    # of Rest Breaks 0 0    MPH 1.89 1.75    METS 2.04 1.87    RPE 12 13    Perceived Dyspnea  2 --    VO2 Peak 7.16 6.5    Symptoms Yes (comment) Yes (comment)    Comments pain in calves (throbbing), SOB (pulling in chest from mask) pain in calves 9/10, legs fatigued    Resting HR 64 bpm 73 bpm    Resting BP 132/74 128/60    Resting Oxygen Saturation  98 % 96 %    Exercise Oxygen Saturation  during 6 min walk 100 % 99 %    Max Ex. HR 94 bpm 99 bpm    Max Ex. BP 138/72 132/70    2 Minute Post BP  126/68 122/62           Oxygen Initial Assessment:   Oxygen Re-Evaluation:   Oxygen Discharge (Final Oxygen Re-Evaluation):   Initial Exercise Prescription:  Initial Exercise Prescription - 05/07/20 1000      Date of Initial Exercise RX and Referring Provider   Date 05/07/20    Referring Provider [name] MD      Treadmill   MPH 1.7    Grade 0    Minutes 15    METs 2.3      Recumbant Bike   Level 1    RPM  50    Watts 5    Minutes 15    METs 2      NuStep   Level 1    SPM 80    Minutes 15    METs 2      REL-XR   Level 1    Speed 50    Minutes 15    METs 2      Prescription Details   Frequency (times per week) 2    Duration Progress to 30 minutes of continuous aerobic without signs/symptoms of physical distress      Intensity   THRR 40-80% of Max Heartrate 104-135    Ratings of Perceived Exertion 11-13    Perceived Dyspnea 0-4      Progression   Progression Continue to progress workloads to maintain intensity without signs/symptoms of physical distress.      Resistance Training   Training Prescription Yes    Weight 3 lb    Reps 10-15           Perform Capillary Blood Glucose checks as needed.  Exercise Prescription Changes:  Exercise Prescription Changes    Row Name 03/26/20 1100 05/07/20 1000           Response to Exercise   Blood Pressure (Admit) 132/74 128/60      Blood Pressure (Exercise) 138/72 132/70      Blood Pressure (Exit) 126/68 122/62      Heart Rate (Admit) 64 bpm 73 bpm      Heart Rate (Exercise) 94 bpm 99 bpm      Heart Rate (Exit) 74 bpm 74 bpm      Oxygen Saturation (Admit) 98 % 96 %      Oxygen Saturation (Exercise) 100 % 99 %      Rating of Perceived Exertion (Exercise) 12 13      Perceived Dyspnea (Exercise) 2 --      Symptoms pain in calves, SOB legs fatigued, calve pain 9/10      Comments walk test results walk test results             Exercise Comments:  Exercise Comments    Row Name 04/11/20  1039           Exercise Comments Continues with his angina symptoms- he is aware of nitroglycerin use and calling 911 if meds do not control his symptoms Hehas called MD, is planning on changing cardiologists to have one closer to home.              Exercise Goals and Review:  Exercise Goals    Row Name 03/26/20 1151             Exercise Goals   Increase Physical Activity Yes       Intervention Provide advice, education, support and counseling about physical activity/exercise needs.;Develop an individualized exercise prescription for aerobic and resistive training based on initial evaluation findings, risk stratification, comorbidities and participant's personal goals.       Expected Outcomes Short Term: Attend rehab on a regular basis to increase amount of physical activity.;Long Term: Add in home exercise to make exercise part of routine and to increase amount of physical activity.;Long Term: Exercising regularly at least 3-5 days a week.       Increase Strength and Stamina Yes       Intervention Provide advice, education, support and counseling about physical activity/exercise needs.;Develop an individualized exercise prescription for aerobic and resistive training based on initial evaluation findings, risk  stratification, comorbidities and participant's personal goals.       Expected Outcomes Short Term: Increase workloads from initial exercise prescription for resistance, speed, and METs.;Short Term: Perform resistance training exercises routinely during rehab and add in resistance training at home;Long Term: Improve cardiorespiratory fitness, muscular endurance and strength as measured by increased METs and functional capacity (6MWT)       Able to understand and use rate of perceived exertion (RPE) scale Yes       Intervention Provide education and explanation on how to use RPE scale       Expected Outcomes Short Term: Able to use RPE daily in rehab to express subjective intensity  level;Long Term:  Able to use RPE to guide intensity level when exercising independently       Able to understand and use Dyspnea scale Yes       Intervention Provide education and explanation on how to use Dyspnea scale       Expected Outcomes Short Term: Able to use Dyspnea scale daily in rehab to express subjective sense of shortness of breath during exertion;Long Term: Able to use Dyspnea scale to guide intensity level when exercising independently       Knowledge and understanding of Target Heart Rate Range (THRR) Yes       Intervention Provide education and explanation of THRR including how the numbers were predicted and where they are located for reference       Expected Outcomes Short Term: Able to state/look up THRR;Short Term: Able to use daily as guideline for intensity in rehab;Long Term: Able to use THRR to govern intensity when exercising independently       Able to check pulse independently Yes       Intervention Provide education and demonstration on how to check pulse in carotid and radial arteries.;Review the importance of being able to check your own pulse for safety during independent exercise       Expected Outcomes Short Term: Able to explain why pulse checking is important during independent exercise;Long Term: Able to check pulse independently and accurately       Understanding of Exercise Prescription Yes       Intervention Provide education, explanation, and written materials on patient's individual exercise prescription       Expected Outcomes Short Term: Able to explain program exercise prescription;Long Term: Able to explain home exercise prescription to exercise independently              Exercise Goals Re-Evaluation :  Exercise Goals Re-Evaluation    [place] Name 03/28/20 1022 04/09/20 1604           Exercise Goal Re-Evaluation   Exercise Goals Review Increase Physical Activity;Able to understand and use rate of perceived exertion (RPE) scale;Knowledge and  understanding of Target Heart Rate Range (THRR);Understanding of Exercise Prescription;Increase Strength and Stamina;Able to check pulse independently Increase Physical Activity;Increase Strength and Stamina;Understanding of Exercise Prescription      Comments Reviewed RPE and dyspnea scales, THR and program prescription with pt today.  Pt voiced understanding and was given a copy of goals to take home. Jeremy Dorsey was off to a good start in rehab, until the chest pain today.  Last week he went to the beach.  He has completed his first two full days of exercise.  We will continue to monitor his progress.      Expected Outcomes Short: Use RPE daily to regulate intensity. Long: Follow program prescription in THR. Short: Continue to attend rehab  regularly  Long: Continue to follow program prescription.             Discharge Exercise Prescription (Final Exercise Prescription Changes):  Exercise Prescription Changes - 05/07/20 1000      Response to Exercise   Blood Pressure (Admit) 128/60    Blood Pressure (Exercise) 132/70    Blood Pressure (Exit) 122/62    Heart Rate (Admit) 73 bpm    Heart Rate (Exercise) 99 bpm    Heart Rate (Exit) 74 bpm    Oxygen Saturation (Admit) 96 %    Oxygen Saturation (Exercise) 99 %    Rating of Perceived Exertion (Exercise) 13    Symptoms legs fatigued, calve pain 9/10    Comments walk test results           Nutrition:  Target Goals: Understanding of nutrition guidelines, daily intake of sodium <1556m, cholesterol <2064m calories 30% from fat and 7% or less from saturated fats, daily to have 5 or more servings of fruits and vegetables.  Education: Controlling Sodium/Reading Food Labels -Group verbal and written material supporting the discussion of sodium use in heart healthy nutrition. Review and explanation with models, verbal and written materials for utilization of the food label.   Education: General Nutrition Guidelines/Fats and Fiber: -Group instruction  provided by verbal, written material, models and posters to present the general guidelines for heart healthy nutrition. Gives an explanation and review of dietary fats and fiber.   [facility]  Date 04/11/20  [facility] Code 1- Verbalizes Understanding  [need identified]      Biometrics:  Pre Biometrics - 05/07/20 1044      Pre Biometrics   Height 5' 9.1" (1.755 m)    Weight 231 lb 6.4 oz (105 kg)    BMI (Calculated) 34.08    Single Leg Stand 5.2 seconds            Nutrition Therapy Plan and Nutrition Goals:   Nutrition Assessments:  Nutrition Assessments - 03/26/20 1153      MEDFICTS Scores   Pre Score 60           MEDIFICTS Score Key:          ?70 Need to make dietary changes          40-70 Heart Healthy Diet         ? 40 Therapeutic Level Cholesterol Diet  Nutrition Goals Re-Evaluation:   Nutrition Goals Discharge (Final Nutrition Goals Re-Evaluation):   Psychosocial: Target Goals: Acknowledge presence or absence of significant depression and/or stress, maximize coping skills, provide positive support system. Participant is able to verbalize types and ability to use techniques and skills needed for reducing stress and depression.   Education: Depression - Provides group verbal and written instruction on the correlation between heart/lung disease and depressed mood, treatment options, and the stigmas associated with seeking treatment.   Education: Sleep Hygiene -Provides group verbal and written instruction about how sleep can affect your health.  Define sleep hygiene, discuss sleep cycles and impact of sleep habits. Review good sleep hygiene tips.     Education: Stress and Anxiety: - Provides group verbal and written instruction about the health risks of elevated stress and causes of high stress.  Discuss the correlation between heart/lung disease and anxiety and treatment options.  Review healthy ways to manage with stress and anxiety.    Initial Review & Psychosocial Screening:  Initial Psych Review & Screening -  03/21/20 0928      Initial Review   Current issues with Current Sleep Concerns      Family Dynamics   Good Support System? Yes    Comments He can look to his wife, son and daughters for support. He has a hard time sleeping because he has a fluid pill that he takes daily.      Barriers   Psychosocial barriers to participate in program The patient should benefit from training in stress management and relaxation.      Screening Interventions   Interventions Encouraged to exercise;Provide feedback about the scores to participant;To provide support and resources with identified psychosocial needs    Expected Outcomes Short Term goal: Utilizing psychosocial counselor, staff and physician to assist with identification of specific Stressors or current issues interfering with healing process. Setting desired goal for each stressor or current issue identified.;Long Term Goal: Stressors or current issues are controlled or eliminated.;Short Term goal: Identification and review with participant of any Quality of Life or Depression concerns found by scoring the questionnaire.;Long Term goal: The participant improves quality of Life and PHQ9 Scores as seen by post scores and/or verbalization of changes           Quality of Life Scores:   Quality of Life - 03/26/20 1153      Quality of Life   Select Quality of Life      Quality of Life Scores   Health/Function Pre 14.43 %    Socioeconomic Pre 23.07 %    Psych/Spiritual Pre 19.43 %    Family Pre 25.9 %    GLOBAL Pre 28.93 %          Scores of 19 and below usually indicate a poorer quality of life in these areas.  A difference of  2-3 points is a clinically meaningful difference.  A difference of 2-3 points in the total score of the Quality of Life Index has been associated with significant improvement in  overall quality of life, self-image, physical symptoms, and general health in studies assessing change in quality of life.  PHQ-9: Recent Review Flowsheet Data    Depression screen [facility] 2/9 05/07/2020 03/26/2020 05/24/2019   Decreased Interest 0 1 0   Down, Depressed, Hopeless 0 1 0   PHQ - 2 Score 0 2 0   Altered sleeping 1 3 -   Tired, decreased energy 1 2 -   Change in appetite 1 3 -   Feeling bad or failure about yourself  0 0 -   Trouble concentrating 0 0 -   Moving slowly or fidgety/restless 0 0 -   Suicidal thoughts 0 0 -   PHQ-9 Score 3 10 -   Difficult doing work/chores Not difficult at all Not difficult at all -     Interpretation of Total Score  Total Score Depression Severity:  1-4 = Minimal depression, 5-9 = Mild depression, 10-14 = Moderate depression, 15-19 = Moderately severe depression, 20-27 = Severe depression   Psychosocial Evaluation and Intervention:  Psychosocial Evaluation - 03/21/20 0929      Psychosocial Evaluation & Interventions   Interventions Encouraged to exercise with the program and follow exercise prescription    Comments He can look to his wife, son and daughters for support. He has a hard time sleeping because he has a fluid pill that he takes daily.    Expected Outcomes Short: Exercise regularly to support mental health and notify staff of any changes. Long: maintain mental health and  well being through teaching of rehab or prescribed medications independently.    Continue Psychosocial Services  Follow up required by staff           Psychosocial Re-Evaluation:   Psychosocial Discharge (Final Psychosocial Re-Evaluation):   Vocational Rehabilitation: Provide vocational rehab assistance to qualifying candidates.   Vocational Rehab Evaluation & Intervention:   Education: Education Goals: Education classes will be provided on a variety of topics geared toward better understanding of heart health and risk factor modification. Participant  will state understanding/return demonstration of topics presented as noted by education test scores.  Learning Barriers/Preferences:  Learning Barriers/Preferences - 03/21/20 0926      Learning Barriers/Preferences   Learning Barriers None    Learning Preferences None           General Cardiac Education Topics:  AED/CPR: - Group verbal and written instruction with the use of models to demonstrate the basic use of the AED with the basic ABC's of resuscitation.   Anatomy & Physiology of the Heart: - Group verbal and written instruction and models provide basic cardiac anatomy and physiology, with the coronary electrical and arterial systems. Review of Valvular disease and Heart Failure   Cardiac Rehab from 04/11/2020 in [facility] Cardiac and Pulmonary Rehab  Date 03/26/20  Instruction Review Code 3- Needs Reinforcement  [need identified]      Cardiac Procedures: - Group verbal and written instruction to review commonly prescribed medications for heart disease. Reviews the medication, class of the drug, and side effects. Includes the steps to properly store meds and maintain the prescription regimen. (beta blockers and nitrates)   Cardiac Rehab from 04/11/2020 in [facility] Cardiac and Pulmonary Rehab  Date 03/28/20  Educator SB  Instruction Review Code 1- Verbalizes Understanding      Cardiac Medications I: - Group verbal and written instruction to review commonly prescribed medications for heart disease. Reviews the medication, class of the drug, and side effects. Includes the steps to properly store meds and maintain the prescription regimen.   Cardiac Medications II: -Group verbal and written instruction to review commonly prescribed medications for heart disease. Reviews the medication, class of the drug, and side effects. (all other drug classes)   Cardiac Rehab from 04/11/2020 in [facility] Cardiac and Pulmonary Rehab  Date 03/26/20  Instruction Review Code 3- Needs Reinforcement  [need  identified]       Go Sex-Intimacy & Heart Disease, Get SMART - Goal Setting: - Group verbal and written instruction through game format to discuss heart disease and the return to sexual intimacy. Provides group verbal and written material to discuss and apply goal setting through the application of the S.M.A.R.T. Method.   Cardiac Rehab from 04/11/2020 in [facility] Cardiac and Pulmonary Rehab  Date 03/28/20  Educator SB  Instruction Review Code 1- Verbalizes Understanding      Other Matters of the Heart: - Provides group verbal, written materials and models to describe Stable Angina and Peripheral Artery. Includes description of the disease process and treatment options available to the cardiac patient.   Infection Prevention: - Provides verbal and written material to individual with discussion of infection control including proper hand washing and proper equipment cleaning during exercise session.   Cardiac Rehab from 04/11/2020 in [facility] Cardiac and Pulmonary Rehab  Date 03/21/20  Educator [facility]  Instruction Review Code 1- Verbalizes Understanding      Falls Prevention: - Provides verbal and written material to individual with discussion of falls prevention and safety.   Cardiac Rehab  from 04/11/2020 in [facility] Cardiac and Pulmonary Rehab  Date 03/21/20  Educator [facility]  Instruction Review Code 1- Verbalizes Understanding      Other: -Provides group and verbal instruction on various topics (see comments)   Knowledge Questionnaire Score:  Knowledge Questionnaire Score - 03/26/20 1153      Knowledge Questionnaire Score   Pre Score 20/26 Education Focus: Nutrition, Exercies, Anatomy, Heart Failure           Core Components/Risk Factors/Patient Goals at Admission:  Personal Goals and Risk Factors at Admission - 03/26/20 1154      Core Components/Risk Factors/Patient Goals on Admission    Weight Management Yes;Weight Loss;Obesity    Intervention Weight Management: Develop a combined  nutrition and exercise program designed to reach desired caloric intake, while maintaining appropriate intake of nutrient and fiber, sodium and fats, and appropriate energy expenditure required for the weight goal.;Weight Management: Provide education and appropriate resources to help participant work on and attain dietary goals.;Weight Management/Obesity: Establish reasonable short term and long term weight goals.;Obesity: Provide education and appropriate resources to help participant work on and attain dietary goals.    Admit Weight 231 lb 4.8 oz (104.9 kg)    Goal Weight: Short Term 225 lb (102.1 kg)    Goal Weight: Long Term 220 lb (99.8 kg)    Expected Outcomes Long Term: Adherence to nutrition and physical activity/exercise program aimed toward attainment of established weight goal;Short Term: Continue to assess and modify interventions until short term weight is achieved;Weight Loss: Understanding of general recommendations for a balanced deficit meal plan, which promotes 1-2 lb weight loss per week and includes a negative energy balance of [phone] kcal/d;Understanding recommendations for meals to include 15-35% energy as protein, 25-35% energy from fat, 35-60% energy from carbohydrates, less than 243m of dietary cholesterol, 20-35 gm of total fiber daily;Understanding of distribution of calorie intake throughout the day with the consumption of 4-5 meals/snacks    Diabetes Yes    Intervention Provide education about signs/symptoms and action to take for hypo/hyperglycemia.;Provide education about proper nutrition, including hydration, and aerobic/resistive exercise prescription along with prescribed medications to achieve blood glucose in normal ranges: Fasting glucose 65-99 mg/dL    Expected Outcomes Short Term: Participant verbalizes understanding of the signs/symptoms and immediate care of hyper/hypoglycemia, proper foot care and importance of medication, aerobic/resistive exercise and nutrition  plan for blood glucose control.;Long Term: Attainment of HbA1C < 7%.    Hypertension Yes    Intervention Provide education on lifestyle modifcations including regular physical activity/exercise, weight management, moderate sodium restriction and increased consumption of fresh fruit, vegetables, and low fat dairy, alcohol moderation, and smoking cessation.;Monitor prescription use compliance.    Expected Outcomes Short Term: Continued assessment and intervention until BP is < 140/935mHG in hypertensive participants. < 130/8082mG in hypertensive participants with diabetes, heart failure or chronic kidney disease.;Long Term: Maintenance of blood pressure at goal levels.    Lipids Yes    Intervention Provide education and support for participant on nutrition & aerobic/resistive exercise along with prescribed medications to achieve LDL <42m32mDL >40mg1m Expected Outcomes Short Term: Participant states understanding of desired cholesterol values and is compliant with medications prescribed. Participant is following exercise prescription and nutrition guidelines.;Long Term: Cholesterol controlled with medications as prescribed, with individualized exercise RX and with personalized nutrition plan. Value goals: LDL < 42mg,85m > 40 mg.           Education:Diabetes - Individual verbal and written instruction  to review signs/symptoms of diabetes, desired ranges of glucose level fasting, after meals and with exercise. Acknowledge that pre and post exercise glucose checks will be done for 3 sessions at entry of program.   Cardiac Rehab from 04/11/2020 in [facility] Cardiac and Pulmonary Rehab  Date 03/21/20  Educator [facility]  Instruction Review Code 1- Verbalizes Understanding      Education: Know Your Numbers and Risk Factors: -Group verbal and written instruction about important numbers in your health.  Discussion of what are risk factors and how they play a role in the disease process.  Review of Cholesterol,  Blood Pressure, Diabetes, and BMI and the role they play in your overall health.   Cardiac Rehab from 04/11/2020 in [facility] Cardiac [facility] Pulmonary Rehab  Date 03/26/20  Instruction Review Code 3- Needs Reinforcement  [need identified]      Core Components/Risk Factors/Patient Goals Review:    Core Components/Risk Factors/Patient Goals at Discharge (Final Review):    ITP Comments:  ITP Comments    Row Name 03/21/20 0931 03/26/20 1142 03/28/20 1022 04/10/20 1423 04/11/20 1038   ITP Comments Virtual Visit completed. Patient informed on EP and RD appointment and 6 Minute walk test. Patient also informed of patient health questionnaires on My Chart. Patient Verbalizes understanding. Visit diagnosis can be found in [facility] 02/29/2020. Completed 6MWT and gym orientation. Initial ITP created and sent for review to Dr. Emily Filbert, Medical Director. First full day of exercise!  Patient was oriented to gym and equipment including functions, settings, policies, and procedures.  Patient's individual exercise prescription and treatment plan were reviewed.  All starting workloads were established based on the results of the 6 minute walk test done at initial orientation visit.  The plan for exercise progression was also introduced and progression will be customized based on patient's performance and goals. 30 day review completed. ITP sent to Dr. Emily Filbert, Medical Director of Cardiac and Pulmonary Rehab. Continue with ITP unless changes are made by physician. Continues with his angina symptoms- he is aware of nitroglycerin use and calling 911 if meds do not control his symptoms Hehas called MD, is planning on changing cardiologists to have one closer to home.   [place] Name 04/16/20 0908 04/25/20 1306 05/07/20 1040 05/08/20 0608     ITP Comments Doug called to let us know that he has had four episodes since last attending. He has called his doctor and they encouraged him to go to the emergency room.  He was hesitant about  this so we talked about urgent care options as he does need to be seen.  He will get seen somewhere and will keep [place] updated. Jeremy Dorsey has had a new stent placed and now seeing a new cardiologist.  He is out on hold until he follows up in the office prior to restarting rehab.  We will discharge him under the NSTEMI order and bring him back with his new stent with Dr. Rockey Situ once cleared to return. Completed 6MWT and gym orientation. Initial ITP created and sent for review to Dr. Emily Filbert, Medical Director.  Restarting with new stents and MD. 30 Day review completed. Medical Director ITP review done, changes made as directed, and signed approval by Medical Director.           Comments:

## 2020-05-09 NOTE — Telephone Encounter (Signed)
Spoke with providers assistant and reviewed note from provider regarding patients labs. She will pass this information over to her and will also have her log in to see secure chat note with information as well. She provided me with her fax number to send this to their office and had no further questions at this time.

## 2020-05-09 NOTE — Telephone Encounter (Signed)
Called office and was transferred to providers assistant and left voicemail message on her line to call back when possible regarding this patient.

## 2020-05-14 ENCOUNTER — Other Ambulatory Visit: Payer: Self-pay

## 2020-05-14 ENCOUNTER — Encounter: Payer: PPO | Attending: Cardiology | Admitting: *Deleted

## 2020-05-14 DIAGNOSIS — I214 Non-ST elevation (NSTEMI) myocardial infarction: Secondary | ICD-10-CM | POA: Diagnosis not present

## 2020-05-14 DIAGNOSIS — Z955 Presence of coronary angioplasty implant and graft: Secondary | ICD-10-CM | POA: Insufficient documentation

## 2020-05-14 NOTE — Progress Notes (Signed)
Daily Session Note  Patient Details  Name: Jeremy Dorsey MRN: [id number] Date of Birth: 1951-06-14 Referring Provider:     Cardiac Rehab from 05/07/2020 in [facility] Cardiac and Pulmonary Rehab  Referring Provider [name] MD      Encounter Date: 05/14/2020  Check In:  Session Check In - 05/14/20 1147      Check-In   Supervising physician immediately available to respond to emergencies See telemetry face sheet for immediately available ER MD    Location [facility]-Cardiac & Pulmonary Rehab    Staff Present Heath Lark, RN, BSN, Jacklynn Bue, MS Exercise Physiologist;Amanda Oletta Darter, [place], ACSM CEP, Exercise Physiologist    Virtual Visit No    Medication changes reported     No    Fall or balance concerns reported    No    Warm-up and Cool-down Performed on first and last piece of equipment    Resistance Training Performed Yes    VAD Patient? No    PAD/SET Patient? No      Pain Assessment   Currently in Pain? No/denies              Social History   Tobacco Use  Smoking Status Former Smoker   Packs/day: 1.00   Years: 30.00   Pack years: 30.00   Types: Cigarettes   Quit date: 08/23/2007   Years since quitting: 12.7  Smokeless Tobacco Never Used    Goals Met:  Independence with exercise equipment Exercise tolerated well No report of cardiac concerns or symptoms  Goals Unmet:  Not Applicable  Comments:   First full day of exercise!  Patient was oriented to gym and equipment including functions, settings, policies, and procedures.  Patient's individual exercise prescription and treatment plan were reviewed.  All starting workloads were established based on the results of the 6 minute walk test done at initial orientation visit.  The plan for exercise progression was also introduced and progression will be customized based on patient's performance and goals.  Jeremy Dorsey is returning after new stent placed for persistent angina.    Dr. Emily Filbert is Medical  Director for [place] and [name].

## 2020-05-16 ENCOUNTER — Encounter: Payer: PPO | Admitting: *Deleted

## 2020-05-16 ENCOUNTER — Other Ambulatory Visit: Payer: Self-pay

## 2020-05-16 DIAGNOSIS — I214 Non-ST elevation (NSTEMI) myocardial infarction: Secondary | ICD-10-CM

## 2020-05-16 DIAGNOSIS — Z955 Presence of coronary angioplasty implant and graft: Secondary | ICD-10-CM

## 2020-05-16 NOTE — Progress Notes (Signed)
Daily Session Note  Patient Details  Name: Jeremy Dorsey MRN: [id number] Date of Birth: 09/16/1950 Referring Provider:     Cardiac Rehab from 05/07/2020 in [facility] Cardiac and Pulmonary Rehab  Referring Provider [name] MD      Encounter Date: 05/16/2020  Check In:  Session Check In - 05/16/20 1036      Check-In   Supervising physician immediately available to respond to emergencies See telemetry face sheet for immediately available ER MD    Location [facility]-Cardiac & Pulmonary Rehab    Staff Present Renita Papa, RN Margurite Auerbach, MS Exercise Physiologist;Jessica [place], MA, RCEP, CCRP, CCET;Amanda Sommer, [place], ACSM CEP, Exercise Physiologist    Virtual Visit No    Medication changes reported     No    Fall or balance concerns reported    No    Warm-up and Cool-down Performed on first and last piece of equipment    Resistance Training Performed Yes    VAD Patient? No    PAD/SET Patient? No      Pain Assessment   Currently in Pain? No/denies              Social History   Tobacco Use  Smoking Status Former Smoker  . Packs/day: 1.00  . Years: 30.00  . Pack years: 30.00  . Types: Cigarettes  . Quit date: 08/23/2007  . Years since quitting: 12.7  Smokeless Tobacco Never Used    Goals Met:  Independence with exercise equipment Exercise tolerated well No report of cardiac concerns or symptoms Strength training completed today  Goals Unmet:  Not Applicable  Comments: Pt able to follow exercise prescription today without complaint.  Will continue to monitor for progression.    Dr. Emily Filbert is Medical Director for [place] and [name].

## 2020-05-17 DIAGNOSIS — Z6835 Body mass index (BMI) 35.0-35.9, adult: Secondary | ICD-10-CM | POA: Diagnosis not present

## 2020-05-17 DIAGNOSIS — N2581 Secondary hyperparathyroidism of renal origin: Secondary | ICD-10-CM | POA: Diagnosis not present

## 2020-05-17 DIAGNOSIS — N184 Chronic kidney disease, stage 4 (severe): Secondary | ICD-10-CM | POA: Diagnosis not present

## 2020-05-17 DIAGNOSIS — R809 Proteinuria, unspecified: Secondary | ICD-10-CM | POA: Diagnosis not present

## 2020-05-17 DIAGNOSIS — I129 Hypertensive chronic kidney disease with stage 1 through stage 4 chronic kidney disease, or unspecified chronic kidney disease: Secondary | ICD-10-CM | POA: Diagnosis not present

## 2020-05-17 DIAGNOSIS — I2510 Atherosclerotic heart disease of native coronary artery without angina pectoris: Secondary | ICD-10-CM | POA: Diagnosis not present

## 2020-05-17 DIAGNOSIS — E1122 Type 2 diabetes mellitus with diabetic chronic kidney disease: Secondary | ICD-10-CM | POA: Diagnosis not present

## 2020-05-17 DIAGNOSIS — D7218 Eosinophilia in diseases classified elsewhere: Secondary | ICD-10-CM | POA: Diagnosis not present

## 2020-05-17 DIAGNOSIS — N3080 Other cystitis without hematuria: Secondary | ICD-10-CM | POA: Diagnosis not present

## 2020-05-17 DIAGNOSIS — E875 Hyperkalemia: Secondary | ICD-10-CM | POA: Diagnosis not present

## 2020-05-20 NOTE — Telephone Encounter (Signed)
Patient called to be scheduled

## 2020-05-21 ENCOUNTER — Other Ambulatory Visit: Payer: Self-pay

## 2020-05-21 ENCOUNTER — Encounter: Payer: PPO | Admitting: *Deleted

## 2020-05-21 DIAGNOSIS — Z955 Presence of coronary angioplasty implant and graft: Secondary | ICD-10-CM | POA: Diagnosis not present

## 2020-05-21 NOTE — Progress Notes (Signed)
Daily Session Note  Patient Details  Name: Jeremy Dorsey MRN: [id number] Date of Birth: Dec 07, 1950 Referring Provider:     Cardiac Rehab from 05/07/2020 in [facility] Cardiac and Pulmonary Rehab  Referring Provider [name] MD      Encounter Date: 05/21/2020  Check In:  Session Check In - 05/21/20 1043      Check-In   Virtual Visit No    Medication changes reported     No    Fall or balance concerns reported    No    Warm-up and Cool-down Performed on first and last piece of equipment    Resistance Training Performed Yes    VAD Patient? No    PAD/SET Patient? No      Pain Assessment   Currently in Pain? No/denies              Social History   Tobacco Use  Smoking Status Former Smoker  . Packs/day: 1.00  . Years: 30.00  . Pack years: 30.00  . Types: Cigarettes  . Quit date: 08/23/2007  . Years since quitting: 12.7  Smokeless Tobacco Never Used    Goals Met:  Independence with exercise equipment Exercise tolerated well No report of cardiac concerns or symptoms  Goals Unmet:  Not Applicable  Comments: Pt able to follow exercise prescription today without complaint.  Will continue to monitor for progression.    Dr. Emily Filbert is Medical Director for [place] and [name].

## 2020-05-22 ENCOUNTER — Other Ambulatory Visit: Payer: Self-pay

## 2020-05-22 ENCOUNTER — Ambulatory Visit (INDEPENDENT_AMBULATORY_CARE_PROVIDER_SITE_OTHER): Payer: PPO | Admitting: Physician Assistant

## 2020-05-22 ENCOUNTER — Encounter: Payer: Self-pay | Admitting: Physician Assistant

## 2020-05-22 VITALS — BP 125/76 | HR 72 | Temp 98.5°F | Resp 16 | Wt 233.8 lb

## 2020-05-22 DIAGNOSIS — E119 Type 2 diabetes mellitus without complications: Secondary | ICD-10-CM | POA: Diagnosis not present

## 2020-05-22 DIAGNOSIS — Z23 Encounter for immunization: Secondary | ICD-10-CM | POA: Diagnosis not present

## 2020-05-22 LAB — POCT GLYCOSYLATED HEMOGLOBIN (HGB A1C)
Est. average glucose Bld gHb Est-mCnc: 163
Hemoglobin A1C: 7.3 % — AB (ref 4.0–5.6)

## 2020-05-22 NOTE — Patient Instructions (Signed)
Trulicity-GLP1-agonist; once weekly injection for diabetes.   Trulicity/Dulaglutide injection What is this medicine? DULAGLUTIDE (DOO la GLOO tide) is used to improve blood sugar control in adults with type 2 diabetes. This medicine may be used with other oral diabetes medicines. This drug may also reduce the risk of heart attack or stroke if you have type 2 diabetes and risk factors for heart disease. This medicine may be used for other purposes; ask your health care provider or pharmacist if you have questions. COMMON BRAND NAME(S): Trulicity What should I tell my health care provider before I take this medicine? They need to know if you have any of these conditions:  endocrine tumors (MEN 2) or if someone in your family had these tumors  eye disease, vision problems  history of pancreatitis  kidney disease  liver disease  stomach problems  thyroid cancer or if someone in your family had thyroid cancer  an unusual or allergic reaction to dulaglutide, other medicines, foods, dyes, or preservatives  pregnant or trying to get pregnant  breast-feeding How should I use this medicine? This medicine is for injection under the skin of your upper leg (thigh), stomach area, or upper arm. It is usually given once every week (every 7 days). You will be taught how to prepare and give this medicine. Use exactly as directed. Take your medicine at regular intervals. Do not take it more often than directed. If you use this medicine with insulin, you should inject this medicine and the insulin separately. Do not mix them together. Do not give the injections right next to each other. Change (rotate) injection sites with each injection. It is important that you put your used needles and syringes in a special sharps container. Do not put them in a trash can. If you do not have a sharps container, call your pharmacist or healthcare provider to get one. A special MedGuide will be given to you by the  pharmacist with each prescription and refill. Be sure to read this information carefully each time. This drug comes with INSTRUCTIONS FOR USE. Ask your pharmacist for directions on how to use this drug. Read the information carefully. Talk to your pharmacist or health care provider if you have questions. Talk to your pediatrician regarding the use of this medicine in children. Special care may be needed. Overdosage: If you think you have taken too much of this medicine contact a poison control center or emergency room at once. NOTE: This medicine is only for you. Do not share this medicine with others. What if I miss a dose? If you miss a dose, take it as soon as you can within 3 days after the missed dose. Then take your next dose at your regular weekly time. If it has been longer than 3 days after the missed dose, do not take the missed dose. Take the next dose at your regular time. Do not take double or extra doses. If you have questions about a missed dose, contact your health care provider for advice. What may interact with this medicine?  other medicines for diabetes Many medications may cause changes in blood sugar, these include:  alcohol containing beverages  antiviral medicines for HIV or AIDS  aspirin and aspirin-like drugs  certain medicines for blood pressure, heart disease, irregular heart beat  chromium  diuretics  male hormones, such as estrogens or progestins, birth control pills  fenofibrate  gemfibrozil  isoniazid  lanreotide  male hormones or anabolic steroids  MAOIs like Carbex, Eldepryl,  Marplan, Nardil, and Parnate  medicines for weight loss  medicines for allergies, asthma, cold, or cough  medicines for depression, anxiety, or psychotic disturbances  niacin  nicotine  NSAIDs, medicines for pain and inflammation, like ibuprofen or naproxen  octreotide  pasireotide  pentamidine  phenytoin  probenecid  quinolone antibiotics such as  ciprofloxacin, levofloxacin, ofloxacin  some herbal dietary supplements  steroid medicines such as prednisone or cortisone  sulfamethoxazole; trimethoprim  thyroid hormones Some medications can hide the warning symptoms of low blood sugar (hypoglycemia). You may need to monitor your blood sugar more closely if you are taking one of these medications. These include:  beta-blockers, often used for high blood pressure or heart problems (examples include atenolol, metoprolol, propranolol)  clonidine  guanethidine  reserpine This list may not describe all possible interactions. Give your health care provider a list of all the medicines, herbs, non-prescription drugs, or dietary supplements you use. Also tell them if you smoke, drink alcohol, or use illegal drugs. Some items may interact with your medicine. What should I watch for while using this medicine? Visit your doctor or health care professional for regular checks on your progress. Drink plenty of fluids while taking this medicine. Check with your doctor or health care professional if you get an attack of severe diarrhea, nausea, and vomiting. The loss of too much body fluid can make it dangerous for you to take this medicine. A test called the HbA1C (A1C) will be monitored. This is a simple blood test. It measures your blood sugar control over the last 2 to 3 months. You will receive this test every 3 to 6 months. Learn how to check your blood sugar. Learn the symptoms of low and high blood sugar and how to manage them. Always carry a quick-source of sugar with you in case you have symptoms of low blood sugar. Examples include hard sugar candy or glucose tablets. Make sure others know that you can choke if you eat or drink when you develop serious symptoms of low blood sugar, such as seizures or unconsciousness. They must get medical help at once. Tell your doctor or health care professional if you have high blood sugar. You might need to  change the dose of your medicine. If you are sick or exercising more than usual, you might need to change the dose of your medicine. Do not skip meals. Ask your doctor or health care professional if you should avoid alcohol. Many nonprescription cough and cold products contain sugar or alcohol. These can affect blood sugar. Pens should never be shared. Even if the needle is changed, sharing may result in passing of viruses like hepatitis or HIV. Wear a medical ID bracelet or chain, and carry a card that describes your disease and details of your medicine and dosage times. What side effects may I notice from receiving this medicine? Side effects that you should report to your doctor or health care professional as soon as possible:  allergic reactions like skin rash, itching or hives, swelling of the face, lips, or tongue  breathing problems  changes in vision  diarrhea that continues or is severe  lump or swelling on the neck  severe nausea  signs and symptoms of infection like fever or chills; cough; sore throat; pain or trouble passing urine  signs and symptoms of low blood sugar such as feeling anxious, confusion, dizziness, increased hunger, unusually weak or tired, sweating, shakiness, cold, irritable, headache, blurred vision, fast heartbeat, loss of consciousness  signs  and symptoms of kidney injury like trouble passing urine or change in the amount of urine  trouble swallowing  unusual stomach upset or pain  vomiting Side effects that usually do not require medical attention (report to your doctor or health care professional if they continue or are bothersome):  diarrhea  loss of appetite  nausea  pain, redness, or irritation at site where injected  stomach upset This list may not describe all possible side effects. Call your doctor for medical advice about side effects. You may report side effects to FDA at 1-800-FDA-1088. Where should I keep my medicine? Keep out  of the reach of children. Store unopened pens in a refrigerator between 2 and 8 degrees C (36 and 46 degrees F). Do not freeze or use if the medicine has been frozen. Protect from light and excessive heat. Store in the carton until use. Each single-dose pen can be kept at room temperature, not to exceed 30 degrees C (86 degrees F) for a total of 14 days, if needed. Throw away any unused medicine after the expiration date on the label. NOTE: This sheet is a summary. It may not cover all possible information. If you have questions about this medicine, talk to your doctor, pharmacist, or health care provider.  2020 Elsevier/Gold Standard (2019-04-04 09:34:53)

## 2020-05-22 NOTE — Progress Notes (Signed)
Established patient visit   Patient: Jeremy Dorsey   DOB: [birth year]   69 y.o. Male  MRN: [id number] Visit Date: 05/22/2020  Today's healthcare provider: Mar Daring, PA-C   Chief Complaint  Patient presents with  . Follow-up   Subjective    HPI  Diabetes Mellitus Type II, Follow-up  Lab Results  Component Value Date   HGBA1C 7.3 (A) 05/22/2020   HGBA1C 7.3 (H) 03/01/2020   HGBA1C 8.0 (A) 06/10/2018   Wt Readings from Last 3 Encounters:  05/22/20 233 lb 12.8 oz (106.1 kg)  05/07/20 231 lb 6.4 oz (105 kg)  05/02/20 233 lb (105.7 kg)  patient currently on Januvia 25mg  daily. 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 Takes 1/2 in AM and half in PM.  He reports excellent compliance with treatment. He is not having side effects.  Symptoms: No fatigue No foot ulcerations  No appetite changes No nausea  No paresthesia of the feet  No polydipsia  No polyuria No visual disturbances   No vomiting      Current insulin regiment: none Eye exam: last year. Reports that he gets it every 2 years. Reports exercising-treadmill    Pertinent Labs: Lab Results  Component Value Date   CHOL 137 03/02/2020   HDL 28 (L) 03/02/2020   LDLCALC 88 03/02/2020   TRIG 106 03/02/2020   CHOLHDL 4.9 03/02/2020   Lab Results  Component Value Date   NA 138 04/29/2020   K 4.9 04/29/2020   CREATININE 5.35 (H) 04/29/2020   GFRNONAA 10 (L) 04/29/2020   GFRAA 12 (L) 04/29/2020   GLUCOSE 168 (H) 04/29/2020     ---------------------------------------------------------------------------------------------------  Patient Active Problem List   Diagnosis Date Noted  . Unstable angina ([place]) 04/24/2020  . CAD (coronary artery disease), native coronary artery 04/23/2020  . Acute non-Q wave non-ST elevation myocardial infarction (NSTEMI) ([place]) 03/01/2020  . Cataracts, bilateral 04/29/2017  . Kidney stones 04/29/2017  . Malignant melanoma of face excluding  eyelid, nose, lip, and ear ([place]) 04/29/2017  . Obesity (BMI 35.0-39.9 without comorbidity) 04/29/2017  . Pre-transplant evaluation for CKD (chronic kidney disease) 04/29/2017  . Secondary hyperparathyroidism of renal origin ([place]) 04/29/2017  . Type 2 diabetes mellitus, without long-term current use of insulin ([place]) 08/11/2016  . CKD (chronic kidney disease), stage V ([place]) 02/26/2015  . HLD (hyperlipidemia) 02/26/2015  . Essential hypertension 02/26/2015   Past Medical History:  Diagnosis Date  . Anemia   . CKD (chronic kidney disease), stage IV ([place])   . Coronary artery disease    a. s/p IVUS-guided DESx2 to prox & mid LAD, residual disease treated medically. EF 50-55% by recent echo 02/2020.  . Diabetes mellitus with nephropathy ([place]) 2008  . Hyperlipidemia LDL goal <70   . Hypertension 2008  . Lower extremity edema   . Shoulder pain    Right  . Urinary complication        Medications: Outpatient Medications Prior to Visit  Medication Sig  . aspirin EC 81 MG EC tablet Take 1 tablet (81 mg total) by mouth daily. Swallow whole.  Marland Kitchen atorvastatin (LIPITOR) 80 MG tablet Take 1 tablet (80 mg total) by mouth daily.  . calcitRIOL (ROCALTROL) 0.25 MCG capsule Take 0.5 mcg by mouth daily. (Prescription instructions say to take 2 capsules by mouth daily)  . clopidogrel (PLAVIX) 75 MG tablet Take 1 tablet (75 mg total) by mouth daily with breakfast.  . metoprolol tartrate (LOPRESSOR) 25  MG tablet Take 1 tablet (25 mg total) by mouth 2 (two) times daily.  . nitroGLYCERIN (NITROSTAT) 0.4 MG SL tablet Place 1 tablet (0.4 mg total) under the tongue every 5 (five) minutes x 3 doses as needed for chest pain.  . sitaGLIPtin (JANUVIA) 25 MG tablet Take 1 tablet (25 mg total) by mouth daily.  . sodium bicarbonate 650 MG tablet Take 650 mg by mouth 2 (two) times daily.   . sodium zirconium cyclosilicate (LOKELMA) 10 g PACK packet Take 10 g by mouth 3 (three) times a week.   No facility-administered  medications prior to visit.    Review of Systems  Constitutional: Negative.   Respiratory: Negative.   Cardiovascular: Negative.   Endocrine: Negative for polydipsia, polyphagia and polyuria.  Neurological: Negative.     Last CBC Lab Results  Component Value Date   WBC 8.9 04/29/2020   HGB 10.9 (L) 04/29/2020   HCT 31.8 (L) 04/29/2020   MCV 88.3 04/29/2020   MCH 30.3 04/29/2020   RDW 12.2 04/29/2020   PLT 158 17/51/0258   Last metabolic panel Lab Results  Component Value Date   GLUCOSE 168 (H) 04/29/2020   NA 138 04/29/2020   K 4.9 04/29/2020   CL 110 04/29/2020   CO2 18 (L) 04/29/2020   BUN 87 (H) 04/29/2020   CREATININE 5.35 (H) 04/29/2020   GFRNONAA 10 (L) 04/29/2020   GFRAA 12 (L) 04/29/2020   CALCIUM 8.9 04/29/2020   PHOS 4.2 04/07/2016   PROT 7.2 08/03/2018   ALBUMIN 4.4 08/03/2018   BILITOT 0.5 08/03/2018   ALKPHOS 125 08/03/2018   AST 14 (L) 08/03/2018   ALT 15 08/03/2018   ANIONGAP 10 04/29/2020      Objective    BP 125/76 (BP Location: Right Arm, Patient Position: Sitting, Cuff Size: Large)   Pulse 72   Temp 98.5 F (36.9 C) (Oral)   Resp 16   Wt 233 lb 12.8 oz (106.1 kg)   BMI 34.43 kg/m  BP Readings from Last 3 Encounters:  05/22/20 125/76  05/02/20 132/78  04/25/20 119/68   Wt Readings from Last 3 Encounters:  05/22/20 233 lb 12.8 oz (106.1 kg)  05/07/20 231 lb 6.4 oz (105 kg)  05/02/20 233 lb (105.7 kg)      Physical Exam Vitals reviewed.  Constitutional:      General: He is not in acute distress.    Appearance: Normal appearance. He is well-developed. He is obese. He is not ill-appearing or diaphoretic.  HENT:     Head: Normocephalic and atraumatic.  Cardiovascular:     Rate and Rhythm: Normal rate and regular rhythm.     Pulses: Normal pulses.     Heart sounds: Normal heart sounds. No murmur heard.  No friction rub. No gallop.   Pulmonary:     Effort: Pulmonary effort is normal. No respiratory distress.     Breath  sounds: Normal breath sounds. No wheezing or rales.  Musculoskeletal:     Cervical back: Normal range of motion and neck supple.  Neurological:     Mental Status: He is alert.      Results for orders placed or performed in visit on 05/22/20  POCT glycosylated hemoglobin (Hb A1C)  Result Value Ref Range   Hemoglobin A1C 7.3 (A) 4.0 - 5.6 %   Est. average glucose Bld gHb Est-mCnc 163     Assessment & Plan     1. Need for influenza vaccination Flu vaccine given today without complication.  Patient sat upright for 15 minutes to check for adverse reaction before being released. - Flu Vaccine QUAD High Dose(Fluad)  2. Need for pneumococcal vaccine Pneumococcal 23 Vaccine given to patient without complications. Patient sat for 15 minutes after administration and was tolerated well without adverse effects. - Pneumococcal polysaccharide vaccine 23-valent greater than or equal to 2yo subcutaneous/IM  3. Type 2 diabetes mellitus without complication, without long-term current use of insulin ([facility]) A1c stable at 7.3. Continue Januvia 25mg . F/u in 6 months.    Return in about 6 months (around 11/20/2020) for T2DM.      Reynolds Bowl, PA-C, have reviewed all documentation for this visit. The documentation on 05/23/20 for the exam, diagnosis, procedures, and orders are all accurate and complete.   Rubye Beach  [facility] [phone] (phone) [phone] (fax)  [place]

## 2020-05-23 DIAGNOSIS — I214 Non-ST elevation (NSTEMI) myocardial infarction: Secondary | ICD-10-CM

## 2020-05-23 DIAGNOSIS — Z955 Presence of coronary angioplasty implant and graft: Secondary | ICD-10-CM | POA: Diagnosis not present

## 2020-05-23 NOTE — Progress Notes (Signed)
Daily Session Note  Patient Details  Name: DOIL KAMARA MRN: [id number] Date of Birth: 02-06-1951 Referring Provider:     Cardiac Rehab from 05/07/2020 in [facility] Cardiac and Pulmonary Rehab  Referring Provider [name] MD      Encounter Date: 05/23/2020  Check In:  Session Check In - 05/23/20 1036      Check-In   Supervising physician immediately available to respond to emergencies See telemetry face sheet for immediately available ER MD    Location [facility]-Cardiac & Pulmonary Rehab    Staff Present Birdie Sons, MPA, RN;Melissa Caiola RDN, Rowe Pavy, BA, ACSM CEP, Exercise Physiologist    Virtual Visit No    Medication changes reported     No    Fall or balance concerns reported    No    Warm-up and Cool-down Performed on first and last piece of equipment    Resistance Training Performed Yes    VAD Patient? No    PAD/SET Patient? No      Pain Assessment   Currently in Pain? No/denies              Social History   Tobacco Use  Smoking Status Former Smoker  . Packs/day: 1.00  . Years: 30.00  . Pack years: 30.00  . Types: Cigarettes  . Quit date: 08/23/2007  . Years since quitting: 12.7  Smokeless Tobacco Never Used    Goals Met:  Independence with exercise equipment Exercise tolerated well No report of cardiac concerns or symptoms Strength training completed today  Goals Unmet:  Not Applicable  Comments: Pt able to follow exercise prescription today without complaint.  Will continue to monitor for progression.   Dr. Emily Filbert is Medical Director for [facility] and [name].

## 2020-05-28 ENCOUNTER — Ambulatory Visit: Payer: PPO

## 2020-05-28 ENCOUNTER — Other Ambulatory Visit: Payer: Self-pay

## 2020-05-28 ENCOUNTER — Encounter: Payer: PPO | Admitting: *Deleted

## 2020-05-28 DIAGNOSIS — Z955 Presence of coronary angioplasty implant and graft: Secondary | ICD-10-CM

## 2020-05-28 DIAGNOSIS — I214 Non-ST elevation (NSTEMI) myocardial infarction: Secondary | ICD-10-CM

## 2020-05-28 NOTE — Progress Notes (Signed)
Daily Session Note  Patient Details  Name: Jeremy Dorsey MRN: [id number] Date of Birth: Sep 09, 1950 Referring Provider:     Cardiac Rehab from 05/07/2020 in [facility] Cardiac and Pulmonary Rehab  Referring Provider [name] MD      Encounter Date: 05/28/2020  Check In:  Session Check In - 05/28/20 0944      Check-In   Supervising physician immediately available to respond to emergencies See telemetry face sheet for immediately available ER MD    Location [facility]-Cardiac & Pulmonary Rehab    Staff Present Hope Budds RDN, LDN;Nachelle Negrette Sherryll Burger, RN Margurite Auerbach, MS Exercise Physiologist    Virtual Visit No    Medication changes reported     No    Fall or balance concerns reported    No    Warm-up and Cool-down Performed on first and last piece of equipment    Resistance Training Performed Yes    VAD Patient? No    PAD/SET Patient? No      Pain Assessment   Currently in Pain? No/denies              Social History   Tobacco Use  Smoking Status Former Smoker  . Packs/day: 1.00  . Years: 30.00  . Pack years: 30.00  . Types: Cigarettes  . Quit date: 08/23/2007  . Years since quitting: 12.7  Smokeless Tobacco Never Used    Goals Met:  Independence with exercise equipment Exercise tolerated well No report of cardiac concerns or symptoms Strength training completed today  Goals Unmet:  Not Applicable  Comments: Pt able to follow exercise prescription today without complaint.  Will continue to monitor for progression.    Dr. Emily Filbert is Medical Director for [name].

## 2020-05-28 NOTE — Progress Notes (Deleted)
Subjective:   Jeremy Dorsey is a 69 y.o. male who presents for Medicare Annual/Subsequent preventive examination.  I connected with Christie Copley today by telephone and verified that I am speaking with the correct person using two identifiers. Location patient: home Location provider: work Persons participating in the virtual visit: patient, provider.   I discussed the limitations, risks, security and privacy concerns of performing an evaluation and management service by telephone and the availability of in person appointments. I also discussed with the patient that there may be a patient responsible charge related to this service. The patient expressed understanding and verbally consented to this telephonic visit.    Interactive audio and video telecommunications were attempted between this provider and patient, however failed, due to patient having technical difficulties OR patient did not have access to video capability.  We continued and completed visit with audio only.   Review of Systems    N/A        Objective:    There were no vitals filed for this visit. There is no height or weight on file to calculate BMI.  Advanced Directives 04/24/2020 03/21/2020 02/29/2020 05/24/2019 08/25/2018 08/22/2018 08/03/2018  Does Patient Have a Medical Advance Directive? No Yes No No;Yes No Yes No  Type of Advance Directive - [place];Living will - [place];Living will - - -  Does patient want to make changes to medical advance directive? - No - Patient declined - - No - Patient declined - -  Copy of [place] in Chart? - - - No - copy requested - - -  Would patient like information on creating a medical advance directive? No - Patient declined No - Patient declined No - Patient declined - No - Patient declined - -    Current Medications (verified) Outpatient Encounter Medications as of 05/29/2020  Medication Sig  . aspirin EC 81 MG  EC tablet Take 1 tablet (81 mg total) by mouth daily. Swallow whole.  Marland Kitchen atorvastatin (LIPITOR) 80 MG tablet Take 1 tablet (80 mg total) by mouth daily.  . calcitRIOL (ROCALTROL) 0.25 MCG capsule Take 0.5 mcg by mouth daily. (Prescription instructions say to take 2 capsules by mouth daily)  . clopidogrel (PLAVIX) 75 MG tablet Take 1 tablet (75 mg total) by mouth daily with breakfast.  . metoprolol tartrate (LOPRESSOR) 25 MG tablet Take 1 tablet (25 mg total) by mouth 2 (two) times daily.  . nitroGLYCERIN (NITROSTAT) 0.4 MG SL tablet Place 1 tablet (0.4 mg total) under the tongue every 5 (five) minutes x 3 doses as needed for chest pain.  . sitaGLIPtin (JANUVIA) 25 MG tablet Take 1 tablet (25 mg total) by mouth daily.  . sodium bicarbonate 650 MG tablet Take 650 mg by mouth 2 (two) times daily.   . sodium zirconium cyclosilicate (LOKELMA) 10 g PACK packet Take 10 g by mouth 3 (three) times a week.   No facility-administered encounter medications on file as of 05/29/2020.    Allergies (verified) Oxycodone and Hydrocodone   History: Past Medical History:  Diagnosis Date  . Anemia   . CKD (chronic kidney disease), stage IV ([place])   . Coronary artery disease    a. s/p IVUS-guided DESx2 to prox & mid LAD, residual disease treated medically. EF 50-55% by recent echo 02/2020.  . 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 Hyperlipidemia LDL goal <70   . Hypertension 2008  . Lower extremity edema   . Shoulder  pain    Right  . Urinary complication    Past Surgical History:  Procedure Laterality Date  . COLONOSCOPY  1990  . COLONOSCOPY  06/09/2011   Dr Bary Castilla  . CORONARY STENT INTERVENTION N/A 04/24/2020   Procedure: CORONARY STENT INTERVENTION;  Surgeon: Wellington Hampshire, MD;  Location: [facility];  Service: Cardiovascular;  Laterality: N/A;  . CYSTOSCOPY W/ RETROGRADES Bilateral 08/01/2018   Procedure: CYSTOSCOPY WITH RETROGRADE PYELOGRAM;  Surgeon: Billey Co, MD;   Location: [facility];  Service: Urology;  Laterality: Bilateral;  . CYSTOSCOPY WITH BIOPSY N/A 08/01/2018   Procedure: CYSTOSCOPY WITH Bladder BIOPSY;  Surgeon: Billey Co, MD;  Location: [facility];  Service: Urology;  Laterality: N/A;  . CYSTOSCOPY WITH STENT PLACEMENT Right 08/01/2018   Procedure: CYSTOSCOPY WITH STENT PLACEMENT;  Surgeon: Billey Co, MD;  Location: [facility];  Service: Urology;  Laterality: Right;  . EYE SURGERY Right    laser surgery  . INTRAVASCULAR ULTRASOUND/IVUS N/A 04/24/2020   Procedure: Intravascular Ultrasound/IVUS;  Surgeon: Wellington Hampshire, MD;  Location: [facility];  Service: Cardiovascular;  Laterality: N/A;  . LEFT HEART CATH AND CORONARY ANGIOGRAPHY N/A 04/24/2020   Procedure: LEFT HEART CATH AND CORONARY ANGIOGRAPHY;  Surgeon: Wellington Hampshire, MD;  Location: [facility];  Service: Cardiovascular;  Laterality: N/A;  . SHOULDER ARTHROSCOPY WITH OPEN ROTATOR CUFF REPAIR Right 08/25/2018   Procedure: SHOULDER ARTHROSCOPY WITH MINI OPEN ROTATOR CUFF REPAIR;  Surgeon: Thornton Park, MD;  Location: [facility];  Service: Orthopedics;  Laterality: Right;  . TRANSURETHRAL RESECTION OF BLADDER TUMOR N/A 08/01/2018   Procedure: TRANSURETHRAL RESECTION OF BLADDER TUMOR (TURBT);  Surgeon: Billey Co, MD;  Location: [facility];  Service: Urology;  Laterality: N/A;  . URETEROSCOPY WITH HOLMIUM LASER LITHOTRIPSY Right 08/01/2018   Procedure: URETEROSCOPY WITH HOLMIUM LASER LITHOTRIPSY;  Surgeon: Billey Co, MD;  Location: [facility];  Service: Urology;  Laterality: Right;   Family History  Problem Relation Age of Onset  . Psoriasis Mother   . Heart failure Father    Social History   Socioeconomic History  . Marital status: Married    Spouse name: Not on file  . Number of children: 3  . Years of education: Not on file  . Highest education level: Bachelor's degree (e.g., BA, AB, BS)  Occupational History    Comment: part time  Tobacco  Use  . Smoking status: Former Smoker    Packs/day: 1.00    Years: 30.00    Pack years: 30.00    Types: Cigarettes    Quit date: 08/23/2007    Years since quitting: 12.7  . Smokeless tobacco: Never Used  Vaping Use  . Vaping Use: Never used  Substance and Sexual Activity  . Alcohol use: No  . Drug use: No  . Sexual activity: Yes    Birth control/protection: None  Other Topics Concern  . Not on file  Social History Narrative  . Not on file   Social Determinants of Health   Financial Resource Strain:   . Difficulty of Paying Living Expenses: Not on file  Food Insecurity:   . Worried About Charity fundraiser in the Last Year: Not on file  . Ran Out of Food in the Last Year: Not on file  Transportation Needs:   . Lack of Transportation (Medical): Not on file  . Lack of Transportation (Non-Medical): Not on file  Physical Activity:   . Days of Exercise  per Week: Not on file  . Minutes of Exercise per Session: Not on file  Stress:   . Feeling of Stress : Not on file  Social Connections:   . Frequency of Communication with Friends and Family: Not on file  . Frequency of Social Gatherings with Friends and Family: Not on file  . Attends Religious Services: Not on file  . Active Member of Clubs or Organizations: Not on file  . Attends Archivist Meetings: Not on file  . Marital Status: Not on file    Tobacco Counseling Counseling given: Not Answered   Clinical Intake:                 Diabetic? Yes  Nutrition Risk Assessment:  Has the patient had any N/V/D within the last 2 months?  No  Does the patient have any non-healing wounds?  No  Has the patient had any unintentional weight loss or weight gain?  No   Diabetes:  Is the patient diabetic?  Yes  If diabetic, was a CBG obtained today?  No  Did the patient bring in their glucometer from home?  No  How often do you monitor your CBG's? ***.   Financial Strains and Diabetes Management:  Are you  having any financial strains with the device, your supplies or your medication? No .  Does the patient want to be seen by Chronic Care Management for management of their diabetes?  No  Would the patient like to be referred to a Nutritionist or for Diabetic Management?  No   Diabetic Exams:  Diabetic Eye Exam: Overdue for diabetic eye exam. Pt has been advised about the importance in completing this exam. Patient advised to call and schedule an eye exam. Diabetic Foot Exam: Overdue, Pt has been advised about the importance in completing this exam. Pt is scheduled for diabetic foot exam on ***.          Activities of Daily Living In your present state of health, do you have any difficulty performing the following activities: 04/24/2020 04/24/2020  Hearing? - Y  Vision? - N  Difficulty concentrating or making decisions? - N  Walking or climbing stairs? - Y  Dressing or bathing? - N  Doing errands, shopping? N -  Some recent data might be hidden    Patient Care Team: Mar Daring, PA-C as PCP - General (Family Medicine) Minna Merritts, MD as PCP - Cardiology (Cardiology)  Indicate any recent Medical Services you may have received from other than [facility] providers in the past year (date may be approximate).     Assessment:   This is a routine wellness examination for [place].  Hearing/Vision screen No exam data present  Dietary issues and exercise activities discussed:    Goals    . Exercise 3x per week (30 min per time)     Recommend to exercise for 3 days a week for at least 30 minutes at a time.       Depression Screen PHQ 2/9 Scores 05/07/2020 03/26/2020 05/24/2019  PHQ - 2 Score 0 2 0  PHQ- 9 Score 3 10 -    Fall Risk Fall Risk  03/21/2020 02/27/2020 05/24/2019 03/01/2019  Falls in the past year? 0 0 0 (No Data)  Comment - Emmi Telephone Survey: data to providers prior to load - Emmi Telephone Survey: data to providers prior to load  Number falls in past yr:  0 - 0 (No Data)  Comment - - -  Emmi Telephone Survey Actual Response =   Injury with Fall? 0 - 0 -  Risk for fall due to : No Fall Risks - - -  Follow up Falls evaluation completed;Education provided;Falls prevention discussed - - -    Any stairs in or around the home? {YES/NO:21197} If so, are there any without handrails? {YES/NO:21197} Home free of loose throw rugs in walkways, pet beds, electrical cords, etc? Yes  Adequate lighting in your home to reduce risk of falls? Yes   ASSISTIVE DEVICES UTILIZED TO PREVENT FALLS:  Life alert? {YES/NO:21197} Use of a cane, walker or w/c? {YES/NO:21197} Grab bars in the bathroom? {YES/NO:21197} Shower chair or bench in shower? {YES/NO:21197} Elevated toilet seat or a handicapped toilet? {YES/NO:21197}   Cognitive Function:        Immunizations Immunization History  Administered Date(s) Administered  . Fluad Quad(high Dose 65+) 05/24/2019, 05/22/2020  . Influenza Split 06/07/2006  . Influenza, High Dose Seasonal PF 06/10/2018  . Influenza,inj,Quad PF,6+ Mos 07/25/2013, 09/06/2014  . PFIZER SARS-COV-2 Vaccination 09/26/2019, 10/17/2019  . Pneumococcal Conjugate-13 05/24/2019  . Pneumococcal Polysaccharide-23 06/14/2004, 05/22/2020  . Zoster 01/23/2011    TDAP status: Due, Education has been provided regarding the importance of this vaccine. Advised may receive this vaccine at local pharmacy or Health Dept. Aware to provide a copy of the vaccination record if obtained from local pharmacy or Health Dept. Verbalized acceptance and understanding. Flu Vaccine status: Up to date Pneumococcal vaccine status: Up to date Covid-19 vaccine status: Completed vaccines  Qualifies for Shingles Vaccine? Yes   Zostavax completed Yes   Shingrix Completed?: No.    Education has been provided regarding the importance of this vaccine. Patient has been advised to call insurance company to determine out of pocket expense if they have not yet received this  vaccine. Advised may also receive vaccine at local pharmacy or Health Dept. Verbalized acceptance and understanding.  Screening Tests Health Maintenance  Topic Date Due  . Hepatitis C Screening  Never done  . FOOT EXAM  Never done  . OPHTHALMOLOGY EXAM  Never done  . URINE MICROALBUMIN  Never done  . TETANUS/TDAP  Never done  . HEMOGLOBIN A1C  11/20/2020  . COLONOSCOPY  06/08/2021  . INFLUENZA VACCINE  Completed  . COVID-19 Vaccine  Completed  . PNA vac Low Risk Adult  Completed    Health Maintenance  Health Maintenance Due  Topic Date Due  . Hepatitis C Screening  Never done  . FOOT EXAM  Never done  . OPHTHALMOLOGY EXAM  Never done  . URINE MICROALBUMIN  Never done  . TETANUS/TDAP  Never done    Colorectal cancer screening: Completed 06/09/11. Repeat every 10 years  Lung Cancer Screening: (Low Dose CT Chest recommended if Age 19-80 years, 30 pack-year currently smoking OR have quit w/in 15years.) {DOES NOT does:27190::"does not"} qualify.   Lung Cancer Screening Referral: ***  Additional Screening:  Hepatitis C Screening: does qualify; ***  Vision Screening: Recommended annual ophthalmology exams for early detection of glaucoma and other disorders of the eye. Is the patient up to date with their annual eye exam?  Yes  Who is the provider or what is the name of the office in which the patient attends annual eye exams? *** If pt is not established with a provider, would they like to be referred to a provider to establish care? No .   Dental Screening: Recommended annual dental exams for proper oral hygiene  Community Resource Referral / Chronic Care  Management: CRR required this visit?  No   CCM required this visit?  No      Plan:     I have personally reviewed and noted the following in the patient's chart:   . Medical and social history . Use of alcohol, tobacco or illicit drugs  . Current medications and supplements . Functional ability and  status . Nutritional status . Physical activity . Advanced directives . List of other physicians . Hospitalizations, surgeries, and ER visits in previous 12 months . Vitals . Screenings to include cognitive, depression, and falls . Referrals and appointments  In addition, I have reviewed and discussed with patient certain preventive protocols, quality metrics, and best practice recommendations. A written personalized care plan for preventive services as well as general preventive health recommendations were provided to patient.     Falcon Mccaskey [place], [place]   49/20/1007   Nurse Notes: ***

## 2020-05-29 ENCOUNTER — Ambulatory Visit (INDEPENDENT_AMBULATORY_CARE_PROVIDER_SITE_OTHER): Payer: PPO

## 2020-05-29 DIAGNOSIS — Z0000 Encounter for general adult medical examination without abnormal findings: Secondary | ICD-10-CM

## 2020-05-29 NOTE — Progress Notes (Signed)
Subjective:   Jeremy Dorsey is a 69 y.o. male who presents for Medicare Annual/Subsequent preventive examination.  I connected with Jeremy Dorsey today by telephone and verified that I am speaking with the correct person using two identifiers. Location patient: home Location provider: work Persons participating in the virtual visit: patient, provider.   I discussed the limitations, risks, security and privacy concerns of performing an evaluation and management service by telephone and the availability of in person appointments. I also discussed with the patient that there may be a patient responsible charge related to this service. The patient expressed understanding and verbally consented to this telephonic visit.    Interactive audio and video telecommunications were attempted between this provider and patient, however failed, due to patient having technical difficulties OR patient did not have access to video capability.  We continued and completed visit with audio only.   Review of Systems    N/A  Cardiac Risk Factors include: advanced age (>19men, >88 women);diabetes mellitus;dyslipidemia;male gender;hypertension;obesity (BMI >30kg/m2)     Objective:    There were no vitals filed for this visit. There is no height or weight on file to calculate BMI.  Advanced Directives 05/29/2020 04/24/2020 03/21/2020 02/29/2020 05/24/2019 08/25/2018 08/22/2018  Does Patient Have a Medical Advance Directive? No No Yes No No;Yes No Yes  Type of Advance Directive - Public librarian;Living will - [place];Living will - -  Does patient want to make changes to medical advance directive? No - Patient declined - No - Patient declined - - No - Patient declined -  Copy of [place] in Chart? - - - - No - copy requested - -  Would patient like information on creating a medical advance directive? No - Patient declined No - Patient declined No - Patient  declined No - Patient declined - No - Patient declined -    Current Medications (verified) Outpatient Encounter Medications as of 05/29/2020  Medication Sig  . aspirin EC 81 MG EC tablet Take 1 tablet (81 mg total) by mouth daily. Swallow whole.  Marland Kitchen atorvastatin (LIPITOR) 80 MG tablet Take 1 tablet (80 mg total) by mouth daily.  . calcitRIOL (ROCALTROL) 0.25 MCG capsule Take 0.5 mcg by mouth daily. (Prescription instructions say to take 2 capsules by mouth daily)  . clopidogrel (PLAVIX) 75 MG tablet Take 1 tablet (75 mg total) by mouth daily with breakfast.  . metoprolol tartrate (LOPRESSOR) 25 MG tablet Take 1 tablet (25 mg total) by mouth 2 (two) times daily.  . nitroGLYCERIN (NITROSTAT) 0.4 MG SL tablet Place 1 tablet (0.4 mg total) under the tongue every 5 (five) minutes x 3 doses as needed for chest pain.  . sitaGLIPtin (JANUVIA) 25 MG tablet Take 1 tablet (25 mg total) by mouth daily.  . sodium bicarbonate 650 MG tablet Take 650 mg by mouth 2 (two) times daily.   . sodium zirconium cyclosilicate (LOKELMA) 10 g PACK packet Take 10 g by mouth 3 (three) times a week.   No facility-administered encounter medications on file as of 05/29/2020.    Allergies (verified) Oxycodone and Hydrocodone   History: Past Medical History:  Diagnosis Date  . Anemia   . CKD (chronic kidney disease), stage IV ([place])   . Coronary artery disease    a. s/p IVUS-guided DESx2 to prox & mid LAD, residual disease treated medically. EF 50-55% by recent echo 02/2020.  . Diabetes mellitus with nephropathy ([facility]) 2008  . Hyperlipidemia  LDL goal <70   . Hypertension 2008  . Lower extremity edema   . Shoulder pain    Right  . Urinary complication    Past Surgical History:  Procedure Laterality Date  . COLONOSCOPY  1990  . COLONOSCOPY  06/09/2011   Dr Bary Castilla  . CORONARY STENT INTERVENTION N/A 04/24/2020   Procedure: CORONARY STENT INTERVENTION;  Surgeon: Wellington Hampshire, MD;  Location: [facility];   Service: Cardiovascular;  Laterality: N/A;  . CYSTOSCOPY W/ RETROGRADES Bilateral 08/01/2018   Procedure: CYSTOSCOPY WITH RETROGRADE PYELOGRAM;  Surgeon: Billey Co, MD;  Location: [facility];  Service: Urology;  Laterality: Bilateral;  . CYSTOSCOPY WITH BIOPSY N/A 08/01/2018   Procedure: CYSTOSCOPY WITH Bladder BIOPSY;  Surgeon: Billey Co, MD;  Location: [facility];  Service: Urology;  Laterality: N/A;  . CYSTOSCOPY WITH STENT PLACEMENT Right 08/01/2018   Procedure: CYSTOSCOPY WITH STENT PLACEMENT;  Surgeon: Billey Co, MD;  Location: [facility];  Service: Urology;  Laterality: Right;  . EYE SURGERY Right    laser surgery  . INTRAVASCULAR ULTRASOUND/IVUS N/A 04/24/2020   Procedure: Intravascular Ultrasound/IVUS;  Surgeon: Wellington Hampshire, MD;  Location: [facility];  Service: Cardiovascular;  Laterality: N/A;  . LEFT HEART CATH AND CORONARY ANGIOGRAPHY N/A 04/24/2020   Procedure: LEFT HEART CATH AND CORONARY ANGIOGRAPHY;  Surgeon: Wellington Hampshire, MD;  Location: [facility];  Service: Cardiovascular;  Laterality: N/A;  . SHOULDER ARTHROSCOPY WITH OPEN ROTATOR CUFF REPAIR Right 08/25/2018   Procedure: SHOULDER ARTHROSCOPY WITH MINI OPEN ROTATOR CUFF REPAIR;  Surgeon: Thornton Park, MD;  Location: [facility];  Service: Orthopedics;  Laterality: Right;  . TRANSURETHRAL RESECTION OF BLADDER TUMOR N/A 08/01/2018   Procedure: TRANSURETHRAL RESECTION OF BLADDER TUMOR (TURBT);  Surgeon: Billey Co, MD;  Location: [facility];  Service: Urology;  Laterality: N/A;  . URETEROSCOPY WITH HOLMIUM LASER LITHOTRIPSY Right 08/01/2018   Procedure: URETEROSCOPY WITH HOLMIUM LASER LITHOTRIPSY;  Surgeon: Billey Co, MD;  Location: [facility];  Service: Urology;  Laterality: Right;   Family History  Problem Relation Age of Onset  . Psoriasis Mother   . Heart failure Father    Social History   Socioeconomic History  . Marital status: Married    Spouse name: Not on file  .  Number of children: 3  . Years of education: Not on file  . Highest education level: Bachelor's degree (e.g., BA, AB, BS)  Occupational History    Comment: retired  Tobacco Use  . Smoking status: Former Smoker    Packs/day: 1.00    Years: 30.00    Pack years: 30.00    Types: Cigarettes    Quit date: 08/23/2007    Years since quitting: 12.7  . Smokeless tobacco: Never Used  Vaping Use  . Vaping Use: Never used  Substance and Sexual Activity  . Alcohol use: No  . Drug use: No  . Sexual activity: Yes    Birth control/protection: None  Other Topics Concern  . Not on file  Social History Narrative  . Not on file   Social Determinants of Health   Financial Resource Strain: Low Risk   . Difficulty of Paying Living Expenses: Not hard at all  Food Insecurity: No Food Insecurity  . Worried About Charity fundraiser in the Last Year: Never true  . Ran Out of Food in the Last Year: Never true  Transportation Needs: No Transportation Needs  . Lack of Transportation (Medical):  No  . Lack of Transportation (Non-Medical): No  Physical Activity: Insufficiently Active  . Days of Exercise per Week: 5 days  . Minutes of Exercise per Session: 20 min  Stress: No Stress Concern Present  . Feeling of Stress : Not at all  Social Connections: Moderately Isolated  . Frequency of Communication with Friends and Family: Once a week  . Frequency of Social Gatherings with Friends and Family: More than three times a week  . Attends Religious Services: Never  . Active Member of Clubs or Organizations: No  . Attends Archivist Meetings: Never  . Marital Status: Married    Tobacco Counseling Counseling given: Not Answered   Clinical Intake:  Pre-visit preparation completed: Yes  Pain : No/denies pain     Nutritional Risks: None Diabetes: Yes  How often do you need to have someone help you when you read instructions, pamphlets, or other written materials from your doctor or  pharmacy?: 1 - Never  Diabetic? Yes  Nutrition Risk Assessment:  Has the patient had any N/V/D within the last 2 months?  No  Does the patient have any non-healing wounds?  No  Has the patient had any unintentional weight loss or weight gain?  No   Diabetes:  Is the patient diabetic?  Yes  If diabetic, was a CBG obtained today?  No  Did the patient bring in their glucometer from home?  No  How often do you monitor your CBG's? Once a day in AM.   Financial Strains and Diabetes Management:  Are you having any financial strains with the device, your supplies or your medication? No .  Does the patient want to be seen by Chronic Care Management for management of their diabetes?  No  Would the patient like to be referred to a Nutritionist or for Diabetic Management?  No   Diabetic Exams:  Diabetic Eye Exam: Overdue for diabetic eye exam. Pt has been advised about the importance in completing this exam. Patient advised to call and schedule an eye exam. Diabetic Foot Exam: Completed 05/17/20   Interpreter Needed?: No  Information entered by :: [facility], LPN   Activities of Daily Living In your present state of health, do you have any difficulty performing the following activities: 05/29/2020 04/24/2020  Hearing? N -  Vision? N -  Difficulty concentrating or making decisions? N -  Walking or climbing stairs? N -  Dressing or bathing? N -  Doing errands, shopping? N N  Preparing Food and eating ? N -  Using the Toilet? N -  In the past six months, have you accidently leaked urine? N -  Do you have problems with loss of bowel control? N -  Managing your Medications? N -  Managing your Finances? N -  Housekeeping or managing your Housekeeping? N -  Some recent data might be hidden    Patient Care Team: Mar Daring, PA-C as PCP - General (Family Medicine) Minna Merritts, MD as PCP - Cardiology (Cardiology) Corliss Parish, MD as Consulting Physician  (Nephrology) Lorelee Cover., MD (Ophthalmology) Dasher, Rayvon Char, MD (Dermatology)  Indicate any recent Medical Services you may have received from other than [facility] providers in the past year (date may be approximate).     Assessment:   This is a routine wellness examination for Jeremy Dorsey.  Hearing/Vision screen No exam data present  Dietary issues and exercise activities discussed: Current Exercise Habits: Structured exercise class, Type of exercise: treadmill;walking, Time (Minutes): 20,  Frequency (Times/Week): 5, Weekly Exercise (Minutes/Week): 100, Intensity: Mild, Exercise limited by: None identified  Goals    . Weight (lb) < 200 lb (90.7 kg)     Continue current diet plan of cutting out all "white foods" in diet to help with weight loss goal.       Depression Screen PHQ 2/9 Scores 05/07/2020 03/26/2020 05/24/2019  PHQ - 2 Score 0 2 0  PHQ- 9 Score 3 10 -    Fall Risk Fall Risk  05/29/2020 03/21/2020 02/27/2020 05/24/2019 03/01/2019  Falls in the past year? 0 0 0 0 (No Data)  Comment - - Emmi Telephone Survey: data to providers prior to load - Emmi Telephone Survey: data to providers prior to load  Number falls in past yr: 0 0 - 0 (No Data)  Comment - - - - Emmi Telephone Survey Actual Response =   Injury with Fall? 0 0 - 0 -  Risk for fall due to : - No Fall Risks - - -  Follow up - Falls evaluation completed;Education provided;Falls prevention discussed - - -    Any stairs in or around the home? Yes  If so, are there any without handrails? No  Home free of loose throw rugs in walkways, pet beds, electrical cords, etc? Yes  Adequate lighting in your home to reduce risk of falls? Yes   ASSISTIVE DEVICES UTILIZED TO PREVENT FALLS:  Life alert? No  Use of a cane, walker or w/c? No  Grab bars in the bathroom? No Shower chair or bench in shower? Yes  Elevated toilet seat or a handicapped toilet? No    Cognitive Function: Declined today.          Immunizations Immunization History  Administered Date(s) Administered  . Fluad Quad(high Dose 65+) 05/24/2019, 05/22/2020  . Influenza Split 06/07/2006  . Influenza, High Dose Seasonal PF 06/10/2018  . Influenza,inj,Quad PF,6+ Mos 07/25/2013, 09/06/2014  . PFIZER SARS-COV-2 Vaccination 09/26/2019, 10/17/2019  . Pneumococcal Conjugate-13 05/24/2019  . Pneumococcal Polysaccharide-23 06/14/2004, 05/22/2020  . Zoster 01/23/2011    TDAP status: Due, Education has been provided regarding the importance of this vaccine. Advised may receive this vaccine at local pharmacy or Health Dept. Aware to provide a copy of the vaccination record if obtained from local pharmacy or Health Dept. Verbalized acceptance and understanding. Flu Vaccine status: Up to date Pneumococcal vaccine status: Up to date Covid-19 vaccine status: Completed vaccines  Qualifies for Shingles Vaccine? Yes   Zostavax completed Yes   Shingrix Completed?: No.    Education has been provided regarding the importance of this vaccine. Patient has been advised to call insurance company to determine out of pocket expense if they have not yet received this vaccine. Advised may also receive vaccine at local pharmacy or Health Dept. Verbalized acceptance and understanding.  Screening Tests Health Maintenance  Topic Date Due  . Hepatitis C Screening  Never done  . OPHTHALMOLOGY EXAM  Never done  . URINE MICROALBUMIN  Never done  . TETANUS/TDAP  05/29/2021 (Originally 03/30/1970)  . HEMOGLOBIN A1C  11/20/2020  . FOOT EXAM  05/17/2021  . COLONOSCOPY  06/08/2021  . INFLUENZA VACCINE  Completed  . COVID-19 Vaccine  Completed  . PNA vac Low Risk Adult  Completed    Health Maintenance  Health Maintenance Due  Topic Date Due  . Hepatitis C Screening  Never done  . OPHTHALMOLOGY EXAM  Never done  . URINE MICROALBUMIN  Never done    Colorectal cancer screening: Completed  06/09/11. Repeat every 10 years  Lung Cancer Screening:  (Low Dose CT Chest recommended if Age 1-80 years, 30 pack-year currently smoking OR have quit w/in 15years.) does qualify however had this completed 02/29/20. Repeat yearly.   Additional Screening:  Hepatitis C Screening: does qualify; and would like to discuss this further with PCP.  Vision Screening: Recommended annual ophthalmology exams for early detection of glaucoma and other disorders of the eye. Is the patient up to date with their annual eye exam?  Yes  Who is the provider or what is the name of the office in which the patient attends annual eye exams? Dr Gloriann Loan If pt is not established with a provider, would they like to be referred to a provider to establish care? No .   Dental Screening: Recommended annual dental exams for proper oral hygiene  Community Resource Referral / Chronic Care Management: CRR required this visit?  No   CCM required this visit?  No      Plan:     I have personally reviewed and noted the following in the patient's chart:   . Medical and social history . Use of alcohol, tobacco or illicit drugs  . Current medications and supplements . Functional ability and status . Nutritional status . Physical activity . Advanced directives . List of other physicians . Hospitalizations, surgeries, and ER visits in previous 12 months . Vitals . Screenings to include cognitive, depression, and falls . Referrals and appointments  In addition, I have reviewed and discussed with patient certain preventive protocols, quality metrics, and best practice recommendations. A written personalized care plan for preventive services as well as general preventive health recommendations were provided to patient.     Jeremy Dorsey [place], [place]   [id number]   Nurse Notes: Pt needs a urine check and would like to discuss the Hep C lab order with PCP at next in office apt. Pt plans to schedule an eye exam before the end of the year.

## 2020-05-29 NOTE — Patient Instructions (Signed)
Jeremy Dorsey , Thank you for taking time to come for your Medicare Wellness Visit. I appreciate your ongoing commitment to your health goals. Please review the following plan we discussed and let me know if I can assist you in the future.   Screening recommendations/referrals: Colonoscopy: Up to date, due 06/2021 Recommended yearly ophthalmology/optometry visit for glaucoma screening and checkup Recommended yearly dental visit for hygiene and checkup  Vaccinations: Influenza vaccine: Done 05/22/20 Pneumococcal vaccine: Completed series Tdap vaccine: Currently due, declined at this time.  Shingles vaccine: Shingrix discussed. Please contact your pharmacy for coverage information.     Advanced directives: Advance directive discussed with you today. Even though you declined this today please call our office should you change your mind and we can give you the proper paperwork for you to fill out.  Conditions/risks identified: Continue current diet plan of cutting out all "white foods" in diet to help with weight loss goal.   Next appointment: 11/06/20 @ 2:00 PM with Fenton Malling.   Preventive Care 70 Years and Older, Male Preventive care refers to lifestyle choices and visits with your health care provider that can promote health and wellness. What does preventive care include?  A yearly physical exam. This is also called an annual well check.  Dental exams once or twice a year.  Routine eye exams. Ask your health care provider how often you should have your eyes checked.  Personal lifestyle choices, including:  Daily care of your teeth and gums.  Regular physical activity.  Eating a healthy diet.  Avoiding tobacco and drug use.  Limiting alcohol use.  Practicing safe sex.  Taking low doses of aspirin every day.  Taking vitamin and mineral supplements as recommended by your health care provider. What happens during an annual well check? The services and screenings done by  your health care provider during your annual well check will depend on your age, overall health, lifestyle risk factors, and family history of disease. Counseling  Your health care provider may ask you questions about your:  Alcohol use.  Tobacco use.  Drug use.  Emotional well-being.  Home and relationship well-being.  Sexual activity.  Eating habits.  History of falls.  Memory and ability to understand (cognition).  Work and work Statistician. Screening  You may have the following tests or measurements:  Height, weight, and BMI.  Blood pressure.  Lipid and cholesterol levels. These may be checked every 5 years, or more frequently if you are over 70 years old.  Skin check.  Lung cancer screening. You may have this screening every year starting at age 70 if you have a 30-pack-year history of smoking and currently smoke or have quit within the past 15 years.  Fecal occult blood test (FOBT) of the stool. You may have this test every year starting at age 70.  Flexible sigmoidoscopy or colonoscopy. You may have a sigmoidoscopy every 5 years or a colonoscopy every 10 years starting at age 70.  Prostate cancer screening. Recommendations will vary depending on your family history and other risks.  Hepatitis C blood test.  Hepatitis B blood test.  Sexually transmitted disease (STD) testing.  Diabetes screening. This is done by checking your blood sugar (glucose) after you have not eaten for a while (fasting). You may have this done every 1-3 years.  Abdominal aortic aneurysm (AAA) screening. You may need this if you are a current or former smoker.  Osteoporosis. You may be screened starting at age 70 if you are  at high risk. Talk with your health care provider about your test results, treatment options, and if necessary, the need for more tests. Vaccines  Your health care provider may recommend certain vaccines, such as:  Influenza vaccine. This is recommended every  year.  Tetanus, diphtheria, and acellular pertussis (Tdap, Td) vaccine. You may need a Td booster every 10 years.  Zoster vaccine. You may need this after age 70.  Pneumococcal 13-valent conjugate (PCV13) vaccine. One dose is recommended after age 70.  Pneumococcal polysaccharide (PPSV23) vaccine. One dose is recommended after age 70. Talk to your health care provider about which screenings and vaccines you need and how often you need them. This information is not intended to replace advice given to you by your health care provider. Make sure you discuss any questions you have with your health care provider. Document Released: 08/16/2015 Document Revised: 04/08/2016 Document Reviewed: 05/21/2015 Elsevier Interactive Patient Education  2017 [employer] Prevention in the Home Falls can cause injuries. They can happen to people of all ages. There are many things you can do to make your home safe and to help prevent falls. What can I do on the outside of my home?  Regularly fix the edges of walkways and driveways and fix any cracks.  Remove anything that might make you trip as you walk through a door, such as a raised step or threshold.  Trim any bushes or trees on the path to your home.  Use bright outdoor lighting.  Clear any walking paths of anything that might make someone trip, such as rocks or tools.  Regularly check to see if handrails are loose or broken. Make sure that both sides of any steps have handrails.  Any raised decks and porches should have guardrails on the edges.  Have any leaves, snow, or ice cleared regularly.  Use sand or salt on walking paths during winter.  Clean up any spills in your garage right away. This includes oil or grease spills. What can I do in the bathroom?  Use night lights.  Install grab bars by the toilet and in the tub and shower. Do not use towel bars as grab bars.  Use non-skid mats or decals in the tub or shower.  If you  need to sit down in the shower, use a plastic, non-slip stool.  Keep the floor dry. Clean up any water that spills on the floor as soon as it happens.  Remove soap buildup in the tub or shower regularly.  Attach bath mats securely with double-sided non-slip rug tape.  Do not have throw rugs and other things on the floor that can make you trip. What can I do in the bedroom?  Use night lights.  Make sure that you have a light by your bed that is easy to reach.  Do not use any sheets or blankets that are too big for your bed. They should not hang down onto the floor.  Have a firm chair that has side arms. You can use this for support while you get dressed.  Do not have throw rugs and other things on the floor that can make you trip. What can I do in the kitchen?  Clean up any spills right away.  Avoid walking on wet floors.  Keep items that you use a lot in easy-to-reach places.  If you need to reach something above you, use a strong step stool that has a grab bar.  Keep electrical cords out of  the way.  Do not use floor polish or wax that makes floors slippery. If you must use wax, use non-skid floor wax.  Do not have throw rugs and other things on the floor that can make you trip. What can I do with my stairs?  Do not leave any items on the stairs.  Make sure that there are handrails on both sides of the stairs and use them. Fix handrails that are broken or loose. Make sure that handrails are as long as the stairways.  Check any carpeting to make sure that it is firmly attached to the stairs. Fix any carpet that is loose or worn.  Avoid having throw rugs at the top or bottom of the stairs. If you do have throw rugs, attach them to the floor with carpet tape.  Make sure that you have a light switch at the top of the stairs and the bottom of the stairs. If you do not have them, ask someone to add them for you. What else can I do to help prevent falls?  Wear shoes  that:  Do not have high heels.  Have rubber bottoms.  Are comfortable and fit you well.  Are closed at the toe. Do not wear sandals.  If you use a stepladder:  Make sure that it is fully opened. Do not climb a closed stepladder.  Make sure that both sides of the stepladder are locked into place.  Ask someone to hold it for you, if possible.  Clearly mark and make sure that you can see:  Any grab bars or handrails.  First and last steps.  Where the edge of each step is.  Use tools that help you move around (mobility aids) if they are needed. These include:  Canes.  Walkers.  Scooters.  Crutches.  Turn on the lights when you go into a dark area. Replace any light bulbs as soon as they burn out.  Set up your furniture so you have a clear path. Avoid moving your furniture around.  If any of your floors are uneven, fix them.  If there are any pets around you, be aware of where they are.  Review your medicines with your doctor. Some medicines can make you feel dizzy. This can increase your chance of falling. Ask your doctor what other things that you can do to help prevent falls. This information is not intended to replace advice given to you by your health care provider. Make sure you discuss any questions you have with your health care provider. Document Released: 05/16/2009 Document Revised: 12/26/2015 Document Reviewed: 08/24/2014 Elsevier Interactive Patient Education  2017 [employer].

## 2020-05-30 ENCOUNTER — Other Ambulatory Visit: Payer: Self-pay

## 2020-05-30 DIAGNOSIS — I214 Non-ST elevation (NSTEMI) myocardial infarction: Secondary | ICD-10-CM

## 2020-05-30 DIAGNOSIS — Z955 Presence of coronary angioplasty implant and graft: Secondary | ICD-10-CM | POA: Diagnosis not present

## 2020-05-30 NOTE — Progress Notes (Signed)
Daily Session Note  Patient Details  Name: FAVOR HACKLER MRN: [id number] Date of Birth: 08-09-1950 Referring Provider:     Cardiac Rehab from 05/07/2020 in [facility]  Referring Provider [name] MD      Encounter Date: 05/30/2020  Check In:  Session Check In - 05/30/20 1009      Check-In   Supervising physician immediately available to respond to emergencies See telemetry face sheet for immediately available ER MD    Location [facility]    Staff Present Birdie Sons, MPA, RN;Melissa Caiola RDN, Rowe Pavy, BA, ACSM CEP, Exercise Physiologist;Kara Eliezer Bottom, MS Exercise Physiologist;Jessica Ellerslie, MA, RCEP, CCRP, CCET    Virtual Visit No    Medication changes reported     No    Fall or balance concerns reported    No    Warm-up and Cool-down Performed on first and last piece of equipment    Resistance Training Performed Yes    VAD Patient? No    PAD/SET Patient? No      Pain Assessment   Currently in Pain? No/denies              Social History   Tobacco Use  Smoking Status Former Smoker  . Packs/day: 1.00  . Years: 30.00  . Pack years: 30.00  . Types: Cigarettes  . Quit date: 08/23/2007  . Years since quitting: 12.7  Smokeless Tobacco Never Used    Goals Met:  Independence with exercise equipment Exercise tolerated well No report of cardiac concerns or symptoms Strength training completed today  Goals Unmet:  Not Applicable  Comments: Pt able to follow exercise prescription today without complaint.  Will continue to monitor for progression.    Dr. Emily Filbert is Medical Director for [place] and [name].

## 2020-05-31 ENCOUNTER — Ambulatory Visit: Payer: PPO | Admitting: Family

## 2020-06-04 ENCOUNTER — Other Ambulatory Visit: Payer: Self-pay

## 2020-06-04 ENCOUNTER — Encounter: Payer: PPO | Attending: Cardiology | Admitting: *Deleted

## 2020-06-04 DIAGNOSIS — Z955 Presence of coronary angioplasty implant and graft: Secondary | ICD-10-CM | POA: Insufficient documentation

## 2020-06-04 DIAGNOSIS — I214 Non-ST elevation (NSTEMI) myocardial infarction: Secondary | ICD-10-CM | POA: Insufficient documentation

## 2020-06-04 NOTE — Progress Notes (Signed)
Daily Session Note  Patient Details  Name: Jeremy Dorsey MRN: [id number] Date of Birth: 11-10-1950 Referring Provider:     Cardiac Rehab from 05/07/2020 in [facility]  Referring Provider [name] MD      Encounter Date: 06/04/2020  Check In:  Session Check In - 06/04/20 1044      Check-In   Supervising physician immediately available to respond to emergencies See telemetry face sheet for immediately available ER MD    Location [facility]    Staff Present Heath Lark, RN, BSN, CCRP;Joseph Hood RCP,RRT,BSRT;Amanda Oletta Darter, [place], ACSM CEP, Exercise Physiologist    Virtual Visit No    Medication changes reported     No    Fall or balance concerns reported    No    Warm-up and Cool-down Performed on first and last piece of equipment    Resistance Training Performed Yes    VAD Patient? No    PAD/SET Patient? No      Pain Assessment   Currently in Pain? No/denies              Social History   Tobacco Use  Smoking Status Former Smoker  . Packs/day: 1.00  . Years: 30.00  . Pack years: 30.00  . Types: Cigarettes  . Quit date: 08/23/2007  . Years since quitting: 12.7  Smokeless Tobacco Never Used    Goals Met:  Independence with exercise equipment Exercise tolerated well No report of cardiac concerns or symptoms  Goals Unmet:  Not Applicable  Comments: Pt able to follow exercise prescription today without complaint.  Will continue to monitor for progression.    Dr. Emily Filbert is Medical Director for [place] and [name].

## 2020-06-05 ENCOUNTER — Encounter: Payer: Self-pay | Admitting: *Deleted

## 2020-06-05 DIAGNOSIS — Z955 Presence of coronary angioplasty implant and graft: Secondary | ICD-10-CM

## 2020-06-05 NOTE — Progress Notes (Signed)
Cardiac Individual Treatment Plan  Patient Details  Name: HARWOOD NALL MRN: [id number] Date of Birth: 31-Jul-1951 Referring Provider:     Cardiac Rehab from 05/07/2020 in [facility] Cardiac and Pulmonary Rehab  Referring Provider [name] MD      Initial Encounter Date:    Cardiac Rehab from 05/07/2020 in [facility] Cardiac and Pulmonary Rehab  Date 05/07/20      Visit Diagnosis: Status post coronary artery stent placement  Patient's Home Medications on Admission:  Current Outpatient Medications:  .  aspirin EC 81 MG EC tablet, Take 1 tablet (81 mg total) by mouth daily. Swallow whole., Disp: 30 tablet, Rfl: 11 .  atorvastatin (LIPITOR) 80 MG tablet, Take 1 tablet (80 mg total) by mouth daily., Disp: 90 tablet, Rfl: 1 .  calcitRIOL (ROCALTROL) 0.25 MCG capsule, Take 0.5 mcg by mouth daily. (Prescription instructions say to take 2 capsules by mouth daily), Disp: , Rfl:  .  clopidogrel (PLAVIX) 75 MG tablet, Take 1 tablet (75 mg total) by mouth daily with breakfast., Disp: 90 tablet, Rfl: 1 .  metoprolol tartrate (LOPRESSOR) 25 MG tablet, Take 1 tablet (25 mg total) by mouth 2 (two) times daily., Disp: 180 tablet, Rfl: 1 .  nitroGLYCERIN (NITROSTAT) 0.4 MG SL tablet, Place 1 tablet (0.4 mg total) under the tongue every 5 (five) minutes x 3 doses as needed for chest pain., Disp: 25 tablet, Rfl: 12 .  sitaGLIPtin (JANUVIA) 25 MG tablet, Take 1 tablet (25 mg total) by mouth daily., Disp: 30 tablet, Rfl: 3 .  sodium bicarbonate 650 MG tablet, Take 650 mg by mouth 2 (two) times daily. , Disp: , Rfl:  .  sodium zirconium cyclosilicate (LOKELMA) 10 g PACK packet, Take 10 g by mouth 3 (three) times a week., Disp: , Rfl:   Past Medical History: Past Medical History:  Diagnosis Date  . Anemia   . CKD (chronic kidney disease), stage IV ([place])   . Coronary artery disease    a. s/p IVUS-guided DESx2 to prox & mid LAD, residual disease treated medically. EF 50-55% by recent echo 02/2020.  . 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 Hyperlipidemia LDL goal <70   . Hypertension 2008  . Lower extremity edema   . Shoulder pain    Right  . Urinary complication     Tobacco Use: Social History   Tobacco Use  Smoking Status Former Smoker  . Packs/day: 1.00  . Years: 30.00  . Pack years: 30.00  . Types: Cigarettes  . Quit date: 08/23/2007  . Years since quitting: 12.7  Smokeless Tobacco Never Used    Labs: Recent Chemical engineer    Labs for ITP Cardiac and Pulmonary Rehab Latest Ref Rng & Units 02/22/2017 06/10/2018 03/01/2020 03/02/2020 05/22/2020   Cholestrol 0 - 200 mg/dL - - 158 137 -   LDLCALC 0 - 99 mg/dL - - 92 88 -   HDL >40 mg/dL - - 31(L) 28(L) -   Trlycerides <150 mg/dL - - 173(H) 106 -   Hemoglobin A1c 4.0 - 5.6 % 9.1 8.0(A) 7.3(H) - 7.3(A)       Exercise Target Goals: Exercise Program Goal: Individual exercise prescription set using results from initial 6 min walk test and THRR while considering  patient's activity barriers and safety.   Exercise Prescription Goal: Initial exercise prescription builds to 30-45 minutes a day of aerobic activity, 2-3 days per week.  Home exercise guidelines will be given to patient during program as part  of exercise prescription that the participant will acknowledge.   Education: Aerobic Exercise & Resistance Training: - Gives group verbal and written instruction on the various components of exercise. Focuses on aerobic and resistive training programs and the benefits of this training and how to safely progress through these programs..   Cardiac Rehab from 05/23/2020 in [facility] Cardiac and Pulmonary Rehab  Date 05/23/20  Educator [facility]  Instruction Review Code 1- Verbalizes Understanding      Education: Exercise & Equipment Safety: - Individual verbal instruction and demonstration of equipment use and safety with use of the equipment.   Cardiac Rehab from 05/23/2020 in [facility] Cardiac and Pulmonary Rehab  Date 03/21/20    Educator [facility]  Instruction Review Code 1- Verbalizes Understanding      Education: Exercise Physiology & General Exercise Guidelines: - Group verbal and written instruction with models to review the exercise physiology of the cardiovascular system and associated critical values. Provides general exercise guidelines with specific guidelines to those with heart or lung disease.    Cardiac Rehab from 05/23/2020 in [facility] Cardiac and Pulmonary Rehab  Date 03/26/20  Instruction Review Code 3- Needs Reinforcement  [need identified]      Education: Flexibility, Balance, Mind/Body Relaxation: Provides group verbal/written instruction on the benefits of flexibility and balance training, including mind/body exercise modes such as yoga, pilates and tai chi.  Demonstration and skill practice provided.   Activity Barriers & Risk Stratification:  Activity Barriers & Cardiac Risk Stratification - 05/07/20 1041      Activity Barriers & Cardiac Risk Stratification   Activity Barriers Joint Problems;Deconditioning;Muscular Weakness;Shortness of Breath;Balance Concerns;History of Falls;Other (comment)    Comments pain in legs with walking (claudication? no PAD Dx)    Cardiac Risk Stratification High           6 Minute Walk:  6 Minute Walk    Row Name 05/07/20 1040         6 Minute Walk   Phase Initial     Distance 925 feet     Walk Time 6 minutes     # of Rest Breaks 0     MPH 1.75     METS 1.87     RPE 13     VO2 Peak 6.5     Symptoms Yes (comment)     Comments pain in calves 9/10, legs fatigued     Resting HR 73 bpm     Resting BP 128/60     Resting Oxygen Saturation  96 %     Exercise Oxygen Saturation  during 6 min walk 99 %     Max Ex. HR 99 bpm     Max Ex. BP 132/70     2 Minute Post BP 122/62            Oxygen Initial Assessment:   Oxygen Re-Evaluation:   Oxygen Discharge (Final Oxygen Re-Evaluation):   Initial Exercise Prescription:  Initial Exercise  Prescription - 05/07/20 1000      Date of Initial Exercise RX and Referring Provider   Date 05/07/20    Referring Provider [name] MD      Treadmill   MPH 1.7    Grade 0    Minutes 15    METs 2.3      Recumbant Bike   Level 1    RPM 50    Watts 5    Minutes 15    METs 2      NuStep   Level  1    SPM 80    Minutes 15    METs 2      REL-XR   Level 1    Speed 50    Minutes 15    METs 2      Prescription Details   Frequency (times per week) 2    Duration Progress to 30 minutes of continuous aerobic without signs/symptoms of physical distress      Intensity   THRR 40-80% of Max Heartrate 104-135    Ratings of Perceived Exertion 11-13    Perceived Dyspnea 0-4      Progression   Progression Continue to progress workloads to maintain intensity without signs/symptoms of physical distress.      Resistance Training   Training Prescription Yes    Weight 3 lb    Reps 10-15           Perform Capillary Blood Glucose checks as needed.  Exercise Prescription Changes:  Exercise Prescription Changes    Row Name 05/07/20 1000 05/20/20 1200           Response to Exercise   Blood Pressure (Admit) 128/60 122/68      Blood Pressure (Exercise) 132/70 132/64      Blood Pressure (Exit) 122/62 128/72      Heart Rate (Admit) 73 bpm 76 bpm      Heart Rate (Exercise) 99 bpm 110 bpm      Heart Rate (Exit) 74 bpm 91 bpm      Oxygen Saturation (Admit) 96 % --      Oxygen Saturation (Exercise) 99 % --      Rating of Perceived Exertion (Exercise) 13 12      Symptoms legs fatigued, calve pain 9/10 --      Comments walk test results first day      Duration -- Progress to 30 minutes of  aerobic without signs/symptoms of physical distress      Intensity -- THRR unchanged        Progression   Progression -- Continue to progress workloads to maintain intensity without signs/symptoms of physical distress.      Average METs -- 2.2        Resistance Training   Training  Prescription -- Yes      Weight -- 3 lb      Reps -- 10-15        Treadmill   MPH -- 2.5      Grade -- 0.5      Minutes -- 15      METs -- 3.09        REL-XR   Level -- 3      Speed -- 50      Minutes -- 15      METs -- 1.3             Exercise Comments:  Exercise Comments    Row Name 04/11/20 1039 05/14/20 1148         Exercise Comments Continues with his angina symptoms- he is aware of nitroglycerin use and calling 911 if meds do not control his symptoms Hehas called MD, is planning on changing cardiologists to have one closer to home. First full day of exercise!  Patient was oriented to gym and equipment including functions, settings, policies, and procedures.  Patient's individual exercise prescription and treatment plan were reviewed.  All starting workloads were established based on the results of the 6 minute walk test done at initial orientation visit.  The plan for exercise progression was also introduced and progression will be customized based on patient's performance and goals.             Exercise Goals and Review:   Exercise Goals Re-Evaluation :  Exercise Goals Re-Evaluation    Row Name 04/09/20 1604 05/14/20 1148 05/30/20 1006         Exercise Goal Re-Evaluation   Exercise Goals Review Increase Physical Activity;Increase Strength and Stamina;Understanding of Exercise Prescription Able to understand and use rate of perceived exertion (RPE) scale;Knowledge and understanding of Target Heart Rate Range (THRR);Understanding of Exercise Prescription Able to understand and use rate of perceived exertion (RPE) scale;Knowledge and understanding of Target Heart Rate Range (THRR);Understanding of Exercise Prescription     Comments [name] was off to a good start in rehab, until the chest pain today.  Last week he went to the beach.  He has completed his first two full days of exercise.  We will continue to monitor his progress. Reviewed RPE and dyspnea scales, THR and  program prescription with pt today.  Pt voiced understanding and was given a copy of goals to take home. [name] does not do any exercise at home as of yet. He recent;y came back to rehab after he had a stent placed and feels much better, he rarely experiences chest pain and feels confident to start exercise at home. He has 3 different machines he can chose from. EP will review home exercise in the next couple of weeks.     Expected Outcomes Short: Continue to attend rehab regularly  Long: Continue to follow program prescription. Short: Use RPE daily to regulate intensity. Long: Follow program prescription in THR. Short: Review home exercise and expectations Long: Able to exercise independently at home with no complications            Discharge Exercise Prescription (Final Exercise Prescription Changes):  Exercise Prescription Changes - 05/20/20 1200      Response to Exercise   Blood Pressure (Admit) 122/68    Blood Pressure (Exercise) 132/64    Blood Pressure (Exit) 128/72    Heart Rate (Admit) 76 bpm    Heart Rate (Exercise) 110 bpm    Heart Rate (Exit) 91 bpm    Rating of Perceived Exertion (Exercise) 12    Comments first day    Duration Progress to 30 minutes of  aerobic without signs/symptoms of physical distress    Intensity THRR unchanged      Progression   Progression Continue to progress workloads to maintain intensity without signs/symptoms of physical distress.    Average METs 2.2      Resistance Training   Training Prescription Yes    Weight 3 lb    Reps 10-15      Treadmill   MPH 2.5    Grade 0.5    Minutes 15    METs 3.09      REL-XR   Level 3    Speed 50    Minutes 15    METs 1.3           Nutrition:  Target Goals: Understanding of nutrition guidelines, daily intake of sodium <1546m, cholesterol <2027m calories 30% from fat and 7% or less from saturated fats, daily to have 5 or more servings of fruits and vegetables.  Education: Controlling  Sodium/Reading Food Labels -Group verbal and written material supporting the discussion of sodium use in heart healthy nutrition. Review and explanation with models, verbal and written materials for utilization  of the food label.   Education: General Nutrition Guidelines/Fats and Fiber: -Group instruction provided by verbal, written material, models and posters to present the general guidelines for heart healthy nutrition. Gives an explanation and review of dietary fats and fiber.   Cardiac Rehab from 05/23/2020 in [facility] Cardiac and Pulmonary Rehab  Date 04/11/20  Educator [facility]  Instruction Review Code 1- Verbalizes Understanding  [need identified]      Biometrics:  Pre Biometrics - 05/07/20 1044      Pre Biometrics   Height 5' 9.1" (1.755 m)    Weight 231 lb 6.4 oz (105 kg)    BMI (Calculated) 34.08    Single Leg Stand 5.2 seconds            Nutrition Therapy Plan and Nutrition Goals:  Nutrition Therapy & Goals - 06/04/20 0925      Nutrition Therapy   Diet Heart healthy, low Na, CKD stg 4 MNT    Drug/Food Interactions Statins/Certain Fruits    Protein (specify units) 85g    Fiber 30 grams    Whole Grain Foods 3 servings    Saturated Fats 12 max. grams    Fruits and Vegetables 8 servings/day    Sodium 1.5 grams      Personal Nutrition Goals   Nutrition Goal ST: try canned salmon and beans (bean salad) to switch up protein. Add walnuts to breakfast or boild egg ~2x/week to add protein.  LT: get a variety of vegetables with moderate potassium intake, get a variety of protein foods    Comments No salt since July, not bread at everymeal, no red meat for 4 months. He is lactose intolerant. B: Bowl of life cereal with almond milk with water L: chicken sandwich or [place] - whole wheat bread - sometimes no bread S: unsweet tea D: salads with sliced [place], soups (homemade chicken noodle - reduced salt, vegetable beef soup, clear broth clam "chowder"), pork chops 1x/month, wife doesn't  like fish - will get when out to eat, chicken and [place] most of the time. Will have brown rice when he has grains. Vegetables: he loves green beans, corn, brussels sprouts, he doesn't like beets, okra, or asparagus. Uses extra virgin olive oil  or pam spray. He doesn't like canned tuna that he doesn't like. No medication for diabetes, BG is well controlled. 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 Discussed heart healthy and T2DM eating. He would like to get a wider variety of protein.      Intervention Plan   Intervention Prescribe, educate and counsel regarding individualized specific dietary modifications aiming towards targeted core components such as weight, hypertension, lipid management, diabetes, heart failure and other comorbidities.;Nutrition handout(s) given to patient.    Expected Outcomes Short Term Goal: Understand basic principles of dietary content, such as calories, fat, sodium, cholesterol and nutrients.;Short Term Goal: A plan has been developed with personal nutrition goals set during dietitian appointment.;Long Term Goal: Adherence to prescribed nutrition plan.           Nutrition Assessments:   MEDIFICTS Score Key:          ?70 Need to make dietary changes          40-70 Heart Healthy Diet         ? 40 Therapeutic Level Cholesterol Diet  Nutrition Goals Re-Evaluation:   Nutrition Goals Discharge (Final Nutrition Goals Re-Evaluation):   Psychosocial: Target Goals: Acknowledge presence or absence of significant depression and/or stress, maximize coping skills,  provide positive support system. Participant is able to verbalize types and ability to use techniques and skills needed for reducing stress and depression.   Education: Depression - Provides group verbal and written instruction on the correlation between heart/lung disease and depressed mood, treatment options, and the stigmas associated with seeking treatment.   Education: Sleep Hygiene -Provides group verbal  and written instruction about how sleep can affect your health.  Define sleep hygiene, discuss sleep cycles and impact of sleep habits. Review good sleep hygiene tips.     Education: Stress and Anxiety: - Provides group verbal and written instruction about the health risks of elevated stress and causes of high stress.  Discuss the correlation between heart/lung disease and anxiety and treatment options. Review healthy ways to manage with stress and anxiety.    Initial Review & Psychosocial Screening:   Quality of Life Scores:   Scores of 19 and below usually indicate a poorer quality of life in these areas.  A difference of  2-3 points is a clinically meaningful difference.  A difference of 2-3 points in the total score of the Quality of Life Index has been associated with significant improvement in overall quality of life, self-image, physical symptoms, and general health in studies assessing change in quality of life.  PHQ-9: Recent Review Flowsheet Data    Depression screen [facility] 2/9 05/07/2020 03/26/2020 05/24/2019   Decreased Interest 0 1 0   Down, Depressed, Hopeless 0 1 0   PHQ - 2 Score 0 2 0   Altered sleeping 1 3 -   Tired, decreased energy 1 2 -   Change in appetite 1 3 -   Feeling bad or failure about yourself  0 0 -   Trouble concentrating 0 0 -   Moving slowly or fidgety/restless 0 0 -   Suicidal thoughts 0 0 -   PHQ-9 Score 3 10 -   Difficult doing work/chores Not difficult at all Not difficult at all -     Interpretation of Total Score  Total Score Depression Severity:  1-4 = Minimal depression, 5-9 = Mild depression, 10-14 = Moderate depression, 15-19 = Moderately severe depression, 20-27 = Severe depression   Psychosocial Evaluation and Intervention:   Psychosocial Re-Evaluation:  Psychosocial Re-Evaluation    [place] Name 05/30/20 1016             Psychosocial Re-Evaluation   Current issues with Current Sleep Concerns       Comments [name] is doing well  mentally. He stated he has not slept well since a while ago since he is up a lot to go to the bathroom in the middle of the night. He has already talked to his doctor about it and has declined taking any sleep-aid or medications to help with sleep. Denied big stressors or anxiety related events. He has great support from his wife. He is feeling much better since he has his stent placed as he rarely feels chest pain. Therefore, he feels he can go out and do more which he is happy about.       Expected Outcomes Short: Continue attending rehab consistently Long: Utilize exercise for stress management and maintain positive attitude       Interventions Encouraged to attend Cardiac Rehabilitation for the exercise       Continue Psychosocial Services  Follow up required by staff              Psychosocial Discharge (Final Psychosocial Re-Evaluation):  Psychosocial Re-Evaluation - 05/30/20  1016      Psychosocial Re-Evaluation   Current issues with Current Sleep Concerns    Comments [name] is doing well mentally. He stated he has not slept well since a while ago since he is up a lot to go to the bathroom in the middle of the night. He has already talked to his doctor about it and has declined taking any sleep-aid or medications to help with sleep. Denied big stressors or anxiety related events. He has great support from his wife. He is feeling much better since he has his stent placed as he rarely feels chest pain. Therefore, he feels he can go out and do more which he is happy about.    Expected Outcomes Short: Continue attending rehab consistently Long: Utilize exercise for stress management and maintain positive attitude    Interventions Encouraged to attend Cardiac Rehabilitation for the exercise    Continue Psychosocial Services  Follow up required by staff           Vocational Rehabilitation: Provide vocational rehab assistance to qualifying candidates.   Vocational Rehab Evaluation &  Intervention:   Education: Education Goals: Education classes will be provided on a variety of topics geared toward better understanding of heart health and risk factor modification. Participant will state understanding/return demonstration of topics presented as noted by education test scores.  Learning Barriers/Preferences:   General Cardiac Education Topics:  AED/CPR: - Group verbal and written instruction with the use of models to demonstrate the basic use of the AED with the basic ABC's of resuscitation.   Anatomy & Physiology of the Heart: - Group verbal and written instruction and models provide basic cardiac anatomy and physiology, with the coronary electrical and arterial systems. Review of Valvular disease and Heart Failure   Cardiac Rehab from 05/23/2020 in [facility] Cardiac and Pulmonary Rehab  Date 03/26/20  Instruction Review Code 3- Needs Reinforcement  [need identified]      Cardiac Procedures: - Group verbal and written instruction to review commonly prescribed medications for heart disease. Reviews the medication, class of the drug, and side effects. Includes the steps to properly store meds and maintain the prescription regimen. (beta blockers and nitrates)   Cardiac Rehab from 05/23/2020 in [facility] Cardiac and Pulmonary Rehab  Date 03/28/20  Educator SB  Instruction Review Code 1- Verbalizes Understanding      Cardiac Medications I: - Group verbal and written instruction to review commonly prescribed medications for heart disease. Reviews the medication, class of the drug, and side effects. Includes the steps to properly store meds and maintain the prescription regimen.   Cardiac Medications II: -Group verbal and written instruction to review commonly prescribed medications for heart disease. Reviews the medication, class of the drug, and side effects. (all other drug classes)   Cardiac Rehab from 05/23/2020 in [facility] Cardiac and Pulmonary Rehab  Date 03/26/20   Instruction Review Code 3- Needs Reinforcement  [need identified]       Go Sex-Intimacy & Heart Disease, Get SMART - Goal Setting: - Group verbal and written instruction through game format to discuss heart disease and the return to sexual intimacy. Provides group verbal and written material to discuss and apply goal setting through the application of the S.M.A.R.T. Method.   Cardiac Rehab from 05/23/2020 in [facility] Cardiac and Pulmonary Rehab  Date 03/28/20  Educator SB  Instruction Review Code 1- Verbalizes Understanding      Other Matters of the Heart: - Provides group verbal, written materials and models to  describe Stable Angina and Peripheral Artery. Includes description of the disease process and treatment options available to the cardiac patient.   Infection Prevention: - Provides verbal and written material to individual with discussion of infection control including proper hand washing and proper equipment cleaning during exercise session.   Cardiac Rehab from 05/23/2020 in [facility] Cardiac and Pulmonary Rehab  Date 03/21/20  Educator [facility]  Instruction Review Code 1- Verbalizes Understanding      Falls Prevention: - Provides verbal and written material to individual with discussion of falls prevention and safety.   Cardiac Rehab from 05/23/2020 in [facility] Cardiac and Pulmonary Rehab  Date 03/21/20  Educator [facility]  Instruction Review Code 1- Verbalizes Understanding      Other: -Provides group and verbal instruction on various topics (see comments)   Knowledge Questionnaire Score:   Core Components/Risk Factors/Patient Goals at Admission:   Education:Diabetes - Individual verbal and written instruction to review signs/symptoms of diabetes, desired ranges of glucose level fasting, after meals and with exercise. Acknowledge that pre and post exercise glucose checks will be done for 3 sessions at entry of program.   Cardiac Rehab from 05/23/2020 in [facility] Cardiac and Pulmonary  Rehab  Date 03/21/20  Educator [facility]  Instruction Review Code 1- Verbalizes Understanding      Education: Know Your Numbers and Risk Factors: -Group verbal and written instruction about important numbers in your health.  Discussion of what are risk factors and how they play a role in the disease process.  Review of Cholesterol, Blood Pressure, Diabetes, and BMI and the role they play in your overall health.   Cardiac Rehab from 05/23/2020 in [facility] Cardiac and Pulmonary Rehab  Date 03/26/20  Instruction Review Code 3- Needs Reinforcement  [need identified]      Core Components/Risk Factors/Patient Goals Review:   Goals and Risk Factor Review    Row Name 05/30/20 1010             Core Components/Risk Factors/Patient Goals Review   Personal Goals Review Weight Management/Obesity;Hypertension;Lipids;Diabetes       Review Zylen takes his blood sugars at home once per day which have been stable. He also takes BP at home,  and reports 130  or lower for systolic below 80 for diastolic. BPs at rehab have also been in appropriate range.  He weighs himself every morning- stable with weight but wants to lose weight as one of his main goals. He same given up white bread and has consumed less simple carbs. He has a meeting with the RD soon to talk more about weight loss and establish nutrition goals.H feels his pants are big and shirt feels looser.       Expected Outcomes Short: Meet with RD to discuss weight loss goals Long: Continue to manage lifestyle risk factors              Core Components/Risk Factors/Patient Goals at Discharge (Final Review):   Goals and Risk Factor Review - 05/30/20 1010      Core Components/Risk Factors/Patient Goals Review   Personal Goals Review Weight Management/Obesity;Hypertension;Lipids;Diabetes    Review Lourdes takes his blood sugars at home once per day which have been stable. He also takes BP at home,  and reports 130  or lower for systolic below 80 for  diastolic. BPs at rehab have also been in appropriate range.  He weighs himself every morning- stable with weight but wants to lose weight as one of his main goals. He same given up  white bread and has consumed less simple carbs. He has a meeting with the RD soon to talk more about weight loss and establish nutrition goals.H feels his pants are big and shirt feels looser.    Expected Outcomes Short: Meet with RD to discuss weight loss goals Long: Continue to manage lifestyle risk factors           ITP Comments:  ITP Comments    Row Name 04/10/20 1423 04/11/20 1038 04/16/20 0908 04/25/20 1306 05/07/20 1040   ITP Comments 30 day review completed. ITP sent to Dr. Emily Filbert, Medical Director of Cardiac and Pulmonary Rehab. Continue with ITP unless changes are made by physician. Continues with his angina symptoms- he is aware of nitroglycerin use and calling 911 if meds do not control his symptoms Hehas called MD, is planning on changing cardiologists to have one closer to home. Doug called to let us know that he has had four episodes since last attending. He has called his doctor and they encouraged him to go to the emergency room.  He was hesitant about this so we talked about urgent care options as he does need to be seen.  He will get seen somewhere and will keep [place] updated. [name] has had a new stent placed and now seeing a new cardiologist.  He is out on hold until he follows up in the office prior to restarting rehab.  We will discharge him under the NSTEMI order and bring him back with his new stent with Dr. Rockey Situ once cleared to return. Completed 6MWT and gym orientation. Initial ITP created and sent for review to Dr. Emily Filbert, Medical Director.  Restarting with new stents and MD.   Row Name 05/08/20 6122 05/14/20 1148 05/14/20 1150 06/05/20 0608     ITP Comments 30 Day review completed. Medical Director ITP review done, changes made as directed, and signed approval by Medical Director. First  full day of exercise!  Patient was oriented to gym and equipment including functions, settings, policies, and procedures.  Patient's individual exercise prescription and treatment plan were reviewed.  All starting workloads were established based on the results of the 6 minute walk test done at initial orientation visit.  The plan for exercise progression was also introduced and progression will be customized based on patient's performance and goals. [name] is returning after new stent placed for persistent angina. 30 Day review completed. Medical Director ITP review done, changes made as directed, and signed approval by Medical Director.           Comments:

## 2020-06-06 ENCOUNTER — Encounter: Payer: PPO | Admitting: *Deleted

## 2020-06-06 ENCOUNTER — Other Ambulatory Visit: Payer: Self-pay

## 2020-06-06 DIAGNOSIS — Z955 Presence of coronary angioplasty implant and graft: Secondary | ICD-10-CM

## 2020-06-06 DIAGNOSIS — I214 Non-ST elevation (NSTEMI) myocardial infarction: Secondary | ICD-10-CM

## 2020-06-06 NOTE — Progress Notes (Signed)
Daily Session Note  Patient Details  Name: DEWITT JUDICE MRN: [id number] Date of Birth: 1950-11-05 Referring Provider:     Cardiac Rehab from 05/07/2020 in [facility] Cardiac and Pulmonary Rehab  Referring Provider [name] MD      Encounter Date: 06/06/2020  Check In:  Session Check In - 06/06/20 1037      Check-In   Supervising physician immediately available to respond to emergencies See telemetry face sheet for immediately available ER MD    Location [facility]-Cardiac & Pulmonary Rehab    Staff Present Renita Papa, RN BSN;Melissa Caiola RDN, LDN;Jessica Sayre, MA, RCEP, CCRP, CCET;Amanda Sommer, BA, ACSM CEP, Exercise Physiologist    Virtual Visit No    Medication changes reported     No    Fall or balance concerns reported    No    Warm-up and Cool-down Performed on first and last piece of equipment    Resistance Training Performed Yes    VAD Patient? No    PAD/SET Patient? No      Pain Assessment   Currently in Pain? No/denies              Social History   Tobacco Use  Smoking Status Former Smoker  . Packs/day: 1.00  . Years: 30.00  . Pack years: 30.00  . Types: Cigarettes  . Quit date: 08/23/2007  . Years since quitting: 12.7  Smokeless Tobacco Never Used    Goals Met:  Independence with exercise equipment Exercise tolerated well No report of cardiac concerns or symptoms Strength training completed today  Goals Unmet:  Not Applicable  Comments: Pt able to follow exercise prescription today without complaint.  Will continue to monitor for progression.    Dr. Emily Filbert is Medical Director for [place] and [name].

## 2020-06-11 ENCOUNTER — Other Ambulatory Visit: Payer: Self-pay

## 2020-06-11 ENCOUNTER — Encounter: Payer: PPO | Admitting: *Deleted

## 2020-06-11 DIAGNOSIS — Z955 Presence of coronary angioplasty implant and graft: Secondary | ICD-10-CM

## 2020-06-11 NOTE — Progress Notes (Signed)
Daily Session Note  Patient Details  Name: Jeremy Dorsey MRN: [id number] Date of Birth: 06-15-1951 Referring Provider:     Cardiac Rehab from 05/07/2020 in [facility] Cardiac and Pulmonary Rehab  Referring Provider [name] MD      Encounter Date: 06/11/2020  Check In:  Session Check In - 06/11/20 0959      Check-In   Supervising physician immediately available to respond to emergencies See telemetry face sheet for immediately available ER MD    Location [facility]-Cardiac & Pulmonary Rehab    Staff Present Heath Lark, RN, BSN, Lance Sell, BA, ACSM CEP, Exercise Physiologist;Kara Eliezer Bottom, MS Exercise Physiologist    Virtual Visit No    Medication changes reported     No    Fall or balance concerns reported    No    Warm-up and Cool-down Performed on first and last piece of equipment    Resistance Training Performed Yes    VAD Patient? No    PAD/SET Patient? No      Pain Assessment   Currently in Pain? No/denies              Social History   Tobacco Use  Smoking Status Former Smoker  . Packs/day: 1.00  . Years: 30.00  . Pack years: 30.00  . Types: Cigarettes  . Quit date: 08/23/2007  . Years since quitting: 12.8  Smokeless Tobacco Never Used    Goals Met:  Independence with exercise equipment Exercise tolerated well No report of cardiac concerns or symptoms  Goals Unmet:  Not Applicable  Comments: Pt able to follow exercise prescription today without complaint.  Will continue to monitor for progression.    Dr. Emily Filbert is Medical Director for [place] and [name].

## 2020-06-18 ENCOUNTER — Other Ambulatory Visit: Payer: Self-pay

## 2020-06-18 ENCOUNTER — Encounter: Payer: PPO | Admitting: *Deleted

## 2020-06-18 DIAGNOSIS — Z955 Presence of coronary angioplasty implant and graft: Secondary | ICD-10-CM

## 2020-06-18 NOTE — Progress Notes (Signed)
Daily Session Note  Patient Details  Name: Jeremy Dorsey MRN: [id number] Date of Birth: 1951-04-15 Referring Provider:     Cardiac Rehab from 05/07/2020 in [facility] Cardiac and Pulmonary Rehab  Referring Provider [name] MD      Encounter Date: 06/18/2020  Check In:  Session Check In - 06/18/20 1019      Check-In   Supervising physician immediately available to respond to emergencies See telemetry face sheet for immediately available ER MD    Location [facility]-Cardiac & Pulmonary Rehab    Staff Present Heath Lark, RN, BSN, CCRP;Amanda Sommer, BA, ACSM CEP, Exercise Physiologist;Kara Eliezer Bottom, MS Exercise Physiologist    Virtual Visit No    Medication changes reported     No    Fall or balance concerns reported    No    Warm-up and Cool-down Performed on first and last piece of equipment    Resistance Training Performed Yes    VAD Patient? No    PAD/SET Patient? No      Pain Assessment   Currently in Pain? No/denies              Social History   Tobacco Use  Smoking Status Former Smoker  . Packs/day: 1.00  . Years: 30.00  . Pack years: 30.00  . Types: Cigarettes  . Quit date: 08/23/2007  . Years since quitting: 12.8  Smokeless Tobacco Never Used    Goals Met:  Independence with exercise equipment Exercise tolerated well No report of cardiac concerns or symptoms  Goals Unmet:  Not Applicable  Comments: Pt able to follow exercise prescription today without complaint.  Will continue to monitor for progression.  Reviewed home exercise with pt today.  Pt plans to walk and use treadmill at home for exercise.  He also has a cross traininer that he can use at home. Reviewed THR, pulse, RPE, sign and symptoms, pulse oximetery and when to call 911 or MD.  Also discussed weather considerations and indoor options.  Pt voiced understanding.  Dr. Emily Filbert is Medical Director for [place] and [name].

## 2020-06-20 ENCOUNTER — Other Ambulatory Visit: Payer: Self-pay

## 2020-06-20 DIAGNOSIS — I214 Non-ST elevation (NSTEMI) myocardial infarction: Secondary | ICD-10-CM

## 2020-06-20 DIAGNOSIS — Z955 Presence of coronary angioplasty implant and graft: Secondary | ICD-10-CM | POA: Diagnosis not present

## 2020-06-20 NOTE — Progress Notes (Signed)
Daily Session Note  Patient Details  Name: Jeremy Dorsey MRN: [id number] Date of Birth: [birth year] Referring Provider:     Cardiac Rehab from 05/07/2020 in [facility] Cardiac and Pulmonary Rehab  Referring Provider [name] MD      Encounter Date: 06/20/2020  Check In:  Session Check In - 06/20/20 0942      Check-In   Supervising physician immediately available to respond to emergencies See telemetry face sheet for immediately available ER MD    Location [facility]-Cardiac & Pulmonary Rehab    Staff Present Birdie Sons, MPA, RN;Amanda Oletta Darter, BA, ACSM CEP, Exercise Physiologist;Melissa Caiola RDN, LDN    Virtual Visit No    Medication changes reported     No    Fall or balance concerns reported    No    Warm-up and Cool-down Performed on first and last piece of equipment    Resistance Training Performed Yes    VAD Patient? No    PAD/SET Patient? No      Pain Assessment   Currently in Pain? No/denies              Social History   Tobacco Use  Smoking Status Former Smoker  . Packs/day: 1.00  . Years: 30.00  . Pack years: 30.00  . Types: Cigarettes  . Quit date: 08/23/2007  . Years since quitting: 12.8  Smokeless Tobacco Never Used    Goals Met:  Independence with exercise equipment Exercise tolerated well No report of cardiac concerns or symptoms Strength training completed today  Goals Unmet:  Not Applicable  Comments: Pt able to follow exercise prescription today without complaint.  Will continue to monitor for progression.    Dr. Emily Filbert is Medical Director for [place] and [name].

## 2020-06-24 DIAGNOSIS — N184 Chronic kidney disease, stage 4 (severe): Secondary | ICD-10-CM | POA: Diagnosis not present

## 2020-06-24 DIAGNOSIS — E1122 Type 2 diabetes mellitus with diabetic chronic kidney disease: Secondary | ICD-10-CM | POA: Diagnosis not present

## 2020-06-24 DIAGNOSIS — I2510 Atherosclerotic heart disease of native coronary artery without angina pectoris: Secondary | ICD-10-CM | POA: Diagnosis not present

## 2020-06-24 DIAGNOSIS — E875 Hyperkalemia: Secondary | ICD-10-CM | POA: Diagnosis not present

## 2020-06-24 DIAGNOSIS — N2581 Secondary hyperparathyroidism of renal origin: Secondary | ICD-10-CM | POA: Diagnosis not present

## 2020-06-24 DIAGNOSIS — N3080 Other cystitis without hematuria: Secondary | ICD-10-CM | POA: Diagnosis not present

## 2020-06-24 DIAGNOSIS — Z6835 Body mass index (BMI) 35.0-35.9, adult: Secondary | ICD-10-CM | POA: Diagnosis not present

## 2020-06-24 DIAGNOSIS — I129 Hypertensive chronic kidney disease with stage 1 through stage 4 chronic kidney disease, or unspecified chronic kidney disease: Secondary | ICD-10-CM | POA: Diagnosis not present

## 2020-06-24 DIAGNOSIS — R809 Proteinuria, unspecified: Secondary | ICD-10-CM | POA: Diagnosis not present

## 2020-06-24 DIAGNOSIS — D7218 Eosinophilia in diseases classified elsewhere: Secondary | ICD-10-CM | POA: Diagnosis not present

## 2020-06-25 ENCOUNTER — Other Ambulatory Visit: Payer: Self-pay

## 2020-06-25 ENCOUNTER — Encounter: Payer: PPO | Admitting: *Deleted

## 2020-06-25 DIAGNOSIS — Z955 Presence of coronary angioplasty implant and graft: Secondary | ICD-10-CM

## 2020-06-25 NOTE — Progress Notes (Signed)
Daily Session Note  Patient Details  Name: Jeremy Dorsey MRN: [id number] Date of Birth: Apr 01, 1951 Referring Provider:     Cardiac Rehab from 05/07/2020 in [facility] Cardiac and Pulmonary Rehab  Referring Provider [name] MD      Encounter Date: 06/25/2020  Check In:  Session Check In - 06/25/20 1114      Check-In   Supervising physician immediately available to respond to emergencies See telemetry face sheet for immediately available ER MD    Location [facility]-Cardiac & Pulmonary Rehab    Staff Present Heath Lark, RN, BSN, CCRP;Jessica Macon, MA, RCEP, CCRP, Marylynn Pearson, MS Exercise Physiologist;Amanda Oletta Darter, [place], ACSM CEP, Exercise Physiologist    Virtual Visit No    Medication changes reported     No    Fall or balance concerns reported    No    Warm-up and Cool-down Performed on first and last piece of equipment    Resistance Training Performed Yes    VAD Patient? No    PAD/SET Patient? No      Pain Assessment   Currently in Pain? No/denies              Social History   Tobacco Use  Smoking Status Former Smoker  . Packs/day: 1.00  . Years: 30.00  . Pack years: 30.00  . Types: Cigarettes  . Quit date: 08/23/2007  . Years since quitting: 12.8  Smokeless Tobacco Never Used    Goals Met:  Independence with exercise equipment Exercise tolerated well No report of cardiac concerns or symptoms  Goals Unmet:  Not Applicable  Comments: Pt able to follow exercise prescription today without complaint.  Will continue to monitor for progression.    Dr. Emily Filbert is Medical Director for [place] and [name].

## 2020-07-01 NOTE — Progress Notes (Addendum)
Cardiology Office Note  Date:  07/02/2020   ID:  Jeremy Dorsey, DOB 06/10/1951, MRN [id number]  PCP:  Mar Daring, PA-C   Chief Complaint  Patient presents with  . other    2 month follow up. Meds reviewed by the pt. verbally. "doing well."     HPI:  [name] is a 69 year old gentleman with past medical history of Coronary artery disease hypertension diabetes type 2 Chronic kidney disease stage IV Morbid obesity Smoker, stopped 2009 Hyperlipidemia Presenting to the hospital March 01, 2020 with arm and jaw pain, non-STEMI Catheterization deferred secondary to concern for contrast nephropathy Peak troponin 2,200 He presents for f/u of  his coronary disease, s/p cath  Today he reports, denies any angina No leg swelling, no PND orthopnea, no abdominal distention  Discussed plans to be listed for kidney transplant  Catheterization results reviewed with him in detail Cath 04/24/2020  Ost RCA to Prox RCA lesion is 30% stenosed.  Ost LAD to Prox LAD lesion is 40% stenosed.  Prox LAD to Mid LAD lesion is 99% stenosed.  Post intervention, there is a 0% residual stenosis.  A drug-eluting stent was successfully placed using a STENT RESOLUTE ONYX 4.0X15.  Mid LAD lesion is 90% stenosed.  Post intervention, there is a 0% residual stenosis.  A drug-eluting stent was successfully placed using a STENT RESOLUTE ONYX 3.0X15.  Mid LAD to Dist LAD lesion is 60% stenosed.  Dist LAD lesion is 30% stenosed.   1.  Left dominant coronary arteries with severe one-vessel coronary artery disease involving proximal LAD with 99% stenosis and mid LAD with 90% stenosis.  There is moderate LAD disease in the mid to distal segment that was left to be treated medically. 2.  Left ventricular angiography was not performed due to chronic kidney disease.  LVEDP was moderately elevated at 25 mmHg. 3.  Successful IVUS guided PCI and drug-eluting stent placement to the mid and  proximal LAD.  Dual antiplatelet therapy for at least 1 year.  We discussed his slow worsening renal function, creatinine now 5 Continues to take Lasix 40 twice daily   EKG personally reviewed by myself on todays visit Shows normal sinus rhythm rate 74 bpm nonspecific ST abnormality  Lab work reviewed Creatinine 4.76 BUN 62 Total cholesterol 158 LDL 92 Hemoglobin A1c 7.3  Other past medical hx reviewed Stress test March 02, 2020  . Potential small area of pharmacologically induced ischemia involving the apex of the left ventricle. Clinical correlation is advised. 2. No scintigraphic evidence of prior infarction. 3. Mild global hypokinesia, slightly more conspicuously involving the basilar aspect of the inferior wall of the left ventricle. 4. Ejection fraction - 47%.  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  PSH:    Past Surgical History:  Procedure Laterality Date  . COLONOSCOPY  1990  . COLONOSCOPY  06/09/2011   Dr Bary Castilla  . CORONARY STENT INTERVENTION N/A 04/24/2020   Procedure: CORONARY STENT INTERVENTION;  Surgeon: Wellington Hampshire, MD;  Location: [place] [facility];  Service: Cardiovascular;  Laterality: N/A;  . CYSTOSCOPY W/ RETROGRADES Bilateral 08/01/2018   Procedure: CYSTOSCOPY WITH RETROGRADE PYELOGRAM;  Surgeon: Billey Co, MD;  Location: [facility];  Service: Urology;  Laterality: Bilateral;  . CYSTOSCOPY WITH BIOPSY N/A 08/01/2018  Procedure: CYSTOSCOPY WITH Bladder BIOPSY;  Surgeon: Billey Co, MD;  Location: [facility];  Service: Urology;  Laterality: N/A;  . CYSTOSCOPY WITH STENT PLACEMENT Right 08/01/2018   Procedure: CYSTOSCOPY WITH STENT PLACEMENT;  Surgeon: Billey Co, MD;  Location: [facility];  Service:  Urology;  Laterality: Right;  . EYE SURGERY Right    laser surgery  . INTRAVASCULAR ULTRASOUND/IVUS N/A 04/24/2020   Procedure: Intravascular Ultrasound/IVUS;  Surgeon: Wellington Hampshire, MD;  Location: [place] [facility];  Service: Cardiovascular;  Laterality: N/A;  . LEFT HEART CATH AND CORONARY ANGIOGRAPHY N/A 04/24/2020   Procedure: LEFT HEART CATH AND CORONARY ANGIOGRAPHY;  Surgeon: Wellington Hampshire, MD;  Location: [place] [facility];  Service: Cardiovascular;  Laterality: N/A;  . SHOULDER ARTHROSCOPY WITH OPEN ROTATOR CUFF REPAIR Right 08/25/2018   Procedure: SHOULDER ARTHROSCOPY WITH MINI OPEN ROTATOR CUFF REPAIR;  Surgeon: Thornton Park, MD;  Location: [facility];  Service: Orthopedics;  Laterality: Right;  . TRANSURETHRAL RESECTION OF BLADDER TUMOR N/A 08/01/2018   Procedure: TRANSURETHRAL RESECTION OF BLADDER TUMOR (TURBT);  Surgeon: Billey Co, MD;  Location: [facility];  Service: Urology;  Laterality: N/A;  . URETEROSCOPY WITH HOLMIUM LASER LITHOTRIPSY Right 08/01/2018   Procedure: URETEROSCOPY WITH HOLMIUM LASER LITHOTRIPSY;  Surgeon: Billey Co, MD;  Location: [facility];  Service: Urology;  Laterality: Right;    Current Outpatient Medications  Medication Sig Dispense Refill  . aspirin EC 81 MG EC tablet Take 1 tablet (81 mg total) by mouth daily. Swallow whole. 30 tablet 11  . atorvastatin (LIPITOR) 80 MG tablet Take 1 tablet (80 mg total) by mouth daily. 90 tablet 1  . calcitRIOL (ROCALTROL) 0.25 MCG capsule Take 0.5 mcg by mouth daily. (Prescription instructions say to take 2 capsules by mouth daily)    . clopidogrel (PLAVIX) 75 MG tablet Take 1 tablet (75 mg total) by mouth daily with breakfast. 90 tablet 1  . furosemide (LASIX) 40 MG tablet Take 40 mg by mouth 2 (two) times daily.    . metoprolol tartrate (LOPRESSOR) 25 MG tablet Take 1 tablet (25 mg total) by mouth 2 (two) times daily. 180 tablet 1  . nitroGLYCERIN (NITROSTAT) 0.4 MG SL tablet Place 1 tablet (0.4 mg  total) under the tongue every 5 (five) minutes x 3 doses as needed for chest pain. 25 tablet 12  . sitaGLIPtin (JANUVIA) 25 MG tablet Take 1 tablet (25 mg total) by mouth daily. 30 tablet 3  . sodium bicarbonate 650 MG tablet Take 650 mg by mouth 2 (two) times daily.     . sodium zirconium cyclosilicate (LOKELMA) 10 g PACK packet Take 10 g by mouth 3 (three) times a week.     No current facility-administered medications for this visit.     Allergies:   Oxycodone and Hydrocodone   Social History:  The patient  reports that he quit smoking about 12 years ago. His smoking use included cigarettes. He has a 30.00 pack-year smoking history. He has never used smokeless tobacco. He reports that he does not drink alcohol and does not use drugs.   Family History:   family history includes Heart failure in his father; Psoriasis in his mother.    Review of Systems: Review of Systems  Constitutional: Negative.   HENT: Negative.   Respiratory: Negative.   Cardiovascular: Positive for chest pain.  Gastrointestinal: Negative.   Musculoskeletal: Negative.   Neurological: Negative.   Psychiatric/Behavioral: Negative.   All other systems reviewed and are  negative.   PHYSICAL EXAM: VS:  BP 140/72 (BP Location: Left Arm, Patient Position: Sitting, Cuff Size: Normal)   Pulse 71   Ht 5\' 10"  (1.778 m)   Wt 231 lb 6 oz (105 kg)   SpO2 98%   BMI 33.20 kg/m  , BMI Body mass index is 33.2 kg/m. GEN: Well nourished, well developed, in no acute distress obese HEENT: normal Neck: no JVD, carotid bruits, or masses Cardiac: RRR; no murmurs, rubs, or gallops,no edema  Respiratory:  clear to auscultation bilaterally, normal work of breathing GI: soft, nontender, nondistended, + BS MS: no deformity or atrophy Skin: warm and dry, no rash Neuro:  Strength and sensation are intact Psych: euthymic mood, full affect   Recent Labs: 04/29/2020: BUN 87; Creatinine, Ser 5.35; Hemoglobin 10.9; Platelets 158;  Potassium 4.9; Sodium 138    Lipid Panel Lab Results  Component Value Date   CHOL 137 03/02/2020   HDL 28 (L) 03/02/2020   LDLCALC 88 03/02/2020   TRIG 106 03/02/2020      Wt Readings from Last 3 Encounters:  07/02/20 231 lb 6 oz (105 kg)  05/22/20 233 lb 12.8 oz (106.1 kg)  05/07/20 231 lb 6.4 oz (105 kg)       ASSESSMENT AND PLAN:  Problem List Items Addressed This Visit      Cardiology Problems   Essential hypertension   CAD (coronary artery disease), native coronary artery - Primary     Other   Type 2 diabetes mellitus, without long-term current use of insulin (HCC)    Other Visit Diagnoses    Hyperlipidemia LDL goal <70       Chronic kidney disease (CKD), stage IV (severe) (HCC)         Unstable angina Stent x2 placed in the LAD proximal mid region Continue aspirin Plavix No further testing needed, moderate residual disease mid LAD discussed with him  Diabetes type 2 with complications Improvement in his numbers through medication compliance, dietary discretion  Essential hypertension Blood pressure is well controlled on today's visit. No changes made to the medications.  Hyperlipidemia On Lipitor 80 daily Numbers at goal  End-stage renal disease Followed by nephrology Not a good candidate at this time to hold his Plavix given recent stent placement   Signed, Esmond Plants, M.D., Ph.D. [place], [place]

## 2020-07-02 ENCOUNTER — Other Ambulatory Visit: Payer: Self-pay

## 2020-07-02 ENCOUNTER — Encounter: Payer: Self-pay | Admitting: Cardiovascular Disease

## 2020-07-02 ENCOUNTER — Ambulatory Visit: Payer: PPO | Admitting: Cardiovascular Disease

## 2020-07-02 VITALS — BP 140/72 | HR 71 | Ht 70.0 in | Wt 231.4 lb

## 2020-07-02 DIAGNOSIS — Z955 Presence of coronary angioplasty implant and graft: Secondary | ICD-10-CM

## 2020-07-02 DIAGNOSIS — E785 Hyperlipidemia, unspecified: Secondary | ICD-10-CM

## 2020-07-02 DIAGNOSIS — I10 Essential (primary) hypertension: Secondary | ICD-10-CM

## 2020-07-02 DIAGNOSIS — E1159 Type 2 diabetes mellitus with other circulatory complications: Secondary | ICD-10-CM | POA: Diagnosis not present

## 2020-07-02 DIAGNOSIS — I25118 Atherosclerotic heart disease of native coronary artery with other forms of angina pectoris: Secondary | ICD-10-CM | POA: Diagnosis not present

## 2020-07-02 DIAGNOSIS — N184 Chronic kidney disease, stage 4 (severe): Secondary | ICD-10-CM | POA: Diagnosis not present

## 2020-07-02 DIAGNOSIS — I214 Non-ST elevation (NSTEMI) myocardial infarction: Secondary | ICD-10-CM

## 2020-07-02 NOTE — Progress Notes (Signed)
Daily Session Note  Patient Details  Name: OTTIS VACHA MRN: [id number] Date of Birth: [birth year] Referring Provider:     Cardiac Rehab from 05/07/2020 in [facility] Cardiac and Pulmonary Rehab  Referring Provider [name] MD      Encounter Date: 07/02/2020  Check In:  Session Check In - 07/02/20 1110      Check-In   Supervising physician immediately available to respond to emergencies See telemetry face sheet for immediately available ER MD    Location [facility]-Cardiac & Pulmonary Rehab    Staff Present Basilia Jumbo, RN, BSN;Melissa Caiola RDN, LDN;Joseph Lou Miner, MS Exercise Physiologist;Amanda Oletta Darter, [place], ACSM CEP, Exercise Physiologist    Virtual Visit No    Medication changes reported     No    Fall or balance concerns reported    No    Tobacco Cessation No Change    Warm-up and Cool-down Performed on first and last piece of equipment    Resistance Training Performed Yes    VAD Patient? No    PAD/SET Patient? No      Pain Assessment   Currently in Pain? No/denies              Social History   Tobacco Use  Smoking Status Former Smoker  . Packs/day: 1.00  . Years: 30.00  . Pack years: 30.00  . Types: Cigarettes  . Quit date: 08/23/2007  . Years since quitting: 12.8  Smokeless Tobacco Never Used    Goals Met:  Independence with exercise equipment Exercise tolerated well Strength training completed today  Goals Unmet:  Not Applicable  Comments: Pt able to follow exercise prescription today without complaint.  Will continue to monitor for progression.    Dr. Emily Filbert is Medical Director for [place] and [name].

## 2020-07-02 NOTE — Patient Instructions (Signed)
Medication Instructions:  No changes  If you need a refill on your cardiac medications before your next appointment, please call your pharmacy.    Lab work: No new labs needed   If you have labs (blood work) drawn today and your tests are completely normal, you will receive your results only by: . MyChart Message (if you have MyChart) OR . A paper copy in the mail If you have any lab test that is abnormal or we need to change your treatment, we will call you to review the results.   Testing/Procedures: No new testing needed   Follow-Up: At [facility], you and your health needs are our priority.  As part of our continuing mission to provide you with exceptional heart care, we have created designated Provider Care Teams.  These Care Teams include your primary Cardiologist (physician) and Advanced Practice Providers (APPs -  Physician Assistants and Nurse Practitioners) who all work together to provide you with the care you need, when you need it.  . You will need a follow up appointment in 6 months  . Providers on your designated Care Team:   . Christopher Berge, NP . Ryan Dunn, PA-C . Jacquelyn Visser, PA-C  Any Other Special Instructions Will Be Listed Below (If Applicable).  COVID-19 Vaccine Information can be found at: [email or web address] For questions related to vaccine distribution or [email or web address] please email [email or web address] or call [phone]     

## 2020-07-03 ENCOUNTER — Encounter: Payer: Self-pay | Admitting: *Deleted

## 2020-07-03 DIAGNOSIS — Z955 Presence of coronary angioplasty implant and graft: Secondary | ICD-10-CM

## 2020-07-03 NOTE — Progress Notes (Signed)
Cardiac Individual Treatment Plan  Patient Details  Name: Jeremy Dorsey MRN: [id number] Date of Birth: May 30, 1951 Referring Provider:     Cardiac Rehab from 05/07/2020 in [facility] Cardiac and Pulmonary Rehab  Referring Provider [name] MD      Initial Encounter Date:    Cardiac Rehab from 05/07/2020 in [facility] Cardiac and Pulmonary Rehab  Date 05/07/20      Visit Diagnosis: Status post coronary artery stent placement  Patient's Home Medications on Admission:  Current Outpatient Medications:  .  aspirin 81 MG EC tablet, Take 1 tablet (81 mg total) by mouth daily. Swallow whole., Disp: 30 tablet, Rfl: 11 .  atorvastatin (LIPITOR) 80 MG tablet, Take 1 tablet (80 mg total) by mouth daily., Disp: 90 tablet, Rfl: 1 .  calcitRIOL (ROCALTROL) 0.25 MCG capsule, Take 0.5 mcg by mouth daily. (Prescription instructions say to take 2 capsules by mouth daily), Disp: , Rfl:  .  clopidogrel (PLAVIX) 75 MG tablet, Take 1 tablet (75 mg total) by mouth daily with breakfast., Disp: 90 tablet, Rfl: 1 .  furosemide (LASIX) 40 MG tablet, Take 40 mg by mouth 2 (two) times daily., Disp: , Rfl:  .  metoprolol tartrate (LOPRESSOR) 25 MG tablet, Take 1 tablet (25 mg total) by mouth 2 (two) times daily., Disp: 180 tablet, Rfl: 1 .  nitroGLYCERIN (NITROSTAT) 0.4 MG SL tablet, Place 1 tablet (0.4 mg total) under the tongue every 5 (five) minutes x 3 doses as needed for chest pain., Disp: 25 tablet, Rfl: 12 .  sitaGLIPtin (JANUVIA) 25 MG tablet, Take 1 tablet (25 mg total) by mouth daily., Disp: 30 tablet, Rfl: 3 .  sodium bicarbonate 650 MG tablet, Take 650 mg by mouth 2 (two) times daily. , Disp: , Rfl:  .  sodium zirconium cyclosilicate (LOKELMA) 10 g PACK packet, Take 10 g by mouth 3 (three) times a week., Disp: , Rfl:   Past Medical History: Past Medical History:  Diagnosis Date  . Anemia   . CKD (chronic kidney disease), stage IV ([place])   . Coronary artery disease    a. s/p IVUS-guided DESx2 to prox &  mid LAD, residual disease treated medically. EF 50-55% by recent echo 02/2020.  . 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 Hyperlipidemia LDL goal <70   . Hypertension 2008  . Lower extremity edema   . Shoulder pain    Right  . Urinary complication     Tobacco Use: Social History   Tobacco Use  Smoking Status Former Smoker  . Packs/day: 1.00  . Years: 30.00  . Pack years: 30.00  . Types: Cigarettes  . Quit date: 08/23/2007  . Years since quitting: 12.8  Smokeless Tobacco Never Used    Labs: Recent Chemical engineer    Labs for ITP Cardiac and Pulmonary Rehab Latest Ref Rng & Units 02/22/2017 06/10/2018 03/01/2020 03/02/2020 05/22/2020   Cholestrol 0 - 200 mg/dL - - 158 137 -   LDLCALC 0 - 99 mg/dL - - 92 88 -   HDL >40 mg/dL - - 31(L) 28(L) -   Trlycerides <150 mg/dL - - 173(H) 106 -   Hemoglobin A1c 4.0 - 5.6 % 9.1 8.0(A) 7.3(H) - 7.3(A)       Exercise Target Goals: Exercise Program Goal: Individual exercise prescription set using results from initial 6 min walk test and THRR while considering  patient's activity barriers and safety.   Exercise Prescription Goal: Initial exercise prescription builds to 30-45 minutes a day of  aerobic activity, 2-3 days per week.  Home exercise guidelines will be given to patient during program as part of exercise prescription that the participant will acknowledge.   Education: Aerobic Exercise: - Group verbal and visual presentation on the components of exercise prescription. Introduces F.I.T.T principle from ACSM for exercise prescriptions.  Reviews F.I.T.T. principles of aerobic exercise including progression. Written material given at graduation.   Cardiac Rehab from 06/25/2020 in [facility] Cardiac and Pulmonary Rehab  Date 05/23/20  Educator [facility]  Instruction Review Code 1- Verbalizes Understanding      Education: Resistance Exercise: - Group verbal and visual presentation on the components of exercise prescription. Introduces  F.I.T.T principle from ACSM for exercise prescriptions  Reviews F.I.T.T. principles of resistance exercise including progression. Written material given at graduation.    Education: Exercise & Equipment Safety: - Individual verbal instruction and demonstration of equipment use and safety with use of the equipment.   Cardiac Rehab from 06/25/2020 in [facility] Cardiac and Pulmonary Rehab  Date 03/21/20  Educator [facility]  Instruction Review Code 1- Verbalizes Understanding      Education: Exercise Physiology & General Exercise Guidelines: - Group verbal and written instruction with models to review the exercise physiology of the cardiovascular system and associated critical values. Provides general exercise guidelines with specific guidelines to those with heart or lung disease.    Cardiac Rehab from 06/25/2020 in [facility] Cardiac and Pulmonary Rehab  Date 03/26/20  Instruction Review Code 3- Needs Reinforcement  [need identified]      Education: Flexibility, Balance, Mind/Body Relaxation: - Group verbal and visual presentation with interactive activity on the components of exercise prescription. Introduces F.I.T.T principle from ACSM for exercise prescriptions. Reviews F.I.T.T. principles of flexibility and balance exercise training including progression. Also discusses the mind body connection.  Reviews various relaxation techniques to help reduce and manage stress (i.e. Deep breathing, progressive muscle relaxation, and visualization). Balance handout provided to take home. Written material given at graduation.   Cardiac Rehab from 06/25/2020 in [facility] Cardiac and Pulmonary Rehab  Date 06/06/20  Educator [facility]  Instruction Review Code 1- Verbalizes Understanding      Activity Barriers & Risk Stratification:  Activity Barriers & Cardiac Risk Stratification - 05/07/20 1041      Activity Barriers & Cardiac Risk Stratification   Activity Barriers Joint Problems;Deconditioning;Muscular Weakness;Shortness of  Breath;Balance Concerns;History of Falls;Other (comment)    Comments pain in legs with walking (claudication? no PAD Dx)    Cardiac Risk Stratification High           6 Minute Walk:  6 Minute Walk    Row Name 05/07/20 1040         6 Minute Walk   Phase Initial     Distance 925 feet     Walk Time 6 minutes     # of Rest Breaks 0     MPH 1.75     METS 1.87     RPE 13     VO2 Peak 6.5     Symptoms Yes (comment)     Comments pain in calves 9/10, legs fatigued     Resting HR 73 bpm     Resting BP 128/60     Resting Oxygen Saturation  96 %     Exercise Oxygen Saturation  during 6 min walk 99 %     Max Ex. HR 99 bpm     Max Ex. BP 132/70     2 Minute Post BP 122/62  Oxygen Initial Assessment:   Oxygen Re-Evaluation:   Oxygen Discharge (Final Oxygen Re-Evaluation):   Initial Exercise Prescription:  Initial Exercise Prescription - 05/07/20 1000      Date of Initial Exercise RX and Referring Provider   Date 05/07/20    Referring Provider [name] MD      Treadmill   MPH 1.7    Grade 0    Minutes 15    METs 2.3      Recumbant Bike   Level 1    RPM 50    Watts 5    Minutes 15    METs 2      NuStep   Level 1    SPM 80    Minutes 15    METs 2      REL-XR   Level 1    Speed 50    Minutes 15    METs 2      Prescription Details   Frequency (times per week) 2    Duration Progress to 30 minutes of continuous aerobic without signs/symptoms of physical distress      Intensity   THRR 40-80% of Max Heartrate 104-135    Ratings of Perceived Exertion 11-13    Perceived Dyspnea 0-4      Progression   Progression Continue to progress workloads to maintain intensity without signs/symptoms of physical distress.      Resistance Training   Training Prescription Yes    Weight 3 lb    Reps 10-15           Perform Capillary Blood Glucose checks as needed.  Exercise Prescription Changes:  Exercise Prescription Changes    Row Name  05/07/20 1000 05/20/20 1200 06/05/20 1400 06/18/20 1000 06/19/20 1200     Response to Exercise   Blood Pressure (Admit) 128/60 122/68 126/66 -- 124/64   Blood Pressure (Exercise) 132/70 132/64 124/72 -- 142/64   Blood Pressure (Exit) 122/62 128/72 120/70 -- 130/68   Heart Rate (Admit) 73 bpm 76 bpm 71 bpm -- 86 bpm   Heart Rate (Exercise) 99 bpm 110 bpm 105 bpm -- 108 bpm   Heart Rate (Exit) 74 bpm 91 bpm 77 bpm -- 90 bpm   Oxygen Saturation (Admit) 96 % -- -- -- --   Oxygen Saturation (Exercise) 99 % -- -- -- --   Rating of Perceived Exertion (Exercise) 13 12 13  -- 15   Symptoms legs fatigued, calve pain 9/10 -- none -- --   Comments walk test results first day -- -- --   Duration -- Progress to 30 minutes of  aerobic without signs/symptoms of physical distress Continue with 30 min of aerobic exercise without signs/symptoms of physical distress. -- Continue with 30 min of aerobic exercise without signs/symptoms of physical distress.   Intensity -- THRR unchanged THRR unchanged -- THRR unchanged     Progression   Progression -- Continue to progress workloads to maintain intensity without signs/symptoms of physical distress. Continue to progress workloads to maintain intensity without signs/symptoms of physical distress. -- Continue to progress workloads to maintain intensity without signs/symptoms of physical distress.   Average METs -- 2.2 2.06 -- 2.2     Resistance Training   Training Prescription -- Yes Yes -- Yes   Weight -- 3 lb 4 lb -- 5 lb   Reps -- 10-15 10-15 -- 10-15     Interval Training   Interval Training -- -- No -- No     Treadmill   MPH --  2.5 1.7 -- 1.7   Grade -- 0.5 0.5 -- 0.5   Minutes -- 15 15 -- 15   METs -- 3.09 2.42 -- 2.42     REL-XR   Level -- 3 1 -- 3   Speed -- 50 -- -- 50   Minutes -- 15 15 -- 15   METs -- 1.3 1.7 -- --     Home Exercise Plan   Plans to continue exercise at -- -- -- Home (comment)  walking, treadmill, cross trainer Home (comment)   walking, treadmill, cross trainer   Frequency -- -- -- Add 3 additional days to program exercise sessions. Add 3 additional days to program exercise sessions.   Initial Home Exercises Provided -- -- -- 06/18/20 06/18/20          Exercise Comments:  Exercise Comments    Row Name 05/14/20 1148           Exercise Comments First full day of exercise!  Patient was oriented to gym and equipment including functions, settings, policies, and procedures.  Patient's individual exercise prescription and treatment plan were reviewed.  All starting workloads were established based on the results of the 6 minute walk test done at initial orientation visit.  The plan for exercise progression was also introduced and progression will be customized based on patient's performance and goals.              Exercise Goals and Review:   Exercise Goals Re-Evaluation :  Exercise Goals Re-Evaluation    Row Name 05/14/20 1148 05/30/20 1006 06/05/20 1413 06/18/20 1028 06/19/20 1258     Exercise Goal Re-Evaluation   Exercise Goals Review Able to understand and use rate of perceived exertion (RPE) scale;Knowledge and understanding of Target Heart Rate Range (THRR);Understanding of Exercise Prescription Able to understand and use rate of perceived exertion (RPE) scale;Knowledge and understanding of Target Heart Rate Range (THRR);Understanding of Exercise Prescription Increase Physical Activity;Increase Strength and Stamina;Understanding of Exercise Prescription Increase Physical Activity;Increase Strength and Stamina;Understanding of Exercise Prescription Increase Physical Activity;Increase Strength and Stamina;Understanding of Exercise Prescription   Comments Reviewed RPE and dyspnea scales, THR and program prescription with pt today.  Pt voiced understanding and was given a copy of goals to take home. Jeremy Dorsey does not do any exercise at home as of yet. He recent;y came back to rehab after he had a stent placed and  feels much better, he rarely experiences chest pain and feels confident to start exercise at home. He has 3 different machines he can chose from. EP will review home exercise in the next couple of weeks. Jeremy Dorsey is doing well in rehab.  He is now up to 1.7 METs on the recumbent elliptical and up to 4lb hand weights.  He should be ready to start to increase his workloads. Jeremy Dorsey is doing well in rehab.  He has started to add in some exericse at home.  He is not sure how fast he is going on his treadmill so we talked about using his heart rate and RPE to help gauge his intensity. Reviewed home exercise with pt today.  Pt plans to walk and use treadmill at home for exercise.  He also has a cross traininer that he can use at home. Reviewed THR, pulse, RPE, sign and symptoms, pulse oximetery and when to call 911 or MD.  Also discussed weather considerations and indoor options.  Pt voiced understanding. Jeremy Dorsey is doing well in rehab.  He has started to add in  some exericse at home.  He is not sure how fast he is going on his treadmill so we talked about using his heart rate and RPE to help gauge his intensity. Reviewed home exercise with pt today.  Pt plans to walk and use treadmill at home for exercise.  He also has a cross traininer that he can use at home. Reviewed THR, pulse, RPE, sign and symptoms, pulse oximetery and when to call 911 or MD.  Also discussed weather considerations and indoor options.  Pt voiced understanding.   Expected Outcomes Short: Use RPE daily to regulate intensity. Long: Follow program prescription in THR. Short: Review home exercise and expectations Long: Able to exercise independently at home with no complications Short: Increase workloads Long: Continue to improve stamina Short: Continue to add in exercise at home and start using heart rate to judge  Long: Continue to improve stamina. Short: Continue to add in exercise at home and start using heart rate to judge  Long: Continue to improve stamina.           Discharge Exercise Prescription (Final Exercise Prescription Changes):  Exercise Prescription Changes - 06/19/20 1200      Response to Exercise   Blood Pressure (Admit) 124/64    Blood Pressure (Exercise) 142/64    Blood Pressure (Exit) 130/68    Heart Rate (Admit) 86 bpm    Heart Rate (Exercise) 108 bpm    Heart Rate (Exit) 90 bpm    Rating of Perceived Exertion (Exercise) 15    Duration Continue with 30 min of aerobic exercise without signs/symptoms of physical distress.    Intensity THRR unchanged      Progression   Progression Continue to progress workloads to maintain intensity without signs/symptoms of physical distress.    Average METs 2.2      Resistance Training   Training Prescription Yes    Weight 5 lb    Reps 10-15      Interval Training   Interval Training No      Treadmill   MPH 1.7    Grade 0.5    Minutes 15    METs 2.42      REL-XR   Level 3    Speed 50    Minutes 15      Home Exercise Plan   Plans to continue exercise at Home (comment)   walking, treadmill, cross trainer   Frequency Add 3 additional days to program exercise sessions.    Initial Home Exercises Provided 06/18/20           Nutrition:  Target Goals: Understanding of nutrition guidelines, daily intake of sodium 1500mg , cholesterol 200mg , calories 30% from fat and 7% or less from saturated fats, daily to have 5 or more servings of fruits and vegetables.  Education: All About Nutrition: -Group instruction provided by verbal, written material, interactive activities, discussions, models, and posters to present general guidelines for heart healthy nutrition including fat, fiber, MyPlate, the role of sodium in heart healthy nutrition, utilization of the nutrition label, and utilization of this knowledge for meal planning. Follow up email sent as well. Written material given at graduation.   Cardiac Rehab from 06/25/2020 in [facility] Cardiac and Pulmonary Rehab  Date 04/11/20   Educator [facility]  Instruction Review Code 1- Verbalizes Understanding  [need identified]      Biometrics:  Pre Biometrics - 05/07/20 1044      Pre Biometrics   Height 5' 9.1" (1.755 m)    Weight 231 lb  6.4 oz (105 kg)    BMI (Calculated) 34.08    Single Leg Stand 5.2 seconds            Nutrition Therapy Plan and Nutrition Goals:  Nutrition Therapy & Goals - 06/04/20 0925      Nutrition Therapy   Diet Heart healthy, low Na, CKD stg 4 MNT    Drug/Food Interactions Statins/Certain Fruits    Protein (specify units) 85g    Fiber 30 grams    Whole Grain Foods 3 servings    Saturated Fats 12 max. grams    Fruits and Vegetables 8 servings/day    Sodium 1.5 grams      Personal Nutrition Goals   Nutrition Goal ST: try canned salmon and beans (bean salad) to switch up protein. Add walnuts to breakfast or boild egg ~2x/week to add protein.  LT: get a variety of vegetables with moderate potassium intake, get a variety of protein foods    Comments No salt since July, not bread at everymeal, no red meat for 4 months. He is lactose intolerant. B: Bowl of life cereal with almond milk with water L: chicken sandwich or [place] - whole wheat bread - sometimes no bread S: unsweet tea D: salads with sliced [place], soups (homemade chicken noodle - reduced salt, vegetable beef soup, clear broth clam "chowder"), pork chops 1x/month, wife doesn't like fish - will get when out to eat, chicken and [place] most of the time. Will have brown rice when he has grains. Vegetables: he loves green beans, corn, brussels sprouts, he doesn't like beets, okra, or asparagus. Uses extra virgin olive oil  or pam spray. He doesn't like canned tuna that he doesn't like. No medication for diabetes, BG is well controlled. 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 Discussed heart healthy and T2DM eating. He would like to get a wider variety of protein.      Intervention Plan   Intervention Prescribe, educate and counsel regarding  individualized specific dietary modifications aiming towards targeted core components such as weight, hypertension, lipid management, diabetes, heart failure and other comorbidities.;Nutrition handout(s) given to patient.    Expected Outcomes Short Term Goal: Understand basic principles of dietary content, such as calories, fat, sodium, cholesterol and nutrients.;Short Term Goal: A plan has been developed with personal nutrition goals set during dietitian appointment.;Long Term Goal: Adherence to prescribed nutrition plan.           Nutrition Assessments:  MEDIFICTS Score Key:  ?70 Need to make dietary changes   40-70 Heart Healthy Diet  ? 40 Therapeutic Level Cholesterol Diet   Picture Your Plate Scores:  <19 Unhealthy dietary pattern with much room for improvement.  41-50 Dietary pattern unlikely to meet recommendations for good health and room for improvement.  51-60 More healthful dietary pattern, with some room for improvement.   >60 Healthy dietary pattern, although there may be some specific behaviors that could be improved.    Nutrition Goals Re-Evaluation:  Nutrition Goals Re-Evaluation    [place] Name 06/18/20 1006             Goals   Nutrition Goal ST: try canned salmon and beans (bean salad) to switch up protein. Add walnuts to breakfast or boild egg ~2x/week to add protein.  LT: get a variety of vegetables with moderate potassium intake, get a variety of protein foods       Comment Jeremy Dorsey is doing well with his diet.  He has not tried the bean  salad and prefers fresh fish versus canned.  He did admit to hamburger in his spaghetti.  He prefers chicken to read meat.  He eats a lot of salds and tries for two vegetables at each meal.       Expected Outcome Short: Continue to to add vareity  Long: Continue to focus on healthier eating.              Nutrition Goals Discharge (Final Nutrition Goals Re-Evaluation):  Nutrition Goals Re-Evaluation - 06/18/20 1006       Goals   Nutrition Goal ST: try canned salmon and beans (bean salad) to switch up protein. Add walnuts to breakfast or boild egg ~2x/week to add protein.  LT: get a variety of vegetables with moderate potassium intake, get a variety of protein foods    Comment Jeremy Dorsey is doing well with his diet.  He has not tried the bean salad and prefers fresh fish versus canned.  He did admit to hamburger in his spaghetti.  He prefers chicken to read meat.  He eats a lot of salds and tries for two vegetables at each meal.    Expected Outcome Short: Continue to to add vareity  Long: Continue to focus on healthier eating.           Psychosocial: Target Goals: Acknowledge presence or absence of significant depression and/or stress, maximize coping skills, provide positive support system. Participant is able to verbalize types and ability to use techniques and skills needed for reducing stress and depression.   Education: Stress, Anxiety, and Depression - Group verbal and visual presentation to define topics covered.  Reviews how body is impacted by stress, anxiety, and depression.  Also discusses healthy ways to reduce stress and to treat/manage anxiety and depression.  Written material given at graduation.   Education: Sleep Hygiene -Provides group verbal and written instruction about how sleep can affect your health.  Define sleep hygiene, discuss sleep cycles and impact of sleep habits. Review good sleep hygiene tips.    Initial Review & Psychosocial Screening:   Quality of Life Scores:   Scores of 19 and below usually indicate a poorer quality of life in these areas.  A difference of  2-3 points is a clinically meaningful difference.  A difference of 2-3 points in the total score of the Quality of Life Index has been associated with significant improvement in overall quality of life, self-image, physical symptoms, and general health in studies assessing change in quality of life.  PHQ-9: Recent Review  Flowsheet Data    Depression screen [facility] 2/9 05/07/2020 03/26/2020 05/24/2019   Decreased Interest 0 1 0   Down, Depressed, Hopeless 0 1 0   PHQ - 2 Score 0 2 0   Altered sleeping 1 3 -   Tired, decreased energy 1 2 -   Change in appetite 1 3 -   Feeling bad or failure about yourself  0 0 -   Trouble concentrating 0 0 -   Moving slowly or fidgety/restless 0 0 -   Suicidal thoughts 0 0 -   PHQ-9 Score 3 10 -   Difficult doing work/chores Not difficult at all Not difficult at all -     Interpretation of Total Score  Total Score Depression Severity:  1-4 = Minimal depression, 5-9 = Mild depression, 10-14 = Moderate depression, 15-19 = Moderately severe depression, 20-27 = Severe depression   Psychosocial Evaluation and Intervention:   Psychosocial Re-Evaluation:  Psychosocial Re-Evaluation    Row  Name 05/30/20 1016 06/18/20 1001           Psychosocial Re-Evaluation   Current issues with Current Sleep Concerns Current Sleep Concerns;Current Stress Concerns      Comments Jeremy Dorsey is doing well mentally. He stated he has not slept well since a while ago since he is up a lot to go to the bathroom in the middle of the night. He has already talked to his doctor about it and has declined taking any sleep-aid or medications to help with sleep. Denied big stressors or anxiety related events. He has great support from his wife. He is feeling much better since he has his stent placed as he rarely feels chest pain. Therefore, he feels he can go out and do more which he is happy about. Jeremy Dorsey is doing well in rehab.  He is doing well mentally.  He takes it one day at a time and deals with things as they come.  He tries not to worry about anything and just does what he can.  He would love uninterrupted sleep, but his fluid keeps him getting up at night to go to bathroom.  He is able to get back to sleep afterwards pretty easily.  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  He had a diffucult  time with one group of kids acting out and cursing.  But he took control and asked for them to refrain from bad behavior and language.      Expected Outcomes Short: Continue attending rehab consistently Long: Utilize exercise for stress management and maintain positive attitude Short: Continue to sleep as best as possible Long; Continue to stay positive.      Interventions Encouraged to attend Cardiac Rehabilitation for the exercise Encouraged to attend Cardiac Rehabilitation for the exercise      Continue Psychosocial Services  Follow up required by staff Follow up required by staff             Psychosocial Discharge (Final Psychosocial Re-Evaluation):  Psychosocial Re-Evaluation - 06/18/20 1001      Psychosocial Re-Evaluation   Current issues with Current Sleep Concerns;Current Stress Concerns    Comments Jeremy Dorsey is doing well in rehab.  He is doing well mentally.  He takes it one day at a time and deals with things as they come.  He tries not to worry about anything and just does what he can.  He would love uninterrupted sleep, but his fluid keeps him getting up at night to go to bathroom.  He is able to get back to sleep afterwards pretty easily.  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  But he took control and asked for them to refrain from bad behavior and language.    Expected Outcomes Short: Continue to sleep as best as possible Long; Continue to stay positive.    Interventions Encouraged to attend Cardiac Rehabilitation for the exercise    Continue Psychosocial Services  Follow up required by staff           Vocational Rehabilitation: Provide vocational rehab assistance to qualifying candidates.   Vocational Rehab Evaluation & Intervention:   Education: Education Goals: Education classes will be provided on a variety of topics geared toward better understanding of heart health and risk  factor modification. Participant will state understanding/return demonstration of topics presented as noted by education  test scores.  Learning Barriers/Preferences:   General Cardiac Education Topics:  AED/CPR: - Group verbal and written instruction with the use of models to demonstrate the basic use of the AED with the basic ABC's of resuscitation.   Anatomy and Cardiac Procedures: - Group verbal and visual presentation and models provide information about basic cardiac anatomy and function. Reviews the testing methods done to diagnose heart disease and the outcomes of the test results. Describes the treatment choices: Medical Management, Angioplasty, or Coronary Bypass Surgery for treating various heart conditions including Myocardial Infarction, Angina, Valve Disease, and Cardiac Arrhythmias.  Written material given at graduation.   Cardiac Rehab from 06/25/2020 in [facility] Cardiac and Pulmonary Rehab  Date 03/26/20  Instruction Review Code 3- Needs Reinforcement  [need identified]      Medication Safety: - Group verbal and visual instruction to review commonly prescribed medications for heart and lung disease. Reviews the medication, class of the drug, and side effects. Includes the steps to properly store meds and maintain the prescription regimen.  Written material given at graduation.   Intimacy: - Group verbal instruction through game format to discuss how heart and lung disease can affect sexual intimacy. Written material given at graduation..   Cardiac Rehab from 06/25/2020 in [facility] Cardiac and Pulmonary Rehab  Date 05/23/20  Educator [facility]  Instruction Review Code 1- Verbalizes Understanding      Know Your Numbers and Heart Failure: - Group verbal and visual instruction to discuss disease risk factors for cardiac and pulmonary disease and treatment options.  Reviews associated critical values for Overweight/Obesity, Hypertension, Cholesterol, and Diabetes.  Discusses basics of  heart failure: signs/symptoms and treatments.  Introduces Heart Failure Zone chart for action plan for heart failure.  Written material given at graduation.   Cardiac Rehab from 06/25/2020 in [facility] Cardiac and Pulmonary Rehab  Date 06/25/20  Educator [facility]  Instruction Review Code 1- Verbalizes Understanding      Infection Prevention: - Provides verbal and written material to individual with discussion of infection control including proper hand washing and proper equipment cleaning during exercise session.   Cardiac Rehab from 06/25/2020 in [facility] Cardiac and Pulmonary Rehab  Date 03/21/20  Educator [facility]  Instruction Review Code 1- Verbalizes Understanding      Falls Prevention: - Provides verbal and written material to individual with discussion of falls prevention and safety.   Cardiac Rehab from 06/25/2020 in [facility] Cardiac and Pulmonary Rehab  Date 03/21/20  Educator [facility]  Instruction Review Code 1- Verbalizes Understanding      Other: -Provides group and verbal instruction on various topics (see comments)   Knowledge Questionnaire Score:   Core Components/Risk Factors/Patient Goals at Admission:   Education:Diabetes - Individual verbal and written instruction to review signs/symptoms of diabetes, desired ranges of glucose level fasting, after meals and with exercise. Acknowledge that pre and post exercise glucose checks will be done for 3 sessions at entry of program.   Cardiac Rehab from 06/25/2020 in [facility] Cardiac and Pulmonary Rehab  Date 03/21/20  Educator [facility]  Instruction Review Code 1- Verbalizes Understanding      Core Components/Risk Factors/Patient Goals Review:   Goals and Risk Factor Review    Row Name 05/30/20 1010 06/18/20 1003           Core Components/Risk Factors/Patient Goals Review   Personal Goals Review Weight Management/Obesity;Hypertension;Lipids;Diabetes Weight Management/Obesity;Hypertension;Diabetes      Review Jeremy Dorsey takes his blood sugars at  home once per day which have been stable. He  also takes BP at home,  and reports 130  or lower for systolic below 80 for diastolic. BPs at rehab have also been in appropriate range.  He weighs himself every morning- stable with weight but wants to lose weight as one of his main goals. He same given up white bread and has consumed less simple carbs. He has a meeting with the RD soon to talk more about weight loss and establish nutrition goals.H feels his pants are big and shirt feels looser. Jeremy Dorsey is doing well in rehab.  His weight is staying steady. He would like to lose more, reviewed exercise today so hopefully adding some in will be helpful.  His left calf pain has resolved but his right calf still has a slight knot.  He is feeling a lot better.  His blood pressures have been doing well and staying below 150s.  He has also gotten away from using salt.  His sugars are doing well and he reports they are good. His A1c is down to 7.3!!  He is pleased with his progress.  He is doing well with his medications overall.The [place] is doing better now at the lower dose.      Expected Outcomes Short: Meet with RD to discuss weight loss goals Long: Continue to manage lifestyle risk factors Short: Continue to work on weight loss long; Continue to monitor risk factors.             Core Components/Risk Factors/Patient Goals at Discharge (Final Review):   Goals and Risk Factor Review - 06/18/20 1003      Core Components/Risk Factors/Patient Goals Review   Personal Goals Review Weight Management/Obesity;Hypertension;Diabetes    Review Jeremy Dorsey is doing well in rehab.  His weight is staying steady. He would like to lose more, reviewed exercise today so hopefully adding some in will be helpful.  His left calf pain has resolved but his right calf still has a slight knot.  He is feeling a lot better.  His blood pressures have been doing well and staying below 150s.  He has also gotten away from using salt.  His sugars are  doing well and he reports they are good. His A1c is down to 7.3!!  He is pleased with his progress.  He is doing well with his medications overall.The [place] is doing better now at the lower dose.    Expected Outcomes Short: Continue to work on weight loss long; Continue to monitor risk factors.           ITP Comments:  ITP Comments    Row Name 05/07/20 1040 05/08/20 0608 05/14/20 1148 05/14/20 1150 06/05/20 0608   ITP Comments Completed 6MWT and gym orientation. Initial ITP created and sent for review to Dr. Emily Filbert, Medical Director.  Restarting with new stents and MD. 30 Day review completed. Medical Director ITP review done, changes made as directed, and signed approval by Medical Director. First full day of exercise!  Patient was oriented to gym and equipment including functions, settings, policies, and procedures.  Patient's individual exercise prescription and treatment plan were reviewed.  All starting workloads were established based on the results of the 6 minute walk test done at initial orientation visit.  The plan for exercise progression was also introduced and progression will be customized based on patient's performance and goals. Jeremy Dorsey is returning after new stent placed for persistent angina. 30 Day review completed. Medical Director ITP review done, changes made as directed, and signed approval by Medical  Director.   [place] Name 07/03/20 0907           ITP Comments 30 Day review completed. Medical Director ITP review done, changes made as directed, and signed approval by Medical Director.              Comments:

## 2020-07-04 ENCOUNTER — Encounter: Payer: PPO | Attending: Cardiology

## 2020-07-04 ENCOUNTER — Other Ambulatory Visit: Payer: Self-pay

## 2020-07-04 DIAGNOSIS — Z955 Presence of coronary angioplasty implant and graft: Secondary | ICD-10-CM | POA: Insufficient documentation

## 2020-07-04 DIAGNOSIS — I214 Non-ST elevation (NSTEMI) myocardial infarction: Secondary | ICD-10-CM | POA: Diagnosis not present

## 2020-07-04 NOTE — Progress Notes (Signed)
Daily Session Note  Patient Details  Name: Jeremy Dorsey MRN: [id number] Date of Birth: 10/17/1950 Referring Provider:     Cardiac Rehab from 05/07/2020 in [facility] Cardiac and Pulmonary Rehab  Referring Provider [name] MD      Encounter Date: 07/04/2020  Check In:  Session Check In - 07/04/20 1045      Check-In   Supervising physician immediately available to respond to emergencies See telemetry face sheet for immediately available ER MD    Location [facility]-Cardiac & Pulmonary Rehab    Staff Present Birdie Sons, MPA, RN;Melissa Caiola RDN, Rowe Pavy, BA, ACSM CEP, Exercise Physiologist;Meredith Sherryll Burger, RN BSN    Virtual Visit No    Medication changes reported     No    Fall or balance concerns reported    No    Tobacco Cessation No Change    Warm-up and Cool-down Performed on first and last piece of equipment    Resistance Training Performed Yes    VAD Patient? No    PAD/SET Patient? No      Pain Assessment   Currently in Pain? No/denies              Social History   Tobacco Use  Smoking Status Former Smoker  . Packs/day: 1.00  . Years: 30.00  . Pack years: 30.00  . Types: Cigarettes  . Quit date: 08/23/2007  . Years since quitting: 12.8  Smokeless Tobacco Never Used    Goals Met:  Independence with exercise equipment Exercise tolerated well No report of cardiac concerns or symptoms Strength training completed today  Goals Unmet:  Not Applicable  Comments: Pt able to follow exercise prescription today without complaint.  Will continue to monitor for progression.    Dr. Emily Filbert is Medical Director for [facility] and [name].

## 2020-07-05 ENCOUNTER — Other Ambulatory Visit: Payer: Self-pay

## 2020-07-05 DIAGNOSIS — N185 Chronic kidney disease, stage 5: Secondary | ICD-10-CM

## 2020-07-11 ENCOUNTER — Other Ambulatory Visit: Payer: Self-pay

## 2020-07-11 DIAGNOSIS — Z955 Presence of coronary angioplasty implant and graft: Secondary | ICD-10-CM

## 2020-07-11 DIAGNOSIS — I214 Non-ST elevation (NSTEMI) myocardial infarction: Secondary | ICD-10-CM

## 2020-07-11 NOTE — Progress Notes (Signed)
Daily Session Note  Patient Details  Name: ODDIE BOTTGER MRN: [id number] Date of Birth: [birth year] Referring Provider:   Flowsheet Row Cardiac Rehab from 05/07/2020 in [facility] Cardiac and Pulmonary Rehab  Referring Provider [name] MD      Encounter Date: 07/11/2020  Check In:  Session Check In - 07/11/20 0946      Check-In   Supervising physician immediately available to respond to emergencies See telemetry face sheet for immediately available ER MD    Location [facility]-Cardiac & Pulmonary Rehab    Staff Present Birdie Sons, MPA, RN;Melissa Caiola RDN, Rowe Pavy, BA, ACSM CEP, Exercise Physiologist;Jessica Pecos, MA, RCEP, CCRP, CCET    Virtual Visit No    Medication changes reported     No    Fall or balance concerns reported    No    Tobacco Cessation No Change    Warm-up and Cool-down Performed on first and last piece of equipment    Resistance Training Performed Yes    VAD Patient? No    PAD/SET Patient? No      Pain Assessment   Currently in Pain? No/denies              Social History   Tobacco Use  Smoking Status Former Smoker  . Packs/day: 1.00  . Years: 30.00  . Pack years: 30.00  . Types: Cigarettes  . Quit date: 08/23/2007  . Years since quitting: 12.8  Smokeless Tobacco Never Used    Goals Met:  Independence with exercise equipment Exercise tolerated well Personal goals reviewed No report of cardiac concerns or symptoms Strength training completed today  Goals Unmet:  Not Applicable  Comments: Pt able to follow exercise prescription today without complaint.  Will continue to monitor for progression.    Dr. Emily Filbert is Medical Director for [name].

## 2020-07-12 ENCOUNTER — Ambulatory Visit (HOSPITAL_COMMUNITY)
Admission: RE | Admit: 2020-07-12 | Discharge: 2020-07-12 | Disposition: A | Discharge status: Home / Self Care | Payer: PPO | Source: Ambulatory Visit | Attending: Vascular Surgery | Admitting: Vascular Surgery

## 2020-07-12 ENCOUNTER — Ambulatory Visit (INDEPENDENT_AMBULATORY_CARE_PROVIDER_SITE_OTHER)
Admission: RE | Admit: 2020-07-12 | Discharge: 2020-07-12 | Disposition: A | Discharge status: Home / Self Care | Payer: PPO | Source: Ambulatory Visit | Attending: Vascular Surgery | Admitting: Vascular Surgery

## 2020-07-12 ENCOUNTER — Encounter: Payer: Self-pay | Admitting: Vascular Surgery

## 2020-07-12 ENCOUNTER — Other Ambulatory Visit: Payer: Self-pay

## 2020-07-12 ENCOUNTER — Ambulatory Visit: Payer: PPO | Admitting: Vascular Surgery

## 2020-07-12 VITALS — BP 159/96 | HR 74 | Temp 98.2°F | Resp 20 | Ht 70.0 in | Wt 230.0 lb

## 2020-07-12 DIAGNOSIS — N185 Chronic kidney disease, stage 5: Secondary | ICD-10-CM

## 2020-07-12 NOTE — Progress Notes (Signed)
Patient ID: Jeremy Dorsey, male   DOB: Oct 23, 1950, 69 y.o.   MRN: [id number]  Reason for Consult: New Patient (Initial Visit)   Referred by Mar Daring, P*  Subjective:     HPI:  Jeremy Dorsey is a 69 y.o. male right-hand-dominant never been on dialysis.  Recent underwent coronary artery stenting currently on aspirin Plavix.  Risk factors for vascular disease include diabetes, hyperlipidemia, hypertension.  He has never had left upper extremity or chest chest surgery does not have a pacemaker defibrillator.  Past Medical History:  Diagnosis Date  . Anemia   . CKD (chronic kidney disease), stage IV ([place])   . Coronary artery disease    a. s/p IVUS-guided DESx2 to prox & mid LAD, residual disease treated medically. EF 50-55% by recent echo 02/2020.  . 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 Hyperlipidemia LDL goal <70   . Hypertension 2008  . Lower extremity edema   . Shoulder pain    Right  . Urinary complication    Family History  Problem Relation Age of Onset  . Psoriasis Mother   . Heart failure Father    Past Surgical History:  Procedure Laterality Date  . COLONOSCOPY  1990  . COLONOSCOPY  06/09/2011   Dr Bary Castilla  . CORONARY STENT INTERVENTION N/A 04/24/2020   Procedure: CORONARY STENT INTERVENTION;  Surgeon: Wellington Hampshire, MD;  Location: [facility];  Service: Cardiovascular;  Laterality: N/A;  . CYSTOSCOPY W/ RETROGRADES Bilateral 08/01/2018   Procedure: CYSTOSCOPY WITH RETROGRADE PYELOGRAM;  Surgeon: Billey Co, MD;  Location: [facility] OR[facility];  Service: Urology;  Laterality: Bilateral;  . CYSTOSCOPY WITH BIOPSY N/A 08/01/2018   Procedure: CYSTOSCOPY WITH Bladder BIOPSY;  Surgeon: Billey Co, MD;  Location: [facility];  Service: Urology;  Laterality: N/A;  . CYSTOSCOPY WITH STENT PLACEMENT Right 08/01/2018   Procedure: CYSTOSCOPY WITH STENT PLACEMENT;  Surgeon: Billey Co, MD;  Location: [facility];  Service: Urology;   Laterality: Right;  . EYE SURGERY Right    laser surgery  . INTRAVASCULAR ULTRASOUND/IVUS N/A 04/24/2020   Procedure: Intravascular Ultrasound/IVUS;  Surgeon: Wellington Hampshire, MD;  Location: [place] [facility];  Service: Cardiovascular;  Laterality: N/A;  . LEFT HEART CATH AND CORONARY ANGIOGRAPHY N/A 04/24/2020   Procedure: LEFT HEART CATH AND CORONARY ANGIOGRAPHY;  Surgeon: Wellington Hampshire, MD;  Location: [facility];  Service: Cardiovascular;  Laterality: N/A;  . SHOULDER ARTHROSCOPY WITH OPEN ROTATOR CUFF REPAIR Right 08/25/2018   Procedure: SHOULDER ARTHROSCOPY WITH MINI OPEN ROTATOR CUFF REPAIR;  Surgeon: Thornton Park, MD;  Location: [facility];  Service: Orthopedics;  Laterality: Right;  . TRANSURETHRAL RESECTION OF BLADDER TUMOR N/A 08/01/2018   Procedure: TRANSURETHRAL RESECTION OF BLADDER TUMOR (TURBT);  Surgeon: Billey Co, MD;  Location: [facility];  Service: Urology;  Laterality: N/A;  . URETEROSCOPY WITH HOLMIUM LASER LITHOTRIPSY Right 08/01/2018   Procedure: URETEROSCOPY WITH HOLMIUM LASER LITHOTRIPSY;  Surgeon: Billey Co, MD;  Location: [facility] OR[facility];  Service: Urology;  Laterality: Right;    Short Social History:  Social History   Tobacco Use  . Smoking status: Former Smoker    Packs/day: 1.00    Years: 30.00    Pack years: 30.00    Types: Cigarettes    Quit date: 08/23/2007    Years since quitting: 12.8  . Smokeless tobacco: Never Used  Substance Use Topics  . Alcohol use: No    Allergies  Allergen Reactions  .  Oxycodone Nausea And Vomiting  . Hydrocodone Nausea And Vomiting    Current Outpatient Medications  Medication Sig Dispense Refill  . aspirin 81 MG EC tablet Take 1 tablet (81 mg total) by mouth daily. Swallow whole. 30 tablet 11  . atorvastatin (LIPITOR) 80 MG tablet Take 1 tablet (80 mg total) by mouth daily. 90 tablet 1  . calcitRIOL (ROCALTROL) 0.25 MCG capsule Take 0.5 mcg by mouth daily. (Prescription instructions say to take 2  capsules by mouth daily)    . clopidogrel (PLAVIX) 75 MG tablet Take 1 tablet (75 mg total) by mouth daily with breakfast. 90 tablet 1  . furosemide (LASIX) 40 MG tablet Take 40 mg by mouth 2 (two) times daily.    . metoprolol tartrate (LOPRESSOR) 25 MG tablet Take 1 tablet (25 mg total) by mouth 2 (two) times daily. 180 tablet 1  . nitroGLYCERIN (NITROSTAT) 0.4 MG SL tablet Place 1 tablet (0.4 mg total) under the tongue every 5 (five) minutes x 3 doses as needed for chest pain. 25 tablet 12  . sitaGLIPtin (JANUVIA) 25 MG tablet Take 1 tablet (25 mg total) by mouth daily. 30 tablet 3  . sodium bicarbonate 650 MG tablet Take 650 mg by mouth 2 (two) times daily.     . sodium zirconium cyclosilicate (LOKELMA) 10 g PACK packet Take 10 g by mouth 3 (three) times a week.     No current facility-administered medications for this visit.    Review of Systems  Constitutional:  Constitutional negative. HENT: HENT negative.  Eyes: Eyes negative.  Respiratory: Respiratory negative.  Cardiovascular: Cardiovascular negative.  GI: Gastrointestinal negative.  Musculoskeletal: Musculoskeletal negative.  Neurological: Neurological negative. Hematologic: Hematologic/lymphatic negative.  Psychiatric: Psychiatric negative.        Objective:  Objective   Vitals:   07/12/20 1501  BP: (!) 159/96  Pulse: 74  Resp: 20  Temp: 98.2 F (36.8 C)  SpO2: 98%  Weight: 230 lb (104.3 kg)  Height: 5\' 10"  (1.778 m)   Body mass index is 33 kg/m.  Physical Exam HENT:     Head: Normocephalic.     Nose:     Comments: Wearing a mask Eyes:     Pupils: Pupils are equal, round, and reactive to light.  Cardiovascular:     Rate and Rhythm: Normal rate.     Pulses: Normal pulses.  Pulmonary:     Effort: Pulmonary effort is normal.  Abdominal:     General: Abdomen is flat.     Palpations: Abdomen is soft.  Musculoskeletal:        General: No swelling. Normal range of motion.     Cervical back: Neck supple.   Skin:    General: Skin is warm and dry.     Capillary Refill: Capillary refill takes less than 2 seconds.  Neurological:     General: No focal deficit present.     Mental Status: He is alert.  Psychiatric:        Mood and Affect: Mood normal.        Behavior: Behavior normal.        Thought Content: Thought content normal.        Judgment: Judgment normal.     Data: I have independently interpreted his bilateral upper extremity vein mapping.  He appears to have suitable right cephalic vein throughout the upper extremity and suitable basilic vein above the antecubitum.  On the left side he has suitable cephalic vein throughout and basilic vein also  above the antecubital space.  I have independently interpreted his upper extremity with patent bilateral brachial arteries on the right 0.52 cm and left 0.45 cm.  Radial artery on the right 0.27 and left 0.27 cm     Assessment/Plan:     69 year old male with chronic kidney disease.  We discussed his options for dialysis access which include catheter, fistula, graft.  He appears to have suitable vein in his nondominant left upper extremity.  We will plan for a left upper extremity fistula in the near future.  Patient would like to wait till after the new year and he will call to schedule    Waynetta Sandy MD [facility]

## 2020-07-16 ENCOUNTER — Other Ambulatory Visit: Payer: Self-pay

## 2020-07-16 ENCOUNTER — Encounter: Payer: PPO | Admitting: *Deleted

## 2020-07-16 ENCOUNTER — Telehealth: Payer: Self-pay | Admitting: Cardiovascular Disease

## 2020-07-16 DIAGNOSIS — Z955 Presence of coronary angioplasty implant and graft: Secondary | ICD-10-CM

## 2020-07-16 NOTE — Telephone Encounter (Signed)
   [name] Pre-operative Risk Assessment    HEARTCARE STAFF: - Please ensure there is not already an duplicate clearance open for this procedure. - Under Visit Info/Reason for Call, type in Other and utilize the format Clearance MM/DD/YY or Clearance TBD. Do not use dashes or single digits. - If request is for dental extraction, please clarify the # of teeth to be extracted.  Request for surgical clearance:  1. 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 When is this surgery scheduled? tbd patient will be on standby timeframe unknown    3. What type of clearance is required (medical clearance vs. Pharmacy clearance to hold med vs. Both)? Both   4. Are there any medications that need to be held prior to surgery and how long? Unknown at this time   5. Practice name and name of physician performing surgery? [facility] Dr. Inocente Dorsey   6. What is the office phone number? [phone]   7.   What is the office fax number? [phone]  8.   Anesthesia type (None, local, MAC, general) ? Unknown patient still being evaluated for transplant list    Jeremy Dorsey 07/16/2020, 10:39 AM  _________________________________________________________________   (provider comments below)

## 2020-07-16 NOTE — Progress Notes (Signed)
Daily Session Note  Patient Details  Name: Jeremy Dorsey MRN: [id number] Date of Birth: Aug 25, 1950 Referring Provider:   Flowsheet Row Cardiac Rehab from 05/07/2020 in [facility] Cardiac and Pulmonary Rehab  Referring Provider [name] MD      Encounter Date: 07/16/2020  Check In:  Session Check In - 07/16/20 1033      Check-In   Supervising physician immediately available to respond to emergencies See telemetry face sheet for immediately available ER MD    Location [facility]-Cardiac & Pulmonary Rehab    Staff Present Heath Lark, RN, BSN, Jacklynn Bue, MS Exercise Physiologist;Amanda Oletta Darter, [place], ACSM CEP, Exercise Physiologist    Virtual Visit No    Medication changes reported     No    Fall or balance concerns reported    No    Warm-up and Cool-down Performed on first and last piece of equipment    Resistance Training Performed Yes    VAD Patient? No    PAD/SET Patient? No      Pain Assessment   Currently in Pain? No/denies              Social History   Tobacco Use  Smoking Status Former Smoker   Packs/day: 1.00   Years: 30.00   Pack years: 30.00   Types: Cigarettes   Quit date: 08/23/2007   Years since quitting: 12.9  Smokeless Tobacco Never Used    Goals Met:  Independence with exercise equipment Exercise tolerated well No report of cardiac concerns or symptoms  Goals Unmet:  Not Applicable  Comments: Pt able to follow exercise prescription today without complaint.  Will continue to monitor for progression.    Dr. Emily Filbert is Medical Director for [name].

## 2020-07-16 NOTE — Telephone Encounter (Signed)
Dr. Rockey Situ This patient had PCI 04/2020 with continued symptoms of chest pain. You mentioned no additional testing during his visit 07/02/20 and that he should not hold plavix at this time.   We are being asked for clearance for kidney transplant on a "stand-by timeframe."  They request medical clearance and to hold plavix.   Can you please provide guidance for clearance and the earliest possible date he may hold plavix?   I tried to reach out to the office to clarify "stand-by" and how this would relate to holding plavix for any length of time - was unable to reach the office.

## 2020-07-17 DIAGNOSIS — L538 Other specified erythematous conditions: Secondary | ICD-10-CM | POA: Diagnosis not present

## 2020-07-17 DIAGNOSIS — L570 Actinic keratosis: Secondary | ICD-10-CM | POA: Diagnosis not present

## 2020-07-17 DIAGNOSIS — L820 Inflamed seborrheic keratosis: Secondary | ICD-10-CM | POA: Diagnosis not present

## 2020-07-17 DIAGNOSIS — D692 Other nonthrombocytopenic purpura: Secondary | ICD-10-CM | POA: Diagnosis not present

## 2020-07-17 DIAGNOSIS — D2272 Melanocytic nevi of left lower limb, including hip: Secondary | ICD-10-CM | POA: Diagnosis not present

## 2020-07-17 DIAGNOSIS — D225 Melanocytic nevi of trunk: Secondary | ICD-10-CM | POA: Diagnosis not present

## 2020-07-17 DIAGNOSIS — X32XXXA Exposure to sunlight, initial encounter: Secondary | ICD-10-CM | POA: Diagnosis not present

## 2020-07-17 DIAGNOSIS — D2261 Melanocytic nevi of right upper limb, including shoulder: Secondary | ICD-10-CM | POA: Diagnosis not present

## 2020-07-17 DIAGNOSIS — Z85828 Personal history of other malignant neoplasm of skin: Secondary | ICD-10-CM | POA: Diagnosis not present

## 2020-07-17 DIAGNOSIS — D2262 Melanocytic nevi of left upper limb, including shoulder: Secondary | ICD-10-CM | POA: Diagnosis not present

## 2020-07-18 NOTE — Telephone Encounter (Signed)
Please advise on placing [name] on the transplant list.  Office at [facility] stating that he did not need Plavix to be held.  Thank you.  [name]. Nester Bachus NP-C 07/18/2020, [place] [address] Office [phone] Fax [phone]

## 2020-07-18 NOTE — Telephone Encounter (Signed)
Lannette Donath from [facility] states they just need clearance for the patient to be put on the transplant list. She states they do not need clearance to hold plavix at this time.

## 2020-07-23 ENCOUNTER — Other Ambulatory Visit: Payer: Self-pay

## 2020-07-23 ENCOUNTER — Encounter: Payer: PPO | Admitting: *Deleted

## 2020-07-23 DIAGNOSIS — Z955 Presence of coronary angioplasty implant and graft: Secondary | ICD-10-CM | POA: Diagnosis not present

## 2020-07-23 NOTE — Progress Notes (Signed)
Daily Session Note  Patient Details  Name: Jeremy Dorsey MRN: [id number] Date of Birth: 23-Oct-1950 Referring Provider:   Flowsheet Row Cardiac Rehab from 05/07/2020 in [facility] Cardiac and Pulmonary Rehab  Referring Provider [name] MD      Encounter Date: 07/23/2020  Check In:  Session Check In - 07/23/20 1014      Check-In   Supervising physician immediately available to respond to emergencies See telemetry face sheet for immediately available ER MD    Location [facility]-Cardiac & Pulmonary Rehab    Staff Present Heath Lark, RN, BSN, Jacklynn Bue, MS Exercise Physiologist;Jessica Moab, MA, RCEP, CCRP, CCET    Virtual Visit No    Medication changes reported     No    Fall or balance concerns reported    No    Warm-up and Cool-down Performed on first and last piece of equipment    Resistance Training Performed Yes    VAD Patient? No    PAD/SET Patient? No      Pain Assessment   Currently in Pain? No/denies              Social History   Tobacco Use  Smoking Status Former Smoker  . Packs/day: 1.00  . Years: 30.00  . Pack years: 30.00  . Types: Cigarettes  . Quit date: 08/23/2007  . Years since quitting: 12.9  Smokeless Tobacco Never Used    Goals Met:  Independence with exercise equipment Exercise tolerated well No report of cardiac concerns or symptoms  Goals Unmet:  Not Applicable  Comments: Pt able to follow exercise prescription today without complaint.  Will continue to monitor for progression.    Dr. Emily Filbert is Medical Director for [place] and [name].

## 2020-07-23 NOTE — Telephone Encounter (Signed)
   Primary Cardiologist: [name], MD  Chart reviewed as part of pre-operative protocol coverage. Given past medical history and time since last visit, based on ACC/AHA guidelines, Jeremy Dorsey would be at acceptable risk for the planned procedure without further cardiovascular testing.   He may be listed on transplant list.  Stable from cardiac standpoint following most recent PCI.  He will need to continue clopidogrel.  I will route this recommendation to the requesting party via Epic fax function and remove from pre-op pool.  Please call with questions.  [name]. [name] NP-C    07/23/2020, 10:20 AM [address] Office [phone] Fax [phone]

## 2020-07-23 NOTE — Telephone Encounter (Signed)
Ok to be listed, acceptable risk from cardiac perspective Was not having unstable angina following recent PCI Would not stop plavix

## 2020-07-30 ENCOUNTER — Encounter: Payer: PPO | Attending: Cardiovascular Disease | Admitting: *Deleted

## 2020-07-30 ENCOUNTER — Other Ambulatory Visit: Payer: Self-pay

## 2020-07-30 DIAGNOSIS — Z955 Presence of coronary angioplasty implant and graft: Secondary | ICD-10-CM | POA: Diagnosis not present

## 2020-07-30 NOTE — Progress Notes (Signed)
Daily Session Note  Patient Details  Name: Jeremy Dorsey MRN: [id number] Date of Birth: 1951/02/11 Referring Provider:   Flowsheet Row Cardiac Rehab from 05/07/2020 in [facility] Cardiac and Pulmonary Rehab  Referring Provider [name] MD      Encounter Date: 07/30/2020  Check In:  Session Check In - 07/30/20 1041      Check-In   Staff Present Melissa Caiola RDN, Rowe Pavy, BA, ACSM CEP, Exercise Physiologist;Joseph [place] [employer];Heath Lark, RN, BSN, CCRP    Virtual Visit No    Medication changes reported     No    Fall or balance concerns reported    No    Warm-up and Cool-down Performed on first and last piece of equipment    Resistance Training Performed Yes    VAD Patient? No    PAD/SET Patient? No      Pain Assessment   Currently in Pain? No/denies              Social History   Tobacco Use  Smoking Status Former Smoker   Packs/day: 1.00   Years: 30.00   Pack years: 30.00   Types: Cigarettes   Quit date: 08/23/2007   Years since quitting: 12.9  Smokeless Tobacco Never Used    Goals Met:  Independence with exercise equipment Exercise tolerated well No report of cardiac concerns or symptoms  Goals Unmet:  Not Applicable  Comments: Pt able to follow exercise prescription today without complaint.  Will continue to monitor for progression.    Dr. Emily Filbert is Medical Director for [place] [name].

## 2020-07-31 ENCOUNTER — Encounter: Payer: Self-pay | Admitting: *Deleted

## 2020-07-31 DIAGNOSIS — Z955 Presence of coronary angioplasty implant and graft: Secondary | ICD-10-CM

## 2020-07-31 NOTE — Progress Notes (Signed)
Cardiac Individual Treatment Plan  Patient Details  Name: Jeremy Dorsey MRN: [id number] Date of Birth: [birth year] Referring Provider:   Flowsheet Row Cardiac Rehab from 05/07/2020 in [facility] Cardiac and Pulmonary Rehab  Referring Provider [name] MD      Initial Encounter Date:  Flowsheet Row Cardiac Rehab from 05/07/2020 in [facility] Cardiac and Pulmonary Rehab  Date 05/07/20      Visit Diagnosis: Status post coronary artery stent placement  Patient's Home Medications on Admission:  Current Outpatient Medications:  .  aspirin 81 MG EC tablet, Take 1 tablet (81 mg total) by mouth daily. Swallow whole., Disp: 30 tablet, Rfl: 11 .  atorvastatin (LIPITOR) 80 MG tablet, Take 1 tablet (80 mg total) by mouth daily., Disp: 90 tablet, Rfl: 1 .  calcitRIOL (ROCALTROL) 0.25 MCG capsule, Take 0.5 mcg by mouth daily. (Prescription instructions say to take 2 capsules by mouth daily), Disp: , Rfl:  .  clopidogrel (PLAVIX) 75 MG tablet, Take 1 tablet (75 mg total) by mouth daily with breakfast., Disp: 90 tablet, Rfl: 1 .  furosemide (LASIX) 40 MG tablet, Take 40 mg by mouth 2 (two) times daily., Disp: , Rfl:  .  metoprolol tartrate (LOPRESSOR) 25 MG tablet, Take 1 tablet (25 mg total) by mouth 2 (two) times daily., Disp: 180 tablet, Rfl: 1 .  nitroGLYCERIN (NITROSTAT) 0.4 MG SL tablet, Place 1 tablet (0.4 mg total) under the tongue every 5 (five) minutes x 3 doses as needed for chest pain., Disp: 25 tablet, Rfl: 12 .  sitaGLIPtin (JANUVIA) 25 MG tablet, Take 1 tablet (25 mg total) by mouth daily., Disp: 30 tablet, Rfl: 3 .  sodium bicarbonate 650 MG tablet, Take 650 mg by mouth 2 (two) times daily. , Disp: , Rfl:  .  sodium zirconium cyclosilicate (LOKELMA) 10 g PACK packet, Take 10 g by mouth 3 (three) times a week., Disp: , Rfl:   Past Medical History: Past Medical History:  Diagnosis Date  . Anemia   . CKD (chronic kidney disease), stage IV ([place])   . Coronary artery disease    a. s/p  IVUS-guided DESx2 to prox & mid LAD, residual disease treated medically. EF 50-55% by recent echo 02/2020.  . 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 Hyperlipidemia LDL goal <70   . Hypertension 2008  . Lower extremity edema   . Shoulder pain    Right  . Urinary complication     Tobacco Use: Social History   Tobacco Use  Smoking Status Former Smoker  . Packs/day: 1.00  . Years: 30.00  . Pack years: 30.00  . Types: Cigarettes  . Quit date: 08/23/2007  . Years since quitting: 12.9  Smokeless Tobacco Never Used    Labs: Recent Chemical engineer    Labs for ITP Cardiac and Pulmonary Rehab Latest Ref Rng & Units 02/22/2017 06/10/2018 03/01/2020 03/02/2020 05/22/2020   Cholestrol 0 - 200 mg/dL - - 158 137 -   LDLCALC 0 - 99 mg/dL - - 92 88 -   HDL >40 mg/dL - - 31(L) 28(L) -   Trlycerides <150 mg/dL - - 173(H) 106 -   Hemoglobin A1c 4.0 - 5.6 % 9.1 8.0(A) 7.3(H) - 7.3(A)       Exercise Target Goals: Exercise Program Goal: Individual exercise prescription set using results from initial 6 min walk test and THRR while considering  patient's activity barriers and safety.   Exercise Prescription Goal: Initial exercise prescription builds to 30-45 minutes a day of  aerobic activity, 2-3 days per week.  Home exercise guidelines will be given to patient during program as part of exercise prescription that the participant will acknowledge.   Education: Aerobic Exercise: - Group verbal and visual presentation on the components of exercise prescription. Introduces F.I.T.T principle from ACSM for exercise prescriptions.  Reviews F.I.T.T. principles of aerobic exercise including progression. Written material given at graduation. Flowsheet Row Cardiac Rehab from 07/11/2020 in [facility] Cardiac and Pulmonary Rehab  Date 05/23/20  Educator [facility]  Instruction Review Code 1- Verbalizes Understanding      Education: Resistance Exercise: - Group verbal and visual presentation on the  components of exercise prescription. Introduces F.I.T.T principle from ACSM for exercise prescriptions  Reviews F.I.T.T. principles of resistance exercise including progression. Written material given at graduation.    Education: Exercise & Equipment Safety: - Individual verbal instruction and demonstration of equipment use and safety with use of the equipment. Flowsheet Row Cardiac Rehab from 07/11/2020 in [facility] Cardiac and Pulmonary Rehab  Date 03/21/20  Educator [facility]  Instruction Review Code 1- Verbalizes Understanding      Education: Exercise Physiology & General Exercise Guidelines: - Group verbal and written instruction with models to review the exercise physiology of the cardiovascular system and associated critical values. Provides general exercise guidelines with specific guidelines to those with heart or lung disease.  Flowsheet Row Cardiac Rehab from 07/11/2020 in [facility] Cardiac and Pulmonary Rehab  Date 03/26/20  Instruction Review Code 3- Needs Reinforcement  [need identified]      Education: Flexibility, Balance, Mind/Body Relaxation: - Group verbal and visual presentation with interactive activity on the components of exercise prescription. Introduces F.I.T.T principle from ACSM for exercise prescriptions. Reviews F.I.T.T. principles of flexibility and balance exercise training including progression. Also discusses the mind body connection.  Reviews various relaxation techniques to help reduce and manage stress (i.e. Deep breathing, progressive muscle relaxation, and visualization). Balance handout provided to take home. Written material given at graduation. Flowsheet Row Cardiac Rehab from 07/11/2020 in [facility] Cardiac and Pulmonary Rehab  Date 06/06/20  Educator [facility]  Instruction Review Code 1- Verbalizes Understanding      Activity Barriers & Risk Stratification:  Activity Barriers & Cardiac Risk Stratification - 05/07/20 1041      Activity Barriers & Cardiac Risk Stratification    Activity Barriers Joint Problems;Deconditioning;Muscular Weakness;Shortness of Breath;Balance Concerns;History of Falls;Other (comment)    Comments pain in legs with walking (claudication? no PAD Dx)    Cardiac Risk Stratification High           6 Minute Walk:  6 Minute Walk    Row Name 05/07/20 1040         6 Minute Walk   Phase Initial     Distance 925 feet     Walk Time 6 minutes     # of Rest Breaks 0     MPH 1.75     METS 1.87     RPE 13     VO2 Peak 6.5     Symptoms Yes (comment)     Comments pain in calves 9/10, legs fatigued     Resting HR 73 bpm     Resting BP 128/60     Resting Oxygen Saturation  96 %     Exercise Oxygen Saturation  during 6 min walk 99 %     Max Ex. HR 99 bpm     Max Ex. BP 132/70     2 Minute Post BP 122/62  Oxygen Initial Assessment:   Oxygen Re-Evaluation:   Oxygen Discharge (Final Oxygen Re-Evaluation):   Initial Exercise Prescription:  Initial Exercise Prescription - 05/07/20 1000      Date of Initial Exercise RX and Referring Provider   Date 05/07/20    Referring Provider [name] MD      Treadmill   MPH 1.7    Grade 0    Minutes 15    METs 2.3      Recumbant Bike   Level 1    RPM 50    Watts 5    Minutes 15    METs 2      NuStep   Level 1    SPM 80    Minutes 15    METs 2      REL-XR   Level 1    Speed 50    Minutes 15    METs 2      Prescription Details   Frequency (times per week) 2    Duration Progress to 30 minutes of continuous aerobic without signs/symptoms of physical distress      Intensity   THRR 40-80% of Max Heartrate 104-135    Ratings of Perceived Exertion 11-13    Perceived Dyspnea 0-4      Progression   Progression Continue to progress workloads to maintain intensity without signs/symptoms of physical distress.      Resistance Training   Training Prescription Yes    Weight 3 lb    Reps 10-15           Perform Capillary Blood Glucose checks as  needed.  Exercise Prescription Changes:  Exercise Prescription Changes    Row Name 05/07/20 1000 05/20/20 1200 06/05/20 1400 06/18/20 1000 06/19/20 1200     Response to Exercise   Blood Pressure (Admit) 128/60 122/68 126/66 -- 124/64   Blood Pressure (Exercise) 132/70 132/64 124/72 -- 142/64   Blood Pressure (Exit) 122/62 128/72 120/70 -- 130/68   Heart Rate (Admit) 73 bpm 76 bpm 71 bpm -- 86 bpm   Heart Rate (Exercise) 99 bpm 110 bpm 105 bpm -- 108 bpm   Heart Rate (Exit) 74 bpm 91 bpm 77 bpm -- 90 bpm   Oxygen Saturation (Admit) 96 % -- -- -- --   Oxygen Saturation (Exercise) 99 % -- -- -- --   Rating of Perceived Exertion (Exercise) 13 12 13  -- 15   Symptoms legs fatigued, calve pain 9/10 -- none -- --   Comments walk test results first day -- -- --   Duration -- Progress to 30 minutes of  aerobic without signs/symptoms of physical distress Continue with 30 min of aerobic exercise without signs/symptoms of physical distress. -- Continue with 30 min of aerobic exercise without signs/symptoms of physical distress.   Intensity -- THRR unchanged THRR unchanged -- THRR unchanged     Progression   Progression -- Continue to progress workloads to maintain intensity without signs/symptoms of physical distress. Continue to progress workloads to maintain intensity without signs/symptoms of physical distress. -- Continue to progress workloads to maintain intensity without signs/symptoms of physical distress.   Average METs -- 2.2 2.06 -- 2.2     Resistance Training   Training Prescription -- Yes Yes -- Yes   Weight -- 3 lb 4 lb -- 5 lb   Reps -- 10-15 10-15 -- 10-15     Interval Training   Interval Training -- -- No -- No     Treadmill   MPH --  2.5 1.7 -- 1.7   Grade -- 0.5 0.5 -- 0.5   Minutes -- 15 15 -- 15   METs -- 3.09 2.42 -- 2.42     REL-XR   Level -- 3 1 -- 3   Speed -- 50 -- -- 50   Minutes -- 15 15 -- 15   METs -- 1.3 1.7 -- --     Home Exercise Plan   Plans to  continue exercise at -- -- -- Home (comment)  walking, treadmill, cross trainer Home (comment)  walking, treadmill, cross trainer   Frequency -- -- -- Add 3 additional days to program exercise sessions. Add 3 additional days to program exercise sessions.   Initial Home Exercises Provided -- -- -- 06/18/20 06/18/20   Row Name 07/03/20 1200 07/16/20 1200 07/29/20 1600         Response to Exercise   Blood Pressure (Admit) 128/64 124/64 124/70     Blood Pressure (Exercise) 150/68 132/70 136/70     Blood Pressure (Exit) 148/74 118/60 132/70     Heart Rate (Admit) 86 bpm 82 bpm 81 bpm     Heart Rate (Exercise) 104 bpm 100 bpm 103 bpm     Heart Rate (Exit) 86 bpm 71 bpm 76 bpm     Rating of Perceived Exertion (Exercise) 12 15 13      Duration Continue with 30 min of aerobic exercise without signs/symptoms of physical distress. Continue with 30 min of aerobic exercise without signs/symptoms of physical distress. Continue with 30 min of aerobic exercise without signs/symptoms of physical distress.     Intensity THRR unchanged THRR unchanged THRR unchanged           Progression   Progression Continue to progress workloads to maintain intensity without signs/symptoms of physical distress. Continue to progress workloads to maintain intensity without signs/symptoms of physical distress. Continue to progress workloads to maintain intensity without signs/symptoms of physical distress.     Average METs 3.6 2.3 2.2           Resistance Training   Training Prescription Yes Yes Yes     Weight 5 lb 5 lb 5 lb     Reps 10-15 10-15 10-15           Interval Training   Interval Training -- No No           Treadmill   MPH 1.7 2 2      Grade 0.5 0.5 0.5     Minutes 15 15 15      METs 2.42 2.67 2.67           REL-XR   Level 3 -- 3     Speed 50 -- 50     Minutes 15 -- 15     METs 4.8 -- 1.3           Home Exercise Plan   Plans to continue exercise at Home (comment)  walking, treadmill, cross trainer  Home (comment)  walking, treadmill, cross trainer Home (comment)  walking, treadmill, cross trainer     Frequency Add 3 additional days to program exercise sessions. Add 3 additional days to program exercise sessions. Add 3 additional days to program exercise sessions.     Initial Home Exercises Provided 06/18/20 06/18/20 06/18/20            Exercise Comments:  Exercise Comments    Row Name 05/14/20 1148           Exercise Comments First full day  of exercise!  Patient was oriented to gym and equipment including functions, settings, policies, and procedures.  Patient's individual exercise prescription and treatment plan were reviewed.  All starting workloads were established based on the results of the 6 minute walk test done at initial orientation visit.  The plan for exercise progression was also introduced and progression will be customized based on patient's performance and goals.              Exercise Goals and Review:   Exercise Goals Re-Evaluation :  Exercise Goals Re-Evaluation    Row Name 05/14/20 1148 05/30/20 1006 06/05/20 1413 06/18/20 1028 06/19/20 1258     Exercise Goal Re-Evaluation   Exercise Goals Review Able to understand and use rate of perceived exertion (RPE) scale;Knowledge and understanding of Target Heart Rate Range (THRR);Understanding of Exercise Prescription Able to understand and use rate of perceived exertion (RPE) scale;Knowledge and understanding of Target Heart Rate Range (THRR);Understanding of Exercise Prescription Increase Physical Activity;Increase Strength and Stamina;Understanding of Exercise Prescription Increase Physical Activity;Increase Strength and Stamina;Understanding of Exercise Prescription Increase Physical Activity;Increase Strength and Stamina;Understanding of Exercise Prescription   Comments Reviewed RPE and dyspnea scales, THR and program prescription with pt today.  Pt voiced understanding and was given a copy of goals to take home.  Jeremy Dorsey does not do any exercise at home as of yet. He recent;y came back to rehab after he had a stent placed and feels much better, he rarely experiences chest pain and feels confident to start exercise at home. He has 3 different machines he can chose from. EP will review home exercise in the next couple of weeks. Jeremy Dorsey is doing well in rehab.  He is now up to 1.7 METs on the recumbent elliptical and up to 4lb hand weights.  He should be ready to start to increase his workloads. Jeremy Dorsey is doing well in rehab.  He has started to add in some exericse at home.  He is not sure how fast he is going on his treadmill so we talked about using his heart rate and RPE to help gauge his intensity. Reviewed home exercise with pt today.  Pt plans to walk and use treadmill at home for exercise.  He also has a cross traininer that he can use at home. Reviewed THR, pulse, RPE, sign and symptoms, pulse oximetery and when to call 911 or MD.  Also discussed weather considerations and indoor options.  Pt voiced understanding. Jeremy Dorsey is doing well in rehab.  He has started to add in some exericse at home.  He is not sure how fast he is going on his treadmill so we talked about using his heart rate and RPE to help gauge his intensity. Reviewed home exercise with pt today.  Pt plans to walk and use treadmill at home for exercise.  He also has a cross traininer that he can use at home. Reviewed THR, pulse, RPE, sign and symptoms, pulse oximetery and when to call 911 or MD.  Also discussed weather considerations and indoor options.  Pt voiced understanding.   Expected Outcomes Short: Use RPE daily to regulate intensity. Long: Follow program prescription in THR. Short: Review home exercise and expectations Long: Able to exercise independently at home with no complications Short: Increase workloads Long: Continue to improve stamina Short: Continue to add in exercise at home and start using heart rate to judge  Long: Continue to improve stamina.  Short: Continue to add in exercise at home and start using heart  rate to judge  Long: Continue to improve stamina.   [place] Name 07/03/20 1252 07/11/20 1610 07/16/20 1247 07/29/20 1659       Exercise Goal Re-Evaluation   Exercise Goals Review -- Increase Physical Activity;Increase Strength and Stamina;Understanding of Exercise Prescription Increase Physical Activity;Increase Strength and Stamina;Understanding of Exercise Prescription Increase Physical Activity;Increase Strength and Stamina    Comments Jeremy Dorsey is progressing well and has increased resistance on XR.  He has moved up to 5 lb for strength work.  Staff will encourage increasing incline on TM and continued progression on seated machines. Jeremy Dorsey is doing well in rehab.  He is feeling better and able to do his yard work again and has been blowing leaves recently. His stamina is better.  He will get back to his treadmill once he is done with leaves. Jeremy Dorsey has increased TM during class to 2 and .5 incline.  He asked staff about a cramp in his calf that starts after 15 min on TM.  I had him stop and stretch and that helped.  He reports no swelling or redness in the area.  He did say his calves have always been very tight. Staff reviewed stretching for the calves. Jeremy Dorsey has only attended 4 times so far this month.  Consistent exercise would be beneficial for progress.  Staff will monitor progress.    Expected Outcomes Short:  continue to attend consistently and check HR on his own Long: increase MET level Short: Continue to exercise daily  Long: Get back to treadmill at home after leaf season Short: try to stretch calves daily Long:  walk without calf tightness Short: increase level on XR Long: improve TM walk and overall stamina           Discharge Exercise Prescription (Final Exercise Prescription Changes):  Exercise Prescription Changes - 07/29/20 1600      Response to Exercise   Blood Pressure (Admit) 124/70    Blood Pressure (Exercise) 136/70     Blood Pressure (Exit) 132/70    Heart Rate (Admit) 81 bpm    Heart Rate (Exercise) 103 bpm    Heart Rate (Exit) 76 bpm    Rating of Perceived Exertion (Exercise) 13    Duration Continue with 30 min of aerobic exercise without signs/symptoms of physical distress.    Intensity THRR unchanged      Progression   Progression Continue to progress workloads to maintain intensity without signs/symptoms of physical distress.    Average METs 2.2      Resistance Training   Training Prescription Yes    Weight 5 lb    Reps 10-15      Interval Training   Interval Training No      Treadmill   MPH 2    Grade 0.5    Minutes 15    METs 2.67      REL-XR   Level 3    Speed 50    Minutes 15    METs 1.3      Home Exercise Plan   Plans to continue exercise at Home (comment)   walking, treadmill, cross trainer   Frequency Add 3 additional days to program exercise sessions.    Initial Home Exercises Provided 06/18/20           Nutrition:  Target Goals: Understanding of nutrition guidelines, daily intake of sodium <1573m, cholesterol <205m calories 30% from fat and 7% or less from saturated fats, daily to have 5 or more servings of fruits and vegetables.  Education: All About Nutrition: -Group instruction provided by verbal, written material, interactive activities, discussions, models, and posters to present general guidelines for heart healthy nutrition including fat, fiber, MyPlate, the role of sodium in heart healthy nutrition, utilization of the nutrition label, and utilization of this knowledge for meal planning. Follow up email sent as well. Written material given at graduation. Flowsheet Row Cardiac Rehab from 07/11/2020 in [facility] Cardiac and Pulmonary Rehab  Date 04/11/20  Educator [facility]  Instruction Review Code 1- Verbalizes Understanding  [need identified]      Biometrics:  Pre Biometrics - 05/07/20 1044      Pre Biometrics   Height 5' 9.1" (1.755 m)    Weight 231 lb 6.4 oz  (105 kg)    BMI (Calculated) 34.08    Single Leg Stand 5.2 seconds            Nutrition Therapy Plan and Nutrition Goals:  Nutrition Therapy & Goals - 06/04/20 0925      Nutrition Therapy   Diet Heart healthy, low Na, CKD stg 4 MNT    Drug/Food Interactions Statins/Certain Fruits    Protein (specify units) 85g    Fiber 30 grams    Whole Grain Foods 3 servings    Saturated Fats 12 max. grams    Fruits and Vegetables 8 servings/day    Sodium 1.5 grams      Personal Nutrition Goals   Nutrition Goal ST: try canned salmon and beans (bean salad) to switch up protein. Add walnuts to breakfast or boild egg ~2x/week to add protein.  LT: get a variety of vegetables with moderate potassium intake, get a variety of protein foods    Comments No salt since July, not bread at everymeal, no red meat for 4 months. He is lactose intolerant. B: Bowl of life cereal with almond milk with water L: chicken sandwich or [place] - whole wheat bread - sometimes no bread S: unsweet tea D: salads with sliced [place], soups (homemade chicken noodle - reduced salt, vegetable beef soup, clear broth clam "chowder"), pork chops 1x/month, wife doesn't like fish - will get when out to eat, chicken and [place] most of the time. Will have brown rice when he has grains. Vegetables: he loves green beans, corn, brussels sprouts, he doesn't like beets, okra, or asparagus. Uses extra virgin olive oil  or pam spray. He doesn't like canned tuna that he doesn't like. No medication for diabetes, BG is well controlled. 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 Discussed heart healthy and T2DM eating. He would like to get a wider variety of protein.      Intervention Plan   Intervention Prescribe, educate and counsel regarding individualized specific dietary modifications aiming towards targeted core components such as weight, hypertension, lipid management, diabetes, heart failure and other comorbidities.;Nutrition handout(s) given to  patient.    Expected Outcomes Short Term Goal: Understand basic principles of dietary content, such as calories, fat, sodium, cholesterol and nutrients.;Short Term Goal: A plan has been developed with personal nutrition goals set during dietitian appointment.;Long Term Goal: Adherence to prescribed nutrition plan.           Nutrition Assessments:  MEDIFICTS Score Key:  ?70 Need to make dietary changes   40-70 Heart Healthy Diet  ? 40 Therapeutic Level Cholesterol Diet   Picture Your Plate Scores:  <84 Unhealthy dietary pattern with much room for improvement.  41-50 Dietary pattern unlikely to meet recommendations for good health and room for improvement.  51-60 More healthful dietary pattern, with some room for improvement.   >60 Healthy dietary pattern, although there may be some specific behaviors that could be improved.    Nutrition Goals Re-Evaluation:  Nutrition Goals Re-Evaluation    [place] Name 06/18/20 1006 07/11/20 0958           Goals   Nutrition Goal ST: try canned salmon and beans (bean salad) to switch up protein. Add walnuts to breakfast or boild egg ~2x/week to add protein.  LT: get a variety of vegetables with moderate potassium intake, get a variety of protein foods Short: Continue to to add vareity  Long: Continue to focus on healthier eating.      Comment Jeremy Dorsey is doing well with his diet.  He has not tried the bean salad and prefers fresh fish versus canned.  He did admit to hamburger in his spaghetti.  He prefers chicken to read meat.  He eats a lot of salds and tries for two vegetables at each meal. Jeremy Dorsey is doing well with his diet.  He feels that he is still retaining fluid but in now really watching his sodium intake.  He is washing his canned vegetables and no longer adding salt at the table.  He is eating a wide variety of food especially vegetables.      Expected Outcome Short: Continue to to add vareity  Long: Continue to focus on healthier eating.  Short: Continue to watch sodium Long: Continue to eat healthy.             Nutrition Goals Discharge (Final Nutrition Goals Re-Evaluation):  Nutrition Goals Re-Evaluation - 07/11/20 0958      Goals   Nutrition Goal Short: Continue to to add vareity  Long: Continue to focus on healthier eating.    Comment Jeremy Dorsey is doing well with his diet.  He feels that he is still retaining fluid but in now really watching his sodium intake.  He is washing his canned vegetables and no longer adding salt at the table.  He is eating a wide variety of food especially vegetables.    Expected Outcome Short: Continue to watch sodium Long: Continue to eat healthy.           Psychosocial: Target Goals: Acknowledge presence or absence of significant depression and/or stress, maximize coping skills, provide positive support system. Participant is able to verbalize types and ability to use techniques and skills needed for reducing stress and depression.   Education: Stress, Anxiety, and Depression - Group verbal and visual presentation to define topics covered.  Reviews how body is impacted by stress, anxiety, and depression.  Also discusses healthy ways to reduce stress and to treat/manage anxiety and depression.  Written material given at graduation. Flowsheet Row Cardiac Rehab from 07/11/2020 in [facility] Cardiac and Pulmonary Rehab  Date 07/11/20  Educator SB  Instruction Review Code 1- [employer] Understanding      Education: Sleep Hygiene -Provides group verbal and written instruction about how sleep can affect your health.  Define sleep hygiene, discuss sleep cycles and impact of sleep habits. Review good sleep hygiene tips.    Initial Review & Psychosocial Screening:   Quality of Life Scores:   Scores of 19 and below usually indicate a poorer quality of life in these areas.  A difference of  2-3 points is a clinically meaningful difference.  A difference of 2-3 points in the total score of the Quality  of Life Index has been associated with significant improvement in  overall quality of life, self-image, physical symptoms, and general health in studies assessing change in quality of life.  PHQ-9: Recent Review Flowsheet Data    Depression screen [facility] 2/9 05/07/2020 03/26/2020 05/24/2019   Decreased Interest 0 1 0   Down, Depressed, Hopeless 0 1 0   PHQ - 2 Score 0 2 0   Altered sleeping 1 3 -   Tired, decreased energy 1 2 -   Change in appetite 1 3 -   Feeling bad or failure about yourself  0 0 -   Trouble concentrating 0 0 -   Moving slowly or fidgety/restless 0 0 -   Suicidal thoughts 0 0 -   PHQ-9 Score 3 10 -   Difficult doing work/chores Not difficult at all Not difficult at all -     Interpretation of Total Score  Total Score Depression Severity:  1-4 = Minimal depression, 5-9 = Mild depression, 10-14 = Moderate depression, 15-19 = Moderately severe depression, 20-27 = Severe depression   Psychosocial Evaluation and Intervention:   Psychosocial Re-Evaluation:  Psychosocial Re-Evaluation    Row Name 05/30/20 1016 06/18/20 1001 07/11/20 0955         Psychosocial Re-Evaluation   Current issues with Current Sleep Concerns Current Sleep Concerns;Current Stress Concerns Current Sleep Concerns;Current Stress Concerns     Comments Jeremy Dorsey is doing well mentally. He stated he has not slept well since a while ago since he is up a lot to go to the bathroom in the middle of the night. He has already talked to his doctor about it and has declined taking any sleep-aid or medications to help with sleep. Denied big stressors or anxiety related events. He has great support from his wife. He is feeling much better since he has his stent placed as he rarely feels chest pain. Therefore, he feels he can go out and do more which he is happy about. Jeremy Dorsey is doing well in rehab.  He is doing well mentally.  He takes it one day at a time and deals with things as they come.  He tries not to worry about  anything and just does what he can.  He would love uninterrupted sleep, but his fluid keeps him getting up at night to go to bathroom.  He is able to get back to sleep afterwards pretty easily.  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  But he took control and asked for them to refrain from bad behavior and language. Jeremy Dorsey is doing well mentally.  He continues to stay positive.  His bathroom trips have continued to keep him up at night, but they are getting better.  He is up to 3-3.5 hours before getting up. He is going to vien specialist tomorrow to get worked up for fistula.  He is not excited about all this.     Expected Outcomes Short: Continue attending rehab consistently Long: Utilize exercise for stress management and maintain positive attitude Short: Continue to sleep as best as possible Long; Continue to stay positive. Short: Get through appointment tomorrow.  Long: Continue to stay positive     Interventions Encouraged to attend Cardiac Rehabilitation for the exercise Encouraged to attend Cardiac Rehabilitation for the exercise Encouraged to attend Cardiac Rehabilitation for the exercise     Continue Psychosocial Services  Follow up required by staff Follow up required by staff Follow  up required by staff            Psychosocial Discharge (Final Psychosocial Re-Evaluation):  Psychosocial Re-Evaluation - 07/11/20 0955      Psychosocial Re-Evaluation   Current issues with Current Sleep Concerns;Current Stress Concerns    Comments Jeremy Dorsey is doing well mentally.  He continues to stay positive.  His bathroom trips have continued to keep him up at night, but they are getting better.  He is up to 3-3.5 hours before getting up. He is going to vien specialist tomorrow to get worked up for fistula.  He is not excited about all this.    Expected Outcomes Short: Get through appointment tomorrow.  Long: Continue to  stay positive    Interventions Encouraged to attend Cardiac Rehabilitation for the exercise    Continue Psychosocial Services  Follow up required by staff           Vocational Rehabilitation: Provide vocational rehab assistance to qualifying candidates.   Vocational Rehab Evaluation & Intervention:   Education: Education Goals: Education classes will be provided on a variety of topics geared toward better understanding of heart health and risk factor modification. Participant will state understanding/return demonstration of topics presented as noted by education test scores.  Learning Barriers/Preferences:   General Cardiac Education Topics:  AED/CPR: - Group verbal and written instruction with the use of models to demonstrate the basic use of the AED with the basic ABC's of resuscitation.   Anatomy and Cardiac Procedures: - Group verbal and visual presentation and models provide information about basic cardiac anatomy and function. Reviews the testing methods done to diagnose heart disease and the outcomes of the test results. Describes the treatment choices: Medical Management, Angioplasty, or Coronary Bypass Surgery for treating various heart conditions including Myocardial Infarction, Angina, Valve Disease, and Cardiac Arrhythmias.  Written material given at graduation. Flowsheet Row Cardiac Rehab from 07/11/2020 in [facility] Cardiac and Pulmonary Rehab  Date 03/26/20  Instruction Review Code 3- Needs Reinforcement  [need identified]      Medication Safety: - Group verbal and visual instruction to review commonly prescribed medications for heart and lung disease. Reviews the medication, class of the drug, and side effects. Includes the steps to properly store meds and maintain the prescription regimen.  Written material given at graduation.   Intimacy: - Group verbal instruction through game format to discuss how heart and lung disease can affect sexual intimacy. Written material  given at graduation.. Flowsheet Row Cardiac Rehab from 07/11/2020 in [facility] Cardiac and Pulmonary Rehab  Date 05/23/20  Educator [facility]  Instruction Review Code 1- Verbalizes Understanding      Know Your Numbers and Heart Failure: - Group verbal and visual instruction to discuss disease risk factors for cardiac and pulmonary disease and treatment options.  Reviews associated critical values for Overweight/Obesity, Hypertension, Cholesterol, and Diabetes.  Discusses basics of heart failure: signs/symptoms and treatments.  Introduces Heart Failure Zone chart for action plan for heart failure.  Written material given at graduation. Flowsheet Row Cardiac Rehab from 07/11/2020 in [facility] Cardiac and Pulmonary Rehab  Date 06/25/20  Educator [facility]  Instruction Review Code 1- Verbalizes Understanding      Infection Prevention: - Provides verbal and written material to individual with discussion of infection control including proper hand washing and proper equipment cleaning during exercise session. Flowsheet Row Cardiac Rehab from 07/11/2020 in [facility] Cardiac and Pulmonary Rehab  Date 03/21/20  Educator [facility]  Instruction Review Code 1- Verbalizes Understanding  Falls Prevention: - Provides verbal and written material to individual with discussion of falls prevention and safety. Flowsheet Row Cardiac Rehab from 07/11/2020 in [facility] Cardiac and Pulmonary Rehab  Date 03/21/20  Educator [facility]  Instruction Review Code 1- Verbalizes Understanding      Other: -Provides group and verbal instruction on various topics (see comments)   Knowledge Questionnaire Score:   Core Components/Risk Factors/Patient Goals at Admission:   Education:Diabetes - Individual verbal and written instruction to review signs/symptoms of diabetes, desired ranges of glucose level fasting, after meals and with exercise. Acknowledge that pre and post exercise glucose checks will be done for 3 sessions at entry of program. [place] from 07/11/2020 in [facility] Cardiac and Pulmonary Rehab  Date 03/21/20  Educator [facility]  Instruction Review Code 1- Verbalizes Understanding      Core Components/Risk Factors/Patient Goals Review:   Goals and Risk Factor Review    Row Name 05/30/20 1010 06/18/20 1003 07/11/20 1002         Core Components/Risk Factors/Patient Goals Review   Personal Goals Review Weight Management/Obesity;Hypertension;Lipids;Diabetes Weight Management/Obesity;Hypertension;Diabetes Weight Management/Obesity;Hypertension;Diabetes     Review Jeremy Dorsey takes his blood sugars at home once per day which have been stable. He also takes BP at home,  and reports 130  or lower for systolic below 80 for diastolic. BPs at rehab have also been in appropriate range.  He weighs himself every morning- stable with weight but wants to lose weight as one of his main goals. He same given up white bread and has consumed less simple carbs. He has a meeting with the RD soon to talk more about weight loss and establish nutrition goals.H feels his pants are big and shirt feels looser. Jeremy Dorsey is doing well in rehab.  His weight is staying steady. He would like to lose more, reviewed exercise today so hopefully adding some in will be helpful.  His left calf pain has resolved but his right calf still has a slight knot.  He is feeling a lot better.  His blood pressures have been doing well and staying below 150s.  He has also gotten away from using salt.  His sugars are doing well and he reports they are good. His A1c is down to 7.3!!  He is pleased with his progress.  He is doing well with his medications overall.The [place] is doing better now at the lower dose. Jeremy Dorsey is doing well in rehab. His weight continues to fluctuate up and down.  At home he is around 227 lb and continues to trend down overall. His blood pressures have been good in class and at home.  His sugars continue to do well too.     Expected Outcomes Short: Meet with RD to  discuss weight loss goals Long: Continue to manage lifestyle risk factors Short: Continue to work on weight loss long; Continue to monitor risk factors. Short: Continue to work on weight loss long; Continue to monitor risk factors.            Core Components/Risk Factors/Patient Goals at Discharge (Final Review):   Goals and Risk Factor Review - 07/11/20 1002      Core Components/Risk Factors/Patient Goals Review   Personal Goals Review Weight Management/Obesity;Hypertension;Diabetes    Review Jeremy Dorsey is doing well in rehab. His weight continues to fluctuate up and down.  At home he is around 227 lb and continues to trend down overall. His blood pressures have been good in class and at home.  His sugars continue to do well too.    Expected Outcomes Short: Continue to work on weight loss long; Continue to monitor risk factors.           ITP Comments:  ITP Comments    Row Name 05/07/20 1040 05/08/20 0608 05/14/20 1148 05/14/20 1150 06/05/20 0608   ITP Comments Completed 6MWT and gym orientation. Initial ITP created and sent for review to Dr. Emily Filbert, Medical Director.  Restarting with new stents and MD. 30 Day review completed. Medical Director ITP review done, changes made as directed, and signed approval by Medical Director. First full day of exercise!  Patient was oriented to gym and equipment including functions, settings, policies, and procedures.  Patient's individual exercise prescription and treatment plan were reviewed.  All starting workloads were established based on the results of the 6 minute walk test done at initial orientation visit.  The plan for exercise progression was also introduced and progression will be customized based on patient's performance and goals. Jeremy Dorsey is returning after new stent placed for persistent angina. 30 Day review completed. Medical Director ITP review done, changes made as directed, and signed approval by Medical Director.   Row Name 07/03/20 0907  07/31/20 0521         ITP Comments 30 Day review completed. Medical Director ITP review done, changes made as directed, and signed approval by Medical Director. 30 Day review completed. Medical Director ITP review done, changes made as directed, and signed approval by Medical Director.             Comments:

## 2020-08-01 ENCOUNTER — Encounter: Payer: PPO | Admitting: *Deleted

## 2020-08-01 ENCOUNTER — Other Ambulatory Visit: Payer: Self-pay

## 2020-08-01 DIAGNOSIS — Z955 Presence of coronary angioplasty implant and graft: Secondary | ICD-10-CM

## 2020-08-01 NOTE — Progress Notes (Signed)
Daily Session Note  Patient Details  Name: Jeremy Dorsey MRN: [id number] Date of Birth: Jan 24, 1951 Referring Provider:   Flowsheet Row Cardiac Rehab from 05/07/2020 in [facility] Cardiac and Pulmonary Rehab  Referring Provider [name] MD      Encounter Date: 08/01/2020  Check In:  Session Check In - 08/01/20 1031      Check-In   Supervising physician immediately available to respond to emergencies See telemetry face sheet for immediately available ER MD    Location [facility]-Cardiac & Pulmonary Rehab    Staff Present Hope Budds RDN, Tawanna Solo, MS Exercise Physiologist;Mister Krahenbuhl, RN, BSN, CCRP    Virtual Visit No    Medication changes reported     No    Fall or balance concerns reported    No    Warm-up and Cool-down Performed on first and last piece of equipment    Resistance Training Performed Yes    VAD Patient? No    PAD/SET Patient? No      Pain Assessment   Currently in Pain? No/denies              Social History   Tobacco Use  Smoking Status Former Smoker  . Packs/day: 1.00  . Years: 30.00  . Pack years: 30.00  . Types: Cigarettes  . Quit date: 08/23/2007  . Years since quitting: 12.9  Smokeless Tobacco Never Used    Goals Met:  Independence with exercise equipment Exercise tolerated well No report of cardiac concerns or symptoms  Goals Unmet:  Not Applicable  Comments: Pt able to follow exercise prescription today without complaint.  Will continue to monitor for progression.    Dr. Emily Filbert is Medical Director for [place] and [name].

## 2020-08-06 ENCOUNTER — Encounter: Payer: PPO | Attending: Cardiovascular Disease | Admitting: *Deleted

## 2020-08-06 ENCOUNTER — Other Ambulatory Visit: Payer: Self-pay

## 2020-08-06 DIAGNOSIS — Z87891 Personal history of nicotine dependence: Secondary | ICD-10-CM | POA: Diagnosis not present

## 2020-08-06 DIAGNOSIS — Z955 Presence of coronary angioplasty implant and graft: Secondary | ICD-10-CM | POA: Diagnosis not present

## 2020-08-06 DIAGNOSIS — I214 Non-ST elevation (NSTEMI) myocardial infarction: Secondary | ICD-10-CM | POA: Diagnosis not present

## 2020-08-06 NOTE — Progress Notes (Signed)
Daily Session Note  Patient Details  Name: Jeremy Dorsey MRN: [id number] Date of Birth: 10/22/1950 Referring Provider:   Flowsheet Row Cardiac Rehab from 05/07/2020 in [facility] Cardiac and Pulmonary Rehab  Referring Provider [name] MD      Encounter Date: 08/06/2020  Check In:  Session Check In - 08/06/20 1035      Check-In   Supervising physician immediately available to respond to emergencies See telemetry face sheet for immediately available ER MD    Location [facility]-Cardiac & Pulmonary Rehab    Staff Present Heath Lark, RN, BSN, Jacklynn Bue, MS Exercise Physiologist;Amanda Oletta Darter, [place], ACSM CEP, Exercise Physiologist    Virtual Visit No    Medication changes reported     No    Fall or balance concerns reported    No    Warm-up and Cool-down Performed on first and last piece of equipment    Resistance Training Performed Yes    VAD Patient? No    PAD/SET Patient? No      Pain Assessment   Currently in Pain? No/denies              Social History   Tobacco Use  Smoking Status Former Smoker  . Packs/day: 1.00  . Years: 30.00  . Pack years: 30.00  . Types: Cigarettes  . Quit date: 08/23/2007  . Years since quitting: 12.9  Smokeless Tobacco Never Used    Goals Met:  Independence with exercise equipment Exercise tolerated well No report of cardiac concerns or symptoms  Goals Unmet:  Not Applicable  Comments: Pt able to follow exercise prescription today without complaint.  Will continue to monitor for progression.    Dr. Emily Filbert is Medical Director for [place] and [name].

## 2020-08-08 ENCOUNTER — Other Ambulatory Visit: Payer: Self-pay

## 2020-08-08 DIAGNOSIS — Z955 Presence of coronary angioplasty implant and graft: Secondary | ICD-10-CM

## 2020-08-08 DIAGNOSIS — I214 Non-ST elevation (NSTEMI) myocardial infarction: Secondary | ICD-10-CM

## 2020-08-08 NOTE — Progress Notes (Signed)
Daily Session Note  Patient Details  Name: Jeremy Dorsey MRN: [id number] Date of Birth: 1950/11/14 Referring Provider:   Flowsheet Row Cardiac Rehab from 05/07/2020 in [facility] Cardiac and Pulmonary Rehab  Referring Provider [name] MD      Encounter Date: 08/08/2020  Check In:  Session Check In - 08/08/20 0955      Check-In   Supervising physician immediately available to respond to emergencies See telemetry face sheet for immediately available ER MD    Location [facility]-Cardiac & Pulmonary Rehab    Staff Present Birdie Sons, MPA, RN;Melissa Caiola RDN, LDN;Jessica Luan Pulling, MA, RCEP, CCRP, CCET    Virtual Visit No    Medication changes reported     No    Fall or balance concerns reported    No    Tobacco Cessation No Change    Warm-up and Cool-down Performed on first and last piece of equipment    Resistance Training Performed Yes    VAD Patient? No    PAD/SET Patient? No      Pain Assessment   Currently in Pain? No/denies              Social History   Tobacco Use  Smoking Status Former Smoker  . Packs/day: 1.00  . Years: 30.00  . Pack years: 30.00  . Types: Cigarettes  . Quit date: 08/23/2007  . Years since quitting: 12.9  Smokeless Tobacco Never Used    Goals Met:  Independence with exercise equipment Exercise tolerated well Personal goals reviewed No report of cardiac concerns or symptoms Strength training completed today  Goals Unmet:  Not Applicable  Comments: Pt able to follow exercise prescription today without complaint.  Will continue to monitor for progression.    Dr. Emily Filbert is Medical Director for [place] and [name].

## 2020-08-28 ENCOUNTER — Encounter: Payer: Self-pay | Admitting: *Deleted

## 2020-08-28 DIAGNOSIS — N2581 Secondary hyperparathyroidism of renal origin: Secondary | ICD-10-CM | POA: Diagnosis not present

## 2020-08-28 DIAGNOSIS — N184 Chronic kidney disease, stage 4 (severe): Secondary | ICD-10-CM | POA: Diagnosis not present

## 2020-08-28 DIAGNOSIS — Z955 Presence of coronary angioplasty implant and graft: Secondary | ICD-10-CM

## 2020-08-28 DIAGNOSIS — E1122 Type 2 diabetes mellitus with diabetic chronic kidney disease: Secondary | ICD-10-CM | POA: Diagnosis not present

## 2020-08-28 DIAGNOSIS — I2510 Atherosclerotic heart disease of native coronary artery without angina pectoris: Secondary | ICD-10-CM | POA: Diagnosis not present

## 2020-08-28 DIAGNOSIS — E875 Hyperkalemia: Secondary | ICD-10-CM | POA: Diagnosis not present

## 2020-08-28 DIAGNOSIS — Z6835 Body mass index (BMI) 35.0-35.9, adult: Secondary | ICD-10-CM | POA: Diagnosis not present

## 2020-08-28 DIAGNOSIS — D7218 Eosinophilia in diseases classified elsewhere: Secondary | ICD-10-CM | POA: Diagnosis not present

## 2020-08-28 DIAGNOSIS — I129 Hypertensive chronic kidney disease with stage 1 through stage 4 chronic kidney disease, or unspecified chronic kidney disease: Secondary | ICD-10-CM | POA: Diagnosis not present

## 2020-08-28 DIAGNOSIS — N3080 Other cystitis without hematuria: Secondary | ICD-10-CM | POA: Diagnosis not present

## 2020-08-28 DIAGNOSIS — R809 Proteinuria, unspecified: Secondary | ICD-10-CM | POA: Diagnosis not present

## 2020-08-28 NOTE — Progress Notes (Signed)
Cardiac Individual Treatment Plan  Patient Details  Name: TRESTAN VAHLE MRN: [id number] Date of Birth: 1951/07/12 Referring Provider:   Flowsheet Row Cardiac Rehab from 05/07/2020 in [facility] Cardiac and Pulmonary Rehab  Referring Provider [name] MD      Initial Encounter Date:  Flowsheet Row Cardiac Rehab from 05/07/2020 in [facility] Cardiac and Pulmonary Rehab  Date 05/07/20      Visit Diagnosis: Status post coronary artery stent placement  Patient's Home Medications on Admission:  Current Outpatient Medications:  .  aspirin 81 MG EC tablet, Take 1 tablet (81 mg total) by mouth daily. Swallow whole., Disp: 30 tablet, Rfl: 11 .  atorvastatin (LIPITOR) 80 MG tablet, Take 1 tablet (80 mg total) by mouth daily., Disp: 90 tablet, Rfl: 1 .  calcitRIOL (ROCALTROL) 0.25 MCG capsule, Take 0.5 mcg by mouth daily. (Prescription instructions say to take 2 capsules by mouth daily), Disp: , Rfl:  .  clopidogrel (PLAVIX) 75 MG tablet, Take 1 tablet (75 mg total) by mouth daily with breakfast., Disp: 90 tablet, Rfl: 1 .  furosemide (LASIX) 40 MG tablet, Take 40 mg by mouth 2 (two) times daily., Disp: , Rfl:  .  metoprolol tartrate (LOPRESSOR) 25 MG tablet, Take 1 tablet (25 mg total) by mouth 2 (two) times daily., Disp: 180 tablet, Rfl: 1 .  nitroGLYCERIN (NITROSTAT) 0.4 MG SL tablet, Place 1 tablet (0.4 mg total) under the tongue every 5 (five) minutes x 3 doses as needed for chest pain., Disp: 25 tablet, Rfl: 12 .  sitaGLIPtin (JANUVIA) 25 MG tablet, Take 1 tablet (25 mg total) by mouth daily., Disp: 30 tablet, Rfl: 3 .  sodium bicarbonate 650 MG tablet, Take 650 mg by mouth 2 (two) times daily. , Disp: , Rfl:  .  sodium zirconium cyclosilicate (LOKELMA) 10 g PACK packet, Take 10 g by mouth 3 (three) times a week., Disp: , Rfl:   Past Medical History: Past Medical History:  Diagnosis Date  . Anemia   . CKD (chronic kidney disease), stage IV ([place])   . Coronary artery disease    a. s/p  IVUS-guided DESx2 to prox & mid LAD, residual disease treated medically. EF 50-55% by recent echo 02/2020.  . 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 Hyperlipidemia LDL goal <70   . Hypertension 2008  . Lower extremity edema   . Shoulder pain    Right  . Urinary complication     Tobacco Use: Social History   Tobacco Use  Smoking Status Former Smoker  . Packs/day: 1.00  . Years: 30.00  . Pack years: 30.00  . Types: Cigarettes  . Quit date: 08/23/2007  . Years since quitting: 13.0  Smokeless Tobacco Never Used    Labs: Recent Review Flowsheet Data    Labs for ITP Cardiac and Pulmonary Rehab Latest Ref Rng & Units 02/22/2017 06/10/2018 03/01/2020 03/02/2020 05/22/2020   Cholestrol 0 - 200 mg/dL - - 158 137 -   LDLCALC 0 - 99 mg/dL - - 92 88 -   HDL >40 mg/dL - - 31(L) 28(L) -   Trlycerides <150 mg/dL - - 173(H) 106 -   Hemoglobin A1c 4.0 - 5.6 % 9.1 8.0(A) 7.3(H) - 7.3(A)       Exercise Target Goals: Exercise Program Goal: Individual exercise prescription set using results from initial 6 min walk test and THRR while considering  patient's activity barriers and safety.   Exercise Prescription Goal: Initial exercise prescription builds to 30-45 minutes a day of  aerobic activity, 2-3 days per week.  Home exercise guidelines will be given to patient during program as part of exercise prescription that the participant will acknowledge.   Education: Aerobic Exercise: - Group verbal and visual presentation on the components of exercise prescription. Introduces F.I.T.T principle from ACSM for exercise prescriptions.  Reviews F.I.T.T. principles of aerobic exercise including progression. Written material given at graduation. Flowsheet Row Cardiac Rehab from 08/08/2020 in [facility] Cardiac and Pulmonary Rehab  Date 05/23/20  Educator [facility]  Instruction Review Code 1- Verbalizes Understanding      Education: Resistance Exercise: - Group verbal and visual presentation on the  components of exercise prescription. Introduces F.I.T.T principle from ACSM for exercise prescriptions  Reviews F.I.T.T. principles of resistance exercise including progression. Written material given at graduation.    Education: Exercise & Equipment Safety: - Individual verbal instruction and demonstration of equipment use and safety with use of the equipment. Flowsheet Row Cardiac Rehab from 08/08/2020 in [facility] Cardiac and Pulmonary Rehab  Date 03/21/20  Educator [facility]  Instruction Review Code 1- Verbalizes Understanding      Education: Exercise Physiology & General Exercise Guidelines: - Group verbal and written instruction with models to review the exercise physiology of the cardiovascular system and associated critical values. Provides general exercise guidelines with specific guidelines to those with heart or lung disease.  Flowsheet Row Cardiac Rehab from 08/08/2020 in [facility] Cardiac and Pulmonary Rehab  Date 03/26/20  Instruction Review Code 3- Needs Reinforcement  [need identified]      Education: Flexibility, Balance, Mind/Body Relaxation: - Group verbal and visual presentation with interactive activity on the components of exercise prescription. Introduces F.I.T.T principle from ACSM for exercise prescriptions. Reviews F.I.T.T. principles of flexibility and balance exercise training including progression. Also discusses the mind body connection.  Reviews various relaxation techniques to help reduce and manage stress (i.e. Deep breathing, progressive muscle relaxation, and visualization). Balance handout provided to take home. Written material given at graduation. Flowsheet Row Cardiac Rehab from 08/08/2020 in [facility] Cardiac and Pulmonary Rehab  Date 08/08/20  Rosanna Randy to repeat]  Educator AS  Instruction Review Code 1- Verbalizes Understanding      Activity Barriers & Risk Stratification:  Activity Barriers & Cardiac Risk Stratification - 05/07/20 1041      Activity Barriers & Cardiac  Risk Stratification   Activity Barriers Joint Problems;Deconditioning;Muscular Weakness;Shortness of Breath;Balance Concerns;History of Falls;Other (comment)    Comments pain in legs with walking (claudication? no PAD Dx)    Cardiac Risk Stratification High           6 Minute Walk:  6 Minute Walk    Row Name 05/07/20 1040         6 Minute Walk   Phase Initial     Distance 925 feet     Walk Time 6 minutes     # of Rest Breaks 0     MPH 1.75     METS 1.87     RPE 13     VO2 Peak 6.5     Symptoms Yes (comment)     Comments pain in calves 9/10, legs fatigued     Resting HR 73 bpm     Resting BP 128/60     Resting Oxygen Saturation  96 %     Exercise Oxygen Saturation  during 6 min walk 99 %     Max Ex. HR 99 bpm     Max Ex. BP 132/70     2 Minute Post BP  122/62            Oxygen Initial Assessment:   Oxygen Re-Evaluation:   Oxygen Discharge (Final Oxygen Re-Evaluation):   Initial Exercise Prescription:  Initial Exercise Prescription - 05/07/20 1000      Date of Initial Exercise RX and Referring Provider   Date 05/07/20    Referring Provider [name] MD      Treadmill   MPH 1.7    Grade 0    Minutes 15    METs 2.3      Recumbant Bike   Level 1    RPM 50    Watts 5    Minutes 15    METs 2      NuStep   Level 1    SPM 80    Minutes 15    METs 2      REL-XR   Level 1    Speed 50    Minutes 15    METs 2      Prescription Details   Frequency (times per week) 2    Duration Progress to 30 minutes of continuous aerobic without signs/symptoms of physical distress      Intensity   THRR 40-80% of Max Heartrate 104-135    Ratings of Perceived Exertion 11-13    Perceived Dyspnea 0-4      Progression   Progression Continue to progress workloads to maintain intensity without signs/symptoms of physical distress.      Resistance Training   Training Prescription Yes    Weight 3 lb    Reps 10-15           Perform Capillary Blood  Glucose checks as needed.  Exercise Prescription Changes:  Exercise Prescription Changes    Row Name 05/07/20 1000 05/20/20 1200 06/05/20 1400 06/18/20 1000 06/19/20 1200     Response to Exercise   Blood Pressure (Admit) 128/60 122/68 126/66 - 124/64   Blood Pressure (Exercise) 132/70 132/64 124/72 - 142/64   Blood Pressure (Exit) 122/62 128/72 120/70 - 130/68   Heart Rate (Admit) 73 bpm 76 bpm 71 bpm - 86 bpm   Heart Rate (Exercise) 99 bpm 110 bpm 105 bpm - 108 bpm   Heart Rate (Exit) 74 bpm 91 bpm 77 bpm - 90 bpm   Oxygen Saturation (Admit) 96 % - - - -   Oxygen Saturation (Exercise) 99 % - - - -   Rating of Perceived Exertion (Exercise) 13 12 13  - 15   Symptoms legs fatigued, calve pain 9/10 - none - -   Comments walk test results first day - - -   Duration - Progress to 30 minutes of  aerobic without signs/symptoms of physical distress Continue with 30 min of aerobic exercise without signs/symptoms of physical distress. - Continue with 30 min of aerobic exercise without signs/symptoms of physical distress.   Intensity - THRR unchanged THRR unchanged - THRR unchanged     Progression   Progression - Continue to progress workloads to maintain intensity without signs/symptoms of physical distress. Continue to progress workloads to maintain intensity without signs/symptoms of physical distress. - Continue to progress workloads to maintain intensity without signs/symptoms of physical distress.   Average METs - 2.2 2.06 - 2.2     Resistance Training   Training Prescription - Yes Yes - Yes   Weight - 3 lb 4 lb - 5 lb   Reps - 10-15 10-15 - 10-15     Interval Training   Interval Training - -  No - No     Treadmill   MPH - 2.5 1.7 - 1.7   Grade - 0.5 0.5 - 0.5   Minutes - 15 15 - 15   METs - 3.09 2.42 - 2.42     REL-XR   Level - 3 1 - 3   Speed - 50 - - 50   Minutes - 15 15 - 15   METs - 1.3 1.7 - -     Home Exercise Plan   Plans to continue exercise at - - - Home (comment)   walking, treadmill, cross trainer Home (comment)  walking, treadmill, cross trainer   Frequency - - - Add 3 additional days to program exercise sessions. Add 3 additional days to program exercise sessions.   Initial Home Exercises Provided - - - 06/18/20 06/18/20   Row Name 07/03/20 1200 07/16/20 1200 07/29/20 1600 08/12/20 1000       Response to Exercise   Blood Pressure (Admit) 128/64 124/64 124/70 144/70    Blood Pressure (Exercise) 150/68 132/70 136/70 134/70    Blood Pressure (Exit) 148/74 118/60 132/70 112/70    Heart Rate (Admit) 86 bpm 82 bpm 81 bpm 78 bpm    Heart Rate (Exercise) 104 bpm 100 bpm 103 bpm 101 bpm    Heart Rate (Exit) 86 bpm 71 bpm 76 bpm 79 bpm    Rating of Perceived Exertion (Exercise) 12 15 13 12     Duration Continue with 30 min of aerobic exercise without signs/symptoms of physical distress. Continue with 30 min of aerobic exercise without signs/symptoms of physical distress. Continue with 30 min of aerobic exercise without signs/symptoms of physical distress. Continue with 30 min of aerobic exercise without signs/symptoms of physical distress.    Intensity THRR unchanged THRR unchanged THRR unchanged THRR unchanged         Progression   Progression Continue to progress workloads to maintain intensity without signs/symptoms of physical distress. Continue to progress workloads to maintain intensity without signs/symptoms of physical distress. Continue to progress workloads to maintain intensity without signs/symptoms of physical distress. Continue to progress workloads to maintain intensity without signs/symptoms of physical distress.    Average METs 3.6 2.3 2.2 2.2         Resistance Training   Training Prescription Yes Yes Yes Yes    Weight 5 lb 5 lb 5 lb 5 lb    Reps 10-15 10-15 10-15 10-15         Interval Training   Interval Training - No No No         Treadmill   MPH 1.7 2 2  -    Grade 0.5 0.5 0.5 -    Minutes 15 15 15  -    METs 2.42 2.67 2.67 -          NuStep   Level - - - 4    SPM - - - 80    Minutes - - - 15    METs - - - 2.3         REL-XR   Level 3 - 3 -    Speed 50 - 50 -    Minutes 15 - 15 -    METs 4.8 - 1.3 -         Biostep-RELP   Level - - - 4    SPM - - - 80    Minutes - - - 15    METs - - - 2  Home Exercise Plan   Plans to continue exercise at Home (comment)  walking, treadmill, cross trainer Home (comment)  walking, treadmill, cross trainer Home (comment)  walking, treadmill, cross trainer -    Frequency Add 3 additional days to program exercise sessions. Add 3 additional days to program exercise sessions. Add 3 additional days to program exercise sessions. -    Initial Home Exercises Provided 06/18/20 06/18/20 06/18/20 -           Exercise Comments:  Exercise Comments    Row Name 05/14/20 1148           Exercise Comments First full day of exercise!  Patient was oriented to gym and equipment including functions, settings, policies, and procedures.  Patient's individual exercise prescription and treatment plan were reviewed.  All starting workloads were established based on the results of the 6 minute walk test done at initial orientation visit.  The plan for exercise progression was also introduced and progression will be customized based on patient's performance and goals.              Exercise Goals and Review:   Exercise Goals Re-Evaluation :  Exercise Goals Re-Evaluation    Row Name 05/14/20 1148 05/30/20 1006 06/05/20 1413 06/18/20 1028 06/19/20 1258     Exercise Goal Re-Evaluation   Exercise Goals Review Able to understand and use rate of perceived exertion (RPE) scale;Knowledge and understanding of Target Heart Rate Range (THRR);Understanding of Exercise Prescription Able to understand and use rate of perceived exertion (RPE) scale;Knowledge and understanding of Target Heart Rate Range (THRR);Understanding of Exercise Prescription Increase Physical Activity;Increase Strength  and Stamina;Understanding of Exercise Prescription Increase Physical Activity;Increase Strength and Stamina;Understanding of Exercise Prescription Increase Physical Activity;Increase Strength and Stamina;Understanding of Exercise Prescription   Comments Reviewed RPE and dyspnea scales, THR and program prescription with pt today.  Pt voiced understanding and was given a copy of goals to take home. [name] does not do any exercise at home as of yet. He recent;y came back to rehab after he had a stent placed and feels much better, he rarely experiences chest pain and feels confident to start exercise at home. He has 3 different machines he can chose from. EP will review home exercise in the next couple of weeks. [name] is doing well in rehab.  He is now up to 1.7 METs on the recumbent elliptical and up to 4lb hand weights.  He should be ready to start to increase his workloads. [name] is doing well in rehab.  He has started to add in some exericse at home.  He is not sure how fast he is going on his treadmill so we talked about using his heart rate and RPE to help gauge his intensity. Reviewed home exercise with pt today.  Pt plans to walk and use treadmill at home for exercise.  He also has a cross traininer that he can use at home. Reviewed THR, pulse, RPE, sign and symptoms, pulse oximetery and when to call 911 or MD.  Also discussed weather considerations and indoor options.  Pt voiced understanding. [name] is doing well in rehab.  He has started to add in some exericse at home.  He is not sure how fast he is going on his treadmill so we talked about using his heart rate and RPE to help gauge his intensity. Reviewed home exercise with pt today.  Pt plans to walk and use treadmill at home for exercise.  He also has a cross traininer  that he can use at home. Reviewed THR, pulse, RPE, sign and symptoms, pulse oximetery and when to call 911 or MD.  Also discussed weather considerations and indoor options.  Pt voiced  understanding.   Expected Outcomes Short: Use RPE daily to regulate intensity. Long: Follow program prescription in THR. Short: Review home exercise and expectations Long: Able to exercise independently at home with no complications Short: Increase workloads Long: Continue to improve stamina Short: Continue to add in exercise at home and start using heart rate to judge  Long: Continue to improve stamina. Short: Continue to add in exercise at home and start using heart rate to judge  Long: Continue to improve stamina.   [place] Name 07/03/20 1252 07/11/20 9163 07/16/20 1247 07/29/20 1659 08/08/20 0958     Exercise Goal Re-Evaluation   Exercise Goals Review - Increase Physical Activity;Increase Strength and Stamina;Understanding of Exercise Prescription Increase Physical Activity;Increase Strength and Stamina;Understanding of Exercise Prescription Increase Physical Activity;Increase Strength and Stamina Increase Physical Activity;Increase Strength and Stamina;Understanding of Exercise Prescription   Comments [name] is progressing well and has increased resistance on XR.  He has moved up to 5 lb for strength work.  Staff will encourage increasing incline on TM and continued progression on seated machines. [name] is doing well in rehab.  He is feeling better and able to do his yard work again and has been blowing leaves recently. His stamina is better.  He will get back to his treadmill once he is done with leaves. [name] has increased TM during class to 2 and .5 incline.  He asked staff about a cramp in his calf that starts after 15 min on TM.  I had him stop and stretch and that helped.  He reports no swelling or redness in the area.  He did say his calves have always been very tight. Staff reviewed stretching for the calves. [name] has only attended 4 times so far this month.  Consistent exercise would be beneficial for progress.  Staff will monitor progress. [name] is doing well in rehab.  He is feeling better overall.  He  has been using his treadmill recently.  He hasn't been feeling as well this week.   Expected Outcomes Short:  continue to attend consistently and check HR on his own Long: increase MET level Short: Continue to exercise daily  Long: Get back to treadmill at home after leaf season Short: try to stretch calves daily Long:  walk without calf tightness Short: increase level on XR Long: improve TM walk and overall stamina Short: Continue to use treadmill daily Long: Continue to improve stamina.   [place] Name 08/12/20 1019             Exercise Goal Re-Evaluation   Exercise Goals Review Increase Physical Activity;Increase Strength and Stamina       Comments [name] will benefit from using his TM outside program sessions as he is only here 2 days per week.  We will monitor progress.       Expected Outcomes Short:  get in Tm outside porgram sessions Long:  build overall stamina              Discharge Exercise Prescription (Final Exercise Prescription Changes):  Exercise Prescription Changes - 08/12/20 1000      Response to Exercise   Blood Pressure (Admit) 144/70    Blood Pressure (Exercise) 134/70    Blood Pressure (Exit) 112/70    Heart Rate (Admit) 78 bpm    Heart Rate (  Exercise) 101 bpm    Heart Rate (Exit) 79 bpm    Rating of Perceived Exertion (Exercise) 12    Duration Continue with 30 min of aerobic exercise without signs/symptoms of physical distress.    Intensity THRR unchanged      Progression   Progression Continue to progress workloads to maintain intensity without signs/symptoms of physical distress.    Average METs 2.2      Resistance Training   Training Prescription Yes    Weight 5 lb    Reps 10-15      Interval Training   Interval Training No      NuStep   Level 4    SPM 80    Minutes 15    METs 2.3      Biostep-RELP   Level 4    SPM 80    Minutes 15    METs 2           Nutrition:  Target Goals: Understanding of nutrition guidelines, daily intake of sodium  <1544m, cholesterol <2037m calories 30% from fat and 7% or less from saturated fats, daily to have 5 or more servings of fruits and vegetables.  Education: All About Nutrition: -Group instruction provided by verbal, written material, interactive activities, discussions, models, and posters to present general guidelines for heart healthy nutrition including fat, fiber, MyPlate, the role of sodium in heart healthy nutrition, utilization of the nutrition label, and utilization of this knowledge for meal planning. Follow up email sent as well. Written material given at graduation. [facility]  Date 04/11/20  [facility] Review Code 1- Verbalizes Understanding  [need identified]      Biometrics:  Pre Biometrics - 05/07/20 1044      Pre Biometrics   Height 5' 9.1" (1.755 m)    Weight 231 lb 6.4 oz (105 kg)    BMI (Calculated) 34.08    Single Leg Stand 5.2 seconds            Nutrition Therapy Plan and Nutrition Goals:  Nutrition Therapy & Goals - 06/04/20 0925      Nutrition Therapy   Diet Heart healthy, low Na, CKD stg 4 MNT    Drug/Food Interactions Statins/Certain Fruits    Protein (specify units) 85g    Fiber 30 grams    Whole Grain Foods 3 servings    Saturated Fats 12 max. grams    Fruits and Vegetables 8 servings/day    Sodium 1.5 grams      Personal Nutrition Goals   Nutrition Goal ST: try canned salmon and beans (bean salad) to switch up protein. Add walnuts to breakfast or boild egg ~2x/week to add protein.  LT: get a variety of vegetables with moderate potassium intake, get a variety of protein foods    Comments No salt since July, not bread at everymeal, no red meat for 4 months. He is lactose intolerant. B: Bowl of life cereal with almond milk with water L: chicken sandwich or [place] whole wheat bread - sometimes no bread S: unsweet tea D: salads with sliced [place]oups (homemade chicken  noodle - reduced salt, vegetable beef soup, clear broth clam "chowder"), pork chops 1x/month, wife doesn't like fish - will get when out to eat, chicken and [place] of the time. Will have brown rice when he has grains. Vegetables: he loves green beans, corn, brussels sprouts, he doesn't like beets, okra, or asparagus. Uses  extra virgin olive oil  or pam spray. He doesn't like canned tuna that he doesn't like. No medication for diabetes, BG is well controlled. 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 Discussed heart healthy and T2DM eating. He would like to get a wider variety of protein.      Intervention Plan   Intervention Prescribe, educate and counsel regarding individualized specific dietary modifications aiming towards targeted core components such as weight, hypertension, lipid management, diabetes, heart failure and other comorbidities.;Nutrition handout(s) given to patient.    Expected Outcomes Short Term Goal: Understand basic principles of dietary content, such as calories, fat, sodium, cholesterol and nutrients.;Short Term Goal: A plan has been developed with personal nutrition goals set during dietitian appointment.;Long Term Goal: Adherence to prescribed nutrition plan.           Nutrition Assessments:  MEDIFICTS Score Key:  ?70 Need to make dietary changes   40-70 Heart Healthy Diet  ? 40 Therapeutic Level Cholesterol Diet   Picture Your Plate Scores:  <51 Unhealthy dietary pattern with much room for improvement.  41-50 Dietary pattern unlikely to meet recommendations for good health and room for improvement.  51-60 More healthful dietary pattern, with some room for improvement.   >60 Healthy dietary pattern, although there may be some specific behaviors that could be improved.    Nutrition Goals Re-Evaluation:  Nutrition Goals Re-Evaluation    [place] Name 06/18/20 1006 07/11/20 0958 08/08/20 1004         Goals   Nutrition Goal ST: try canned salmon and beans  (bean salad) to switch up protein. Add walnuts to breakfast or boild egg ~2x/week to add protein.  LT: get a variety of vegetables with moderate potassium intake, get a variety of protein foods Short: Continue to to add vareity  Long: Continue to focus on healthier eating. Short: Continue to to add vareity  Long: Continue to focus on healthier eating.     Comment [name] is doing well with his diet.  He has not tried the bean salad and prefers fresh fish versus canned.  He did admit to hamburger in his spaghetti.  He prefers chicken to read meat.  He eats a lot of salds and tries for two vegetables at each meal. [name] is doing well with his diet.  He feels that he is still retaining fluid but in now really watching his sodium intake.  He is washing his canned vegetables and no longer adding salt at the table.  He is eating a wide variety of food especially vegetables. Doug indulged some over the holidays, but is now ready to buckle down. He does miss bread, so we talked about finding a whole grain version.  He is really watching his sodium.  He has been doing better with variety of foods.     Expected Outcome Short: Continue to to add vareity  Long: Continue to focus on healthier eating. Short: Continue to watch sodium Long: Continue to eat healthy. Short: Find some wheat bread alternatives to help  Long: Continue to eat healthier            Nutrition Goals Discharge (Final Nutrition Goals Re-Evaluation):  Nutrition Goals Re-Evaluation - 08/08/20 1004      Goals   Nutrition Goal Short: Continue to to add vareity  Long: Continue to focus on healthier eating.    Comment Doug indulged some over the holidays, but is now ready to buckle down. He does miss bread, so we talked about  finding a whole grain version.  He is really watching his sodium.  He has been doing better with variety of foods.    Expected Outcome Short: Find some wheat bread alternatives to help  Long: Continue to eat healthier            Psychosocial: Target Goals: Acknowledge presence or absence of significant depression and/or stress, maximize coping skills, provide positive support system. Participant is able to verbalize types and ability to use techniques and skills needed for reducing stress and depression.   Education: Stress, Anxiety, and Depression - Group verbal and visual presentation to define topics covered.  Reviews how body is impacted by stress, anxiety, and depression.  Also discusses healthy ways to reduce stress and to treat/manage anxiety and depression.  Written material given at graduation. Flowsheet Row Cardiac Rehab from 08/08/2020 in [facility] Cardiac and Pulmonary Rehab  Date 07/11/20  Educator SB  Instruction Review Code 1- [employer] Understanding      Education: Sleep Hygiene -Provides group verbal and written instruction about how sleep can affect your health.  Define sleep hygiene, discuss sleep cycles and impact of sleep habits. Review good sleep hygiene tips.    Initial Review & Psychosocial Screening:   Quality of Life Scores:   Scores of 19 and below usually indicate a poorer quality of life in these areas.  A difference of  2-3 points is a clinically meaningful difference.  A difference of 2-3 points in the total score of the Quality of Life Index has been associated with significant improvement in overall quality of life, self-image, physical symptoms, and general health in studies assessing change in quality of life.  PHQ-9: Recent Review Flowsheet Data    Depression screen [facility] 2/9 05/07/2020 03/26/2020 05/24/2019   Decreased Interest 0 1 0   Down, Depressed, Hopeless 0 1 0   PHQ - 2 Score 0 2 0   Altered sleeping 1 3 -   Tired, decreased energy 1 2 -   Change in appetite 1 3 -   Feeling bad or failure about yourself  0 0 -   Trouble concentrating 0 0 -   Moving slowly or fidgety/restless 0 0 -   Suicidal thoughts 0 0 -   PHQ-9 Score 3 10 -   Difficult doing work/chores Not  difficult at all Not difficult at all -     Interpretation of Total Score  Total Score Depression Severity:  1-4 = Minimal depression, 5-9 = Mild depression, 10-14 = Moderate depression, 15-19 = Moderately severe depression, 20-27 = Severe depression   Psychosocial Evaluation and Intervention:   Psychosocial Re-Evaluation:  Psychosocial Re-Evaluation    Row Name 05/30/20 1016 06/18/20 1001 07/11/20 0955 08/08/20 1001       Psychosocial Re-Evaluation   Current issues with Current Sleep Concerns Current Sleep Concerns;Current Stress Concerns Current Sleep Concerns;Current Stress Concerns Current Sleep Concerns;Current Stress Concerns    Comments [name] is doing well mentally. He stated he has not slept well since a while ago since he is up a lot to go to the bathroom in the middle of the night. He has already talked to his doctor about it and has declined taking any sleep-aid or medications to help with sleep. Denied big stressors or anxiety related events. He has great support from his wife. He is feeling much better since he has his stent placed as he rarely feels chest pain. Therefore, he feels he can go out and do more which he is happy  about. [name] is doing well in rehab.  He is doing well mentally.  He takes it one day at a time and deals with things as they come.  He tries not to worry about anything and just does what he can.  He would love uninterrupted sleep, but his fluid keeps him getting up at night to go to bathroom.  He is able to get back to sleep afterwards pretty easily.  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  But he took control and asked for them to refrain from bad behavior and language. [name] is doing well mentally.  He continues to stay positive.  His bathroom trips have continued to keep him up at night, but they are getting better.  He is up to 3-3.5 hours before getting up. He is going  to vien specialist tomorrow to get worked up for fistula.  He is not excited about all this. [name] is doing well in rehab.  He continues to focus on the positive.  He has not felt well that past couple of days.  His sleep is staying about the same.  He does use tylenol PM on occassion, but we talked about using melatonin instead.  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    Expected Outcomes Short: Continue attending rehab consistently Long: Utilize exercise for stress management and maintain positive attitude Short: Continue to sleep as best as possible Long; Continue to stay positive. Short: Get through appointment tomorrow.  Long: Continue to stay positive Short: Try melatonin for sleep Long: Continue to stay positive.    Interventions Encouraged to attend Cardiac Rehabilitation for the exercise Encouraged to attend Cardiac Rehabilitation for the exercise Encouraged to attend Cardiac Rehabilitation for the exercise Encouraged to attend Cardiac Rehabilitation for the exercise    Continue Psychosocial Services  Follow up required by staff Follow up required by staff Follow up required by staff Follow up required by staff           Psychosocial Discharge (Final Psychosocial Re-Evaluation):  Psychosocial Re-Evaluation - 08/08/20 1001      Psychosocial Re-Evaluation   Current issues with Current Sleep Concerns;Current Stress Concerns    Comments [name] is doing well in rehab.  He continues to focus on the positive.  He has not felt well that past couple of days.  His sleep is staying about the same.  He does use tylenol PM on occassion, but we talked about using melatonin instead.  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    Expected Outcomes Short: Try melatonin for sleep Long: Continue to stay positive.    Interventions Encouraged to attend Cardiac Rehabilitation for the exercise    Continue Psychosocial  Services  Follow up required by staff           Vocational Rehabilitation: Provide vocational rehab assistance to qualifying candidates.   Vocational Rehab Evaluation & Intervention:   Education: Education Goals: Education classes will be provided on a variety of topics geared toward better understanding of heart health and risk factor modification. Participant will state understanding/return demonstration of topics presented as noted by education test scores.  Learning Barriers/Preferences:   General Cardiac Education Topics:  AED/CPR: - Group verbal and written instruction with the use of models  to demonstrate the basic use of the AED with the basic ABC's of resuscitation.   Anatomy and Cardiac Procedures: - Group verbal and visual presentation and models provide information about basic cardiac anatomy and function. Reviews the testing methods done to diagnose heart disease and the outcomes of the test results. Describes the treatment choices: Medical Management, Angioplasty, or Coronary Bypass Surgery for treating various heart conditions including Myocardial Infarction, Angina, Valve Disease, and Cardiac Arrhythmias.  Written material given at graduation. Flowsheet Row Cardiac Rehab from 08/08/2020 in [facility] Cardiac and Pulmonary Rehab  Date 03/26/20  Instruction Review Code 3- Needs Reinforcement  [need identified]      Medication Safety: - Group verbal and visual instruction to review commonly prescribed medications for heart and lung disease. Reviews the medication, class of the drug, and side effects. Includes the steps to properly store meds and maintain the prescription regimen.  Written material given at graduation.   Intimacy: - Group verbal instruction through game format to discuss how heart and lung disease can affect sexual intimacy. Written material given at graduation.. Flowsheet Row Cardiac Rehab from 08/08/2020 in [facility] Cardiac and Pulmonary Rehab  Date 05/23/20   Educator [facility]  Instruction Review Code 1- Verbalizes Understanding      Know Your Numbers and Heart Failure: - Group verbal and visual instruction to discuss disease risk factors for cardiac and pulmonary disease and treatment options.  Reviews associated critical values for Overweight/Obesity, Hypertension, Cholesterol, and Diabetes.  Discusses basics of heart failure: signs/symptoms and treatments.  Introduces Heart Failure Zone chart for action plan for heart failure.  Written material given at graduation. Flowsheet Row Cardiac Rehab from 08/08/2020 in [facility] Cardiac and Pulmonary Rehab  Date 06/25/20  Educator [facility]  Instruction Review Code 1- Verbalizes Understanding      Infection Prevention: - Provides verbal and written material to individual with discussion of infection control including proper hand washing and proper equipment cleaning during exercise session. Flowsheet Row Cardiac Rehab from 08/08/2020 in [facility] Cardiac and Pulmonary Rehab  Date 03/21/20  Educator [facility]  Instruction Review Code 1- Verbalizes Understanding      Falls Prevention: - Provides verbal and written material to individual with discussion of falls prevention and safety. Flowsheet Row Cardiac Rehab from 08/08/2020 in [facility] Cardiac and Pulmonary Rehab  Date 03/21/20  Educator [facility]  Instruction Review Code 1- Verbalizes Understanding      Other: -Provides group and verbal instruction on various topics (see comments)   Knowledge Questionnaire Score:   Core Components/Risk Factors/Patient Goals at Admission:   Education:Diabetes - Individual verbal and written instruction to review signs/symptoms of diabetes, desired ranges of glucose level fasting, after meals and with exercise. Acknowledge that pre and post exercise glucose checks will be done for 3 sessions at entry of program. [place] from 08/08/2020 in [facility] Cardiac and Pulmonary Rehab  Date 03/21/20  Educator [facility]  Instruction Review  Code 1- Verbalizes Understanding      Core Components/Risk Factors/Patient Goals Review:   Goals and Risk Factor Review    Row Name 05/30/20 1010 06/18/20 1003 07/11/20 1002 08/08/20 1007       Core Components/Risk Factors/Patient Goals Review   Personal Goals Review Weight Management/Obesity;Hypertension;Lipids;Diabetes Weight Management/Obesity;Hypertension;Diabetes Weight Management/Obesity;Hypertension;Diabetes Weight Management/Obesity;Hypertension;Diabetes    Review Tyrek takes his blood sugars at home once per day which have been stable. He also takes BP at home,  and reports 130  or lower for systolic below 80 for diastolic. BPs at rehab have  also been in appropriate range.  He weighs himself every morning- stable with weight but wants to lose weight as one of his main goals. He same given up white bread and has consumed less simple carbs. He has a meeting with the RD soon to talk more about weight loss and establish nutrition goals.H feels his pants are big and shirt feels looser. [name] is doing well in rehab.  His weight is staying steady. He would like to lose more, reviewed exercise today so hopefully adding some in will be helpful.  His left calf pain has resolved but his right calf still has a slight knot.  He is feeling a lot better.  His blood pressures have been doing well and staying below 150s.  He has also gotten away from using salt.  His sugars are doing well and he reports they are good. His A1c is down to 7.3!!  He is pleased with his progress.  He is doing well with his medications overall.The [place] is doing better now at the lower dose. [name] is doing well in rehab. His weight continues to fluctuate up and down.  At home he is around 227 lb and continues to trend down overall. His blood pressures have been good in class and at home.  His sugars continue to do well too. [name] is doing well in rehab.  His weight was up over the holidays but starting to come back down again.  He  wants to get rid of his holiday weight.  His sugars were up some but getting back to better control and generally staying below 150s overall.  His blood pressures have been slightly elevated in class this week, but have been good at home.  He usually stays below 130s at home.  He has not had his meds yet today.    Expected Outcomes Short: Meet with RD to discuss weight loss goals Long: Continue to manage lifestyle risk factors Short: Continue to work on weight loss long; Continue to monitor risk factors. Short: Continue to work on weight loss long; Continue to monitor risk factors. Short: Continue to work weight loss Long: Continue to monitor risk factors.           Core Components/Risk Factors/Patient Goals at Discharge (Final Review):   Goals and Risk Factor Review - 08/08/20 1007      Core Components/Risk Factors/Patient Goals Review   Personal Goals Review Weight Management/Obesity;Hypertension;Diabetes    Review [name] is doing well in rehab.  His weight was up over the holidays but starting to come back down again.  He wants to get rid of his holiday weight.  His sugars were up some but getting back to better control and generally staying below 150s overall.  His blood pressures have been slightly elevated in class this week, but have been good at home.  He usually stays below 130s at home.  He has not had his meds yet today.    Expected Outcomes Short: Continue to work weight loss Long: Continue to monitor risk factors.           ITP Comments:  ITP Comments    Row Name 05/07/20 1040 05/08/20 0608 05/14/20 1148 05/14/20 1150 06/05/20 0608   ITP Comments Completed 6MWT and gym orientation. Initial ITP created and sent for review to Dr. Emily Filbert, Medical Director.  Restarting with new stents and MD. 30 Day review completed. Medical Director ITP review done, changes made as directed, and signed approval by Medical Director. First  full day of exercise!  Patient was oriented to gym and  equipment including functions, settings, policies, and procedures.  Patient's individual exercise prescription and treatment plan were reviewed.  All starting workloads were established based on the results of the 6 minute walk test done at initial orientation visit.  The plan for exercise progression was also introduced and progression will be customized based on patient's performance and goals. [name] is returning after new stent placed for persistent angina. 30 Day review completed. Medical Director ITP review done, changes made as directed, and signed approval by Medical Director.   [place] Name 07/03/20 0907 07/31/20 0521 08/28/20 0845       ITP Comments 30 Day review completed. Medical Director ITP review done, changes made as directed, and signed approval by Medical Director. 30 Day review completed. Medical Director ITP review done, changes made as directed, and signed approval by Medical Director. 30 Day review completed. Medical Director ITP review done, changes made as directed, and signed approval by Medical Director.            Comments:

## 2020-08-29 ENCOUNTER — Other Ambulatory Visit: Payer: Self-pay

## 2020-08-29 DIAGNOSIS — I214 Non-ST elevation (NSTEMI) myocardial infarction: Secondary | ICD-10-CM

## 2020-08-29 DIAGNOSIS — Z955 Presence of coronary angioplasty implant and graft: Secondary | ICD-10-CM

## 2020-08-29 NOTE — Progress Notes (Signed)
Daily Session Note  Patient Details  Name: Jeremy Dorsey MRN: [id number] Date of Birth: 1951-07-04 Referring Provider:   Flowsheet Row Cardiac Rehab from 05/07/2020 in [facility] Cardiac and Pulmonary Rehab  Referring Provider [name] MD      Encounter Date: 08/29/2020  Check In:  Session Check In - 08/29/20 0949      Check-In   Supervising physician immediately available to respond to emergencies See telemetry face sheet for immediately available ER MD    Location [facility]-Cardiac & Pulmonary Rehab    Staff Present Birdie Sons, MPA, RN;Laureen Owens Shark, BS, RRT, CPFT;Kara Eliezer Bottom, MS Exercise Physiologist    Virtual Visit No    Medication changes reported     No    Fall or balance concerns reported    No    Tobacco Cessation No Change    Warm-up and Cool-down Performed on first and last piece of equipment    Resistance Training Performed Yes    VAD Patient? No    PAD/SET Patient? No      Pain Assessment   Currently in Pain? No/denies              Social History   Tobacco Use  Smoking Status Former Smoker  . Packs/day: 1.00  . Years: 30.00  . Pack years: 30.00  . Types: Cigarettes  . Quit date: 08/23/2007  . Years since quitting: 13.0  Smokeless Tobacco Never Used    Goals Met:  Independence with exercise equipment Exercise tolerated well No report of cardiac concerns or symptoms Strength training completed today  Goals Unmet:  Not Applicable  Comments: Pt able to follow exercise prescription today without complaint.  Will continue to monitor for progression.    Dr. Emily Filbert is Medical Director for [place] and [name].

## 2020-09-03 ENCOUNTER — Encounter: Payer: PPO | Attending: Cardiovascular Disease

## 2020-09-03 DIAGNOSIS — Z955 Presence of coronary angioplasty implant and graft: Secondary | ICD-10-CM | POA: Insufficient documentation

## 2020-09-05 ENCOUNTER — Other Ambulatory Visit: Payer: Self-pay

## 2020-09-05 DIAGNOSIS — Z955 Presence of coronary angioplasty implant and graft: Secondary | ICD-10-CM | POA: Diagnosis not present

## 2020-09-05 DIAGNOSIS — I214 Non-ST elevation (NSTEMI) myocardial infarction: Secondary | ICD-10-CM

## 2020-09-05 NOTE — Progress Notes (Signed)
Daily Session Note  Patient Details  Name: GABERIEL YOUNGBLOOD MRN: [id number] Date of Birth: 1950/09/01 Referring Provider:   Flowsheet Row Cardiac Rehab from 05/07/2020 in [facility] Cardiac and Pulmonary Rehab  Referring Provider [name] MD      Encounter Date: 09/05/2020  Check In:  Session Check In - 09/05/20 0934      Check-In   Supervising physician immediately available to respond to emergencies See telemetry face sheet for immediately available ER MD    Location [facility]-Cardiac & Pulmonary Rehab    Staff Present Birdie Sons, MPA, RN;Melissa Caiola RDN, Rowe Pavy, BA, ACSM CEP, Exercise Physiologist;Jessica Thornton, MA, RCEP, CCRP, CCET    Virtual Visit No    Medication changes reported     No    Fall or balance concerns reported    No    Tobacco Cessation No Change    Warm-up and Cool-down Performed on first and last piece of equipment    Resistance Training Performed Yes    VAD Patient? No    PAD/SET Patient? No      Pain Assessment   Currently in Pain? No/denies              Social History   Tobacco Use  Smoking Status Former Smoker  . Packs/day: 1.00  . Years: 30.00  . Pack years: 30.00  . Types: Cigarettes  . Quit date: 08/23/2007  . Years since quitting: 13.0  Smokeless Tobacco Never Used    Goals Met:  Independence with exercise equipment Exercise tolerated well No report of cardiac concerns or symptoms Strength training completed today  Goals Unmet:  Not Applicable  Comments: Pt able to follow exercise prescription today without complaint.  Will continue to monitor for progression.    Dr. Emily Filbert is Medical Director for [place] and [name].

## 2020-09-10 ENCOUNTER — Other Ambulatory Visit: Payer: Self-pay

## 2020-09-10 ENCOUNTER — Encounter: Payer: PPO | Admitting: *Deleted

## 2020-09-10 DIAGNOSIS — Z955 Presence of coronary angioplasty implant and graft: Secondary | ICD-10-CM | POA: Diagnosis not present

## 2020-09-10 NOTE — Progress Notes (Signed)
Daily Session Note  Patient Details  Name: Jeremy Dorsey MRN: [id number] Date of Birth: 1951-06-11 Referring Provider:   Flowsheet Row Cardiac Rehab from 05/07/2020 in [facility] Cardiac and Pulmonary Rehab  Referring Provider Jeremy Rogue MD      Encounter Date: 09/10/2020  Check In:  Session Check In - 09/10/20 1019      Check-In   Supervising physician immediately available to respond to emergencies See telemetry face sheet for immediately available ER MD    Location [facility]-Cardiac & Pulmonary Rehab    Staff Present Heath Lark, RN, BSN, CCRP;Joseph Foy [place], [place], ACSM CEP, Exercise Physiologist;Kara Eliezer Bottom, MS Exercise Physiologist    Virtual Visit No    Medication changes reported     No    Fall or balance concerns reported    No    Warm-up and Cool-down Performed on first and last piece of equipment    Resistance Training Performed Yes    VAD Patient? No    PAD/SET Patient? No      Pain Assessment   Currently in Pain? No/denies              Social History   Tobacco Use  Smoking Status Former Smoker  . Packs/day: 1.00  . Years: 30.00  . Pack years: 30.00  . Types: Cigarettes  . Quit date: 08/23/2007  . Years since quitting: 13.0  Smokeless Tobacco Never Used    Goals Met:  Independence with exercise equipment Exercise tolerated well No report of cardiac concerns or symptoms  Goals Unmet:  Not Applicable  Comments: Pt able to follow exercise prescription today without complaint.  Will continue to monitor for progression.    Dr. Emily Filbert is Medical Director for [place] and [name].

## 2020-09-12 ENCOUNTER — Other Ambulatory Visit: Payer: Self-pay

## 2020-09-12 DIAGNOSIS — I214 Non-ST elevation (NSTEMI) myocardial infarction: Secondary | ICD-10-CM

## 2020-09-12 DIAGNOSIS — Z955 Presence of coronary angioplasty implant and graft: Secondary | ICD-10-CM | POA: Diagnosis not present

## 2020-09-12 NOTE — Progress Notes (Signed)
Daily Session Note  Patient Details  Name: Jeremy Dorsey MRN: [id number] Date of Birth: 09-19-1950 Referring Provider:   Flowsheet Row Cardiac Rehab from 05/07/2020 in [facility] Cardiac and Pulmonary Rehab  Referring Provider [name] MD      Encounter Date: 09/12/2020  Check In:  Session Check In - 09/12/20 0958      Check-In   Supervising physician immediately available to respond to emergencies See telemetry face sheet for immediately available ER MD    Location [facility]-Cardiac & Pulmonary Rehab    Staff Present Birdie Sons, MPA, RN;Melissa Caiola RDN, Rowe Pavy, BA, ACSM CEP, Exercise Physiologist    Virtual Visit No    Medication changes reported     No    Fall or balance concerns reported    No    Tobacco Cessation No Change    Warm-up and Cool-down Performed on first and last piece of equipment    Resistance Training Performed Yes    VAD Patient? No    PAD/SET Patient? No      Pain Assessment   Currently in Pain? No/denies              Social History   Tobacco Use  Smoking Status Former Smoker  . Packs/day: 1.00  . Years: 30.00  . Pack years: 30.00  . Types: Cigarettes  . Quit date: 08/23/2007  . Years since quitting: 13.0  Smokeless Tobacco Never Used    Goals Met:  Independence with exercise equipment Exercise tolerated well No report of cardiac concerns or symptoms Strength training completed today  Goals Unmet:  Not Applicable  Comments: Pt able to follow exercise prescription today without complaint.  Will continue to monitor for progression.    Dr. Emily Filbert is Medical Director for [place] and [name].

## 2020-09-19 ENCOUNTER — Other Ambulatory Visit: Payer: Self-pay

## 2020-09-19 DIAGNOSIS — Z955 Presence of coronary angioplasty implant and graft: Secondary | ICD-10-CM | POA: Diagnosis not present

## 2020-09-19 DIAGNOSIS — I214 Non-ST elevation (NSTEMI) myocardial infarction: Secondary | ICD-10-CM

## 2020-09-19 NOTE — Progress Notes (Signed)
Daily Session Note  Patient Details  Name: Jeremy Dorsey MRN: [id number] Date of Birth: 06-21-1951 Referring Provider:   Flowsheet Row Cardiac Rehab from 05/07/2020 in [facility]  Referring Provider [name] MD      Encounter Date: 09/19/2020  Check In:  Session Check In - 09/19/20 1014      Check-In   Supervising physician immediately available to respond to emergencies See telemetry face sheet for immediately available ER MD    Location [facility]-Cardiac & Pulmonary Rehab    Staff Present Birdie Sons, MPA, RN;Melissa Caiola RDN, Rowe Pavy, BA, ACSM CEP, Exercise Physiologist    Virtual Visit No    Medication changes reported     No    Fall or balance concerns reported    No    Tobacco Cessation No Change    Warm-up and Cool-down Performed on first and last piece of equipment    Resistance Training Performed Yes    VAD Patient? No    PAD/SET Patient? No      Pain Assessment   Currently in Pain? No/denies              Social History   Tobacco Use  Smoking Status Former Smoker  . Packs/day: 1.00  . Years: 30.00  . Pack years: 30.00  . Types: Cigarettes  . Quit date: 08/23/2007  . Years since quitting: 13.0  Smokeless Tobacco Never Used    Goals Met:  Independence with exercise equipment Exercise tolerated well No report of cardiac concerns or symptoms Strength training completed today  Goals Unmet:  Not Applicable  Comments: Pt able to follow exercise prescription today without complaint.  Will continue to monitor for progression.    Dr. Emily Filbert is Medical Director for [place] and [name].

## 2020-09-24 ENCOUNTER — Other Ambulatory Visit: Payer: Self-pay

## 2020-09-24 DIAGNOSIS — I214 Non-ST elevation (NSTEMI) myocardial infarction: Secondary | ICD-10-CM

## 2020-09-24 DIAGNOSIS — Z955 Presence of coronary angioplasty implant and graft: Secondary | ICD-10-CM | POA: Diagnosis not present

## 2020-09-24 NOTE — Progress Notes (Signed)
Daily Session Note  Patient Details  Name: Jeremy Dorsey MRN: [id number] Date of Birth: [birth year] Referring Provider:   Flowsheet Row Cardiac Rehab from 05/07/2020 in [facility]  Referring Provider [name] MD      Encounter Date: 09/24/2020  Check In:  Session Check In - 09/24/20 0958      Check-In   Supervising physician immediately available to respond to emergencies See telemetry face sheet for immediately available ER MD    Location [facility]    Staff Present Nada Maclachlan, BA, ACSM CEP, Exercise Physiologist;Javaun Dimperio Rosalia Hammers, MPA, Nino Glow, MS Exercise Physiologist    Virtual Visit No    Medication changes reported     No    Fall or balance concerns reported    No    Tobacco Cessation No Change    Warm-up and Cool-down Performed on first and last piece of equipment    Resistance Training Performed Yes    VAD Patient? No    PAD/SET Patient? No      Pain Assessment   Currently in Pain? No/denies              Social History   Tobacco Use  Smoking Status Former Smoker  . Packs/day: 1.00  . Years: 30.00  . Pack years: 30.00  . Types: Cigarettes  . Quit date: 08/23/2007  . Years since quitting: 13.0  Smokeless Tobacco Never Used    Goals Met:  Independence with exercise equipment Exercise tolerated well No report of cardiac concerns or symptoms Strength training completed today  Goals Unmet:  Not Applicable  Comments: Pt able to follow exercise prescription today without complaint.  Will continue to monitor for progression.    Dr. Emily Filbert is Medical Director for [place] and [name].

## 2020-09-25 ENCOUNTER — Encounter: Payer: Self-pay | Admitting: *Deleted

## 2020-09-25 DIAGNOSIS — Z955 Presence of coronary angioplasty implant and graft: Secondary | ICD-10-CM

## 2020-09-25 NOTE — Progress Notes (Signed)
Cardiac Individual Treatment Plan  Patient Details  Name: Jeremy Dorsey MRN: [id number] Date of Birth: 11-Jul-1951 Referring Provider:   Flowsheet Row Cardiac Rehab from 05/07/2020 in [facility] Cardiac and Pulmonary Rehab  Referring Provider [name] MD      Initial Encounter Date:  Flowsheet Row Cardiac Rehab from 05/07/2020 in [facility] Cardiac and Pulmonary Rehab  Date 05/07/20      Visit Diagnosis: Status post coronary artery stent placement  Patient's Home Medications on Admission:  Current Outpatient Medications:  .  aspirin 81 MG EC tablet, Take 1 tablet (81 mg total) by mouth daily. Swallow whole., Disp: 30 tablet, Rfl: 11 .  atorvastatin (LIPITOR) 80 MG tablet, Take 1 tablet (80 mg total) by mouth daily., Disp: 90 tablet, Rfl: 1 .  calcitRIOL (ROCALTROL) 0.25 MCG capsule, Take 0.5 mcg by mouth daily. (Prescription instructions say to take 2 capsules by mouth daily), Disp: , Rfl:  .  clopidogrel (PLAVIX) 75 MG tablet, Take 1 tablet (75 mg total) by mouth daily with breakfast., Disp: 90 tablet, Rfl: 1 .  furosemide (LASIX) 40 MG tablet, Take 40 mg by mouth 2 (two) times daily., Disp: , Rfl:  .  metoprolol tartrate (LOPRESSOR) 25 MG tablet, Take 1 tablet (25 mg total) by mouth 2 (two) times daily., Disp: 180 tablet, Rfl: 1 .  nitroGLYCERIN (NITROSTAT) 0.4 MG SL tablet, Place 1 tablet (0.4 mg total) under the tongue every 5 (five) minutes x 3 doses as needed for chest pain., Disp: 25 tablet, Rfl: 12 .  sitaGLIPtin (JANUVIA) 25 MG tablet, Take 1 tablet (25 mg total) by mouth daily., Disp: 30 tablet, Rfl: 3 .  sodium bicarbonate 650 MG tablet, Take 650 mg by mouth 2 (two) times daily. , Disp: , Rfl:  .  sodium zirconium cyclosilicate (LOKELMA) 10 g PACK packet, Take 10 g by mouth 3 (three) times a week., Disp: , Rfl:   Past Medical History: Past Medical History:  Diagnosis Date  . Anemia   . CKD (chronic kidney disease), stage IV ([place])   . Coronary artery disease    a. s/p  IVUS-guided DESx2 to prox & mid LAD, residual disease treated medically. EF 50-55% by recent echo 02/2020.  . 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 Hyperlipidemia LDL goal <70   . Hypertension 2008  . Lower extremity edema   . Shoulder pain    Right  . Urinary complication     Tobacco Use: Social History   Tobacco Use  Smoking Status Former Smoker  . Packs/day: 1.00  . Years: 30.00  . Pack years: 30.00  . Types: Cigarettes  . Quit date: 08/23/2007  . Years since quitting: 13.1  Smokeless Tobacco Never Used    Labs: Recent Chemical engineer    Labs for ITP Cardiac and Pulmonary Rehab Latest Ref Rng & Units 02/22/2017 06/10/2018 03/01/2020 03/02/2020 05/22/2020   Cholestrol 0 - 200 mg/dL - - 158 137 -   LDLCALC 0 - 99 mg/dL - - 92 88 -   HDL >40 mg/dL - - 31(L) 28(L) -   Trlycerides <150 mg/dL - - 173(H) 106 -   Hemoglobin A1c 4.0 - 5.6 % 9.1 8.0(A) 7.3(H) - 7.3(A)       Exercise Target Goals: Exercise Program Goal: Individual exercise prescription set using results from initial 6 min walk test and THRR while considering  patient's activity barriers and safety.   Exercise Prescription Goal: Initial exercise prescription builds to 30-45 minutes a day of  aerobic activity, 2-3 days per week.  Home exercise guidelines will be given to patient during program as part of exercise prescription that the participant will acknowledge.   Education: Aerobic Exercise: - Group verbal and visual presentation on the components of exercise prescription. Introduces F.I.T.T principle from ACSM for exercise prescriptions.  Reviews F.I.T.T. principles of aerobic exercise including progression. Written material given at graduation. Flowsheet Row Cardiac Rehab from 08/29/2020 in [facility] Cardiac and Pulmonary Rehab  Date 05/23/20  Educator [facility]  Instruction Review Code 1- Verbalizes Understanding      Education: Resistance Exercise: - Group verbal and visual presentation on the  components of exercise prescription. Introduces F.I.T.T principle from ACSM for exercise prescriptions  Reviews F.I.T.T. principles of resistance exercise including progression. Written material given at graduation.    Education: Exercise & Equipment Safety: - Individual verbal instruction and demonstration of equipment use and safety with use of the equipment. Flowsheet Row Cardiac Rehab from 08/29/2020 in [facility] Cardiac and Pulmonary Rehab  Date 03/21/20  Educator [facility]  Instruction Review Code 1- Verbalizes Understanding      Education: Exercise Physiology & General Exercise Guidelines: - Group verbal and written instruction with models to review the exercise physiology of the cardiovascular system and associated critical values. Provides general exercise guidelines with specific guidelines to those with heart or lung disease.  Flowsheet Row Cardiac Rehab from 08/29/2020 in [facility] Cardiac and Pulmonary Rehab  Date 03/26/20  Instruction Review Code 3- Needs Reinforcement  [need identified]      Education: Flexibility, Balance, Mind/Body Relaxation: - Group verbal and visual presentation with interactive activity on the components of exercise prescription. Introduces F.I.T.T principle from ACSM for exercise prescriptions. Reviews F.I.T.T. principles of flexibility and balance exercise training including progression. Also discusses the mind body connection.  Reviews various relaxation techniques to help reduce and manage stress (i.e. Deep breathing, progressive muscle relaxation, and visualization). Balance handout provided to take home. Written material given at graduation. Flowsheet Row Cardiac Rehab from 08/29/2020 in [facility] Cardiac and Pulmonary Rehab  Date 08/08/20  Rosanna Randy to repeat]  Educator [facility]  Instruction Review Code 1- Verbalizes Understanding      Activity Barriers & Risk Stratification:  Activity Barriers & Cardiac Risk Stratification - 05/07/20 1041      Activity Barriers & Cardiac  Risk Stratification   Activity Barriers Joint Problems;Deconditioning;Muscular Weakness;Shortness of Breath;Balance Concerns;History of Falls;Other (comment)    Comments pain in legs with walking (claudication? no PAD Dx)    Cardiac Risk Stratification High           6 Minute Walk:  6 Minute Walk    Row Name 05/07/20 1040 09/12/20 1157       6 Minute Walk   Phase Initial Discharge    Distance 925 feet 1300 feet    Distance % Change -- 40.5 %    Distance Feet Change -- 375 ft    Walk Time 6 minutes 6 minutes    # of Rest Breaks 0 0    MPH 1.75 2.46    METS 1.87 2.98    RPE 13 15    Perceived Dyspnea  -- 0    VO2 Peak 6.5 10.43    Symptoms Yes (comment) Yes (comment)    Comments pain in calves 9/10, legs fatigued calf pain 7/10    Resting HR 73 bpm 67 bpm    Resting BP 128/60 148/78    Resting Oxygen Saturation  96 % --    Exercise Oxygen Saturation  during 6 min walk 99 % --    Max Ex. HR 99 bpm 113 bpm    Max Ex. BP 132/70 172/72    2 Minute Post BP 122/62 --           Oxygen Initial Assessment:   Oxygen Re-Evaluation:   Oxygen Discharge (Final Oxygen Re-Evaluation):   Initial Exercise Prescription:  Initial Exercise Prescription - 05/07/20 1000      Date of Initial Exercise RX and Referring Provider   Date 05/07/20    Referring Provider [name] MD      Treadmill   MPH 1.7    Grade 0    Minutes 15    METs 2.3      Recumbant Bike   Level 1    RPM 50    Watts 5    Minutes 15    METs 2      NuStep   Level 1    SPM 80    Minutes 15    METs 2      REL-XR   Level 1    Speed 50    Minutes 15    METs 2      Prescription Details   Frequency (times per week) 2    Duration Progress to 30 minutes of continuous aerobic without signs/symptoms of physical distress      Intensity   THRR 40-80% of Max Heartrate 104-135    Ratings of Perceived Exertion 11-13    Perceived Dyspnea 0-4      Progression   Progression Continue to progress  workloads to maintain intensity without signs/symptoms of physical distress.      Resistance Training   Training Prescription Yes    Weight 3 lb    Reps 10-15           Perform Capillary Blood Glucose checks as needed.  Exercise Prescription Changes:  Exercise Prescription Changes    Row Name 05/07/20 1000 05/20/20 1200 06/05/20 1400 06/18/20 1000 06/19/20 1200     Response to Exercise   Blood Pressure (Admit) 128/60 122/68 126/66 -- 124/64   Blood Pressure (Exercise) 132/70 132/64 124/72 -- 142/64   Blood Pressure (Exit) 122/62 128/72 120/70 -- 130/68   Heart Rate (Admit) 73 bpm 76 bpm 71 bpm -- 86 bpm   Heart Rate (Exercise) 99 bpm 110 bpm 105 bpm -- 108 bpm   Heart Rate (Exit) 74 bpm 91 bpm 77 bpm -- 90 bpm   Oxygen Saturation (Admit) 96 % -- -- -- --   Oxygen Saturation (Exercise) 99 % -- -- -- --   Rating of Perceived Exertion (Exercise) _0 -- 15   Symptoms legs fatigued, calve pain 9/10 -- none -- --   Comments walk test results first day -- -- --   Duration -- Progress to 30 minutes of  aerobic without signs/symptoms of physical distress Continue with 30 min of aerobic exercise without signs/symptoms of physical distress. -- Continue with 30 min of aerobic exercise without signs/symptoms of physical distress.   Intensity -- THRR unchanged THRR unchanged -- THRR unchanged     Progression   Progression -- Continue to progress workloads to maintain intensity without signs/symptoms of physical distress. Continue to progress workloads to maintain intensity without signs/symptoms of physical distress. -- Continue to progress workloads to maintain intensity without signs/symptoms of physical distress.   Average METs -- 2.2 2.06 -- 2.2     Resistance Training   Training Prescription -- Yes Yes --  Yes   Weight -- 3 lb 4 lb -- 5 lb   Reps -- 10-15 10-15 -- 10-15     Interval Training   Interval Training -- -- No -- No     Treadmill   MPH -- 2.5 1.7 -- 1.7   Grade --  0.5 0.5 -- 0.5   Minutes -- 15 15 -- 15   METs -- 3.09 2.42 -- 2.42     REL-XR   Level -- 3 1 -- 3   Speed -- 50 -- -- 50   Minutes -- 15 15 -- 15   METs -- 1.3 1.7 -- --     Home Exercise Plan   Plans to continue exercise at -- -- -- Home (comment)  walking, treadmill, cross trainer Home (comment)  walking, treadmill, cross trainer   Frequency -- -- -- Add 3 additional days to program exercise sessions. Add 3 additional days to program exercise sessions.   Initial Home Exercises Provided -- -- -- 06/18/20 06/18/20   Row Name 07/03/20 1200 07/16/20 1200 07/29/20 1600 08/12/20 1000 09/09/20 1600     Response to Exercise   Blood Pressure (Admit) 128/64 124/64 124/70 144/70 122/64   Blood Pressure (Exercise) 150/68 132/70 136/70 134/70 130/70   Blood Pressure (Exit) 148/74 118/60 132/70 112/70 124/72   Heart Rate (Admit) 86 bpm 82 bpm 81 bpm 78 bpm 96 bpm   Heart Rate (Exercise) 104 bpm 100 bpm 103 bpm 101 bpm 114 bpm   Heart Rate (Exit) 86 bpm 71 bpm 76 bpm 79 bpm 98 bpm   Rating of Perceived Exertion (Exercise) _0 Duration Continue with 30 min of aerobic exercise without signs/symptoms of physical distress. Continue with 30 min of aerobic exercise without signs/symptoms of physical distress. Continue with 30 min of aerobic exercise without signs/symptoms of physical distress. Continue with 30 min of aerobic exercise without signs/symptoms of physical distress. Continue with 30 min of aerobic exercise without signs/symptoms of physical distress.   Intensity _1      Progression   Progression Continue to progress workloads to maintain intensity without signs/symptoms of physical distress. Continue to progress workloads to maintain intensity without signs/symptoms of physical distress. Continue to progress workloads to maintain intensity without signs/symptoms of physical distress. Continue to progress  workloads to maintain intensity without signs/symptoms of physical distress. Continue to progress workloads to maintain intensity without signs/symptoms of physical distress.   Average METs 3.6 2.3 2.2 2.2 2.35     Resistance Training   Training Prescription _2    Weight 5 lb 5 lb 5 lb 5 lb 5 lb   Reps 10-15 10-15 10-15 10-15 10-15     Interval Training   Interval Training -- No No No No     Treadmill   MPH 1._3 -- --   Grade 0.5 0.5 0.5 -- --   Minutes _4 -- --   METs 2.42 2.67 2.67 -- --     NuStep   Level -- -- -- 4 3   SPM -- -- -- 80 80   Minutes -- -- -- 15 15   METs -- -- -- 2.3 2.7     REL-XR   Level 3 -- 3 -- --   Speed 50 -- 50 -- --   Minutes 15 -- 15 -- --   METs 4.8 -- 1.3 -- --  Biostep-RELP   Level -- -- -- 4 1   SPM -- -- -- 80 80   Minutes -- -- -- 15 15   METs -- -- -- 2 2     Home Exercise Plan   Plans to continue exercise at Home (comment)  walking, treadmill, cross trainer Home (comment)  walking, treadmill, cross trainer Home (comment)  walking, treadmill, cross trainer -- Home (comment)  walking, treadmill, cross trainer   Frequency Add 3 additional days to program exercise sessions. Add 3 additional days to program exercise sessions. Add 3 additional days to program exercise sessions. -- Add 3 additional days to program exercise sessions.   Initial Home Exercises Provided 06/18/20 06/18/20 06/18/20 -- 06/18/20   Row Name 09/24/20 1600             Response to Exercise   Blood Pressure (Admit) 148/64       Blood Pressure (Exercise) 152/70       Blood Pressure (Exit) 142/72       Heart Rate (Admit) 81 bpm       Heart Rate (Exercise) 110 bpm       Heart Rate (Exit) 91 bpm       Rating of Perceived Exertion (Exercise) 16       Symptoms none       Duration Continue with 30 min of aerobic exercise without signs/symptoms of physical distress.       Intensity THRR unchanged               Progression   Progression  Continue to progress workloads to maintain intensity without signs/symptoms of physical distress.       Average METs 2.32               Resistance Training   Training Prescription Yes       Weight 5 lb       Reps 10-15               Interval Training   Interval Training No               Treadmill   MPH 2       Grade 0.5       Minutes 15       METs 2.67               NuStep   Level 4       Minutes 15       METs 3.6               REL-XR   Level 3       Minutes 15       METs 1.4               Biostep-RELP   Level 1       Minutes 15       METs 2               Home Exercise Plan   Plans to continue exercise at Home (comment)  walking, treadmill, cross trainer       Frequency Add 3 additional days to program exercise sessions.       Initial Home Exercises Provided 06/18/20              Exercise Comments:  Exercise Comments    Row Name 05/14/20 1148           Exercise Comments First full day of exercise!  Patient was  oriented to gym and equipment including functions, settings, policies, and procedures.  Patient's individual exercise prescription and treatment plan were reviewed.  All starting workloads were established based on the results of the 6 minute walk test done at initial orientation visit.  The plan for exercise progression was also introduced and progression will be customized based on patient's performance and goals.              Exercise Goals and Review:   Exercise Goals Re-Evaluation :  Exercise Goals Re-Evaluation    Row Name 05/14/20 1148 05/30/20 1006 06/05/20 1413 06/18/20 1028 06/19/20 1258     Exercise Goal Re-Evaluation   Exercise Goals Review Able to understand and use rate of perceived exertion (RPE) scale;Knowledge and understanding of Target Heart Rate Range (THRR);Understanding of Exercise Prescription Able to understand and use rate of perceived exertion (RPE) scale;Knowledge and understanding of Target Heart Rate Range  (THRR);Understanding of Exercise Prescription Increase Physical Activity;Increase Strength and Stamina;Understanding of Exercise Prescription Increase Physical Activity;Increase Strength and Stamina;Understanding of Exercise Prescription Increase Physical Activity;Increase Strength and Stamina;Understanding of Exercise Prescription   Comments Reviewed RPE and dyspnea scales, THR and program prescription with pt today.  Pt voiced understanding and was given a copy of goals to take home. Jeremy Dorsey does not do any exercise at home as of yet. He recent;y came back to rehab after he had a stent placed and feels much better, he rarely experiences chest pain and feels confident to start exercise at home. He has 3 different machines he can chose from. EP will review home exercise in the next couple of weeks. Jeremy Dorsey is doing well in rehab.  He is now up to 1.7 METs on the recumbent elliptical and up to 4lb hand weights.  He should be ready to start to increase his workloads. Jeremy Dorsey is doing well in rehab.  He has started to add in some exericse at home.  He is not sure how fast he is going on his treadmill so we talked about using his heart rate and RPE to help gauge his intensity. Reviewed home exercise with pt today.  Pt plans to walk and use treadmill at home for exercise.  He also has a cross traininer that he can use at home. Reviewed THR, pulse, RPE, sign and symptoms, pulse oximetery and when to call 911 or MD.  Also discussed weather considerations and indoor options.  Pt voiced understanding. Jeremy Dorsey is doing well in rehab.  He has started to add in some exericse at home.  He is not sure how fast he is going on his treadmill so we talked about using his heart rate and RPE to help gauge his intensity. Reviewed home exercise with pt today.  Pt plans to walk and use treadmill at home for exercise.  He also has a cross traininer that he can use at home. Reviewed THR, pulse, RPE, sign and symptoms, pulse oximetery and when to call  911 or MD.  Also discussed weather considerations and indoor options.  Pt voiced understanding.   Expected Outcomes Short: Use RPE daily to regulate intensity. Long: Follow program prescription in THR. Short: Review home exercise and expectations Long: Able to exercise independently at home with no complications Short: Increase workloads Long: Continue to improve stamina Short: Continue to add in exercise at home and start using heart rate to judge  Long: Continue to improve stamina. Short: Continue to add in exercise at home and start using heart rate to judge  Long:  Continue to improve stamina.   [place] Name 07/03/20 1252 07/11/20 0952 07/16/20 1247 07/29/20 1659 08/08/20 0958     Exercise Goal Re-Evaluation   Exercise Goals Review -- Increase Physical Activity;Increase Strength and Stamina;Understanding of Exercise Prescription Increase Physical Activity;Increase Strength and Stamina;Understanding of Exercise Prescription Increase Physical Activity;Increase Strength and Stamina Increase Physical Activity;Increase Strength and Stamina;Understanding of Exercise Prescription   Comments Jeremy Dorsey is progressing well and has increased resistance on XR.  He has moved up to 5 lb for strength work.  Staff will encourage increasing incline on TM and continued progression on seated machines. Jeremy Dorsey is doing well in rehab.  He is feeling better and able to do his yard work again and has been blowing leaves recently. His stamina is better.  He will get back to his treadmill once he is done with leaves. Jeremy Dorsey has increased TM during class to 2 and .5 incline.  He asked staff about a cramp in his calf that starts after 15 min on TM.  I had him stop and stretch and that helped.  He reports no swelling or redness in the area.  He did say his calves have always been very tight. Staff reviewed stretching for the calves. Jeremy Dorsey has only attended 4 times so far this month.  Consistent exercise would be beneficial for progress.  Staff will  monitor progress. Jeremy Dorsey is doing well in rehab.  He is feeling better overall.  He has been using his treadmill recently.  He hasn't been feeling as well this week.   Expected Outcomes Short:  continue to attend consistently and check HR on his own Long: increase MET level Short: Continue to exercise daily  Long: Get back to treadmill at home after leaf season Short: try to stretch calves daily Long:  walk without calf tightness Short: increase level on XR Long: improve TM walk and overall stamina Short: Continue to use treadmill daily Long: Continue to improve stamina.   [place] Name 08/12/20 1019 08/28/20 1406 09/05/20 0952 09/24/20 1615       Exercise Goal Re-Evaluation   Exercise Goals Review Increase Physical Activity;Increase Strength and Stamina -- Increase Physical Activity;Increase Strength and Stamina;Understanding of Exercise Prescription Increase Physical Activity;Increase Strength and Stamina;Understanding of Exercise Prescription    Comments Jeremy Dorsey will benefit from using his TM outside program sessions as he is only here 2 days per week.  We will monitor progress. Out with COVID since last review Jeremy Dorsey returned today after being out with COVID and work.  He has been staying busy when at work.  He is feeling stronger and has more stamina.  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  He is trying to figure out a plan for when he graduates.  Previously he was going to his wife's gym but his insurance no longer pays for it.  He does have a treadmill and crosstrainier at home that he is thinking about using. Jeremy Dorsey is doing great!  He is nearing graduation and improved his post walk by 375 ft!!  He is going to continue to exercise at home on his equipment.    Expected Outcomes Short:  get in Tm outside porgram sessions Long:  build overall stamina -- Short: Improve post 6MWT Long: Continue to exercise independently Continue to exercise independently           Discharge Exercise Prescription (Final  Exercise Prescription Changes):  Exercise Prescription Changes - 09/24/20 1600      Response to Exercise  Blood Pressure (Admit) 148/64    Blood Pressure (Exercise) 152/70    Blood Pressure (Exit) 142/72    Heart Rate (Admit) 81 bpm    Heart Rate (Exercise) 110 bpm    Heart Rate (Exit) 91 bpm    Rating of Perceived Exertion (Exercise) 16    Symptoms none    Duration Continue with 30 min of aerobic exercise without signs/symptoms of physical distress.    Intensity THRR unchanged      Progression   Progression Continue to progress workloads to maintain intensity without signs/symptoms of physical distress.    Average METs 2.32      Resistance Training   Training Prescription Yes    Weight 5 lb    Reps 10-15      Interval Training   Interval Training No      Treadmill   MPH 2    Grade 0.5    Minutes 15    METs 2.67      NuStep   Level 4    Minutes 15    METs 3.6      REL-XR   Level 3    Minutes 15    METs 1.4      Biostep-RELP   Level 1    Minutes 15    METs 2      Home Exercise Plan   Plans to continue exercise at Home (comment)   walking, treadmill, cross trainer   Frequency Add 3 additional days to program exercise sessions.    Initial Home Exercises Provided 06/18/20           Nutrition:  Target Goals: Understanding of nutrition guidelines, daily intake of sodium <1526m, cholesterol <2025m calories 30% from fat and 7% or less from saturated fats, daily to have 5 or more servings of fruits and vegetables.  Education: All About Nutrition: -Group instruction provided by verbal, written material, interactive activities, discussions, models, and posters to present general guidelines for heart healthy nutrition including fat, fiber, MyPlate, the role of sodium in heart healthy nutrition, utilization of the nutrition label, and utilization of this knowledge for meal planning. Follow up email sent as well. Written material given at graduation. Flowsheet Row  [facility]  Date 04/11/20  [facility]ruction Review Code 1- Verbalizes Understanding  [need identified]      Biometrics:  Pre Biometrics - 05/07/20 1044      Pre Biometrics   Height 5' 9.1" (1.755 m)    Weight 231 lb 6.4 oz (105 kg)    BMI (Calculated) 34.08    Single Leg Stand 5.2 seconds            Nutrition Therapy Plan and Nutrition Goals:  Nutrition Therapy & Goals - 06/04/20 0925      Nutrition Therapy   Diet Heart healthy, low Na, CKD stg 4 MNT    Drug/Food Interactions Statins/Certain Fruits    Protein (specify units) 85g    Fiber 30 grams    Whole Grain Foods 3 servings    Saturated Fats 12 max. grams    Fruits and Vegetables 8 servings/day    Sodium 1.5 grams      Personal Nutrition Goals   Nutrition Goal ST: try canned salmon and beans (bean salad) to switch up protein. Add walnuts to breakfast or boild egg ~2x/week to add protein.  LT: get a variety of vegetables with moderate potassium intake, get a variety of protein foods  Comments No salt since July, not bread at everymeal, no red meat for 4 months. He is lactose intolerant. B: Bowl of life cereal with almond milk with water L: chicken sandwich or [place] - whole wheat bread - sometimes no bread S: unsweet tea D: salads with sliced [place], soups (homemade chicken noodle - reduced salt, vegetable beef soup, clear broth clam "chowder"), pork chops 1x/month, wife doesn't like fish - will get when out to eat, chicken and [place] most of the time. Will have brown rice when he has grains. Vegetables: he loves green beans, corn, brussels sprouts, he doesn't like beets, okra, or asparagus. Uses extra virgin olive oil  or pam spray. He doesn't like canned tuna that he doesn't like. No medication for diabetes, BG is well controlled. 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 Discussed heart healthy and T2DM eating. He would like to get a wider variety of protein.       Intervention Plan   Intervention Prescribe, educate and counsel regarding individualized specific dietary modifications aiming towards targeted core components such as weight, hypertension, lipid management, diabetes, heart failure and other comorbidities.;Nutrition handout(s) given to patient.    Expected Outcomes Short Term Goal: Understand basic principles of dietary content, such as calories, fat, sodium, cholesterol and nutrients.;Short Term Goal: A plan has been developed with personal nutrition goals set during dietitian appointment.;Long Term Goal: Adherence to prescribed nutrition plan.           Nutrition Assessments:  Nutrition Assessments - 09/24/20 1004      MEDFICTS Scores   Pre Score 60    Post Score 6    Score Difference -54          MEDIFICTS Score Key:  ?70 Need to make dietary changes   40-70 Heart Healthy Diet  ? 40 Therapeutic Level Cholesterol Diet   Picture Your Plate Scores:  <26 Unhealthy dietary pattern with much room for improvement.  41-50 Dietary pattern unlikely to meet recommendations for good health and room for improvement.  51-60 More healthful dietary pattern, with some room for improvement.   >60 Healthy dietary pattern, although there may be some specific behaviors that could be improved.    Nutrition Goals Re-Evaluation:  Nutrition Goals Re-Evaluation    [place] Name 06/18/20 1006 07/11/20 0958 08/08/20 1004 09/05/20 0957       Goals   Nutrition Goal ST: try canned salmon and beans (bean salad) to switch up protein. Add walnuts to breakfast or boild egg ~2x/week to add protein.  LT: get a variety of vegetables with moderate potassium intake, get a variety of protein foods Short: Continue to to add vareity  Long: Continue to focus on healthier eating. Short: Continue to to add vareity  Long: Continue to focus on healthier eating. Short: Find some wheat bread alternatives to help  Long: Continue to eat healthier    Comment Jeremy Dorsey is doing  well with his diet.  He has not tried the bean salad and prefers fresh fish versus canned.  He did admit to hamburger in his spaghetti.  He prefers chicken to read meat.  He eats a lot of salds and tries for two vegetables at each meal. Jeremy Dorsey is doing well with his diet.  He feels that he is still retaining fluid but in now really watching his sodium intake.  He is washing his canned vegetables and no longer adding salt at the table.  He is eating a wide variety of food especially vegetables.  Jeremy Dorsey indulged some over the holidays, but is now ready to buckle down. He does miss bread, so we talked about finding a whole grain version.  He is really watching his sodium.  He has been doing better with variety of foods. Jeremy Dorsey has been getting away from bread and really watching his salt intake.  He does indulge in a cookie on occasion.  He is trying to get in more variety.    Expected Outcome Short: Continue to to add vareity  Long: Continue to focus on healthier eating. Short: Continue to watch sodium Long: Continue to eat healthy. Short: Find some wheat bread alternatives to help  Long: Continue to eat healthier Short: Continue to watch sodium Long; Continue to eat better.           Nutrition Goals Discharge (Final Nutrition Goals Re-Evaluation):  Nutrition Goals Re-Evaluation - 09/05/20 0957      Goals   Nutrition Goal Short: Find some wheat bread alternatives to help  Long: Continue to eat healthier    Comment Jeremy Dorsey has been getting away from bread and really watching his salt intake.  He does indulge in a cookie on occasion.  He is trying to get in more variety.    Expected Outcome Short: Continue to watch sodium Long; Continue to eat better.           Psychosocial: Target Goals: Acknowledge presence or absence of significant depression and/or stress, maximize coping skills, provide positive support system. Participant is able to verbalize types and ability to use techniques and skills needed for  reducing stress and depression.   Education: Stress, Anxiety, and Depression - Group verbal and visual presentation to define topics covered.  Reviews how body is impacted by stress, anxiety, and depression.  Also discusses healthy ways to reduce stress and to treat/manage anxiety and depression.  Written material given at graduation. Flowsheet Row Cardiac Rehab from 08/29/2020 in [facility] Cardiac and Pulmonary Rehab  Date 07/11/20  Educator SB  Instruction Review Code 1- [employer] Understanding      Education: Sleep Hygiene -Provides group verbal and written instruction about how sleep can affect your health.  Define sleep hygiene, discuss sleep cycles and impact of sleep habits. Review good sleep hygiene tips.    Initial Review & Psychosocial Screening:   Quality of Life Scores:   Quality of Life - 09/24/20 1006      Quality of Life   Select Quality of Life      Quality of Life Scores   Health/Function Pre 14.43 %    Health/Function Post 22.47 %    Health/Function % Change 55.72 %    Socioeconomic Pre 23.07 %    Socioeconomic Post 23.81 %    Socioeconomic % Change  3.21 %    Psych/Spiritual Pre 19.43 %    Psych/Spiritual Post 19.71 %    Psych/Spiritual % Change 1.44 %    Family Pre 25.9 %    Family Post 26.4 %    Family % Change 1.93 %    GLOBAL Pre 28.93 %    GLOBAL Post 22.79 %    GLOBAL % Change -21.22 %          Scores of 19 and below usually indicate a poorer quality of life in these areas.  A difference of  2-3 points is a clinically meaningful difference.  A difference of 2-3 points in the total score of the Quality of Life Index has been associated with significant improvement in overall  quality of life, self-image, physical symptoms, and general health in studies assessing change in quality of life.  PHQ-9: Recent Review Flowsheet Data    Depression screen [facility] 2/9 09/24/2020 05/07/2020 03/26/2020 05/24/2019   Decreased Interest 0 0 1 0   Down, Depressed, Hopeless  0 0 1 0   PHQ - 2 Score 0 0 2 0   Altered sleeping _0 -   Tired, decreased energy 0 1 2 -   Change in appetite 0 1 3 -   Feeling bad or failure about yourself  0 0 0 -   Trouble concentrating 0 0 0 -   Moving slowly or fidgety/restless 0 0 0 -   Suicidal thoughts 0 0 0 -   PHQ-9 Score _1 -   Difficult doing work/chores Not difficult at all Not difficult at all Not difficult at all -     Interpretation of Total Score  Total Score Depression Severity:  1-4 = Minimal depression, 5-9 = Mild depression, 10-14 = Moderate depression, 15-19 = Moderately severe depression, 20-27 = Severe depression   Psychosocial Evaluation and Intervention:   Psychosocial Re-Evaluation:  Psychosocial Re-Evaluation    Row Name 05/30/20 1016 06/18/20 1001 07/11/20 0955 08/08/20 1001 09/05/20 0955     Psychosocial Re-Evaluation   Current issues with Current Sleep Concerns Current Sleep Concerns;Current Stress Concerns Current Sleep Concerns;Current Stress Concerns Current Sleep Concerns;Current Stress Concerns Current Sleep Concerns;Current Stress Concerns   Comments Jeremy Dorsey is doing well mentally. He stated he has not slept well since a while ago since he is up a lot to go to the bathroom in the middle of the night. He has already talked to his doctor about it and has declined taking any sleep-aid or medications to help with sleep. Denied big stressors or anxiety related events. He has great support from his wife. He is feeling much better since he has his stent placed as he rarely feels chest pain. Therefore, he feels he can go out and do more which he is happy about. Jeremy Dorsey is doing well in rehab.  He is doing well mentally.  He takes it one day at a time and deals with things as they come.  He tries not to worry about anything and just does what he can.  He would love uninterrupted sleep, but his fluid keeps him getting up at night to go to bathroom.  He is able to get back to sleep afterwards pretty easily.   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  But he took control and asked for them to refrain from bad behavior and language. Jeremy Dorsey is doing well mentally.  He continues to stay positive.  His bathroom trips have continued to keep him up at night, but they are getting better.  He is up to 3-3.5 hours before getting up. He is going to vien specialist tomorrow to get worked up for fistula.  He is not excited about all this. Jeremy Dorsey is doing well in rehab.  He continues to focus on the positive.  He has not felt well that past couple of days.  His sleep is staying about the same.  He does use tylenol PM on occassion, but we talked about using melatonin instead.  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 Jeremy Dorsey was  out sick with COVID for a while, then got called into to work, so he is back after missing a few weeks.  He tries not to let things get to him too much.  His work schedule keeps him busy.  Sleep continues to do okay.  He did try the melatonin but still only got about 3 hrs of sleep.  He was able to get back to sleep.  His sleep issues have been ongoing since he was in his thirties.   Expected Outcomes Short: Continue attending rehab consistently Long: Utilize exercise for stress management and maintain positive attitude Short: Continue to sleep as best as possible Long; Continue to stay positive. Short: Get through appointment tomorrow.  Long: Continue to stay positive Short: Try melatonin for sleep Long: Continue to stay positive. Short: Continue to work on sleep Long; Continue to focus positive.   Interventions Encouraged to attend Cardiac Rehabilitation for the exercise Encouraged to attend Cardiac Rehabilitation for the exercise Encouraged to attend Cardiac Rehabilitation for the exercise Encouraged to attend Cardiac Rehabilitation for the exercise Encouraged to attend  Cardiac Rehabilitation for the exercise   Continue Psychosocial Services  Follow up required by staff Follow up required by staff Follow up required by staff Follow up required by staff Follow up required by staff          Psychosocial Discharge (Final Psychosocial Re-Evaluation):  Psychosocial Re-Evaluation - 09/05/20 0955      Psychosocial Re-Evaluation   Current issues with Current Sleep Concerns;Current Stress Concerns    Comments Jeremy Dorsey was out sick with COVID for a while, then got called into to work, so he is back after missing a few weeks.  He tries not to let things get to him too much.  His work schedule keeps him busy.  Sleep continues to do okay.  He did try the melatonin but still only got about 3 hrs of sleep.  He was able to get back to sleep.  His sleep issues have been ongoing since he was in his thirties.    Expected Outcomes Short: Continue to work on sleep Long; Continue to focus positive.    Interventions Encouraged to attend Cardiac Rehabilitation for the exercise    Continue Psychosocial Services  Follow up required by staff           Vocational Rehabilitation: Provide vocational rehab assistance to qualifying candidates.   Vocational Rehab Evaluation & Intervention:   Education: Education Goals: Education classes will be provided on a variety of topics geared toward better understanding of heart health and risk factor modification. Participant will state understanding/return demonstration of topics presented as noted by education test scores.  Learning Barriers/Preferences:   General Cardiac Education Topics:  AED/CPR: - Group verbal and written instruction with the use of models to demonstrate the basic use of the AED with the basic ABC's of resuscitation.   Anatomy and Cardiac Procedures: - Group verbal and visual presentation and models provide information about basic cardiac anatomy and function. Reviews the testing methods done to diagnose heart  disease and the outcomes of the test results. Describes the treatment choices: Medical Management, Angioplasty, or Coronary Bypass Surgery for treating various heart conditions including Myocardial Infarction, Angina, Valve Disease, and Cardiac Arrhythmias.  Written material given at graduation. Flowsheet Row Cardiac Rehab from 08/29/2020 in [facility] Cardiac and Pulmonary Rehab  Date 03/26/20  Instruction Review Code 3- Needs Reinforcement  [need identified]      Medication Safety: - Group verbal and visual instruction  to review commonly prescribed medications for heart and lung disease. Reviews the medication, class of the drug, and side effects. Includes the steps to properly store meds and maintain the prescription regimen.  Written material given at graduation.   Intimacy: - Group verbal instruction through game format to discuss how heart and lung disease can affect sexual intimacy. Written material given at graduation.. Flowsheet Row Cardiac Rehab from 08/29/2020 in [facility] Cardiac and Pulmonary Rehab  Date 05/23/20  Educator [facility]  Instruction Review Code 1- Verbalizes Understanding      Know Your Numbers and Heart Failure: - Group verbal and visual instruction to discuss disease risk factors for cardiac and pulmonary disease and treatment options.  Reviews associated critical values for Overweight/Obesity, Hypertension, Cholesterol, and Diabetes.  Discusses basics of heart failure: signs/symptoms and treatments.  Introduces Heart Failure Zone chart for action plan for heart failure.  Written material given at graduation. Flowsheet Row Cardiac Rehab from 08/29/2020 in [facility] Cardiac and Pulmonary Rehab  Date 06/25/20  Educator [facility]  Instruction Review Code 1- Verbalizes Understanding      Infection Prevention: - Provides verbal and written material to individual with discussion of infection control including proper hand washing and proper equipment cleaning during exercise session. Flowsheet  Row Cardiac Rehab from 08/29/2020 in [facility] Cardiac and Pulmonary Rehab  Date 03/21/20  Educator [facility]  Instruction Review Code 1- Verbalizes Understanding      Falls Prevention: - Provides verbal and written material to individual with discussion of falls prevention and safety. Flowsheet Row Cardiac Rehab from 08/29/2020 in [facility] Cardiac and Pulmonary Rehab  Date 03/21/20  Educator [facility]  Instruction Review Code 1- Verbalizes Understanding      Other: -Provides group and verbal instruction on various topics (see comments)   Knowledge Questionnaire Score:  Knowledge Questionnaire Score - 09/24/20 1004      Knowledge Questionnaire Score   Post Score 24/26           Core Components/Risk Factors/Patient Goals at Admission:   Education:Diabetes - Individual verbal and written instruction to review signs/symptoms of diabetes, desired ranges of glucose level fasting, after meals and with exercise. Acknowledge that pre and post exercise glucose checks will be done for 3 sessions at entry of program. [place] from 08/29/2020 in [facility] Cardiac and Pulmonary Rehab  Date 03/21/20  Educator [facility]  Instruction Review Code 1- Verbalizes Understanding      Core Components/Risk Factors/Patient Goals Review:   Goals and Risk Factor Review    Row Name 05/30/20 1010 06/18/20 1003 07/11/20 1002 08/08/20 1007 09/05/20 1000     Core Components/Risk Factors/Patient Goals Review   Personal Goals Review Weight Management/Obesity;Hypertension;Lipids;Diabetes Weight Management/Obesity;Hypertension;Diabetes Weight Management/Obesity;Hypertension;Diabetes Weight Management/Obesity;Hypertension;Diabetes Weight Management/Obesity;Hypertension;Diabetes   Review [place] takes his blood sugars at home once per day which have been stable. He also takes BP at home,  and reports 130  or lower for systolic below 80 for diastolic. BPs at rehab have also been in appropriate range.  He weighs himself every  morning- stable with weight but wants to lose weight as one of his main goals. He same given up white bread and has consumed less simple carbs. He has a meeting with the RD soon to talk more about weight loss and establish nutrition goals.H feels his pants are big and shirt feels looser. Jeremy Dorsey is doing well in rehab.  His weight is staying steady. He would like to lose more, reviewed exercise today so hopefully adding some in will be  helpful.  His left calf pain has resolved but his right calf still has a slight knot.  He is feeling a lot better.  His blood pressures have been doing well and staying below 150s.  He has also gotten away from using salt.  His sugars are doing well and he reports they are good. His A1c is down to 7.3!!  He is pleased with his progress.  He is doing well with his medications overall.The [place] is doing better now at the lower dose. Jeremy Dorsey is doing well in rehab. His weight continues to fluctuate up and down.  At home he is around 227 lb and continues to trend down overall. His blood pressures have been good in class and at home.  His sugars continue to do well too. Jeremy Dorsey is doing well in rehab.  His weight was up over the holidays but starting to come back down again.  He wants to get rid of his holiday weight.  His sugars were up some but getting back to better control and generally staying below 150s overall.  His blood pressures have been slightly elevated in class this week, but have been good at home.  He usually stays below 130s at home.  He has not had his meds yet today. Jeremy Dorsey is doing well in rehab and nearing graduation.  His weight is coming back down from the holidays. Jeremy Dorsey is doing well with his blood pressures.  His sugars are doing better and he continues to keep a close eye on it.   Expected Outcomes Short: Meet with RD to discuss weight loss goals Long: Continue to manage lifestyle risk factors Short: Continue to work on weight loss long; Continue to monitor risk factors.  Short: Continue to work on weight loss long; Continue to monitor risk factors. Short: Continue to work weight loss Long: Continue to monitor risk factors. Short: Continue to work on weight loss Long: Continue to monitor risk factors          Core Components/Risk Factors/Patient Goals at Discharge (Final Review):   Goals and Risk Factor Review - 09/05/20 1000      Core Components/Risk Factors/Patient Goals Review   Personal Goals Review Weight Management/Obesity;Hypertension;Diabetes    Review Jeremy Dorsey is doing well in rehab and nearing graduation.  His weight is coming back down from the holidays. Jeremy Dorsey is doing well with his blood pressures.  His sugars are doing better and he continues to keep a close eye on it.    Expected Outcomes Short: Continue to work on weight loss Long: Continue to monitor risk factors           ITP Comments:  ITP Comments    Row Name 05/07/20 1040 05/08/20 0608 05/14/20 1148 05/14/20 1150 06/05/20 0608   ITP Comments Completed 6MWT and gym orientation. Initial ITP created and sent for review to Dr. Emily Filbert, Medical Director.  Restarting with new stents and MD. 30 Day review completed. Medical Director ITP review done, changes made as directed, and signed approval by Medical Director. First full day of exercise!  Patient was oriented to gym and equipment including functions, settings, policies, and procedures.  Patient's individual exercise prescription and treatment plan were reviewed.  All starting workloads were established based on the results of the 6 minute walk test done at initial orientation visit.  The plan for exercise progression was also introduced and progression will be customized based on patient's performance and goals. Jeremy Dorsey is returning after new stent placed for persistent  angina. 30 Day review completed. Medical Director ITP review done, changes made as directed, and signed approval by Medical Director.   [place] Name 07/03/20 [phone] 07/31/20 0521 08/28/20  0845 08/28/20 1406 09/25/20 0607   ITP Comments 30 Day review completed. Medical Director ITP review done, changes made as directed, and signed approval by Medical Director. 30 Day review completed. Medical Director ITP review done, changes made as directed, and signed approval by Medical Director. 30 Day review completed. Medical Director ITP review done, changes made as directed, and signed approval by Medical Director. Out since 08/09/19 with COVID 30 Day review completed. Medical Director ITP review done, changes made as directed, and signed approval by Medical Director.          Comments:

## 2020-10-01 ENCOUNTER — Other Ambulatory Visit: Payer: Self-pay

## 2020-10-01 ENCOUNTER — Encounter: Payer: PPO | Attending: Cardiovascular Disease | Admitting: *Deleted

## 2020-10-01 DIAGNOSIS — Z955 Presence of coronary angioplasty implant and graft: Secondary | ICD-10-CM | POA: Diagnosis not present

## 2020-10-01 DIAGNOSIS — I214 Non-ST elevation (NSTEMI) myocardial infarction: Secondary | ICD-10-CM | POA: Insufficient documentation

## 2020-10-01 NOTE — Progress Notes (Signed)
Daily Session Note  Patient Details  Name: Jeremy Dorsey MRN: [id number] Date of Birth: [birth year] Referring Provider:   Flowsheet Row Cardiac Rehab from 05/07/2020 in [facility] Cardiac and Pulmonary Rehab  Referring Provider [name] MD      Encounter Date: 10/01/2020  Check In:  Session Check In - 10/01/20 1008      Check-In   Supervising physician immediately available to respond to emergencies See telemetry face sheet for immediately available ER MD    Location [facility]-Cardiac & Pulmonary Rehab    Staff Present Heath Lark, RN, BSN, CCRP;Amanda Sommer, BA, ACSM CEP, Exercise Physiologist;Kara Eliezer Bottom, MS Exercise Physiologist    Virtual Visit No    Medication changes reported     No    Fall or balance concerns reported    No    Warm-up and Cool-down Performed on first and last piece of equipment    Resistance Training Performed Yes    VAD Patient? No    PAD/SET Patient? No      Pain Assessment   Currently in Pain? No/denies              Social History   Tobacco Use  Smoking Status Former Smoker  . Packs/day: 1.00  . Years: 30.00  . Pack years: 30.00  . Types: Cigarettes  . Quit date: 08/23/2007  . Years since quitting: 13.1  Smokeless Tobacco Never Used    Goals Met:  Independence with exercise equipment Exercise tolerated well No report of cardiac concerns or symptoms  Goals Unmet:  Not Applicable  Comments: Pt able to follow exercise prescription today without complaint.  Will continue to monitor for progression.    Dr. Emily Filbert is Medical Director for [place] and [name].

## 2020-10-03 ENCOUNTER — Other Ambulatory Visit: Payer: Self-pay

## 2020-10-03 DIAGNOSIS — I214 Non-ST elevation (NSTEMI) myocardial infarction: Secondary | ICD-10-CM

## 2020-10-03 DIAGNOSIS — Z955 Presence of coronary angioplasty implant and graft: Secondary | ICD-10-CM | POA: Diagnosis not present

## 2020-10-03 NOTE — Progress Notes (Signed)
Daily Session Note  Patient Details  Name: Jeremy Dorsey MRN: [id number] Date of Birth: Jun 16, 1951 Referring Provider:   Flowsheet Row Cardiac Rehab from 05/07/2020 in [facility] Cardiac [facility]  Referring Provider [name] MD      Encounter Date: 10/03/2020  Check In:  Session Check In - 10/03/20 1007      Check-In   Supervising physician immediately available to respond to emergencies See telemetry face sheet for immediately available ER MD    Location [facility]-Cardiac & Pulmonary Rehab    Staff Present Birdie Sons, MPA, RN;Melissa Caiola RDN, LDN;Jessica Luan Pulling, MA, RCEP, CCRP, CCET    Virtual Visit No    Medication changes reported     No    Fall or balance concerns reported    No    Tobacco Cessation No Change    Warm-up and Cool-down Performed on first and last piece of equipment    Resistance Training Performed Yes    VAD Patient? No    PAD/SET Patient? No      Pain Assessment   Currently in Pain? No/denies              Social History   Tobacco Use  Smoking Status Former Smoker  . Packs/day: 1.00  . Years: 30.00  . Pack years: 30.00  . Types: Cigarettes  . Quit date: 08/23/2007  . Years since quitting: 13.1  Smokeless Tobacco Never Used    Goals Met:  Independence with exercise equipment Exercise tolerated well No report of cardiac concerns or symptoms Strength training completed today  Goals Unmet:  Not Applicable  Comments:  Zakarie graduated today from  rehab with 36 sessions completed.  Details of the patient's exercise prescription and what He needs to do in order to continue the prescription and progress were discussed with patient.  Patient was given a copy of prescription and goals.  Patient verbalized understanding.  Kelby plans to continue to exercise by working out at home.    Dr. Emily Filbert is Medical Director for [place] and [name].

## 2020-10-03 NOTE — Patient Instructions (Signed)
Discharge Patient Instructions  Patient Details  Name: Jeremy Dorsey MRN: [id number] Date of Birth: [birth year] Referring Provider:  Minna Merritts, MD   Number of Visits: 75  Reason for Discharge:  Patient reached a stable level of exercise. Patient independent in their exercise. Patient has met program and personal goals.  Smoking History:  Social History   Tobacco Use  Smoking Status Former Smoker  . Packs/day: 1.00  . Years: 30.00  . Pack years: 30.00  . Types: Cigarettes  . Quit date: 08/23/2007  . Years since quitting: 13.1  Smokeless Tobacco Never Used    Diagnosis:  Status post coronary artery stent placement  Initial Exercise Prescription:  Initial Exercise Prescription - 05/07/20 1000      Date of Initial Exercise RX and Referring Provider   Date 05/07/20    Referring Provider [name] MD      Treadmill   MPH 1.7    Grade 0    Minutes 15    METs 2.3      Recumbant Bike   Level 1    RPM 50    Watts 5    Minutes 15    METs 2      NuStep   Level 1    SPM 80    Minutes 15    METs 2      REL-XR   Level 1    Speed 50    Minutes 15    METs 2      Prescription Details   Frequency (times per week) 2    Duration Progress to 30 minutes of continuous aerobic without signs/symptoms of physical distress      Intensity   THRR 40-80% of Max Heartrate 104-135    Ratings of Perceived Exertion 11-13    Perceived Dyspnea 0-4      Progression   Progression Continue to progress workloads to maintain intensity without signs/symptoms of physical distress.      Resistance Training   Training Prescription Yes    Weight 3 lb    Reps 10-15           Discharge Exercise Prescription (Final Exercise Prescription Changes):  Exercise Prescription Changes - 09/24/20 1600      Response to Exercise   Blood Pressure (Admit) 148/64    Blood Pressure (Exercise) 152/70    Blood Pressure (Exit) 142/72    Heart Rate (Admit) 81 bpm    Heart Rate  (Exercise) 110 bpm    Heart Rate (Exit) 91 bpm    Rating of Perceived Exertion (Exercise) 16    Symptoms none    Duration Continue with 30 min of aerobic exercise without signs/symptoms of physical distress.    Intensity THRR unchanged      Progression   Progression Continue to progress workloads to maintain intensity without signs/symptoms of physical distress.    Average METs 2.32      Resistance Training   Training Prescription Yes    Weight 5 lb    Reps 10-15      Interval Training   Interval Training No      Treadmill   MPH 2    Grade 0.5    Minutes 15    METs 2.67      NuStep   Level 4    Minutes 15    METs 3.6      REL-XR   Level 3    Minutes 15    METs 1.4  Biostep-RELP   Level 1    Minutes 15    METs 2      Home Exercise Plan   Plans to continue exercise at Home (comment)   walking, treadmill, cross trainer   Frequency Add 3 additional days to program exercise sessions.    Initial Home Exercises Provided 06/18/20           Functional Capacity:  6 Minute Walk    Row Name 05/07/20 1040 09/12/20 1157       6 Minute Walk   Phase Initial Discharge    Distance 925 feet 1300 feet    Distance % Change -- 40.5 %    Distance Feet Change -- 375 ft    Walk Time 6 minutes 6 minutes    # of Rest Breaks 0 0    MPH 1.75 2.46    METS 1.87 2.98    RPE 13 15    Perceived Dyspnea  -- 0    VO2 Peak 6.5 10.43    Symptoms Yes (comment) Yes (comment)    Comments pain in calves 9/10, legs fatigued calf pain 7/10    Resting HR 73 bpm 67 bpm    Resting BP 128/60 148/78    Resting Oxygen Saturation  96 % --    Exercise Oxygen Saturation  during 6 min walk 99 % --    Max Ex. HR 99 bpm 113 bpm    Max Ex. BP 132/70 172/72    2 Minute Post BP 122/62 --           Quality of Life:  Quality of Life - 09/24/20 1006      Quality of Life   Select Quality of Life      Quality of Life Scores   Health/Function Pre 14.43 %    Health/Function Post 22.47 %     Health/Function % Change 55.72 %    Socioeconomic Pre 23.07 %    Socioeconomic Post 23.81 %    Socioeconomic % Change  3.21 %    Psych/Spiritual Pre 19.43 %    Psych/Spiritual Post 19.71 %    Psych/Spiritual % Change 1.44 %    Family Pre 25.9 %    Family Post 26.4 %    Family % Change 1.93 %    GLOBAL Pre 28.93 %    GLOBAL Post 22.79 %    GLOBAL % Change -21.22 %           Personal Goals: Goals established at orientation with interventions provided to work toward goal.    Personal Goals Discharge:  Goals and Risk Factor Review - 09/05/20 1000      Core Components/Risk Factors/Patient Goals Review   Personal Goals Review Weight Management/Obesity;Hypertension;Diabetes    Review Jeremy Dorsey is doing well in rehab and nearing graduation.  His weight is coming back down from the holidays. Jeremy Dorsey is doing well with his blood pressures.  His sugars are doing better and he continues to keep a close eye on it.    Expected Outcomes Short: Continue to work on weight loss Long: Continue to monitor risk factors           Exercise Goals and Review:   Exercise Goals Re-Evaluation:  Exercise Goals Re-Evaluation    Row Name 05/14/20 1148 05/30/20 1006 06/05/20 1413 06/18/20 1028 06/19/20 1258     Exercise Goal Re-Evaluation   Exercise Goals Review Able to understand and use rate of perceived exertion (RPE) scale;Knowledge and understanding of Target  Heart Rate Range (THRR);Understanding of Exercise Prescription Able to understand and use rate of perceived exertion (RPE) scale;Knowledge and understanding of Target Heart Rate Range (THRR);Understanding of Exercise Prescription Increase Physical Activity;Increase Strength and Stamina;Understanding of Exercise Prescription Increase Physical Activity;Increase Strength and Stamina;Understanding of Exercise Prescription Increase Physical Activity;Increase Strength and Stamina;Understanding of Exercise Prescription   Comments Reviewed RPE and dyspnea  scales, THR and program prescription with pt today.  Pt voiced understanding and was given a copy of goals to take home. Jeremy Dorsey does not do any exercise at home as of yet. He recent;y came back to rehab after he had a stent placed and feels much better, he rarely experiences chest pain and feels confident to start exercise at home. He has 3 different machines he can chose from. EP will review home exercise in the next couple of weeks. Jeremy Dorsey is doing well in rehab.  He is now up to 1.7 METs on the recumbent elliptical and up to 4lb hand weights.  He should be ready to start to increase his workloads. Jeremy Dorsey is doing well in rehab.  He has started to add in some exericse at home.  He is not sure how fast he is going on his treadmill so we talked about using his heart rate and RPE to help gauge his intensity. Reviewed home exercise with pt today.  Pt plans to walk and use treadmill at home for exercise.  He also has a cross traininer that he can use at home. Reviewed THR, pulse, RPE, sign and symptoms, pulse oximetery and when to call 911 or MD.  Also discussed weather considerations and indoor options.  Pt voiced understanding. Jeremy Dorsey is doing well in rehab.  He has started to add in some exericse at home.  He is not sure how fast he is going on his treadmill so we talked about using his heart rate and RPE to help gauge his intensity. Reviewed home exercise with pt today.  Pt plans to walk and use treadmill at home for exercise.  He also has a cross traininer that he can use at home. Reviewed THR, pulse, RPE, sign and symptoms, pulse oximetery and when to call 911 or MD.  Also discussed weather considerations and indoor options.  Pt voiced understanding.   Expected Outcomes Short: Use RPE daily to regulate intensity. Long: Follow program prescription in THR. Short: Review home exercise and expectations Long: Able to exercise independently at home with no complications Short: Increase workloads Long: Continue to improve  stamina Short: Continue to add in exercise at home and start using heart rate to judge  Long: Continue to improve stamina. Short: Continue to add in exercise at home and start using heart rate to judge  Long: Continue to improve stamina.   [place] Name 07/03/20 1252 07/11/20 0952 07/16/20 1247 07/29/20 1659 08/08/20 0958     Exercise Goal Re-Evaluation   Exercise Goals Review -- Increase Physical Activity;Increase Strength and Stamina;Understanding of Exercise Prescription Increase Physical Activity;Increase Strength and Stamina;Understanding of Exercise Prescription Increase Physical Activity;Increase Strength and Stamina Increase Physical Activity;Increase Strength and Stamina;Understanding of Exercise Prescription   Comments Jeremy Dorsey is progressing well and has increased resistance on XR.  He has moved up to 5 lb for strength work.  Staff will encourage increasing incline on TM and continued progression on seated machines. Jeremy Dorsey is doing well in rehab.  He is feeling better and able to do his yard work again and has been blowing leaves recently. His stamina is better.  He will get back to his treadmill once he is done with leaves. Jeremy Dorsey has increased TM during class to 2 and .5 incline.  He asked staff about a cramp in his calf that starts after 15 min on TM.  I had him stop and stretch and that helped.  He reports no swelling or redness in the area.  He did say his calves have always been very tight. Staff reviewed stretching for the calves. Jeremy Dorsey has only attended 4 times so far this month.  Consistent exercise would be beneficial for progress.  Staff will monitor progress. Jeremy Dorsey is doing well in rehab.  He is feeling better overall.  He has been using his treadmill recently.  He hasn't been feeling as well this week.   Expected Outcomes Short:  continue to attend consistently and check HR on his own Long: increase MET level Short: Continue to exercise daily  Long: Get back to treadmill at home after leaf season  Short: try to stretch calves daily Long:  walk without calf tightness Short: increase level on XR Long: improve TM walk and overall stamina Short: Continue to use treadmill daily Long: Continue to improve stamina.   [place] Name 08/12/20 1019 08/28/20 1406 09/05/20 0952 09/24/20 1615       Exercise Goal Re-Evaluation   Exercise Goals Review Increase Physical Activity;Increase Strength and Stamina -- Increase Physical Activity;Increase Strength and Stamina;Understanding of Exercise Prescription Increase Physical Activity;Increase Strength and Stamina;Understanding of Exercise Prescription    Comments Jeremy Dorsey will benefit from using his TM outside program sessions as he is only here 2 days per week.  We will monitor progress. Out with COVID since last review Doug returned today after being out with COVID and work.  He has been staying busy when at work.  He is feeling stronger and has more stamina.  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  He is trying to figure out a plan for when he graduates.  Previously he was going to his wife's gym but his insurance no longer pays for it.  He does have a treadmill and crosstrainier at home that he is thinking about using. Jeremy Dorsey is doing great!  He is nearing graduation and improved his post walk by 375 ft!!  He is going to continue to exercise at home on his equipment.    Expected Outcomes Short:  get in Tm outside porgram sessions Long:  build overall stamina -- Short: Improve post 6MWT Long: Continue to exercise independently Continue to exercise independently           Nutrition & Weight - Outcomes:  Pre Biometrics - 05/07/20 1044      Pre Biometrics   Height 5' 9.1" (1.755 m)    Weight 231 lb 6.4 oz (105 kg)    BMI (Calculated) 34.08    Single Leg Stand 5.2 seconds            Nutrition:  Nutrition Therapy & Goals - 06/04/20 0925      Nutrition Therapy   Diet Heart healthy, low Na, CKD stg 4 MNT    Drug/Food Interactions Statins/Certain Fruits     Protein (specify units) 85g    Fiber 30 grams    Whole Grain Foods 3 servings    Saturated Fats 12 max. grams    Fruits and Vegetables 8 servings/day    Sodium 1.5 grams      Personal Nutrition Goals   Nutrition Goal ST: try canned salmon and beans (  bean salad) to switch up protein. Add walnuts to breakfast or boild egg ~2x/week to add protein.  LT: get a variety of vegetables with moderate potassium intake, get a variety of protein foods    Comments No salt since July, not bread at everymeal, no red meat for 4 months. He is lactose intolerant. B: Bowl of life cereal with almond milk with water L: chicken sandwich or [place] - whole wheat bread - sometimes no bread S: unsweet tea D: salads with sliced [place], soups (homemade chicken noodle - reduced salt, vegetable beef soup, clear broth clam "chowder"), pork chops 1x/month, wife doesn't like fish - will get when out to eat, chicken and [place] most of the time. Will have brown rice when he has grains. Vegetables: he loves green beans, corn, brussels sprouts, he doesn't like beets, okra, or asparagus. Uses extra virgin olive oil  or pam spray. He doesn't like canned tuna that he doesn't like. No medication for diabetes, BG is well controlled. 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 Discussed heart healthy and T2DM eating. He would like to get a wider variety of protein.      Intervention Plan   Intervention Prescribe, educate and counsel regarding individualized specific dietary modifications aiming towards targeted core components such as weight, hypertension, lipid management, diabetes, heart failure and other comorbidities.;Nutrition handout(s) given to patient.    Expected Outcomes Short Term Goal: Understand basic principles of dietary content, such as calories, fat, sodium, cholesterol and nutrients.;Short Term Goal: A plan has been developed with personal nutrition goals set during dietitian appointment.;Long Term Goal: Adherence to prescribed  nutrition plan.           Nutrition Discharge:  Nutrition Assessments - 09/24/20 1004      MEDFICTS Scores   Pre Score 60    Post Score 6    Score Difference -54           Education Questionnaire Score:  Knowledge Questionnaire Score - 09/24/20 1004      Knowledge Questionnaire Score   Post Score 24/26           Goals reviewed with patient; copy given to patient.

## 2020-10-03 NOTE — Progress Notes (Signed)
Discharge Progress Report  Patient Details  Name: Jeremy Dorsey MRN: [id number] Date of Birth: 1951/06/06 Referring Provider:   Flowsheet Row Cardiac Rehab from 05/07/2020 in [facility] Cardiac [facility] Pulmonary Re[facility]  Referring Provider [name] MD       Number of Visits: 36  Reason for Discharge:  Patient reached a stable level of exercise. Patient independent in their exercise. Patient has met program and personal goals.  Smoking History:  Social History   Tobacco Use  Smoking Status Former Smoker  . Packs/day: 1.00  . Years: 30.00  . Pack years: 30.00  . Types: Cigarettes  . Quit date: 08/23/2007  . Years since quitting: 13.1  Smokeless Tobacco Never Used    Diagnosis:  Status post coronary artery stent placement  NSTEMI (non-ST elevated myocardial infarction) (HCC)  Acute non-Q wave non-ST elevation myocardial infarction (NSTEMI) ([place])  ADL [facility]:   Initial Exercise Prescription:  Initial Exercise Prescription - 05/07/20 1000      Date of Initial Exercise RX and Referring Provider   Date 05/07/20    Referring Provider [name] MD      Treadmill   MPH 1.7    Grade 0    Minutes 15    METs 2.3      Recumbant Bike   Level 1    RPM 50    Watts 5    Minutes 15    METs 2      NuStep   Level 1    SPM 80    Minutes 15    METs 2      REL-XR   Level 1    Speed 50    Minutes 15    METs 2      Prescription Details   Frequency (times per week) 2    Duration Progress to 30 minutes of continuous aerobic without signs/symptoms of physical distress      Intensity   THRR 40-80% of Max Heartrate 104-135    Ratings of Perceived Exertion 11-13    Perceived Dyspnea 0-4      Progression   Progression Continue to progress workloads to maintain intensity without signs/symptoms of physical distress.      Resistance Training   Training Prescription Yes    Weight 3 lb    Reps 10-15           Discharge Exercise Prescription (Final Exercise  Prescription Changes):  Exercise Prescription Changes - 09/24/20 1600      Response to Exercise   Blood Pressure (Admit) 148/64    Blood Pressure (Exercise) 152/70    Blood Pressure (Exit) 142/72    Heart Rate (Admit) 81 bpm    Heart Rate (Exercise) 110 bpm    Heart Rate (Exit) 91 bpm    Rating of Perceived Exertion (Exercise) 16    Symptoms none    Duration Continue with 30 min of aerobic exercise without signs/symptoms of physical distress.    Intensity THRR unchanged      Progression   Progression Continue to progress workloads to maintain intensity without signs/symptoms of physical distress.    Average METs 2.32      Resistance Training   Training Prescription Yes    Weight 5 lb    Reps 10-15      Interval Training   Interval Training No      Treadmill   MPH 2    Grade 0.5    Minutes 15    METs 2.67  NuStep   Level 4    Minutes 15    METs 3.6      REL-XR   Level 3    Minutes 15    METs 1.4      Biostep-RELP   Level 1    Minutes 15    METs 2      Home Exercise Plan   Plans to continue exercise at Home (comment)   walking, treadmill, cross trainer   Frequency Add 3 additional days to program exercise sessions.    Initial Home Exercises Provided 06/18/20           Functional Capacity:  6 Minute Walk    Row Name 05/07/20 1040 09/12/20 1157       6 Minute Walk   Phase Initial Discharge    Distance 925 feet 1300 feet    Distance % Change -- 40.5 %    Distance Feet Change -- 375 ft    Walk Time 6 minutes 6 minutes    # of Rest Breaks 0 0    MPH 1.75 2.46    METS 1.87 2.98    RPE 13 15    Perceived Dyspnea  -- 0    VO2 Peak 6.5 10.43    Symptoms Yes (comment) Yes (comment)    Comments pain in calves 9/10, legs fatigued calf pain 7/10    Resting HR 73 bpm 67 bpm    Resting BP 128/60 148/78    Resting Oxygen Saturation  96 % --    Exercise Oxygen Saturation  during 6 min walk 99 % --    Max Ex. HR 99 bpm 113 bpm    Max Ex. BP 132/70  172/72    2 Minute Post BP 122/62 --           Psychological, QOL, Others - Outcomes: PHQ 2/9: Depression screen [facility] 2/9 09/24/2020 05/07/2020 03/26/2020 05/24/2019  Decreased Interest 0 0 1 0  Down, Depressed, Hopeless 0 0 1 0  PHQ - 2 Score 0 0 2 0  Altered sleeping _0 -  Tired, decreased energy 0 1 2 -  Change in appetite 0 1 3 -  Feeling bad or failure about yourself  0 0 0 -  Trouble concentrating 0 0 0 -  Moving slowly or fidgety/restless 0 0 0 -  Suicidal thoughts 0 0 0 -  PHQ-9 Score _1 -  Difficult doing work/chores Not difficult at all Not difficult at all Not difficult at all -    Quality of Life:  Quality of Life - 09/24/20 1006      Quality of Life   Select Quality of Life      Quality of Life Scores   Health/Function Pre 14.43 %    Health/Function Post 22.47 %    Health/Function % Change 55.72 %    Socioeconomic Pre 23.07 %    Socioeconomic Post 23.81 %    Socioeconomic % Change  3.21 %    Psych/Spiritual Pre 19.43 %    Psych/Spiritual Post 19.71 %    Psych/Spiritual % Change 1.44 %    Family Pre 25.9 %    Family Post 26.4 %    Family % Change 1.93 %    GLOBAL Pre 28.93 %    GLOBAL Post 22.79 %    GLOBAL % Change -21.22 %           Nutrition & Weight - Outcomes:  Pre Biometrics - 05/07/20 1044  Pre Biometrics   Height 5' 9.1" (1.755 m)    Weight 231 lb 6.4 oz (105 kg)    BMI (Calculated) 34.08    Single Leg Stand 5.2 seconds            Nutrition:  Nutrition Therapy & Goals - 06/04/20 0925      Nutrition Therapy   Diet Heart healthy, low Na, CKD stg 4 MNT    Drug/Food Interactions Statins/Certain Fruits    Protein (specify units) 85g    Fiber 30 grams    Whole Grain Foods 3 servings    Saturated Fats 12 max. grams    Fruits and Vegetables 8 servings/day    Sodium 1.5 grams      Personal Nutrition Goals   Nutrition Goal ST: try canned salmon and beans (bean salad) to switch up protein. Add walnuts to breakfast or boild  egg ~2x/week to add protein.  LT: get a variety of vegetables with moderate potassium intake, get a variety of protein foods    Comments No salt since July, not bread at everymeal, no red meat for 4 months. He is lactose intolerant. B: Bowl of life cereal with almond milk with water L: chicken sandwich or [place] - whole wheat bread - sometimes no bread S: unsweet tea D: salads with sliced [place], soups (homemade chicken noodle - reduced salt, vegetable beef soup, clear broth clam "chowder"), pork chops 1x/month, wife doesn't like fish - will get when out to eat, chicken and [place] most of the time. Will have brown rice when he has grains. Vegetables: he loves green beans, corn, brussels sprouts, he doesn't like beets, okra, or asparagus. Uses extra virgin olive oil  or pam spray. He doesn't like canned tuna that he doesn't like. No medication for diabetes, BG is well controlled. 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 Discussed heart healthy and T2DM eating. He would like to get a wider variety of protein.      Intervention Plan   Intervention Prescribe, educate and counsel regarding individualized specific dietary modifications aiming towards targeted core components such as weight, hypertension, lipid management, diabetes, heart failure and other comorbidities.;Nutrition handout(s) given to patient.    Expected Outcomes Short Term Goal: Understand basic principles of dietary content, such as calories, fat, sodium, cholesterol and nutrients.;Short Term Goal: A plan has been developed with personal nutrition goals set during dietitian appointment.;Long Term Goal: Adherence to prescribed nutrition plan.           Nutrition Discharge:  Nutrition Assessments - 09/24/20 1004      MEDFICTS Scores   Pre Score 60    Post Score 6    Score Difference -54           Education Questionnaire Score:  Knowledge Questionnaire Score - 09/24/20 1004      Knowledge Questionnaire Score   Post Score 24/26            Goals reviewed with patient; copy given to patient.

## 2020-10-03 NOTE — Progress Notes (Signed)
Cardiac Individual Treatment Plan  Patient Details  Name: SANDRO BURGO MRN: [id number] Date of Birth: 1951-05-07 Referring Provider:   Flowsheet Row Cardiac Rehab from 05/07/2020 in [facility] Cardiac and Pulmonary Rehab  Referring Provider [name] MD      Initial Encounter Date:  Flowsheet Row Cardiac Rehab from 05/07/2020 in [facility] Cardiac and Pulmonary Rehab  Date 05/07/20      Visit Diagnosis: Status post coronary artery stent placement  NSTEMI (non-ST elevated myocardial infarction) [facility])  Acute non-Q wave non-ST elevation myocardial infarction (NSTEMI) ([place])  Patient's Home Medications on Admission:  Current Outpatient Medications:  .  aspirin 81 MG EC tablet, Take 1 tablet (81 mg total) by mouth daily. Swallow whole., Disp: 30 tablet, Rfl: 11 .  atorvastatin (LIPITOR) 80 MG tablet, Take 1 tablet (80 mg total) by mouth daily., Disp: 90 tablet, Rfl: 1 .  calcitRIOL (ROCALTROL) 0.25 MCG capsule, Take 0.5 mcg by mouth daily. (Prescription instructions say to take 2 capsules by mouth daily), Disp: , Rfl:  .  clopidogrel (PLAVIX) 75 MG tablet, Take 1 tablet (75 mg total) by mouth daily with breakfast., Disp: 90 tablet, Rfl: 1 .  furosemide (LASIX) 40 MG tablet, Take 40 mg by mouth 2 (two) times daily., Disp: , Rfl:  .  metoprolol tartrate (LOPRESSOR) 25 MG tablet, Take 1 tablet (25 mg total) by mouth 2 (two) times daily., Disp: 180 tablet, Rfl: 1 .  nitroGLYCERIN (NITROSTAT) 0.4 MG SL tablet, Place 1 tablet (0.4 mg total) under the tongue every 5 (five) minutes x 3 doses as needed for chest pain., Disp: 25 tablet, Rfl: 12 .  sitaGLIPtin (JANUVIA) 25 MG tablet, Take 1 tablet (25 mg total) by mouth daily., Disp: 30 tablet, Rfl: 3 .  sodium bicarbonate 650 MG tablet, Take 650 mg by mouth 2 (two) times daily. , Disp: , Rfl:  .  sodium zirconium cyclosilicate (LOKELMA) 10 g PACK packet, Take 10 g by mouth 3 (three) times a week., Disp: , Rfl:   Past Medical History: Past Medical  History:  Diagnosis Date  . Anemia   . CKD (chronic kidney disease), stage IV ([place])   . Coronary artery disease    a. s/p IVUS-guided DESx2 to prox & mid LAD, residual disease treated medically. EF 50-55% by recent echo 02/2020.  . Diabetes mellitus with nephropathy ([place]) 2008  . Hyperlipidemia LDL goal <70   . Hypertension 2008  . Lower extremity edema   . Shoulder pain    Right  . Urinary complication     Tobacco Use: Social History   Tobacco Use  Smoking Status Former Smoker  . Packs/day: 1.00  . Years: 30.00  . Pack years: 30.00  . Types: Cigarettes  . Quit date: 08/23/2007  . Years since quitting: 13.1  Smokeless Tobacco Never Used    Labs: Recent Chemical engineer    Labs for ITP Cardiac and Pulmonary Rehab Latest Ref Rng & Units 02/22/2017 06/10/2018 03/01/2020 03/02/2020 05/22/2020   Cholestrol 0 - 200 mg/dL - - 158 137 -   LDLCALC 0 - 99 mg/dL - - 92 88 -   HDL >40 mg/dL - - 31(L) 28(L) -   Trlycerides <150 mg/dL - - 173(H) 106 -   Hemoglobin A1c 4.0 - 5.6 % 9.1 8.0(A) 7.3(H) - 7.3(A)       Exercise Target Goals: Exercise Program Goal: Individual exercise prescription set using results from initial 6 min walk test and THRR while considering  patient's activity barriers  and safety.   Exercise Prescription Goal: Initial exercise prescription builds to 30-45 minutes a day of aerobic activity, 2-3 days per week.  Home exercise guidelines will be given to patient during program as part of exercise prescription that the participant will acknowledge.   Education: Aerobic Exercise: - Group verbal and visual presentation on the components of exercise prescription. Introduces F.I.T.T principle from ACSM for exercise prescriptions.  Reviews F.I.T.T. principles of aerobic exercise including progression. Written material given at graduation. Flowsheet Row Cardiac Rehab from 08/29/2020 in [facility] Cardiac and Pulmonary Rehab  Date 05/23/20  Educator [facility]  Instruction Review  Code 1- Verbalizes Understanding      Education: Resistance Exercise: - Group verbal and visual presentation on the components of exercise prescription. Introduces F.I.T.T principle from ACSM for exercise prescriptions  Reviews F.I.T.T. principles of resistance exercise including progression. Written material given at graduation.    Education: Exercise & Equipment Safety: - Individual verbal instruction and demonstration of equipment use and safety with use of the equipment. Flowsheet Row Cardiac Rehab from 08/29/2020 in [facility] Cardiac and Pulmonary Rehab  Date 03/21/20  Educator [facility]  Instruction Review Code 1- Verbalizes Understanding      Education: Exercise Physiology & General Exercise Guidelines: - Group verbal and written instruction with models to review the exercise physiology of the cardiovascular system and associated critical values. Provides general exercise guidelines with specific guidelines to those with heart or lung disease.  Flowsheet Row Cardiac Rehab from 08/29/2020 in [facility] Cardiac and Pulmonary Rehab  Date 03/26/20  Instruction Review Code 3- Needs Reinforcement  [need identified]      Education: Flexibility, Balance, Mind/Body Relaxation: - Group verbal and visual presentation with interactive activity on the components of exercise prescription. Introduces F.I.T.T principle from ACSM for exercise prescriptions. Reviews F.I.T.T. principles of flexibility and balance exercise training including progression. Also discusses the mind body connection.  Reviews various relaxation techniques to help reduce and manage stress (i.e. Deep breathing, progressive muscle relaxation, and visualization). Balance handout provided to take home. Written material given at graduation. Flowsheet Row Cardiac Rehab from 08/29/2020 in [facility] Cardiac and Pulmonary Rehab  Date 08/08/20  Rosanna Randy to repeat]  Educator [facility]  Instruction Review Code 1- Verbalizes Understanding      Activity Barriers &  Risk Stratification:  Activity Barriers & Cardiac Risk Stratification - 05/07/20 1041      Activity Barriers & Cardiac Risk Stratification   Activity Barriers Joint Problems;Deconditioning;Muscular Weakness;Shortness of Breath;Balance Concerns;History of Falls;Other (comment)    Comments pain in legs with walking (claudication? no PAD Dx)    Cardiac Risk Stratification High           6 Minute Walk:  6 Minute Walk    Row Name 05/07/20 1040 09/12/20 1157       6 Minute Walk   Phase Initial Discharge    Distance 925 feet 1300 feet    Distance % Change - 40.5 %    Distance Feet Change - 375 ft    Walk Time 6 minutes 6 minutes    # of Rest Breaks 0 0    MPH 1.75 2.46    METS 1.87 2.98    RPE 13 15    Perceived Dyspnea  - 0    VO2 Peak 6.5 10.43    Symptoms Yes (comment) Yes (comment)    Comments pain in calves 9/10, legs fatigued calf pain 7/10    Resting HR 73 bpm 67 bpm    Resting BP 128/60  148/78    Resting Oxygen Saturation  96 % -    Exercise Oxygen Saturation  during 6 min walk 99 % -    Max Ex. HR 99 bpm 113 bpm    Max Ex. BP 132/70 172/72    2 Minute Post BP 122/62 -           Oxygen Initial Assessment:   Oxygen Re-Evaluation:   Oxygen Discharge (Final Oxygen Re-Evaluation):   Initial Exercise Prescription:  Initial Exercise Prescription - 05/07/20 1000      Date of Initial Exercise RX and Referring Provider   Date 05/07/20    Referring Provider [name] MD      Treadmill   MPH 1.7    Grade 0    Minutes 15    METs 2.3      Recumbant Bike   Level 1    RPM 50    Watts 5    Minutes 15    METs 2      NuStep   Level 1    SPM 80    Minutes 15    METs 2      REL-XR   Level 1    Speed 50    Minutes 15    METs 2      Prescription Details   Frequency (times per week) 2    Duration Progress to 30 minutes of continuous aerobic without signs/symptoms of physical distress      Intensity   THRR 40-80% of Max Heartrate 104-135     Ratings of Perceived Exertion 11-13    Perceived Dyspnea 0-4      Progression   Progression Continue to progress workloads to maintain intensity without signs/symptoms of physical distress.      Resistance Training   Training Prescription Yes    Weight 3 lb    Reps 10-15           Perform Capillary Blood Glucose checks as needed.  Exercise Prescription Changes:  Exercise Prescription Changes    Row Name 05/07/20 1000 05/20/20 1200 06/05/20 1400 06/18/20 1000 06/19/20 1200     Response to Exercise   Blood Pressure (Admit) 128/60 122/68 126/66 - 124/64   Blood Pressure (Exercise) 132/70 132/64 124/72 - 142/64   Blood Pressure (Exit) 122/62 128/72 120/70 - 130/68   Heart Rate (Admit) 73 bpm 76 bpm 71 bpm - 86 bpm   Heart Rate (Exercise) 99 bpm 110 bpm 105 bpm - 108 bpm   Heart Rate (Exit) 74 bpm 91 bpm 77 bpm - 90 bpm   Oxygen Saturation (Admit) 96 % - - - -   Oxygen Saturation (Exercise) 99 % - - - -   Rating of Perceived Exertion (Exercise) 13 12 13  - 15   Symptoms legs fatigued, calve pain 9/10 - none - -   Comments walk test results first day - - -   Duration - Progress to 30 minutes of  aerobic without signs/symptoms of physical distress Continue with 30 min of aerobic exercise without signs/symptoms of physical distress. - Continue with 30 min of aerobic exercise without signs/symptoms of physical distress.   Intensity - THRR unchanged THRR unchanged - THRR unchanged     Progression   Progression - Continue to progress workloads to maintain intensity without signs/symptoms of physical distress. Continue to progress workloads to maintain intensity without signs/symptoms of physical distress. - Continue to progress workloads to maintain intensity without signs/symptoms of physical distress.   Average METs -  2.2 2.06 - 2.2     Resistance Training   Training Prescription - Yes Yes - Yes   Weight - 3 lb 4 lb - 5 lb   Reps - 10-15 10-15 - 10-15     Interval Training    Interval Training - - No - No     Treadmill   MPH - 2.5 1.7 - 1.7   Grade - 0.5 0.5 - 0.5   Minutes - 15 15 - 15   METs - 3.09 2.42 - 2.42     REL-XR   Level - 3 1 - 3   Speed - 50 - - 50   Minutes - 15 15 - 15   METs - 1.3 1.7 - -     Home Exercise Plan   Plans to continue exercise at - - - Home (comment)  walking, treadmill, cross trainer Home (comment)  walking, treadmill, cross trainer   Frequency - - - Add 3 additional days to program exercise sessions. Add 3 additional days to program exercise sessions.   Initial Home Exercises Provided - - - 06/18/20 06/18/20   Row Name 07/03/20 1200 07/16/20 1200 07/29/20 1600 08/12/20 1000 09/09/20 1600     Response to Exercise   Blood Pressure (Admit) 128/64 124/64 124/70 144/70 122/64   Blood Pressure (Exercise) 150/68 132/70 136/70 134/70 130/70   Blood Pressure (Exit) 148/74 118/60 132/70 112/70 124/72   Heart Rate (Admit) 86 bpm 82 bpm 81 bpm 78 bpm 96 bpm   Heart Rate (Exercise) 104 bpm 100 bpm 103 bpm 101 bpm 114 bpm   Heart Rate (Exit) 86 bpm 71 bpm 76 bpm 79 bpm 98 bpm   Rating of Perceived Exertion (Exercise) _0 Duration Continue with 30 min of aerobic exercise without signs/symptoms of physical distress. Continue with 30 min of aerobic exercise without signs/symptoms of physical distress. Continue with 30 min of aerobic exercise without signs/symptoms of physical distress. Continue with 30 min of aerobic exercise without signs/symptoms of physical distress. Continue with 30 min of aerobic exercise without signs/symptoms of physical distress.   Intensity _1      Progression   Progression Continue to progress workloads to maintain intensity without signs/symptoms of physical distress. Continue to progress workloads to maintain intensity without signs/symptoms of physical distress. Continue to progress workloads to maintain intensity without  signs/symptoms of physical distress. Continue to progress workloads to maintain intensity without signs/symptoms of physical distress. Continue to progress workloads to maintain intensity without signs/symptoms of physical distress.   Average METs 3.6 2.3 2.2 2.2 2.35     Resistance Training   Training Prescription _2    Weight 5 lb 5 lb 5 lb 5 lb 5 lb   Reps 10-15 10-15 10-15 10-15 10-15     Interval Training   Interval Training - No No No No     Treadmill   MPH 1._3 - -   Grade 0.5 0.5 0.5 - -   Minutes _4 - -   METs 2.42 2.67 2.67 - -     NuStep   Level - - - 4 3   SPM - - - 80 80   Minutes - - - 15 15   METs - - - 2.3 2.7     REL-XR   Level 3 - 3 - -   Speed 50 - 50 - -  Minutes 15 - 15 - -   METs 4.8 - 1.3 - -     Biostep-RELP   Level - - - 4 1   SPM - - - 80 80   Minutes - - - 15 15   METs - - - 2 2     Home Exercise Plan   Plans to continue exercise at Home (comment)  walking, treadmill, cross trainer Home (comment)  walking, treadmill, cross trainer Home (comment)  walking, treadmill, cross trainer - Home (comment)  walking, treadmill, cross trainer   Frequency Add 3 additional days to program exercise sessions. Add 3 additional days to program exercise sessions. Add 3 additional days to program exercise sessions. - Add 3 additional days to program exercise sessions.   Initial Home Exercises Provided 06/18/20 06/18/20 06/18/20 - 06/18/20   Row Name 09/24/20 1600             Response to Exercise   Blood Pressure (Admit) 148/64       Blood Pressure (Exercise) 152/70       Blood Pressure (Exit) 142/72       Heart Rate (Admit) 81 bpm       Heart Rate (Exercise) 110 bpm       Heart Rate (Exit) 91 bpm       Rating of Perceived Exertion (Exercise) 16       Symptoms none       Duration Continue with 30 min of aerobic exercise without signs/symptoms of physical distress.       Intensity THRR unchanged               Progression    Progression Continue to progress workloads to maintain intensity without signs/symptoms of physical distress.       Average METs 2.32               Resistance Training   Training Prescription Yes       Weight 5 lb       Reps 10-15               Interval Training   Interval Training No               Treadmill   MPH 2       Grade 0.5       Minutes 15       METs 2.67               NuStep   Level 4       Minutes 15       METs 3.6               REL-XR   Level 3       Minutes 15       METs 1.4               Biostep-RELP   Level 1       Minutes 15       METs 2               Home Exercise Plan   Plans to continue exercise at Home (comment)  walking, treadmill, cross trainer       Frequency Add 3 additional days to program exercise sessions.       Initial Home Exercises Provided 06/18/20              Exercise Comments:  Exercise Comments    Row Name 05/14/20 1148  Exercise Comments First full day of exercise!  Patient was oriented to gym and equipment including functions, settings, policies, and procedures.  Patient's individual exercise prescription and treatment plan were reviewed.  All starting workloads were established based on the results of the 6 minute walk test done at initial orientation visit.  The plan for exercise progression was also introduced and progression will be customized based on patient's performance and goals.              Exercise Goals and Review:   Exercise Goals Re-Evaluation :  Exercise Goals Re-Evaluation    Row Name 05/14/20 1148 05/30/20 1006 06/05/20 1413 06/18/20 1028 06/19/20 1258     Exercise Goal Re-Evaluation   Exercise Goals Review Able to understand and use rate of perceived exertion (RPE) scale;Knowledge and understanding of Target Heart Rate Range (THRR);Understanding of Exercise Prescription Able to understand and use rate of perceived exertion (RPE) scale;Knowledge and understanding of Target Heart Rate  Range (THRR);Understanding of Exercise Prescription Increase Physical Activity;Increase Strength and Stamina;Understanding of Exercise Prescription Increase Physical Activity;Increase Strength and Stamina;Understanding of Exercise Prescription Increase Physical Activity;Increase Strength and Stamina;Understanding of Exercise Prescription   Comments Reviewed RPE and dyspnea scales, THR and program prescription with pt today.  Pt voiced understanding and was given a copy of goals to take home. Gala Romney does not do any exercise at home as of yet. He recent;y came back to rehab after he had a stent placed and feels much better, he rarely experiences chest pain and feels confident to start exercise at home. He has 3 different machines he can chose from. EP will review home exercise in the next couple of weeks. Gala Romney is doing well in rehab.  He is now up to 1.7 METs on the recumbent elliptical and up to 4lb hand weights.  He should be ready to start to increase his workloads. Gala Romney is doing well in rehab.  He has started to add in some exericse at home.  He is not sure how fast he is going on his treadmill so we talked about using his heart rate and RPE to help gauge his intensity. Reviewed home exercise with pt today.  Pt plans to walk and use treadmill at home for exercise.  He also has a cross traininer that he can use at home. Reviewed THR, pulse, RPE, sign and symptoms, pulse oximetery and when to call 911 or MD.  Also discussed weather considerations and indoor options.  Pt voiced understanding. Gala Romney is doing well in rehab.  He has started to add in some exericse at home.  He is not sure how fast he is going on his treadmill so we talked about using his heart rate and RPE to help gauge his intensity. Reviewed home exercise with pt today.  Pt plans to walk and use treadmill at home for exercise.  He also has a cross traininer that he can use at home. Reviewed THR, pulse, RPE, sign and symptoms, pulse oximetery and when to  call 911 or MD.  Also discussed weather considerations and indoor options.  Pt voiced understanding.   Expected Outcomes Short: Use RPE daily to regulate intensity. Long: Follow program prescription in THR. Short: Review home exercise and expectations Long: Able to exercise independently at home with no complications Short: Increase workloads Long: Continue to improve stamina Short: Continue to add in exercise at home and start using heart rate to judge  Long: Continue to improve stamina. Short: Continue to add in exercise at  home and start using heart rate to judge  Long: Continue to improve stamina.   Row Name 07/03/20 1252 07/11/20 0132 07/16/20 1247 07/29/20 1659 08/08/20 0958     Exercise Goal Re-Evaluation   Exercise Goals Review - Increase Physical Activity;Increase Strength and Stamina;Understanding of Exercise Prescription Increase Physical Activity;Increase Strength and Stamina;Understanding of Exercise Prescription Increase Physical Activity;Increase Strength and Stamina Increase Physical Activity;Increase Strength and Stamina;Understanding of Exercise Prescription   Comments Gala Romney is progressing well and has increased resistance on XR.  He has moved up to 5 lb for strength work.  Staff will encourage increasing incline on TM and continued progression on seated machines. Gala Romney is doing well in rehab.  He is feeling better and able to do his yard work again and has been blowing leaves recently. His stamina is better.  He will get back to his treadmill once he is done with leaves. Gala Romney has increased TM during class to 2 and .5 incline.  He asked staff about a cramp in his calf that starts after 15 min on TM.  I had him stop and stretch and that helped.  He reports no swelling or redness in the area.  He did say his calves have always been very tight. Staff reviewed stretching for the calves. Gala Romney has only attended 4 times so far this month.  Consistent exercise would be beneficial for progress.  Staff  will monitor progress. Gala Romney is doing well in rehab.  He is feeling better overall.  He has been using his treadmill recently.  He hasn't been feeling as well this week.   Expected Outcomes Short:  continue to attend consistently and check HR on his own Long: increase MET level Short: Continue to exercise daily  Long: Get back to treadmill at home after leaf season Short: try to stretch calves daily Long:  walk without calf tightness Short: increase level on XR Long: improve TM walk and overall stamina Short: Continue to use treadmill daily Long: Continue to improve stamina.   Row Name 08/12/20 1019 08/28/20 1406 09/05/20 0952 09/24/20 1615       Exercise Goal Re-Evaluation   Exercise Goals Review Increase Physical Activity;Increase Strength and Stamina - Increase Physical Activity;Increase Strength and Stamina;Understanding of Exercise Prescription Increase Physical Activity;Increase Strength and Stamina;Understanding of Exercise Prescription    Comments Gala Romney will benefit from using his TM outside program sessions as he is only here 2 days per week.  We will monitor progress. Out with COVID since last review Doug returned today after being out with COVID and work.  He has been staying busy when at work.  He is feeling stronger and has more stamina.  He is set to do his post next week.  He is trying to figure out a plan for when he graduates.  Previously he was going to his wife's gym but his insurance no longer pays for it.  He does have a treadmill and crosstrainier at home that he is thinking about using. Gala Romney is doing great!  He is nearing graduation and improved his post walk by 375 ft!!  He is going to continue to exercise at home on his equipment.    Expected Outcomes Short:  get in Tm outside porgram sessions Long:  build overall stamina - Short: Improve post Long: Continue to exercise independently Continue to exercise independently           Discharge Exercise Prescription (Final  Exercise Prescription Changes):  Exercise Prescription Changes - 09/24/20  1600      Response to Exercise   Blood Pressure (Admit) 148/64    Blood Pressure (Exercise) 152/70    Blood Pressure (Exit) 142/72    Heart Rate (Admit) 81 bpm    Heart Rate (Exercise) 110 bpm    Heart Rate (Exit) 91 bpm    Rating of Perceived Exertion (Exercise) 16    Symptoms none    Duration Continue with 30 min of aerobic exercise without signs/symptoms of physical distress.    Intensity THRR unchanged      Progression   Progression Continue to progress workloads to maintain intensity without signs/symptoms of physical distress.    Average METs 2.32      Resistance Training   Training Prescription Yes    Weight 5 lb    Reps 10-15      Interval Training   Interval Training No      Treadmill   MPH 2    Grade 0.5    Minutes 15    METs 2.67      NuStep   Level 4    Minutes 15    METs 3.6      REL-XR   Level 3    Minutes 15    METs 1.4      Biostep-RELP   Level 1    Minutes 15    METs 2      Home Exercise Plan   Plans to continue exercise at Home (comment)   walking, treadmill, cross trainer   Frequency Add 3 additional days to program exercise sessions.    Initial Home Exercises Provided 06/18/20           Nutrition:  Target Goals: Understanding of nutrition guidelines, daily intake of sodium '1500mg'$ , cholesterol '200mg'$ , calories 30% from fat and 7% or less from saturated fats, daily to have 5 or more servings of fruits and vegetables.  Education: All About Nutrition: -Group instruction provided by verbal, written material, interactive activities, discussions, models, and posters to present general guidelines for heart healthy nutrition including fat, fiber, MyPlate, the role of sodium in heart healthy nutrition, utilization of the nutrition label, and utilization of this knowledge for meal planning. Follow up email sent as well. Written material given at graduation. Flowsheet Row  Cardiac Rehab from 08/29/2020 in [facility] Cardiac and Pulmonary Rehab  Date 04/11/20  Educator [facility]  Instruction Review Code 1- Verbalizes Understanding  [need identified]      Biometrics:  Pre Biometrics - 05/07/20 1044      Pre Biometrics   Height 5' 9.1" (1.755 m)    Weight 231 lb 6.4 oz (105 kg)    BMI (Calculated) 34.08    Single Leg Stand 5.2 seconds            Nutrition Therapy Plan and Nutrition Goals:  Nutrition Therapy & Goals - 06/04/20 0925      Nutrition Therapy   Diet Heart healthy, low Na, CKD stg 4 MNT    Drug/Food Interactions Statins/Certain Fruits    Protein (specify units) 85g    Fiber 30 grams    Whole Grain Foods 3 servings    Saturated Fats 12 max. grams    Fruits and Vegetables 8 servings/day    Sodium 1.5 grams      Personal Nutrition Goals   Nutrition Goal ST: try canned salmon and beans (bean salad) to switch up protein. Add walnuts to breakfast or boild egg ~2x/week to add protein.  LT: get a variety of  vegetables with moderate potassium intake, get a variety of protein foods    Comments No salt since July, not bread at everymeal, no red meat for 4 months. He is lactose intolerant. B: Bowl of life cereal with almond milk with water L: chicken sandwich or [place] - whole wheat bread - sometimes no bread S: unsweet tea D: salads with sliced [place], soups (homemade chicken noodle - reduced salt, vegetable beef soup, clear broth clam "chowder"), pork chops 1x/month, wife doesn't like fish - will get when out to eat, chicken and [place] most of the time. Will have brown rice when he has grains. Vegetables: he loves green beans, corn, brussels sprouts, he doesn't like beets, okra, or asparagus. Uses extra virgin olive oil  or pam spray. He doesn't like canned tuna that he doesn't like. No medication for diabetes, BG is well controlled. 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 Discussed heart healthy and T2DM eating. He would like to get a wider variety of protein.       Intervention Plan   Intervention Prescribe, educate and counsel regarding individualized specific dietary modifications aiming towards targeted core components such as weight, hypertension, lipid management, diabetes, heart failure and other comorbidities.;Nutrition handout(s) given to patient.    Expected Outcomes Short Term Goal: Understand basic principles of dietary content, such as calories, fat, sodium, cholesterol and nutrients.;Short Term Goal: A plan has been developed with personal nutrition goals set during dietitian appointment.;Long Term Goal: Adherence to prescribed nutrition plan.           Nutrition Assessments:  Nutrition Assessments - 09/24/20 1004      MEDFICTS Scores   Pre Score 60    Post Score 6    Score Difference -54          MEDIFICTS Score Key:  ?70 Need to make dietary changes   40-70 Heart Healthy Diet  ? 40 Therapeutic Level Cholesterol Diet   Picture Your Plate Scores:  <09 Unhealthy dietary pattern with much room for improvement.  41-50 Dietary pattern unlikely to meet recommendations for good health and room for improvement.  51-60 More healthful dietary pattern, with some room for improvement.   >60 Healthy dietary pattern, although there may be some specific behaviors that could be improved.    Nutrition Goals Re-Evaluation:  Nutrition Goals Re-Evaluation    [place] Name 06/18/20 1006 07/11/20 0958 08/08/20 1004 09/05/20 0957       Goals   Nutrition Goal ST: try canned salmon and beans (bean salad) to switch up protein. Add walnuts to breakfast or boild egg ~2x/week to add protein.  LT: get a variety of vegetables with moderate potassium intake, get a variety of protein foods Short: Continue to to add vareity  Long: Continue to focus on healthier eating. Short: Continue to to add vareity  Long: Continue to focus on healthier eating. Short: Find some wheat bread alternatives to help  Long: Continue to eat healthier    Comment [name] is doing  well with his diet.  He has not tried the bean salad and prefers fresh fish versus canned.  He did admit to hamburger in his spaghetti.  He prefers chicken to read meat.  He eats a lot of salds and tries for two vegetables at each meal. [name] is doing well with his diet.  He feels that he is still retaining fluid but in now really watching his sodium intake.  He is washing his canned vegetables and no longer adding salt  at the table.  He is eating a wide variety of food especially vegetables. Doug indulged some over the holidays, but is now ready to buckle down. He does miss bread, so we talked about finding a whole grain version.  He is really watching his sodium.  He has been doing better with variety of foods. [name] has been getting away from bread and really watching his salt intake.  He does indulge in a cookie on occasion.  He is trying to get in more variety.    Expected Outcome Short: Continue to to add vareity  Long: Continue to focus on healthier eating. Short: Continue to watch sodium Long: Continue to eat healthy. Short: Find some wheat bread alternatives to help  Long: Continue to eat healthier Short: Continue to watch sodium Long; Continue to eat better.           Nutrition Goals Discharge (Final Nutrition Goals Re-Evaluation):  Nutrition Goals Re-Evaluation - 09/05/20 0957      Goals   Nutrition Goal Short: Find some wheat bread alternatives to help  Long: Continue to eat healthier    Comment [name] has been getting away from bread and really watching his salt intake.  He does indulge in a cookie on occasion.  He is trying to get in more variety.    Expected Outcome Short: Continue to watch sodium Long; Continue to eat better.           Psychosocial: Target Goals: Acknowledge presence or absence of significant depression and/or stress, maximize coping skills, provide positive support system. Participant is able to verbalize types and ability to use techniques and skills needed for  reducing stress and depression.   Education: Stress, Anxiety, and Depression - Group verbal and visual presentation to define topics covered.  Reviews how body is impacted by stress, anxiety, and depression.  Also discusses healthy ways to reduce stress and to treat/manage anxiety and depression.  Written material given at graduation. Flowsheet Row Cardiac Rehab from 08/29/2020 in [facility] Cardiac and Pulmonary Rehab  Date 07/11/20  Educator SB  Instruction Review Code 1- [employer] Understanding      Education: Sleep Hygiene -Provides group verbal and written instruction about how sleep can affect your health.  Define sleep hygiene, discuss sleep cycles and impact of sleep habits. Review good sleep hygiene tips.    Initial Review & Psychosocial Screening:   Quality of Life Scores:   Quality of Life - 09/24/20 1006      Quality of Life   Select Quality of Life      Quality of Life Scores   Health/Function Pre 14.43 %    Health/Function Post 22.47 %    Health/Function % Change 55.72 %    Socioeconomic Pre 23.07 %    Socioeconomic Post 23.81 %    Socioeconomic % Change  3.21 %    Psych/Spiritual Pre 19.43 %    Psych/Spiritual Post 19.71 %    Psych/Spiritual % Change 1.44 %    Family Pre 25.9 %    Family Post 26.4 %    Family % Change 1.93 %    GLOBAL Pre 28.93 %    GLOBAL Post 22.79 %    GLOBAL % Change -21.22 %          Scores of 19 and below usually indicate a poorer quality of life in these areas.  A difference of  2-3 points is a clinically meaningful difference.  A difference of 2-3 points in the total score  of the Quality of Life Index has been associated with significant improvement in overall quality of life, self-image, physical symptoms, and general health in studies assessing change in quality of life.  PHQ-9: Recent Review Flowsheet Data    Depression screen [facility] 2/9 09/24/2020 05/07/2020 03/26/2020 05/24/2019   Decreased Interest 0 0 1 0   Down, Depressed, Hopeless  0 0 1 0   PHQ - 2 Score 0 0 2 0   Altered sleeping _0 -   Tired, decreased energy 0 1 2 -   Change in appetite 0 1 3 -   Feeling bad or failure about yourself  0 0 0 -   Trouble concentrating 0 0 0 -   Moving slowly or fidgety/restless 0 0 0 -   Suicidal thoughts 0 0 0 -   PHQ-9 Score _1 -   Difficult doing work/chores Not difficult at all Not difficult at all Not difficult at all -     Interpretation of Total Score  Total Score Depression Severity:  1-4 = Minimal depression, 5-9 = Mild depression, 10-14 = Moderate depression, 15-19 = Moderately severe depression, 20-27 = Severe depression   Psychosocial Evaluation and Intervention:   Psychosocial Re-Evaluation:  Psychosocial Re-Evaluation    Row Name 05/30/20 1016 06/18/20 1001 07/11/20 0955 08/08/20 1001 09/05/20 0955     Psychosocial Re-Evaluation   Current issues with Current Sleep Concerns Current Sleep Concerns;Current Stress Concerns Current Sleep Concerns;Current Stress Concerns Current Sleep Concerns;Current Stress Concerns Current Sleep Concerns;Current Stress Concerns   Comments [name] is doing well mentally. He stated he has not slept well since a while ago since he is up a lot to go to the bathroom in the middle of the night. He has already talked to his doctor about it and has declined taking any sleep-aid or medications to help with sleep. Denied big stressors or anxiety related events. He has great support from his wife. He is feeling much better since he has his stent placed as he rarely feels chest pain. Therefore, he feels he can go out and do more which he is happy about. [name] is doing well in rehab.  He is doing well mentally.  He takes it one day at a time and deals with things as they come.  He tries not to worry about anything and just does what he can.  He would love uninterrupted sleep, but his fluid keeps him getting up at night to go to bathroom.  He is able to get back to sleep afterwards pretty easily.   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  But he took control and asked for them to refrain from bad behavior and language. [name] is doing well mentally.  He continues to stay positive.  His bathroom trips have continued to keep him up at night, but they are getting better.  He is up to 3-3.5 hours before getting up. He is going to vien specialist tomorrow to get worked up for fistula.  He is not excited about all this. [name] is doing well in rehab.  He continues to focus on the positive.  He has not felt well that past couple of days.  His sleep is staying about the same.  He does use tylenol PM on occassion, but we talked about using melatonin instead.  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 [name] was out sick with COVID for a while, then got called into to work, so he is back after missing a few weeks.  He tries not to let things get to him too much.  His work schedule keeps him busy.  Sleep continues to do okay.  He did try the melatonin but still only got about 3 hrs of sleep.  He was able to get back to sleep.  His sleep issues have been ongoing since he was in his thirties.   Expected Outcomes Short: Continue attending rehab consistently Long: Utilize exercise for stress management and maintain positive attitude Short: Continue to sleep as best as possible Long; Continue to stay positive. Short: Get through appointment tomorrow.  Long: Continue to stay positive Short: Try melatonin for sleep Long: Continue to stay positive. Short: Continue to work on sleep Long; Continue to focus positive.   Interventions Encouraged to attend Cardiac Rehabilitation for the exercise Encouraged to attend Cardiac Rehabilitation for the exercise Encouraged to attend Cardiac Rehabilitation for the exercise Encouraged to attend Cardiac Rehabilitation for the exercise Encouraged to attend  Cardiac Rehabilitation for the exercise   Continue Psychosocial Services  Follow up required by staff Follow up required by staff Follow up required by staff Follow up required by staff Follow up required by staff          Psychosocial Discharge (Final Psychosocial Re-Evaluation):  Psychosocial Re-Evaluation - 09/05/20 0955      Psychosocial Re-Evaluation   Current issues with Current Sleep Concerns;Current Stress Concerns    Comments [name] was out sick with COVID for a while, then got called into to work, so he is back after missing a few weeks.  He tries not to let things get to him too much.  His work schedule keeps him busy.  Sleep continues to do okay.  He did try the melatonin but still only got about 3 hrs of sleep.  He was able to get back to sleep.  His sleep issues have been ongoing since he was in his thirties.    Expected Outcomes Short: Continue to work on sleep Long; Continue to focus positive.    Interventions Encouraged to attend Cardiac Rehabilitation for the exercise    Continue Psychosocial Services  Follow up required by staff           Vocational Rehabilitation: Provide vocational rehab assistance to qualifying candidates.   Vocational Rehab Evaluation & Intervention:   Education: Education Goals: Education classes will be provided on a variety of topics geared toward better understanding of heart health and risk factor modification. Participant will state understanding/return demonstration of topics presented as noted by education test scores.  Learning Barriers/Preferences:   General Cardiac Education Topics:  AED/CPR: - Group verbal and written instruction with the use of models to demonstrate the basic use of the AED with the basic ABC's of resuscitation.   Anatomy and Cardiac Procedures: - Group verbal and visual presentation and models provide information about basic cardiac anatomy and function. Reviews the testing methods done to diagnose heart  disease and the outcomes of the test results. Describes the treatment choices: Medical Management, Angioplasty, or Coronary Bypass Surgery for treating various heart conditions including Myocardial Infarction, Angina, Valve Disease, and Cardiac Arrhythmias.  Written material given at graduation. Flowsheet Row Cardiac Rehab from 08/29/2020 in [facility] Cardiac and Pulmonary Rehab  Date 03/26/20  Instruction Review Code 3- Needs Reinforcement  [need  identified]      Medication Safety: - Group verbal and visual instruction to review commonly prescribed medications for heart and lung disease. Reviews the medication, class of the drug, and side effects. Includes the steps to properly store meds and maintain the prescription regimen.  Written material given at graduation.   Intimacy: - Group verbal instruction through game format to discuss how heart and lung disease can affect sexual intimacy. Written material given at graduation.. Flowsheet Row Cardiac Rehab from 08/29/2020 in [facility] Cardiac and Pulmonary Rehab  Date 05/23/20  Educator [facility]  Instruction Review Code 1- Verbalizes Understanding      Know Your Numbers and Heart Failure: - Group verbal and visual instruction to discuss disease risk factors for cardiac and pulmonary disease and treatment options.  Reviews associated critical values for Overweight/Obesity, Hypertension, Cholesterol, and Diabetes.  Discusses basics of heart failure: signs/symptoms and treatments.  Introduces Heart Failure Zone chart for action plan for heart failure.  Written material given at graduation. Flowsheet Row Cardiac Rehab from 08/29/2020 in [facility] Cardiac and Pulmonary Rehab  Date 06/25/20  Educator [facility]  Instruction Review Code 1- Verbalizes Understanding      Infection Prevention: - Provides verbal and written material to individual with discussion of infection control including proper hand washing and proper equipment cleaning during exercise session. Flowsheet  Row Cardiac Rehab from 08/29/2020 in [facility] Cardiac and Pulmonary Rehab  Date 03/21/20  Educator [facility]  Instruction Review Code 1- Verbalizes Understanding      Falls Prevention: - Provides verbal and written material to individual with discussion of falls prevention and safety. Flowsheet Row Cardiac Rehab from 08/29/2020 in [facility] Cardiac and Pulmonary Rehab  Date 03/21/20  Educator [facility]  Instruction Review Code 1- Verbalizes Understanding      Other: -Provides group and verbal instruction on various topics (see comments)   Knowledge Questionnaire Score:  Knowledge Questionnaire Score - 09/24/20 1004      Knowledge Questionnaire Score   Post Score 24/26           Core Components/Risk Factors/Patient Goals at Admission:   Education:Diabetes - Individual verbal and written instruction to review signs/symptoms of diabetes, desired ranges of glucose level fasting, after meals and with exercise. Acknowledge that pre and post exercise glucose checks will be done for 3 sessions at entry of program. Flowsheet Row Cardiac Rehab from 08/29/2020 in [facility] Cardiac and Pulmonary Rehab  Date 03/21/20  Educator [facility]  Instruction Review Code 1- Verbalizes Understanding      Core Components/Risk Factors/Patient Goals Review:   Goals and Risk Factor Review    Row Name 05/30/20 1010 06/18/20 1003 07/11/20 1002 08/08/20 1007 09/05/20 1000     Core Components/Risk Factors/Patient Goals Review   Personal Goals Review Weight Management/Obesity;Hypertension;Lipids;Diabetes Weight Management/Obesity;Hypertension;Diabetes Weight Management/Obesity;Hypertension;Diabetes Weight Management/Obesity;Hypertension;Diabetes Weight Management/Obesity;Hypertension;Diabetes   Review Brance takes his blood sugars at home once per day which have been stable. He also takes BP at home,  and reports 130  or lower for systolic below 80 for diastolic. BPs at rehab have also been in appropriate range.  He weighs himself every  morning- stable with weight but wants to lose weight as one of his main goals. He same given up white bread and has consumed less simple carbs. He has a meeting with the RD soon to talk more about weight loss and establish nutrition goals.H feels his pants are big and shirt feels looser. Gala Romney is doing well in rehab.  His weight is staying steady. He would  like to lose more, reviewed exercise today so hopefully adding some in will be helpful.  His left calf pain has resolved but his right calf still has a slight knot.  He is feeling a lot better.  His blood pressures have been doing well and staying below 150s.  He has also gotten away from using salt.  His sugars are doing well and he reports they are good. His A1c is down to 7.3!!  He is pleased with his progress.  He is doing well with his medications overall.The [place] is doing better now at the lower dose. [name] is doing well in rehab. His weight continues to fluctuate up and down.  At home he is around 227 lb and continues to trend down overall. His blood pressures have been good in class and at home.  His sugars continue to do well too. [name] is doing well in rehab.  His weight was up over the holidays but starting to come back down again.  He wants to get rid of his holiday weight.  His sugars were up some but getting back to better control and generally staying below 150s overall.  His blood pressures have been slightly elevated in class this week, but have been good at home.  He usually stays below 130s at home.  He has not had his meds yet today. [name] is doing well in rehab and nearing graduation.  His weight is coming back down from the holidays. [name] is doing well with his blood pressures.  His sugars are doing better and he continues to keep a close eye on it.   Expected Outcomes Short: Meet with RD to discuss weight loss goals Long: Continue to manage lifestyle risk factors Short: Continue to work on weight loss long; Continue to monitor risk factors.  Short: Continue to work on weight loss long; Continue to monitor risk factors. Short: Continue to work weight loss Long: Continue to monitor risk factors. Short: Continue to work on weight loss Long: Continue to monitor risk factors          Core Components/Risk Factors/Patient Goals at Discharge (Final Review):   Goals and Risk Factor Review - 09/05/20 1000      Core Components/Risk Factors/Patient Goals Review   Personal Goals Review Weight Management/Obesity;Hypertension;Diabetes    Review [name] is doing well in rehab and nearing graduation.  His weight is coming back down from the holidays. [name] is doing well with his blood pressures.  His sugars are doing better and he continues to keep a close eye on it.    Expected Outcomes Short: Continue to work on weight loss Long: Continue to monitor risk factors           ITP Comments:  ITP Comments    Row Name 05/07/20 1040 05/08/20 0608 05/14/20 1148 05/14/20 1150 06/05/20 0608   ITP Comments Completed 6MWT and gym orientation. Initial ITP created and sent for review to Dr. Emily Filbert, Medical Director.  Restarting with new stents and MD. 30 Day review completed. Medical Director ITP review done, changes made as directed, and signed approval by Medical Director. First full day of exercise!  Patient was oriented to gym and equipment including functions, settings, policies, and procedures.  Patient's individual exercise prescription and treatment plan were reviewed.  All starting workloads were established based on the results of the 6 minute walk test done at initial orientation visit.  The plan for exercise progression was also introduced and progression will be customized based  on patient's performance and goals. [name] is returning after new stent placed for persistent angina. 30 Day review completed. Medical Director ITP review done, changes made as directed, and signed approval by Medical Director.   [place] Name 07/03/20 [phone] 07/31/20 0521 08/28/20  0845 08/28/20 1406 09/25/20 0607   ITP Comments 30 Day review completed. Medical Director ITP review done, changes made as directed, and signed approval by Medical Director. 30 Day review completed. Medical Director ITP review done, changes made as directed, and signed approval by Medical Director. 30 Day review completed. Medical Director ITP review done, changes made as directed, and signed approval by Medical Director. Out since 08/09/19 with COVID 30 Day review completed. Medical Director ITP review done, changes made as directed, and signed approval by Medical Director.   Row Name 10/03/20 1008           ITP Comments Esiquio graduated today from  rehab with 36 sessions completed.  Details of the patient's exercise prescription and what He needs to do in order to continue the prescription and progress were discussed with patient.  Patient was given a copy of prescription and goals.  Patient verbalized understanding.  Jamesyn plans to continue to exercise by working out at home.              Comments: discharge ITP

## 2020-10-15 ENCOUNTER — Other Ambulatory Visit: Payer: Self-pay

## 2020-10-30 ENCOUNTER — Encounter (HOSPITAL_COMMUNITY): Payer: Self-pay | Admitting: Vascular Surgery

## 2020-10-30 ENCOUNTER — Other Ambulatory Visit: Payer: PPO

## 2020-10-30 ENCOUNTER — Other Ambulatory Visit: Payer: Self-pay

## 2020-10-30 ENCOUNTER — Other Ambulatory Visit
Admission: RE | Admit: 2020-10-30 | Discharge: 2020-10-30 | Disposition: A | Discharge status: Home / Self Care | Payer: PPO | Source: Ambulatory Visit | Attending: Vascular Surgery | Admitting: Vascular Surgery

## 2020-10-30 DIAGNOSIS — Z20822 Contact with and (suspected) exposure to covid-19: Secondary | ICD-10-CM | POA: Insufficient documentation

## 2020-10-30 DIAGNOSIS — Z01812 Encounter for preprocedural laboratory examination: Secondary | ICD-10-CM | POA: Diagnosis not present

## 2020-10-30 LAB — SARS CORONAVIRUS 2 (TAT 6-24 HRS): SARS Coronavirus 2: NEGATIVE

## 2020-10-30 NOTE — Anesthesia Preprocedure Evaluation (Addendum)
Anesthesia Evaluation  Patient identified by MRN, date of birth, ID band Patient awake    Reviewed: Allergy & Precautions, NPO status , Patient's Chart, lab work & pertinent test results  Airway Mallampati: IV  TM Distance: >3 FB Neck ROM: Full    Dental no notable dental hx.    Pulmonary former smoker,    Pulmonary exam normal breath sounds clear to auscultation       Cardiovascular hypertension, Pt. on home beta blockers + CAD, + Past MI and + Cardiac Stents  Normal cardiovascular exam Rhythm:Regular Rate:Normal  ECG: NSR   Neuro/Psych negative neurological ROS  negative psych ROS   GI/Hepatic negative GI ROS, Neg liver ROS,   Endo/Other  diabetes, Oral Hypoglycemic Agents  Renal/GU CRFRenal disease     Musculoskeletal negative musculoskeletal ROS (+)   Abdominal (+) + obese,   Peds  Hematology  (+) anemia , HLD   Anesthesia Other Findings ESRD  Reproductive/Obstetrics                           Anesthesia Physical Anesthesia Plan  ASA: III  Anesthesia Plan: Regional   Post-op Pain Management:    Induction: Intravenous  PONV Risk Score and Plan: 1 and Ondansetron, Dexamethasone, Propofol infusion, Midazolam and Treatment may vary due to age or medical condition  Airway Management Planned: Simple Face Mask  Additional Equipment:   Intra-op Plan:   Post-operative Plan:   Informed Consent: I have reviewed the patients History and Physical, chart, labs and discussed the procedure including the risks, benefits and alternatives for the proposed anesthesia with the patient or authorized representative who has indicated his/her understanding and acceptance.     Dental advisory given  Plan Discussed with: CRNA  Anesthesia Plan Comments: (PAT note by Karoline Caldwell, PA-C: Follows with cardiology for history of CAD s/p NSTEMI 02/2020 and DESx2 to LAD 04/24/20, HTN, HLD.  Last seen by  Dr. Rockey Situ 07/02/2020.  Per note, doing well, no angina.  Per telephone encounter [phone] he was cleared to be placed on renal transplant list, "Chart reviewed as part of pre-operative protocol coverage. Given past medical history and time since last visit, based on ACC/AHA guidelines, LENWOOD BALSAM would be at acceptable risk for the planned procedure without further cardiovascular testing. He may be listed on transplant list.  Stable from cardiac standpoint following most recent PCI. He will need to continue clopidogrel."  Patient completed 36 sessions of cardiac rehab program on 10/03/2020.  DM2, last A1c 7.3 on 05/22/2020.  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  Patient will need day of surgery labs and evaluation.  EKG 05/02/2020: Sinus rhythm first-degree AV block.  Rate 66.  Lateral T wave inversions.  Cath and PCI 04/24/20 Ost RCA to Prox RCA lesion is 30% stenosed. Ost LAD to Prox LAD lesion is 40% stenosed. Prox LAD to Mid LAD lesion is 99% stenosed. Post intervention, there is a 0% residual stenosis. A drug-eluting stent was successfully placed using a STENT RESOLUTE ONYX 4.0X15. Mid LAD lesion is 90% stenosed. Post intervention, there is a 0% residual stenosis. A drug-eluting stent was successfully placed using a STENT RESOLUTE ONYX 3.0X15. Mid LAD to Dist LAD lesion is 60% stenosed. Dist LAD lesion is 30% stenosed.  1. Left dominant coronary arteries with severe one-vessel coronary artery disease involving proximal LAD with 99% stenosis and mid LAD with 90% stenosis. There is moderate LAD disease in the mid to distal  segment that was left to be treated medically. 2. Left ventricular angiography was not performed due to chronic kidney disease. LVEDP was moderately elevated at 25 mmHg. 3. Successful IVUS guided PCI and drug-eluting stent placement to the mid and proximal LAD.  Recommendations: Dual antiplatelet therapy for at least 1 year. Aggressive treatment of risk factors. Given the  patient's advanced chronic kidney disease, I am going to observe him overnight with hydration. Recheck renal function in the morning. A total of 80 mL of contrast was used for the procedure.  echo 03/01/20 1. Left ventricular ejection fraction, by estimation, is 50 to 55%. The  left ventricle has low normal function. The left ventricle demonstrates  regional wall motion abnormalities (see scoring diagram/findings for  description). Left ventricular diastolic  parameters are consistent with Grade I diastolic dysfunction (impaired  relaxation). There is moderate hypokinesis of the left ventricular, basal  inferior wall.  2. Right ventricular systolic function is normal. The right ventricular  size is normal.  3. The mitral valve is normal in structure. No evidence of mitral valve  regurgitation.  4. The aortic valve is normal in structure. Aortic valve regurgitation is  not visualized. Mild aortic valve sclerosis is present, with no evidence  of aortic valve stenosis.  5. The inferior vena cava is dilated in size with >50% respiratory  variability, suggesting right atrial pressure of 8 mmHg.   )       Anesthesia Quick Evaluation

## 2020-10-30 NOTE — Progress Notes (Signed)
PCP - Fenton Malling, PA-C Cardiologist - [name], MD  Chest x-ray - 02/29/20 EKG - 05/02/20 Stress Test -  ECHO - 2021 Cardiac Cath - 2021  Fasting Blood Sugar:  150s Checks Blood Sugar:  1x/day  Blood Thinner Instructions: clopidogrel (PLAVIX) last dose per pt 3/26 Aspirin Instructions: ASA continue  ERAS Protcol -   COVID TEST- 10/30/20  Anesthesia review: yes  -------------  SDW INSTRUCTIONS:  Your procedure is scheduled on 10/31/20. Please report to [facility] Main Entrance "A" at 0530 A.M., and check in at the Admitting office. Call this number if you have problems the morning of surgery: [phone]   Remember: Do not eat or drink after midnight the night before your surgery   Medications to take morning of surgery with a sip of water include: aspirin atorvastatin (LIPITOR) metoprolol tartrate (LOPRESSOR)   As of today, STOP taking any Aleve, Naproxen, Ibuprofen, Motrin, Advil, Goody's, BC's, all herbal medications, fish oil, and all vitamins.    The Morning of Surgery Do not wear jewelry Do not wear lotions, powders, or colognes, or deodorant Do not shave 48 hours prior to surgery.   Men may shave face and neck. Do not bring valuables to the hospital. [facility] is not responsible for any belongings or valuables. If you are a smoker, DO NOT Smoke 24 hours prior to surgery If you wear a CPAP at night please bring your mask the morning of surgery  Remember that you must have someone to transport you home after your surgery, and remain with you for 24 hours if you are discharged the same day. Please bring cases for contacts, glasses, hearing aids, dentures or bridgework because it cannot be worn into surgery.   Patients discharged the day of surgery will not be allowed to drive home.   Please shower the NIGHT BEFORE SURGERY and the MORNING OF SURGERY with DIAL Soap. Wear comfortable clothes the morning of surgery. Oral Hygiene is also important to  reduce your risk of infection.  Remember - BRUSH YOUR TEETH THE MORNING OF SURGERY WITH YOUR REGULAR TOOTHPASTE  Patient denies shortness of breath, fever, cough and chest pain.

## 2020-10-30 NOTE — Progress Notes (Signed)
Anesthesia Chart Review: Same day workup  Follows with cardiology for history of CAD s/p NSTEMI 02/2020 and DESx2 to LAD 04/24/20, HTN, HLD.  Last seen by Dr. Rockey Situ 07/02/2020.  Per note, doing well, no angina.  Per telephone encounter [phone] he was cleared to be placed on renal transplant list, "Chart reviewed as part of pre-operative protocol coverage. Given past medical history and time since last visit, based on ACC/AHA guidelines, Jeremy Dorsey would be at acceptable risk for the planned procedure without further cardiovascular testing. He may be listed on transplant list.  Stable from cardiac standpoint following most recent PCI. He will need to continue clopidogrel."  Patient completed 36 sessions of cardiac rehab program on 10/03/2020.  DM2, last A1c 7.3 on 05/22/2020.  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  Patient will need day of surgery labs and evaluation.  EKG 05/02/2020: Sinus rhythm first-degree AV block.  Rate 66.  Lateral T wave inversions.  Cath and PCI 04/24/20  Ost RCA to Prox RCA lesion is 30% stenosed.  Ost LAD to Prox LAD lesion is 40% stenosed.  Prox LAD to Mid LAD lesion is 99% stenosed.  Post intervention, there is a 0% residual stenosis.  A drug-eluting stent was successfully placed using a STENT RESOLUTE ONYX 4.0X15.  Mid LAD lesion is 90% stenosed.  Post intervention, there is a 0% residual stenosis.  A drug-eluting stent was successfully placed using a STENT RESOLUTE ONYX 3.0X15.  Mid LAD to Dist LAD lesion is 60% stenosed.  Dist LAD lesion is 30% stenosed.  1. Left dominant coronary arteries with severe one-vessel coronary artery disease involving proximal LAD with 99% stenosis and mid LAD with 90% stenosis. There is moderate LAD disease in the mid to distal segment that was left to be treated medically. 2. Left ventricular angiography was not performed due to chronic kidney disease. LVEDP was moderately elevated at 25 mmHg. 3. Successful IVUS guided PCI and  drug-eluting stent placement to the mid and proximal LAD.  Recommendations: Dual antiplatelet therapy for at least 1 year. Aggressive treatment of risk factors. Given the patient's advanced chronic kidney disease, I am going to observe him overnight with hydration. Recheck renal function in the morning. A total of 80 mL of contrast was used for the procedure.  echo 03/01/20 1. Left ventricular ejection fraction, by estimation, is 50 to 55%. The  left ventricle has low normal function. The left ventricle demonstrates  regional wall motion abnormalities (see scoring diagram/findings for  description). Left ventricular diastolic  parameters are consistent with Grade I diastolic dysfunction (impaired  relaxation). There is moderate hypokinesis of the left ventricular, basal  inferior wall.  2. Right ventricular systolic function is normal. The right ventricular  size is normal.  3. The mitral valve is normal in structure. No evidence of mitral valve  regurgitation.  4. The aortic valve is normal in structure. Aortic valve regurgitation is  not visualized. Mild aortic valve sclerosis is present, with no evidence  of aortic valve stenosis.  5. The inferior vena cava is dilated in size with >50% respiratory  variability, suggesting right atrial pressure of 8 mmHg.     Wynonia Musty [facility]/Anesthesiology Phone [phone] 10/30/2020 2:35 PM

## 2020-10-31 ENCOUNTER — Encounter (HOSPITAL_COMMUNITY)
Admission: RE | Disposition: A | Discharge status: Home / Self Care | Payer: Self-pay | Source: Home / Self Care | Attending: Vascular Surgery

## 2020-10-31 ENCOUNTER — Ambulatory Visit (HOSPITAL_COMMUNITY): Payer: PPO | Admitting: Physician Assistant

## 2020-10-31 ENCOUNTER — Ambulatory Visit (HOSPITAL_COMMUNITY)
Admission: RE | Admit: 2020-10-31 | Discharge: 2020-10-31 | Disposition: A | Discharge status: Home / Self Care | Payer: PPO | Attending: Vascular Surgery | Admitting: Vascular Surgery

## 2020-10-31 ENCOUNTER — Encounter (HOSPITAL_COMMUNITY): Payer: Self-pay | Admitting: Vascular Surgery

## 2020-10-31 DIAGNOSIS — E1122 Type 2 diabetes mellitus with diabetic chronic kidney disease: Secondary | ICD-10-CM | POA: Insufficient documentation

## 2020-10-31 DIAGNOSIS — Z8249 Family history of ischemic heart disease and other diseases of the circulatory system: Secondary | ICD-10-CM | POA: Insufficient documentation

## 2020-10-31 DIAGNOSIS — I2510 Atherosclerotic heart disease of native coronary artery without angina pectoris: Secondary | ICD-10-CM | POA: Insufficient documentation

## 2020-10-31 DIAGNOSIS — Z955 Presence of coronary angioplasty implant and graft: Secondary | ICD-10-CM | POA: Insufficient documentation

## 2020-10-31 DIAGNOSIS — Z87891 Personal history of nicotine dependence: Secondary | ICD-10-CM | POA: Insufficient documentation

## 2020-10-31 DIAGNOSIS — Z7982 Long term (current) use of aspirin: Secondary | ICD-10-CM | POA: Diagnosis not present

## 2020-10-31 DIAGNOSIS — Z7902 Long term (current) use of antithrombotics/antiplatelets: Secondary | ICD-10-CM | POA: Diagnosis not present

## 2020-10-31 DIAGNOSIS — Z7984 Long term (current) use of oral hypoglycemic drugs: Secondary | ICD-10-CM | POA: Insufficient documentation

## 2020-10-31 DIAGNOSIS — I120 Hypertensive chronic kidney disease with stage 5 chronic kidney disease or end stage renal disease: Secondary | ICD-10-CM | POA: Diagnosis not present

## 2020-10-31 DIAGNOSIS — N185 Chronic kidney disease, stage 5: Secondary | ICD-10-CM | POA: Diagnosis not present

## 2020-10-31 DIAGNOSIS — D631 Anemia in chronic kidney disease: Secondary | ICD-10-CM | POA: Diagnosis not present

## 2020-10-31 DIAGNOSIS — Z79899 Other long term (current) drug therapy: Secondary | ICD-10-CM | POA: Insufficient documentation

## 2020-10-31 DIAGNOSIS — E785 Hyperlipidemia, unspecified: Secondary | ICD-10-CM | POA: Insufficient documentation

## 2020-10-31 DIAGNOSIS — Z885 Allergy status to narcotic agent status: Secondary | ICD-10-CM | POA: Insufficient documentation

## 2020-10-31 DIAGNOSIS — N186 End stage renal disease: Secondary | ICD-10-CM | POA: Insufficient documentation

## 2020-10-31 HISTORY — PX: AV FISTULA PLACEMENT: SHX1204

## 2020-10-31 LAB — POCT I-STAT, CHEM 8
BUN: 123 mg/dL — ABNORMAL HIGH (ref 8–23)
Calcium, Ion: 1.29 mmol/L (ref 1.15–1.40)
Chloride: 109 mmol/L (ref 98–111)
Creatinine, Ser: 7 mg/dL — ABNORMAL HIGH (ref 0.61–1.24)
Glucose, Bld: 158 mg/dL — ABNORMAL HIGH (ref 70–99)
HCT: 30 % — ABNORMAL LOW (ref 39.0–52.0)
Hemoglobin: 10.2 g/dL — ABNORMAL LOW (ref 13.0–17.0)
Potassium: 5.2 mmol/L — ABNORMAL HIGH (ref 3.5–5.1)
Sodium: 138 mmol/L (ref 135–145)
TCO2: 18 mmol/L — ABNORMAL LOW (ref 22–32)

## 2020-10-31 LAB — GLUCOSE, CAPILLARY
Glucose-Capillary: 157 mg/dL — ABNORMAL HIGH (ref 70–99)
Glucose-Capillary: 151 mg/dL — ABNORMAL HIGH (ref 70–99)

## 2020-10-31 SURGERY — ARTERIOVENOUS (AV) FISTULA CREATION
Anesthesia: Regional | Site: Arm Lower | Laterality: Left

## 2020-10-31 MED ORDER — PROPOFOL 10 MG/ML IV BOLUS
INTRAVENOUS | Status: AC
  Filled 2020-10-31: qty 40

## 2020-10-31 MED ORDER — PROPOFOL 500 MG/50ML IV EMUL
INTRAVENOUS | Status: DC | PRN
  Administered 2020-10-31: 100 ug/kg/min via INTRAVENOUS

## 2020-10-31 MED ORDER — LIDOCAINE 2% (20 MG/ML) 5 ML SYRINGE
INTRAMUSCULAR | Status: AC
  Filled 2020-10-31: qty 5

## 2020-10-31 MED ORDER — LIDOCAINE-EPINEPHRINE (PF) 1.5 %-1:200000 IJ SOLN
INTRAMUSCULAR | Status: DC | PRN
  Administered 2020-10-31: 30 mL via PERINEURAL

## 2020-10-31 MED ORDER — KETAMINE HCL 50 MG/5ML IJ SOSY
PREFILLED_SYRINGE | INTRAMUSCULAR | Status: AC
  Filled 2020-10-31: qty 5

## 2020-10-31 MED ORDER — ACETAMINOPHEN 325 MG PO TABS
650.0000 mg | ORAL_TABLET | Freq: Once | ORAL | Status: DC
  Filled 2020-10-31: qty 2

## 2020-10-31 MED ORDER — GLYCOPYRROLATE PF 0.2 MG/ML IJ SOSY
PREFILLED_SYRINGE | INTRAMUSCULAR | Status: DC | PRN
  Administered 2020-10-31: .1 mg via INTRAVENOUS

## 2020-10-31 MED ORDER — CEFAZOLIN SODIUM-DEXTROSE 2-4 GM/100ML-% IV SOLN
2.0000 g | INTRAVENOUS | Status: AC
  Administered 2020-10-31: 2 g via INTRAVENOUS
  Filled 2020-10-31: qty 100

## 2020-10-31 MED ORDER — ANTICOAGULANT SODIUM CITRATE 4% (200MG/5ML) IV SOLN
10.0000 mL | Status: DC
  Filled 2020-10-31 (×2): qty 10

## 2020-10-31 MED ORDER — CHLORHEXIDINE GLUCONATE 0.12 % MT SOLN
15.0000 mL | Freq: Once | OROMUCOSAL | Status: AC
  Administered 2020-10-31: 15 mL via OROMUCOSAL
  Filled 2020-10-31: qty 15

## 2020-10-31 MED ORDER — SUCCINYLCHOLINE CHLORIDE 200 MG/10ML IV SOSY
PREFILLED_SYRINGE | INTRAVENOUS | Status: AC
  Filled 2020-10-31: qty 10

## 2020-10-31 MED ORDER — CHLORHEXIDINE GLUCONATE 4 % EX LIQD: 60.0000 mL | Freq: Once | CUTANEOUS | Status: DC

## 2020-10-31 MED ORDER — PHENYLEPHRINE HCL-NACL 10-0.9 MG/250ML-% IV SOLN
INTRAVENOUS | Status: DC | PRN
  Administered 2020-10-31: 20 ug/min via INTRAVENOUS

## 2020-10-31 MED ORDER — KETAMINE HCL 10 MG/ML IJ SOLN
INTRAMUSCULAR | Status: DC | PRN
  Administered 2020-10-31: 10 mg via INTRAVENOUS

## 2020-10-31 MED ORDER — ROCURONIUM BROMIDE 10 MG/ML (PF) SYRINGE
PREFILLED_SYRINGE | INTRAVENOUS | Status: AC
  Filled 2020-10-31: qty 10

## 2020-10-31 MED ORDER — LIDOCAINE-EPINEPHRINE 1 %-1:100000 IJ SOLN
INTRAMUSCULAR | Status: DC | PRN
  Administered 2020-10-31: 4 mL

## 2020-10-31 MED ORDER — SODIUM CHLORIDE 0.9 % IV SOLN: INTRAVENOUS | Status: DC

## 2020-10-31 MED ORDER — MIDAZOLAM HCL 2 MG/2ML IJ SOLN
INTRAMUSCULAR | Status: AC
  Filled 2020-10-31: qty 2

## 2020-10-31 MED ORDER — ORAL CARE MOUTH RINSE: 15.0000 mL | Freq: Once | OROMUCOSAL | Status: AC

## 2020-10-31 MED ORDER — 0.9 % SODIUM CHLORIDE (POUR BTL) OPTIME
TOPICAL | Status: DC | PRN
  Administered 2020-10-31: 1000 mL

## 2020-10-31 MED ORDER — FENTANYL CITRATE (PF) 250 MCG/5ML IJ SOLN
INTRAMUSCULAR | Status: AC
  Filled 2020-10-31: qty 5

## 2020-10-31 MED ORDER — FENTANYL CITRATE (PF) 250 MCG/5ML IJ SOLN
INTRAMUSCULAR | Status: DC | PRN
  Administered 2020-10-31: 50 ug via INTRAVENOUS

## 2020-10-31 MED ORDER — MIDAZOLAM HCL 5 MG/5ML IJ SOLN
INTRAMUSCULAR | Status: DC | PRN
  Administered 2020-10-31: 2 mg via INTRAVENOUS

## 2020-10-31 MED ORDER — SODIUM CHLORIDE 0.9 % IV SOLN
INTRAVENOUS | Status: DC | PRN
  Administered 2020-10-31: 500 mL

## 2020-10-31 MED ORDER — HYDROCODONE-ACETAMINOPHEN 5-325 MG PO TABS: 1.0000 | ORAL_TABLET | ORAL | 0 refills | Status: DC | PRN

## 2020-10-31 SURGICAL SUPPLY — 29 items
ADH SKN CLS APL DERMABOND .7 (GAUZE/BANDAGES/DRESSINGS) ×1
ARMBAND PINK RESTRICT EXTREMIT (MISCELLANEOUS) ×2 IMPLANT
CANISTER SUCT 3000ML PPV (MISCELLANEOUS) ×2 IMPLANT
CLIP VESOCCLUDE MED 6/CT (CLIP) ×2 IMPLANT
CLIP VESOCCLUDE SM WIDE 6/CT (CLIP) ×2 IMPLANT
COVER PROBE W GEL 5X96 (DRAPES) IMPLANT
COVER WAND RF STERILE (DRAPES) ×2 IMPLANT
DERMABOND ADVANCED (GAUZE/BANDAGES/DRESSINGS) ×1
DERMABOND ADVANCED .7 DNX12 (GAUZE/BANDAGES/DRESSINGS) ×1 IMPLANT
ELECT REM PT RETURN 9FT ADLT (ELECTROSURGICAL) ×2
ELECTRODE REM PT RTRN 9FT ADLT (ELECTROSURGICAL) ×1 IMPLANT
GLOVE BIO SURGEON STRL SZ7.5 (GLOVE) ×2 IMPLANT
GOWN STRL REUS W/ TWL LRG LVL3 (GOWN DISPOSABLE) ×2 IMPLANT
GOWN STRL REUS W/ TWL XL LVL3 (GOWN DISPOSABLE) ×1 IMPLANT
GOWN STRL REUS W/TWL LRG LVL3 (GOWN DISPOSABLE) ×4
GOWN STRL REUS W/TWL XL LVL3 (GOWN DISPOSABLE) ×2
INSERT FOGARTY SM (MISCELLANEOUS) IMPLANT
KIT BASIN OR (CUSTOM PROCEDURE TRAY) ×2 IMPLANT
KIT TURNOVER KIT B (KITS) ×2 IMPLANT
NS IRRIG 1000ML POUR BTL (IV SOLUTION) ×2 IMPLANT
PACK CV ACCESS (CUSTOM PROCEDURE TRAY) ×2 IMPLANT
PAD ARMBOARD 7.5X6 YLW CONV (MISCELLANEOUS) ×4 IMPLANT
SUT MNCRL AB 4-0 PS2 18 (SUTURE) ×2 IMPLANT
SUT PROLENE 6 0 BV (SUTURE) ×2 IMPLANT
SUT VIC AB 3-0 SH 27 (SUTURE) ×2
SUT VIC AB 3-0 SH 27X BRD (SUTURE) ×1 IMPLANT
TOWEL GREEN STERILE (TOWEL DISPOSABLE) ×2 IMPLANT
UNDERPAD 30X36 HEAVY ABSORB (UNDERPADS AND DIAPERS) ×2 IMPLANT
WATER STERILE IRR 1000ML POUR (IV SOLUTION) ×2 IMPLANT

## 2020-10-31 NOTE — Transfer of Care (Signed)
Immediate Anesthesia Transfer of Care Note  Patient: Jeremy Dorsey  Procedure(s) Performed: LEFT UPPER EXTREMITY ARTERIOVENOUS (AV) FISTULA CREATION (Left Arm Lower)  Patient Location: PACU  Anesthesia Type:MAC and Regional  Level of Consciousness: drowsy  Airway & Oxygen Therapy: Patient Spontanous Breathing and Patient connected to face mask oxygen  Post-op Assessment: Report given to RN and Post -op Vital signs reviewed and stable  Post vital signs: Reviewed and stable  Last Vitals:  Vitals Value Taken Time  BP 153/56 10/31/20 0854  Temp    Pulse 81 10/31/20 0853  Resp 18 10/31/20 0853  SpO2 100 % 10/31/20 0853  Vitals shown include unvalidated device data.  Last Pain:  Vitals:   10/31/20 0642  TempSrc:   PainSc: 0-No pain         Complications: No complications documented.

## 2020-10-31 NOTE — H&P (Signed)
HPI:  Jeremy Dorsey is a 70 y.o. male right-hand-dominant never been on dialysis.  Recent underwent coronary artery stenting currently on aspirin Plavix.  Risk factors for vascular disease include diabetes, hyperlipidemia, hypertension.  He has never had left upper extremity or chest chest surgery does not have a pacemaker defibrillator.      Past Medical History:  Diagnosis Date  . Anemia   . CKD (chronic kidney disease), stage IV ([place])   . Coronary artery disease    a. s/p IVUS-guided DESx2 to prox & mid LAD, residual disease treated medically. EF 50-55% by recent echo 02/2020.  . 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 Hyperlipidemia LDL goal <70   . Hypertension 2008  . Lower extremity edema   . Shoulder pain    Right  . Urinary complication         Family History  Problem Relation Age of Onset  . Psoriasis Mother   . Heart failure Father         Past Surgical History:  Procedure Laterality Date  . COLONOSCOPY  1990  . COLONOSCOPY  06/09/2011   Dr Bary Castilla  . CORONARY STENT INTERVENTION N/A 04/24/2020   Procedure: CORONARY STENT INTERVENTION;  Surgeon: Wellington Hampshire, MD;  Location: [place] [facility];  Service: Cardiovascular;  Laterality: N/A;  . CYSTOSCOPY W/ RETROGRADES Bilateral 08/01/2018   Procedure: CYSTOSCOPY WITH RETROGRADE PYELOGRAM;  Surgeon: Billey Co, MD;  Location: [facility];  Service: Urology;  Laterality: Bilateral;  . CYSTOSCOPY WITH BIOPSY N/A 08/01/2018   Procedure: CYSTOSCOPY WITH Bladder BIOPSY;  Surgeon: Billey Co, MD;  Location: [facility];  Service: Urology;  Laterality: N/A;  . CYSTOSCOPY WITH STENT PLACEMENT Right 08/01/2018   Procedure: CYSTOSCOPY WITH STENT PLACEMENT;  Surgeon: Billey Co, MD;  Location: [facility] OR[facility];  Service: Urology;  Laterality: Right;  . EYE SURGERY Right    laser surgery  . INTRAVASCULAR ULTRASOUND/IVUS N/A 04/24/2020   Procedure: Intravascular Ultrasound/IVUS;   Surgeon: Wellington Hampshire, MD;  Location: [place] [facility];  Service: Cardiovascular;  Laterality: N/A;  . LEFT HEART CATH AND CORONARY ANGIOGRAPHY N/A 04/24/2020   Procedure: LEFT HEART CATH AND CORONARY ANGIOGRAPHY;  Surgeon: Wellington Hampshire, MD;  Location: [place] [facility];  Service: Cardiovascular;  Laterality: N/A;  . SHOULDER ARTHROSCOPY WITH OPEN ROTATOR CUFF REPAIR Right 08/25/2018   Procedure: SHOULDER ARTHROSCOPY WITH MINI OPEN ROTATOR CUFF REPAIR;  Surgeon: [place], MD;  Location: [facility];  Service: Orthopedics;  Laterality: Right;  . TRANSURETHRAL RESECTION OF BLADDER TUMOR N/A 08/01/2018   Procedure: TRANSURETHRAL RESECTION OF BLADDER TUMOR (TURBT);  Surgeon: Billey Co, MD;  Location: [facility];  Service: Urology;  Laterality: N/A;  . URETEROSCOPY WITH HOLMIUM LASER LITHOTRIPSY Right 08/01/2018   Procedure: URETEROSCOPY WITH HOLMIUM LASER LITHOTRIPSY;  Surgeon: Billey Co, MD;  Location: [facility];  Service: Urology;  Laterality: Right;    Short Social History:  Social History        Tobacco Use  . Smoking status: Former Smoker    Packs/day: 1.00    Years: 30.00    Pack years: 30.00    Types: Cigarettes    Quit date: 08/23/2007    Years since quitting: 12.8  . Smokeless tobacco: Never Used  Substance Use Topics  . Alcohol use: No        Allergies  Allergen Reactions  . Oxycodone Nausea And Vomiting  . Hydrocodone Nausea And Vomiting  Current Outpatient Medications  Medication Sig Dispense Refill  . aspirin 81 MG EC tablet Take 1 tablet (81 mg total) by mouth daily. Swallow whole. 30 tablet 11  . atorvastatin (LIPITOR) 80 MG tablet Take 1 tablet (80 mg total) by mouth daily. 90 tablet 1  . calcitRIOL (ROCALTROL) 0.25 MCG capsule Take 0.5 mcg by mouth daily. (Prescription instructions say to take 2 capsules by mouth daily)    . clopidogrel (PLAVIX) 75 MG tablet Take 1 tablet (75 mg total) by mouth daily with  breakfast. 90 tablet 1  . furosemide (LASIX) 40 MG tablet Take 40 mg by mouth 2 (two) times daily.    . metoprolol tartrate (LOPRESSOR) 25 MG tablet Take 1 tablet (25 mg total) by mouth 2 (two) times daily. 180 tablet 1  . nitroGLYCERIN (NITROSTAT) 0.4 MG SL tablet Place 1 tablet (0.4 mg total) under the tongue every 5 (five) minutes x 3 doses as needed for chest pain. 25 tablet 12  . sitaGLIPtin (JANUVIA) 25 MG tablet Take 1 tablet (25 mg total) by mouth daily. 30 tablet 3  . sodium bicarbonate 650 MG tablet Take 650 mg by mouth 2 (two) times daily.     . sodium zirconium cyclosilicate (LOKELMA) 10 g PACK packet Take 10 g by mouth 3 (three) times a week.     No current facility-administered medications for this visit.    Review of Systems  Constitutional:  Constitutional negative. HENT: HENT negative.  Eyes: Eyes negative.  Respiratory: Respiratory negative.  Cardiovascular: Cardiovascular negative.  GI: Gastrointestinal negative.  Musculoskeletal: Musculoskeletal negative.  Neurological: Neurological negative. Hematologic: Hematologic/lymphatic negative.  Psychiatric: Psychiatric negative.        Objective:   Vitals:   10/31/20 0600 10/31/20 0601  BP: 136/75 136/75  Pulse: 75 75  Resp: 18 18  Temp:  97.9 F (36.6 C)  SpO2: 100% 100%     Physical Exam HENT:     Head: Normocephalic.     Nose:     Comments: Wearing a mask Eyes:     Pupils: Pupils are equal, round, and reactive to light.  Cardiovascular:     Rate and Rhythm: Normal rate.     Pulses: Normal pulses.  Pulmonary:     Effort: Pulmonary effort is normal.  Abdominal:     General: Abdomen is flat.     Palpations: Abdomen is soft.  Musculoskeletal:        General: No swelling. Normal range of motion.     Cervical back: Neck supple.  Skin:    General: Skin is warm and dry.     Capillary Refill: Capillary refill takes less than 2 seconds.  Neurological:     General: No focal deficit  present.     Mental Status: He is alert.  Psychiatric:        Mood and Affect: Mood normal.        Behavior: Behavior normal.        Thought Content: Thought content normal.        Judgment: Judgment normal.     Data: I have independently interpreted his bilateral upper extremity vein mapping.  He appears to have suitable right cephalic vein throughout the upper extremity and suitable basilic vein above the antecubitum.  On the left side he has suitable cephalic vein throughout and basilic vein also above the antecubital space.  I have independently interpreted his upper extremity with patent bilateral brachial arteries on the right 0.52 cm and left 0.45 cm.  Radial artery on the right 0.27 and left 0.27 cm     Assessment/Plan:      70 year old male with chronic kidney disease.  We discussed his options for dialysis access which include catheter, fistula, graft.  He appears to have suitable vein in his nondominant left upper extremity.  We will plan for a left upper extremity fistula today in OR.     Waynetta Sandy MD [facility]

## 2020-10-31 NOTE — Discharge Instructions (Signed)
Call Vascular office for appointment  [phone]

## 2020-10-31 NOTE — Anesthesia Postprocedure Evaluation (Signed)
Anesthesia Post Note  Patient: Jeremy Dorsey  Procedure(s) Performed: LEFT UPPER EXTREMITY ARTERIOVENOUS (AV) FISTULA CREATION (Left Arm Lower)     Patient location during evaluation: PACU Anesthesia Type: Regional Level of consciousness: awake Pain management: pain level controlled Vital Signs Assessment: post-procedure vital signs reviewed and stable Respiratory status: spontaneous breathing, nonlabored ventilation, respiratory function stable and patient connected to nasal cannula oxygen Cardiovascular status: stable and blood pressure returned to baseline Postop Assessment: no apparent nausea or vomiting Anesthetic complications: no   No complications documented.  Last Vitals:  Vitals:   10/31/20 0908 10/31/20 0922  BP: (!) 150/56 (!) 141/78  Pulse: 76 77  Resp: 20 (!) 22  Temp:  36.6 C  SpO2: 96% 98%    Last Pain:  Vitals:   10/31/20 0922  TempSrc:   PainSc: 0-No pain                 Danee Soller P Sharif Rendell

## 2020-10-31 NOTE — Op Note (Signed)
    Patient name: Jeremy Dorsey MRN: [id number] DOB: Nov 15, 1950 Sex: male  10/31/2020 Pre-operative Diagnosis: Chronic kidney disease Post-operative diagnosis:  Same Surgeon:  Eda Paschal. Donzetta Matters, MD. Assistant: Ethelene Hal, MS 3 Procedure Performed:  Left radial artery to cephalic vein AV fistula creation  Indications: 70 year old male not currently on dialysis with history of chronic kidney disease.  He is right-hand dominant he is indicated for left arm AV access creation.  Findings: The cephalic vein at the wrist measured approximately 4 mm external diameter.  This was dilated easily to 3-1/2 mm.  The radial artery did have some disease was approximately 2-1/2 mm.  After anastomosis there is pulsatility in the vein we are able to trace flow with Doppler until the mid forearm.  There is a very strong ulnar signal that does not augment with compression of the fistula.   Procedure:  The patient was identified in the holding area and taken to the operating was put supine on upper table.  A preoperative block of been placed.  He was sterilely prepped draped in left upper extremity antibiotics were ministered timeout was called.  MAC anesthesia was induced.  We use ultrasound to identify his left cephalic vein and radial artery at the wrist.  We then used 1% lidocaine given that the block did not appear to be fully keeping him comfortable.  A transverse incision was made.  We first dissected out the vein.  Branches were divided between clips and ties.  The vein was marked for orientation.  We took care to preserve the large nerves that were identifiable.  We then dissected through the deep fascia to the radial artery.  A vessel loop was placed around this.  The vein was then doubly clipped distally and transected.  Was serially dilated to 3.5 mm flushed with heparinized saline clamped.  The radial artery was then clamped distally and proximally opened longitudinally.  I did attempt to flush distally given  the small size it was somewhat difficult.  I then spatulated the vein sewn into side with 6-0 Prolene suture.  Prior completion low flushing all directions.  Point completion the vein did get much larger after releasing our clamps.  We freed up some soft tissue and divided branches higher in the forearm.  We did trace flow with Doppler to the mid forearm.  We then thoroughly irrigated our wound hemostasis and closed in layers with Vicryl and Monocryl.  He was then awakened from anesthesia having tolerated procedure well without immediate complication.  Counts were correct at completion.  EBL: 20 cc    Ladavia Lindenbaum C. Donzetta Matters, MD [facility] [place] Office: [phone] Pager: [phone]

## 2020-10-31 NOTE — Anesthesia Procedure Notes (Signed)
Anesthesia Regional Block: Supraclavicular block   Pre-Anesthetic Checklist: ,, timeout performed, Correct Patient, Correct Site, Correct Laterality, Correct Procedure, Correct Position, site marked, Risks and benefits discussed,  Surgical consent,  Pre-op evaluation,  At surgeon's request and post-op pain management  Laterality: Left  Prep: chloraprep       Needles:  Injection technique: Single-shot  Needle Type: Echogenic Stimulator Needle     Needle Length: 9cm  Needle Gauge: 21     Additional Needles:   Procedures:,,,, ultrasound used (permanent image in chart),,,,  Narrative:  Start time: 10/31/2020 7:15 AM End time: 10/31/2020 7:25 AM Injection made incrementally with aspirations every 5 mL.  Performed by: Personally  Anesthesiologist: Murvin Natal, MD  Additional Notes: Functioning IV was confirmed and monitors were applied.  A timeout was performed. Sterile prep, hand hygiene and sterile gloves were used. A 23mm 21ga Arrow echogenic stimulator needle was used. Negative aspiration and negative test dose prior to incremental administration of local anesthetic. The patient tolerated the procedure well.  Ultrasound guidance: relevent anatomy identified, needle position confirmed, local anesthetic spread visualized around nerve(s), vascular puncture avoided.  Image printed for medical record.

## 2020-11-01 ENCOUNTER — Encounter (HOSPITAL_COMMUNITY): Payer: Self-pay | Admitting: Vascular Surgery

## 2020-11-06 ENCOUNTER — Ambulatory Visit (INDEPENDENT_AMBULATORY_CARE_PROVIDER_SITE_OTHER): Payer: PPO | Admitting: Physician Assistant

## 2020-11-06 ENCOUNTER — Encounter: Payer: Self-pay | Admitting: Physician Assistant

## 2020-11-06 DIAGNOSIS — E1159 Type 2 diabetes mellitus with other circulatory complications: Secondary | ICD-10-CM

## 2020-11-06 DIAGNOSIS — N185 Chronic kidney disease, stage 5: Secondary | ICD-10-CM

## 2020-11-06 DIAGNOSIS — I25110 Atherosclerotic heart disease of native coronary artery with unstable angina pectoris: Secondary | ICD-10-CM | POA: Diagnosis not present

## 2020-11-06 NOTE — Progress Notes (Signed)
Established patient visit  Virtual Visit via Video Note  I connected with Jeremy Dorsey on 11/06/20 at  2:00 PM EDT by a video enabled telemedicine application and verified that I am speaking with the correct person using two identifiers.  Location: Patient: Home Provider: [facility]   I discussed the limitations of evaluation and management by telemedicine and the availability of in person appointments. The patient expressed understanding and agreed to proceed.   Patient: Jeremy Dorsey   DOB: 03/14/1951   70 y.o. Male  MRN: [id number] Visit Date: 11/06/2020  Today's healthcare provider: Mar Daring, PA-C   No chief complaint on file.  Subjective    HPI  AWE was on 05/29/2020 w/ [place] Diabetes Mellitus Type II, follow-up  Lab Results  Component Value Date   HGBA1C 7.3 (A) 05/22/2020   HGBA1C 7.3 (H) 03/01/2020   HGBA1C 8.0 (A) 06/10/2018   Last seen for diabetes 6 months ago.  Management since then includes continuing the same treatment. He reports excellent compliance with treatment. He is not having side effects.   Home blood sugar records: stable  Episodes of hypoglycemia? No    Current insulin regiment: None Most Recent Eye Exam: UTD  --------------------------------------------------------------------------------------------------- Hypertension, follow-up  BP Readings from Last 3 Encounters:  10/31/20 (!) 141/78  07/12/20 (!) 159/96  07/02/20 140/72   Wt Readings from Last 3 Encounters:  10/31/20 225 lb (102.1 kg)  07/12/20 230 lb (104.3 kg)  07/02/20 231 lb 6 oz (105 kg)     He was last seen for hypertension 6 months ago.  BP at that visit was 140/72. Management since that visit includes no changes, patient followed by Cardiology. He reports excellent compliance with treatment. He is not having side effects.  He is not exercising. He is adherent to low salt diet.   Outside blood pressures are normal.  He does not smoke.  Use  of agents associated with hypertension: none.   --------------------------------------------------------------------------------------------------- Lipid/Cholesterol, follow-up  Last Lipid Panel: Lab Results  Component Value Date   CHOL 137 03/02/2020   LDLCALC 88 03/02/2020   HDL 28 (L) 03/02/2020   TRIG 106 03/02/2020    He was last seen for this 6 months ago.  Management since that visit includes no change.  He reports excellent compliance with treatment. He is not having side effects.   Symptoms: No appetite changes No foot ulcerations  No chest pain No chest pressure/discomfort  No dyspnea No orthopnea  Yes fatigue No lower extremity edema  No palpitations No paroxysmal nocturnal dyspnea  No nausea No numbness or tingling of extremity  No polydipsia No polyuria  No speech difficulty No syncope   He is following a Low Sodium diet. Current exercise: none  Last metabolic panel Lab Results  Component Value Date   GLUCOSE 158 (H) 10/31/2020   NA 138 10/31/2020   K 5.2 (H) 10/31/2020   BUN 123 (H) 10/31/2020   CREATININE 7.00 (H) 10/31/2020   GFRNONAA 10 (L) 04/29/2020   GFRAA 12 (L) 04/29/2020   CALCIUM 8.9 04/29/2020   AST 14 (L) 08/03/2018   ALT 15 08/03/2018   The ASCVD Risk score Mikey Bussing DC Jr., et al., 2013) failed to calculate for the following reasons:   The patient has a prior MI or stroke diagnosis  ---------------------------------------------------------------------------------------------------   Patient Active Problem List   Diagnosis Date Noted  . Unstable angina ([place]) 04/24/2020  . CAD (coronary artery disease), native coronary artery 04/23/2020  .  Acute non-Q wave non-ST elevation myocardial infarction (NSTEMI) ([place]) 03/01/2020  . Cataracts, bilateral 04/29/2017  . Kidney stones 04/29/2017  . Malignant melanoma of face excluding eyelid, nose, lip, and ear ([place]) 04/29/2017  . Obesity (BMI 35.0-39.9 without comorbidity) 04/29/2017  .  Pre-transplant evaluation for CKD (chronic kidney disease) 04/29/2017  . Secondary hyperparathyroidism of renal origin ([place]) 04/29/2017  . Type 2 diabetes mellitus, without long-term current use of insulin ([place]) 08/11/2016  . CKD (chronic kidney disease), stage V ([place]) 02/26/2015  . HLD (hyperlipidemia) 02/26/2015  . Essential hypertension 02/26/2015   Past Medical History:  Diagnosis Date  . Anemia   . CKD (chronic kidney disease), stage IV ([place])   . Coronary artery disease    a. s/p IVUS-guided DESx2 to prox & mid LAD, residual disease treated medically. EF 50-55% by recent echo 02/2020.  . 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 Hyperlipidemia LDL goal <70   . Hypertension 2008  . Lower extremity edema   . Shoulder pain    Right  . Urinary complication        Medications: Outpatient Medications Prior to Visit  Medication Sig  . aspirin 81 MG EC tablet Take 1 tablet (81 mg total) by mouth daily. Swallow whole.  Marland Kitchen atorvastatin (LIPITOR) 80 MG tablet Take 1 tablet (80 mg total) by mouth daily.  . calcitRIOL (ROCALTROL) 0.25 MCG capsule Take 0.5 mcg by mouth daily.  . clopidogrel (PLAVIX) 75 MG tablet Take 1 tablet (75 mg total) by mouth daily with breakfast.  . furosemide (LASIX) 40 MG tablet Take 40 mg by mouth 2 (two) times daily.  . metoprolol tartrate (LOPRESSOR) 25 MG tablet Take 1 tablet (25 mg total) by mouth 2 (two) times daily.  . nitroGLYCERIN (NITROSTAT) 0.4 MG SL tablet Place 1 tablet (0.4 mg total) under the tongue every 5 (five) minutes x 3 doses as needed for chest pain.  . sitaGLIPtin (JANUVIA) 25 MG tablet Take 1 tablet (25 mg total) by mouth daily. (Patient taking differently: Take 25 mg by mouth daily with breakfast.)  . sodium bicarbonate 650 MG tablet Take 650 mg by mouth 2 (two) times daily.   Marland Kitchen HYDROcodone-acetaminophen (NORCO/VICODIN) 5-325 MG tablet Take 1 tablet by mouth every 4 (four) hours as needed for moderate pain.   No facility-administered  medications prior to visit.    Review of Systems  Last CBC Lab Results  Component Value Date   WBC 8.9 04/29/2020   HGB 10.2 (L) 10/31/2020   HCT 30.0 (L) 10/31/2020   MCV 88.3 04/29/2020   MCH 30.3 04/29/2020   RDW 12.2 04/29/2020   PLT 158 92/42/6834   Last metabolic panel Lab Results  Component Value Date   GLUCOSE 158 (H) 10/31/2020   NA 138 10/31/2020   K 5.2 (H) 10/31/2020   CL 109 10/31/2020   CO2 18 (L) 04/29/2020   BUN 123 (H) 10/31/2020   CREATININE 7.00 (H) 10/31/2020   GFRNONAA 10 (L) 04/29/2020   GFRAA 12 (L) 04/29/2020   CALCIUM 8.9 04/29/2020   PHOS 4.2 04/07/2016   PROT 7.2 08/03/2018   ALBUMIN 4.4 08/03/2018   BILITOT 0.5 08/03/2018   ALKPHOS 125 08/03/2018   AST 14 (L) 08/03/2018   ALT 15 08/03/2018   ANIONGAP 10 04/29/2020       Objective    There were no vitals taken for this visit.     Physical Exam Vitals reviewed.  Constitutional:      General: He is not  in acute distress.    Appearance: Normal appearance. He is well-developed. He is not ill-appearing.  HENT:     Head: Normocephalic and atraumatic.  Eyes:     Conjunctiva/sclera: Conjunctivae normal.  Pulmonary:     Effort: Pulmonary effort is normal. No respiratory distress.  Musculoskeletal:     Cervical back: Normal range of motion and neck supple.  Neurological:     Mental Status: He is alert.  Psychiatric:        Mood and Affect: Mood normal.        Behavior: Behavior normal.        Thought Content: Thought content normal.        Judgment: Judgment normal.      No results found for any visits on 11/06/20.  Assessment & Plan     1. Coronary artery disease involving native coronary artery of native heart with unstable angina pectoris ([place]) Stable currently. Followed by Dr. Rockey Situ. Continue medications as prescribed by Cardiology.   2. Type 2 diabetes mellitus with other circulatory complication, without long-term current use of insulin (HCC) Stable on Januvia  25mg .  3. CKD (chronic kidney disease), stage V ([place]) Just had fistula placed last week. Followed by Dr. Moshe Cipro, [facility]. On transplant list with [facility] as well.    No follow-ups on file.      I discussed the assessment and treatment plan with the patient. The patient was provided an opportunity to ask questions and all were answered. The patient agreed with the plan and demonstrated an understanding of the instructions.   The patient was advised to call back or seek an in-person evaluation if the symptoms worsen or if the condition fails to improve as anticipated.  I provided 13 minutes of face-to-face time during this encounter via MyChart Video enabled encounter.   Reynolds Bowl, PA-C, have reviewed all documentation for this visit. The documentation on 11/06/20 for the exam, diagnosis, procedures, and orders are all accurate and complete.   Rubye Beach  [facility] [phone] (phone) [phone] (fax)  [place]

## 2020-11-13 DIAGNOSIS — N3080 Other cystitis without hematuria: Secondary | ICD-10-CM | POA: Diagnosis not present

## 2020-11-13 DIAGNOSIS — I2510 Atherosclerotic heart disease of native coronary artery without angina pectoris: Secondary | ICD-10-CM | POA: Diagnosis not present

## 2020-11-13 DIAGNOSIS — I129 Hypertensive chronic kidney disease with stage 1 through stage 4 chronic kidney disease, or unspecified chronic kidney disease: Secondary | ICD-10-CM | POA: Diagnosis not present

## 2020-11-13 DIAGNOSIS — Z6835 Body mass index (BMI) 35.0-35.9, adult: Secondary | ICD-10-CM | POA: Diagnosis not present

## 2020-11-13 DIAGNOSIS — E875 Hyperkalemia: Secondary | ICD-10-CM | POA: Diagnosis not present

## 2020-11-13 DIAGNOSIS — E1122 Type 2 diabetes mellitus with diabetic chronic kidney disease: Secondary | ICD-10-CM | POA: Diagnosis not present

## 2020-11-13 DIAGNOSIS — D7218 Eosinophilia in diseases classified elsewhere: Secondary | ICD-10-CM | POA: Diagnosis not present

## 2020-11-13 DIAGNOSIS — N2581 Secondary hyperparathyroidism of renal origin: Secondary | ICD-10-CM | POA: Diagnosis not present

## 2020-11-13 DIAGNOSIS — N184 Chronic kidney disease, stage 4 (severe): Secondary | ICD-10-CM | POA: Diagnosis not present

## 2020-11-13 DIAGNOSIS — R809 Proteinuria, unspecified: Secondary | ICD-10-CM | POA: Diagnosis not present

## 2020-11-30 ENCOUNTER — Other Ambulatory Visit: Payer: Self-pay | Admitting: Family

## 2020-11-30 DIAGNOSIS — I25118 Atherosclerotic heart disease of native coronary artery with other forms of angina pectoris: Secondary | ICD-10-CM

## 2020-12-01 ENCOUNTER — Other Ambulatory Visit: Payer: Self-pay | Admitting: Family

## 2020-12-01 DIAGNOSIS — E785 Hyperlipidemia, unspecified: Secondary | ICD-10-CM

## 2020-12-01 DIAGNOSIS — I25118 Atherosclerotic heart disease of native coronary artery with other forms of angina pectoris: Secondary | ICD-10-CM

## 2020-12-05 ENCOUNTER — Other Ambulatory Visit: Payer: Self-pay

## 2020-12-05 DIAGNOSIS — N185 Chronic kidney disease, stage 5: Secondary | ICD-10-CM

## 2020-12-11 NOTE — Progress Notes (Signed)
POST OPERATIVE OFFICE NOTE    CC:  F/u for surgery  HPI:  This is a 70 y.o. male who is s/p left RC AVF on 10/31/2020 by Dr. Donzetta Matters.   Pt states he does have some numbness around the incision onto the thumb on the left hand.  He will occasionally have some numbness in the tips of the left thumb 1st and 2nd fingers.  He does not have any pain in the left hand  The pt is not on dialysis.  His nephrologist is Dr. Moshe Cipro    Allergies  Allergen Reactions  . Oxycodone Nausea And Vomiting  . Hydrocodone Nausea And Vomiting    Current Outpatient Medications  Medication Sig Dispense Refill  . aspirin 81 MG EC tablet Take 1 tablet (81 mg total) by mouth daily. Swallow whole. 30 tablet 11  . atorvastatin (LIPITOR) 80 MG tablet TAKE 1 TABLET BY MOUTH EVERY DAY 90 tablet 0  . calcitRIOL (ROCALTROL) 0.25 MCG capsule Take 0.5 mcg by mouth daily.    . clopidogrel (PLAVIX) 75 MG tablet TAKE 1 TABLET (75 MG TOTAL) BY MOUTH DAILY WITH BREAKFAST. 90 tablet 0  . furosemide (LASIX) 40 MG tablet Take 40 mg by mouth 2 (two) times daily.    . metoprolol tartrate (LOPRESSOR) 25 MG tablet TAKE 1 TABLET BY MOUTH TWICE A DAY 180 tablet 0  . nitroGLYCERIN (NITROSTAT) 0.4 MG SL tablet Place 1 tablet (0.4 mg total) under the tongue every 5 (five) minutes x 3 doses as needed for chest pain. 25 tablet 12  . sitaGLIPtin (JANUVIA) 25 MG tablet Take 1 tablet (25 mg total) by mouth daily. (Patient taking differently: Take 25 mg by mouth daily with breakfast.) 30 tablet 3  . sodium bicarbonate 650 MG tablet Take 650 mg by mouth 2 (two) times daily.      No current facility-administered medications for this visit.     ROS:  See HPI  Physical Exam:  Today's Vitals   12/20/20 0917  BP: (!) 142/80  Pulse: 67  Resp: 20  Temp: 98.1 F (36.7 C)  TempSrc: Temporal  SpO2: 100%  Weight: 229 lb 14.4 oz (104.3 kg)  Height: 5\' 10"  (1.778 m)   Body mass index is 32.99 kg/m.   Incision:  Healed  nicely Extremities:   There is a palpable left radial pulse.   Motor and sensory are in tact.   There is a thrill/bruit present.  The fistula is easily palpable   Dialysis Duplex on 12/11/2020: Diameter:  0.47-0.63cm Depth:  0.34-0.36cm -there is a competing branch that loops to the cephalic at the proximal forearm.  Assessment/Plan:  This is a 70 y.o. male who is s/p: left RC AVF on 10/31/2020 by Dr. Donzetta Matters.    -the pt does have some occasional numbness in the tips of the thumb, 1st and 2nd fingers but this is not bothersome.  Otherwise, he does not have any other steal sx.   -he does have some numbness around the incision onto the thumb and this is most likely due to nerve irritation and hopefully it will get better with time. -the fistula/graft can be used 01/31/2021. -discussed with pt that there is an area in the proximal forearm that is .60mm with a competing branch.  Anticipate that this will mature but if they go to use the fistula, he is aware that if there are any issues, he may need a fistulogram or branch ligation.  He also understands that the fistula won't last  forever and will need interventions or even new access in the future.   -the pt will follow up as needed.   Leontine Locket, [facility] [phone]  Clinic MD:  Donzetta Matters

## 2020-12-12 LAB — PSA: PSA: 0.8

## 2020-12-12 LAB — HEMOGLOBIN A1C: Hemoglobin A1C: 7.3

## 2020-12-12 LAB — BASIC METABOLIC PANEL
BUN: 70 — AB (ref 4–21)
CO2: 15 (ref 13–22)
Chloride: 112 — AB (ref 99–108)
Creatinine: 5.4 — AB (ref 0.6–1.3)
Glucose: 140
Potassium: 4.8 (ref 3.4–5.3)
Sodium: 138 (ref 137–147)

## 2020-12-12 LAB — HEPATIC FUNCTION PANEL
ALT: 21 (ref 10–40)
AST: 14 (ref 14–40)
Alkaline Phosphatase: 82 (ref 25–125)
Bilirubin, Total: 0.4

## 2020-12-12 LAB — LIPID PANEL
Cholesterol: 81 (ref 0–200)
HDL: 25 — AB (ref 35–70)
LDL Cholesterol: 39
Triglycerides: 89 (ref 40–160)

## 2020-12-12 LAB — COMPREHENSIVE METABOLIC PANEL
Albumin: 3.9 (ref 3.5–5.0)
Calcium: 9.4 (ref 8.7–10.7)
GFR calc non Af Amer: 11

## 2020-12-12 LAB — HM HEPATITIS C SCREENING LAB: HM Hepatitis Screen: NEGATIVE

## 2020-12-20 ENCOUNTER — Encounter (HOSPITAL_COMMUNITY): Payer: PPO

## 2020-12-20 ENCOUNTER — Other Ambulatory Visit: Payer: Self-pay

## 2020-12-20 ENCOUNTER — Ambulatory Visit (INDEPENDENT_AMBULATORY_CARE_PROVIDER_SITE_OTHER): Payer: PPO | Admitting: Physician Assistant

## 2020-12-20 ENCOUNTER — Ambulatory Visit (HOSPITAL_COMMUNITY)
Admission: RE | Admit: 2020-12-20 | Discharge: 2020-12-20 | Disposition: A | Discharge status: Home / Self Care | Payer: PPO | Source: Ambulatory Visit | Attending: Vascular Surgery | Admitting: Vascular Surgery

## 2020-12-20 VITALS — BP 142/80 | HR 67 | Temp 98.1°F | Resp 20 | Ht 70.0 in | Wt 229.9 lb

## 2020-12-20 DIAGNOSIS — N185 Chronic kidney disease, stage 5: Secondary | ICD-10-CM

## 2020-12-30 NOTE — Progress Notes (Signed)
Cardiology Office Note  Date:  12/31/2020   ID:  Jeremy Dorsey, DOB [birth year], MRN [id number]  PCP:  Mar Daring, PA-C   Chief Complaint  Patient presents with  . 6 month follow up     Patient c/o blood in urine and is wondering if could be the Plavix. Medications reviewed by the patient verbally.     HPI:  [name] is a 70 year old gentleman with past medical history of Coronary artery disease hypertension diabetes type 2 Chronic kidney disease stage IV Morbid obesity Smoker, stopped 2009 Hyperlipidemia Presenting to the hospital March 01, 2020 with arm and jaw pain, non-STEMI Catheterization deferred secondary to concern for contrast nephropathy Peak troponin 2,200 He presents for f/u of  his coronary disease, s/p cath  Periodic hematuria, has not had it worked up Reports having kidney stones in the past Renal failure, has new AV fistula left wrist Lab work reviewed with him Cr 4.2  No chest pain,  No leg swelling, no PND orthopnea, no abdominal distention  inactive kidney transplant list, needs to lose 30 pounds Discussed his frustrations with trying to get on the list  Lab work reviewed Creatinine 4.76 BUN 62 Total cholesterol 81 LDL 39 Hemoglobin A1c 7.3  EKG personally reviewed by myself on todays visit Shows normal sinus rhythm rate 74 bpm nonspecific ST abnormality  Cath 04/24/2020  Ost RCA to Prox RCA lesion is 30% stenosed.  Ost LAD to Prox LAD lesion is 40% stenosed.  Prox LAD to Mid LAD lesion is 99% stenosed.  Post intervention, there is a 0% residual stenosis.  A drug-eluting stent was successfully placed using a STENT RESOLUTE ONYX 4.0X15.  Mid LAD lesion is 90% stenosed.  Post intervention, there is a 0% residual stenosis.  A drug-eluting stent was successfully placed using a STENT RESOLUTE ONYX 3.0X15.  Mid LAD to Dist LAD lesion is 60% stenosed.  Dist LAD lesion is 30% stenosed.   1.  Left dominant coronary  arteries with severe one-vessel coronary artery disease involving proximal LAD with 99% stenosis and mid LAD with 90% stenosis.  There is moderate LAD disease in the mid to distal segment that was left to be treated medically. 2.  Left ventricular angiography was not performed due to chronic kidney disease.  LVEDP was moderately elevated at 25 mmHg. 3.  Successful IVUS guided PCI and drug-eluting stent placement to the mid and proximal LAD.  Stress test March 02, 2020  . Potential small area of pharmacologically induced ischemia involving the apex of the left ventricle.  2. No scintigraphic evidence of prior infarction. 3. Mild global hypokinesia, slightly more conspicuously involving the basilar aspect of the inferior wall of the left ventricle. 4. Ejection fraction - 47%.  PMH:   has a past medical history of Anemia, CKD (chronic kidney disease), stage IV ([place]), Coronary artery disease, Diabetes mellitus with nephropathy ([place]) (2008), Hyperlipidemia LDL goal <70, Hypertension (2008), Lower extremity edema, Shoulder pain, and Urinary complication.  PSH:    Past Surgical History:  Procedure Laterality Date  . AV FISTULA PLACEMENT Left 10/31/2020   Procedure: LEFT UPPER EXTREMITY ARTERIOVENOUS (AV) FISTULA CREATION;  Surgeon: Waynetta Sandy, MD;  Location: [place];  Service: Vascular;  Laterality: Left;  . COLONOSCOPY  1990  . COLONOSCOPY  06/09/2011   Dr Bary Castilla  . CORONARY STENT INTERVENTION N/A 04/24/2020   Procedure: CORONARY STENT INTERVENTION;  Surgeon: Wellington Hampshire, MD;  Location: [place] [facility];  Service: Cardiovascular;  Laterality: N/A;  . CYSTOSCOPY W/ RETROGRADES Bilateral 08/01/2018   Procedure: CYSTOSCOPY WITH RETROGRADE PYELOGRAM;  Surgeon: Billey Co, MD;  Location: [facility];  Service: Urology;  Laterality: Bilateral;  . CYSTOSCOPY WITH BIOPSY N/A 08/01/2018   Procedure: CYSTOSCOPY WITH Bladder BIOPSY;  Surgeon: Billey Co, MD;  Location: [facility];   Service: Urology;  Laterality: N/A;  . CYSTOSCOPY WITH STENT PLACEMENT Right 08/01/2018   Procedure: CYSTOSCOPY WITH STENT PLACEMENT;  Surgeon: Billey Co, MD;  Location: [facility];  Service: Urology;  Laterality: Right;  . EYE SURGERY Right    laser surgery  . INTRAVASCULAR ULTRASOUND/IVUS N/A 04/24/2020   Procedure: Intravascular Ultrasound/IVUS;  Surgeon: Wellington Hampshire, MD;  Location: [place] [facility];  Service: Cardiovascular;  Laterality: N/A;  . LEFT HEART CATH AND CORONARY ANGIOGRAPHY N/A 04/24/2020   Procedure: LEFT HEART CATH AND CORONARY ANGIOGRAPHY;  Surgeon: Wellington Hampshire, MD;  Location: [place] [facility];  Service: Cardiovascular;  Laterality: N/A;  . SHOULDER ARTHROSCOPY WITH OPEN ROTATOR CUFF REPAIR Right 08/25/2018   Procedure: SHOULDER ARTHROSCOPY WITH MINI OPEN ROTATOR CUFF REPAIR;  Surgeon: Thornton Park, MD;  Location: [facility];  Service: Orthopedics;  Laterality: Right;  . TRANSURETHRAL RESECTION OF BLADDER TUMOR N/A 08/01/2018   Procedure: TRANSURETHRAL RESECTION OF BLADDER TUMOR (TURBT);  Surgeon: Billey Co, MD;  Location: [facility];  Service: Urology;  Laterality: N/A;  . URETEROSCOPY WITH HOLMIUM LASER LITHOTRIPSY Right 08/01/2018   Procedure: URETEROSCOPY WITH HOLMIUM LASER LITHOTRIPSY;  Surgeon: Billey Co, MD;  Location: [facility];  Service: Urology;  Laterality: Right;    Current Outpatient Medications  Medication Sig Dispense Refill  . aspirin 81 MG EC tablet Take 1 tablet (81 mg total) by mouth daily. Swallow whole. 30 tablet 11  . atorvastatin (LIPITOR) 80 MG tablet TAKE 1 TABLET BY MOUTH EVERY DAY 90 tablet 0  . calcitRIOL (ROCALTROL) 0.25 MCG capsule Take 0.5 mcg by mouth daily.    . clopidogrel (PLAVIX) 75 MG tablet TAKE 1 TABLET (75 MG TOTAL) BY MOUTH DAILY WITH BREAKFAST. 90 tablet 0  . furosemide (LASIX) 40 MG tablet Take 40 mg by mouth 2 (two) times daily.    . metoprolol tartrate (LOPRESSOR) 25 MG tablet TAKE 1 TABLET BY  MOUTH TWICE A DAY 180 tablet 0  . nitroGLYCERIN (NITROSTAT) 0.4 MG SL tablet Place 1 tablet (0.4 mg total) under the tongue every 5 (five) minutes x 3 doses as needed for chest pain. 25 tablet 12  . sitaGLIPtin (JANUVIA) 25 MG tablet Take 1 tablet (25 mg total) by mouth daily. 30 tablet 3  . sodium bicarbonate 650 MG tablet Take 650 mg by mouth 2 (two) times daily.      No current facility-administered medications for this visit.     Allergies:   Oxycodone and Hydrocodone   Social History:  The patient  reports that he quit smoking about 13 years ago. His smoking use included cigarettes. He has a 30.00 pack-year smoking history. He has never used smokeless tobacco. He reports that he does not drink alcohol and does not use drugs.   Family History:   family history includes Heart failure in his father; Psoriasis in his mother.    Review of Systems: Review of Systems  Constitutional: Negative.   HENT: Negative.   Respiratory: Negative.   Cardiovascular: Negative.   Gastrointestinal: Negative.   Musculoskeletal: Negative.   Neurological: Negative.   Psychiatric/Behavioral: Negative.   All other systems reviewed and are  negative.   PHYSICAL EXAM: VS:  BP 130/70 (BP Location: Left Arm, Patient Position: Sitting, Cuff Size: Normal)   Pulse 79   Ht 5\' 9"  (1.753 m)   Wt 222 lb (100.7 kg)   SpO2 98%   BMI 32.78 kg/m  , BMI Body mass index is 32.78 kg/m. GEN: Well nourished, well developed, in no acute distress obese HEENT: normal Neck: no JVD, carotid bruits, or masses Cardiac: RRR; no murmurs, rubs, or gallops,no edema  Respiratory:  clear to auscultation bilaterally, normal work of breathing GI: soft, nontender, nondistended, + BS MS: no deformity or atrophy Skin: warm and dry, no rash Neuro:  Strength and sensation are intact Psych: euthymic mood, full affect   Recent Labs: 04/29/2020: Platelets 158 10/31/2020: BUN 123; Creatinine, Ser 7.00; Hemoglobin 10.2; Potassium 5.2;  Sodium 138    Lipid Panel Lab Results  Component Value Date   CHOL 137 03/02/2020   HDL 28 (L) 03/02/2020   LDLCALC 88 03/02/2020   TRIG 106 03/02/2020      Wt Readings from Last 3 Encounters:  12/31/20 222 lb (100.7 kg)  12/20/20 229 lb 14.4 oz (104.3 kg)  10/31/20 225 lb (102.1 kg)     ASSESSMENT AND PLAN:  Problem List Items Addressed This Visit      Cardiology Problems   Essential hypertension   CAD (coronary artery disease), native coronary artery - Primary   Relevant Orders   EKG 12-Lead     Other   CKD (chronic kidney disease), stage V (HCC)   Type 2 diabetes mellitus, without long-term current use of insulin (HCC)   Relevant Orders   EKG 12-Lead    Other Visit Diagnoses    Hyperlipidemia LDL goal <70         Unstable angina Stent x2 placed in the LAD proximal mid region Continue aspirin Plavix  moderate residual disease mid LAD Denies angina, no further testing at this time  Diabetes type 2 with complications We have encouraged continued exercise, careful diet management in an effort to lose weight.  Essential hypertension Blood pressure is well controlled on today's visit. No changes made to the medications.  Hyperlipidemia On Lipitor 80 daily  Cholesterol is at goal on the current lipid regimen. No changes to the medications were made.  End-stage renal disease Followed by nephrology Followed renal txpl team, needs weight loss Discussed his frustrations with getting on the list    Signed, Esmond Plants, M.D., Ph.D. [place], [place]

## 2020-12-31 ENCOUNTER — Ambulatory Visit: Payer: PPO | Admitting: Cardiovascular Disease

## 2020-12-31 ENCOUNTER — Encounter: Payer: Self-pay | Admitting: Cardiovascular Disease

## 2020-12-31 ENCOUNTER — Other Ambulatory Visit: Payer: Self-pay

## 2020-12-31 VITALS — BP 130/70 | HR 79 | Ht 69.0 in | Wt 222.0 lb

## 2020-12-31 DIAGNOSIS — N185 Chronic kidney disease, stage 5: Secondary | ICD-10-CM

## 2020-12-31 DIAGNOSIS — E785 Hyperlipidemia, unspecified: Secondary | ICD-10-CM

## 2020-12-31 DIAGNOSIS — E1159 Type 2 diabetes mellitus with other circulatory complications: Secondary | ICD-10-CM

## 2020-12-31 DIAGNOSIS — I25118 Atherosclerotic heart disease of native coronary artery with other forms of angina pectoris: Secondary | ICD-10-CM

## 2020-12-31 DIAGNOSIS — I10 Essential (primary) hypertension: Secondary | ICD-10-CM

## 2020-12-31 NOTE — Patient Instructions (Signed)
Medication Instructions:  No changes  If you need a refill on your cardiac medications before your next appointment, please call your pharmacy.    Lab work: No new labs needed   If you have labs (blood work) drawn today and your tests are completely normal, you will receive your results only by: . MyChart Message (if you have MyChart) OR . A paper copy in the mail If you have any lab test that is abnormal or we need to change your treatment, we will call you to review the results.   Testing/Procedures: No new testing needed   Follow-Up: At [facility], you and your health needs are our priority.  As part of our continuing mission to provide you with exceptional heart care, we have created designated Provider Care Teams.  These Care Teams include your primary Cardiologist (physician) and Advanced Practice Providers (APPs -  Physician Assistants and Nurse Practitioners) who all work together to provide you with the care you need, when you need it.  . You will need a follow up appointment in 12 months  . Providers on your designated Care Team:   . Christopher Berge, NP . Ryan Dunn, PA-C . Jacquelyn Visser, PA-C  Any Other Special Instructions Will Be Listed Below (If Applicable).  COVID-19 Vaccine Information can be found at: [email or web address] For questions related to vaccine distribution or appointments, please email [email or web address] or call [phone].     

## 2021-01-02 DIAGNOSIS — E113393 Type 2 diabetes mellitus with moderate nonproliferative diabetic retinopathy without macular edema, bilateral: Secondary | ICD-10-CM | POA: Diagnosis not present

## 2021-01-02 LAB — HM DIABETES EYE EXAM

## 2021-01-16 DIAGNOSIS — N184 Chronic kidney disease, stage 4 (severe): Secondary | ICD-10-CM | POA: Diagnosis not present

## 2021-01-16 DIAGNOSIS — E875 Hyperkalemia: Secondary | ICD-10-CM | POA: Diagnosis not present

## 2021-01-16 DIAGNOSIS — N2581 Secondary hyperparathyroidism of renal origin: Secondary | ICD-10-CM | POA: Diagnosis not present

## 2021-01-16 DIAGNOSIS — D7218 Eosinophilia in diseases classified elsewhere: Secondary | ICD-10-CM | POA: Diagnosis not present

## 2021-01-16 DIAGNOSIS — R809 Proteinuria, unspecified: Secondary | ICD-10-CM | POA: Diagnosis not present

## 2021-01-16 DIAGNOSIS — I2510 Atherosclerotic heart disease of native coronary artery without angina pectoris: Secondary | ICD-10-CM | POA: Diagnosis not present

## 2021-01-16 DIAGNOSIS — E1122 Type 2 diabetes mellitus with diabetic chronic kidney disease: Secondary | ICD-10-CM | POA: Diagnosis not present

## 2021-01-16 DIAGNOSIS — I129 Hypertensive chronic kidney disease with stage 1 through stage 4 chronic kidney disease, or unspecified chronic kidney disease: Secondary | ICD-10-CM | POA: Diagnosis not present

## 2021-01-16 DIAGNOSIS — Z6835 Body mass index (BMI) 35.0-35.9, adult: Secondary | ICD-10-CM | POA: Diagnosis not present

## 2021-01-16 DIAGNOSIS — N3080 Other cystitis without hematuria: Secondary | ICD-10-CM | POA: Diagnosis not present

## 2021-01-23 ENCOUNTER — Encounter: Payer: Self-pay | Admitting: Cardiovascular Disease

## 2021-02-26 ENCOUNTER — Other Ambulatory Visit: Payer: Self-pay | Admitting: Family

## 2021-02-26 DIAGNOSIS — I25118 Atherosclerotic heart disease of native coronary artery with other forms of angina pectoris: Secondary | ICD-10-CM

## 2021-02-26 NOTE — Telephone Encounter (Signed)
Rx(s) sent to pharmacy electronically.  

## 2021-02-28 ENCOUNTER — Other Ambulatory Visit: Payer: Self-pay | Admitting: Family

## 2021-02-28 DIAGNOSIS — E785 Hyperlipidemia, unspecified: Secondary | ICD-10-CM

## 2021-02-28 DIAGNOSIS — I25118 Atherosclerotic heart disease of native coronary artery with other forms of angina pectoris: Secondary | ICD-10-CM

## 2021-03-18 DIAGNOSIS — N3080 Other cystitis without hematuria: Secondary | ICD-10-CM | POA: Diagnosis not present

## 2021-03-18 DIAGNOSIS — E1122 Type 2 diabetes mellitus with diabetic chronic kidney disease: Secondary | ICD-10-CM | POA: Diagnosis not present

## 2021-03-18 DIAGNOSIS — I129 Hypertensive chronic kidney disease with stage 1 through stage 4 chronic kidney disease, or unspecified chronic kidney disease: Secondary | ICD-10-CM | POA: Diagnosis not present

## 2021-03-18 DIAGNOSIS — E875 Hyperkalemia: Secondary | ICD-10-CM | POA: Diagnosis not present

## 2021-03-18 DIAGNOSIS — D7218 Eosinophilia in diseases classified elsewhere: Secondary | ICD-10-CM | POA: Diagnosis not present

## 2021-03-18 DIAGNOSIS — I2510 Atherosclerotic heart disease of native coronary artery without angina pectoris: Secondary | ICD-10-CM | POA: Diagnosis not present

## 2021-03-18 DIAGNOSIS — N2581 Secondary hyperparathyroidism of renal origin: Secondary | ICD-10-CM | POA: Diagnosis not present

## 2021-03-18 DIAGNOSIS — Z6835 Body mass index (BMI) 35.0-35.9, adult: Secondary | ICD-10-CM | POA: Diagnosis not present

## 2021-03-18 DIAGNOSIS — R809 Proteinuria, unspecified: Secondary | ICD-10-CM | POA: Diagnosis not present

## 2021-03-18 DIAGNOSIS — N184 Chronic kidney disease, stage 4 (severe): Secondary | ICD-10-CM | POA: Diagnosis not present

## 2021-03-24 ENCOUNTER — Encounter: Payer: Self-pay | Admitting: Cardiovascular Disease

## 2021-04-15 ENCOUNTER — Telehealth: Payer: Self-pay

## 2021-04-15 NOTE — Telephone Encounter (Signed)
Copied from [place] [phone]. Topic: General - Other >> Apr 14, 2021  5:55 PM Pawlus, Brayton Layman A wrote: Reason for CRM: [facility] called in to see if the pts Pathology reports could be faxed over.

## 2021-04-16 NOTE — Telephone Encounter (Signed)
[facility] calling again to follow up on a medical record request they faxed over, please advise.

## 2021-04-17 ENCOUNTER — Encounter (HOSPITAL_BASED_OUTPATIENT_CLINIC_OR_DEPARTMENT_OTHER): Payer: Self-pay

## 2021-04-17 NOTE — Telephone Encounter (Signed)
Do you have the medical record request?  Thanks,   -Mickel Baas

## 2021-04-17 NOTE — Telephone Encounter (Signed)
They are faxing request for immunizations. Other request were sent to [facility].

## 2021-05-20 DIAGNOSIS — E875 Hyperkalemia: Secondary | ICD-10-CM | POA: Diagnosis not present

## 2021-05-20 DIAGNOSIS — D7218 Eosinophilia in diseases classified elsewhere: Secondary | ICD-10-CM | POA: Diagnosis not present

## 2021-05-20 DIAGNOSIS — N3080 Other cystitis without hematuria: Secondary | ICD-10-CM | POA: Diagnosis not present

## 2021-05-20 DIAGNOSIS — I129 Hypertensive chronic kidney disease with stage 1 through stage 4 chronic kidney disease, or unspecified chronic kidney disease: Secondary | ICD-10-CM | POA: Diagnosis not present

## 2021-05-20 DIAGNOSIS — Z6835 Body mass index (BMI) 35.0-35.9, adult: Secondary | ICD-10-CM | POA: Diagnosis not present

## 2021-05-20 DIAGNOSIS — R809 Proteinuria, unspecified: Secondary | ICD-10-CM | POA: Diagnosis not present

## 2021-05-20 DIAGNOSIS — I2510 Atherosclerotic heart disease of native coronary artery without angina pectoris: Secondary | ICD-10-CM | POA: Diagnosis not present

## 2021-05-20 DIAGNOSIS — E1122 Type 2 diabetes mellitus with diabetic chronic kidney disease: Secondary | ICD-10-CM | POA: Diagnosis not present

## 2021-05-20 DIAGNOSIS — N2581 Secondary hyperparathyroidism of renal origin: Secondary | ICD-10-CM | POA: Diagnosis not present

## 2021-05-20 DIAGNOSIS — N184 Chronic kidney disease, stage 4 (severe): Secondary | ICD-10-CM | POA: Diagnosis not present

## 2021-05-20 LAB — PTH, INTACT: PTH, Intact: 145

## 2021-05-21 LAB — HEMOGLOBIN A1C: Hemoglobin A1C: 6.2

## 2021-05-30 ENCOUNTER — Encounter: Payer: Self-pay | Admitting: Family Medicine

## 2021-05-30 ENCOUNTER — Other Ambulatory Visit: Payer: Self-pay

## 2021-05-30 ENCOUNTER — Ambulatory Visit (INDEPENDENT_AMBULATORY_CARE_PROVIDER_SITE_OTHER): Payer: PPO | Admitting: Family Medicine

## 2021-05-30 VITALS — BP 134/83 | HR 74 | Temp 98.3°F | Resp 18 | Ht 69.0 in | Wt 227.0 lb

## 2021-05-30 DIAGNOSIS — I10 Essential (primary) hypertension: Secondary | ICD-10-CM

## 2021-05-30 DIAGNOSIS — E1159 Type 2 diabetes mellitus with other circulatory complications: Secondary | ICD-10-CM | POA: Diagnosis not present

## 2021-05-30 DIAGNOSIS — Z289 Immunization not carried out for unspecified reason: Secondary | ICD-10-CM | POA: Diagnosis not present

## 2021-05-30 DIAGNOSIS — Z23 Encounter for immunization: Secondary | ICD-10-CM | POA: Diagnosis not present

## 2021-05-30 DIAGNOSIS — I25110 Atherosclerotic heart disease of native coronary artery with unstable angina pectoris: Secondary | ICD-10-CM

## 2021-05-30 DIAGNOSIS — N185 Chronic kidney disease, stage 5: Secondary | ICD-10-CM

## 2021-05-30 MED ORDER — TETANUS-DIPHTH-ACELL PERTUSSIS 5-2.5-18.5 LF-MCG/0.5 IM SUSY: 0.5000 mL | PREFILLED_SYRINGE | Freq: Once | INTRAMUSCULAR | 0 refills | Status: AC

## 2021-05-30 MED ORDER — SHINGRIX 50 MCG/0.5ML IM SUSR: 0.5000 mL | Freq: Once | INTRAMUSCULAR | 0 refills | Status: AC

## 2021-05-30 NOTE — Progress Notes (Signed)
I,Jeremy Dorsey,acting as a scribe for Jeremy Huh, MD.,have documented all relevant documentation on the behalf of Jeremy Huh, MD,as directed by  Jeremy Huh, MD while in the presence of Jeremy Huh, MD.   Established patient visit   Patient: Jeremy Dorsey   DOB: [birth year]   70 y.o. Male  MRN: [id number] Visit Date: 05/30/2021  Today's healthcare provider: [name], MD   Chief Complaint  Patient presents with   Follow-up   Subjective    HPI  Diabetes Mellitus Type II, follow-up  Lab Results  Component Value Date   HGBA1C 6.2 05/20/2021   HGBA1C 7.3 12/12/2020   HGBA1C 7.3 (A) 05/22/2020   Lab Results  Component Value Date   CHOL 81 12/12/2020   HDL 25 (A) 12/12/2020   LDLCALC 39 12/12/2020   TRIG 89 12/12/2020   CHOLHDL 4.9 03/02/2020   BMET    Component Value Date/Time   NA 138 12/12/2020 0000   K 4.8 12/12/2020 0000   CL 112 (A) 12/12/2020 0000   CO2 15 12/12/2020 0000   GLUCOSE 158 (H) 10/31/2020 0623   BUN 70 (A) 12/12/2020 0000   CREATININE 5.4 (A) 12/12/2020 0000   CREATININE 7.00 (H) 10/31/2020 0623   CALCIUM 9.4 12/12/2020 0000   GFRNONAA 11 12/12/2020 0000    Last seen for diabetes 1 years ago.  Management since then includes; A1c stable at 7.3. Continue Januvia 25mg .  Since then he has had several follow up with his nephrologist who had him discontinue Januvia. A1c earlier this month was 6.2%. Is now on no diabetes medications.  He reports good compliance with treatment. He is not having side effects. none  Home blood sugar records: fasting range: not checking  Episodes of hypoglycemia? No none   Current insulin regiment: n/a Most Recent Eye Exam:  Dr. Gloriann Loan last year  ---------------------------------------------------------------------------------------------------     Medications: Outpatient Medications Prior to Visit  Medication Sig   aspirin 81 MG EC tablet Take 1 tablet (81 mg total) by mouth daily. Swallow  whole.   atorvastatin (LIPITOR) 80 MG tablet TAKE 1 TABLET BY MOUTH EVERY DAY   calcitRIOL (ROCALTROL) 0.25 MCG capsule Take 0.5 mcg by mouth daily.   clopidogrel (PLAVIX) 75 MG tablet TAKE 1 TABLET BY MOUTH DAILY WITH BREAKFAST.   furosemide (LASIX) 40 MG tablet Take 40 mg by mouth 3 (three) times daily.   metoprolol tartrate (LOPRESSOR) 25 MG tablet TAKE 1 TABLET BY MOUTH TWICE A DAY   nitroGLYCERIN (NITROSTAT) 0.4 MG SL tablet Place 1 tablet (0.4 mg total) under the tongue every 5 (five) minutes x 3 doses as needed for chest pain.   sodium bicarbonate 650 MG tablet Take 650 mg by mouth 2 (two) times daily.    sitaGLIPtin (JANUVIA) 25 MG tablet Take 1 tablet (25 mg total) by mouth daily.   No facility-administered medications prior to visit.    Review of Systems  Constitutional:  Negative for appetite change, chills and fever.  Respiratory:  Negative for chest tightness, shortness of breath and wheezing.   Cardiovascular:  Negative for chest pain and palpitations.  Gastrointestinal:  Negative for abdominal pain, nausea and vomiting.      Objective    BP 134/83 (BP Location: Right Arm, Patient Position: Sitting, Cuff Size: Large)   Pulse 74   Temp 98.3 F (36.8 C) (Temporal)   Resp 18   Ht 5\' 9"  (1.753 m)   Wt 227 lb (103 kg)   SpO2  98%   BMI 33.52 kg/m  {Show previous vital signs (optional):23777}  Physical Exam    General: Appearance:    Mildly obese male in no acute distress  Eyes:    PERRL, conjunctiva/corneas clear, EOM's intact       Lungs:     Clear to auscultation bilaterally, respirations unlabored  Heart:    Normal heart rate. Normal rhythm. No murmurs, rubs, or gallops.    MS:   All extremities are intact.    Neurologic:   Awake, alert, oriented x 3. No apparent focal neurological defect.          Assessment & Plan     1. Type 2 diabetes mellitus with other circulatory complication, without long-term current use of insulin (HCC) Diet controlled.   2.  Essential hypertension Well controlled.  Continue current medications.    3. CKD (chronic kidney disease), stage V ([place]) Continue routine follow up nephrology.   4. Need for influenza vaccination  - Flu Vaccine QUAD High Dose(Fluad)  5. Prescription for Shingrix. Vaccine not administered in office.   - Zoster Vaccine Adjuvanted [facility]) injection; Inject 0.5 mLs into the muscle once for 1 dose. Repeat after 2 months  Dispense: 0.5 mL; Refill: 0  6. Prescription for Tdap. Vaccine not administered in office.   - Tdap ([place]) 5-2.5-18.5 LF-MCG/0.5 injection; Inject 0.5 mLs into the muscle once for 1 dose.  Dispense: 0.5 mL; Refill: 0  7. Coronary artery disease involving native coronary artery of native heart with unstable angina pectoris (HCC) Asymptomatic. Compliant with medication.  Continue aggressive risk factor modification.        The entirety of the information documented in the History of Present Illness, Review of Systems and Physical Exam were personally obtained by me. Portions of this information were initially documented by the CMA and reviewed by me for thoroughness and accuracy.     Jeremy Huh, MD  [facility] [phone] (phone) [phone] (fax)  [place]

## 2021-05-30 NOTE — Patient Instructions (Signed)
.   Please review the attached list of medications and notify my office if there are any errors.   . Please bring all of your medications to every appointment so we can make sure that our medication list is the same as yours.   

## 2021-06-09 ENCOUNTER — Other Ambulatory Visit: Payer: Self-pay

## 2021-06-09 DIAGNOSIS — N185 Chronic kidney disease, stage 5: Secondary | ICD-10-CM

## 2021-06-19 ENCOUNTER — Ambulatory Visit (INDEPENDENT_AMBULATORY_CARE_PROVIDER_SITE_OTHER): Payer: PPO

## 2021-06-19 DIAGNOSIS — Z0000 Encounter for general adult medical examination without abnormal findings: Secondary | ICD-10-CM

## 2021-06-19 NOTE — Progress Notes (Signed)
Virtual Visit via Telephone Note  I connected with  Jeremy Dorsey on 06/19/21 at  9:40 AM EST by telephone and verified that I am speaking with the correct person using two identifiers.  Location: Patient: home  Provider: [facility] Persons participating in the virtual visit: [place]   I discussed the limitations, risks, security and privacy concerns of performing an evaluation and management service by telephone and the availability of in person appointments. The patient expressed understanding and agreed to proceed.  Interactive audio and video telecommunications were attempted between this nurse and patient, however failed, due to patient having technical difficulties OR patient did not have access to video capability.  We continued and completed visit with audio only.  Some vital signs may be absent or patient reported.   Dionisio David, LPN   Subjective:   Jeremy Dorsey is a 70 y.o. male who presents for Medicare Annual/Subsequent preventive examination.  Review of Systems           Objective:    There were no vitals filed for this visit. There is no height or weight on file to calculate BMI.  Advanced Directives 06/19/2021 10/31/2020 05/29/2020 04/24/2020 03/21/2020 02/29/2020 05/24/2019  Does Patient Have a Medical Advance Directive? No No No No Yes No No;Yes  Type of Advance Directive - - - - Press photographer;Living will - [place];Living will  Does patient want to make changes to medical advance directive? - - No - Patient declined - No - Patient declined - -  Copy of [place] in Chart? - - - - - - No - copy requested  Would patient like information on creating a medical advance directive? No - Patient declined No - Patient declined No - Patient declined No - Patient declined No - Patient declined No - Patient declined -    Current Medications (verified) Outpatient Encounter Medications as of  06/19/2021  Medication Sig   aspirin 81 MG EC tablet Take 1 tablet (81 mg total) by mouth daily. Swallow whole.   atorvastatin (LIPITOR) 80 MG tablet TAKE 1 TABLET BY MOUTH EVERY DAY   calcitRIOL (ROCALTROL) 0.25 MCG capsule Take 0.5 mcg by mouth daily.   clopidogrel (PLAVIX) 75 MG tablet TAKE 1 TABLET BY MOUTH DAILY WITH BREAKFAST.   furosemide (LASIX) 40 MG tablet Take 40 mg by mouth 3 (three) times daily.   metoprolol tartrate (LOPRESSOR) 25 MG tablet TAKE 1 TABLET BY MOUTH TWICE A DAY   nitroGLYCERIN (NITROSTAT) 0.4 MG SL tablet Place 1 tablet (0.4 mg total) under the tongue every 5 (five) minutes x 3 doses as needed for chest pain.   sodium bicarbonate 650 MG tablet Take 650 mg by mouth 2 (two) times daily.    No facility-administered encounter medications on file as of 06/19/2021.    Allergies (verified) Oxycodone and Hydrocodone   History: Past Medical History:  Diagnosis Date   Anemia    CKD (chronic kidney disease), stage IV (HCC)    Coronary artery disease    a. s/p IVUS-guided DESx2 to prox & mid LAD, residual disease treated medically. EF 50-55% by recent echo 02/2020.   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   Hyperlipidemia LDL goal <70    Hypertension 2008   Lower extremity edema    Shoulder pain    Right   Urinary complication    Past Surgical History:  Procedure Laterality Date   AV FISTULA PLACEMENT Left 10/31/2020  Procedure: LEFT UPPER EXTREMITY ARTERIOVENOUS (AV) FISTULA CREATION;  Surgeon: Waynetta Sandy, MD;  Location: [facility];  Service: Vascular;  Laterality: Left;   COLONOSCOPY  1990   COLONOSCOPY  06/09/2011   Dr Bary Castilla   CORONARY STENT INTERVENTION N/A 04/24/2020   Procedure: CORONARY STENT INTERVENTION;  Surgeon: Wellington Hampshire, MD;  Location: [place] [facility];  Service: Cardiovascular;  Laterality: N/A;   CYSTOSCOPY W/ RETROGRADES Bilateral 08/01/2018   Procedure: CYSTOSCOPY WITH RETROGRADE PYELOGRAM;  Surgeon: Billey Co, MD;  Location: [facility];  Service: Urology;  Laterality: Bilateral;   CYSTOSCOPY WITH BIOPSY N/A 08/01/2018   Procedure: CYSTOSCOPY WITH Bladder BIOPSY;  Surgeon: Billey Co, MD;  Location: [facility];  Service: Urology;  Laterality: N/A;   CYSTOSCOPY WITH STENT PLACEMENT Right 08/01/2018   Procedure: CYSTOSCOPY WITH STENT PLACEMENT;  Surgeon: Billey Co, MD;  Location: [facility];  Service: Urology;  Laterality: Right;   EYE SURGERY Right    laser surgery   INTRAVASCULAR ULTRASOUND/IVUS N/A 04/24/2020   Procedure: Intravascular Ultrasound/IVUS;  Surgeon: Wellington Hampshire, MD;  Location: [facility] [facility];  Service: Cardiovascular;  Laterality: N/A;   LEFT HEART CATH AND CORONARY ANGIOGRAPHY N/A 04/24/2020   Procedure: LEFT HEART CATH AND CORONARY ANGIOGRAPHY;  Surgeon: Wellington Hampshire, MD;  Location: [place] [facility];  Service: Cardiovascular;  Laterality: N/A;   SHOULDER ARTHROSCOPY WITH OPEN ROTATOR CUFF REPAIR Right 08/25/2018   Procedure: SHOULDER ARTHROSCOPY WITH MINI OPEN ROTATOR CUFF REPAIR;  Surgeon: Thornton Park, MD;  Location: [facility];  Service: Orthopedics;  Laterality: Right;   TRANSURETHRAL RESECTION OF BLADDER TUMOR N/A 08/01/2018   Procedure: TRANSURETHRAL RESECTION OF BLADDER TUMOR (TURBT);  Surgeon: Billey Co, MD;  Location: [facility];  Service: Urology;  Laterality: N/A;   URETEROSCOPY WITH HOLMIUM LASER LITHOTRIPSY Right 08/01/2018   Procedure: URETEROSCOPY WITH HOLMIUM LASER LITHOTRIPSY;  Surgeon: Billey Co, MD;  Location: [facility];  Service: Urology;  Laterality: Right;   Family History  Problem Relation Age of Onset   Psoriasis Mother    Heart failure Father    Social History   Socioeconomic History   Marital status: Married    Spouse name: Not on file   Number of children: 3   Years of education: Not on file   Highest education level: Bachelor's degree (e.g., BA, AB, BS)  Occupational History    Comment: retired  Tobacco Use    Smoking status: Former    Packs/day: 1.00    Years: 30.00    Pack years: 30.00    Types: Cigarettes    Quit date: 08/23/2007    Years since quitting: 13.8   Smokeless tobacco: Never  Vaping Use   Vaping Use: Never used  Substance and Sexual Activity   Alcohol use: No   Drug use: No   Sexual activity: Yes    Birth control/protection: None  Other Topics Concern   Not on file  Social History Narrative   Not on file   Social Determinants of Health   Financial Resource Strain: Low Risk    Difficulty of Paying Living Expenses: Not hard at all  Food Insecurity: No Food Insecurity   Worried About Charity fundraiser in the Last Year: Never true   [place] in the Last Year: Never true  Transportation Needs: No Transportation Needs   Lack of Transportation (Medical): No   Lack of Transportation (Non-Medical): No  Physical Activity: Insufficiently Active  Days of Exercise per Week: 3 days   Minutes of Exercise per Session: 30 min  Stress: No Stress Concern Present   Feeling of Stress : Not at all  Social Connections: Moderately Integrated   Frequency of Communication with Friends and Family: More than three times a week   Frequency of Social Gatherings with Friends and Family: Three times a week   Attends Religious Services: 1 to 4 times per year   Active Member of Clubs or Organizations: No   Attends Archivist Meetings: Never   Marital Status: Married    Tobacco Counseling Counseling given: Not Answered   Clinical Intake:  Pre-visit preparation completed: Yes  Pain : No/denies pain     Nutritional Risks: None Diabetes: Yes CBG done?: No Did pt. bring in CBG monitor from home?: No  How often do you need to have someone help you when you read instructions, pamphlets, or other written materials from your doctor or pharmacy?: 1 - Never  Diabetic?yes Nutrition Risk Assessment:  Has the patient had any N/V/D within the last 2 months?  No  Does the  patient have any non-healing wounds?  No  Has the patient had any unintentional weight loss or weight gain?  No   Diabetes:  Is the patient diabetic?  Yes  If diabetic, was a CBG obtained today?  No  Did the patient bring in their glucometer from home?  No  How often do you monitor your CBG's? Once a week.   Financial Strains and Diabetes Management:  Are you having any financial strains with the device, your supplies or your medication? No .  Does the patient want to be seen by Chronic Care Management for management of their diabetes?  No  Would the patient like to be referred to a Nutritionist or for Diabetic Management?  No   Diabetic Exams:  Diabetic Eye Exam: Completed 2022.   Diabetic Foot Exam: Completed 05/17/20. Pt has been advised about the importance in completing this exam.     Interpreter Needed?: No  Information entered by :: Kirke Shaggy, LPN   Activities of Daily Living In your present state of health, do you have any difficulty performing the following activities: 05/30/2021 11/06/2020  Hearing? N N  Vision? N N  Difficulty concentrating or making decisions? N N  Walking or climbing stairs? N N  Dressing or bathing? N N  Doing errands, shopping? N N  Some recent data might be hidden    Patient Care Team: Mikey Kirschner, PA-C as PCP - General (Physician Assistant) Minna Merritts, MD as PCP - Cardiology (Cardiology) Corliss Parish, MD as Consulting Physician (Nephrology) Lorelee Cover., MD (Ophthalmology) Dasher, Rayvon Char, MD (Dermatology)  Indicate any recent Medical Services you may have received from other than [facility] providers in the past year (date may be approximate).     Assessment:   This is a routine wellness examination for [place].  Hearing/Vision screen No results found.  Dietary issues and exercise activities discussed:     Goals Addressed             This Visit's Progress    DIET - EAT MORE FRUITS AND VEGETABLES          Depression Screen PHQ 2/9 Scores 06/19/2021 06/19/2021 05/30/2021 11/06/2020 09/24/2020 05/07/2020 03/26/2020  PHQ - 2 Score 0 0 0 0 0 0 2  PHQ- 9 Score 0 0 1 0 1 3 10     Fall Risk Fall Risk  06/19/2021 05/30/2021 05/29/2020 03/21/2020 02/27/2020  Falls in the past year? 0 0 0 0 0  Comment - - - - Emmi Telephone Survey: data to providers prior to load  Number falls in past yr: 0 0 0 0 -  Comment - - - - -  Injury with Fall? 0 0 0 0 -  Risk for fall due to : No Fall Risks Impaired balance/gait;History of fall(s) - No Fall Risks -  Follow up Falls prevention discussed Falls evaluation completed - Falls evaluation completed;Education provided;Falls prevention discussed -    FALL RISK PREVENTION PERTAINING TO THE HOME:  Any stairs in or around the home? Yes  If so, are there any without handrails? No  Home free of loose throw rugs in walkways, pet beds, electrical cords, etc? Yes  Adequate lighting in your home to reduce risk of falls? Yes   ASSISTIVE DEVICES UTILIZED TO PREVENT FALLS:  Life alert? No  Use of a cane, walker or w/c? No  Grab bars in the bathroom? Yes  Shower chair or bench in shower? Yes  Elevated toilet seat or a handicapped toilet? No   TIMED UP AND GO:  Was the test performed? No .   Cognitive Function:Normal cognitive status assessed by direct observation by this Nurse Health Advisor. No abnormalities found.        Immunizations Immunization History  Administered Date(s) Administered   Fluad Quad(high Dose 65+) 05/24/2019, 05/22/2020, 05/30/2021   Influenza Split 06/07/2006   Influenza, High Dose Seasonal PF 06/10/2018   Influenza,inj,Quad PF,6+ Mos 07/25/2013, 09/06/2014   Influenza-Unspecified 05/24/2019, 05/22/2020   PFIZER Comirnaty(Gray Top)Covid-19 Tri-Sucrose Vaccine 09/26/2019, 10/17/2019   PFIZER(Purple Top)SARS-COV-2 Vaccination 09/26/2019, 10/17/2019   Pneumococcal Conjugate-13 05/24/2019   Pneumococcal Polysaccharide-23 06/14/2004,  05/22/2020   Zoster, Live 01/23/2011    TDAP status: Due, Education has been provided regarding the importance of this vaccine. Advised may receive this vaccine at local pharmacy or Health Dept. Aware to provide a copy of the vaccination record if obtained from local pharmacy or Health Dept. Verbalized acceptance and understanding.  Flu Vaccine status: Up to date  Pneumococcal vaccine status: Up to date  Covid-19 vaccine status: Completed vaccines  Qualifies for Shingles Vaccine? Yes   Zostavax completed Yes   Shingrix Completed?: No.    Education has been provided regarding the importance of this vaccine. Patient has been advised to call insurance company to determine out of pocket expense if they have not yet received this vaccine. Advised may also receive vaccine at local pharmacy or Health Dept. Verbalized acceptance and understanding.  Screening Tests Health Maintenance  Topic Date Due   OPHTHALMOLOGY EXAM  Never done   URINE MICROALBUMIN  Never done   TETANUS/TDAP  Never done   Zoster Vaccines- Shingrix (1 of 2) Never done   COVID-19 Vaccine (3 - Pfizer risk series) 11/14/2019   FOOT EXAM  05/17/2021   COLONOSCOPY (Pts 45-19yrs Insurance coverage will need to be confirmed)  06/08/2021   HEMOGLOBIN A1C  11/18/2021   Pneumonia Vaccine 49+ Years old  Completed   INFLUENZA VACCINE  Completed   Hepatitis C Screening  Completed   HPV VACCINES  Aged Out    Health Maintenance  Health Maintenance Due  Topic Date Due   OPHTHALMOLOGY EXAM  Never done   URINE MICROALBUMIN  Never done   TETANUS/TDAP  Never done   Zoster Vaccines- Shingrix (1 of 2) Never done   COVID-19 Vaccine (3 - Pfizer risk series) 11/14/2019   FOOT  EXAM  05/17/2021   COLONOSCOPY (Pts 45-76yrs Insurance coverage will need to be confirmed)  06/08/2021    Colorectal cancer screening: Type of screening: Colonoscopy. Completed 06/09/11. Repeat every 10 years  Lung Cancer Screening: (Low Dose CT Chest  recommended if Age 55-80 years, 30 pack-year currently smoking OR have quit w/in 15years.) does not qualify.   Lung Cancer Screening Referral: no  Additional Screening:  Hepatitis C Screening: does qualify; Completed 12/12/20  Vision Screening: Recommended annual ophthalmology exams for early detection of glaucoma and other disorders of the eye. Is the patient up to date with their annual eye exam?  Yes  Who is the provider or what is the name of the office in which the patient attends annual eye exams? Dr.Bell    Dental Screening: Recommended annual dental exams for proper oral hygiene  Community Resource Referral / Chronic Care Management: CRR required this visit?  No   CCM required this visit?  No      Plan:     I have personally reviewed and noted the following in the patient's chart:   Medical and social history Use of alcohol, tobacco or illicit drugs  Current medications and supplements including opioid prescriptions. Patient is not currently taking opioid prescriptions. Functional ability and status Nutritional status Physical activity Advanced directives List of other physicians Hospitalizations, surgeries, and ER visits in previous 12 months Vitals Screenings to include cognitive, depression, and falls Referrals and appointments  In addition, I have reviewed and discussed with patient certain preventive protocols, quality metrics, and best practice recommendations. A written personalized care plan for preventive services as well as general preventive health recommendations were provided to patient.     Dionisio David, LPN   [id number]   Nurse Notes: none

## 2021-06-19 NOTE — Patient Instructions (Signed)
Jeremy Dorsey , Thank you for taking time to come for your Medicare Wellness Visit. I appreciate your ongoing commitment to your health goals. Please review the following plan we discussed and let me know if I can assist you in the future.   Screening recommendations/referrals: Colonoscopy: 06/09/2011 now due Recommended yearly ophthalmology/optometry visit for glaucoma screening and checkup Recommended yearly dental visit for hygiene and checkup  Vaccinations: Influenza vaccine: 05/30/21 Pneumococcal vaccine: 05/22/20  Tdap vaccine: due Shingles vaccine: 01/23/11   Covid-19: 09/26/19, 10/17/19   Advanced directives: declined  Conditions/risks identified:   Next appointment: Follow up in one year for your annual wellness visit.   Preventive Care 13 Years and Older, Male Preventive care refers to lifestyle choices and visits with your health care provider that can promote health and wellness. What does preventive care include? A yearly physical exam. This is also called an annual well check. Dental exams once or twice a year. Routine eye exams. Ask your health care provider how often you should have your eyes checked. Personal lifestyle choices, including: Daily care of your teeth and gums. Regular physical activity. Eating a healthy diet. Avoiding tobacco and drug use. Limiting alcohol use. Practicing safe sex. Taking low doses of aspirin every day. Taking vitamin and mineral supplements as recommended by your health care provider. What happens during an annual well check? The services and screenings done by your health care provider during your annual well check will depend on your age, overall health, lifestyle risk factors, and family history of disease. Counseling  Your health care provider may ask you questions about your: Alcohol use. Tobacco use. Drug use. Emotional well-being. Home and relationship well-being. Sexual activity. Eating habits. History of falls. Memory  and ability to understand (cognition). Work and work Statistician. Screening  You may have the following tests or measurements: Height, weight, and BMI. Blood pressure. Lipid and cholesterol levels. These may be checked every 5 years, or more frequently if you are over 57 years old. Skin check. Lung cancer screening. You may have this screening every year starting at age 60 if you have a 30-pack-year history of smoking and currently smoke or have quit within the past 15 years. Fecal occult blood test (FOBT) of the stool. You may have this test every year starting at age 74. Flexible sigmoidoscopy or colonoscopy. You may have a sigmoidoscopy every 5 years or a colonoscopy every 10 years starting at age 5. Prostate cancer screening. Recommendations will vary depending on your family history and other risks. Hepatitis C blood test. Hepatitis B blood test. Sexually transmitted disease (STD) testing. Diabetes screening. This is done by checking your blood sugar (glucose) after you have not eaten for a while (fasting). You may have this done every 1-3 years. Abdominal aortic aneurysm (AAA) screening. You may need this if you are a current or former smoker. Osteoporosis. You may be screened starting at age 43 if you are at high risk. Talk with your health care provider about your test results, treatment options, and if necessary, the need for more tests. Vaccines  Your health care provider may recommend certain vaccines, such as: Influenza vaccine. This is recommended every year. Tetanus, diphtheria, and acellular pertussis (Tdap, Td) vaccine. You may need a Td booster every 10 years. Zoster vaccine. You may need this after age 14. Pneumococcal 13-valent conjugate (PCV13) vaccine. One dose is recommended after age 82. Pneumococcal polysaccharide (PPSV23) vaccine. One dose is recommended after age 66. Talk to your health care provider about  which screenings and vaccines you need and how often you  need them. This information is not intended to replace advice given to you by your health care provider. Make sure you discuss any questions you have with your health care provider. Document Released: 08/16/2015 Document Revised: 04/08/2016 Document Reviewed: 05/21/2015 Elsevier Interactive Patient Education  2017 [employer] Prevention in the Home Falls can cause injuries. They can happen to people of all ages. There are many things you can do to make your home safe and to help prevent falls. What can I do on the outside of my home? Regularly fix the edges of walkways and driveways and fix any cracks. Remove anything that might make you trip as you walk through a door, such as a raised step or threshold. Trim any bushes or trees on the path to your home. Use bright outdoor lighting. Clear any walking paths of anything that might make someone trip, such as rocks or tools. Regularly check to see if handrails are loose or broken. Make sure that both sides of any steps have handrails. Any raised decks and porches should have guardrails on the edges. Have any leaves, snow, or ice cleared regularly. Use sand or salt on walking paths during winter. Clean up any spills in your garage right away. This includes oil or grease spills. What can I do in the bathroom? Use night lights. Install grab bars by the toilet and in the tub and shower. Do not use towel bars as grab bars. Use non-skid mats or decals in the tub or shower. If you need to sit down in the shower, use a plastic, non-slip stool. Keep the floor dry. Clean up any water that spills on the floor as soon as it happens. Remove soap buildup in the tub or shower regularly. Attach bath mats securely with double-sided non-slip rug tape. Do not have throw rugs and other things on the floor that can make you trip. What can I do in the bedroom? Use night lights. Make sure that you have a light by your bed that is easy to reach. Do not  use any sheets or blankets that are too big for your bed. They should not hang down onto the floor. Have a firm chair that has side arms. You can use this for support while you get dressed. Do not have throw rugs and other things on the floor that can make you trip. What can I do in the kitchen? Clean up any spills right away. Avoid walking on wet floors. Keep items that you use a lot in easy-to-reach places. If you need to reach something above you, use a strong step stool that has a grab bar. Keep electrical cords out of the way. Do not use floor polish or wax that makes floors slippery. If you must use wax, use non-skid floor wax. Do not have throw rugs and other things on the floor that can make you trip. What can I do with my stairs? Do not leave any items on the stairs. Make sure that there are handrails on both sides of the stairs and use them. Fix handrails that are broken or loose. Make sure that handrails are as long as the stairways. Check any carpeting to make sure that it is firmly attached to the stairs. Fix any carpet that is loose or worn. Avoid having throw rugs at the top or bottom of the stairs. If you do have throw rugs, attach them to the floor with  carpet tape. Make sure that you have a light switch at the top of the stairs and the bottom of the stairs. If you do not have them, ask someone to add them for you. What else can I do to help prevent falls? Wear shoes that: Do not have high heels. Have rubber bottoms. Are comfortable and fit you well. Are closed at the toe. Do not wear sandals. If you use a stepladder: Make sure that it is fully opened. Do not climb a closed stepladder. Make sure that both sides of the stepladder are locked into place. Ask someone to hold it for you, if possible. Clearly [place] and make sure that you can see: Any grab bars or handrails. First and last steps. Where the edge of each step is. Use tools that help you move around (mobility  aids) if they are needed. These include: Canes. Walkers. Scooters. Crutches. Turn on the lights when you go into a dark area. Replace any light bulbs as soon as they burn out. Set up your furniture so you have a clear path. Avoid moving your furniture around. If any of your floors are uneven, fix them. If there are any pets around you, be aware of where they are. Review your medicines with your doctor. Some medicines can make you feel dizzy. This can increase your chance of falling. Ask your doctor what other things that you can do to help prevent falls. This information is not intended to replace advice given to you by your health care provider. Make sure you discuss any questions you have with your health care provider. Document Released: 05/16/2009 Document Revised: 12/26/2015 Document Reviewed: 08/24/2014 Elsevier Interactive Patient Education  2017 [employer].

## 2021-06-30 ENCOUNTER — Ambulatory Visit: Payer: PPO | Admitting: Surgery

## 2021-06-30 ENCOUNTER — Other Ambulatory Visit: Payer: Self-pay

## 2021-06-30 ENCOUNTER — Ambulatory Visit (INDEPENDENT_AMBULATORY_CARE_PROVIDER_SITE_OTHER)
Admission: RE | Admit: 2021-06-30 | Discharge: 2021-06-30 | Disposition: A | Discharge status: Home / Self Care | Payer: PPO | Source: Ambulatory Visit | Attending: Surgery | Admitting: Surgery

## 2021-06-30 ENCOUNTER — Ambulatory Visit (HOSPITAL_COMMUNITY)
Admission: RE | Admit: 2021-06-30 | Discharge: 2021-06-30 | Disposition: A | Discharge status: Home / Self Care | Payer: PPO | Source: Ambulatory Visit | Attending: Surgery | Admitting: Surgery

## 2021-06-30 ENCOUNTER — Encounter: Payer: Self-pay | Admitting: Surgery

## 2021-06-30 VITALS — BP 145/80 | HR 84 | Temp 98.2°F | Resp 20 | Ht 69.0 in | Wt 230.0 lb

## 2021-06-30 DIAGNOSIS — N185 Chronic kidney disease, stage 5: Secondary | ICD-10-CM

## 2021-06-30 NOTE — Progress Notes (Signed)
[facility] [place]  Patient name: Jeremy Dorsey MRN: [id number] DOB: 1951-06-11 Sex: male   REASON FOR VISIT:    Follow up  HISOTRY OF PRESENT ILLNESS:    Jeremy Dorsey is a 70 y.o. male who is status post left radiocephalic fistula by Dr. Donzetta Matters on 10/31/2020.  He is not yet on dialysis, but his fistula is no longer working.  He is right-handed.  He does complain of some numbness in his fingers on the left that has been present since his procedure.  Patient is a diabetic.  He suffers from coronary artery disease, status post PCI.  He takes a statin for hypercholesterolemia.   PAST MEDICAL HISTORY:   Past Medical History:  Diagnosis Date   Anemia    CKD (chronic kidney disease), stage IV (HCC)    Coronary artery disease    a. s/p IVUS-guided DESx2 to prox & mid LAD, residual disease treated medically. EF 50-55% by recent echo 02/2020.   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   Hyperlipidemia LDL goal <70    Hypertension 2008   Lower extremity edema    Shoulder pain    Right   Urinary complication      FAMILY HISTORY:   Family History  Problem Relation Age of Onset   Psoriasis Mother    Heart failure Father     SOCIAL HISTORY:   Social History   Tobacco Use   Smoking status: Former    Packs/day: 1.00    Years: 30.00    Pack years: 30.00    Types: Cigarettes    Quit date: 08/23/2007    Years since quitting: 13.8   Smokeless tobacco: Never  Substance Use Topics   Alcohol use: No     ALLERGIES:   Allergies  Allergen Reactions   Oxycodone Nausea And Vomiting   Hydrocodone Nausea And Vomiting     CURRENT MEDICATIONS:   Current Outpatient Medications  Medication Sig Dispense Refill   aspirin 81 MG EC tablet Take 1 tablet (81 mg total) by mouth daily. Swallow whole. 30 tablet 11   atorvastatin (LIPITOR) 80 MG tablet TAKE 1 TABLET BY MOUTH EVERY DAY 90 tablet 2   calcitRIOL  (ROCALTROL) 0.25 MCG capsule Take 0.5 mcg by mouth daily.     clopidogrel (PLAVIX) 75 MG tablet TAKE 1 TABLET BY MOUTH DAILY WITH BREAKFAST. 90 tablet 2   furosemide (LASIX) 40 MG tablet Take 40 mg by mouth 3 (three) times daily.     metoprolol tartrate (LOPRESSOR) 25 MG tablet TAKE 1 TABLET BY MOUTH TWICE A DAY 180 tablet 2   nitroGLYCERIN (NITROSTAT) 0.4 MG SL tablet Place 1 tablet (0.4 mg total) under the tongue every 5 (five) minutes x 3 doses as needed for chest pain. 25 tablet 12   sodium bicarbonate 650 MG tablet Take 650 mg by mouth 2 (two) times daily.      No current facility-administered medications for this visit.    REVIEW OF SYSTEMS:   [X]  denotes positive finding, [ ]  denotes negative finding Cardiac  Comments:  Chest pain or chest pressure:    Shortness of breath upon exertion:    Short of breath when lying flat:    Irregular heart rhythm:        Vascular    Pain in calf, thigh, or hip brought on by ambulation:    Pain in feet at night that wakes you up from your sleep:     Blood  clot in your veins:    Leg swelling:         Pulmonary    Oxygen at home:    Productive cough:     Wheezing:         Neurologic    Sudden weakness in arms or legs:     Sudden numbness in arms or legs:     Sudden onset of difficulty speaking or slurred speech:    Temporary loss of vision in one eye:     Problems with dizziness:         Gastrointestinal    Blood in stool:     Vomited blood:         Genitourinary    Burning when urinating:     Blood in urine:        Psychiatric    Major depression:         Hematologic    Bleeding problems:    Problems with blood clotting too easily:        Skin    Rashes or ulcers:        Constitutional    Fever or chills:      PHYSICAL EXAM:   Vitals:   06/30/21 1049  BP: (!) 145/80  Pulse: 84  Resp: 20  Temp: 98.2 F (36.8 C)  SpO2: 96%  Weight: 230 lb (104.3 kg)  Height: 5\' 9"  (1.753 m)    GENERAL: The patient is a  well-nourished male, in no acute distress. The vital signs are documented above. CARDIAC: There is a regular rate and rhythm.  VASCULAR: Occluded left radiocephalic fistula PULMONARY: Non-labored respirations MUSCULOSKELETAL: There are no major deformities or cyanosis. NEUROLOGIC: No focal weakness or paresthesias are detected. SKIN: There are no ulcers or rashes noted. PSYCHIATRIC: The patient has a normal affect.  STUDIES:   I have reviewed his ultrasounds with the following findings: +-----------------+-------------+----------+--------------------+  Left Cephalic    Diameter (cm)Depth (cm)      Findings        +-----------------+-------------+----------+--------------------+  Shoulder             0.62        0.71                         +-----------------+-------------+----------+--------------------+  Prox upper arm       0.54        0.43        branching        +-----------------+-------------+----------+--------------------+  Mid upper arm        0.57        0.32                         +-----------------+-------------+----------+--------------------+  Dist upper arm       0.56        0.46        branching        +-----------------+-------------+----------+--------------------+  Antecubital fossa    0.69        0.42                         +-----------------+-------------+----------+--------------------+  Prox forearm         0.47        0.52        branching        +-----------------+-------------+----------+--------------------+  Mid forearm  0.45        0.48                         +-----------------+-------------+----------+--------------------+  Dist forearm         0.32        0.37   old AVF - MEDICAL ISSUES:   CKD 5: The patient is in need of a new fistula.  He does not want anything in his right arm.  I discussed proceeding with a left brachiocephalic fistula.  He is at slightly high risk for steal given his existing  symptoms, but we discussed proceeding with a left arm despite this.  This will be scheduled for next week.    Leia Alf, MD, FACS [facility] [phone] Pager [phone]

## 2021-06-30 NOTE — H&P (View-Only) (Signed)
[facility] [place]  Patient name: Jeremy Dorsey MRN: [id number] DOB: 03-12-1951 Sex: male   REASON FOR VISIT:    Follow up  HISOTRY OF PRESENT ILLNESS:    Jeremy Dorsey is a 70 y.o. male who is status post left radiocephalic fistula by Dr. Donzetta Matters on 10/31/2020.  He is not yet on dialysis, but his fistula is no longer working.  He is right-handed.  He does complain of some numbness in his fingers on the left that has been present since his procedure.  Patient is a diabetic.  He suffers from coronary artery disease, status post PCI.  He takes a statin for hypercholesterolemia.   PAST MEDICAL HISTORY:   Past Medical History:  Diagnosis Date   Anemia    CKD (chronic kidney disease), stage IV (HCC)    Coronary artery disease    a. s/p IVUS-guided DESx2 to prox & mid LAD, residual disease treated medically. EF 50-55% by recent echo 02/2020.   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   Hyperlipidemia LDL goal <70    Hypertension 2008   Lower extremity edema    Shoulder pain    Right   Urinary complication      FAMILY HISTORY:   Family History  Problem Relation Age of Onset   Psoriasis Mother    Heart failure Father     SOCIAL HISTORY:   Social History   Tobacco Use   Smoking status: Former    Packs/day: 1.00    Years: 30.00    Pack years: 30.00    Types: Cigarettes    Quit date: 08/23/2007    Years since quitting: 13.8   Smokeless tobacco: Never  Substance Use Topics   Alcohol use: No     ALLERGIES:   Allergies  Allergen Reactions   Oxycodone Nausea And Vomiting   Hydrocodone Nausea And Vomiting     CURRENT MEDICATIONS:   Current Outpatient Medications  Medication Sig Dispense Refill   aspirin 81 MG EC tablet Take 1 tablet (81 mg total) by mouth daily. Swallow whole. 30 tablet 11   atorvastatin (LIPITOR) 80 MG tablet TAKE 1 TABLET BY MOUTH EVERY DAY 90 tablet 2   calcitRIOL  (ROCALTROL) 0.25 MCG capsule Take 0.5 mcg by mouth daily.     clopidogrel (PLAVIX) 75 MG tablet TAKE 1 TABLET BY MOUTH DAILY WITH BREAKFAST. 90 tablet 2   furosemide (LASIX) 40 MG tablet Take 40 mg by mouth 3 (three) times daily.     metoprolol tartrate (LOPRESSOR) 25 MG tablet TAKE 1 TABLET BY MOUTH TWICE A DAY 180 tablet 2   nitroGLYCERIN (NITROSTAT) 0.4 MG SL tablet Place 1 tablet (0.4 mg total) under the tongue every 5 (five) minutes x 3 doses as needed for chest pain. 25 tablet 12   sodium bicarbonate 650 MG tablet Take 650 mg by mouth 2 (two) times daily.      No current facility-administered medications for this visit.    REVIEW OF SYSTEMS:   [X]  denotes positive finding, [ ]  denotes negative finding Cardiac  Comments:  Chest pain or chest pressure:    Shortness of breath upon exertion:    Short of breath when lying flat:    Irregular heart rhythm:        Vascular    Pain in calf, thigh, or hip brought on by ambulation:    Pain in feet at night that wakes you up from your sleep:     Blood  clot in your veins:    Leg swelling:         Pulmonary    Oxygen at home:    Productive cough:     Wheezing:         Neurologic    Sudden weakness in arms or legs:     Sudden numbness in arms or legs:     Sudden onset of difficulty speaking or slurred speech:    Temporary loss of vision in one eye:     Problems with dizziness:         Gastrointestinal    Blood in stool:     Vomited blood:         Genitourinary    Burning when urinating:     Blood in urine:        Psychiatric    Major depression:         Hematologic    Bleeding problems:    Problems with blood clotting too easily:        Skin    Rashes or ulcers:        Constitutional    Fever or chills:      PHYSICAL EXAM:   Vitals:   06/30/21 1049  BP: (!) 145/80  Pulse: 84  Resp: 20  Temp: 98.2 F (36.8 C)  SpO2: 96%  Weight: 230 lb (104.3 kg)  Height: 5\' 9"  (1.753 m)    GENERAL: The patient is a  well-nourished male, in no acute distress. The vital signs are documented above. CARDIAC: There is a regular rate and rhythm.  VASCULAR: Occluded left radiocephalic fistula PULMONARY: Non-labored respirations MUSCULOSKELETAL: There are no major deformities or cyanosis. NEUROLOGIC: No focal weakness or paresthesias are detected. SKIN: There are no ulcers or rashes noted. PSYCHIATRIC: The patient has a normal affect.  STUDIES:   I have reviewed his ultrasounds with the following findings: +-----------------+-------------+----------+--------------------+  Left Cephalic    Diameter (cm)Depth (cm)      Findings        +-----------------+-------------+----------+--------------------+  Shoulder             0.62        0.71                         +-----------------+-------------+----------+--------------------+  Prox upper arm       0.54        0.43        branching        +-----------------+-------------+----------+--------------------+  Mid upper arm        0.57        0.32                         +-----------------+-------------+----------+--------------------+  Dist upper arm       0.56        0.46        branching        +-----------------+-------------+----------+--------------------+  Antecubital fossa    0.69        0.42                         +-----------------+-------------+----------+--------------------+  Prox forearm         0.47        0.52        branching        +-----------------+-------------+----------+--------------------+  Mid forearm  0.45        0.48                         +-----------------+-------------+----------+--------------------+  Dist forearm         0.32        0.37   old AVF - MEDICAL ISSUES:   CKD 5: The patient is in need of a new fistula.  He does not want anything in his right arm.  I discussed proceeding with a left brachiocephalic fistula.  He is at slightly high risk for steal given his existing  symptoms, but we discussed proceeding with a left arm despite this.  This will be scheduled for next week.    Leia Alf, MD, FACS [facility] [phone] Pager [phone]

## 2021-07-02 ENCOUNTER — Other Ambulatory Visit: Payer: Self-pay

## 2021-07-03 DIAGNOSIS — E113393 Type 2 diabetes mellitus with moderate nonproliferative diabetic retinopathy without macular edema, bilateral: Secondary | ICD-10-CM | POA: Diagnosis not present

## 2021-07-07 ENCOUNTER — Telehealth: Payer: Self-pay

## 2021-07-07 ENCOUNTER — Encounter (HOSPITAL_COMMUNITY): Payer: Self-pay | Admitting: Surgery

## 2021-07-07 ENCOUNTER — Other Ambulatory Visit: Payer: Self-pay

## 2021-07-07 NOTE — Progress Notes (Signed)
Spoke with pt for pre-op call. Pt has hx of CAD and is on Plavix. I instructed him per Dr. Stephens Shire office to not take Plavix prior to surgery as of today. Pt voiced understanding. Pt denies any recent chest pain or shortness of breath. Pt is a type 2 diabetic. Last A1C was 6.2 on 05/20/21. Pt states he rarely checks his blood sugar at home.   Pt's surgery is scheduled as ambulatory so no Covid test is required prior to surgery.

## 2021-07-07 NOTE — Telephone Encounter (Signed)
Pt can start holding Plavix today, per MD. Helene Kelp in PAT is aware and will let pt know today.

## 2021-07-09 ENCOUNTER — Ambulatory Visit (HOSPITAL_COMMUNITY): Payer: PPO | Admitting: Registered Nurse

## 2021-07-09 ENCOUNTER — Encounter (HOSPITAL_COMMUNITY): Payer: Self-pay | Admitting: Surgery

## 2021-07-09 ENCOUNTER — Other Ambulatory Visit: Payer: Self-pay

## 2021-07-09 ENCOUNTER — Encounter (HOSPITAL_COMMUNITY)
Admission: RE | Disposition: A | Discharge status: Home / Self Care | Payer: Self-pay | Source: Home / Self Care | Attending: Surgery

## 2021-07-09 ENCOUNTER — Ambulatory Visit (HOSPITAL_COMMUNITY)
Admission: RE | Admit: 2021-07-09 | Discharge: 2021-07-09 | Disposition: A | Discharge status: Home / Self Care | Payer: PPO | Attending: Surgery | Admitting: Surgery

## 2021-07-09 DIAGNOSIS — N186 End stage renal disease: Secondary | ICD-10-CM | POA: Diagnosis not present

## 2021-07-09 DIAGNOSIS — N185 Chronic kidney disease, stage 5: Secondary | ICD-10-CM

## 2021-07-09 DIAGNOSIS — I2510 Atherosclerotic heart disease of native coronary artery without angina pectoris: Secondary | ICD-10-CM | POA: Insufficient documentation

## 2021-07-09 DIAGNOSIS — E1122 Type 2 diabetes mellitus with diabetic chronic kidney disease: Secondary | ICD-10-CM | POA: Diagnosis not present

## 2021-07-09 DIAGNOSIS — E7800 Pure hypercholesterolemia, unspecified: Secondary | ICD-10-CM | POA: Diagnosis not present

## 2021-07-09 DIAGNOSIS — Z992 Dependence on renal dialysis: Secondary | ICD-10-CM | POA: Diagnosis not present

## 2021-07-09 DIAGNOSIS — I120 Hypertensive chronic kidney disease with stage 5 chronic kidney disease or end stage renal disease: Secondary | ICD-10-CM | POA: Insufficient documentation

## 2021-07-09 HISTORY — DX: Malignant (primary) neoplasm, unspecified: C80.1

## 2021-07-09 HISTORY — DX: Cerebral infarction, unspecified: I63.9

## 2021-07-09 HISTORY — PX: AV FISTULA PLACEMENT: SHX1204

## 2021-07-09 LAB — POCT I-STAT, CHEM 8
BUN: 113 mg/dL — ABNORMAL HIGH (ref 8–23)
Calcium, Ion: 1.27 mmol/L (ref 1.15–1.40)
Chloride: 109 mmol/L (ref 98–111)
Creatinine, Ser: 7.6 mg/dL — ABNORMAL HIGH (ref 0.61–1.24)
Glucose, Bld: 182 mg/dL — ABNORMAL HIGH (ref 70–99)
HCT: 36 % — ABNORMAL LOW (ref 39.0–52.0)
Hemoglobin: 12.2 g/dL — ABNORMAL LOW (ref 13.0–17.0)
Potassium: 4.3 mmol/L (ref 3.5–5.1)
Sodium: 139 mmol/L (ref 135–145)
TCO2: 18 mmol/L — ABNORMAL LOW (ref 22–32)

## 2021-07-09 LAB — GLUCOSE, CAPILLARY
Glucose-Capillary: 184 mg/dL — ABNORMAL HIGH (ref 70–99)
Glucose-Capillary: 190 mg/dL — ABNORMAL HIGH (ref 70–99)

## 2021-07-09 SURGERY — ARTERIOVENOUS (AV) FISTULA CREATION
Anesthesia: Monitor Anesthesia Care | Site: Arm Upper | Laterality: Left

## 2021-07-09 MED ORDER — ORAL CARE MOUTH RINSE: 15.0000 mL | Freq: Once | OROMUCOSAL | Status: AC

## 2021-07-09 MED ORDER — LIDOCAINE-EPINEPHRINE (PF) 1.5 %-1:200000 IJ SOLN
INTRAMUSCULAR | Status: DC | PRN
  Administered 2021-07-09: 30 mL via PERINEURAL

## 2021-07-09 MED ORDER — FENTANYL CITRATE (PF) 100 MCG/2ML IJ SOLN
INTRAMUSCULAR | Status: DC | PRN
  Administered 2021-07-09: 50 ug via INTRAVENOUS

## 2021-07-09 MED ORDER — PROPOFOL 500 MG/50ML IV EMUL
INTRAVENOUS | Status: DC | PRN
  Administered 2021-07-09: 75 ug/kg/min via INTRAVENOUS

## 2021-07-09 MED ORDER — 0.9 % SODIUM CHLORIDE (POUR BTL) OPTIME
TOPICAL | Status: DC | PRN
  Administered 2021-07-09: 1000 mL

## 2021-07-09 MED ORDER — ONDANSETRON HCL 4 MG/2ML IJ SOLN: 4.0000 mg | Freq: Once | INTRAMUSCULAR | Status: DC | PRN

## 2021-07-09 MED ORDER — DEXAMETHASONE SODIUM PHOSPHATE 10 MG/ML IJ SOLN
INTRAMUSCULAR | Status: DC | PRN
  Administered 2021-07-09: 4 mg via INTRAVENOUS

## 2021-07-09 MED ORDER — CHLORHEXIDINE GLUCONATE 4 % EX LIQD: 60.0000 mL | Freq: Once | CUTANEOUS | Status: DC

## 2021-07-09 MED ORDER — FENTANYL CITRATE (PF) 100 MCG/2ML IJ SOLN: 25.0000 ug | INTRAMUSCULAR | Status: DC | PRN

## 2021-07-09 MED ORDER — MIDAZOLAM HCL 2 MG/2ML IJ SOLN
INTRAMUSCULAR | Status: DC | PRN
  Administered 2021-07-09: 1 mg via INTRAVENOUS

## 2021-07-09 MED ORDER — DEXAMETHASONE SODIUM PHOSPHATE 10 MG/ML IJ SOLN
INTRAMUSCULAR | Status: AC
  Filled 2021-07-09: qty 1

## 2021-07-09 MED ORDER — CEFAZOLIN SODIUM-DEXTROSE 2-4 GM/100ML-% IV SOLN
2.0000 g | INTRAVENOUS | Status: AC
  Administered 2021-07-09: 2 g via INTRAVENOUS
  Filled 2021-07-09: qty 100

## 2021-07-09 MED ORDER — ACETAMINOPHEN 10 MG/ML IV SOLN: 1000.0000 mg | Freq: Once | INTRAVENOUS | Status: DC | PRN

## 2021-07-09 MED ORDER — PROPOFOL 10 MG/ML IV BOLUS
INTRAVENOUS | Status: DC | PRN
  Administered 2021-07-09: 20 mg via INTRAVENOUS
  Administered 2021-07-09: 30 mg via INTRAVENOUS

## 2021-07-09 MED ORDER — TRAMADOL HCL 50 MG PO TABS: 50.0000 mg | ORAL_TABLET | Freq: Four times a day (QID) | ORAL | 0 refills | Status: DC | PRN

## 2021-07-09 MED ORDER — ONDANSETRON HCL 4 MG/2ML IJ SOLN
INTRAMUSCULAR | Status: DC | PRN
  Administered 2021-07-09: 4 mg via INTRAVENOUS

## 2021-07-09 MED ORDER — SODIUM CHLORIDE 0.9 % IV SOLN: INTRAVENOUS | Status: DC

## 2021-07-09 MED ORDER — HEPARIN 6000 UNIT IRRIGATION SOLUTION
Status: DC | PRN
  Administered 2021-07-09: 1

## 2021-07-09 MED ORDER — LIDOCAINE 2% (20 MG/ML) 5 ML SYRINGE
INTRAMUSCULAR | Status: DC | PRN
  Administered 2021-07-09: 60 mg via INTRAVENOUS

## 2021-07-09 MED ORDER — CHLORHEXIDINE GLUCONATE 0.12 % MT SOLN
15.0000 mL | Freq: Once | OROMUCOSAL | Status: AC
  Administered 2021-07-09: 15 mL via OROMUCOSAL
  Filled 2021-07-09: qty 15

## 2021-07-09 MED ORDER — ONDANSETRON HCL 4 MG/2ML IJ SOLN
INTRAMUSCULAR | Status: AC
  Filled 2021-07-09: qty 2

## 2021-07-09 MED ORDER — MIDAZOLAM HCL 2 MG/2ML IJ SOLN
INTRAMUSCULAR | Status: AC
  Filled 2021-07-09: qty 2

## 2021-07-09 MED ORDER — FENTANYL CITRATE (PF) 100 MCG/2ML IJ SOLN
INTRAMUSCULAR | Status: AC
  Filled 2021-07-09: qty 2

## 2021-07-09 MED ORDER — LIDOCAINE 2% (20 MG/ML) 5 ML SYRINGE
INTRAMUSCULAR | Status: AC
  Filled 2021-07-09: qty 5

## 2021-07-09 SURGICAL SUPPLY — 32 items
ADH SKN CLS APL DERMABOND .7 (GAUZE/BANDAGES/DRESSINGS) ×1
ARMBAND PINK RESTRICT EXTREMIT (MISCELLANEOUS) ×4 IMPLANT
BAG COUNTER SPONGE SURGICOUNT (BAG) ×2 IMPLANT
BAG SPNG CNTER NS LX DISP (BAG) ×1
CANISTER SUCT 3000ML PPV (MISCELLANEOUS) ×2 IMPLANT
CLIP VESOCCLUDE MED 6/CT (CLIP) ×2 IMPLANT
CLIP VESOCCLUDE SM WIDE 6/CT (CLIP) ×2 IMPLANT
COVER PROBE W GEL 5X96 (DRAPES) ×2 IMPLANT
DERMABOND ADVANCED (GAUZE/BANDAGES/DRESSINGS) ×1
DERMABOND ADVANCED .7 DNX12 (GAUZE/BANDAGES/DRESSINGS) ×1 IMPLANT
ELECT REM PT RETURN 9FT ADLT (ELECTROSURGICAL) ×2
ELECTRODE REM PT RTRN 9FT ADLT (ELECTROSURGICAL) ×1 IMPLANT
GLOVE SRG 8 PF TXTR STRL LF DI (GLOVE) ×1 IMPLANT
GLOVE SURG POLYISO LF SZ7.5 (GLOVE) ×2 IMPLANT
GLOVE SURG UNDER POLY LF SZ8 (GLOVE) ×2
GOWN STRL REUS W/ TWL LRG LVL3 (GOWN DISPOSABLE) ×2 IMPLANT
GOWN STRL REUS W/ TWL XL LVL3 (GOWN DISPOSABLE) ×1 IMPLANT
GOWN STRL REUS W/TWL LRG LVL3 (GOWN DISPOSABLE) ×4
GOWN STRL REUS W/TWL XL LVL3 (GOWN DISPOSABLE) ×2
HEMOSTAT SNOW SURGICEL 2X4 (HEMOSTASIS) IMPLANT
KIT BASIN OR (CUSTOM PROCEDURE TRAY) ×2 IMPLANT
KIT TURNOVER KIT B (KITS) ×2 IMPLANT
NS IRRIG 1000ML POUR BTL (IV SOLUTION) ×2 IMPLANT
PACK CV ACCESS (CUSTOM PROCEDURE TRAY) ×2 IMPLANT
PAD ARMBOARD 7.5X6 YLW CONV (MISCELLANEOUS) ×4 IMPLANT
SUT PROLENE 6 0 CC (SUTURE) ×2 IMPLANT
SUT VIC AB 3-0 SH 27 (SUTURE) ×2
SUT VIC AB 3-0 SH 27X BRD (SUTURE) ×1 IMPLANT
SUT VICRYL 4-0 PS2 18IN ABS (SUTURE) ×2 IMPLANT
TOWEL GREEN STERILE (TOWEL DISPOSABLE) ×2 IMPLANT
UNDERPAD 30X36 HEAVY ABSORB (UNDERPADS AND DIAPERS) ×2 IMPLANT
WATER STERILE IRR 1000ML POUR (IV SOLUTION) ×2 IMPLANT

## 2021-07-09 NOTE — Transfer of Care (Signed)
Immediate Anesthesia Transfer of Care Note  Patient: Jeremy Dorsey  Procedure(s) Performed: LEFT ARM ARTERIOVENOUS (AV) FISTULA CREATION (Left: Arm Upper)  Patient Location: PACU  Anesthesia Type:MAC and Regional  Level of Consciousness: awake and drowsy  Airway & Oxygen Therapy: Patient Spontanous Breathing and Patient connected to nasal cannula oxygen  Post-op Assessment: Report given to RN and Post -op Vital signs reviewed and stable  Post vital signs: Reviewed and stable  Last Vitals:  Vitals Value Taken Time  BP 117/69 07/09/21 1002  Temp 36.5 C 07/09/21 1002  Pulse 75 07/09/21 1009  Resp 18 07/09/21 1009  SpO2 94 % 07/09/21 1009  Vitals shown include unvalidated device data.  Last Pain:  Vitals:   07/09/21 1002  TempSrc:   PainSc: 0-No pain         Complications: No notable events documented.

## 2021-07-09 NOTE — Anesthesia Procedure Notes (Signed)
Anesthesia Regional Block: Supraclavicular block   Pre-Anesthetic Checklist: , timeout performed,  Correct Patient, Correct Site, Correct Laterality,  Correct Procedure, Correct Position, site marked,  Risks and benefits discussed,  Surgical consent,  Pre-op evaluation,  At surgeon's request and post-op pain management  Laterality: Left  Prep: chloraprep       Needles:  Injection technique: Single-shot  Needle Type: Echogenic Needle     Needle Length: 9cm      Additional Needles:   Procedures:,,,, ultrasound used (permanent image in chart),,    Narrative:  Start time: 07/09/2021 7:44 AM End time: 07/09/2021 7:50 AM Injection made incrementally with aspirations every 5 mL.  Performed by: Personally  Anesthesiologist: Myrtie Soman, MD  Additional Notes: Patient tolerated the procedure well without complications

## 2021-07-09 NOTE — Anesthesia Preprocedure Evaluation (Signed)
Anesthesia Evaluation  Patient identified by MRN, date of birth, ID band Patient awake    Reviewed: Allergy & Precautions, H&P , NPO status , Patient's Chart, lab work & pertinent test results  Airway Mallampati: II  TM Distance: >3 FB Neck ROM: Full    Dental no notable dental hx.    Pulmonary neg pulmonary ROS, former smoker,    Pulmonary exam normal breath sounds clear to auscultation       Cardiovascular hypertension, + CAD and + Cardiac Stents  Normal cardiovascular exam Rhythm:Regular Rate:Normal     Neuro/Psych negative neurological ROS  negative psych ROS   GI/Hepatic negative GI ROS, Neg liver ROS,   Endo/Other  diabetes  Renal/GU DialysisRenal disease  negative genitourinary   Musculoskeletal negative musculoskeletal ROS (+)   Abdominal   Peds negative pediatric ROS (+)  Hematology  (+) anemia ,   Anesthesia Other Findings   Reproductive/Obstetrics negative OB ROS                             Anesthesia Physical Anesthesia Plan  ASA: 3  Anesthesia Plan: MAC   Post-op Pain Management: Regional block   Induction: Intravenous  PONV Risk Score and Plan: 1 and Treatment may vary due to age or medical condition and Ondansetron  Airway Management Planned: Simple Face Mask  Additional Equipment:   Intra-op Plan:   Post-operative Plan:   Informed Consent: I have reviewed the patients History and Physical, chart, labs and discussed the procedure including the risks, benefits and alternatives for the proposed anesthesia with the patient or authorized representative who has indicated his/her understanding and acceptance.     Dental advisory given  Plan Discussed with: CRNA and Surgeon  Anesthesia Plan Comments:         Anesthesia Quick Evaluation

## 2021-07-09 NOTE — Anesthesia Postprocedure Evaluation (Signed)
Anesthesia Post Note  Patient: Jeremy Dorsey  Procedure(s) Performed: LEFT ARM ARTERIOVENOUS (AV) FISTULA CREATION (Left: Arm Upper)     Patient location during evaluation: PACU Anesthesia Type: Regional and MAC Level of consciousness: awake and alert Pain management: pain level controlled Vital Signs Assessment: post-procedure vital signs reviewed and stable Respiratory status: spontaneous breathing, nonlabored ventilation, respiratory function stable and patient connected to nasal cannula oxygen Cardiovascular status: stable and blood pressure returned to baseline Postop Assessment: no apparent nausea or vomiting Anesthetic complications: no   No notable events documented.  Last Vitals:  Vitals:   07/09/21 1002 07/09/21 1017  BP: 117/69 107/65  Pulse: 74 78  Resp: 20 20  Temp: 36.5 C 36.5 C  SpO2: 99% 96%    Last Pain:  Vitals:   07/09/21 1002  TempSrc:   PainSc: 0-No pain                 Khloe Hunkele S

## 2021-07-09 NOTE — Anesthesia Procedure Notes (Signed)
Procedure Name: MAC Date/Time: 07/09/2021 8:30 AM Performed by: Trinna Post., CRNA Pre-anesthesia Checklist: Patient identified, Emergency Drugs available, Suction available, Patient being monitored and Timeout performed Patient Re-evaluated:Patient Re-evaluated prior to induction Oxygen Delivery Method: Simple face mask Preoxygenation: Pre-oxygenation with 100% oxygen Induction Type: IV induction Placement Confirmation: positive ETCO2

## 2021-07-09 NOTE — Discharge Instructions (Signed)
[facility]  Discharge Instructions  AV Fistula or Graft Surgery for Dialysis Access  Please refer to the following instructions for your post-procedure care. Your surgeon or physician assistant will discuss any changes with you.  Activity  You may drive the day following your surgery, if you are comfortable and no longer taking prescription pain medication. Resume full activity as the soreness in your incision resolves.  Bathing/Showering  You may shower after you go home. Keep your incision dry for 48 hours. Do not soak in a bathtub, hot tub, or swim until the incision heals completely. You may not shower if you have a hemodialysis catheter.  Incision Care  Clean your incision with mild soap and water after 48 hours. Pat the area dry with a clean towel. You do not need a bandage unless otherwise instructed. Do not apply any ointments or creams to your incision. You may have skin glue on your incision. Do not peel it off. It will come off on its own in about one week. Your arm may swell a bit after surgery. To reduce swelling use pillows to elevate your arm so it is above your heart. Your doctor will tell you if you need to lightly wrap your arm with an ACE bandage.  Diet  Resume your normal diet. There are not special food restrictions following this procedure. In order to heal from your surgery, it is CRITICAL to get adequate nutrition. Your body requires vitamins, minerals, and protein. Vegetables are the best source of vitamins and minerals. Vegetables also provide the perfect balance of protein. Processed food has little nutritional value, so try to avoid this.  Medications  Resume taking all of your medications. If your incision is causing pain, you may take over-the counter pain relievers such as acetaminophen (Tylenol). If you were prescribed a stronger pain medication, please be aware these medications can cause nausea and constipation. Prevent  nausea by taking the medication with a snack or meal. Avoid constipation by drinking plenty of fluids and eating foods with high amount of fiber, such as fruits, vegetables, and grains.  Do not take Tylenol if you are taking prescription pain medications.  Follow up Your surgeon may want to see you in the office following your access surgery. If so, this will be arranged at the time of your surgery.  Please call us immediately for any of the following conditions:  Increased pain, redness, drainage (pus) from your incision site Fever of 101 degrees or higher Severe or worsening pain at your incision site Hand pain or numbness.  Reduce your risk of vascular disease:  Stop smoking. If you would like help, call [facility] at 1-800-QUIT-NOW 587-813-0332) or [employer] at [place] your cholesterol Maintain a desired weight Control your diabetes Keep your blood pressure down  Dialysis  It will take several weeks to several months for your new dialysis access to be ready for use. Your surgeon will determine when it is okay to use it. Your nephrologist will continue to direct your dialysis. You can continue to use your Permcath until your new access is ready for use.   07/09/2021 Jeremy Dorsey [id number] 1951/04/13  Surgeon(s): Serafina Mitchell, MD  Procedure(s): LEFT ARM ARTERIOVENOUS (AV) FISTULA CREATION   May stick graft immediately   May stick graft on designated area only:   X Do not stick left AV Fistula for 12 weeks    If you have any questions, please call the office  at [phone].

## 2021-07-09 NOTE — Anesthesia Procedure Notes (Signed)
Anesthesia Procedure Image    

## 2021-07-09 NOTE — Op Note (Signed)
    Patient name: Jeremy Dorsey MRN: [id number] DOB: 05/30/1951 Sex: male  07/09/2021 Pre-operative Diagnosis: CKD Post-operative diagnosis:  Same Surgeon:  Annamarie Major Assistants:  Ivin Booty, PA Procedure:   Left brachiocephalic fistula Anesthesia:  Regional Blood Loss:  minimal Specimens:  none  Findings: Excellent 4-5 mm cephalic vein.  Normal 3 to 4 mm artery  Indications: The patient has a previous failed radiocephalic fistula.  He comes in today for brachiocephalic fistula.  At baseline he has had some numbness in his left hand since his prior fistula but does not want access in his right arm  Procedure:  The patient was identified in the holding area and taken to [place] [facility]  The patient was then placed supine on the table. regional anesthesia was administered.  The patient was prepped and draped in the usual sterile fashion.  A time out was called and antibiotics were administered.  A PA was necessary to expedite procedure and assist with technical details.  Ultrasound was used to map the course of the cephalic vein in the upper arm.  This was a 3-5 mm vein throughout the upper arm.  A transverse incision was made just proximal to the antecubital crease.  Cautery was used divide subcutaneous tissue.  The brachial artery was then exposed.  This was a disease-free 3 to 4 mm artery.  It was fully mobilized and encircled with Vesseloops.  Next I dissected out the cephalic vein.  It was fully mobilized.  Side branches were ligated between silk ties.  This was a 4-5 mm vein.  It was marked for orientation and then ligated distally with a 2-0 silk tie.  The vein flushed easily.  Next, the brachial artery was occluded with vascular clamps and a #11 blade was used to make an arteriotomy.  This was extended longitudinally with Potts scissors.  The vein was then cut the appropriate length and spatulated to fit the size of the arteriotomy.  A running anastomosis was created with 6-0 Prolene.   Prior to completion, the appropriate flushing maneuvers were performed and the anastomosis was completed.  Upon removing the clamps on the artery, there was a excellent thrill within the vein.  He also had a palpable radial pulse.  I inspected the course of the vein to make sure there were no kinks or additional branches.  Hemostasis was achieved.  The wound was closed with 2 layers of Vicryl followed by Dermabond.  There were no immediate complications.   Disposition: To PACU stable.   Theotis Burrow, M.D., [facility] [place] Office: [phone] Pager:  [phone]

## 2021-07-09 NOTE — Interval H&P Note (Signed)
History and Physical Interval Note:  07/09/2021 8:19 AM  Jeremy Dorsey  has presented today for surgery, with the diagnosis of ESRD.  The various methods of treatment have been discussed with the patient and family. After consideration of risks, benefits and other options for treatment, the patient has consented to  Procedure(s): LEFT ARM ARTERIOVENOUS (AV) FISTULA CREATION (Left) as a surgical intervention.  The patient's history has been reviewed, patient examined, no change in status, stable for surgery.  I have reviewed the patient's chart and labs.  Questions were answered to the patient's satisfaction.     Annamarie Major

## 2021-07-10 ENCOUNTER — Encounter (HOSPITAL_COMMUNITY): Payer: Self-pay | Admitting: Surgery

## 2021-07-18 ENCOUNTER — Encounter (HOSPITAL_COMMUNITY): Payer: Self-pay | Admitting: Surgery

## 2021-07-18 ENCOUNTER — Telehealth: Payer: Self-pay

## 2021-07-18 NOTE — Telephone Encounter (Signed)
Patient's wife calls today to report that patient's arm is swollen around his wrist and hand. Denies any color or temperature change, no wounds, chills, fever or drainage at incision site. Per patient, thrill is still present. Patient has not been elevating his arm. Advised on proper elevation and to do this over the weekend. If patient not experiencing any improvement by Monday they will call back.

## 2021-07-31 ENCOUNTER — Other Ambulatory Visit: Payer: Self-pay | Admitting: *Deleted

## 2021-07-31 DIAGNOSIS — N185 Chronic kidney disease, stage 5: Secondary | ICD-10-CM

## 2021-08-18 ENCOUNTER — Ambulatory Visit (HOSPITAL_COMMUNITY)
Admission: RE | Admit: 2021-08-18 | Discharge: 2021-08-18 | Disposition: A | Discharge status: Home / Self Care | Payer: Medicare HMO | Source: Ambulatory Visit | Attending: Surgery | Admitting: Surgery

## 2021-08-18 ENCOUNTER — Ambulatory Visit (INDEPENDENT_AMBULATORY_CARE_PROVIDER_SITE_OTHER): Payer: Medicare HMO | Admitting: Physician Assistant

## 2021-08-18 ENCOUNTER — Other Ambulatory Visit: Payer: Self-pay

## 2021-08-18 VITALS — BP 158/79 | HR 71 | Temp 98.6°F | Resp 20 | Ht 69.0 in | Wt 229.2 lb

## 2021-08-18 DIAGNOSIS — N185 Chronic kidney disease, stage 5: Secondary | ICD-10-CM | POA: Insufficient documentation

## 2021-08-18 NOTE — Progress Notes (Signed)
°  POST OPERATIVE OFFICE NOTE    CC:  F/u for surgery  HPI:  This is a 71 y.o. male who is s/p left BC AVF on 07/09/2021 by Dr. Trula Slade.  He previously had a left RC AVF on 10/31/2020 by Dr. Donzetta Matters that subsequently failed.     Pt states he does not have pain/numbness in the left hand.  He does have some numbness over the left thumb.  He states that at night his left arm will "go to sleep".  The pt is not on dialysis.  Dr. Moshe Cipro is his nephrologist.    Allergies  Allergen Reactions   Oxycodone Nausea And Vomiting   Hydrocodone Nausea And Vomiting    Current Outpatient Medications  Medication Sig Dispense Refill   aspirin 81 MG EC tablet Take 1 tablet (81 mg total) by mouth daily. Swallow whole. 30 tablet 11   atorvastatin (LIPITOR) 80 MG tablet TAKE 1 TABLET BY MOUTH EVERY DAY 90 tablet 2   calcitRIOL (ROCALTROL) 0.25 MCG capsule Take 0.5 mcg by mouth daily.     clopidogrel (PLAVIX) 75 MG tablet TAKE 1 TABLET BY MOUTH DAILY WITH BREAKFAST. 90 tablet 2   furosemide (LASIX) 40 MG tablet Take 40-80 mg by mouth See admin instructions. Take 80 mg in the morning and 40 mg at night     metoprolol tartrate (LOPRESSOR) 25 MG tablet TAKE 1 TABLET BY MOUTH TWICE A DAY 180 tablet 2   nitroGLYCERIN (NITROSTAT) 0.4 MG SL tablet Place 1 tablet (0.4 mg total) under the tongue every 5 (five) minutes x 3 doses as needed for chest pain. 25 tablet 12   sodium bicarbonate 650 MG tablet Take 650 mg by mouth 2 (two) times daily.      traMADol (ULTRAM) 50 MG tablet Take 1 tablet (50 mg total) by mouth every 6 (six) hours as needed. 8 tablet 0   No current facility-administered medications for this visit.     ROS:  See HPI  Physical Exam:  Today's Vitals   08/18/21 1026  BP: (!) 158/79  Pulse: 71  Resp: 20  Temp: 98.6 F (37 C)  TempSrc: Temporal  SpO2: 99%  Weight: 229 lb 3.2 oz (104 kg)  Height: 5\' 9"  (1.753 m)   Body mass index is 33.85 kg/m.;  Incision:  healed nicely Extremities:    There is a palpable left radial pulse.   Motor and sensory are in tact.   There is an excellent thrill present.  The fistula is easily palpable   Dialysis Duplex on 08/18/2021: Diameter:  0.49-0.80cm Depth:  0.43-1.29cm   Assessment/Plan:  This is a 71 y.o. male who is s/p: Left BC AVF on 07/09/2021 by Dr. Trula Slade  -the pt does not have evidence of steal.  He does have some numbness around the left thumb that is most likely from nerve irritation.   -the fistula has an excellent thrill is easily palpable.  It can be used 09/30/2021.  -discussed with pt that access does not last forever and will need intervention or even new access at some point.  Also discussed TDC if he needed dialysis sooner.  -the pt will follow up as needed.    Leontine Locket, [facility] [phone]  Clinic MD:  Trula Slade

## 2021-10-29 DIAGNOSIS — L089 Local infection of the skin and subcutaneous tissue, unspecified: Secondary | ICD-10-CM | POA: Diagnosis not present

## 2021-10-29 DIAGNOSIS — L570 Actinic keratosis: Secondary | ICD-10-CM | POA: Diagnosis not present

## 2021-10-29 DIAGNOSIS — D2272 Melanocytic nevi of left lower limb, including hip: Secondary | ICD-10-CM | POA: Diagnosis not present

## 2021-10-29 DIAGNOSIS — R58 Hemorrhage, not elsewhere classified: Secondary | ICD-10-CM | POA: Diagnosis not present

## 2021-10-29 DIAGNOSIS — B354 Tinea corporis: Secondary | ICD-10-CM | POA: Diagnosis not present

## 2021-10-29 DIAGNOSIS — D2261 Melanocytic nevi of right upper limb, including shoulder: Secondary | ICD-10-CM | POA: Diagnosis not present

## 2021-10-29 DIAGNOSIS — B359 Dermatophytosis, unspecified: Secondary | ICD-10-CM | POA: Diagnosis not present

## 2021-10-29 DIAGNOSIS — L820 Inflamed seborrheic keratosis: Secondary | ICD-10-CM | POA: Diagnosis not present

## 2021-10-29 DIAGNOSIS — L538 Other specified erythematous conditions: Secondary | ICD-10-CM | POA: Diagnosis not present

## 2021-10-29 DIAGNOSIS — Z85828 Personal history of other malignant neoplasm of skin: Secondary | ICD-10-CM | POA: Diagnosis not present

## 2021-11-11 DIAGNOSIS — D7218 Eosinophilia in diseases classified elsewhere: Secondary | ICD-10-CM | POA: Diagnosis not present

## 2021-11-11 DIAGNOSIS — N184 Chronic kidney disease, stage 4 (severe): Secondary | ICD-10-CM | POA: Diagnosis not present

## 2021-11-11 DIAGNOSIS — E875 Hyperkalemia: Secondary | ICD-10-CM | POA: Diagnosis not present

## 2021-11-11 DIAGNOSIS — R809 Proteinuria, unspecified: Secondary | ICD-10-CM | POA: Diagnosis not present

## 2021-11-11 DIAGNOSIS — N2581 Secondary hyperparathyroidism of renal origin: Secondary | ICD-10-CM | POA: Diagnosis not present

## 2021-11-11 DIAGNOSIS — N3080 Other cystitis without hematuria: Secondary | ICD-10-CM | POA: Diagnosis not present

## 2021-11-11 DIAGNOSIS — I2510 Atherosclerotic heart disease of native coronary artery without angina pectoris: Secondary | ICD-10-CM | POA: Diagnosis not present

## 2021-11-11 DIAGNOSIS — E1122 Type 2 diabetes mellitus with diabetic chronic kidney disease: Secondary | ICD-10-CM | POA: Diagnosis not present

## 2021-11-11 DIAGNOSIS — I129 Hypertensive chronic kidney disease with stage 1 through stage 4 chronic kidney disease, or unspecified chronic kidney disease: Secondary | ICD-10-CM | POA: Diagnosis not present

## 2021-11-18 DIAGNOSIS — N186 End stage renal disease: Secondary | ICD-10-CM | POA: Diagnosis not present

## 2021-11-18 DIAGNOSIS — N185 Chronic kidney disease, stage 5: Secondary | ICD-10-CM | POA: Diagnosis not present

## 2021-11-21 ENCOUNTER — Other Ambulatory Visit: Payer: Self-pay | Admitting: Cardiovascular Disease

## 2021-11-21 DIAGNOSIS — I25118 Atherosclerotic heart disease of native coronary artery with other forms of angina pectoris: Secondary | ICD-10-CM

## 2021-11-27 ENCOUNTER — Other Ambulatory Visit: Payer: Self-pay | Admitting: Family

## 2021-11-27 DIAGNOSIS — I25118 Atherosclerotic heart disease of native coronary artery with other forms of angina pectoris: Secondary | ICD-10-CM

## 2021-11-27 DIAGNOSIS — E785 Hyperlipidemia, unspecified: Secondary | ICD-10-CM

## 2021-12-08 DIAGNOSIS — Z7682 Awaiting organ transplant status: Secondary | ICD-10-CM | POA: Diagnosis not present

## 2021-12-08 DIAGNOSIS — E1122 Type 2 diabetes mellitus with diabetic chronic kidney disease: Secondary | ICD-10-CM | POA: Diagnosis not present

## 2021-12-08 DIAGNOSIS — N185 Chronic kidney disease, stage 5: Secondary | ICD-10-CM | POA: Diagnosis not present

## 2021-12-08 DIAGNOSIS — Z01818 Encounter for other preprocedural examination: Secondary | ICD-10-CM | POA: Diagnosis not present

## 2021-12-08 NOTE — Progress Notes (Signed)
Cardiology Office Note ? ?Date:  12/09/2021  ? ?ID:  Jeremy Dorsey, DOB 1951/03/08, MRN [id number] ? ?PCP:  Jerrol Banana., MD  ? ?Chief Complaint  ?Patient presents with  ? 12 month follow up   ?  Patient c/o LE edema. Medications reviewed by the patient verbally. Patient will start dialysis on May, 15, 2023.   ? ? ?HPI:  ?[name] is a 71 year old gentleman with past medical history of ?Coronary artery disease ?hypertension ?diabetes type 2 ?Chronic kidney disease stage IV ?Morbid obesity ?Smoker, stopped 2009 ?Hyperlipidemia ?Presenting to the hospital March 01, 2020 with arm and jaw pain, non-STEMI ?Catheterization deferred secondary to concern for contrast nephropathy ?Peak troponin 2,200 ?He presents for f/u of  his coronary disease, s/p cath ? ?Last seen by myself in clinic May 2022 ? ?Cath 9/21, prior records reviewed ?1.  Left dominant coronary arteries with severe one-vessel coronary artery disease involving proximal LAD with 99% stenosis and mid LAD with 90% stenosis.  There is moderate LAD disease in the mid to distal segment that was left to be treated medically. ?2.  Left ventricular angiography was not performed due to chronic kidney disease.  LVEDP was moderately elevated at 25 mmHg. ?3.  Successful IVUS guided PCI and drug-eluting stent placement to the mid and proximal LAD. ?  ? ?Dual antiplatelet therapy for at least 1 year. ? ?[facility] ? ?No chest pain, no regular exercise program ?No leg swelling, no PND orthopnea, no abdominal distention ? ?Lab work reviewed ?A1C 6.9 ?Total chol 80s last year ? ?Starting dialysis tomorrow, home unit ?Hematuria yesterday, clearing up, has this from time to time, no prior work-up with urology ?Still makes urine ? ?EKG personally reviewed by myself on todays visit ?Nsr rate 67 no ST or T wave changes ? 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 ? ?PSH:    ?Past Surgical History:  ?Procedure Laterality Date  ? AV FISTULA PLACEMENT Left 10/31/2020  ? Procedure: LEFT UPPER EXTREMITY ARTERIOVENOUS (AV) FISTULA CREATION;  Surgeon: Waynetta Sandy, MD;  Location: [facility];  Service: Vascular;  Laterality: Left;  ? AV FISTULA PLACEMENT Left 07/09/2021  ? Procedure: LEFT ARM ARTERIOVENOUS (AV) FISTULA CREATION;  Surgeon: Serafina Mitchell, MD;  Location: [facility];  Service: Vascular;  Laterality: Left;  ? COLONOSCOPY  1990  ? COLONOSCOPY  06/09/2011  ? Dr Bary Castilla  ? CORONARY STENT INTERVENTION N/A 04/24/2020  ? Procedure: CORONARY STENT INTERVENTION;  Surgeon: Wellington Hampshire, MD;  Location: [facility];  Service: Cardiovascular;  Laterality: N/A;  ? CYSTOSCOPY W/ RETROGRADES Bilateral 08/01/2018  ? Procedure: CYSTOSCOPY WITH RETROGRADE PYELOGRAM;  Surgeon: Billey Co, MD;  Location: [facility];  Service: Urology;  Laterality: Bilateral;  ? CYSTOSCOPY WITH BIOPSY N/A 08/01/2018  ? Procedure: CYSTOSCOPY WITH Bladder BIOPSY;  Surgeon: Billey Co, MD;  Location: [facility];  Service: Urology;  Laterality: N/A;  ? CYSTOSCOPY WITH STENT PLACEMENT Right 08/01/2018  ? Procedure: CYSTOSCOPY WITH STENT PLACEMENT;  Surgeon: Billey Co, MD;  Location: [facility];  Service: Urology;  Laterality: Right;  ? EYE SURGERY Right   ? laser surgery  ? INTRAVASCULAR ULTRASOUND/IVUS N/A 04/24/2020  ? Procedure: Intravascular Ultrasound/IVUS;  Surgeon:  Wellington Hampshire, MD;  Location: [facility];  Service: Cardiovascular;  Laterality: N/A;  ? LEFT HEART CATH AND CORONARY ANGIOGRAPHY N/A 04/24/2020  ? Procedure: LEFT HEART CATH AND CORONARY ANGIOGRAPHY;  Surgeon: Wellington Hampshire, MD;  Location: [facility];  Service: Cardiovascular;   Laterality: N/A;  ? SHOULDER ARTHROSCOPY WITH OPEN ROTATOR CUFF REPAIR Right 08/25/2018  ? Procedure: SHOULDER ARTHROSCOPY WITH MINI OPEN ROTATOR CUFF REPAIR;  Surgeon: Thornton Park, MD;  Location: [facility];  Service: Orthopedics;  Laterality: Right;  ? TRANSURETHRAL RESECTION OF BLADDER TUMOR N/A 08/01/2018  ? Procedure: TRANSURETHRAL RESECTION OF BLADDER TUMOR (TURBT);  Surgeon: Billey Co, MD;  Location: [facility];  Service: Urology;  Laterality: N/A;  ? URETEROSCOPY WITH HOLMIUM LASER LITHOTRIPSY Right 08/01/2018  ? Procedure: URETEROSCOPY WITH HOLMIUM LASER LITHOTRIPSY;  Surgeon: Billey Co, MD;  Location: [facility];  Service: Urology;  Laterality: Right;  ? ? ?Current Outpatient Medications  ?Medication Sig Dispense Refill  ? aspirin 81 MG EC tablet Take 1 tablet (81 mg total) by mouth daily. Swallow whole. 30 tablet 11  ? atorvastatin (LIPITOR) 80 MG tablet TAKE 1 TABLET BY MOUTH EVERY DAY 90 tablet 0  ? calcitRIOL (ROCALTROL) 0.25 MCG capsule Take 0.5 mcg by mouth daily.    ? clopidogrel (PLAVIX) 75 MG tablet TAKE 1 TABLET BY MOUTH EVERY DAY WITH BREAKFAST 90 tablet 0  ? furosemide (LASIX) 40 MG tablet Take 40-80 mg by mouth See admin instructions. Take 80 mg in the morning and 40 mg at night    ? metoprolol tartrate (LOPRESSOR) 25 MG tablet TAKE 1 TABLET BY MOUTH TWICE A DAY 180 tablet 0  ? nitroGLYCERIN (NITROSTAT) 0.4 MG SL tablet Place 1 tablet (0.4 mg total) under the tongue every 5 (five) minutes x 3 doses as needed for chest pain. 25 tablet 12  ? sodium bicarbonate 650 MG tablet Take 650 mg by mouth 2 (two) times daily.     ? traMADol (ULTRAM) 50 MG tablet Take 1 tablet (50 mg total) by mouth every 6 (six) hours as needed. (Patient not taking: Reported on 12/09/2021) 8 tablet 0  ? ?No current facility-administered medications for this visit.  ? ? ? ?Allergies:   Oxycodone and Hydrocodone  ? ?Social History:  The patient  reports that he quit smoking about 14 years ago. His smoking use  included cigarettes. He has a 30.00 pack-year smoking history. He has never used smokeless tobacco. He reports that he does not drink alcohol and does not use drugs.  ? ?Family History:   family history includes Heart failure in his father; Psoriasis in his mother.  ? ? ?Review of Systems: ?Review of Systems  ?Constitutional: Negative.   ?HENT: Negative.    ?Respiratory: Negative.    ?Cardiovascular: Negative.   ?Gastrointestinal: Negative.   ?Musculoskeletal: Negative.   ?Neurological: Negative.   ?Psychiatric/Behavioral: Negative.    ?All other systems reviewed and are negative. ? ?PHYSICAL EXAM: ?VS:  BP (!) 146/68 (BP Location: Left Arm, Patient Position: Sitting, Cuff Size: Normal)   Pulse 67   Ht '5\' 8"'$  (1.727 m)   Wt 230 lb 4 oz (104.4 kg)   SpO2 97%   BMI 35.01 kg/m?  , BMI Body mass index is 35.01 kg/m?Marland Kitchen ?Constitutional:  oriented to person, place, and time. No distress.  ?HENT:  ?Head: Grossly normal ?Eyes:  no discharge. No scleral icterus.  ?Neck: No JVD, no carotid bruits  ?Cardiovascular: Regular rate and rhythm, no  murmurs appreciated ?Pulmonary/Chest: Clear to auscultation bilaterally, no wheezes or rails ?Abdominal: Soft.  no distension.  no tenderness.  ?Musculoskeletal: Normal range of motion ?Neurological:  normal muscle tone. Coordination normal. No atrophy ?Skin: Skin warm and dry ?Psychiatric: normal affect, pleasant ? ?Recent Labs: ?12/12/2020: ALT 21 ?07/09/2021: BUN 113; Creatinine, Ser 7.60; Hemoglobin 12.2; Potassium 4.3; Sodium 139  ? ? ?Lipid Panel ?Lab Results  ?Component Value Date  ? CHOL 81 12/12/2020  ? HDL 25 (A) 12/12/2020  ? [place] 39 12/12/2020  ? TRIG 89 12/12/2020  ? ?  ? ?Wt Readings from Last 3 Encounters:  ?12/09/21 230 lb 4 oz (104.4 kg)  ?08/18/21 229 lb 3.2 oz (104 kg)  ?07/09/21 230 lb (104.3 kg)  ?  ? ?ASSESSMENT AND PLAN: ? ?Problem List Items Addressed This Visit   ? ?  ? Cardiology Problems  ? CAD (coronary artery disease), native coronary artery  ?Coronary  artery disease with stable angina ?Stent x2 placed in the LAD proximal mid region ?Residual 60% mid to distal LAD disease ?Denies anginal symptoms ?Continue aspirin Plavix ?Denies angina, no further testing at this time ?

## 2021-12-09 ENCOUNTER — Encounter: Payer: Self-pay | Admitting: Cardiovascular Disease

## 2021-12-09 ENCOUNTER — Ambulatory Visit: Payer: Medicare HMO | Admitting: Cardiovascular Disease

## 2021-12-09 VITALS — BP 146/68 | HR 67 | Ht 68.0 in | Wt 230.2 lb

## 2021-12-09 DIAGNOSIS — I25118 Atherosclerotic heart disease of native coronary artery with other forms of angina pectoris: Secondary | ICD-10-CM

## 2021-12-09 DIAGNOSIS — E785 Hyperlipidemia, unspecified: Secondary | ICD-10-CM | POA: Diagnosis not present

## 2021-12-09 MED ORDER — METOPROLOL TARTRATE 25 MG PO TABS: 25.0000 mg | ORAL_TABLET | Freq: Two times a day (BID) | ORAL | 3 refills | Status: DC

## 2021-12-09 MED ORDER — ATORVASTATIN CALCIUM 80 MG PO TABS: 80.0000 mg | ORAL_TABLET | Freq: Every day | ORAL | 3 refills | Status: DC

## 2021-12-09 MED ORDER — NITROGLYCERIN 0.4 MG SL SUBL: 0.4000 mg | SUBLINGUAL_TABLET | SUBLINGUAL | 3 refills | Status: DC | PRN

## 2021-12-09 MED ORDER — CLOPIDOGREL BISULFATE 75 MG PO TABS: 75.0000 mg | ORAL_TABLET | Freq: Every day | ORAL | 3 refills | Status: DC

## 2021-12-09 NOTE — Patient Instructions (Signed)
Medication Instructions:  No changes  If you need a refill on your cardiac medications before your next appointment, please call your pharmacy.   Lab work: No new labs needed  Testing/Procedures: No new testing needed  Follow-Up: At [facility], you and your health needs are our priority.  As part of our continuing mission to provide you with exceptional heart care, we have created designated Provider Care Teams.  These Care Teams include your primary Cardiologist (physician) and Advanced Practice Providers (APPs -  Physician Assistants and Nurse Practitioners) who all work together to provide you with the care you need, when you need it.  You will need a follow up appointment in 12 months  Providers on your designated Care Team:   Christopher Berge, NP Ryan Dunn, PA-C Cadence Furth, PA-C  COVID-19 Vaccine Information can be found at: [email or web address] For questions related to vaccine distribution or appointments, please email [email or web address] or call [phone].   

## 2021-12-15 DIAGNOSIS — Z992 Dependence on renal dialysis: Secondary | ICD-10-CM | POA: Diagnosis not present

## 2021-12-15 DIAGNOSIS — N186 End stage renal disease: Secondary | ICD-10-CM | POA: Diagnosis not present

## 2021-12-15 DIAGNOSIS — N2581 Secondary hyperparathyroidism of renal origin: Secondary | ICD-10-CM | POA: Diagnosis not present

## 2021-12-16 DIAGNOSIS — Z992 Dependence on renal dialysis: Secondary | ICD-10-CM | POA: Diagnosis not present

## 2021-12-16 DIAGNOSIS — N186 End stage renal disease: Secondary | ICD-10-CM | POA: Diagnosis not present

## 2021-12-16 DIAGNOSIS — N2581 Secondary hyperparathyroidism of renal origin: Secondary | ICD-10-CM | POA: Diagnosis not present

## 2021-12-18 DIAGNOSIS — Z992 Dependence on renal dialysis: Secondary | ICD-10-CM | POA: Diagnosis not present

## 2021-12-18 DIAGNOSIS — N2581 Secondary hyperparathyroidism of renal origin: Secondary | ICD-10-CM | POA: Diagnosis not present

## 2021-12-18 DIAGNOSIS — N186 End stage renal disease: Secondary | ICD-10-CM | POA: Diagnosis not present

## 2021-12-19 DIAGNOSIS — N186 End stage renal disease: Secondary | ICD-10-CM | POA: Diagnosis not present

## 2021-12-19 DIAGNOSIS — N2581 Secondary hyperparathyroidism of renal origin: Secondary | ICD-10-CM | POA: Diagnosis not present

## 2021-12-19 DIAGNOSIS — Z992 Dependence on renal dialysis: Secondary | ICD-10-CM | POA: Diagnosis not present

## 2021-12-22 DIAGNOSIS — N2581 Secondary hyperparathyroidism of renal origin: Secondary | ICD-10-CM | POA: Diagnosis not present

## 2021-12-22 DIAGNOSIS — Z992 Dependence on renal dialysis: Secondary | ICD-10-CM | POA: Diagnosis not present

## 2021-12-22 DIAGNOSIS — N186 End stage renal disease: Secondary | ICD-10-CM | POA: Diagnosis not present

## 2021-12-23 DIAGNOSIS — N186 End stage renal disease: Secondary | ICD-10-CM | POA: Diagnosis not present

## 2021-12-23 DIAGNOSIS — N2581 Secondary hyperparathyroidism of renal origin: Secondary | ICD-10-CM | POA: Diagnosis not present

## 2021-12-23 DIAGNOSIS — Z992 Dependence on renal dialysis: Secondary | ICD-10-CM | POA: Diagnosis not present

## 2021-12-25 DIAGNOSIS — N2581 Secondary hyperparathyroidism of renal origin: Secondary | ICD-10-CM | POA: Diagnosis not present

## 2021-12-25 DIAGNOSIS — Z992 Dependence on renal dialysis: Secondary | ICD-10-CM | POA: Diagnosis not present

## 2021-12-25 DIAGNOSIS — N186 End stage renal disease: Secondary | ICD-10-CM | POA: Diagnosis not present

## 2021-12-26 DIAGNOSIS — N2581 Secondary hyperparathyroidism of renal origin: Secondary | ICD-10-CM | POA: Diagnosis not present

## 2021-12-26 DIAGNOSIS — N186 End stage renal disease: Secondary | ICD-10-CM | POA: Diagnosis not present

## 2021-12-26 DIAGNOSIS — Z992 Dependence on renal dialysis: Secondary | ICD-10-CM | POA: Diagnosis not present

## 2021-12-30 DIAGNOSIS — Z992 Dependence on renal dialysis: Secondary | ICD-10-CM | POA: Diagnosis not present

## 2021-12-30 DIAGNOSIS — N186 End stage renal disease: Secondary | ICD-10-CM | POA: Diagnosis not present

## 2021-12-30 DIAGNOSIS — N2581 Secondary hyperparathyroidism of renal origin: Secondary | ICD-10-CM | POA: Diagnosis not present

## 2021-12-31 DIAGNOSIS — N186 End stage renal disease: Secondary | ICD-10-CM | POA: Diagnosis not present

## 2021-12-31 DIAGNOSIS — Z992 Dependence on renal dialysis: Secondary | ICD-10-CM | POA: Diagnosis not present

## 2021-12-31 DIAGNOSIS — N2581 Secondary hyperparathyroidism of renal origin: Secondary | ICD-10-CM | POA: Diagnosis not present

## 2022-01-01 DIAGNOSIS — N186 End stage renal disease: Secondary | ICD-10-CM | POA: Diagnosis not present

## 2022-01-01 DIAGNOSIS — Z992 Dependence on renal dialysis: Secondary | ICD-10-CM | POA: Diagnosis not present

## 2022-01-01 DIAGNOSIS — N2581 Secondary hyperparathyroidism of renal origin: Secondary | ICD-10-CM | POA: Diagnosis not present

## 2022-01-02 DIAGNOSIS — I871 Compression of vein: Secondary | ICD-10-CM | POA: Diagnosis not present

## 2022-01-02 DIAGNOSIS — N2581 Secondary hyperparathyroidism of renal origin: Secondary | ICD-10-CM | POA: Diagnosis not present

## 2022-01-02 DIAGNOSIS — N186 End stage renal disease: Secondary | ICD-10-CM | POA: Diagnosis not present

## 2022-01-02 DIAGNOSIS — Z992 Dependence on renal dialysis: Secondary | ICD-10-CM | POA: Diagnosis not present

## 2022-01-05 ENCOUNTER — Ambulatory Visit (INDEPENDENT_AMBULATORY_CARE_PROVIDER_SITE_OTHER): Payer: Medicare HMO | Admitting: Family Medicine

## 2022-01-05 ENCOUNTER — Encounter: Payer: Self-pay | Admitting: Family Medicine

## 2022-01-05 VITALS — BP 119/74 | HR 102 | Temp 98.5°F | Resp 16 | Ht 68.0 in | Wt 232.5 lb

## 2022-01-05 DIAGNOSIS — Z992 Dependence on renal dialysis: Secondary | ICD-10-CM | POA: Diagnosis not present

## 2022-01-05 DIAGNOSIS — N200 Calculus of kidney: Secondary | ICD-10-CM

## 2022-01-05 DIAGNOSIS — E119 Type 2 diabetes mellitus without complications: Secondary | ICD-10-CM | POA: Diagnosis not present

## 2022-01-05 DIAGNOSIS — E782 Mixed hyperlipidemia: Secondary | ICD-10-CM | POA: Diagnosis not present

## 2022-01-05 DIAGNOSIS — E669 Obesity, unspecified: Secondary | ICD-10-CM | POA: Diagnosis not present

## 2022-01-05 DIAGNOSIS — Z01818 Encounter for other preprocedural examination: Secondary | ICD-10-CM

## 2022-01-05 DIAGNOSIS — I739 Peripheral vascular disease, unspecified: Secondary | ICD-10-CM

## 2022-01-05 DIAGNOSIS — R300 Dysuria: Secondary | ICD-10-CM

## 2022-01-05 DIAGNOSIS — R319 Hematuria, unspecified: Secondary | ICD-10-CM

## 2022-01-05 DIAGNOSIS — N185 Chronic kidney disease, stage 5: Secondary | ICD-10-CM | POA: Diagnosis not present

## 2022-01-05 DIAGNOSIS — N186 End stage renal disease: Secondary | ICD-10-CM | POA: Diagnosis not present

## 2022-01-05 DIAGNOSIS — N2581 Secondary hyperparathyroidism of renal origin: Secondary | ICD-10-CM | POA: Diagnosis not present

## 2022-01-05 DIAGNOSIS — I2510 Atherosclerotic heart disease of native coronary artery without angina pectoris: Secondary | ICD-10-CM

## 2022-01-05 DIAGNOSIS — Z1211 Encounter for screening for malignant neoplasm of colon: Secondary | ICD-10-CM

## 2022-01-05 LAB — POCT URINALYSIS DIPSTICK
Bilirubin, UA: NEGATIVE
Blood, UA: POSITIVE
Glucose, UA: POSITIVE — AB
Ketones, UA: NEGATIVE
Nitrite, UA: NEGATIVE
Protein, UA: POSITIVE — AB
Spec Grav, UA: 1.01 (ref 1.010–1.025)
Urobilinogen, UA: 0.2 E.U./dL
pH, UA: 7 (ref 5.0–8.0)

## 2022-01-05 NOTE — Progress Notes (Signed)
I,Tiffany J Bragg,acting as a scribe for Wilhemena Durie, MD.,have documented all relevant documentation on the behalf of Wilhemena Durie, MD,as directed by  Wilhemena Durie, MD while in the presence of Wilhemena Durie, MD.  Established patient visit   Patient: Jeremy Dorsey   DOB: [birth year]   71 y.o. Male  MRN: [id number] Visit Date: 01/05/2022  Today's healthcare provider: Wilhemena Durie, MD   Chief Complaint  Patient presents with   Toe Injury    Patient states his second toe on R foot is red.    Hematuria    Patient complains of blood in urine starting 3 days ago.    Subjective    HPI This is my first visit for this gentleman with multiple medical problems with the Dorsey thing being in end-stage renal disease for which she has been on dialysis since the middle of May.  He is on the transplant list for a kidney.  He has several issues today.  He evidently for clearance needs to be screened for his colonoscopy which was last done 11 years ago.  He would like to see Dr. Bary Castilla who did his last one. He is describing some PAD and has an established vascular surgeon and Dr. Trula Slade in [place]. He also has had some recent hematuria.  He has seen local urology  HPI     Toe Injury    Additional comments: Patient states his second toe on R foot is red.         Hematuria    Additional comments: Patient complains of blood in urine starting 3 days ago.         Comments   Patient states he is trying to get on the transplant list and the hospital is requesting that he has a colonoscopy first.       Last edited by Smitty Knudsen, CMA on 01/05/2022  3:27 PM.        Medications: Outpatient Medications Prior to Visit  Medication Sig   aspirin 81 MG EC tablet Take 1 tablet (81 mg total) by mouth daily. Swallow whole.   atorvastatin (LIPITOR) 80 MG tablet Take 1 tablet (80 mg total) by mouth daily.   calcitRIOL (ROCALTROL) 0.25 MCG capsule Take 0.5  mcg by mouth daily.   clopidogrel (PLAVIX) 75 MG tablet Take 1 tablet (75 mg total) by mouth daily.   furosemide (LASIX) 40 MG tablet Take 40-80 mg by mouth See admin instructions. Take 80 mg in the morning and 40 mg at night   metoprolol tartrate (LOPRESSOR) 25 MG tablet Take 1 tablet (25 mg total) by mouth 2 (two) times daily.   nitroGLYCERIN (NITROSTAT) 0.4 MG SL tablet Place 1 tablet (0.4 mg total) under the tongue every 5 (five) minutes x 3 doses as needed for chest pain.   sodium bicarbonate 650 MG tablet Take 650 mg by mouth 2 (two) times daily.    traMADol (ULTRAM) 50 MG tablet Take 1 tablet (50 mg total) by mouth every 6 (six) hours as needed. (Patient not taking: Reported on 12/09/2021)   No facility-administered medications prior to visit.    Review of Systems  Last metabolic panel Lab Results  Component Value Date   GLUCOSE 182 (H) 07/09/2021   NA 139 07/09/2021   K 4.3 07/09/2021   CL 109 07/09/2021   CO2 15 12/12/2020   BUN 113 (H) 07/09/2021   CREATININE 7.60 (H) 07/09/2021   GFRNONAA 11 12/12/2020  CALCIUM 9.4 12/12/2020   PHOS 4.2 04/07/2016   PROT 7.2 08/03/2018   ALBUMIN 3.9 12/12/2020   BILITOT 0.5 08/03/2018   ALKPHOS 82 12/12/2020   AST 14 12/12/2020   ALT 21 12/12/2020   ANIONGAP 10 04/29/2020       Objective    There were no vitals taken for this visit. BP Readings from Last 3 Encounters:  01/05/22 119/74  12/09/21 (!) 146/68  08/18/21 (!) 158/79   Wt Readings from Last 3 Encounters:  01/05/22 232 lb 8 oz (105.5 kg)  12/09/21 230 lb 4 oz (104.4 kg)  08/18/21 229 lb 3.2 oz (104 kg)      Physical Exam Vitals reviewed.  Constitutional:      General: He is not in acute distress.    Appearance: He is well-developed.  HENT:     Head: Normocephalic and atraumatic.     Right Ear: Hearing normal.     Left Ear: Hearing normal.     Nose: Nose normal.  Eyes:     General: Lids are normal. No scleral icterus.       Right eye: No discharge.         Left eye: No discharge.     Conjunctiva/sclera: Conjunctivae normal.  Cardiovascular:     Rate and Rhythm: Normal rate and regular rhythm.     Heart sounds: Normal heart sounds.  Pulmonary:     Effort: Pulmonary effort is normal. No respiratory distress.  Skin:    General: Skin is warm and dry.     Capillary Refill: Capillary refill takes 2 to 3 seconds.     Findings: No lesion or rash.  Neurological:     General: No focal deficit present.     Mental Status: He is alert and oriented to person, place, and time.  Psychiatric:        Mood and Affect: Mood normal.        Speech: Speech normal.        Behavior: Behavior normal.        Thought Content: Thought content normal.        Judgment: Judgment normal.       No results found for any visits on 01/05/22.  Assessment & Plan     1. Dysuria  - POCT Urinalysis Dipstick  2. Hematuria, unspecified type For back to urology and patient with history of renal stones and end-stage renal disease now - Ambulatory referral to Urology - Urine Culture  3. Screening for colon cancer 2012 was last screening. - Ambulatory referral to Gastroenterology  4. Coronary artery disease, unspecified vessel or lesion type, unspecified whether angina present, unspecified whether native or transplanted heart Clinically stable today - Ambulatory referral to Vascular Surgery  5. Obesity (BMI 35.0-39.9 without comorbidity)   6. Type 2 diabetes mellitus without complication, without long-term current use of insulin (HCC) Goal A1c less than 7-7.5  7. Coronary artery disease involving native coronary artery of native heart without angina pectoris All risk factors treated  8. CKD (chronic kidney disease), stage V (HCC) End-stage renal disease on hemodialysis now  9. Pre-transplant evaluation for CKD (chronic kidney disease)   10. Mixed hyperlipidemia   11. Kidney stones History of kidney stones.  Pending urology referral for hematuria 12.   PAD Her back to vascular surgery  No follow-ups on file.      I, Wilhemena Durie, MD, have reviewed all documentation for this visit. The documentation on 01/08/22 for the exam, diagnosis,  procedures, and orders are all accurate and complete.    Aarin Bluett Cranford Mon, MD  [facility] [phone] (phone) [phone] (fax)  [place]

## 2022-01-06 DIAGNOSIS — N186 End stage renal disease: Secondary | ICD-10-CM | POA: Diagnosis not present

## 2022-01-06 DIAGNOSIS — Z992 Dependence on renal dialysis: Secondary | ICD-10-CM | POA: Diagnosis not present

## 2022-01-06 DIAGNOSIS — N2581 Secondary hyperparathyroidism of renal origin: Secondary | ICD-10-CM | POA: Diagnosis not present

## 2022-01-08 ENCOUNTER — Other Ambulatory Visit: Payer: Self-pay | Admitting: *Deleted

## 2022-01-08 ENCOUNTER — Telehealth: Payer: Self-pay

## 2022-01-08 DIAGNOSIS — N186 End stage renal disease: Secondary | ICD-10-CM | POA: Diagnosis not present

## 2022-01-08 DIAGNOSIS — N390 Urinary tract infection, site not specified: Secondary | ICD-10-CM

## 2022-01-08 DIAGNOSIS — N2581 Secondary hyperparathyroidism of renal origin: Secondary | ICD-10-CM | POA: Diagnosis not present

## 2022-01-08 DIAGNOSIS — Z992 Dependence on renal dialysis: Secondary | ICD-10-CM | POA: Diagnosis not present

## 2022-01-08 LAB — URINE CULTURE

## 2022-01-08 MED ORDER — CEPHALEXIN 500 MG PO CAPS: 500.0000 mg | ORAL_CAPSULE | Freq: Two times a day (BID) | ORAL | 0 refills | Status: DC

## 2022-01-08 NOTE — Telephone Encounter (Signed)
Copied from [place] [id number]. Topic: General - Call Back - No Documentation >> Jan 08, 2022 12:05 PM Devoria Glassing wrote: Reason for CRM: pt saw his lab results in epic.  Would like what to do about the e coli result? If pt is going to have to take abx for this, they will need to cancel his appt at the urologist on 6/13. Please call back

## 2022-01-08 NOTE — Telephone Encounter (Signed)
Please advise 

## 2022-01-08 NOTE — Telephone Encounter (Signed)
See result note.  

## 2022-01-09 DIAGNOSIS — N186 End stage renal disease: Secondary | ICD-10-CM | POA: Diagnosis not present

## 2022-01-09 DIAGNOSIS — Z992 Dependence on renal dialysis: Secondary | ICD-10-CM | POA: Diagnosis not present

## 2022-01-09 DIAGNOSIS — N2581 Secondary hyperparathyroidism of renal origin: Secondary | ICD-10-CM | POA: Diagnosis not present

## 2022-01-12 DIAGNOSIS — Z992 Dependence on renal dialysis: Secondary | ICD-10-CM | POA: Diagnosis not present

## 2022-01-12 DIAGNOSIS — N186 End stage renal disease: Secondary | ICD-10-CM | POA: Diagnosis not present

## 2022-01-12 DIAGNOSIS — N2581 Secondary hyperparathyroidism of renal origin: Secondary | ICD-10-CM | POA: Diagnosis not present

## 2022-01-13 DIAGNOSIS — Z992 Dependence on renal dialysis: Secondary | ICD-10-CM | POA: Diagnosis not present

## 2022-01-13 DIAGNOSIS — N186 End stage renal disease: Secondary | ICD-10-CM | POA: Diagnosis not present

## 2022-01-13 DIAGNOSIS — N2581 Secondary hyperparathyroidism of renal origin: Secondary | ICD-10-CM | POA: Diagnosis not present

## 2022-01-14 ENCOUNTER — Ambulatory Visit: Payer: PPO | Admitting: Urology

## 2022-01-15 DIAGNOSIS — N186 End stage renal disease: Secondary | ICD-10-CM | POA: Diagnosis not present

## 2022-01-15 DIAGNOSIS — Z992 Dependence on renal dialysis: Secondary | ICD-10-CM | POA: Diagnosis not present

## 2022-01-15 DIAGNOSIS — N2581 Secondary hyperparathyroidism of renal origin: Secondary | ICD-10-CM | POA: Diagnosis not present

## 2022-01-16 DIAGNOSIS — N2581 Secondary hyperparathyroidism of renal origin: Secondary | ICD-10-CM | POA: Diagnosis not present

## 2022-01-16 DIAGNOSIS — Z992 Dependence on renal dialysis: Secondary | ICD-10-CM | POA: Diagnosis not present

## 2022-01-16 DIAGNOSIS — N186 End stage renal disease: Secondary | ICD-10-CM | POA: Diagnosis not present

## 2022-01-19 ENCOUNTER — Telehealth: Payer: Self-pay | Admitting: *Deleted

## 2022-01-19 DIAGNOSIS — Z992 Dependence on renal dialysis: Secondary | ICD-10-CM | POA: Diagnosis not present

## 2022-01-19 DIAGNOSIS — N2581 Secondary hyperparathyroidism of renal origin: Secondary | ICD-10-CM | POA: Diagnosis not present

## 2022-01-19 DIAGNOSIS — N186 End stage renal disease: Secondary | ICD-10-CM | POA: Diagnosis not present

## 2022-01-19 NOTE — Telephone Encounter (Signed)
Please advise 

## 2022-01-19 NOTE — Telephone Encounter (Signed)
Copied from [place] [id number]. Topic: General - Call Back - No Documentation >> Jan 19, 2022 10:47 AM Sabas Sous wrote: Reason for CRM: Pt's wife called to report that he is needing to go to a vein specialist for his foot. Nothing charted   Best contact: [phone]

## 2022-01-20 DIAGNOSIS — N186 End stage renal disease: Secondary | ICD-10-CM | POA: Diagnosis not present

## 2022-01-20 DIAGNOSIS — Z992 Dependence on renal dialysis: Secondary | ICD-10-CM | POA: Diagnosis not present

## 2022-01-20 DIAGNOSIS — N2581 Secondary hyperparathyroidism of renal origin: Secondary | ICD-10-CM | POA: Diagnosis not present

## 2022-01-22 DIAGNOSIS — N186 End stage renal disease: Secondary | ICD-10-CM | POA: Diagnosis not present

## 2022-01-22 DIAGNOSIS — N2581 Secondary hyperparathyroidism of renal origin: Secondary | ICD-10-CM | POA: Diagnosis not present

## 2022-01-22 DIAGNOSIS — Z992 Dependence on renal dialysis: Secondary | ICD-10-CM | POA: Diagnosis not present

## 2022-01-23 ENCOUNTER — Other Ambulatory Visit: Payer: Self-pay | Admitting: *Deleted

## 2022-01-23 DIAGNOSIS — N185 Chronic kidney disease, stage 5: Secondary | ICD-10-CM

## 2022-01-26 DIAGNOSIS — Z992 Dependence on renal dialysis: Secondary | ICD-10-CM | POA: Diagnosis not present

## 2022-01-26 DIAGNOSIS — N186 End stage renal disease: Secondary | ICD-10-CM | POA: Diagnosis not present

## 2022-01-26 DIAGNOSIS — N2581 Secondary hyperparathyroidism of renal origin: Secondary | ICD-10-CM | POA: Diagnosis not present

## 2022-01-27 ENCOUNTER — Other Ambulatory Visit: Payer: Self-pay

## 2022-01-27 ENCOUNTER — Ambulatory Visit: Payer: Medicare HMO | Admitting: Vascular Surgery

## 2022-01-27 ENCOUNTER — Ambulatory Visit (HOSPITAL_COMMUNITY)
Admission: RE | Admit: 2022-01-27 | Discharge: 2022-01-27 | Disposition: A | Discharge status: Home / Self Care | Payer: Medicare HMO | Source: Ambulatory Visit | Attending: Vascular Surgery | Admitting: Vascular Surgery

## 2022-01-27 ENCOUNTER — Encounter: Payer: Self-pay | Admitting: Vascular Surgery

## 2022-01-27 DIAGNOSIS — I70221 Atherosclerosis of native arteries of extremities with rest pain, right leg: Secondary | ICD-10-CM

## 2022-01-27 DIAGNOSIS — N185 Chronic kidney disease, stage 5: Secondary | ICD-10-CM | POA: Insufficient documentation

## 2022-01-27 MED ORDER — SODIUM CHLORIDE 0.9% FLUSH: 3.0000 mL | INTRAVENOUS | Status: DC | PRN

## 2022-01-27 MED ORDER — SODIUM CHLORIDE 0.9% FLUSH: 3.0000 mL | Freq: Two times a day (BID) | INTRAVENOUS | Status: DC

## 2022-01-27 MED ORDER — SODIUM CHLORIDE 0.9 % IV SOLN: 250.0000 mL | INTRAVENOUS | Status: DC | PRN

## 2022-01-27 NOTE — Progress Notes (Signed)
Patient name: Jeremy Dorsey MRN: [id number] DOB: 1950/08/31 Sex: male  REASON FOR VISIT: Evaluate critical limb ischemia right foot  HPI: Jeremy Dorsey is a 71 y.o. male with history of end-stage renal disease on home dialysis Monday/Tuesday/Thursday/Friday, diabetes, hypertension, hyperlipidemia that presents for evaluation of color change and pain in his right foot.  He states his right great toe started changing colors about 3 weeks ago and now he has redness on his first second and third toes that are all painful.  States he cannot sleep.  His wife had to drive home froma recent trip and states he could not drive the car.  He has had no previous lower extremity interventions.  Well-known to our practice with previous dialysis access.  ABI's today are non-compressible with monophasic waveforms right foot and triphasic waveforms left foot.  Past Medical History:  Diagnosis Date   Anemia    Cancer (HCC)    basal cell nose and forehead   CKD (chronic kidney disease), stage IV (HCC)    Coronary artery disease    a. s/p IVUS-guided DESx2 to prox & mid LAD, residual disease treated medically. EF 50-55% by recent echo 02/2020.   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   Urinary complication     Past Surgical History:  Procedure Laterality Date   AV FISTULA PLACEMENT Left 10/31/2020   Procedure: LEFT UPPER EXTREMITY ARTERIOVENOUS (AV) FISTULA CREATION;  Surgeon: Maeola Harman, MD;  Location: [facility] OR;  Service: Vascular;  Laterality: Left;   AV FISTULA PLACEMENT Left 07/09/2021   Procedure: LEFT ARM ARTERIOVENOUS (AV) FISTULA CREATION;  Surgeon: Nada Libman, MD;  Location: [facility] OR;  Service: Vascular;  Laterality: Left;   COLONOSCOPY  1990   COLONOSCOPY  06/09/2011   Dr Lemar Livings   CORONARY STENT INTERVENTION N/A  04/24/2020   Procedure: CORONARY STENT INTERVENTION;  Surgeon: Iran Ouch, MD;  Location: [facility] INVASIVE CV LAB;  Service: Cardiovascular;  Laterality: N/A;   CYSTOSCOPY W/ RETROGRADES Bilateral 08/01/2018   Procedure: CYSTOSCOPY WITH RETROGRADE PYELOGRAM;  Surgeon: Sondra Come, MD;  Location: [facility] ORS;  Service: Urology;  Laterality: Bilateral;   CYSTOSCOPY WITH BIOPSY N/A 08/01/2018   Procedure: CYSTOSCOPY WITH Bladder BIOPSY;  Surgeon: Sondra Come, MD;  Location: [facility] ORS;  Service: Urology;  Laterality: N/A;   CYSTOSCOPY WITH STENT PLACEMENT Right 08/01/2018   Procedure: CYSTOSCOPY WITH STENT PLACEMENT;  Surgeon: Sondra Come, MD;  Location: [facility] ORS;  Service: Urology;  Laterality: Right;   EYE SURGERY Right    laser surgery   INTRAVASCULAR ULTRASOUND/IVUS N/A 04/24/2020   Procedure: Intravascular Ultrasound/IVUS;  Surgeon: Iran Ouch, MD;  Location: [facility] INVASIVE CV LAB;  Service: Cardiovascular;  Laterality: N/A;   LEFT HEART CATH AND CORONARY ANGIOGRAPHY N/A 04/24/2020   Procedure: LEFT HEART CATH AND CORONARY ANGIOGRAPHY;  Surgeon: Iran Ouch, MD;  Location: [facility] IN[facility];  Service: Cardiovascular;  Laterality: N/A;   SHOULDER ARTHROSCOPY WITH OPEN ROTATOR CUFF REPAIR Right 08/25/2018   Procedure: SHOULDER ARTHROSCOPY WITH MINI OPEN ROTATOR CUFF REPAIR;  Surgeon: Juanell Fairly, MD;  Location: [facility] ORS;  Service: Orthopedics;  Laterality: Right;   TRANSURETHRAL RESECTION OF BLADDER TUMOR N/A 08/01/2018   Procedure: TRANSURETHRAL RESECTION OF BLADDER TUMOR (TURBT);  Surgeon: Sondra Come, MD;  Location: [facility] ORS;  Service: Urology;  Laterality: N/A;   URETEROSCOPY WITH HOLMIUM LASER LITHOTRIPSY Right 08/01/2018   Procedure: URETEROSCOPY WITH HOLMIUM LASER LITHOTRIPSY;  Surgeon: Sondra Come, MD;  Location: [facility] ORS;  Service: Urology;  Laterality: Right;    Family History  Problem Relation Age of Onset   Psoriasis Mother    Heart failure  Father     SOCIAL HISTORY: Social History   Tobacco Use   Smoking status: Former    Packs/day: 1.00    Years: 30.00    Total pack years: 30.00    Types: Cigarettes    Quit date: 08/23/2007    Years since quitting: 14.4   Smokeless tobacco: Never  Substance Use Topics   Alcohol use: No    Allergies  Allergen Reactions   Oxycodone Nausea And Vomiting   Hydrocodone Nausea And Vomiting    Current Outpatient Medications  Medication Sig Dispense Refill   aspirin 81 MG EC tablet Take 1 tablet (81 mg total) by mouth daily. Swallow whole. 30 tablet 11   atorvastatin (LIPITOR) 80 MG tablet Take 1 tablet (80 mg total) by mouth daily. 90 tablet 3   calcitRIOL (ROCALTROL) 0.25 MCG capsule Take 0.5 mcg by mouth daily.     calcitRIOL (ROCALTROL) 0.25 MCG capsule Take by mouth.     clopidogrel (PLAVIX) 75 MG tablet Take 1 tablet (75 mg total) by mouth daily. 90 tablet 3   metoprolol tartrate (LOPRESSOR) 25 MG tablet Take 1 tablet (25 mg total) by mouth 2 (two) times daily. 180 tablet 3   nitroGLYCERIN (NITROSTAT) 0.4 MG SL tablet Place 1 tablet (0.4 mg total) under the tongue every 5 (five) minutes x 3 doses as needed for chest pain. 25 tablet 3   sevelamer carbonate (RENVELA) 800 MG tablet Take by mouth.     cephALEXin (KEFLEX) 500 MG capsule Take 1 capsule (500 mg total) by mouth 2 (two) times daily. (Patient not taking: Reported on 01/27/2022) 14 capsule 0   furosemide (LASIX) 40 MG tablet Take 40-80 mg by mouth See admin instructions. Take 80 mg in the morning and 40 mg at night     sodium bicarbonate 650 MG tablet Take 650 mg by mouth 2 (two) times daily.      torsemide (DEMADEX) 100 MG tablet Take 100 mg by mouth every morning.     traMADol (ULTRAM) 50 MG tablet Take 1 tablet (50 mg total) by mouth every 6 (six) hours as needed. (Patient not taking: Reported on 12/09/2021) 8 tablet 0   No current facility-administered medications for this visit.    REVIEW OF SYSTEMS:  [X]  denotes  positive finding, [ ]  denotes negative finding Cardiac  Comments:  Chest pain or chest pressure:    Shortness of breath upon exertion:    Short of breath when lying flat:    Irregular heart rhythm:        Vascular    Pain in calf, thigh, or hip brought on by ambulation:    Pain in feet at night that wakes you up from your sleep:  x Right foot  Blood clot in your veins:    Leg swelling:         Pulmonary    Oxygen at home:    Productive cough:     Wheezing:         Neurologic    Sudden weakness in arms or legs:     Sudden numbness in  arms or legs:     Sudden onset of difficulty speaking or slurred speech:    Temporary loss of vision in one eye:     Problems with dizziness:         Gastrointestinal    Blood in stool:     Vomited blood:         Genitourinary    Burning when urinating:     Blood in urine:        Psychiatric    Major depression:         Hematologic    Bleeding problems:    Problems with blood clotting too easily:        Skin    Rashes or ulcers:        Constitutional    Fever or chills:      PHYSICAL EXAM: Vitals:   01/27/22 1212  BP: 120/68  Pulse: 94  Resp: 18  Temp: 97.6 F (36.4 C)  TempSrc: Temporal  SpO2: 91%  Weight: 225 lb (102.1 kg)  Height: 5\' 8"  (1.727 m)    GENERAL: The patient is a well-nourished male, in no acute distress. The vital signs are documented above. CARDIAC: There is a regular rate and rhythm.  VASCULAR:  Palpable femoral pulses bilaterally Right popliteal palpable No palpable pedal pulses PULMONARY: No respiratory distress. ABDOMEN: Soft and non-tender. MUSCULOSKELETAL: There are no major deformities or cyanosis. NEUROLOGIC: No focal weakness or paresthesias are detected. PSYCHIATRIC: The patient has a normal affect.     DATA:   ABIs today are noncompressible with monophasic waveforms in the right foot and triphasic waveforms in the left foot  Assessment/Plan:  71 year old male with end-stage renal  disease and other comorbidities that presents with evidence of critical limb ischemia with rest pain in the right foot progressing over the last 3 weeks.  He has significant dependent rubor in the first second and third toes as well as pictured above.  I have recommended aortogram, lower extremity arteriogram, and possible intervention from a transfemoral approach with a focus on the right leg.  Discussed if he has no endovascular options may require open surgical bypass.  Discussed this being done at [facility].  Discussed that being done on an outpatient basis.  Risk and benefits discussed including risk of bleeding from transfemoral access.  We will get scheduled for Thursday.  Questions answered.   Cephus Shelling, MD [facility] Office: [phone]

## 2022-01-28 DIAGNOSIS — N186 End stage renal disease: Secondary | ICD-10-CM | POA: Diagnosis not present

## 2022-01-28 DIAGNOSIS — Z992 Dependence on renal dialysis: Secondary | ICD-10-CM | POA: Diagnosis not present

## 2022-01-28 DIAGNOSIS — N2581 Secondary hyperparathyroidism of renal origin: Secondary | ICD-10-CM | POA: Diagnosis not present

## 2022-01-29 ENCOUNTER — Other Ambulatory Visit: Payer: Self-pay

## 2022-01-29 ENCOUNTER — Ambulatory Visit (HOSPITAL_COMMUNITY)
Admission: RE | Admit: 2022-01-29 | Discharge: 2022-01-29 | Disposition: A | Discharge status: Home / Self Care | Payer: Medicare HMO | Source: Ambulatory Visit | Attending: Vascular Surgery | Admitting: Vascular Surgery

## 2022-01-29 ENCOUNTER — Ambulatory Visit (HOSPITAL_COMMUNITY)
Admission: RE | Disposition: A | Discharge status: Home / Self Care | Payer: Self-pay | Source: Ambulatory Visit | Attending: Vascular Surgery

## 2022-01-29 DIAGNOSIS — Z87891 Personal history of nicotine dependence: Secondary | ICD-10-CM | POA: Insufficient documentation

## 2022-01-29 DIAGNOSIS — E1151 Type 2 diabetes mellitus with diabetic peripheral angiopathy without gangrene: Secondary | ICD-10-CM | POA: Insufficient documentation

## 2022-01-29 DIAGNOSIS — I70221 Atherosclerosis of native arteries of extremities with rest pain, right leg: Secondary | ICD-10-CM | POA: Diagnosis not present

## 2022-01-29 DIAGNOSIS — Z992 Dependence on renal dialysis: Secondary | ICD-10-CM | POA: Insufficient documentation

## 2022-01-29 DIAGNOSIS — I120 Hypertensive chronic kidney disease with stage 5 chronic kidney disease or end stage renal disease: Secondary | ICD-10-CM | POA: Insufficient documentation

## 2022-01-29 DIAGNOSIS — N186 End stage renal disease: Secondary | ICD-10-CM | POA: Diagnosis not present

## 2022-01-29 DIAGNOSIS — E1122 Type 2 diabetes mellitus with diabetic chronic kidney disease: Secondary | ICD-10-CM | POA: Insufficient documentation

## 2022-01-29 DIAGNOSIS — E785 Hyperlipidemia, unspecified: Secondary | ICD-10-CM | POA: Diagnosis not present

## 2022-01-29 HISTORY — PX: ABDOMINAL AORTOGRAM W/LOWER EXTREMITY: CATH118223

## 2022-01-29 HISTORY — PX: PERIPHERAL VASCULAR BALLOON ANGIOPLASTY: CATH118281

## 2022-01-29 LAB — GLUCOSE, CAPILLARY
Glucose-Capillary: 224 mg/dL — ABNORMAL HIGH (ref 70–99)
Glucose-Capillary: 237 mg/dL — ABNORMAL HIGH (ref 70–99)

## 2022-01-29 LAB — POCT I-STAT, CHEM 8
BUN: 28 mg/dL — ABNORMAL HIGH (ref 8–23)
Calcium, Ion: 1.18 mmol/L (ref 1.15–1.40)
Chloride: 95 mmol/L — ABNORMAL LOW (ref 98–111)
Creatinine, Ser: 6.1 mg/dL — ABNORMAL HIGH (ref 0.61–1.24)
Glucose, Bld: 227 mg/dL — ABNORMAL HIGH (ref 70–99)
HCT: 28 % — ABNORMAL LOW (ref 39.0–52.0)
Hemoglobin: 9.5 g/dL — ABNORMAL LOW (ref 13.0–17.0)
Potassium: 4 mmol/L (ref 3.5–5.1)
Sodium: 134 mmol/L — ABNORMAL LOW (ref 135–145)
TCO2: 29 mmol/L (ref 22–32)

## 2022-01-29 LAB — POCT ACTIVATED CLOTTING TIME
Activated Clotting Time: 209 seconds
Activated Clotting Time: 215 seconds

## 2022-01-29 SURGERY — ABDOMINAL AORTOGRAM W/LOWER EXTREMITY
Anesthesia: LOCAL | Laterality: Right

## 2022-01-29 MED ORDER — MIDAZOLAM HCL 2 MG/2ML IJ SOLN
INTRAMUSCULAR | Status: DC | PRN
  Administered 2022-01-29 (×2): 1 mg via INTRAVENOUS

## 2022-01-29 MED ORDER — HYDRALAZINE HCL 20 MG/ML IJ SOLN: 5.0000 mg | INTRAMUSCULAR | Status: DC | PRN

## 2022-01-29 MED ORDER — CLOPIDOGREL BISULFATE 75 MG PO TABS
ORAL_TABLET | ORAL | Status: DC | PRN
  Administered 2022-01-29: 75 mg via ORAL

## 2022-01-29 MED ORDER — HEPARIN SODIUM (PORCINE) 1000 UNIT/ML IJ SOLN
INTRAMUSCULAR | Status: DC | PRN
  Administered 2022-01-29: 3000 [IU] via INTRAVENOUS
  Administered 2022-01-29: 10000 [IU] via INTRAVENOUS

## 2022-01-29 MED ORDER — CLOPIDOGREL BISULFATE 75 MG PO TABS
ORAL_TABLET | ORAL | Status: AC
  Filled 2022-01-29: qty 1

## 2022-01-29 MED ORDER — ONDANSETRON HCL 4 MG/2ML IJ SOLN: 4.0000 mg | Freq: Four times a day (QID) | INTRAMUSCULAR | Status: DC | PRN

## 2022-01-29 MED ORDER — LABETALOL HCL 5 MG/ML IV SOLN: 10.0000 mg | INTRAVENOUS | Status: DC | PRN

## 2022-01-29 MED ORDER — ONDANSETRON HCL 4 MG/2ML IJ SOLN
INTRAMUSCULAR | Status: AC
Start: 2022-01-29 — End: ?
  Filled 2022-01-29: qty 2

## 2022-01-29 MED ORDER — SODIUM CHLORIDE 0.9% FLUSH: 3.0000 mL | INTRAVENOUS | Status: DC | PRN

## 2022-01-29 MED ORDER — ACETAMINOPHEN 325 MG PO TABS: 650.0000 mg | ORAL_TABLET | ORAL | Status: DC | PRN

## 2022-01-29 MED ORDER — DIPHENHYDRAMINE HCL 50 MG/ML IJ SOLN
25.0000 mg | Freq: Once | INTRAMUSCULAR | Status: AC
  Administered 2022-01-29: 25 mg via INTRAVENOUS

## 2022-01-29 MED ORDER — HEPARIN (PORCINE) IN NACL 1000-0.9 UT/500ML-% IV SOLN
INTRAVENOUS | Status: AC
  Filled 2022-01-29: qty 1000

## 2022-01-29 MED ORDER — ONDANSETRON HCL 4 MG/2ML IJ SOLN
INTRAMUSCULAR | Status: DC | PRN
  Administered 2022-01-29: 4 mg via INTRAVENOUS

## 2022-01-29 MED ORDER — SODIUM CHLORIDE 0.9% FLUSH: 3.0000 mL | Freq: Two times a day (BID) | INTRAVENOUS | Status: DC

## 2022-01-29 MED ORDER — HEPARIN SODIUM (PORCINE) 1000 UNIT/ML IJ SOLN
INTRAMUSCULAR | Status: AC
Start: 2022-01-29 — End: ?
  Filled 2022-01-29: qty 10

## 2022-01-29 MED ORDER — LIDOCAINE HCL (PF) 1 % IJ SOLN
INTRAMUSCULAR | Status: DC | PRN
  Administered 2022-01-29: 12 mL

## 2022-01-29 MED ORDER — LIDOCAINE HCL (PF) 1 % IJ SOLN
INTRAMUSCULAR | Status: AC
  Filled 2022-01-29: qty 30

## 2022-01-29 MED ORDER — SODIUM CHLORIDE 0.9 % IV SOLN: 250.0000 mL | INTRAVENOUS | Status: DC | PRN

## 2022-01-29 MED ORDER — IODIXANOL 320 MG/ML IV SOLN
INTRAVENOUS | Status: DC | PRN
  Administered 2022-01-29: 135 mL via INTRA_ARTERIAL

## 2022-01-29 MED ORDER — FENTANYL CITRATE (PF) 100 MCG/2ML IJ SOLN
INTRAMUSCULAR | Status: AC
  Filled 2022-01-29: qty 2

## 2022-01-29 MED ORDER — FENTANYL CITRATE (PF) 100 MCG/2ML IJ SOLN
INTRAMUSCULAR | Status: DC | PRN
  Administered 2022-01-29: 50 ug via INTRAVENOUS
  Administered 2022-01-29 (×2): 25 ug via INTRAVENOUS

## 2022-01-29 MED ORDER — HEPARIN SODIUM (PORCINE) 1000 UNIT/ML IJ SOLN
INTRAMUSCULAR | Status: AC
  Filled 2022-01-29: qty 10

## 2022-01-29 MED ORDER — MIDAZOLAM HCL 2 MG/2ML IJ SOLN
INTRAMUSCULAR | Status: AC
  Filled 2022-01-29: qty 2

## 2022-01-29 MED ORDER — DIPHENHYDRAMINE HCL 50 MG/ML IJ SOLN
INTRAMUSCULAR | Status: AC
  Filled 2022-01-29: qty 1

## 2022-01-29 MED ORDER — HEPARIN (PORCINE) IN NACL 1000-0.9 UT/500ML-% IV SOLN
INTRAVENOUS | Status: DC | PRN
  Administered 2022-01-29 (×2): 500 mL

## 2022-01-29 SURGICAL SUPPLY — 30 items
BALL STERLING OTW 2.5X100X150 (BALLOONS) ×3
BALLN COYOTE OTW 1.5X40X150 (BALLOONS) ×3
BALLN COYOTE OTW 2.5X220X150 (BALLOONS) ×3
BALLN COYOTE OTW 2X80X150 (BALLOONS) ×6
BALLN STERLING OTW 2.5X100X150 (BALLOONS) ×2
BALLOON COYOTE OTW 1.5X40X150 (BALLOONS) IMPLANT
BALLOON COYOTE OTW 2.5X220X150 (BALLOONS) IMPLANT
BALLOON COYOTE OTW 2X80X150 (BALLOONS) IMPLANT
BALLOON STRLNG OTW 2.5X100X150 (BALLOONS) IMPLANT
CATH CROSS OVER TEMPO 5F (CATHETERS) ×1 IMPLANT
CATH CXI 2.3F 135 ST (CATHETERS) ×1 IMPLANT
CATH CXI SUPP 2.6F 150 ST (CATHETERS) ×1 IMPLANT
CATH CXI SUPP ST 4FR 135CM (CATHETERS) ×1 IMPLANT
CATH OMNI FLUSH 5F 65CM (CATHETERS) ×1 IMPLANT
DEVICE CLOSURE MYNXGRIP 5F (Vascular Products) ×1 IMPLANT
DEVICE TORQUE H2O (MISCELLANEOUS) ×1 IMPLANT
GLIDEWIRE ANGLED NITR .018X260 (WIRE) ×1 IMPLANT
KIT ENCORE 26 ADVANTAGE (KITS) ×1 IMPLANT
KIT MICROPUNCTURE NIT STIFF (SHEATH) ×1 IMPLANT
KIT PV (KITS) ×3 IMPLANT
SHEATH FLEX ANSEL ANG 5F 45CM (SHEATH) ×1 IMPLANT
SHEATH PINNACLE 5F 10CM (SHEATH) ×1 IMPLANT
SYR MEDRAD MARK V 150ML (SYRINGE) ×1 IMPLANT
TRANSDUCER W/STOPCOCK (MISCELLANEOUS) ×3 IMPLANT
TRAY PV CATH (CUSTOM PROCEDURE TRAY) ×3 IMPLANT
WIRE BENTSON .035X145CM (WIRE) ×1 IMPLANT
WIRE COUGAR XT STRL 300CM (WIRE) ×1 IMPLANT
WIRE G V18X300CM (WIRE) ×3 IMPLANT
WIRE ROSEN-J .035X180CM (WIRE) ×1 IMPLANT
WIRE SPARTACORE .014X300CM (WIRE) ×3 IMPLANT

## 2022-01-29 NOTE — Progress Notes (Signed)
Up and walked and tolerated well; left groin with slight swelling noted; however, soft and no pain; Dr Carlis Abbott notified and he had just seen client 92mn ago and ok to d/c home

## 2022-01-29 NOTE — H&P (Signed)
History and Physical Interval Note:  01/29/2022 9:46 AM  Jeremy Dorsey  has presented today for surgery, with the diagnosis of Clinical Limb Ischemia.  The various methods of treatment have been discussed with the patient and family. After consideration of risks, benefits and other options for treatment, the patient has consented to  Procedure(s): ABDOMINAL AORTOGRAM W/ Bilateral LOWER EXTREMITY Runoff (N/A) as a surgical intervention.  The patient's history has been reviewed, patient examined, no change in status, stable for surgery.  I have reviewed the patient's chart and labs.  Questions were answered to the patient's satisfaction.    CLI right lower extremity with rest pain and dependent rubor.  Marty Heck  Patient name: Jeremy Dorsey      MRN: [id number]        DOB: July 27, 1951          Sex: male   REASON FOR VISIT: Evaluate critical limb ischemia right foot   HPI: Jeremy Dorsey is a 71 y.o. male with history of end-stage renal disease on home dialysis Monday/Tuesday/Thursday/Friday, diabetes, hypertension, hyperlipidemia that presents for evaluation of color change and pain in his right foot.  He states his right great toe started changing colors about 3 weeks ago and now he has redness on his first second and third toes that are all painful.  States he cannot sleep.  His wife had to drive home froma recent trip and states he could not drive the car.  He has had no previous lower extremity interventions.  Well-known to our practice with previous dialysis access.  ABI's today are non-compressible with monophasic waveforms right foot and triphasic waveforms left foot.       Past Medical History:  Diagnosis Date   Anemia     Cancer ([place])      basal cell nose and forehead   CKD (chronic kidney disease), stage IV (HCC)     Coronary artery disease      a. s/p IVUS-guided DESx2 to prox & mid LAD, residual disease treated medically. EF 50-55% by recent echo 02/2020.   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   Urinary complication             Past Surgical History:  Procedure Laterality Date   AV FISTULA PLACEMENT Left 10/31/2020    Procedure: LEFT UPPER EXTREMITY ARTERIOVENOUS (AV) FISTULA CREATION;  Surgeon: Waynetta Sandy, MD;  Location: [facility];  Service: Vascular;  Laterality: Left;   AV FISTULA PLACEMENT Left 07/09/2021    Procedure: LEFT ARM ARTERIOVENOUS (AV) FISTULA CREATION;  Surgeon: Serafina Mitchell, MD;  Location: [facility];  Service: Vascular;  Laterality: Left;   COLONOSCOPY   1990   COLONOSCOPY   06/09/2011    Dr Bary Castilla   CORONARY STENT INTERVENTION N/A 04/24/2020    Procedure: CORONARY STENT INTERVENTION;  Surgeon: Wellington Hampshire, MD;  Location: [facility];  Service: Cardiovascular;  Laterality: N/A;   CYSTOSCOPY W/ RETROGRADES Bilateral 08/01/2018    Procedure: CYSTOSCOPY WITH RETROGRADE PYELOGRAM;  Surgeon: Billey Co, MD;  Location: [facility];  Service: Urology;  Laterality: Bilateral;   CYSTOSCOPY WITH BIOPSY N/A 08/01/2018    Procedure: CYSTOSCOPY WITH Bladder BIOPSY;  Surgeon: Nickolas Madrid  C, MD;  Location: [facility];  Service: Urology;  Laterality: N/A;   CYSTOSCOPY WITH STENT PLACEMENT Right 08/01/2018    Procedure: CYSTOSCOPY WITH STENT PLACEMENT;  Surgeon: Billey Co, MD;  Location: [facility];  Service: Urology;  Laterality: Right;   EYE SURGERY Right      laser surgery   INTRAVASCULAR ULTRASOUND/IVUS N/A 04/24/2020    Procedure: Intravascular Ultrasound/IVUS;  Surgeon: Wellington Hampshire, MD;  Location: [facility];  Service: Cardiovascular;  Laterality: N/A;   LEFT HEART CATH AND CORONARY ANGIOGRAPHY N/A 04/24/2020    Procedure: LEFT HEART CATH AND CORONARY ANGIOGRAPHY;  Surgeon: Wellington Hampshire, MD;  Location: [facility];   Service: Cardiovascular;  Laterality: N/A;   SHOULDER ARTHROSCOPY WITH OPEN ROTATOR CUFF REPAIR Right 08/25/2018    Procedure: SHOULDER ARTHROSCOPY WITH MINI OPEN ROTATOR CUFF REPAIR;  Surgeon: Thornton Park, MD;  Location: [facility];  Service: Orthopedics;  Laterality: Right;   TRANSURETHRAL RESECTION OF BLADDER TUMOR N/A 08/01/2018    Procedure: TRANSURETHRAL RESECTION OF BLADDER TUMOR (TURBT);  Surgeon: Billey Co, MD;  Location: [facility];  Service: Urology;  Laterality: N/A;   URETEROSCOPY WITH HOLMIUM LASER LITHOTRIPSY Right 08/01/2018    Procedure: URETEROSCOPY WITH HOLMIUM LASER LITHOTRIPSY;  Surgeon: Billey Co, MD;  Location: [facility];  Service: Urology;  Laterality: Right;           Family History  Problem Relation Age of Onset   Psoriasis Mother     Heart failure Father        SOCIAL HISTORY: Social History         Tobacco Use   Smoking status: Former      Packs/day: 1.00      Years: 30.00      Total pack years: 30.00      Types: Cigarettes      Quit date: 08/23/2007      Years since quitting: 14.4   Smokeless tobacco: Never  Substance Use Topics   Alcohol use: No          Allergies  Allergen Reactions   Oxycodone Nausea And Vomiting   Hydrocodone Nausea And Vomiting            Current Outpatient Medications  Medication Sig Dispense Refill   aspirin 81 MG EC tablet Take 1 tablet (81 mg total) by mouth daily. Swallow whole. 30 tablet 11   atorvastatin (LIPITOR) 80 MG tablet Take 1 tablet (80 mg total) by mouth daily. 90 tablet 3   calcitRIOL (ROCALTROL) 0.25 MCG capsule Take 0.5 mcg by mouth daily.       calcitRIOL (ROCALTROL) 0.25 MCG capsule Take by mouth.       clopidogrel (PLAVIX) 75 MG tablet Take 1 tablet (75 mg total) by mouth daily. 90 tablet 3   metoprolol tartrate (LOPRESSOR) 25 MG tablet Take 1 tablet (25 mg total) by mouth 2 (two) times daily. 180 tablet 3   nitroGLYCERIN (NITROSTAT) 0.4 MG SL tablet Place 1 tablet (0.4 mg total)  under the tongue every 5 (five) minutes x 3 doses as needed for chest pain. 25 tablet 3   sevelamer carbonate (RENVELA) 800 MG tablet Take by mouth.       cephALEXin (KEFLEX) 500 MG capsule Take 1 capsule (500 mg total) by mouth 2 (two) times daily. (Patient not taking: Reported on 01/27/2022) 14 capsule 0   furosemide (LASIX) 40 MG tablet Take 40-80 mg by mouth See admin instructions. Take 80 mg in  the morning and 40 mg at night       sodium bicarbonate 650 MG tablet Take 650 mg by mouth 2 (two) times daily.        torsemide (DEMADEX) 100 MG tablet Take 100 mg by mouth every morning.       traMADol (ULTRAM) 50 MG tablet Take 1 tablet (50 mg total) by mouth every 6 (six) hours as needed. (Patient not taking: Reported on 12/09/2021) 8 tablet 0    No current facility-administered medications for this visit.      REVIEW OF SYSTEMS:  '[X]'$  denotes positive finding, '[ ]'$  denotes negative finding Cardiac   Comments:  Chest pain or chest pressure:      Shortness of breath upon exertion:      Short of breath when lying flat:      Irregular heart rhythm:             Vascular      Pain in calf, thigh, or hip brought on by ambulation:      Pain in feet at night that wakes you up from your sleep:  x Right foot  Blood clot in your veins:      Leg swelling:              Pulmonary      Oxygen at home:      Productive cough:       Wheezing:              Neurologic      Sudden weakness in arms or legs:       Sudden numbness in arms or legs:       Sudden onset of difficulty speaking or slurred speech:      Temporary loss of vision in one eye:       Problems with dizziness:              Gastrointestinal      Blood in stool:       Vomited blood:              Genitourinary      Burning when urinating:       Blood in urine:             Psychiatric      Major depression:              Hematologic      Bleeding problems:      Problems with blood clotting too easily:             Skin      Rashes  or ulcers:             Constitutional      Fever or chills:          PHYSICAL EXAM:    Vitals:    01/27/22 1212  BP: 120/68  Pulse: 94  Resp: 18  Temp: 97.6 F (36.4 C)  TempSrc: Temporal  SpO2: 91%  Weight: 225 lb (102.1 kg)  Height: '5\' 8"'$  (1.727 m)      GENERAL: The patient is a well-nourished male, in no acute distress. The vital signs are documented above. CARDIAC: There is a regular rate and rhythm.  VASCULAR:  Palpable femoral pulses bilaterally Right popliteal palpable No palpable pedal pulses PULMONARY: No respiratory distress. ABDOMEN: Soft and non-tender. MUSCULOSKELETAL: There are no major deformities or cyanosis. NEUROLOGIC: No focal weakness or paresthesias are detected. PSYCHIATRIC: The patient has a normal  affect.        DATA:    ABIs today are noncompressible with monophasic waveforms in the right foot and triphasic waveforms in the left foot   Assessment/Plan:   71 year old male with end-stage renal disease and other comorbidities that presents with evidence of critical limb ischemia with rest pain in the right foot progressing over the last 3 weeks.  He has significant dependent rubor in the first second and third toes as well as pictured above.  I have recommended aortogram, lower extremity arteriogram, and possible intervention from a transfemoral approach with a focus on the right leg.  Discussed if he has no endovascular options may require open surgical bypass.  Discussed this being done at [facility].  Discussed that being done on an outpatient basis.  Risk and benefits discussed including risk of bleeding from transfemoral access.  We will get scheduled for Thursday.  Questions answered.     Marty Heck, MD [facility] Office: [phone]

## 2022-01-29 NOTE — Progress Notes (Signed)
At 1330 groin check, RN noted rash to groin/panus area. Pt states no warmth or itching to area. MD paged, order for 25 mg benadryl IV given.

## 2022-01-29 NOTE — Op Note (Signed)
Patient name: Jeremy Dorsey MRN: [id number] DOB: Feb 19, 1951 Sex: male  01/29/2022 Pre-operative Diagnosis: Critical limb ischemia right lower extremity with rest pain Post-operative diagnosis:  Same Surgeon:  Marty Heck, MD Procedure Performed: 1.  Ultrasound-guided access left common femoral artery 2.  Aortogram with catheter selection of aorta  3.  Right lower extremity arteriogram with selection of third order branches 4.  Right anterior tibial angioplasty (2.5 mm Sterling) 5.  Right DP angioplasty (2.0 mm Sterling) 6.  Right peroneal angioplasty (2.5 mm Sterling) 7.  Mynx closure of the left common femoral artery 8.  127 min of monitored moderate conscious sedation time  Indications: Patient is a 89 male with end-stage renal disease and diabetes that presents with rest pain in the right foot.  He presents for aortogram with lower extremity arteriogram and possible intervention with a focus on the right leg.  Findings:   Aortogram showed no flow-limiting stenosis in the aortoiliac segment.  The renal arteries were not completely evaluated given ESRD.  Right lower extremity arteriogram showed a patent common femoral, profunda, SFA, above and below-knee popliteal artery.  He only has two-vessel runoff in the anterior tibial and peroneal artery that is very calcified and diseased with multilevel high-grade stenosis throughout both vessels.  Significant small vessel disease in the foot with very diseased dorsalis pedis outflow.  Ultimately both the anterior tibial and peroneal were cannulated and angioplasty with 2.5 mm angioplasty balloons.  I had significant difficulty getting down the peroneal antegrade.  The dorsalis pedis was also angioplastied with a 2.0 mm down to the level of the ankle.  I could not get a balloon to advance any further and tried to get a 1.5 mm balloon into the foot.  Now optimized.  Two-vessel runoff now widely patent except for small vessel disease in  the foot.   Procedure:  The patient was identified in the holding area and taken to room 8.  The patient was then placed supine on the table and prepped and draped in the usual sterile fashion.  A time out was called.  Ultrasound was used to evaluate the left common femoral artery.  It was patent .  A digital ultrasound image was acquired.  A micropuncture needle was used to access the left common femoral artery under ultrasound guidance.  An 018 wire was advanced without resistance and a micropuncture sheath was placed.  The 018 wire was removed and a benson wire was placed.  The micropuncture sheath was exchanged for a 5 french sheath.  An omniflush catheter was advanced over the wire to the level of L-1.  An abdominal angiogram was obtained.  Next, using the omniflush catheter and a benson wire, the aortic bifurcation was crossed and the catheter was placed into theright external iliac artery and right runoff was obtained.  Ultimately after evaluating images elected to intervene on the right leg tibial disease.  Rosen wire was used in the right SFA and exchanged for a 5 [place] Ansell sheath in the left groin over the aortic bifurcation.  Patient was given 100 units/kg IV heparin.  Ultimately then used a CXI catheter with a V18 wire to get down initially anterior tibial.  I then downsized to an 014 system with a [place] core and got my wire all the way down into the dorsalis pedis.  The entire anterior tibial was angioplastied with a 2.5 mm Sterling to nominal pressure for 2 minutes all the way down to just before the  ankle.  I then used a 2.0 mm Sterling and angioplastied into the proximal dorsalis pedis.  I try to get a 1.5 mm Sterling but it would not track into the foot.  More patent vessel with significant small vessel disease in the foot.  I then finally was able to get down the peroneal antegrade using a number of wires and catheters.  The peroneal proximal to the mid vessel was angioplastied with a 2.5 mm  Sterling to nominal pressure for 2 minutes.  Wires and catheters were removed.  Mynx closure device was put in the left groin after a short 5 French sheath was placed.   Plan: Patient is optimized from a vascular surgery standpoint.  Continue aspirin Plavix statin.  I will see him in one month with non-invasive imaging.  Marty Heck, MD [facility] Office: [phone]

## 2022-01-29 NOTE — Discharge Instructions (Signed)
Femoral Site Care This sheet gives you information about how to care for yourself after your procedure. Your health care provider may also give you more specific instructions. If you have problems or questions, contact your health care provider. What can I expect after the procedure?  After the procedure, it is common to have: Bruising that usually fades within 1-2 weeks. Tenderness at the site. Follow these instructions at home: Wound care Follow instructions from your health care provider about how to take care of your insertion site. Make sure you: Wash your hands with soap and water before you change your bandage (dressing). If soap and water are not available, use hand sanitizer. Remove your dressing as told by your health care provider. 24 hours Do not take baths, swim, or use a hot tub until your health care provider approves. You may shower 24-48 hours after the procedure or as told by your health care provider. Gently wash the site with plain soap and water. Pat the area dry with a clean towel. Do not rub the site. This may cause bleeding. Do not apply powder or lotion to the site. Keep the site clean and dry. Check your femoral site every day for signs of infection. Check for: Redness, swelling, or pain. Fluid or blood. Warmth. Pus or a bad smell. Activity For the first 2-3 days after your procedure, or as long as directed: Avoid climbing stairs as much as possible. Do not squat. Do not lift anything that is heavier than 10 lb (4.5 kg), or the limit that you are told, until your health care provider says that it is safe. For 5 days Rest as directed. Avoid sitting for a long time without moving. Get up to take short walks every 1-2 hours. Do not drive for 24 hours if you were given a medicine to help you relax (sedative). General instructions Take over-the-counter and prescription medicines only as told by your health care provider. Keep all follow-up visits as told by your  health care provider. This is important. Contact a health care provider if you have: A fever or chills. You have redness, swelling, or pain around your insertion site. Get help right away if: The catheter insertion area swells very fast. You pass out. You suddenly start to sweat or your skin gets clammy. The catheter insertion area is bleeding, and the bleeding does not stop when you hold steady pressure on the area. The area near or just beyond the catheter insertion site becomes pale, cool, tingly, or numb. These symptoms may represent a serious problem that is an emergency. Do not wait to see if the symptoms will go away. Get medical help right away. Call your local emergency services (911 in the [place] Do not drive yourself to the hospital. Summary After the procedure, it is common to have bruising that usually fades within 1-2 weeks. Check your femoral site every day for signs of infection. Do not lift anything that is heavier than 10 lb (4.5 kg), or the limit that you are told, until your health care provider says that it is safe. This information is not intended to replace advice given to you by your health care provider. Make sure you discuss any questions you have with your health care provider. Document Revised: 08/02/2017 Document Reviewed: 08/02/2017 Elsevier Patient Education  2020 [employer]  

## 2022-01-30 ENCOUNTER — Encounter (HOSPITAL_COMMUNITY): Payer: Self-pay | Admitting: Vascular Surgery

## 2022-01-30 DIAGNOSIS — N186 End stage renal disease: Secondary | ICD-10-CM | POA: Diagnosis not present

## 2022-01-30 DIAGNOSIS — Z992 Dependence on renal dialysis: Secondary | ICD-10-CM | POA: Diagnosis not present

## 2022-01-30 DIAGNOSIS — N2581 Secondary hyperparathyroidism of renal origin: Secondary | ICD-10-CM | POA: Diagnosis not present

## 2022-01-30 DIAGNOSIS — I129 Hypertensive chronic kidney disease with stage 1 through stage 4 chronic kidney disease, or unspecified chronic kidney disease: Secondary | ICD-10-CM | POA: Diagnosis not present

## 2022-01-30 MED FILL — Clopidogrel Bisulfate Tab 75 MG (Base Equiv): ORAL | Qty: 1 | Status: AC

## 2022-02-02 ENCOUNTER — Telehealth: Payer: Self-pay

## 2022-02-02 DIAGNOSIS — N186 End stage renal disease: Secondary | ICD-10-CM | POA: Diagnosis not present

## 2022-02-02 DIAGNOSIS — N2581 Secondary hyperparathyroidism of renal origin: Secondary | ICD-10-CM | POA: Diagnosis not present

## 2022-02-02 DIAGNOSIS — Z992 Dependence on renal dialysis: Secondary | ICD-10-CM | POA: Diagnosis not present

## 2022-02-02 NOTE — Telephone Encounter (Signed)
Pt's wife, Jeannene Patella, called stating that Dr Carlis Abbott told them after the procedure, the pt should start showing some improvement within a couple of days, but he's still having pain and discoloration in his toes.  Reviewed pt's chart, returned call, no answer, lf vm

## 2022-02-04 ENCOUNTER — Telehealth: Payer: Self-pay

## 2022-02-04 ENCOUNTER — Telehealth: Payer: Self-pay | Admitting: Surgery

## 2022-02-04 DIAGNOSIS — Z992 Dependence on renal dialysis: Secondary | ICD-10-CM | POA: Diagnosis not present

## 2022-02-04 DIAGNOSIS — N186 End stage renal disease: Secondary | ICD-10-CM | POA: Diagnosis not present

## 2022-02-04 DIAGNOSIS — N2581 Secondary hyperparathyroidism of renal origin: Secondary | ICD-10-CM | POA: Diagnosis not present

## 2022-02-04 NOTE — Telephone Encounter (Signed)
The patient called tonight saying they have left several messages at the office but have not heard back.  He is concerned about the bruising in his groin.  He had a angiogram with Dr. Carlis Abbott on June 29.  He states that the area is not painful but that the discoloration is alarming.  I do not think that this is a pseudoaneurysm however because of his concerns I think we should bring him in to look at this and get a ultrasound.  I will bring him in tomorrow morning  Annamarie Major

## 2022-02-04 NOTE — Telephone Encounter (Signed)
Returned pt's wife call, went directly to VM. Left message to call back if they need further assistance.

## 2022-02-05 ENCOUNTER — Ambulatory Visit (HOSPITAL_COMMUNITY)
Admission: RE | Admit: 2022-02-05 | Discharge: 2022-02-05 | Disposition: A | Discharge status: Home / Self Care | Payer: Medicare HMO | Source: Ambulatory Visit | Attending: Cardiology | Admitting: Cardiology

## 2022-02-05 ENCOUNTER — Telehealth: Payer: Self-pay

## 2022-02-05 ENCOUNTER — Encounter: Payer: Self-pay | Admitting: Vascular Surgery

## 2022-02-05 ENCOUNTER — Ambulatory Visit (INDEPENDENT_AMBULATORY_CARE_PROVIDER_SITE_OTHER): Payer: Medicare HMO | Admitting: Vascular Surgery

## 2022-02-05 VITALS — BP 137/70 | HR 99 | Temp 98.3°F | Resp 20 | Ht 67.0 in | Wt 231.0 lb

## 2022-02-05 DIAGNOSIS — Z992 Dependence on renal dialysis: Secondary | ICD-10-CM | POA: Diagnosis not present

## 2022-02-05 DIAGNOSIS — I729 Aneurysm of unspecified site: Secondary | ICD-10-CM

## 2022-02-05 DIAGNOSIS — N186 End stage renal disease: Secondary | ICD-10-CM | POA: Diagnosis not present

## 2022-02-05 DIAGNOSIS — I70221 Atherosclerosis of native arteries of extremities with rest pain, right leg: Secondary | ICD-10-CM | POA: Diagnosis not present

## 2022-02-05 DIAGNOSIS — N2581 Secondary hyperparathyroidism of renal origin: Secondary | ICD-10-CM | POA: Diagnosis not present

## 2022-02-05 NOTE — Telephone Encounter (Signed)
Called pt directly at home #, two identifiers used. Asked pt his availability for appt and if he had transportation. He stated that he could come at any time today as long as he had at least an hour's head-start for where he lives.  Confirmed appts for [place] at [facility] and appt at this office with a provider.  Called pt again to relay appt information. Pt confirmed understanding.

## 2022-02-05 NOTE — Progress Notes (Signed)
Patient name: Jeremy Dorsey MRN: [id number] DOB: 1951-02-02 Sex: male  REASON FOR VISIT:   Right foot pain  HPI:   Jeremy Dorsey is a pleasant 71 y.o. male who had presented with rest pain of the right foot.  He underwent an arteriogram by Dr. Carlis Abbott on 01/29/2022.  He had angioplasty of the right anterior tibial artery and right peroneal artery.  On the right side, which is the side of concern he had a patent common femoral artery, deep femoral artery, superficial femoral artery, and popliteal artery.  He had two-vessel runoff via the anterior tibial and peroneal arteries which were very calcified and diseased with multilevel high-grade stenoses.  These were addressed with angioplasty.  He did have small vessel disease in the right foot.  On my history, the patient states that he had discoloration of the right great toe with pain in the right foot.  He underwent the procedure as noted above.  He is noted some discoloration in the other toes now and was concerned about this.  The toes are somewhat tender.  He has no other specific complaints.  He is not a smoker.  Current Outpatient Medications  Medication Sig Dispense Refill   aspirin 81 MG EC tablet Take 81 mg by mouth in the morning. Swallow whole. 30 tablet 11   atorvastatin (LIPITOR) 80 MG tablet Take 1 tablet (80 mg total) by mouth daily. 90 tablet 3   calcitRIOL (ROCALTROL) 0.25 MCG capsule Take 0.25 mcg by mouth in the morning.     clopidogrel (PLAVIX) 75 MG tablet Take 1 tablet (75 mg total) by mouth daily. 90 tablet 3   lidocaine-prilocaine (EMLA) cream Apply 1 Application topically daily as needed (prior to port being accessed.).     multivitamin (RENA-VIT) TABS tablet Take 1 tablet by mouth daily.     nitroGLYCERIN (NITROSTAT) 0.4 MG SL tablet Place 1 tablet (0.4 mg total) under the tongue every 5 (five) minutes x 3 doses as needed for chest pain. 25 tablet 3   sevelamer carbonate (RENVELA) 800 MG tablet Take 800 mg by mouth  with breakfast, with lunch, and with evening meal.     torsemide (DEMADEX) 100 MG tablet Take 100 mg by mouth every morning.     cephALEXin (KEFLEX) 500 MG capsule Take 1 capsule (500 mg total) by mouth 2 (two) times daily. (Patient not taking: Reported on 02/05/2022) 14 capsule 0   Current Facility-Administered Medications  Medication Dose Route Frequency Provider Last Rate Last Admin   0.9 %  sodium chloride infusion  250 mL Intravenous PRN Marty Heck, MD       sodium chloride flush (NS) 0.9 % injection 3 mL  3 mL Intravenous Q12H Marty Heck, MD       sodium chloride flush (NS) 0.9 % injection 3 mL  3 mL Intravenous PRN Marty Heck, MD        REVIEW OF SYSTEMS:  '[X]'$  denotes positive finding, '[ ]'$  denotes negative finding Vascular    Leg swelling    Cardiac    Chest pain or chest pressure:    Shortness of breath upon exertion:    Short of breath when lying flat:    Irregular heart rhythm:    Constitutional    Fever or chills:     PHYSICAL EXAM:   Vitals:   02/05/22 1547  BP: 137/70  Pulse: 99  Resp: 20  Temp: 98.3 F (36.8 C)  SpO2: 96%  Weight:  231 lb (104.8 kg)  Height: '5\' 7"'$  (1.702 m)    GENERAL: The patient is a well-nourished male, in no acute distress. The vital signs are documented above. CARDIOVASCULAR: There is a regular rate and rhythm. PULMONARY: There is good air exchange bilaterally without wheezing or rales. VASCULAR: He does have some mild swelling in the left groin. He has a brisk peroneal, anterior tibial, and posterior tibial signal with the Doppler.  He must be getting retrograde flow in the posterior tibial artery. He has some slight discoloration of the toes of the right foot    DATA:   DUPLEX: I did review his duplex scan that was done today of the left groin which shows no evidence of pseudoaneurysm.  There is a small hematoma which measures 10 mm x 8 mm.  MEDICAL ISSUES:   S/P TIBIAL INTERVENTION RIGHT LEG: This  patient has had an excellent result from his tibial intervention on the right leg.  I think the discoloration of the toes is simply related to reperfusion and should gradually improve with time.  Fortunately he is not a smoker.  I encouraged him to stay as active as possible.  He will keep his regularly scheduled follow-up appointment in August.  He will call sooner if he has problems.  Deitra Mayo [facility] [place] [phone]

## 2022-02-06 DIAGNOSIS — N2581 Secondary hyperparathyroidism of renal origin: Secondary | ICD-10-CM | POA: Diagnosis not present

## 2022-02-06 DIAGNOSIS — Z992 Dependence on renal dialysis: Secondary | ICD-10-CM | POA: Diagnosis not present

## 2022-02-06 DIAGNOSIS — N186 End stage renal disease: Secondary | ICD-10-CM | POA: Diagnosis not present

## 2022-02-09 DIAGNOSIS — Z992 Dependence on renal dialysis: Secondary | ICD-10-CM | POA: Diagnosis not present

## 2022-02-09 DIAGNOSIS — N2581 Secondary hyperparathyroidism of renal origin: Secondary | ICD-10-CM | POA: Diagnosis not present

## 2022-02-09 DIAGNOSIS — N186 End stage renal disease: Secondary | ICD-10-CM | POA: Diagnosis not present

## 2022-02-10 ENCOUNTER — Ambulatory Visit: Payer: Medicare HMO | Admitting: Vascular Surgery

## 2022-02-10 ENCOUNTER — Encounter (HOSPITAL_COMMUNITY): Payer: Medicare HMO

## 2022-02-10 DIAGNOSIS — N186 End stage renal disease: Secondary | ICD-10-CM | POA: Diagnosis not present

## 2022-02-10 DIAGNOSIS — N2581 Secondary hyperparathyroidism of renal origin: Secondary | ICD-10-CM | POA: Diagnosis not present

## 2022-02-10 DIAGNOSIS — Z992 Dependence on renal dialysis: Secondary | ICD-10-CM | POA: Diagnosis not present

## 2022-02-12 DIAGNOSIS — N2581 Secondary hyperparathyroidism of renal origin: Secondary | ICD-10-CM | POA: Diagnosis not present

## 2022-02-12 DIAGNOSIS — Z992 Dependence on renal dialysis: Secondary | ICD-10-CM | POA: Diagnosis not present

## 2022-02-12 DIAGNOSIS — N186 End stage renal disease: Secondary | ICD-10-CM | POA: Diagnosis not present

## 2022-02-13 DIAGNOSIS — Z992 Dependence on renal dialysis: Secondary | ICD-10-CM | POA: Diagnosis not present

## 2022-02-13 DIAGNOSIS — N186 End stage renal disease: Secondary | ICD-10-CM | POA: Diagnosis not present

## 2022-02-13 DIAGNOSIS — N2581 Secondary hyperparathyroidism of renal origin: Secondary | ICD-10-CM | POA: Diagnosis not present

## 2022-02-16 DIAGNOSIS — N186 End stage renal disease: Secondary | ICD-10-CM | POA: Diagnosis not present

## 2022-02-16 DIAGNOSIS — N2581 Secondary hyperparathyroidism of renal origin: Secondary | ICD-10-CM | POA: Diagnosis not present

## 2022-02-16 DIAGNOSIS — Z992 Dependence on renal dialysis: Secondary | ICD-10-CM | POA: Diagnosis not present

## 2022-02-18 DIAGNOSIS — N186 End stage renal disease: Secondary | ICD-10-CM | POA: Diagnosis not present

## 2022-02-18 DIAGNOSIS — Z992 Dependence on renal dialysis: Secondary | ICD-10-CM | POA: Diagnosis not present

## 2022-02-18 DIAGNOSIS — N2581 Secondary hyperparathyroidism of renal origin: Secondary | ICD-10-CM | POA: Diagnosis not present

## 2022-02-20 DIAGNOSIS — Z992 Dependence on renal dialysis: Secondary | ICD-10-CM | POA: Diagnosis not present

## 2022-02-20 DIAGNOSIS — N2581 Secondary hyperparathyroidism of renal origin: Secondary | ICD-10-CM | POA: Diagnosis not present

## 2022-02-20 DIAGNOSIS — N186 End stage renal disease: Secondary | ICD-10-CM | POA: Diagnosis not present

## 2022-02-23 DIAGNOSIS — N186 End stage renal disease: Secondary | ICD-10-CM | POA: Diagnosis not present

## 2022-02-23 DIAGNOSIS — N2581 Secondary hyperparathyroidism of renal origin: Secondary | ICD-10-CM | POA: Diagnosis not present

## 2022-02-23 DIAGNOSIS — Z992 Dependence on renal dialysis: Secondary | ICD-10-CM | POA: Diagnosis not present

## 2022-02-25 ENCOUNTER — Other Ambulatory Visit: Payer: Self-pay | Admitting: *Deleted

## 2022-02-25 DIAGNOSIS — N2581 Secondary hyperparathyroidism of renal origin: Secondary | ICD-10-CM | POA: Diagnosis not present

## 2022-02-25 DIAGNOSIS — N186 End stage renal disease: Secondary | ICD-10-CM | POA: Diagnosis not present

## 2022-02-25 DIAGNOSIS — I70221 Atherosclerosis of native arteries of extremities with rest pain, right leg: Secondary | ICD-10-CM

## 2022-02-25 DIAGNOSIS — Z992 Dependence on renal dialysis: Secondary | ICD-10-CM | POA: Diagnosis not present

## 2022-02-27 DIAGNOSIS — N186 End stage renal disease: Secondary | ICD-10-CM | POA: Diagnosis not present

## 2022-02-27 DIAGNOSIS — N2581 Secondary hyperparathyroidism of renal origin: Secondary | ICD-10-CM | POA: Diagnosis not present

## 2022-02-27 DIAGNOSIS — Z992 Dependence on renal dialysis: Secondary | ICD-10-CM | POA: Diagnosis not present

## 2022-03-02 DIAGNOSIS — Z992 Dependence on renal dialysis: Secondary | ICD-10-CM | POA: Diagnosis not present

## 2022-03-02 DIAGNOSIS — I129 Hypertensive chronic kidney disease with stage 1 through stage 4 chronic kidney disease, or unspecified chronic kidney disease: Secondary | ICD-10-CM | POA: Diagnosis not present

## 2022-03-02 DIAGNOSIS — N186 End stage renal disease: Secondary | ICD-10-CM | POA: Diagnosis not present

## 2022-03-02 DIAGNOSIS — N2581 Secondary hyperparathyroidism of renal origin: Secondary | ICD-10-CM | POA: Diagnosis not present

## 2022-03-03 DIAGNOSIS — Z992 Dependence on renal dialysis: Secondary | ICD-10-CM | POA: Diagnosis not present

## 2022-03-03 DIAGNOSIS — N186 End stage renal disease: Secondary | ICD-10-CM | POA: Diagnosis not present

## 2022-03-04 DIAGNOSIS — N2581 Secondary hyperparathyroidism of renal origin: Secondary | ICD-10-CM | POA: Diagnosis not present

## 2022-03-04 DIAGNOSIS — N186 End stage renal disease: Secondary | ICD-10-CM | POA: Diagnosis not present

## 2022-03-04 DIAGNOSIS — Z992 Dependence on renal dialysis: Secondary | ICD-10-CM | POA: Diagnosis not present

## 2022-03-05 DIAGNOSIS — N186 End stage renal disease: Secondary | ICD-10-CM | POA: Diagnosis not present

## 2022-03-05 DIAGNOSIS — Z992 Dependence on renal dialysis: Secondary | ICD-10-CM | POA: Diagnosis not present

## 2022-03-06 DIAGNOSIS — N2581 Secondary hyperparathyroidism of renal origin: Secondary | ICD-10-CM | POA: Diagnosis not present

## 2022-03-06 DIAGNOSIS — N186 End stage renal disease: Secondary | ICD-10-CM | POA: Diagnosis not present

## 2022-03-06 DIAGNOSIS — Z992 Dependence on renal dialysis: Secondary | ICD-10-CM | POA: Diagnosis not present

## 2022-03-09 DIAGNOSIS — N186 End stage renal disease: Secondary | ICD-10-CM | POA: Diagnosis not present

## 2022-03-09 DIAGNOSIS — Z992 Dependence on renal dialysis: Secondary | ICD-10-CM | POA: Diagnosis not present

## 2022-03-09 DIAGNOSIS — N2581 Secondary hyperparathyroidism of renal origin: Secondary | ICD-10-CM | POA: Diagnosis not present

## 2022-03-11 DIAGNOSIS — Z992 Dependence on renal dialysis: Secondary | ICD-10-CM | POA: Diagnosis not present

## 2022-03-11 DIAGNOSIS — N2581 Secondary hyperparathyroidism of renal origin: Secondary | ICD-10-CM | POA: Diagnosis not present

## 2022-03-11 DIAGNOSIS — N186 End stage renal disease: Secondary | ICD-10-CM | POA: Diagnosis not present

## 2022-03-13 DIAGNOSIS — N186 End stage renal disease: Secondary | ICD-10-CM | POA: Diagnosis not present

## 2022-03-13 DIAGNOSIS — N2581 Secondary hyperparathyroidism of renal origin: Secondary | ICD-10-CM | POA: Diagnosis not present

## 2022-03-13 DIAGNOSIS — Z992 Dependence on renal dialysis: Secondary | ICD-10-CM | POA: Diagnosis not present

## 2022-03-16 DIAGNOSIS — N186 End stage renal disease: Secondary | ICD-10-CM | POA: Diagnosis not present

## 2022-03-16 DIAGNOSIS — Z992 Dependence on renal dialysis: Secondary | ICD-10-CM | POA: Diagnosis not present

## 2022-03-16 DIAGNOSIS — N2581 Secondary hyperparathyroidism of renal origin: Secondary | ICD-10-CM | POA: Diagnosis not present

## 2022-03-17 ENCOUNTER — Ambulatory Visit (HOSPITAL_COMMUNITY)
Admission: RE | Admit: 2022-03-17 | Discharge: 2022-03-17 | Disposition: A | Discharge status: Home / Self Care | Payer: Medicare HMO | Source: Ambulatory Visit | Attending: Surgery | Admitting: Surgery

## 2022-03-17 ENCOUNTER — Ambulatory Visit (INDEPENDENT_AMBULATORY_CARE_PROVIDER_SITE_OTHER)
Admission: RE | Admit: 2022-03-17 | Discharge: 2022-03-17 | Disposition: A | Discharge status: Home / Self Care | Payer: Medicare HMO | Source: Ambulatory Visit | Attending: Vascular Surgery | Admitting: Vascular Surgery

## 2022-03-17 ENCOUNTER — Ambulatory Visit (INDEPENDENT_AMBULATORY_CARE_PROVIDER_SITE_OTHER): Payer: Medicare HMO | Admitting: Vascular Surgery

## 2022-03-17 ENCOUNTER — Encounter: Payer: Self-pay | Admitting: Vascular Surgery

## 2022-03-17 VITALS — BP 138/81 | HR 81 | Temp 98.1°F | Resp 16 | Ht 70.0 in | Wt 226.0 lb

## 2022-03-17 DIAGNOSIS — I70221 Atherosclerosis of native arteries of extremities with rest pain, right leg: Secondary | ICD-10-CM | POA: Diagnosis not present

## 2022-03-17 NOTE — Progress Notes (Signed)
Patient name: Jeremy Dorsey MRN: [id number] DOB: 01-26-1951 Sex: male  REASON FOR VISIT: Follow-up after right leg intervention for CLI with rest pain  HPI: Jeremy Dorsey is a 71 y.o. male with ESRD and DM that presents for follow-up after recent right leg intervention for CLI with rest pain.  On 01/29/2022 he underwent a right anterior tibial, dorsalis pedis and peroneal angioplasty for tibial disease in the setting of rest pain with dependent rubor in the foot.  All of his rest pain is resolved.  All the dependent rubor is better.  He did avulse the toenail last night in the right great toe.  Past Medical History:  Diagnosis Date   Anemia    Cancer ([place])    basal cell nose and forehead   CKD (chronic kidney disease), stage IV (HCC)    Coronary artery disease    a. s/p IVUS-guided DESx2 to prox & mid LAD, residual disease treated medically. EF 50-55% by recent echo 02/2020.   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   Urinary complication     Past Surgical History:  Procedure Laterality Date   ABDOMINAL AORTOGRAM W/LOWER EXTREMITY N/A 01/29/2022   Procedure: ABDOMINAL AORTOGRAM W/ Bilateral LOWER EXTREMITY Runoff;  Surgeon: Marty Heck, MD;  Location: [place] [facility];  Service: Cardiovascular;  Laterality: N/A;   AV FISTULA PLACEMENT Left 10/31/2020   Procedure: LEFT UPPER EXTREMITY ARTERIOVENOUS (AV) FISTULA CREATION;  Surgeon: Waynetta Sandy, MD;  Location: [facility];  Service: Vascular;  Laterality: Left;   AV FISTULA PLACEMENT Left 07/09/2021   Procedure: LEFT ARM ARTERIOVENOUS (AV) FISTULA CREATION;  Surgeon: Serafina Mitchell, MD;  Location: [place];  Service: Vascular;  Laterality: Left;   COLONOSCOPY  1990   COLONOSCOPY  06/09/2011   Dr Bary Castilla   CORONARY STENT INTERVENTION N/A 04/24/2020    Procedure: CORONARY STENT INTERVENTION;  Surgeon: Wellington Hampshire, MD;  Location: [facility] [facility];  Service: Cardiovascular;  Laterality: N/A;   CYSTOSCOPY W/ RETROGRADES Bilateral 08/01/2018   Procedure: CYSTOSCOPY WITH RETROGRADE PYELOGRAM;  Surgeon: Billey Co, MD;  Location: [facility];  Service: Urology;  Laterality: Bilateral;   CYSTOSCOPY WITH BIOPSY N/A 08/01/2018   Procedure: CYSTOSCOPY WITH Bladder BIOPSY;  Surgeon: Billey Co, MD;  Location: [facility];  Service: Urology;  Laterality: N/A;   CYSTOSCOPY WITH STENT PLACEMENT Right 08/01/2018   Procedure: CYSTOSCOPY WITH STENT PLACEMENT;  Surgeon: Billey Co, MD;  Location: [facility];  Service: Urology;  Laterality: Right;   EYE SURGERY Right    laser surgery   INTRAVASCULAR ULTRASOUND/IVUS N/A 04/24/2020   Procedure: Intravascular Ultrasound/IVUS;  Surgeon: Wellington Hampshire, MD;  Location: [place];  Service: Cardiovascular;  Laterality: N/A;   LEFT HEART CATH AND CORONARY ANGIOGRAPHY N/A 04/24/2020   Procedure: LEFT HEART CATH AND CORONARY ANGIOGRAPHY;  Surgeon: Wellington Hampshire, MD;  Location: [place] [facility];  Service: Cardiovascular;  Laterality: N/A;   PERIPHERAL VASCULAR BALLOON ANGIOPLASTY Right 01/29/2022   Procedure: PERIPHERAL VASCULAR BALLOON ANGIOPLASTY;  Surgeon: Marty Heck, MD;  Location: [place] [facility];  Service: Cardiovascular;  Laterality: Right;   SHOULDER ARTHROSCOPY WITH OPEN ROTATOR CUFF REPAIR Right 08/25/2018   Procedure: SHOULDER ARTHROSCOPY WITH MINI OPEN ROTATOR  CUFF REPAIR;  Surgeon: Thornton Park, MD;  Location: [facility];  Service: Orthopedics;  Laterality: Right;   TRANSURETHRAL RESECTION OF BLADDER TUMOR N/A 08/01/2018   Procedure: TRANSURETHRAL RESECTION OF BLADDER TUMOR (TURBT);  Surgeon: Billey Co, MD;  Location: [facility];  Service: Urology;  Laterality: N/A;   URETEROSCOPY WITH HOLMIUM LASER LITHOTRIPSY Right 08/01/2018   Procedure: URETEROSCOPY WITH  HOLMIUM LASER LITHOTRIPSY;  Surgeon: Billey Co, MD;  Location: [facility];  Service: Urology;  Laterality: Right;    Family History  Problem Relation Age of Onset   Psoriasis Mother    Heart failure Father     SOCIAL HISTORY: Social History   Tobacco Use   Smoking status: Former    Packs/day: 1.00    Years: 30.00    Total pack years: 30.00    Types: Cigarettes    Quit date: 08/23/2007    Years since quitting: 14.5   Smokeless tobacco: Never  Substance Use Topics   Alcohol use: No    Allergies  Allergen Reactions   Oxycodone Nausea And Vomiting   Hydrocodone Nausea And Vomiting    Current Outpatient Medications  Medication Sig Dispense Refill   aspirin 81 MG EC tablet Take 81 mg by mouth in the morning. Swallow whole. 30 tablet 11   atorvastatin (LIPITOR) 80 MG tablet Take 1 tablet (80 mg total) by mouth daily. 90 tablet 3   calcitRIOL (ROCALTROL) 0.25 MCG capsule Take 0.25 mcg by mouth in the morning.     clopidogrel (PLAVIX) 75 MG tablet Take 1 tablet (75 mg total) by mouth daily. 90 tablet 3   lidocaine-prilocaine (EMLA) cream Apply 1 Application topically daily as needed (prior to port being accessed.).     multivitamin (RENA-VIT) TABS tablet Take 1 tablet by mouth daily.     nitroGLYCERIN (NITROSTAT) 0.4 MG SL tablet Place 1 tablet (0.4 mg total) under the tongue every 5 (five) minutes x 3 doses as needed for chest pain. 25 tablet 3   sevelamer carbonate (RENVELA) 800 MG tablet Take 800 mg by mouth with breakfast, with lunch, and with evening meal.     torsemide (DEMADEX) 100 MG tablet Take 100 mg by mouth every morning.     cephALEXin (KEFLEX) 500 MG capsule Take 1 capsule (500 mg total) by mouth 2 (two) times daily. (Patient not taking: Reported on 02/05/2022) 14 capsule 0   Current Facility-Administered Medications  Medication Dose Route Frequency Provider Last Rate Last Admin   0.9 %  sodium chloride infusion  250 mL Intravenous PRN Marty Heck, MD        sodium chloride flush (NS) 0.9 % injection 3 mL  3 mL Intravenous Q12H Marty Heck, MD       sodium chloride flush (NS) 0.9 % injection 3 mL  3 mL Intravenous PRN Marty Heck, MD        REVIEW OF SYSTEMS:  '[X]'$  denotes positive finding, '[ ]'$  denotes negative finding Cardiac  Comments:  Chest pain or chest pressure:    Shortness of breath upon exertion:    Short of breath when lying flat:    Irregular heart rhythm:        Vascular    Pain in calf, thigh, or hip brought on by ambulation:    Pain in feet at night that wakes you up from your sleep:     Blood clot in your veins:    Leg swelling:  Pulmonary    Oxygen at home:    Productive cough:     Wheezing:         Neurologic    Sudden weakness in arms or legs:     Sudden numbness in arms or legs:     Sudden onset of difficulty speaking or slurred speech:    Temporary loss of vision in one eye:     Problems with dizziness:         Gastrointestinal    Blood in stool:     Vomited blood:         Genitourinary    Burning when urinating:     Blood in urine:        Psychiatric    Major depression:         Hematologic    Bleeding problems:    Problems with blood clotting too easily:        Skin    Rashes or ulcers:        Constitutional    Fever or chills:      PHYSICAL EXAM: Vitals:   03/17/22 1501  BP: 138/81  Pulse: 81  Resp: 16  Temp: 98.1 F (36.7 C)  TempSrc: Temporal  SpO2: 97%  Weight: 226 lb (102.5 kg)  Height: '5\' 10"'$  (1.778 m)    GENERAL: The patient is a well-nourished male, in no acute distress. The vital signs are documented above. CARDIAC: There is a regular rate and rhythm.  VASCULAR:  Brisk biphasic right anterior tibial, dorsalis pedis and peroneal signal Toenail avulsion right great toenail PULMONARY: No respiratory distress. ABDOMEN: Soft and non-tender.   DATA:   ABI's noncompressible  Right leg arterial duplex shows triphasic flow through the popliteal  artery and then flow in the anterior tibial and peroneal  Assessment/Plan:  71 y.o. male with ESRD and DM that presents for follow-up after recent right leg intervention for CLI with rest pain.  On 01/29/2022 he underwent a right anterior tibial, dorsalis pedis and peroneal angioplasty for tibial disease in the setting of rest pain with dependent rubor in the foot.   He had an excellent result and has brisk dorsalis pedis and peroneal signal on the right foot.  Discussed he now has a pulsatile toe tracing on the right and is optimized from my standpoint.  His foot looks much better and all dependent rubor and color change resolved and foot warm. I told him to let me know if his toenail avulsion does not heal but I will see him in 6 months with noninvasive imaging.  Discussed that he stay on aspirin Plavix from my standpoint.    Marty Heck, MD [facility] Office: [phone]

## 2022-03-18 DIAGNOSIS — N186 End stage renal disease: Secondary | ICD-10-CM | POA: Diagnosis not present

## 2022-03-18 DIAGNOSIS — Z992 Dependence on renal dialysis: Secondary | ICD-10-CM | POA: Diagnosis not present

## 2022-03-18 DIAGNOSIS — N2581 Secondary hyperparathyroidism of renal origin: Secondary | ICD-10-CM | POA: Diagnosis not present

## 2022-03-20 ENCOUNTER — Other Ambulatory Visit: Payer: Self-pay

## 2022-03-20 DIAGNOSIS — Z992 Dependence on renal dialysis: Secondary | ICD-10-CM | POA: Diagnosis not present

## 2022-03-20 DIAGNOSIS — I739 Peripheral vascular disease, unspecified: Secondary | ICD-10-CM

## 2022-03-20 DIAGNOSIS — N2581 Secondary hyperparathyroidism of renal origin: Secondary | ICD-10-CM | POA: Diagnosis not present

## 2022-03-20 DIAGNOSIS — I70221 Atherosclerosis of native arteries of extremities with rest pain, right leg: Secondary | ICD-10-CM

## 2022-03-20 DIAGNOSIS — N186 End stage renal disease: Secondary | ICD-10-CM | POA: Diagnosis not present

## 2022-03-23 DIAGNOSIS — N186 End stage renal disease: Secondary | ICD-10-CM | POA: Diagnosis not present

## 2022-03-23 DIAGNOSIS — Z992 Dependence on renal dialysis: Secondary | ICD-10-CM | POA: Diagnosis not present

## 2022-03-23 DIAGNOSIS — N2581 Secondary hyperparathyroidism of renal origin: Secondary | ICD-10-CM | POA: Diagnosis not present

## 2022-03-25 DIAGNOSIS — N186 End stage renal disease: Secondary | ICD-10-CM | POA: Diagnosis not present

## 2022-03-25 DIAGNOSIS — Z992 Dependence on renal dialysis: Secondary | ICD-10-CM | POA: Diagnosis not present

## 2022-03-25 DIAGNOSIS — N2581 Secondary hyperparathyroidism of renal origin: Secondary | ICD-10-CM | POA: Diagnosis not present

## 2022-03-27 DIAGNOSIS — N186 End stage renal disease: Secondary | ICD-10-CM | POA: Diagnosis not present

## 2022-03-27 DIAGNOSIS — N2581 Secondary hyperparathyroidism of renal origin: Secondary | ICD-10-CM | POA: Diagnosis not present

## 2022-03-27 DIAGNOSIS — Z992 Dependence on renal dialysis: Secondary | ICD-10-CM | POA: Diagnosis not present

## 2022-03-30 DIAGNOSIS — Z992 Dependence on renal dialysis: Secondary | ICD-10-CM | POA: Diagnosis not present

## 2022-03-30 DIAGNOSIS — N2581 Secondary hyperparathyroidism of renal origin: Secondary | ICD-10-CM | POA: Diagnosis not present

## 2022-03-30 DIAGNOSIS — N186 End stage renal disease: Secondary | ICD-10-CM | POA: Diagnosis not present

## 2022-04-01 DIAGNOSIS — N186 End stage renal disease: Secondary | ICD-10-CM | POA: Diagnosis not present

## 2022-04-01 DIAGNOSIS — Z992 Dependence on renal dialysis: Secondary | ICD-10-CM | POA: Diagnosis not present

## 2022-04-01 DIAGNOSIS — N2581 Secondary hyperparathyroidism of renal origin: Secondary | ICD-10-CM | POA: Diagnosis not present

## 2022-04-02 DIAGNOSIS — N186 End stage renal disease: Secondary | ICD-10-CM | POA: Diagnosis not present

## 2022-04-02 DIAGNOSIS — Z992 Dependence on renal dialysis: Secondary | ICD-10-CM | POA: Diagnosis not present

## 2022-04-02 DIAGNOSIS — I129 Hypertensive chronic kidney disease with stage 1 through stage 4 chronic kidney disease, or unspecified chronic kidney disease: Secondary | ICD-10-CM | POA: Diagnosis not present

## 2022-04-03 DIAGNOSIS — N2581 Secondary hyperparathyroidism of renal origin: Secondary | ICD-10-CM | POA: Diagnosis not present

## 2022-04-03 DIAGNOSIS — N186 End stage renal disease: Secondary | ICD-10-CM | POA: Diagnosis not present

## 2022-04-03 DIAGNOSIS — Z992 Dependence on renal dialysis: Secondary | ICD-10-CM | POA: Diagnosis not present

## 2022-04-06 DIAGNOSIS — N2581 Secondary hyperparathyroidism of renal origin: Secondary | ICD-10-CM | POA: Diagnosis not present

## 2022-04-06 DIAGNOSIS — Z992 Dependence on renal dialysis: Secondary | ICD-10-CM | POA: Diagnosis not present

## 2022-04-06 DIAGNOSIS — N186 End stage renal disease: Secondary | ICD-10-CM | POA: Diagnosis not present

## 2022-04-08 DIAGNOSIS — Z992 Dependence on renal dialysis: Secondary | ICD-10-CM | POA: Diagnosis not present

## 2022-04-08 DIAGNOSIS — N2581 Secondary hyperparathyroidism of renal origin: Secondary | ICD-10-CM | POA: Diagnosis not present

## 2022-04-08 DIAGNOSIS — N186 End stage renal disease: Secondary | ICD-10-CM | POA: Diagnosis not present

## 2022-04-10 DIAGNOSIS — N186 End stage renal disease: Secondary | ICD-10-CM | POA: Diagnosis not present

## 2022-04-10 DIAGNOSIS — Z992 Dependence on renal dialysis: Secondary | ICD-10-CM | POA: Diagnosis not present

## 2022-04-10 DIAGNOSIS — N2581 Secondary hyperparathyroidism of renal origin: Secondary | ICD-10-CM | POA: Diagnosis not present

## 2022-04-13 DIAGNOSIS — N186 End stage renal disease: Secondary | ICD-10-CM | POA: Diagnosis not present

## 2022-04-13 DIAGNOSIS — Z992 Dependence on renal dialysis: Secondary | ICD-10-CM | POA: Diagnosis not present

## 2022-04-13 DIAGNOSIS — N2581 Secondary hyperparathyroidism of renal origin: Secondary | ICD-10-CM | POA: Diagnosis not present

## 2022-04-15 DIAGNOSIS — N186 End stage renal disease: Secondary | ICD-10-CM | POA: Diagnosis not present

## 2022-04-15 DIAGNOSIS — N2581 Secondary hyperparathyroidism of renal origin: Secondary | ICD-10-CM | POA: Diagnosis not present

## 2022-04-15 DIAGNOSIS — Z992 Dependence on renal dialysis: Secondary | ICD-10-CM | POA: Diagnosis not present

## 2022-04-17 DIAGNOSIS — N186 End stage renal disease: Secondary | ICD-10-CM | POA: Diagnosis not present

## 2022-04-17 DIAGNOSIS — N2581 Secondary hyperparathyroidism of renal origin: Secondary | ICD-10-CM | POA: Diagnosis not present

## 2022-04-17 DIAGNOSIS — Z992 Dependence on renal dialysis: Secondary | ICD-10-CM | POA: Diagnosis not present

## 2022-04-20 DIAGNOSIS — N2581 Secondary hyperparathyroidism of renal origin: Secondary | ICD-10-CM | POA: Diagnosis not present

## 2022-04-20 DIAGNOSIS — Z992 Dependence on renal dialysis: Secondary | ICD-10-CM | POA: Diagnosis not present

## 2022-04-20 DIAGNOSIS — N186 End stage renal disease: Secondary | ICD-10-CM | POA: Diagnosis not present

## 2022-04-22 DIAGNOSIS — N186 End stage renal disease: Secondary | ICD-10-CM | POA: Diagnosis not present

## 2022-04-22 DIAGNOSIS — Z992 Dependence on renal dialysis: Secondary | ICD-10-CM | POA: Diagnosis not present

## 2022-04-22 DIAGNOSIS — N2581 Secondary hyperparathyroidism of renal origin: Secondary | ICD-10-CM | POA: Diagnosis not present

## 2022-04-24 DIAGNOSIS — N186 End stage renal disease: Secondary | ICD-10-CM | POA: Diagnosis not present

## 2022-04-24 DIAGNOSIS — N2581 Secondary hyperparathyroidism of renal origin: Secondary | ICD-10-CM | POA: Diagnosis not present

## 2022-04-24 DIAGNOSIS — Z992 Dependence on renal dialysis: Secondary | ICD-10-CM | POA: Diagnosis not present

## 2022-04-27 DIAGNOSIS — N186 End stage renal disease: Secondary | ICD-10-CM | POA: Diagnosis not present

## 2022-04-27 DIAGNOSIS — N2581 Secondary hyperparathyroidism of renal origin: Secondary | ICD-10-CM | POA: Diagnosis not present

## 2022-04-27 DIAGNOSIS — Z992 Dependence on renal dialysis: Secondary | ICD-10-CM | POA: Diagnosis not present

## 2022-04-29 DIAGNOSIS — Z992 Dependence on renal dialysis: Secondary | ICD-10-CM | POA: Diagnosis not present

## 2022-04-29 DIAGNOSIS — N2581 Secondary hyperparathyroidism of renal origin: Secondary | ICD-10-CM | POA: Diagnosis not present

## 2022-04-29 DIAGNOSIS — N186 End stage renal disease: Secondary | ICD-10-CM | POA: Diagnosis not present

## 2022-05-01 DIAGNOSIS — N186 End stage renal disease: Secondary | ICD-10-CM | POA: Diagnosis not present

## 2022-05-01 DIAGNOSIS — Z992 Dependence on renal dialysis: Secondary | ICD-10-CM | POA: Diagnosis not present

## 2022-05-01 DIAGNOSIS — N2581 Secondary hyperparathyroidism of renal origin: Secondary | ICD-10-CM | POA: Diagnosis not present

## 2022-05-02 DIAGNOSIS — N186 End stage renal disease: Secondary | ICD-10-CM | POA: Diagnosis not present

## 2022-05-02 DIAGNOSIS — Z992 Dependence on renal dialysis: Secondary | ICD-10-CM | POA: Diagnosis not present

## 2022-05-02 DIAGNOSIS — I129 Hypertensive chronic kidney disease with stage 1 through stage 4 chronic kidney disease, or unspecified chronic kidney disease: Secondary | ICD-10-CM | POA: Diagnosis not present

## 2022-05-04 DIAGNOSIS — N186 End stage renal disease: Secondary | ICD-10-CM | POA: Diagnosis not present

## 2022-05-04 DIAGNOSIS — N2581 Secondary hyperparathyroidism of renal origin: Secondary | ICD-10-CM | POA: Diagnosis not present

## 2022-05-04 DIAGNOSIS — Z992 Dependence on renal dialysis: Secondary | ICD-10-CM | POA: Diagnosis not present

## 2022-05-06 DIAGNOSIS — L570 Actinic keratosis: Secondary | ICD-10-CM | POA: Diagnosis not present

## 2022-05-06 DIAGNOSIS — B360 Pityriasis versicolor: Secondary | ICD-10-CM | POA: Diagnosis not present

## 2022-05-06 DIAGNOSIS — L239 Allergic contact dermatitis, unspecified cause: Secondary | ICD-10-CM | POA: Diagnosis not present

## 2022-05-06 DIAGNOSIS — L218 Other seborrheic dermatitis: Secondary | ICD-10-CM | POA: Diagnosis not present

## 2022-05-06 DIAGNOSIS — Z85828 Personal history of other malignant neoplasm of skin: Secondary | ICD-10-CM | POA: Diagnosis not present

## 2022-05-06 DIAGNOSIS — L820 Inflamed seborrheic keratosis: Secondary | ICD-10-CM | POA: Diagnosis not present

## 2022-05-06 DIAGNOSIS — X32XXXA Exposure to sunlight, initial encounter: Secondary | ICD-10-CM | POA: Diagnosis not present

## 2022-05-06 DIAGNOSIS — L538 Other specified erythematous conditions: Secondary | ICD-10-CM | POA: Diagnosis not present

## 2022-05-06 DIAGNOSIS — B354 Tinea corporis: Secondary | ICD-10-CM | POA: Diagnosis not present

## 2022-05-07 DIAGNOSIS — N186 End stage renal disease: Secondary | ICD-10-CM | POA: Diagnosis not present

## 2022-05-07 DIAGNOSIS — N2581 Secondary hyperparathyroidism of renal origin: Secondary | ICD-10-CM | POA: Diagnosis not present

## 2022-05-07 DIAGNOSIS — Z992 Dependence on renal dialysis: Secondary | ICD-10-CM | POA: Diagnosis not present

## 2022-05-09 DIAGNOSIS — Z992 Dependence on renal dialysis: Secondary | ICD-10-CM | POA: Diagnosis not present

## 2022-05-09 DIAGNOSIS — N2581 Secondary hyperparathyroidism of renal origin: Secondary | ICD-10-CM | POA: Diagnosis not present

## 2022-05-09 DIAGNOSIS — N186 End stage renal disease: Secondary | ICD-10-CM | POA: Diagnosis not present

## 2022-05-11 DIAGNOSIS — Z992 Dependence on renal dialysis: Secondary | ICD-10-CM | POA: Diagnosis not present

## 2022-05-11 DIAGNOSIS — N186 End stage renal disease: Secondary | ICD-10-CM | POA: Diagnosis not present

## 2022-05-11 DIAGNOSIS — N2581 Secondary hyperparathyroidism of renal origin: Secondary | ICD-10-CM | POA: Diagnosis not present

## 2022-05-13 DIAGNOSIS — N2581 Secondary hyperparathyroidism of renal origin: Secondary | ICD-10-CM | POA: Diagnosis not present

## 2022-05-13 DIAGNOSIS — N186 End stage renal disease: Secondary | ICD-10-CM | POA: Diagnosis not present

## 2022-05-13 DIAGNOSIS — Z992 Dependence on renal dialysis: Secondary | ICD-10-CM | POA: Diagnosis not present

## 2022-05-15 DIAGNOSIS — Z992 Dependence on renal dialysis: Secondary | ICD-10-CM | POA: Diagnosis not present

## 2022-05-15 DIAGNOSIS — N186 End stage renal disease: Secondary | ICD-10-CM | POA: Diagnosis not present

## 2022-05-15 DIAGNOSIS — N2581 Secondary hyperparathyroidism of renal origin: Secondary | ICD-10-CM | POA: Diagnosis not present

## 2022-05-18 DIAGNOSIS — N2581 Secondary hyperparathyroidism of renal origin: Secondary | ICD-10-CM | POA: Diagnosis not present

## 2022-05-18 DIAGNOSIS — Z992 Dependence on renal dialysis: Secondary | ICD-10-CM | POA: Diagnosis not present

## 2022-05-18 DIAGNOSIS — N186 End stage renal disease: Secondary | ICD-10-CM | POA: Diagnosis not present

## 2022-05-20 DIAGNOSIS — N186 End stage renal disease: Secondary | ICD-10-CM | POA: Diagnosis not present

## 2022-05-20 DIAGNOSIS — N2581 Secondary hyperparathyroidism of renal origin: Secondary | ICD-10-CM | POA: Diagnosis not present

## 2022-05-20 DIAGNOSIS — Z992 Dependence on renal dialysis: Secondary | ICD-10-CM | POA: Diagnosis not present

## 2022-05-22 DIAGNOSIS — N2581 Secondary hyperparathyroidism of renal origin: Secondary | ICD-10-CM | POA: Diagnosis not present

## 2022-05-22 DIAGNOSIS — N186 End stage renal disease: Secondary | ICD-10-CM | POA: Diagnosis not present

## 2022-05-22 DIAGNOSIS — Z992 Dependence on renal dialysis: Secondary | ICD-10-CM | POA: Diagnosis not present

## 2022-05-25 DIAGNOSIS — Z992 Dependence on renal dialysis: Secondary | ICD-10-CM | POA: Diagnosis not present

## 2022-05-25 DIAGNOSIS — N2581 Secondary hyperparathyroidism of renal origin: Secondary | ICD-10-CM | POA: Diagnosis not present

## 2022-05-25 DIAGNOSIS — N186 End stage renal disease: Secondary | ICD-10-CM | POA: Diagnosis not present

## 2022-05-27 ENCOUNTER — Telehealth: Payer: Self-pay

## 2022-05-27 DIAGNOSIS — N186 End stage renal disease: Secondary | ICD-10-CM | POA: Diagnosis not present

## 2022-05-27 DIAGNOSIS — Z992 Dependence on renal dialysis: Secondary | ICD-10-CM | POA: Diagnosis not present

## 2022-05-27 DIAGNOSIS — N2581 Secondary hyperparathyroidism of renal origin: Secondary | ICD-10-CM | POA: Diagnosis not present

## 2022-05-27 NOTE — Telephone Encounter (Signed)
Pt called stating that his R foot is cold and the 2nd toe is red and wanted an appt. Pt gave two different phone numbers to call.  Reviewed pt's chart, returned call for clarification, no answer, lf vm. Called other number, no answer, lf vm.

## 2022-05-29 DIAGNOSIS — Z992 Dependence on renal dialysis: Secondary | ICD-10-CM | POA: Diagnosis not present

## 2022-05-29 DIAGNOSIS — N186 End stage renal disease: Secondary | ICD-10-CM | POA: Diagnosis not present

## 2022-05-29 DIAGNOSIS — N2581 Secondary hyperparathyroidism of renal origin: Secondary | ICD-10-CM | POA: Diagnosis not present

## 2022-06-01 DIAGNOSIS — N186 End stage renal disease: Secondary | ICD-10-CM | POA: Diagnosis not present

## 2022-06-01 DIAGNOSIS — N2581 Secondary hyperparathyroidism of renal origin: Secondary | ICD-10-CM | POA: Diagnosis not present

## 2022-06-01 DIAGNOSIS — Z992 Dependence on renal dialysis: Secondary | ICD-10-CM | POA: Diagnosis not present

## 2022-06-02 ENCOUNTER — Ambulatory Visit (HOSPITAL_COMMUNITY)
Admission: RE | Admit: 2022-06-02 | Discharge: 2022-06-02 | Disposition: A | Discharge status: Home / Self Care | Payer: Medicare HMO | Source: Ambulatory Visit | Attending: Vascular Surgery | Admitting: Vascular Surgery

## 2022-06-02 ENCOUNTER — Encounter: Payer: Self-pay | Admitting: Vascular Surgery

## 2022-06-02 ENCOUNTER — Other Ambulatory Visit: Payer: Self-pay

## 2022-06-02 ENCOUNTER — Ambulatory Visit (INDEPENDENT_AMBULATORY_CARE_PROVIDER_SITE_OTHER): Payer: Medicare HMO | Admitting: Vascular Surgery

## 2022-06-02 VITALS — BP 155/79 | HR 69 | Temp 97.6°F | Resp 16 | Ht 70.0 in | Wt 230.0 lb

## 2022-06-02 DIAGNOSIS — I70221 Atherosclerosis of native arteries of extremities with rest pain, right leg: Secondary | ICD-10-CM

## 2022-06-02 DIAGNOSIS — I70223 Atherosclerosis of native arteries of extremities with rest pain, bilateral legs: Secondary | ICD-10-CM

## 2022-06-02 DIAGNOSIS — N186 End stage renal disease: Secondary | ICD-10-CM | POA: Diagnosis not present

## 2022-06-02 DIAGNOSIS — I739 Peripheral vascular disease, unspecified: Secondary | ICD-10-CM | POA: Diagnosis not present

## 2022-06-02 DIAGNOSIS — I129 Hypertensive chronic kidney disease with stage 1 through stage 4 chronic kidney disease, or unspecified chronic kidney disease: Secondary | ICD-10-CM | POA: Diagnosis not present

## 2022-06-02 DIAGNOSIS — Z992 Dependence on renal dialysis: Secondary | ICD-10-CM | POA: Diagnosis not present

## 2022-06-02 MED ORDER — SODIUM CHLORIDE 0.9% FLUSH: 3.0000 mL | Freq: Two times a day (BID) | INTRAVENOUS | Status: DC

## 2022-06-02 MED ORDER — SODIUM CHLORIDE 0.9 % IV SOLN: 250.0000 mL | INTRAVENOUS | Status: DC | PRN

## 2022-06-02 MED ORDER — SODIUM CHLORIDE 0.9% FLUSH: 3.0000 mL | INTRAVENOUS | Status: DC | PRN

## 2022-06-02 NOTE — Progress Notes (Signed)
Patient name: Jeremy Dorsey Sex: male  REASON FOR VISIT: Right foot toe discoloration, triage  HPI: Jeremy Dorsey is a 71 y.o. male with ESRD and DM that presents for triage for right foot discoloration.  He has noticed red discoloration to the toes on his right foot over the last several weeks.  He is well-known to our practice.  On 01/29/2022 he underwent a right anterior tibial, dorsalis pedis and peroneal angioplasty for tibial disease in the setting of rest pain with dependent rubor in the foot.  All of his rest pain and dependent rubor previously resolved after intervention.  Past Medical History:  Diagnosis Date   Anemia    Cancer ([place])    basal cell nose and forehead   CKD (chronic kidney disease), stage IV (HCC)    Coronary artery disease    a. s/p IVUS-guided DESx2 to prox & mid LAD, residual disease treated medically. EF 50-55% by recent echo 02/2020.   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   Urinary complication     Past Surgical History:  Procedure Laterality Date   ABDOMINAL AORTOGRAM W/LOWER EXTREMITY N/A 01/29/2022   Procedure: ABDOMINAL AORTOGRAM W/ Bilateral LOWER EXTREMITY Runoff;  Surgeon: Marty Heck, MD;  Location: [place] [facility];  Service: Cardiovascular;  Laterality: N/A;   AV FISTULA PLACEMENT Left 10/31/2020   Procedure: LEFT UPPER EXTREMITY ARTERIOVENOUS (AV) FISTULA CREATION;  Surgeon: Waynetta Sandy, MD;  Location: [place];  Service: Vascular;  Laterality: Left;   AV FISTULA PLACEMENT Left 07/09/2021   Procedure: LEFT ARM ARTERIOVENOUS (AV) FISTULA CREATION;  Surgeon: Serafina Mitchell, MD;  Location: [facility];  Service: Vascular;  Laterality: Left;   COLONOSCOPY  1990   COLONOSCOPY  06/09/2011   Dr Bary Castilla   CORONARY STENT INTERVENTION N/A  04/24/2020   Procedure: CORONARY STENT INTERVENTION;  Surgeon: Wellington Hampshire, MD;  Location: [place] [place] [facility];  Service: Cardiovascular;  Laterality: N/A;   CYSTOSCOPY W/ RETROGRADES Bilateral 08/01/2018   Procedure: CYSTOSCOPY WITH RETROGRADE PYELOGRAM;  Surgeon: Billey Co, MD;  Location: [facility];  Service: Urology;  Laterality: Bilateral;   CYSTOSCOPY WITH BIOPSY N/A 08/01/2018   Procedure: CYSTOSCOPY WITH Bladder BIOPSY;  Surgeon: Billey Co, MD;  Location: [facility];  Service: Urology;  Laterality: N/A;   CYSTOSCOPY WITH STENT PLACEMENT Right 08/01/2018   Procedure: CYSTOSCOPY WITH STENT PLACEMENT;  Surgeon: Billey Co, MD;  Location: [facility];  Service: Urology;  Laterality: Right;   EYE SURGERY Right    laser surgery   INTRAVASCULAR ULTRASOUND/IVUS N/A 04/24/2020   Procedure: Intravascular Ultrasound/IVUS;  Surgeon: Wellington Hampshire, MD;  Location: [place] [facility];  Service: Cardiovascular;  Laterality: N/A;   LEFT HEART CATH AND CORONARY ANGIOGRAPHY N/A 04/24/2020   Procedure: LEFT HEART CATH AND CORONARY ANGIOGRAPHY;  Surgeon: Wellington Hampshire, MD;  Location: [facility] [place] [facility];  Service: Cardiovascular;  Laterality: N/A;   PERIPHERAL VASCULAR BALLOON ANGIOPLASTY Right 01/29/2022   Procedure: PERIPHERAL VASCULAR BALLOON ANGIOPLASTY;  Surgeon: Marty Heck, MD;  Location: [place] [facility];  Service: Cardiovascular;  Laterality: Right;   SHOULDER ARTHROSCOPY WITH OPEN ROTATOR CUFF REPAIR Right 08/25/2018   Procedure: SHOULDER ARTHROSCOPY WITH MINI OPEN ROTATOR CUFF  REPAIR;  Surgeon: Thornton Park, MD;  Location: [facility];  Service: Orthopedics;  Laterality: Right;   TRANSURETHRAL RESECTION OF BLADDER TUMOR N/A 08/01/2018   Procedure: TRANSURETHRAL RESECTION OF BLADDER TUMOR (TURBT);  Surgeon: Billey Co, MD;  Location: [facility];  Service: Urology;  Laterality: N/A;   URETEROSCOPY WITH HOLMIUM LASER LITHOTRIPSY Right 08/01/2018   Procedure:  URETEROSCOPY WITH HOLMIUM LASER LITHOTRIPSY;  Surgeon: Billey Co, MD;  Location: [facility];  Service: Urology;  Laterality: Right;    Family History  Problem Relation Age of Onset   Psoriasis Mother    Heart failure Father     SOCIAL HISTORY: Social History   Tobacco Use   Smoking status: Former    Packs/day: 1.00    Years: 30.00    Total pack years: 30.00    Types: Cigarettes    Quit date: 08/23/2007    Years since quitting: 14.7   Smokeless tobacco: Never  Substance Use Topics   Alcohol use: No    Allergies  Allergen Reactions   Oxycodone Nausea And Vomiting   Hydrocodone Nausea And Vomiting    Current Outpatient Medications  Medication Sig Dispense Refill   aspirin 81 MG EC tablet Take 81 mg by mouth in the morning. Swallow whole. 30 tablet 11   atorvastatin (LIPITOR) 80 MG tablet Take 1 tablet (80 mg total) by mouth daily. 90 tablet 3   calcitRIOL (ROCALTROL) 0.25 MCG capsule Take 0.25 mcg by mouth in the morning.     clopidogrel (PLAVIX) 75 MG tablet Take 1 tablet (75 mg total) by mouth daily. 90 tablet 3   lidocaine-prilocaine (EMLA) cream Apply 1 Application topically daily as needed (prior to port being accessed.).     multivitamin (RENA-VIT) TABS tablet Take 1 tablet by mouth daily.     sevelamer carbonate (RENVELA) 800 MG tablet Take 800 mg by mouth with breakfast, with lunch, and with evening meal.     torsemide (DEMADEX) 100 MG tablet Take 100 mg by mouth every morning.     cephALEXin (KEFLEX) 500 MG capsule Take 1 capsule (500 mg total) by mouth 2 (two) times daily. (Patient not taking: Reported on 02/05/2022) 14 capsule 0   nitroGLYCERIN (NITROSTAT) 0.4 MG SL tablet Place 1 tablet (0.4 mg total) under the tongue every 5 (five) minutes x 3 doses as needed for chest pain. (Patient not taking: Reported on 06/02/2022) 25 tablet 3   Current Facility-Administered Medications  Medication Dose Route Frequency Provider Last Rate Last Admin   0.9 %  sodium  chloride infusion  250 mL Intravenous PRN Marty Heck, MD       sodium chloride flush (NS) 0.9 % injection 3 mL  3 mL Intravenous Q12H Marty Heck, MD       sodium chloride flush (NS) 0.9 % injection 3 mL  3 mL Intravenous PRN Marty Heck, MD        REVIEW OF SYSTEMS:  '[X]'$  denotes positive finding, '[ ]'$  denotes negative finding Cardiac  Comments:  Chest pain or chest pressure:    Shortness of breath upon exertion:    Short of breath when lying flat:    Irregular heart rhythm:        Vascular    Pain in calf, thigh, or hip brought on by ambulation:    Pain in feet at night that wakes you up from your sleep:     Blood clot in your veins:    Leg swelling:  Pulmonary    Oxygen at home:    Productive cough:     Wheezing:         Neurologic    Sudden weakness in arms or legs:     Sudden numbness in arms or legs:     Sudden onset of difficulty speaking or slurred speech:    Temporary loss of vision in one eye:     Problems with dizziness:         Gastrointestinal    Blood in stool:     Vomited blood:         Genitourinary    Burning when urinating:     Blood in urine:        Psychiatric    Major depression:         Hematologic    Bleeding problems:    Problems with blood clotting too easily:        Skin    Rashes or ulcers:        Constitutional    Fever or chills:      PHYSICAL EXAM: Vitals:   06/02/22 0830  BP: (!) 155/79  Pulse: 69  Resp: 16  Temp: 97.6 F (36.4 C)  TempSrc: Temporal  SpO2: 96%  Weight: 230 lb (104.3 kg)  Height: '5\' 10"'$  (1.778 m)    GENERAL: The patient is a well-nourished male, in no acute distress. The vital signs are documented above. CARDIAC: There is a regular rate and rhythm.  VASCULAR:  Bilateral femoral pulses palpable Right DP and PT monophasic Dependent rubor right toes as pictured  PULMONARY: No respiratory distress. ABDOMEN: Soft and non-tender.     DATA:   ABI's 0.84 right  monophasic and 1.70 left monophasic.  Flat toe tracings on the right.  Assessment/Plan:  71 y.o. male with ESRD and DM that presents for triage visit with right foot toe discoloration with dependent rubor.  He has previous undergone intervention for rest pain with dependent rubor that resolved.  On 01/29/2022 he underwent a right anterior tibial, dorsalis pedis and peroneal angioplasty for tibial disease in the setting of rest pain with dependent rubor in the foot.   Toe tracings are now flat on the right.  I have recommended another aortogram with lower extremity arteriogram and possible intervention with a focus on the right leg.  He is very concerned about risk of losing his leg.  I offered to get this scheduled Thursday or next week and he has chosen next week.  Risks benefits discussed.   Marty Heck, MD [facility] Office: [phone]

## 2022-06-03 DIAGNOSIS — Z992 Dependence on renal dialysis: Secondary | ICD-10-CM | POA: Diagnosis not present

## 2022-06-03 DIAGNOSIS — N186 End stage renal disease: Secondary | ICD-10-CM | POA: Diagnosis not present

## 2022-06-03 DIAGNOSIS — N2581 Secondary hyperparathyroidism of renal origin: Secondary | ICD-10-CM | POA: Diagnosis not present

## 2022-06-05 DIAGNOSIS — N186 End stage renal disease: Secondary | ICD-10-CM | POA: Diagnosis not present

## 2022-06-05 DIAGNOSIS — Z992 Dependence on renal dialysis: Secondary | ICD-10-CM | POA: Diagnosis not present

## 2022-06-05 DIAGNOSIS — N2581 Secondary hyperparathyroidism of renal origin: Secondary | ICD-10-CM | POA: Diagnosis not present

## 2022-06-08 DIAGNOSIS — N186 End stage renal disease: Secondary | ICD-10-CM | POA: Diagnosis not present

## 2022-06-08 DIAGNOSIS — Z992 Dependence on renal dialysis: Secondary | ICD-10-CM | POA: Diagnosis not present

## 2022-06-08 DIAGNOSIS — N2581 Secondary hyperparathyroidism of renal origin: Secondary | ICD-10-CM | POA: Diagnosis not present

## 2022-06-10 ENCOUNTER — Ambulatory Visit (INDEPENDENT_AMBULATORY_CARE_PROVIDER_SITE_OTHER): Payer: Medicare HMO

## 2022-06-10 DIAGNOSIS — Z23 Encounter for immunization: Secondary | ICD-10-CM

## 2022-06-10 DIAGNOSIS — Z992 Dependence on renal dialysis: Secondary | ICD-10-CM | POA: Diagnosis not present

## 2022-06-10 DIAGNOSIS — N186 End stage renal disease: Secondary | ICD-10-CM | POA: Diagnosis not present

## 2022-06-10 DIAGNOSIS — N2581 Secondary hyperparathyroidism of renal origin: Secondary | ICD-10-CM | POA: Diagnosis not present

## 2022-06-11 ENCOUNTER — Other Ambulatory Visit: Payer: Self-pay

## 2022-06-11 ENCOUNTER — Ambulatory Visit (HOSPITAL_COMMUNITY)
Admission: RE | Disposition: A | Discharge status: Home / Self Care | Payer: Self-pay | Source: Home / Self Care | Attending: Vascular Surgery

## 2022-06-11 ENCOUNTER — Ambulatory Visit (HOSPITAL_COMMUNITY)
Admission: RE | Admit: 2022-06-11 | Discharge: 2022-06-11 | Disposition: A | Discharge status: Home / Self Care | Payer: Medicare HMO | Attending: Vascular Surgery | Admitting: Vascular Surgery

## 2022-06-11 DIAGNOSIS — N186 End stage renal disease: Secondary | ICD-10-CM | POA: Diagnosis not present

## 2022-06-11 DIAGNOSIS — Z87891 Personal history of nicotine dependence: Secondary | ICD-10-CM | POA: Diagnosis not present

## 2022-06-11 DIAGNOSIS — E1122 Type 2 diabetes mellitus with diabetic chronic kidney disease: Secondary | ICD-10-CM | POA: Diagnosis not present

## 2022-06-11 DIAGNOSIS — E1151 Type 2 diabetes mellitus with diabetic peripheral angiopathy without gangrene: Secondary | ICD-10-CM | POA: Diagnosis not present

## 2022-06-11 DIAGNOSIS — Z992 Dependence on renal dialysis: Secondary | ICD-10-CM | POA: Insufficient documentation

## 2022-06-11 DIAGNOSIS — I70221 Atherosclerosis of native arteries of extremities with rest pain, right leg: Secondary | ICD-10-CM | POA: Insufficient documentation

## 2022-06-11 HISTORY — PX: ABDOMINAL AORTOGRAM W/LOWER EXTREMITY: CATH118223

## 2022-06-11 HISTORY — PX: PERIPHERAL VASCULAR BALLOON ANGIOPLASTY: CATH118281

## 2022-06-11 LAB — POCT I-STAT, CHEM 8
BUN: 37 mg/dL — ABNORMAL HIGH (ref 8–23)
Calcium, Ion: 1.18 mmol/L (ref 1.15–1.40)
Chloride: 96 mmol/L — ABNORMAL LOW (ref 98–111)
Creatinine, Ser: 6.5 mg/dL — ABNORMAL HIGH (ref 0.61–1.24)
Glucose, Bld: 172 mg/dL — ABNORMAL HIGH (ref 70–99)
HCT: 32 % — ABNORMAL LOW (ref 39.0–52.0)
Hemoglobin: 10.9 g/dL — ABNORMAL LOW (ref 13.0–17.0)
Potassium: 4.1 mmol/L (ref 3.5–5.1)
Sodium: 134 mmol/L — ABNORMAL LOW (ref 135–145)
TCO2: 28 mmol/L (ref 22–32)

## 2022-06-11 LAB — POCT ACTIVATED CLOTTING TIME: Activated Clotting Time: 251 seconds

## 2022-06-11 LAB — GLUCOSE, CAPILLARY: Glucose-Capillary: 142 mg/dL — ABNORMAL HIGH (ref 70–99)

## 2022-06-11 SURGERY — ABDOMINAL AORTOGRAM W/LOWER EXTREMITY
Anesthesia: LOCAL | Laterality: Right

## 2022-06-11 MED ORDER — SODIUM CHLORIDE 0.9% FLUSH: 3.0000 mL | INTRAVENOUS | Status: DC | PRN

## 2022-06-11 MED ORDER — ONDANSETRON HCL 4 MG/2ML IJ SOLN: 4.0000 mg | Freq: Four times a day (QID) | INTRAMUSCULAR | Status: DC | PRN

## 2022-06-11 MED ORDER — NITROGLYCERIN 1 MG/10 ML FOR IR/CATH LAB
INTRA_ARTERIAL | Status: AC
  Filled 2022-06-11: qty 10

## 2022-06-11 MED ORDER — LIDOCAINE HCL (PF) 1 % IJ SOLN
INTRAMUSCULAR | Status: AC
  Filled 2022-06-11: qty 30

## 2022-06-11 MED ORDER — ACETAMINOPHEN 325 MG PO TABS: 650.0000 mg | ORAL_TABLET | ORAL | Status: DC | PRN

## 2022-06-11 MED ORDER — ASPIRIN 81 MG PO CHEW
CHEWABLE_TABLET | ORAL | Status: DC | PRN
  Administered 2022-06-11: 81 mg via ORAL

## 2022-06-11 MED ORDER — FENTANYL CITRATE (PF) 100 MCG/2ML IJ SOLN
INTRAMUSCULAR | Status: AC
  Filled 2022-06-11: qty 2

## 2022-06-11 MED ORDER — HEPARIN (PORCINE) IN NACL 1000-0.9 UT/500ML-% IV SOLN
INTRAVENOUS | Status: DC | PRN
  Administered 2022-06-11 (×2): 500 mL

## 2022-06-11 MED ORDER — SODIUM CHLORIDE 0.9 % IV SOLN: 250.0000 mL | INTRAVENOUS | Status: DC | PRN

## 2022-06-11 MED ORDER — SODIUM CHLORIDE 0.9% FLUSH: 3.0000 mL | Freq: Two times a day (BID) | INTRAVENOUS | Status: DC

## 2022-06-11 MED ORDER — HEPARIN (PORCINE) IN NACL 1000-0.9 UT/500ML-% IV SOLN
INTRAVENOUS | Status: AC
  Filled 2022-06-11: qty 1000

## 2022-06-11 MED ORDER — ASPIRIN 81 MG PO CHEW
CHEWABLE_TABLET | ORAL | Status: AC
  Filled 2022-06-11: qty 1

## 2022-06-11 MED ORDER — CLOPIDOGREL BISULFATE 75 MG PO TABS
ORAL_TABLET | ORAL | Status: AC
  Filled 2022-06-11: qty 1

## 2022-06-11 MED ORDER — IODIXANOL 320 MG/ML IV SOLN
INTRAVENOUS | Status: DC | PRN
  Administered 2022-06-11: 120 mL

## 2022-06-11 MED ORDER — LIDOCAINE HCL (PF) 1 % IJ SOLN
INTRAMUSCULAR | Status: DC | PRN
  Administered 2022-06-11: 15 mL

## 2022-06-11 MED ORDER — LABETALOL HCL 5 MG/ML IV SOLN: 10.0000 mg | INTRAVENOUS | Status: DC | PRN

## 2022-06-11 MED ORDER — FENTANYL CITRATE (PF) 100 MCG/2ML IJ SOLN
INTRAMUSCULAR | Status: DC | PRN
  Administered 2022-06-11 (×2): 25 ug via INTRAVENOUS

## 2022-06-11 MED ORDER — MIDAZOLAM HCL 2 MG/2ML IJ SOLN
INTRAMUSCULAR | Status: AC
  Filled 2022-06-11: qty 2

## 2022-06-11 MED ORDER — HEPARIN SODIUM (PORCINE) 1000 UNIT/ML IJ SOLN
INTRAMUSCULAR | Status: AC
  Filled 2022-06-11: qty 10

## 2022-06-11 MED ORDER — NITROGLYCERIN 1 MG/10 ML FOR IR/CATH LAB
INTRA_ARTERIAL | Status: DC | PRN
  Administered 2022-06-11: 200 ug via INTRA_ARTERIAL

## 2022-06-11 MED ORDER — HYDRALAZINE HCL 20 MG/ML IJ SOLN: 5.0000 mg | INTRAMUSCULAR | Status: DC | PRN

## 2022-06-11 MED ORDER — CLOPIDOGREL BISULFATE 75 MG PO TABS
ORAL_TABLET | ORAL | Status: DC | PRN
  Administered 2022-06-11: 75 mg via ORAL

## 2022-06-11 MED ORDER — MIDAZOLAM HCL 2 MG/2ML IJ SOLN
INTRAMUSCULAR | Status: DC | PRN
  Administered 2022-06-11: 1 mg via INTRAVENOUS

## 2022-06-11 MED FILL — Heparin Sodium (Porcine) Inj 1000 Unit/ML: INTRAMUSCULAR | Qty: 10 | Status: AC

## 2022-06-11 SURGICAL SUPPLY — 16 items
BALLN COYOTE OTW 2.5X60X150 (BALLOONS) ×2
BALLOON COYOTE OTW 2.5X60X150 (BALLOONS) IMPLANT
CATH OMNI FLUSH 5F 65CM (CATHETERS) IMPLANT
DEVICE CLOSURE MYNXGRIP 5F (Vascular Products) IMPLANT
KIT ENCORE 26 ADVANTAGE (KITS) IMPLANT
KIT MICROPUNCTURE NIT STIFF (SHEATH) IMPLANT
KIT PV (KITS) ×2 IMPLANT
SHEATH CATAPULT 5FR 90 (SHEATH) IMPLANT
SHEATH PINNACLE 5F 10CM (SHEATH) IMPLANT
SHEATH PROBE COVER 6X72 (BAG) IMPLANT
SYR MEDRAD MARK V 150ML (SYRINGE) IMPLANT
TRANSDUCER W/STOPCOCK (MISCELLANEOUS) ×2 IMPLANT
TRAY PV CATH (CUSTOM PROCEDURE TRAY) ×2 IMPLANT
WIRE BENTSON .035X145CM (WIRE) IMPLANT
WIRE ROSEN-J .035X260CM (WIRE) IMPLANT
WIRE SPARTACORE .014X300CM (WIRE) IMPLANT

## 2022-06-11 NOTE — Op Note (Signed)
Patient name: Jeremy Dorsey MRN: [id number] DOB: 06-10-1951 Sex: male  06/11/2022 Pre-operative Diagnosis: Critical limb ischemia of the right lower extremity with rest pain Post-operative diagnosis:  Same Surgeon:  Marty Heck, MD Procedure Performed: 1.  Ultrasound-guided access left common femoral artery 2.  Aortogram with catheter selection of aorta 3.  Right lower extremity arteriogram with selection of third order branches 4.  Right TP trunk and peroneal artery angioplasty (2.5 mm x 60 mm coyote) 5.  Mynx closure of the left common femoral artery 6.  42 minutes of monitored moderate conscious sedation time  Indications: 71 year old male with end-stage renal disease and diabetes that has had previous right leg tibial intervention.  He presented to the clinic with dependent rubor and pain.  He presents today for lower extremity arteriogram with a focus on the right leg after risks and benefits discussed.    Findings:   Aortogram showed no flow-limiting stenosis in the aortoiliac segment.  Right lower extremity arteriogram showed a patent common femoral, profunda, SFA and popliteal artery.  He has significant tibial disease.  Dominant runoff is in the peroneal artery that had a focal high-grade calcified stenosis greater than 90% in the proximal vessel adjacent to the TP trunk.  The posterior tibial is occluded.  The peroneal does reconstitute a posterior tibial at the ankle through collaterals.  The anterior tibial is patent proximally but then occludes in the distal calf with a very diminutive dorsalis pedis that reconstitutes.  Significant small vessel disease in the foot.  The right TP trunk/peroneal artery stenosis was crossed antegrade from contralateral groin access.  This was treated with a 2.5 mm [place] to nominal pressure for 2 minutes.  Excellent results.  No residual stenosis.  Preserved runoff in the peroneal artery.     Procedure:  The patient was identified in  the holding area and taken to room 8.  The patient was then placed supine on the table and prepped and draped in the usual sterile fashion.  A time out was called.  The patient received Versed and fentanyl for moderate sedation.  I was present for all of sedation.  Vital signs were monitored including heart rate, respiratory rate, oxygenation and blood pressure.  Ultrasound was used to evaluate the left common femoral artery.  It was patent .  A digital ultrasound image was acquired.  A micropuncture needle was used to access the left common femoral artery under ultrasound guidance.  An 018 wire was advanced without resistance and a micropuncture sheath was placed.  The 018 wire was removed and a benson wire was placed.  The micropuncture sheath was exchanged for a 5 french sheath.  An omniflush catheter was advanced over the wire to the level of L-1.  An abdominal angiogram was obtained.  Next, using the omniflush catheter and a benson wire, the aortic bifurcation was crossed and the catheter was placed into theright external iliac artery and right runoff was obtained.  Elected for intervention.  We placed a Rosen wire down the right SFA and exchanged for a long 5 French sheath in the left groin over the aortic bifurcation.  Patient was given 100 units/kg IV heparin.  I then used a [place] core wire to get down the TP trunk stenosis into the peroneal artery that was diseased proximally.  We then treated this with a 2.5 mm x 60 mm coyote to nominal pressure for 2 minutes.  We had to treat multiple segments in order to  adequately treat the vessel where it was diseased.  There was no significant residual stenosis.  We did give protamine.  Preserved runoff in the peroneal as single-vessel that makes it to the ankle.  Wires and catheters were removed.  We exchanged for a short 5 French sheath in the left groin.  Mynx closure device was deployed.   Plan: Excellent results today.  Patient now optimized. Patient will  continue aspirin Plavix for dual antiplatelet therapy.  We will arrange follow-up in 1 month.  Marty Heck, MD [facility] Office: [phone]

## 2022-06-11 NOTE — H&P (Signed)
History and Physical Interval Note:  06/11/2022 7:43 AM  Jeremy Dorsey  has presented today for surgery, with the diagnosis of ischemia.  The various methods of treatment have been discussed with the patient and family. After consideration of risks, benefits and other options for treatment, the patient has consented to  Procedure(s): ABDOMINAL AORTOGRAM W/LOWER EXTREMITY (N/A) as a surgical intervention.  The patient's history has been reviewed, patient examined, no change in status, stable for surgery.  I have reviewed the patient's chart and labs.  Questions were answered to the patient's satisfaction.     Marty Heck     Patient name: Jeremy Dorsey      MRN: [id number]        DOB: April 11, 1951          Sex: male   REASON FOR VISIT: Right foot toe discoloration, triage   HPI: Jeremy Dorsey is a 71 y.o. male with ESRD and DM that presents for triage for right foot discoloration.  He has noticed red discoloration to the toes on his right foot over the last several weeks.  He is well-known to our practice.  On 01/29/2022 he underwent a right anterior tibial, dorsalis pedis and peroneal angioplasty for tibial disease in the setting of rest pain with dependent rubor in the foot.  All of his rest pain and dependent rubor previously resolved after intervention.       Past Medical History:  Diagnosis Date   Anemia     Cancer ([place])      basal cell nose and forehead   CKD (chronic kidney disease), stage IV (HCC)     Coronary artery disease      a. s/p IVUS-guided DESx2 to prox & mid LAD, residual disease treated medically. EF 50-55% by recent echo 02/2020.   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   Urinary complication             Past Surgical History:  Procedure Laterality Date   ABDOMINAL AORTOGRAM W/LOWER  EXTREMITY N/A 01/29/2022    Procedure: ABDOMINAL AORTOGRAM W/ Bilateral LOWER EXTREMITY [place];  Surgeon: Marty Heck, MD;  Location: [facility];  Service: Cardiovascular;  Laterality: N/A;   AV FISTULA PLACEMENT Left 10/31/2020    Procedure: LEFT UPPER EXTREMITY ARTERIOVENOUS (AV) FISTULA CREATION;  Surgeon: Waynetta Sandy, MD;  Location: [facility];  Service: Vascular;  Laterality: Left;   AV FISTULA PLACEMENT Left 07/09/2021    Procedure: LEFT ARM ARTERIOVENOUS (AV) FISTULA CREATION;  Surgeon: Serafina Mitchell, MD;  Location: [facility];  Service: Vascular;  Laterality: Left;   COLONOSCOPY   1990   COLONOSCOPY   06/09/2011    Dr Bary Castilla   CORONARY STENT INTERVENTION N/A 04/24/2020    Procedure: CORONARY STENT INTERVENTION;  Surgeon: Wellington Hampshire, MD;  Location: [facility];  Service: Cardiovascular;  Laterality: N/A;   CYSTOSCOPY W/ RETROGRADES Bilateral 08/01/2018    Procedure: CYSTOSCOPY WITH RETROGRADE PYELOGRAM;  Surgeon: Billey Co, MD;  Location: [facility];  Service: Urology;  Laterality: Bilateral;   CYSTOSCOPY WITH BIOPSY N/A 08/01/2018    Procedure: CYSTOSCOPY WITH Bladder BIOPSY;  Surgeon: Billey Co, MD;  Location: [facility];  Service: Urology;  Laterality: N/A;   CYSTOSCOPY WITH STENT PLACEMENT Right 08/01/2018    Procedure: CYSTOSCOPY WITH STENT PLACEMENT;  Surgeon: Billey Co, MD;  Location: [facility];  Service: Urology;  Laterality: Right;   EYE SURGERY Right      laser surgery   INTRAVASCULAR ULTRASOUND/IVUS N/A 04/24/2020    Procedure: Intravascular Ultrasound/IVUS;  Surgeon: Wellington Hampshire, MD;  Location: [facility];  Service: Cardiovascular;  Laterality: N/A;   LEFT HEART CATH AND CORONARY ANGIOGRAPHY N/A 04/24/2020    Procedure: LEFT HEART CATH AND CORONARY ANGIOGRAPHY;  Surgeon: Wellington Hampshire, MD;  Location: [facility];  Service: Cardiovascular;  Laterality: N/A;   PERIPHERAL VASCULAR BALLOON ANGIOPLASTY  Right 01/29/2022    Procedure: PERIPHERAL VASCULAR BALLOON ANGIOPLASTY;  Surgeon: Marty Heck, MD;  Location: [facility];  Service: Cardiovascular;  Laterality: Right;   SHOULDER ARTHROSCOPY WITH OPEN ROTATOR CUFF REPAIR Right 08/25/2018    Procedure: SHOULDER ARTHROSCOPY WITH MINI OPEN ROTATOR CUFF REPAIR;  Surgeon: Thornton Park, MD;  Location: [facility];  Service: Orthopedics;  Laterality: Right;   TRANSURETHRAL RESECTION OF BLADDER TUMOR N/A 08/01/2018    Procedure: TRANSURETHRAL RESECTION OF BLADDER TUMOR (TURBT);  Surgeon: Billey Co, MD;  Location: [facility];  Service: Urology;  Laterality: N/A;   URETEROSCOPY WITH HOLMIUM LASER LITHOTRIPSY Right 08/01/2018    Procedure: URETEROSCOPY WITH HOLMIUM LASER LITHOTRIPSY;  Surgeon: Billey Co, MD;  Location: [facility];  Service: Urology;  Laterality: Right;           Family History  Problem Relation Age of Onset   Psoriasis Mother     Heart failure Father        SOCIAL HISTORY: Social History         Tobacco Use   Smoking status: Former      Packs/day: 1.00      Years: 30.00      Total pack years: 30.00      Types: Cigarettes      Quit date: 08/23/2007      Years since quitting: 14.7   Smokeless tobacco: Never  Substance Use Topics   Alcohol use: No          Allergies  Allergen Reactions   Oxycodone Nausea And Vomiting   Hydrocodone Nausea And Vomiting            Current Outpatient Medications  Medication Sig Dispense Refill   aspirin 81 MG EC tablet Take 81 mg by mouth in the morning. Swallow whole. 30 tablet 11   atorvastatin (LIPITOR) 80 MG tablet Take 1 tablet (80 mg total) by mouth daily. 90 tablet 3   calcitRIOL (ROCALTROL) 0.25 MCG capsule Take 0.25 mcg by mouth in the morning.       clopidogrel (PLAVIX) 75 MG tablet Take 1 tablet (75 mg total) by mouth daily. 90 tablet 3   lidocaine-prilocaine (EMLA) cream Apply 1 Application topically daily as needed (prior to port being accessed.).        multivitamin (RENA-VIT) TABS tablet Take 1 tablet by mouth daily.       sevelamer carbonate (RENVELA) 800 MG tablet Take 800 mg by mouth with breakfast, with lunch, and with evening meal.       torsemide (DEMADEX) 100 MG tablet Take 100 mg by mouth every morning.       cephALEXin (KEFLEX) 500 MG capsule Take 1 capsule (500 mg total) by mouth 2 (two) times daily. (Patient not taking: Reported on 02/05/2022) 14  capsule 0   nitroGLYCERIN (NITROSTAT) 0.4 MG SL tablet Place 1 tablet (0.4 mg total) under the tongue every 5 (five) minutes x 3 doses as needed for chest pain. (Patient not taking: Reported on 06/02/2022) 25 tablet 3             Current Facility-Administered Medications  Medication Dose Route Frequency Provider Last Rate Last Admin   0.9 %  sodium chloride infusion  250 mL Intravenous PRN Marty Heck, MD       sodium chloride flush (NS) 0.9 % injection 3 mL  3 mL Intravenous Q12H Marty Heck, MD       sodium chloride flush (NS) 0.9 % injection 3 mL  3 mL Intravenous PRN Marty Heck, MD          REVIEW OF SYSTEMS:  '[X]'$  denotes positive finding, '[ ]'$  denotes negative finding Cardiac   Comments:  Chest pain or chest pressure:      Shortness of breath upon exertion:      Short of breath when lying flat:      Irregular heart rhythm:             Vascular      Pain in calf, thigh, or hip brought on by ambulation:      Pain in feet at night that wakes you up from your sleep:       Blood clot in your veins:      Leg swelling:              Pulmonary      Oxygen at home:      Productive cough:       Wheezing:              Neurologic      Sudden weakness in arms or legs:       Sudden numbness in arms or legs:       Sudden onset of difficulty speaking or slurred speech:      Temporary loss of vision in one eye:       Problems with dizziness:              Gastrointestinal      Blood in stool:       Vomited blood:              Genitourinary       Burning when urinating:       Blood in urine:             Psychiatric      Major depression:              Hematologic      Bleeding problems:      Problems with blood clotting too easily:             Skin      Rashes or ulcers:             Constitutional      Fever or chills:          PHYSICAL EXAM:    Vitals:    06/02/22 0830  BP: (!) 155/79  Pulse: 69  Resp: 16  Temp: 97.6 F (36.4 C)  TempSrc: Temporal  SpO2: 96%  Weight: 230 lb (104.3 kg)  Height: '5\' 10"'$  (1.778 m)      GENERAL: The patient is a well-nourished male, in no acute distress. The vital signs are documented above. CARDIAC: There is a regular rate  and rhythm.  VASCULAR:  Bilateral femoral pulses palpable Right DP and PT monophasic Dependent rubor right toes as pictured  PULMONARY: No respiratory distress. ABDOMEN: Soft and non-tender.        DATA:    ABI's 0.84 right monophasic and 1.70 left monophasic.  Flat toe tracings on the right.   Assessment/Plan:   71 y.o. male with ESRD and DM that presents for triage visit with right foot toe discoloration with dependent rubor.  He has previous undergone intervention for rest pain with dependent rubor that resolved.  On 01/29/2022 he underwent a right anterior tibial, dorsalis pedis and peroneal angioplasty for tibial disease in the setting of rest pain with dependent rubor in the foot.   Toe tracings are now flat on the right.  I have recommended another aortogram with lower extremity arteriogram and possible intervention with a focus on the right leg.  He is very concerned about risk of losing his leg.  I offered to get this scheduled Thursday or next week and he has chosen next week.  Risks benefits discussed.     Marty Heck, MD [facility] Office: [phone]

## 2022-06-11 NOTE — Progress Notes (Signed)
Pt and wife received d/c instructions, all questions and concerns addressed. Pt did post ambulation and tolerated well, no bleeding or hematoma noted. PIV to be removed. Pt remains in stable condition

## 2022-06-12 ENCOUNTER — Encounter (HOSPITAL_COMMUNITY): Payer: Self-pay | Admitting: Vascular Surgery

## 2022-06-15 ENCOUNTER — Telehealth: Payer: Self-pay

## 2022-06-15 ENCOUNTER — Ambulatory Visit: Payer: Self-pay

## 2022-06-15 ENCOUNTER — Emergency Department
Admission: EM | Admit: 2022-06-15 | Discharge: 2022-06-15 | Disposition: A | Discharge status: Home / Self Care | Payer: Medicare HMO | Attending: Emergency Medicine | Admitting: Emergency Medicine

## 2022-06-15 DIAGNOSIS — R197 Diarrhea, unspecified: Secondary | ICD-10-CM | POA: Diagnosis not present

## 2022-06-15 DIAGNOSIS — N186 End stage renal disease: Secondary | ICD-10-CM | POA: Insufficient documentation

## 2022-06-15 DIAGNOSIS — K921 Melena: Secondary | ICD-10-CM | POA: Diagnosis not present

## 2022-06-15 DIAGNOSIS — Z992 Dependence on renal dialysis: Secondary | ICD-10-CM | POA: Insufficient documentation

## 2022-06-15 DIAGNOSIS — R195 Other fecal abnormalities: Secondary | ICD-10-CM | POA: Insufficient documentation

## 2022-06-15 DIAGNOSIS — N2581 Secondary hyperparathyroidism of renal origin: Secondary | ICD-10-CM | POA: Diagnosis not present

## 2022-06-15 LAB — CBC
HCT: 32.1 % — ABNORMAL LOW (ref 39.0–52.0)
Hemoglobin: 10.9 g/dL — ABNORMAL LOW (ref 13.0–17.0)
MCH: 31.9 pg (ref 26.0–34.0)
MCHC: 34 g/dL (ref 30.0–36.0)
MCV: 93.9 fL (ref 80.0–100.0)
Platelets: 165 10*3/uL (ref 150–400)
RBC: 3.42 MIL/uL — ABNORMAL LOW (ref 4.22–5.81)
RDW: 13.1 % (ref 11.5–15.5)
WBC: 5.8 10*3/uL (ref 4.0–10.5)
nRBC: 0 % (ref 0.0–0.2)

## 2022-06-15 LAB — COMPREHENSIVE METABOLIC PANEL
ALT: 26 U/L (ref 0–44)
AST: 21 U/L (ref 15–41)
Albumin: 4 g/dL (ref 3.5–5.0)
Alkaline Phosphatase: 92 U/L (ref 38–126)
Anion gap: 11 (ref 5–15)
BUN: 50 mg/dL — ABNORMAL HIGH (ref 8–23)
CO2: 26 mmol/L (ref 22–32)
Calcium: 9.9 mg/dL (ref 8.9–10.3)
Chloride: 100 mmol/L (ref 98–111)
Creatinine, Ser: 6.33 mg/dL — ABNORMAL HIGH (ref 0.61–1.24)
GFR, Estimated: 9 mL/min — ABNORMAL LOW (ref >60)
Glucose, Bld: 198 mg/dL — ABNORMAL HIGH (ref 70–99)
Potassium: 4.1 mmol/L (ref 3.5–5.1)
Sodium: 137 mmol/L (ref 135–145)
Total Bilirubin: 1.1 mg/dL (ref 0.3–1.2)
Total Protein: 6.9 g/dL (ref 6.5–8.1)

## 2022-06-15 LAB — LIPASE, BLOOD: Lipase: 58 U/L — ABNORMAL HIGH (ref 11–51)

## 2022-06-15 LAB — TYPE AND SCREEN
ABO/RH(D): A POS
Antibody Screen: NEGATIVE

## 2022-06-15 NOTE — ED Triage Notes (Signed)
Pt sts that he has dark thick stool for the last three days. Pt advises that he is on plavix. Pt is a dialysis pt and last dialyzed today.

## 2022-06-15 NOTE — Telephone Encounter (Signed)
Agree with triage assessment.

## 2022-06-15 NOTE — Telephone Encounter (Signed)
  Chief Complaint: Tarry diarrhea Symptoms: above Frequency: 3-4 days Pertinent Negatives: Patient denies fever, abdominal pain Disposition: '[x]'$ ED /'[]'$ Urgent Care (no appt availability in office) / '[]'$ Appointment(In office/virtual)/ '[]'$  [facility]/ '[]'$ Home Care/ '[]'$ Refused Recommended Disposition /'[]'$ [facility]/ '[]'$  Follow-up with PCP Additional Notes: Spoke with pt's wife - pt on the phone.Pt has had tarry diarrhea for th past 3-4 days. Pt spoke with Dr. Anell Barr office earlier. Pt is finish dialysis before any evaluation is done.  Pt will then go to the ED for evaluation. Pt is on several blood thinners.     Reason for Disposition  Black or tarry bowel movements  (Exception: Chronic-unchanged black-grey BMs AND is taking iron pills or Pepto-Bismol.)  Answer Assessment - Initial Assessment Questions 1. APPEARANCE of BLOOD: "What color is it?" "Is it passed separately, on the surface of the stool, or mixed in with the stool?"      Very dark tarry diarrhea 2. AMOUNT: "How much blood was passed?"      unknown 3. FREQUENCY: "How many times has blood been passed with the stools?"      3-4 days 4. ONSET: "When was the blood first seen in the stools?" (Days or weeks)      3-4 days 5. DIARRHEA: "Is there also some diarrhea?" If Yes, ask: "How many diarrhea stools in the past 24 hours?"      all 6. CONSTIPATION: "Do you have constipation?" If Yes, ask: "How bad is it?"     no 7. RECURRENT SYMPTOMS: "Have you had blood in your stools before?" If Yes, ask: "When was the last time?" and "What happened that time?"       8. BLOOD THINNERS: "Do you take any blood thinners?" (e.g., Coumadin/warfarin, Pradaxa/dabigatran, aspirin)     Yes -plavix baby aspirin and heparin 9. OTHER SYMPTOMS: "Do you have any other symptoms?"  (e.g., abdomen pain, vomiting, dizziness, fever)     heparin 10. PREGNANCY: "Is there any chance you are pregnant?" "When was your last menstrual period?"        na  Answer Assessment - Initial Assessment Questions 1. COLOR: "What color is it?" "Is that color in part or all of the stool?"     Tarry black soft stool 2. ONSET: "When was the unusual color first noted?"     3-4 days 3. CAUSE: "Have you eaten any food or taken any medicine of this color?" Note: See listing in Background Information section.      no 4. OTHER SYMPTOMS: "Do you have any other symptoms?" (e.g., abdomen pain, diarrhea, jaundice, fever).     no  Protocols used: Stools - Unusual Color-A-AH, Rectal Bleeding-A-AH

## 2022-06-15 NOTE — Telephone Encounter (Signed)
Patient's wife called into office stating since patient had his procedure with Dr. Carlis Abbott on 06/11/22 Mr. Vanputten has had black, tarry stools. She states the patient denies having abdominal pains and/or N/V. She does report that patient has had diarrhea. I reviewed patient's chart with our in house [facility] and [facility]. They both advised that the patient should be evaluated by PCP to determine if an Endoscopy will need to be performed to determine where the cause of the bleeding. I advised that he should probably be evaluated sooner than later . Wife voiced her understanding.

## 2022-06-15 NOTE — ED Provider Notes (Signed)
[facility] Provider Note   Event Date/Time   First MD Initiated Contact with Patient 06/15/22 2033     (approximate) History  Diarrhea  HPI Jeremy Dorsey is a 71 y.o. male with stated past medical history of end-stage renal disease on dialysis who presents with wife at bedside complaining that patient has had a change in stool color.  Patient describes a dark red stool over the last 2 days without any associated symptoms.  Patient states that he is only here "because my wife made me come in".  Patient denies any orthostatic lightheadedness, palpitations, or dyspnea on exertion. ROS: Patient currently denies any vision changes, tinnitus, difficulty speaking, facial droop, sore throat, chest pain, shortness of breath, abdominal pain, nausea/vomiting/diarrhea, dysuria, or weakness/numbness/paresthesias in any extremity   Physical Exam  Triage Vital Signs: ED Triage Vitals  Enc Vitals Group     BP 06/15/22 1629 (!) 157/76     Pulse Rate 06/15/22 1629 77     Resp 06/15/22 1629 19     Temp 06/15/22 1628 98.1 F (36.7 C)     Temp Source 06/15/22 1628 Oral     SpO2 06/15/22 1629 98 %     Weight 06/15/22 1628 229 lb (103.9 kg)     Height --      Head Circumference --      Peak Flow --      Pain Score 06/15/22 1628 3     Pain Loc --      Pain Edu? --      Excl. in [place]? --    Most recent vital signs: Vitals:   06/15/22 1629 06/15/22 2101  BP: (!) 157/76 (!) 158/75  Pulse: 77 73  Resp: 19 18  Temp:  99.4 F (37.4 C)  SpO2: 98% 99%   General: Awake, oriented x4. CV:  Good peripheral perfusion.  Resp:  Normal effort.  Abd:  No distention.  Other:  Overweight elderly Caucasian male sitting in chair in exam room in no acute distress.  Rectal exam deferred by patient ED Results / Procedures / Treatments  Labs (all labs ordered are listed, but only abnormal results are displayed) Labs Reviewed  COMPREHENSIVE METABOLIC PANEL - Abnormal; Notable for the  following components:      Result Value   Glucose, Bld 198 (*)    BUN 50 (*)    Creatinine, Ser 6.33 (*)    GFR, Estimated 9 (*)    All other components within normal limits  CBC - Abnormal; Notable for the following components:   RBC 3.42 (*)    Hemoglobin 10.9 (*)    HCT 32.1 (*)    All other components within normal limits  LIPASE, BLOOD - Abnormal; Notable for the following components:   Lipase 58 (*)    All other components within normal limits  POC OCCULT BLOOD, ED  TYPE AND SCREEN  PROCEDURES: Critical Care performed: No Procedures MEDICATIONS ORDERED IN ED: Medications - No data to display IMPRESSION / MDM / [place] / ED COURSE  I reviewed the triage vital signs and the nursing notes.                              Patient's presentation is most consistent with acute presentation with potential threat to life or bodily function. Given history and exam patients presentation most consistent with Lower GI bleed possibly secondary to hemorrhoid or other  nonemergent cause of bleeding. I have low suspicion for Aortoenteric fistula, Upper GI Bleed, IBD, Mesenteric Ischemia, Rectal foreign body or ulcer.  Workup: CBC, BMP, Type and Screen  Disposition: Discharge. Hemodynamically stable with no gross blood on rectal exam. SRP and prompt PCP follow up.   FINAL CLINICAL IMPRESSION(S) / ED DIAGNOSES   Final diagnoses:  Abnormal stool color   Rx / DC Orders   ED Discharge Orders     None      Note:  This document was prepared using Dragon voice recognition software and may include unintentional dictation errors.   Naaman Plummer, MD 06/15/22 [phone]

## 2022-06-15 NOTE — ED Provider Triage Note (Signed)
Emergency Medicine Provider Triage Evaluation Note  Jeremy Dorsey , a 71 y.o. male  was evaluated in triage.  Pt complains of diarrhea, possibly bloody x  days. On dialysis, did complete today. On warfarin. Abd cramping without specific pain.  Review of Systems  Positive: Diarrhea, possibly bloody, abd cramping Negative: Fever, chills, emesis  Physical Exam  BP (!) 157/76 (BP Location: Right Arm)   Pulse 77   Temp 98.1 F (36.7 C) (Oral)   Resp 19   Wt 103.9 kg   SpO2 98%   BMI 32.86 kg/m  Gen:   Awake, no distress   Resp:  Normal effort  MSK:   Moves extremities without difficulty  Other:    Medical Decision Making  Medically screening exam initiated at 4:31 PM.  Appropriate orders placed.  Jeremy Dorsey was informed that the remainder of the evaluation will be completed by another provider, this initial triage assessment does not replace that evaluation, and the importance of remaining in the ED until their evaluation is complete.  Labs ordered   Jeremy Dorsey 06/15/22 1633

## 2022-06-17 DIAGNOSIS — Z992 Dependence on renal dialysis: Secondary | ICD-10-CM | POA: Diagnosis not present

## 2022-06-17 DIAGNOSIS — N186 End stage renal disease: Secondary | ICD-10-CM | POA: Diagnosis not present

## 2022-06-17 DIAGNOSIS — N2581 Secondary hyperparathyroidism of renal origin: Secondary | ICD-10-CM | POA: Diagnosis not present

## 2022-06-19 DIAGNOSIS — N186 End stage renal disease: Secondary | ICD-10-CM | POA: Diagnosis not present

## 2022-06-19 DIAGNOSIS — N2581 Secondary hyperparathyroidism of renal origin: Secondary | ICD-10-CM | POA: Diagnosis not present

## 2022-06-19 DIAGNOSIS — Z992 Dependence on renal dialysis: Secondary | ICD-10-CM | POA: Diagnosis not present

## 2022-06-22 DIAGNOSIS — N2581 Secondary hyperparathyroidism of renal origin: Secondary | ICD-10-CM | POA: Diagnosis not present

## 2022-06-22 DIAGNOSIS — N186 End stage renal disease: Secondary | ICD-10-CM | POA: Diagnosis not present

## 2022-06-22 DIAGNOSIS — Z992 Dependence on renal dialysis: Secondary | ICD-10-CM | POA: Diagnosis not present

## 2022-06-24 DIAGNOSIS — N186 End stage renal disease: Secondary | ICD-10-CM | POA: Diagnosis not present

## 2022-06-24 DIAGNOSIS — Z992 Dependence on renal dialysis: Secondary | ICD-10-CM | POA: Diagnosis not present

## 2022-06-24 DIAGNOSIS — N2581 Secondary hyperparathyroidism of renal origin: Secondary | ICD-10-CM | POA: Diagnosis not present

## 2022-06-26 DIAGNOSIS — Z992 Dependence on renal dialysis: Secondary | ICD-10-CM | POA: Diagnosis not present

## 2022-06-26 DIAGNOSIS — N2581 Secondary hyperparathyroidism of renal origin: Secondary | ICD-10-CM | POA: Diagnosis not present

## 2022-06-26 DIAGNOSIS — N186 End stage renal disease: Secondary | ICD-10-CM | POA: Diagnosis not present

## 2022-06-29 DIAGNOSIS — N186 End stage renal disease: Secondary | ICD-10-CM | POA: Diagnosis not present

## 2022-06-29 DIAGNOSIS — N2581 Secondary hyperparathyroidism of renal origin: Secondary | ICD-10-CM | POA: Diagnosis not present

## 2022-06-29 DIAGNOSIS — Z992 Dependence on renal dialysis: Secondary | ICD-10-CM | POA: Diagnosis not present

## 2022-07-01 DIAGNOSIS — N186 End stage renal disease: Secondary | ICD-10-CM | POA: Diagnosis not present

## 2022-07-01 DIAGNOSIS — N2581 Secondary hyperparathyroidism of renal origin: Secondary | ICD-10-CM | POA: Diagnosis not present

## 2022-07-01 DIAGNOSIS — Z992 Dependence on renal dialysis: Secondary | ICD-10-CM | POA: Diagnosis not present

## 2022-07-02 ENCOUNTER — Telehealth: Payer: Self-pay

## 2022-07-02 DIAGNOSIS — Z992 Dependence on renal dialysis: Secondary | ICD-10-CM | POA: Diagnosis not present

## 2022-07-02 DIAGNOSIS — I129 Hypertensive chronic kidney disease with stage 1 through stage 4 chronic kidney disease, or unspecified chronic kidney disease: Secondary | ICD-10-CM | POA: Diagnosis not present

## 2022-07-02 DIAGNOSIS — N186 End stage renal disease: Secondary | ICD-10-CM | POA: Diagnosis not present

## 2022-07-02 NOTE — Telephone Encounter (Signed)
     Patient  visit on 06/15/2022  at [facility] was for Other fecal abnormalities.  Have you been able to follow up with your primary care physician? No  The patient was or was not able to obtain any needed medicine or equipment. No medications prescribed.  Are there diet recommendations that you are having difficulty following? No  Patient expresses understanding of discharge instructions and education provided has no other needs at this time.    [facility] Resource Care Guide   [email or web address]  ?? [phone]   [email or web address]: [email or web address]  [email or web address]

## 2022-07-03 DIAGNOSIS — N186 End stage renal disease: Secondary | ICD-10-CM | POA: Diagnosis not present

## 2022-07-03 DIAGNOSIS — Z992 Dependence on renal dialysis: Secondary | ICD-10-CM | POA: Diagnosis not present

## 2022-07-03 DIAGNOSIS — N2581 Secondary hyperparathyroidism of renal origin: Secondary | ICD-10-CM | POA: Diagnosis not present

## 2022-07-06 DIAGNOSIS — N186 End stage renal disease: Secondary | ICD-10-CM | POA: Diagnosis not present

## 2022-07-06 DIAGNOSIS — N2581 Secondary hyperparathyroidism of renal origin: Secondary | ICD-10-CM | POA: Diagnosis not present

## 2022-07-06 DIAGNOSIS — Z992 Dependence on renal dialysis: Secondary | ICD-10-CM | POA: Diagnosis not present

## 2022-07-07 ENCOUNTER — Other Ambulatory Visit: Payer: Self-pay | Admitting: *Deleted

## 2022-07-07 DIAGNOSIS — I70221 Atherosclerosis of native arteries of extremities with rest pain, right leg: Secondary | ICD-10-CM

## 2022-07-07 DIAGNOSIS — I739 Peripheral vascular disease, unspecified: Secondary | ICD-10-CM

## 2022-07-08 DIAGNOSIS — N186 End stage renal disease: Secondary | ICD-10-CM | POA: Diagnosis not present

## 2022-07-08 DIAGNOSIS — N2581 Secondary hyperparathyroidism of renal origin: Secondary | ICD-10-CM | POA: Diagnosis not present

## 2022-07-08 DIAGNOSIS — Z992 Dependence on renal dialysis: Secondary | ICD-10-CM | POA: Diagnosis not present

## 2022-07-10 DIAGNOSIS — N2581 Secondary hyperparathyroidism of renal origin: Secondary | ICD-10-CM | POA: Diagnosis not present

## 2022-07-10 DIAGNOSIS — N186 End stage renal disease: Secondary | ICD-10-CM | POA: Diagnosis not present

## 2022-07-10 DIAGNOSIS — Z992 Dependence on renal dialysis: Secondary | ICD-10-CM | POA: Diagnosis not present

## 2022-07-13 DIAGNOSIS — N186 End stage renal disease: Secondary | ICD-10-CM | POA: Diagnosis not present

## 2022-07-13 DIAGNOSIS — Z992 Dependence on renal dialysis: Secondary | ICD-10-CM | POA: Diagnosis not present

## 2022-07-13 DIAGNOSIS — N2581 Secondary hyperparathyroidism of renal origin: Secondary | ICD-10-CM | POA: Diagnosis not present

## 2022-07-14 ENCOUNTER — Ambulatory Visit (INDEPENDENT_AMBULATORY_CARE_PROVIDER_SITE_OTHER)
Admission: RE | Admit: 2022-07-14 | Discharge: 2022-07-14 | Disposition: A | Discharge status: Home / Self Care | Payer: Medicare HMO | Source: Ambulatory Visit | Attending: Vascular Surgery | Admitting: Vascular Surgery

## 2022-07-14 ENCOUNTER — Other Ambulatory Visit: Payer: Self-pay | Admitting: *Deleted

## 2022-07-14 ENCOUNTER — Ambulatory Visit (INDEPENDENT_AMBULATORY_CARE_PROVIDER_SITE_OTHER): Payer: Medicare HMO | Admitting: Vascular Surgery

## 2022-07-14 ENCOUNTER — Ambulatory Visit (HOSPITAL_COMMUNITY)
Admission: RE | Admit: 2022-07-14 | Discharge: 2022-07-14 | Disposition: A | Discharge status: Home / Self Care | Payer: Medicare HMO | Source: Ambulatory Visit | Attending: Vascular Surgery | Admitting: Vascular Surgery

## 2022-07-14 ENCOUNTER — Encounter: Payer: Self-pay | Admitting: Vascular Surgery

## 2022-07-14 VITALS — BP 144/71 | HR 83 | Temp 97.9°F | Resp 18 | Ht 70.0 in | Wt 229.0 lb

## 2022-07-14 DIAGNOSIS — N185 Chronic kidney disease, stage 5: Secondary | ICD-10-CM | POA: Diagnosis not present

## 2022-07-14 DIAGNOSIS — I70203 Unspecified atherosclerosis of native arteries of extremities, bilateral legs: Secondary | ICD-10-CM | POA: Diagnosis not present

## 2022-07-14 DIAGNOSIS — N186 End stage renal disease: Secondary | ICD-10-CM | POA: Diagnosis not present

## 2022-07-14 DIAGNOSIS — I739 Peripheral vascular disease, unspecified: Secondary | ICD-10-CM | POA: Insufficient documentation

## 2022-07-14 DIAGNOSIS — Z992 Dependence on renal dialysis: Secondary | ICD-10-CM | POA: Diagnosis not present

## 2022-07-14 DIAGNOSIS — I70221 Atherosclerosis of native arteries of extremities with rest pain, right leg: Secondary | ICD-10-CM | POA: Insufficient documentation

## 2022-07-14 DIAGNOSIS — I70201 Unspecified atherosclerosis of native arteries of extremities, right leg: Secondary | ICD-10-CM

## 2022-07-14 NOTE — Progress Notes (Signed)
Patient name: Jeremy Dorsey MRN: [id number] DOB: [birth year] Sex: male  REASON FOR VISIT: Hospital follow-up  HPI: Jeremy Dorsey is a 71 y.o. male with ESRD and DM that presents for hospital follow-up after recent right leg intervention for critical limb ischemia with rest pain.  He previously had a right anterior tibial/dorsalis pedis and peroneal angioplasty on 01/09/2022.  He then had recurrent pain in the foot and most recently on 06/11/2022 we performed a right TP trunk/peroneal angioplasty with recurrent high grade stenosis  The foot discoloration is doing much better.  His new concern is swelling in the left hand that has been ongoing since the weekend.  He has a left arm AV fistula and does home dialysis.  He states this worked last night.  Got a fistula duplex of his left arm today.  Past Medical History:  Diagnosis Date   Anemia    Cancer ([place])    basal cell nose and forehead   CKD (chronic kidney disease), stage IV (HCC)    Coronary artery disease    a. s/p IVUS-guided DESx2 to prox & mid LAD, residual disease treated medically. EF 50-55% by recent echo 02/2020.   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   Urinary complication     Past Surgical History:  Procedure Laterality Date   ABDOMINAL AORTOGRAM W/LOWER EXTREMITY N/A 01/29/2022   Procedure: ABDOMINAL AORTOGRAM W/ Bilateral LOWER EXTREMITY Runoff;  Surgeon: Marty Heck, MD;  Location: [facility];  Service: Cardiovascular;  Laterality: N/A;   ABDOMINAL AORTOGRAM W/LOWER EXTREMITY N/A 06/11/2022   Procedure: ABDOMINAL AORTOGRAM W/LOWER EXTREMITY;  Surgeon: Marty Heck, MD;  Location: [facility];  Service: Cardiovascular;  Laterality: N/A;   AV FISTULA PLACEMENT Left 10/31/2020   Procedure: LEFT UPPER EXTREMITY ARTERIOVENOUS (AV)  FISTULA CREATION;  Surgeon: Waynetta Sandy, MD;  Location: [facility];  Service: Vascular;  Laterality: Left;   AV FISTULA PLACEMENT Left 07/09/2021   Procedure: LEFT ARM ARTERIOVENOUS (AV) FISTULA CREATION;  Surgeon: Serafina Mitchell, MD;  Location: [facility];  Service: Vascular;  Laterality: Left;   COLONOSCOPY  1990   COLONOSCOPY  06/09/2011   Dr Bary Castilla   CORONARY STENT INTERVENTION N/A 04/24/2020   Procedure: CORONARY STENT INTERVENTION;  Surgeon: Wellington Hampshire, MD;  Location: [facility];  Service: Cardiovascular;  Laterality: N/A;   CYSTOSCOPY W/ RETROGRADES Bilateral 08/01/2018   Procedure: CYSTOSCOPY WITH RETROGRADE PYELOGRAM;  Surgeon: Billey Co, MD;  Location: [facility];  Service: Urology;  Laterality: Bilateral;   CYSTOSCOPY WITH BIOPSY N/A 08/01/2018   Procedure: CYSTOSCOPY WITH Bladder BIOPSY;  Surgeon: Billey Co, MD;  Location: [facility];  Service: Urology;  Laterality: N/A;   CYSTOSCOPY WITH STENT PLACEMENT Right 08/01/2018   Procedure: CYSTOSCOPY WITH STENT PLACEMENT;  Surgeon: Billey Co, MD;  Location: [facility];  Service: Urology;  Laterality: Right;   EYE SURGERY Right    laser surgery   INTRAVASCULAR ULTRASOUND/IVUS N/A 04/24/2020   Procedure: Intravascular Ultrasound/IVUS;  Surgeon: Wellington Hampshire, MD;  Location: [facility];  Service: Cardiovascular;  Laterality: N/A;   LEFT HEART CATH AND CORONARY ANGIOGRAPHY N/A 04/24/2020   Procedure: LEFT HEART CATH AND CORONARY ANGIOGRAPHY;  Surgeon: Wellington Hampshire, MD;  Location: [facility];  Service: Cardiovascular;  Laterality: N/A;   PERIPHERAL VASCULAR BALLOON ANGIOPLASTY Right 01/29/2022   Procedure: PERIPHERAL VASCULAR BALLOON ANGIOPLASTY;  Surgeon: Marty Heck, MD;  Location: [facility];  Service: Cardiovascular;  Laterality: Right;   PERIPHERAL VASCULAR BALLOON ANGIOPLASTY Right 06/11/2022   Procedure: PERIPHERAL VASCULAR BALLOON ANGIOPLASTY;  Surgeon: Marty Heck, MD;  Location: [facility];  Service: Cardiovascular;  Laterality: Right;  Peroneal   SHOULDER ARTHROSCOPY WITH OPEN ROTATOR CUFF REPAIR Right 08/25/2018   Procedure: SHOULDER ARTHROSCOPY WITH MINI OPEN ROTATOR CUFF REPAIR;  Surgeon: Thornton Park, MD;  Location: [facility];  Service: Orthopedics;  Laterality: Right;   TRANSURETHRAL RESECTION OF BLADDER TUMOR N/A 08/01/2018   Procedure: TRANSURETHRAL RESECTION OF BLADDER TUMOR (TURBT);  Surgeon: Billey Co, MD;  Location: [facility];  Service: Urology;  Laterality: N/A;   URETEROSCOPY WITH HOLMIUM LASER LITHOTRIPSY Right 08/01/2018   Procedure: URETEROSCOPY WITH HOLMIUM LASER LITHOTRIPSY;  Surgeon: Billey Co, MD;  Location: [facility];  Service: Urology;  Laterality: Right;    Family History  Problem Relation Age of Onset   Psoriasis Mother    Heart failure Father     SOCIAL HISTORY: Social History   Tobacco Use   Smoking status: Former    Packs/day: 1.00    Years: 30.00    Total pack years: 30.00    Types: Cigarettes    Quit date: 08/23/2007    Years since quitting: 14.9   Smokeless tobacco: Never  Substance Use Topics   Alcohol use: No    Allergies  Allergen Reactions   Oxycodone Nausea And Vomiting   Hydrocodone Nausea And Vomiting    Current Outpatient Medications  Medication Sig Dispense Refill   aspirin 81 MG EC tablet Take 81 mg by mouth in the morning. Swallow whole. 30 tablet 11   atorvastatin (LIPITOR) 80 MG tablet Take 1 tablet (80 mg total) by mouth daily. 90 tablet 3   AURYXIA 1 GM 210 MG(Fe) tablet Take by mouth.     calcitRIOL (ROCALTROL) 0.25 MCG capsule Take 0.25 mcg by mouth in the morning.     clopidogrel (PLAVIX) 75 MG tablet Take 1 tablet (75 mg total) by mouth daily. 90 tablet 3   lidocaine-prilocaine (EMLA) cream Apply 1 Application topically daily as needed (prior to port being accessed.).     multivitamin (RENA-VIT) TABS tablet Take 1 tablet by mouth daily.      nitroGLYCERIN (NITROSTAT) 0.4 MG SL tablet Place 1 tablet (0.4 mg total) under the tongue every 5 (five) minutes x 3 doses as needed for chest pain. 25 tablet 3   torsemide (DEMADEX) 100 MG tablet Take 100 mg by mouth every morning.     sevelamer carbonate (RENVELA) 800 MG tablet Take 800 mg by mouth with breakfast, with lunch, and with evening meal. (Patient not taking: Reported on 07/14/2022)     Current Facility-Administered Medications  Medication Dose Route Frequency Provider Last Rate Last Admin   0.9 %  sodium chloride infusion  250 mL Intravenous PRN Marty Heck, MD       0.9 %  sodium chloride infusion  250 mL Intravenous PRN Marty Heck, MD       sodium chloride flush (NS) 0.9 % injection 3 mL  3 mL Intravenous Q12H Marty Heck, MD       sodium chloride flush (NS) 0.9 % injection 3 mL  3 mL Intravenous PRN Marty Heck,  MD       sodium chloride flush (NS) 0.9 % injection 3 mL  3 mL Intravenous Q12H Marty Heck, MD       sodium chloride flush (NS) 0.9 % injection 3 mL  3 mL Intravenous PRN Marty Heck, MD        REVIEW OF SYSTEMS:  '[X]'$  denotes positive finding, '[ ]'$  denotes negative finding Cardiac  Comments:  Chest pain or chest pressure:    Shortness of breath upon exertion:    Short of breath when lying flat:    Irregular heart rhythm:        Vascular    Pain in calf, thigh, or hip brought on by ambulation:    Pain in feet at night that wakes you up from your sleep:     Blood clot in your veins:    Leg swelling:         Pulmonary    Oxygen at home:    Productive cough:     Wheezing:         Neurologic    Sudden weakness in arms or legs:     Sudden numbness in arms or legs:     Sudden onset of difficulty speaking or slurred speech:    Temporary loss of vision in one eye:     Problems with dizziness:         Gastrointestinal    Blood in stool:     Vomited blood:         Genitourinary    Burning when urinating:      Blood in urine:        Psychiatric    Major depression:         Hematologic    Bleeding problems:    Problems with blood clotting too easily:        Skin    Rashes or ulcers:        Constitutional    Fever or chills:      PHYSICAL EXAM: Vitals:   07/14/22 1008  BP: (!) 144/71  Pulse: 83  Resp: 18  Temp: 97.9 F (36.6 C)  TempSrc: Temporal  SpO2: 95%  Weight: 229 lb (103.9 kg)  Height: '5\' 10"'$  (1.778 m)    GENERAL: The patient is a well-nourished male, in no acute distress. The vital signs are documented above. CARDIAC: There is a regular rate and rhythm.  VASCULAR:  Dependent rubor right foot improving with no open ulcerations Peroneal signal at the right ankle brisk Left AV fistula with palpable thrill Notable left arm swelling particularly in the hand PULMONARY: No respiratory distress. ABDOMEN: Soft and non-tender.   DATA:   Right lower extremity arterial duplex shows no evidence of residual high-grade stenosis.  Left arm fistula duplex with no significant stenosis with patent fistula but there is some narrowing of the outflow vein  Assessment/Plan:  71 y.o. male with ESRD and DM that presents for hospital follow-up.  On 01/29/2022 he underwent a right anterior tibial, dorsalis pedis and peroneal angioplasty for tibial disease in the setting of rest pain with dependent rubor in the foot.   Then had recurrent symptoms and most recently underwent a right TP trunk/peroneal angioplasty again on 06/11/2022.  The dependent rubor in the foot looks much better.  No evidence of recurrent high-grade stenosis.  Very pleased with his progress.  Great peroneal signal at the right ankle.  He is most concerned about left arm swelling today and specifically hand  swelling.  Fistula duplex today shows no high-grade stenosis.  No other thrombus was visualized.  I wrapped his arm with an Ace from the hand up to the shoulder.  Discussed if no improvement will do a fistulogram next  week given some outflow stenosis noted on duplex.  Marty Heck, MD [facility] Office: [phone]

## 2022-07-15 ENCOUNTER — Telehealth: Payer: Self-pay

## 2022-07-15 DIAGNOSIS — N186 End stage renal disease: Secondary | ICD-10-CM | POA: Diagnosis not present

## 2022-07-15 DIAGNOSIS — I871 Compression of vein: Secondary | ICD-10-CM | POA: Diagnosis not present

## 2022-07-15 DIAGNOSIS — T82898A Other specified complication of vascular prosthetic devices, implants and grafts, initial encounter: Secondary | ICD-10-CM | POA: Diagnosis not present

## 2022-07-15 DIAGNOSIS — Z992 Dependence on renal dialysis: Secondary | ICD-10-CM | POA: Diagnosis not present

## 2022-07-15 NOTE — Telephone Encounter (Signed)
Attempted to reach patient to schedule fistulogram. Left message for patient to return call at both home/cell number.

## 2022-07-16 DIAGNOSIS — Z992 Dependence on renal dialysis: Secondary | ICD-10-CM | POA: Diagnosis not present

## 2022-07-16 DIAGNOSIS — N186 End stage renal disease: Secondary | ICD-10-CM | POA: Diagnosis not present

## 2022-07-16 DIAGNOSIS — N2581 Secondary hyperparathyroidism of renal origin: Secondary | ICD-10-CM | POA: Diagnosis not present

## 2022-07-16 NOTE — Telephone Encounter (Signed)
Spoke with patient who reports that his renal doctor had him complete the fistulogram on yesterday at [facility] and states "a blockage was found in my arm".  Will inform Dr. Carlis Abbott of this information.

## 2022-07-18 DIAGNOSIS — N186 End stage renal disease: Secondary | ICD-10-CM | POA: Diagnosis not present

## 2022-07-18 DIAGNOSIS — Z992 Dependence on renal dialysis: Secondary | ICD-10-CM | POA: Diagnosis not present

## 2022-07-18 DIAGNOSIS — N2581 Secondary hyperparathyroidism of renal origin: Secondary | ICD-10-CM | POA: Diagnosis not present

## 2022-07-20 DIAGNOSIS — Z992 Dependence on renal dialysis: Secondary | ICD-10-CM | POA: Diagnosis not present

## 2022-07-20 DIAGNOSIS — N186 End stage renal disease: Secondary | ICD-10-CM | POA: Diagnosis not present

## 2022-07-20 DIAGNOSIS — N2581 Secondary hyperparathyroidism of renal origin: Secondary | ICD-10-CM | POA: Diagnosis not present

## 2022-07-22 DIAGNOSIS — Z992 Dependence on renal dialysis: Secondary | ICD-10-CM | POA: Diagnosis not present

## 2022-07-22 DIAGNOSIS — N2581 Secondary hyperparathyroidism of renal origin: Secondary | ICD-10-CM | POA: Diagnosis not present

## 2022-07-22 DIAGNOSIS — N186 End stage renal disease: Secondary | ICD-10-CM | POA: Diagnosis not present

## 2022-07-25 DIAGNOSIS — N2581 Secondary hyperparathyroidism of renal origin: Secondary | ICD-10-CM | POA: Diagnosis not present

## 2022-07-25 DIAGNOSIS — Z992 Dependence on renal dialysis: Secondary | ICD-10-CM | POA: Diagnosis not present

## 2022-07-25 DIAGNOSIS — N186 End stage renal disease: Secondary | ICD-10-CM | POA: Diagnosis not present

## 2022-07-28 DIAGNOSIS — Z992 Dependence on renal dialysis: Secondary | ICD-10-CM | POA: Diagnosis not present

## 2022-07-28 DIAGNOSIS — N2581 Secondary hyperparathyroidism of renal origin: Secondary | ICD-10-CM | POA: Diagnosis not present

## 2022-07-28 DIAGNOSIS — N186 End stage renal disease: Secondary | ICD-10-CM | POA: Diagnosis not present

## 2022-07-30 DIAGNOSIS — N186 End stage renal disease: Secondary | ICD-10-CM | POA: Diagnosis not present

## 2022-07-30 DIAGNOSIS — Z992 Dependence on renal dialysis: Secondary | ICD-10-CM | POA: Diagnosis not present

## 2022-07-30 DIAGNOSIS — N2581 Secondary hyperparathyroidism of renal origin: Secondary | ICD-10-CM | POA: Diagnosis not present

## 2022-08-02 DIAGNOSIS — I129 Hypertensive chronic kidney disease with stage 1 through stage 4 chronic kidney disease, or unspecified chronic kidney disease: Secondary | ICD-10-CM | POA: Diagnosis not present

## 2022-08-02 DIAGNOSIS — Z992 Dependence on renal dialysis: Secondary | ICD-10-CM | POA: Diagnosis not present

## 2022-08-02 DIAGNOSIS — N186 End stage renal disease: Secondary | ICD-10-CM | POA: Diagnosis not present

## 2022-08-02 DIAGNOSIS — N2581 Secondary hyperparathyroidism of renal origin: Secondary | ICD-10-CM | POA: Diagnosis not present

## 2022-08-04 DIAGNOSIS — Z992 Dependence on renal dialysis: Secondary | ICD-10-CM | POA: Diagnosis not present

## 2022-08-04 DIAGNOSIS — N186 End stage renal disease: Secondary | ICD-10-CM | POA: Diagnosis not present

## 2022-08-04 DIAGNOSIS — N2581 Secondary hyperparathyroidism of renal origin: Secondary | ICD-10-CM | POA: Diagnosis not present

## 2022-08-06 DIAGNOSIS — N186 End stage renal disease: Secondary | ICD-10-CM | POA: Diagnosis not present

## 2022-08-06 DIAGNOSIS — N2581 Secondary hyperparathyroidism of renal origin: Secondary | ICD-10-CM | POA: Diagnosis not present

## 2022-08-06 DIAGNOSIS — Z992 Dependence on renal dialysis: Secondary | ICD-10-CM | POA: Diagnosis not present

## 2022-08-07 DIAGNOSIS — N186 End stage renal disease: Secondary | ICD-10-CM | POA: Diagnosis not present

## 2022-08-07 DIAGNOSIS — Z992 Dependence on renal dialysis: Secondary | ICD-10-CM | POA: Diagnosis not present

## 2022-08-07 DIAGNOSIS — N2581 Secondary hyperparathyroidism of renal origin: Secondary | ICD-10-CM | POA: Diagnosis not present

## 2022-08-10 DIAGNOSIS — N186 End stage renal disease: Secondary | ICD-10-CM | POA: Diagnosis not present

## 2022-08-10 DIAGNOSIS — N2581 Secondary hyperparathyroidism of renal origin: Secondary | ICD-10-CM | POA: Diagnosis not present

## 2022-08-10 DIAGNOSIS — Z992 Dependence on renal dialysis: Secondary | ICD-10-CM | POA: Diagnosis not present

## 2022-08-12 DIAGNOSIS — N186 End stage renal disease: Secondary | ICD-10-CM | POA: Diagnosis not present

## 2022-08-12 DIAGNOSIS — N2581 Secondary hyperparathyroidism of renal origin: Secondary | ICD-10-CM | POA: Diagnosis not present

## 2022-08-12 DIAGNOSIS — Z992 Dependence on renal dialysis: Secondary | ICD-10-CM | POA: Diagnosis not present

## 2022-08-14 DIAGNOSIS — N186 End stage renal disease: Secondary | ICD-10-CM | POA: Diagnosis not present

## 2022-08-14 DIAGNOSIS — Z992 Dependence on renal dialysis: Secondary | ICD-10-CM | POA: Diagnosis not present

## 2022-08-14 DIAGNOSIS — N2581 Secondary hyperparathyroidism of renal origin: Secondary | ICD-10-CM | POA: Diagnosis not present

## 2022-08-17 DIAGNOSIS — N186 End stage renal disease: Secondary | ICD-10-CM | POA: Diagnosis not present

## 2022-08-17 DIAGNOSIS — N2581 Secondary hyperparathyroidism of renal origin: Secondary | ICD-10-CM | POA: Diagnosis not present

## 2022-08-17 DIAGNOSIS — Z992 Dependence on renal dialysis: Secondary | ICD-10-CM | POA: Diagnosis not present

## 2022-08-19 DIAGNOSIS — N186 End stage renal disease: Secondary | ICD-10-CM | POA: Diagnosis not present

## 2022-08-19 DIAGNOSIS — N2581 Secondary hyperparathyroidism of renal origin: Secondary | ICD-10-CM | POA: Diagnosis not present

## 2022-08-19 DIAGNOSIS — Z992 Dependence on renal dialysis: Secondary | ICD-10-CM | POA: Diagnosis not present

## 2022-08-21 DIAGNOSIS — Z992 Dependence on renal dialysis: Secondary | ICD-10-CM | POA: Diagnosis not present

## 2022-08-21 DIAGNOSIS — N2581 Secondary hyperparathyroidism of renal origin: Secondary | ICD-10-CM | POA: Diagnosis not present

## 2022-08-21 DIAGNOSIS — N186 End stage renal disease: Secondary | ICD-10-CM | POA: Diagnosis not present

## 2022-08-24 DIAGNOSIS — N2581 Secondary hyperparathyroidism of renal origin: Secondary | ICD-10-CM | POA: Diagnosis not present

## 2022-08-24 DIAGNOSIS — N186 End stage renal disease: Secondary | ICD-10-CM | POA: Diagnosis not present

## 2022-08-24 DIAGNOSIS — Z992 Dependence on renal dialysis: Secondary | ICD-10-CM | POA: Diagnosis not present

## 2022-08-26 DIAGNOSIS — N186 End stage renal disease: Secondary | ICD-10-CM | POA: Diagnosis not present

## 2022-08-26 DIAGNOSIS — Z992 Dependence on renal dialysis: Secondary | ICD-10-CM | POA: Diagnosis not present

## 2022-08-26 DIAGNOSIS — N2581 Secondary hyperparathyroidism of renal origin: Secondary | ICD-10-CM | POA: Diagnosis not present

## 2022-08-28 DIAGNOSIS — N186 End stage renal disease: Secondary | ICD-10-CM | POA: Diagnosis not present

## 2022-08-28 DIAGNOSIS — N2581 Secondary hyperparathyroidism of renal origin: Secondary | ICD-10-CM | POA: Diagnosis not present

## 2022-08-28 DIAGNOSIS — Z992 Dependence on renal dialysis: Secondary | ICD-10-CM | POA: Diagnosis not present

## 2022-08-31 DIAGNOSIS — N186 End stage renal disease: Secondary | ICD-10-CM | POA: Diagnosis not present

## 2022-08-31 DIAGNOSIS — N2581 Secondary hyperparathyroidism of renal origin: Secondary | ICD-10-CM | POA: Diagnosis not present

## 2022-08-31 DIAGNOSIS — Z992 Dependence on renal dialysis: Secondary | ICD-10-CM | POA: Diagnosis not present

## 2022-09-02 DIAGNOSIS — N186 End stage renal disease: Secondary | ICD-10-CM | POA: Diagnosis not present

## 2022-09-02 DIAGNOSIS — Z992 Dependence on renal dialysis: Secondary | ICD-10-CM | POA: Diagnosis not present

## 2022-09-02 DIAGNOSIS — N2581 Secondary hyperparathyroidism of renal origin: Secondary | ICD-10-CM | POA: Diagnosis not present

## 2022-09-02 DIAGNOSIS — I129 Hypertensive chronic kidney disease with stage 1 through stage 4 chronic kidney disease, or unspecified chronic kidney disease: Secondary | ICD-10-CM | POA: Diagnosis not present

## 2022-09-04 DIAGNOSIS — N186 End stage renal disease: Secondary | ICD-10-CM | POA: Diagnosis not present

## 2022-09-04 DIAGNOSIS — Z992 Dependence on renal dialysis: Secondary | ICD-10-CM | POA: Diagnosis not present

## 2022-09-04 DIAGNOSIS — N2581 Secondary hyperparathyroidism of renal origin: Secondary | ICD-10-CM | POA: Diagnosis not present

## 2022-09-07 DIAGNOSIS — N186 End stage renal disease: Secondary | ICD-10-CM | POA: Diagnosis not present

## 2022-09-07 DIAGNOSIS — Z992 Dependence on renal dialysis: Secondary | ICD-10-CM | POA: Diagnosis not present

## 2022-09-07 DIAGNOSIS — N2581 Secondary hyperparathyroidism of renal origin: Secondary | ICD-10-CM | POA: Diagnosis not present

## 2022-09-09 DIAGNOSIS — N2581 Secondary hyperparathyroidism of renal origin: Secondary | ICD-10-CM | POA: Diagnosis not present

## 2022-09-09 DIAGNOSIS — Z992 Dependence on renal dialysis: Secondary | ICD-10-CM | POA: Diagnosis not present

## 2022-09-09 DIAGNOSIS — N186 End stage renal disease: Secondary | ICD-10-CM | POA: Diagnosis not present

## 2022-09-11 DIAGNOSIS — N186 End stage renal disease: Secondary | ICD-10-CM | POA: Diagnosis not present

## 2022-09-11 DIAGNOSIS — N2581 Secondary hyperparathyroidism of renal origin: Secondary | ICD-10-CM | POA: Diagnosis not present

## 2022-09-11 DIAGNOSIS — Z992 Dependence on renal dialysis: Secondary | ICD-10-CM | POA: Diagnosis not present

## 2022-09-14 DIAGNOSIS — N186 End stage renal disease: Secondary | ICD-10-CM | POA: Diagnosis not present

## 2022-09-14 DIAGNOSIS — N2581 Secondary hyperparathyroidism of renal origin: Secondary | ICD-10-CM | POA: Diagnosis not present

## 2022-09-14 DIAGNOSIS — Z992 Dependence on renal dialysis: Secondary | ICD-10-CM | POA: Diagnosis not present

## 2022-09-16 DIAGNOSIS — N2581 Secondary hyperparathyroidism of renal origin: Secondary | ICD-10-CM | POA: Diagnosis not present

## 2022-09-16 DIAGNOSIS — Z992 Dependence on renal dialysis: Secondary | ICD-10-CM | POA: Diagnosis not present

## 2022-09-16 DIAGNOSIS — N186 End stage renal disease: Secondary | ICD-10-CM | POA: Diagnosis not present

## 2022-09-18 DIAGNOSIS — N2581 Secondary hyperparathyroidism of renal origin: Secondary | ICD-10-CM | POA: Diagnosis not present

## 2022-09-18 DIAGNOSIS — Z992 Dependence on renal dialysis: Secondary | ICD-10-CM | POA: Diagnosis not present

## 2022-09-18 DIAGNOSIS — N186 End stage renal disease: Secondary | ICD-10-CM | POA: Diagnosis not present

## 2022-09-21 DIAGNOSIS — Z992 Dependence on renal dialysis: Secondary | ICD-10-CM | POA: Diagnosis not present

## 2022-09-21 DIAGNOSIS — N2581 Secondary hyperparathyroidism of renal origin: Secondary | ICD-10-CM | POA: Diagnosis not present

## 2022-09-21 DIAGNOSIS — N186 End stage renal disease: Secondary | ICD-10-CM | POA: Diagnosis not present

## 2022-09-23 DIAGNOSIS — Z992 Dependence on renal dialysis: Secondary | ICD-10-CM | POA: Diagnosis not present

## 2022-09-23 DIAGNOSIS — N2581 Secondary hyperparathyroidism of renal origin: Secondary | ICD-10-CM | POA: Diagnosis not present

## 2022-09-23 DIAGNOSIS — N186 End stage renal disease: Secondary | ICD-10-CM | POA: Diagnosis not present

## 2022-09-25 DIAGNOSIS — N2581 Secondary hyperparathyroidism of renal origin: Secondary | ICD-10-CM | POA: Diagnosis not present

## 2022-09-25 DIAGNOSIS — N186 End stage renal disease: Secondary | ICD-10-CM | POA: Diagnosis not present

## 2022-09-25 DIAGNOSIS — Z992 Dependence on renal dialysis: Secondary | ICD-10-CM | POA: Diagnosis not present

## 2022-09-28 DIAGNOSIS — Z992 Dependence on renal dialysis: Secondary | ICD-10-CM | POA: Diagnosis not present

## 2022-09-28 DIAGNOSIS — N186 End stage renal disease: Secondary | ICD-10-CM | POA: Diagnosis not present

## 2022-09-28 DIAGNOSIS — N2581 Secondary hyperparathyroidism of renal origin: Secondary | ICD-10-CM | POA: Diagnosis not present

## 2022-09-30 DIAGNOSIS — Z992 Dependence on renal dialysis: Secondary | ICD-10-CM | POA: Diagnosis not present

## 2022-09-30 DIAGNOSIS — N186 End stage renal disease: Secondary | ICD-10-CM | POA: Diagnosis not present

## 2022-09-30 DIAGNOSIS — N2581 Secondary hyperparathyroidism of renal origin: Secondary | ICD-10-CM | POA: Diagnosis not present

## 2022-10-01 DIAGNOSIS — N186 End stage renal disease: Secondary | ICD-10-CM | POA: Diagnosis not present

## 2022-10-01 DIAGNOSIS — I129 Hypertensive chronic kidney disease with stage 1 through stage 4 chronic kidney disease, or unspecified chronic kidney disease: Secondary | ICD-10-CM | POA: Diagnosis not present

## 2022-10-01 DIAGNOSIS — Z992 Dependence on renal dialysis: Secondary | ICD-10-CM | POA: Diagnosis not present

## 2022-10-02 DIAGNOSIS — N186 End stage renal disease: Secondary | ICD-10-CM | POA: Diagnosis not present

## 2022-10-02 DIAGNOSIS — N2581 Secondary hyperparathyroidism of renal origin: Secondary | ICD-10-CM | POA: Diagnosis not present

## 2022-10-02 DIAGNOSIS — Z992 Dependence on renal dialysis: Secondary | ICD-10-CM | POA: Diagnosis not present

## 2022-10-05 DIAGNOSIS — N186 End stage renal disease: Secondary | ICD-10-CM | POA: Diagnosis not present

## 2022-10-05 DIAGNOSIS — Z992 Dependence on renal dialysis: Secondary | ICD-10-CM | POA: Diagnosis not present

## 2022-10-05 DIAGNOSIS — N2581 Secondary hyperparathyroidism of renal origin: Secondary | ICD-10-CM | POA: Diagnosis not present

## 2022-10-07 DIAGNOSIS — N2581 Secondary hyperparathyroidism of renal origin: Secondary | ICD-10-CM | POA: Diagnosis not present

## 2022-10-07 DIAGNOSIS — Z992 Dependence on renal dialysis: Secondary | ICD-10-CM | POA: Diagnosis not present

## 2022-10-07 DIAGNOSIS — N186 End stage renal disease: Secondary | ICD-10-CM | POA: Diagnosis not present

## 2022-10-09 DIAGNOSIS — Z992 Dependence on renal dialysis: Secondary | ICD-10-CM | POA: Diagnosis not present

## 2022-10-09 DIAGNOSIS — N186 End stage renal disease: Secondary | ICD-10-CM | POA: Diagnosis not present

## 2022-10-09 DIAGNOSIS — N2581 Secondary hyperparathyroidism of renal origin: Secondary | ICD-10-CM | POA: Diagnosis not present

## 2022-10-12 DIAGNOSIS — Z992 Dependence on renal dialysis: Secondary | ICD-10-CM | POA: Diagnosis not present

## 2022-10-12 DIAGNOSIS — N2581 Secondary hyperparathyroidism of renal origin: Secondary | ICD-10-CM | POA: Diagnosis not present

## 2022-10-12 DIAGNOSIS — N186 End stage renal disease: Secondary | ICD-10-CM | POA: Diagnosis not present

## 2022-10-14 DIAGNOSIS — N2581 Secondary hyperparathyroidism of renal origin: Secondary | ICD-10-CM | POA: Diagnosis not present

## 2022-10-14 DIAGNOSIS — Z992 Dependence on renal dialysis: Secondary | ICD-10-CM | POA: Diagnosis not present

## 2022-10-14 DIAGNOSIS — N186 End stage renal disease: Secondary | ICD-10-CM | POA: Diagnosis not present

## 2022-10-16 DIAGNOSIS — N186 End stage renal disease: Secondary | ICD-10-CM | POA: Diagnosis not present

## 2022-10-16 DIAGNOSIS — Z992 Dependence on renal dialysis: Secondary | ICD-10-CM | POA: Diagnosis not present

## 2022-10-16 DIAGNOSIS — N2581 Secondary hyperparathyroidism of renal origin: Secondary | ICD-10-CM | POA: Diagnosis not present

## 2022-10-19 DIAGNOSIS — N186 End stage renal disease: Secondary | ICD-10-CM | POA: Diagnosis not present

## 2022-10-19 DIAGNOSIS — N2581 Secondary hyperparathyroidism of renal origin: Secondary | ICD-10-CM | POA: Diagnosis not present

## 2022-10-19 DIAGNOSIS — Z992 Dependence on renal dialysis: Secondary | ICD-10-CM | POA: Diagnosis not present

## 2022-10-21 DIAGNOSIS — Z992 Dependence on renal dialysis: Secondary | ICD-10-CM | POA: Diagnosis not present

## 2022-10-21 DIAGNOSIS — N186 End stage renal disease: Secondary | ICD-10-CM | POA: Diagnosis not present

## 2022-10-21 DIAGNOSIS — N2581 Secondary hyperparathyroidism of renal origin: Secondary | ICD-10-CM | POA: Diagnosis not present

## 2022-10-23 DIAGNOSIS — N2581 Secondary hyperparathyroidism of renal origin: Secondary | ICD-10-CM | POA: Diagnosis not present

## 2022-10-23 DIAGNOSIS — Z992 Dependence on renal dialysis: Secondary | ICD-10-CM | POA: Diagnosis not present

## 2022-10-23 DIAGNOSIS — N186 End stage renal disease: Secondary | ICD-10-CM | POA: Diagnosis not present

## 2022-10-26 DIAGNOSIS — N186 End stage renal disease: Secondary | ICD-10-CM | POA: Diagnosis not present

## 2022-10-26 DIAGNOSIS — Z992 Dependence on renal dialysis: Secondary | ICD-10-CM | POA: Diagnosis not present

## 2022-10-26 DIAGNOSIS — N2581 Secondary hyperparathyroidism of renal origin: Secondary | ICD-10-CM | POA: Diagnosis not present

## 2022-10-28 DIAGNOSIS — Z992 Dependence on renal dialysis: Secondary | ICD-10-CM | POA: Diagnosis not present

## 2022-10-28 DIAGNOSIS — N2581 Secondary hyperparathyroidism of renal origin: Secondary | ICD-10-CM | POA: Diagnosis not present

## 2022-10-28 DIAGNOSIS — N186 End stage renal disease: Secondary | ICD-10-CM | POA: Diagnosis not present

## 2022-10-30 DIAGNOSIS — N2581 Secondary hyperparathyroidism of renal origin: Secondary | ICD-10-CM | POA: Diagnosis not present

## 2022-10-30 DIAGNOSIS — Z992 Dependence on renal dialysis: Secondary | ICD-10-CM | POA: Diagnosis not present

## 2022-10-30 DIAGNOSIS — N186 End stage renal disease: Secondary | ICD-10-CM | POA: Diagnosis not present

## 2022-11-01 DIAGNOSIS — I129 Hypertensive chronic kidney disease with stage 1 through stage 4 chronic kidney disease, or unspecified chronic kidney disease: Secondary | ICD-10-CM | POA: Diagnosis not present

## 2022-11-01 DIAGNOSIS — N186 End stage renal disease: Secondary | ICD-10-CM | POA: Diagnosis not present

## 2022-11-01 DIAGNOSIS — Z992 Dependence on renal dialysis: Secondary | ICD-10-CM | POA: Diagnosis not present

## 2022-11-04 DIAGNOSIS — N186 End stage renal disease: Secondary | ICD-10-CM | POA: Diagnosis not present

## 2022-11-04 DIAGNOSIS — N2581 Secondary hyperparathyroidism of renal origin: Secondary | ICD-10-CM | POA: Diagnosis not present

## 2022-11-04 DIAGNOSIS — Z992 Dependence on renal dialysis: Secondary | ICD-10-CM | POA: Diagnosis not present

## 2022-11-05 DIAGNOSIS — N186 End stage renal disease: Secondary | ICD-10-CM | POA: Diagnosis not present

## 2022-11-05 DIAGNOSIS — N2581 Secondary hyperparathyroidism of renal origin: Secondary | ICD-10-CM | POA: Diagnosis not present

## 2022-11-05 DIAGNOSIS — Z992 Dependence on renal dialysis: Secondary | ICD-10-CM | POA: Diagnosis not present

## 2022-11-07 DIAGNOSIS — Z992 Dependence on renal dialysis: Secondary | ICD-10-CM | POA: Diagnosis not present

## 2022-11-07 DIAGNOSIS — N186 End stage renal disease: Secondary | ICD-10-CM | POA: Diagnosis not present

## 2022-11-07 DIAGNOSIS — N2581 Secondary hyperparathyroidism of renal origin: Secondary | ICD-10-CM | POA: Diagnosis not present

## 2022-11-09 DIAGNOSIS — N186 End stage renal disease: Secondary | ICD-10-CM | POA: Diagnosis not present

## 2022-11-09 DIAGNOSIS — Z992 Dependence on renal dialysis: Secondary | ICD-10-CM | POA: Diagnosis not present

## 2022-11-09 DIAGNOSIS — N2581 Secondary hyperparathyroidism of renal origin: Secondary | ICD-10-CM | POA: Diagnosis not present

## 2022-11-10 DIAGNOSIS — I871 Compression of vein: Secondary | ICD-10-CM | POA: Diagnosis not present

## 2022-11-10 DIAGNOSIS — N186 End stage renal disease: Secondary | ICD-10-CM | POA: Diagnosis not present

## 2022-11-10 DIAGNOSIS — T82858A Stenosis of vascular prosthetic devices, implants and grafts, initial encounter: Secondary | ICD-10-CM | POA: Diagnosis not present

## 2022-11-10 DIAGNOSIS — Z992 Dependence on renal dialysis: Secondary | ICD-10-CM | POA: Diagnosis not present

## 2022-11-13 DIAGNOSIS — N186 End stage renal disease: Secondary | ICD-10-CM | POA: Diagnosis not present

## 2022-11-13 DIAGNOSIS — Z992 Dependence on renal dialysis: Secondary | ICD-10-CM | POA: Diagnosis not present

## 2022-11-13 DIAGNOSIS — N2581 Secondary hyperparathyroidism of renal origin: Secondary | ICD-10-CM | POA: Diagnosis not present

## 2022-11-16 DIAGNOSIS — I871 Compression of vein: Secondary | ICD-10-CM | POA: Diagnosis not present

## 2022-11-16 DIAGNOSIS — T82858A Stenosis of vascular prosthetic devices, implants and grafts, initial encounter: Secondary | ICD-10-CM | POA: Diagnosis not present

## 2022-11-16 DIAGNOSIS — Z992 Dependence on renal dialysis: Secondary | ICD-10-CM | POA: Diagnosis not present

## 2022-11-16 DIAGNOSIS — N186 End stage renal disease: Secondary | ICD-10-CM | POA: Diagnosis not present

## 2022-11-17 DIAGNOSIS — Z992 Dependence on renal dialysis: Secondary | ICD-10-CM | POA: Diagnosis not present

## 2022-11-17 DIAGNOSIS — N186 End stage renal disease: Secondary | ICD-10-CM | POA: Diagnosis not present

## 2022-11-17 DIAGNOSIS — N2581 Secondary hyperparathyroidism of renal origin: Secondary | ICD-10-CM | POA: Diagnosis not present

## 2022-11-18 DIAGNOSIS — N2581 Secondary hyperparathyroidism of renal origin: Secondary | ICD-10-CM | POA: Diagnosis not present

## 2022-11-18 DIAGNOSIS — Z992 Dependence on renal dialysis: Secondary | ICD-10-CM | POA: Diagnosis not present

## 2022-11-18 DIAGNOSIS — N186 End stage renal disease: Secondary | ICD-10-CM | POA: Diagnosis not present

## 2022-11-20 DIAGNOSIS — N186 End stage renal disease: Secondary | ICD-10-CM | POA: Diagnosis not present

## 2022-11-20 DIAGNOSIS — Z992 Dependence on renal dialysis: Secondary | ICD-10-CM | POA: Diagnosis not present

## 2022-11-20 DIAGNOSIS — N2581 Secondary hyperparathyroidism of renal origin: Secondary | ICD-10-CM | POA: Diagnosis not present

## 2022-11-23 DIAGNOSIS — N2581 Secondary hyperparathyroidism of renal origin: Secondary | ICD-10-CM | POA: Diagnosis not present

## 2022-11-23 DIAGNOSIS — N186 End stage renal disease: Secondary | ICD-10-CM | POA: Diagnosis not present

## 2022-11-23 DIAGNOSIS — Z992 Dependence on renal dialysis: Secondary | ICD-10-CM | POA: Diagnosis not present

## 2022-11-25 DIAGNOSIS — Z992 Dependence on renal dialysis: Secondary | ICD-10-CM | POA: Diagnosis not present

## 2022-11-25 DIAGNOSIS — N2581 Secondary hyperparathyroidism of renal origin: Secondary | ICD-10-CM | POA: Diagnosis not present

## 2022-11-25 DIAGNOSIS — N186 End stage renal disease: Secondary | ICD-10-CM | POA: Diagnosis not present

## 2022-11-27 DIAGNOSIS — N2581 Secondary hyperparathyroidism of renal origin: Secondary | ICD-10-CM | POA: Diagnosis not present

## 2022-11-27 DIAGNOSIS — Z992 Dependence on renal dialysis: Secondary | ICD-10-CM | POA: Diagnosis not present

## 2022-11-27 DIAGNOSIS — N186 End stage renal disease: Secondary | ICD-10-CM | POA: Diagnosis not present

## 2022-11-30 DIAGNOSIS — Z992 Dependence on renal dialysis: Secondary | ICD-10-CM | POA: Diagnosis not present

## 2022-11-30 DIAGNOSIS — N2581 Secondary hyperparathyroidism of renal origin: Secondary | ICD-10-CM | POA: Diagnosis not present

## 2022-11-30 DIAGNOSIS — M1711 Unilateral primary osteoarthritis, right knee: Secondary | ICD-10-CM | POA: Diagnosis not present

## 2022-11-30 DIAGNOSIS — N186 End stage renal disease: Secondary | ICD-10-CM | POA: Diagnosis not present

## 2022-11-30 DIAGNOSIS — M25561 Pain in right knee: Secondary | ICD-10-CM | POA: Diagnosis not present

## 2022-12-01 DIAGNOSIS — I129 Hypertensive chronic kidney disease with stage 1 through stage 4 chronic kidney disease, or unspecified chronic kidney disease: Secondary | ICD-10-CM | POA: Diagnosis not present

## 2022-12-01 DIAGNOSIS — Z992 Dependence on renal dialysis: Secondary | ICD-10-CM | POA: Diagnosis not present

## 2022-12-01 DIAGNOSIS — N186 End stage renal disease: Secondary | ICD-10-CM | POA: Diagnosis not present

## 2022-12-02 DIAGNOSIS — Z992 Dependence on renal dialysis: Secondary | ICD-10-CM | POA: Diagnosis not present

## 2022-12-02 DIAGNOSIS — N186 End stage renal disease: Secondary | ICD-10-CM | POA: Diagnosis not present

## 2022-12-02 DIAGNOSIS — N2581 Secondary hyperparathyroidism of renal origin: Secondary | ICD-10-CM | POA: Diagnosis not present

## 2022-12-04 DIAGNOSIS — N2581 Secondary hyperparathyroidism of renal origin: Secondary | ICD-10-CM | POA: Diagnosis not present

## 2022-12-04 DIAGNOSIS — N186 End stage renal disease: Secondary | ICD-10-CM | POA: Diagnosis not present

## 2022-12-04 DIAGNOSIS — Z992 Dependence on renal dialysis: Secondary | ICD-10-CM | POA: Diagnosis not present

## 2022-12-07 DIAGNOSIS — N2581 Secondary hyperparathyroidism of renal origin: Secondary | ICD-10-CM | POA: Diagnosis not present

## 2022-12-07 DIAGNOSIS — N186 End stage renal disease: Secondary | ICD-10-CM | POA: Diagnosis not present

## 2022-12-07 DIAGNOSIS — Z992 Dependence on renal dialysis: Secondary | ICD-10-CM | POA: Diagnosis not present

## 2022-12-09 DIAGNOSIS — N2581 Secondary hyperparathyroidism of renal origin: Secondary | ICD-10-CM | POA: Diagnosis not present

## 2022-12-09 DIAGNOSIS — Z992 Dependence on renal dialysis: Secondary | ICD-10-CM | POA: Diagnosis not present

## 2022-12-09 DIAGNOSIS — N186 End stage renal disease: Secondary | ICD-10-CM | POA: Diagnosis not present

## 2022-12-11 DIAGNOSIS — N186 End stage renal disease: Secondary | ICD-10-CM | POA: Diagnosis not present

## 2022-12-11 DIAGNOSIS — Z992 Dependence on renal dialysis: Secondary | ICD-10-CM | POA: Diagnosis not present

## 2022-12-11 DIAGNOSIS — N2581 Secondary hyperparathyroidism of renal origin: Secondary | ICD-10-CM | POA: Diagnosis not present

## 2022-12-14 DIAGNOSIS — N186 End stage renal disease: Secondary | ICD-10-CM | POA: Diagnosis not present

## 2022-12-14 DIAGNOSIS — N2581 Secondary hyperparathyroidism of renal origin: Secondary | ICD-10-CM | POA: Diagnosis not present

## 2022-12-14 DIAGNOSIS — Z992 Dependence on renal dialysis: Secondary | ICD-10-CM | POA: Diagnosis not present

## 2022-12-16 ENCOUNTER — Ambulatory Visit (INDEPENDENT_AMBULATORY_CARE_PROVIDER_SITE_OTHER): Payer: Medicare HMO | Admitting: Physician Assistant

## 2022-12-16 ENCOUNTER — Encounter: Payer: Self-pay | Admitting: Physician Assistant

## 2022-12-16 VITALS — BP 119/58 | HR 73 | Wt 232.9 lb

## 2022-12-16 DIAGNOSIS — N186 End stage renal disease: Secondary | ICD-10-CM | POA: Diagnosis not present

## 2022-12-16 DIAGNOSIS — Z992 Dependence on renal dialysis: Secondary | ICD-10-CM | POA: Diagnosis not present

## 2022-12-16 DIAGNOSIS — R319 Hematuria, unspecified: Secondary | ICD-10-CM | POA: Diagnosis not present

## 2022-12-16 DIAGNOSIS — E1159 Type 2 diabetes mellitus with other circulatory complications: Secondary | ICD-10-CM

## 2022-12-16 DIAGNOSIS — N3001 Acute cystitis with hematuria: Secondary | ICD-10-CM | POA: Diagnosis not present

## 2022-12-16 DIAGNOSIS — N2581 Secondary hyperparathyroidism of renal origin: Secondary | ICD-10-CM | POA: Diagnosis not present

## 2022-12-16 LAB — POCT URINALYSIS DIPSTICK
Bilirubin, UA: NEGATIVE
Glucose, UA: POSITIVE — AB
Ketones, UA: NEGATIVE
Nitrite, UA: NEGATIVE
Protein, UA: POSITIVE — AB
Spec Grav, UA: 1.01 (ref 1.010–1.025)
Urobilinogen, UA: 0.2 E.U./dL
pH, UA: 6.5 (ref 5.0–8.0)

## 2022-12-16 MED ORDER — CIPROFLOXACIN HCL 500 MG PO TABS: 500.0000 mg | ORAL_TABLET | Freq: Every day | ORAL | 0 refills | Status: AC

## 2022-12-16 NOTE — Progress Notes (Signed)
I,Jeremy Dorsey,acting as a Neurosurgeon for [employer], PA-C.,have documented all relevant documentation on the behalf of Jeremy Ferguson, PA-C,as directed by  Jeremy Ferguson, PA-C while in the presence of Jeremy Ferguson, PA-C.   Established patient visit   Patient: Jeremy Dorsey   DOB: Jul 29, 1951   72 y.o. Male  MRN: [id number] Visit Date: 12/16/2022  Today's healthcare provider: [name], PA-C   Cc. Hematuria x 2 days  Subjective     Pt reports his last dialysis was Monday, that day he urinated and it was bright red. Later that day with blood clots. Reports Tuesday was clear, and then later that day was a little pink.   Pt reports he is unable to urinate in office, that he only urinates around 3 times a day.   Denies pain. Pt reports this happened before and it was 'a cyst that popped'.  Home dialysis. M -W - F. Next appt with nephro 12/30/22 Medications: Outpatient Medications Prior to Visit  Medication Sig   aspirin 81 MG EC tablet Take 81 mg by mouth in the morning. Swallow whole.   atorvastatin (LIPITOR) 80 MG tablet Take 1 tablet (80 mg total) by mouth daily.   AURYXIA 1 GM 210 MG(Fe) tablet Take by mouth.   calcitRIOL (ROCALTROL) 0.25 MCG capsule Take 0.25 mcg by mouth in the morning.   clopidogrel (PLAVIX) 75 MG tablet Take 1 tablet (75 mg total) by mouth daily.   lidocaine-prilocaine (EMLA) cream Apply 1 Application topically daily as needed (prior to port being accessed.).   multivitamin (RENA-VIT) TABS tablet Take 1 tablet by mouth daily.   nitroGLYCERIN (NITROSTAT) 0.4 MG SL tablet Place 1 tablet (0.4 mg total) under the tongue every 5 (five) minutes x 3 doses as needed for chest pain.   sevelamer carbonate (RENVELA) 800 MG tablet Take 800 mg by mouth with breakfast, with lunch, and with evening meal. (Patient not taking: Reported on 07/14/2022)   torsemide (DEMADEX) 100 MG tablet Take 100 mg by mouth every morning.   Facility-Administered Medications  Prior to Visit  Medication Dose Route Frequency Provider   0.9 %  sodium chloride infusion  250 mL Intravenous PRN Cephus Shelling, MD   0.9 %  sodium chloride infusion  250 mL Intravenous PRN Cephus Shelling, MD   sodium chloride flush (NS) 0.9 % injection 3 mL  3 mL Intravenous Q12H Cephus Shelling, MD   sodium chloride flush (NS) 0.9 % injection 3 mL  3 mL Intravenous PRN Cephus Shelling, MD   sodium chloride flush (NS) 0.9 % injection 3 mL  3 mL Intravenous Q12H Cephus Shelling, MD   sodium chloride flush (NS) 0.9 % injection 3 mL  3 mL Intravenous PRN Cephus Shelling, MD    Review of Systems  Genitourinary:  Positive for hematuria.      Objective    BP (!) 119/58 (BP Location: Right Arm, Patient Position: Sitting, Cuff Size: Normal)   Pulse 73   Wt 232 lb 14.4 oz (105.6 kg)   SpO2 98%   BMI 33.42 kg/m    Physical Exam Vitals reviewed.  Constitutional:      Appearance: He is not ill-appearing.  HENT:     Head: Normocephalic.  Eyes:     Conjunctiva/sclera: Conjunctivae normal.  Cardiovascular:     Rate and Rhythm: Normal rate.  Pulmonary:     Effort: Pulmonary effort is normal. No respiratory distress.  Neurological:  General: No focal deficit present.     Mental Status: He is alert and oriented to person, place, and time.  Psychiatric:        Mood and Affect: Mood normal.        Behavior: Behavior normal.      No results found for any visits on 12/16/22.  Assessment & Plan     1. Hematuria, unspecified type - POCT Urinalysis Dipstick - Urinalysis, microscopic only  2. Acute cystitis with hematuria UA + leuk, blood. Ordering culture, urine micro Tx w/ cipro 500 mg daily, after dialysis.  - ciprofloxacin (CIPRO) 500 MG tablet; Take 1 tablet (500 mg total) by mouth daily for 5 days. On dialysis days take AFTER dialysis.  Dispense: 5 tablet; Refill: 0 - POCT Urinalysis Dipstick - Urine Culture   Return if symptoms worsen  or fail to improve.      I, Jeremy Ferguson, PA-C have reviewed all documentation for this visit. The documentation on  12/16/22   for the exam, diagnosis, procedures, and orders are all accurate and complete.  Jeremy Ferguson, PA-C [facility] [address] [place], [place], [address] Office: [phone] Fax: [phone]   [facility]

## 2022-12-17 ENCOUNTER — Encounter: Payer: Self-pay | Admitting: Physician Assistant

## 2022-12-17 LAB — SPECIMEN STATUS REPORT

## 2022-12-17 LAB — MICROALBUMIN / CREATININE URINE RATIO
Creatinine, Urine: 75.8 mg/dL
Microalb/Creat Ratio: 1325 mg/g creat — ABNORMAL HIGH (ref 0–29)
Microalbumin, Urine: 1004.6 ug/mL

## 2022-12-17 LAB — URINALYSIS, MICROSCOPIC ONLY
Bacteria, UA: NONE SEEN
Casts: NONE SEEN /lpf
Epithelial Cells (non renal): NONE SEEN /hpf (ref 0–10)
WBC, UA: >30 /hpf — AB (ref 0–5)

## 2022-12-18 DIAGNOSIS — N2581 Secondary hyperparathyroidism of renal origin: Secondary | ICD-10-CM | POA: Diagnosis not present

## 2022-12-18 DIAGNOSIS — Z992 Dependence on renal dialysis: Secondary | ICD-10-CM | POA: Diagnosis not present

## 2022-12-18 DIAGNOSIS — N186 End stage renal disease: Secondary | ICD-10-CM | POA: Diagnosis not present

## 2022-12-18 LAB — URINE CULTURE: Organism ID, Bacteria: NO GROWTH

## 2022-12-18 LAB — SPECIMEN STATUS REPORT

## 2022-12-21 DIAGNOSIS — N2581 Secondary hyperparathyroidism of renal origin: Secondary | ICD-10-CM | POA: Diagnosis not present

## 2022-12-21 DIAGNOSIS — N186 End stage renal disease: Secondary | ICD-10-CM | POA: Diagnosis not present

## 2022-12-21 DIAGNOSIS — Z992 Dependence on renal dialysis: Secondary | ICD-10-CM | POA: Diagnosis not present

## 2022-12-23 ENCOUNTER — Emergency Department
Admission: EM | Admit: 2022-12-23 | Discharge: 2022-12-23 | Discharge status: Against Medical Advice | Payer: Medicare HMO | Attending: Emergency Medicine | Admitting: Emergency Medicine

## 2022-12-23 ENCOUNTER — Other Ambulatory Visit: Payer: Self-pay

## 2022-12-23 ENCOUNTER — Encounter: Payer: Self-pay | Admitting: Emergency Medicine

## 2022-12-23 DIAGNOSIS — Z7901 Long term (current) use of anticoagulants: Secondary | ICD-10-CM | POA: Insufficient documentation

## 2022-12-23 DIAGNOSIS — N2581 Secondary hyperparathyroidism of renal origin: Secondary | ICD-10-CM | POA: Diagnosis not present

## 2022-12-23 DIAGNOSIS — R82998 Other abnormal findings in urine: Secondary | ICD-10-CM | POA: Insufficient documentation

## 2022-12-23 DIAGNOSIS — Z5321 Procedure and treatment not carried out due to patient leaving prior to being seen by health care provider: Secondary | ICD-10-CM | POA: Diagnosis not present

## 2022-12-23 DIAGNOSIS — N186 End stage renal disease: Secondary | ICD-10-CM | POA: Diagnosis not present

## 2022-12-23 DIAGNOSIS — Z992 Dependence on renal dialysis: Secondary | ICD-10-CM | POA: Insufficient documentation

## 2022-12-23 DIAGNOSIS — R319 Hematuria, unspecified: Secondary | ICD-10-CM | POA: Diagnosis not present

## 2022-12-23 LAB — URINALYSIS, ROUTINE W REFLEX MICROSCOPIC
RBC / HPF: >50 RBC/hpf (ref 0–5)
Specific Gravity, Urine: 1.013 (ref 1.005–1.030)
Squamous Epithelial / HPF: NONE SEEN /HPF (ref 0–5)
WBC, UA: >50 WBC/hpf (ref 0–5)

## 2022-12-23 LAB — BASIC METABOLIC PANEL
Anion gap: 13 (ref 5–15)
BUN: 61 mg/dL — ABNORMAL HIGH (ref 8–23)
CO2: 23 mmol/L (ref 22–32)
Calcium: 9.7 mg/dL (ref 8.9–10.3)
Chloride: 100 mmol/L (ref 98–111)
Creatinine, Ser: 8.07 mg/dL — ABNORMAL HIGH (ref 0.61–1.24)
GFR, Estimated: 7 mL/min — ABNORMAL LOW (ref >60)
Glucose, Bld: 262 mg/dL — ABNORMAL HIGH (ref 70–99)
Potassium: 3.8 mmol/L (ref 3.5–5.1)
Sodium: 136 mmol/L (ref 135–145)

## 2022-12-23 LAB — CBC
HCT: 28.1 % — ABNORMAL LOW (ref 39.0–52.0)
Hemoglobin: 9.4 g/dL — ABNORMAL LOW (ref 13.0–17.0)
MCH: 32.2 pg (ref 26.0–34.0)
MCHC: 33.5 g/dL (ref 30.0–36.0)
MCV: 96.2 fL (ref 80.0–100.0)
Platelets: 166 10*3/uL (ref 150–400)
RBC: 2.92 MIL/uL — ABNORMAL LOW (ref 4.22–5.81)
RDW: 12.6 % (ref 11.5–15.5)
WBC: 6.7 10*3/uL (ref 4.0–10.5)
nRBC: 0 % (ref 0.0–0.2)

## 2022-12-23 NOTE — ED Triage Notes (Signed)
Patient to ED via POV for blood in urine x2 days. States dark red in color. On blood thinners. Denies pain. Currently on dialysis at home.

## 2022-12-24 ENCOUNTER — Telehealth: Payer: Self-pay | Admitting: *Deleted

## 2022-12-24 NOTE — Telephone Encounter (Signed)
Patient's wife calling stating that pt is bleeding with urination and wanted to know what to do. I explained to wife that he is a new patient, last seen 2020 and that I can't advise on his care until he is our patient again. Wife states they went to the ER and after 4 hours they left AMA.  I advised that we have no available new patient appts soon. Pt is scheduled for 01/12/23. Wife states that we are going to let her husband die, I explained again he would need to be seen at the ER and maybe urology could round on him then, or maybe his PCP could offer some help. Wife disconnected the call.

## 2022-12-25 DIAGNOSIS — N2581 Secondary hyperparathyroidism of renal origin: Secondary | ICD-10-CM | POA: Diagnosis not present

## 2022-12-25 DIAGNOSIS — Z992 Dependence on renal dialysis: Secondary | ICD-10-CM | POA: Diagnosis not present

## 2022-12-25 DIAGNOSIS — N186 End stage renal disease: Secondary | ICD-10-CM | POA: Diagnosis not present

## 2022-12-28 DIAGNOSIS — Z992 Dependence on renal dialysis: Secondary | ICD-10-CM | POA: Diagnosis not present

## 2022-12-28 DIAGNOSIS — N186 End stage renal disease: Secondary | ICD-10-CM | POA: Diagnosis not present

## 2022-12-28 DIAGNOSIS — N2581 Secondary hyperparathyroidism of renal origin: Secondary | ICD-10-CM | POA: Diagnosis not present

## 2022-12-30 DIAGNOSIS — Z992 Dependence on renal dialysis: Secondary | ICD-10-CM | POA: Diagnosis not present

## 2022-12-30 DIAGNOSIS — N2581 Secondary hyperparathyroidism of renal origin: Secondary | ICD-10-CM | POA: Diagnosis not present

## 2022-12-30 DIAGNOSIS — N186 End stage renal disease: Secondary | ICD-10-CM | POA: Diagnosis not present

## 2023-01-01 DIAGNOSIS — N186 End stage renal disease: Secondary | ICD-10-CM | POA: Diagnosis not present

## 2023-01-01 DIAGNOSIS — N2581 Secondary hyperparathyroidism of renal origin: Secondary | ICD-10-CM | POA: Diagnosis not present

## 2023-01-01 DIAGNOSIS — I129 Hypertensive chronic kidney disease with stage 1 through stage 4 chronic kidney disease, or unspecified chronic kidney disease: Secondary | ICD-10-CM | POA: Diagnosis not present

## 2023-01-01 DIAGNOSIS — Z992 Dependence on renal dialysis: Secondary | ICD-10-CM | POA: Diagnosis not present

## 2023-01-04 DIAGNOSIS — N186 End stage renal disease: Secondary | ICD-10-CM | POA: Diagnosis not present

## 2023-01-04 DIAGNOSIS — E113393 Type 2 diabetes mellitus with moderate nonproliferative diabetic retinopathy without macular edema, bilateral: Secondary | ICD-10-CM | POA: Diagnosis not present

## 2023-01-04 DIAGNOSIS — H524 Presbyopia: Secondary | ICD-10-CM | POA: Diagnosis not present

## 2023-01-04 DIAGNOSIS — Z992 Dependence on renal dialysis: Secondary | ICD-10-CM | POA: Diagnosis not present

## 2023-01-04 DIAGNOSIS — N2581 Secondary hyperparathyroidism of renal origin: Secondary | ICD-10-CM | POA: Diagnosis not present

## 2023-01-05 ENCOUNTER — Encounter (INDEPENDENT_AMBULATORY_CARE_PROVIDER_SITE_OTHER): Payer: Medicare HMO | Admitting: Ophthalmology

## 2023-01-05 DIAGNOSIS — I10 Essential (primary) hypertension: Secondary | ICD-10-CM

## 2023-01-05 DIAGNOSIS — H35033 Hypertensive retinopathy, bilateral: Secondary | ICD-10-CM | POA: Diagnosis not present

## 2023-01-05 DIAGNOSIS — H43813 Vitreous degeneration, bilateral: Secondary | ICD-10-CM | POA: Diagnosis not present

## 2023-01-05 DIAGNOSIS — E113393 Type 2 diabetes mellitus with moderate nonproliferative diabetic retinopathy without macular edema, bilateral: Secondary | ICD-10-CM

## 2023-01-05 DIAGNOSIS — H348312 Tributary (branch) retinal vein occlusion, right eye, stable: Secondary | ICD-10-CM

## 2023-01-05 DIAGNOSIS — Z7984 Long term (current) use of oral hypoglycemic drugs: Secondary | ICD-10-CM | POA: Diagnosis not present

## 2023-01-06 DIAGNOSIS — Z992 Dependence on renal dialysis: Secondary | ICD-10-CM | POA: Diagnosis not present

## 2023-01-06 DIAGNOSIS — N186 End stage renal disease: Secondary | ICD-10-CM | POA: Diagnosis not present

## 2023-01-06 DIAGNOSIS — N2581 Secondary hyperparathyroidism of renal origin: Secondary | ICD-10-CM | POA: Diagnosis not present

## 2023-01-08 DIAGNOSIS — N2581 Secondary hyperparathyroidism of renal origin: Secondary | ICD-10-CM | POA: Diagnosis not present

## 2023-01-08 DIAGNOSIS — Z992 Dependence on renal dialysis: Secondary | ICD-10-CM | POA: Diagnosis not present

## 2023-01-08 DIAGNOSIS — N186 End stage renal disease: Secondary | ICD-10-CM | POA: Diagnosis not present

## 2023-01-11 ENCOUNTER — Other Ambulatory Visit: Payer: Self-pay

## 2023-01-11 DIAGNOSIS — N2581 Secondary hyperparathyroidism of renal origin: Secondary | ICD-10-CM | POA: Diagnosis not present

## 2023-01-11 DIAGNOSIS — Z992 Dependence on renal dialysis: Secondary | ICD-10-CM | POA: Diagnosis not present

## 2023-01-11 DIAGNOSIS — R319 Hematuria, unspecified: Secondary | ICD-10-CM

## 2023-01-11 DIAGNOSIS — N186 End stage renal disease: Secondary | ICD-10-CM | POA: Diagnosis not present

## 2023-01-12 ENCOUNTER — Encounter: Payer: Self-pay | Admitting: Urology

## 2023-01-12 ENCOUNTER — Telehealth: Payer: Self-pay

## 2023-01-12 ENCOUNTER — Ambulatory Visit: Payer: Medicare HMO | Admitting: Urology

## 2023-01-12 VITALS — BP 129/67 | HR 78 | Ht 70.0 in | Wt 229.0 lb

## 2023-01-12 DIAGNOSIS — Z992 Dependence on renal dialysis: Secondary | ICD-10-CM | POA: Diagnosis not present

## 2023-01-12 DIAGNOSIS — N186 End stage renal disease: Secondary | ICD-10-CM

## 2023-01-12 DIAGNOSIS — R310 Gross hematuria: Secondary | ICD-10-CM | POA: Diagnosis not present

## 2023-01-12 NOTE — Progress Notes (Signed)
01/12/23 8:55 AM   Jeremy Dorsey 11-Sep-1950 [id number]  CC: Gross hematuria, urinary urgency  HPI: 72 year old male with ESRD on dialysis Monday Wednesday Friday who presents with gross hematuria.  About a month ago he had some pink urine, then in mid February he reports 4 days of painless severe gross hematuria.  I actually saw him in 2019 and 2020 when he presented with severe dysuria and was found to have mild right hydronephrosis, cystoscopy on 08/01/2018 showed bullous and papillary changes surrounding the right ureteral orifice concerning for tumor and this was resected entirely, pathology showed eosinophilic cystitis.  He never followed up.  He thinks his urinary urgency improved temporarily after that surgery, but has returned.  He voids 3-4 times per day and once overnight.  He did not tolerate Foley catheter well at that time.  At the time of his significant hematuria he was seen in the ER and urinalysis showed greater than 50 RBC, greater than 50 WBC, many bacteria, WBC clumps, this was not sent for culture, and he left prior to being evaluated by ER staff.  He also had a urine culture on 12/16/2022 when he originally had the pink urine, and this showed no growth.  Most recent cross-sectional imaging is a CT abdomen and pelvis without contrast from May 2022 that was benign from [facility].  He was unable to void for urinalysis today.  PMH: Past Medical History:  Diagnosis Date   Anemia    Cancer ([facility])    basal cell nose and forehead   CKD (chronic kidney disease), stage IV (HCC)    Coronary artery disease    a. s/p IVUS-guided DESx2 to prox & mid LAD, residual disease treated medically. EF 50-55% by recent echo 02/2020.   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   Urinary complication     Surgical  History: Past Surgical History:  Procedure Laterality Date   ABDOMINAL AORTOGRAM W/LOWER EXTREMITY N/A 01/29/2022   Procedure: ABDOMINAL AORTOGRAM W/ Bilateral LOWER EXTREMITY Runoff;  Surgeon: Cephus Shelling, MD;  [facility];  Service: Cardiovascular;  Laterality: N/A;   ABDOMINAL AORTOGRAM W/LOWER EXTREMITY N/A 06/11/2022   Procedure: ABDOMINAL AORTOGRAM W/LOWER EXTREMITY;  Surgeon: Cephus Shelling, MD;  [facility]: [facility];  Service: Cardiovascular;  Laterality: N/A;   AV FISTULA PLACEMENT Left 10/31/2020   Procedure: LEFT UPPER EXTREMITY ARTERIOVENOUS (AV) FISTULA CREATION;  Surgeon: Maeola Harman, MD;  Location: [facility];  Service: Vascular;  Laterality: Left;   AV FISTULA PLACEMENT Left 07/09/2021   Procedure: LEFT ARM ARTERIOVENOUS (AV) FISTULA CREATION;  Surgeon: Nada Libman, MD;  Location: [facility];  Service: Vascular;  Laterality: Left;   COLONOSCOPY  1990   COLONOSCOPY  06/09/2011   Dr Lemar Livings   CORONARY STENT INTERVENTION N/A 04/24/2020   Procedure: CORONARY STENT INTERVENTION;  Surgeon: Iran Ouch, MD;  [facility];  Service: Cardiovascular;  Laterality: N/A;   CORONARY ULTRASOUND/IVUS N/A 04/24/2020   Procedure: Intravascular Ultrasound/IVUS;  Surgeon: Iran Ouch, MD;  [facility];  Service: Cardiovascular;  Laterality: N/A;   CYSTOSCOPY W/ RETROGRADES Bilateral 08/01/2018   Procedure: CYSTOSCOPY WITH RETROGRADE PYELOGRAM;  Surgeon: Sondra Come, MD;  Location: [facility];  Service: Urology;  Laterality: Bilateral;   CYSTOSCOPY WITH BIOPSY N/A 08/01/2018   Procedure: CYSTOSCOPY WITH Bladder BIOPSY;  Surgeon: Sondra Come, MD;  Location: [facility];  Service: Urology;  Laterality: N/A;   CYSTOSCOPY WITH STENT PLACEMENT Right 08/01/2018   Procedure: CYSTOSCOPY WITH STENT PLACEMENT;  Surgeon: Sondra Come, MD;  Location: [facility];  Service: Urology;  Laterality: Right;   EYE SURGERY  Right    laser surgery   LEFT HEART CATH AND CORONARY ANGIOGRAPHY N/A 04/24/2020   Procedure: LEFT HEART CATH AND CORONARY ANGIOGRAPHY;  Surgeon: Iran Ouch, MD;  [facility];  Service: Cardiovascular;  Laterality: N/A;   PERIPHERAL VASCULAR BALLOON ANGIOPLASTY Right 01/29/2022   Procedure: PERIPHERAL VASCULAR BALLOON ANGIOPLASTY;  Surgeon: Cephus Shelling, MD;  [facility];  Service: Cardiovascular;  Laterality: Right;   PERIPHERAL VASCULAR BALLOON ANGIOPLASTY Right 06/11/2022   Procedure: PERIPHERAL VASCULAR BALLOON ANGIOPLASTY;  Surgeon: Cephus Shelling, MD;  [facility];  Service: Cardiovascular;  Laterality: Right;  Peroneal   SHOULDER ARTHROSCOPY WITH OPEN ROTATOR CUFF REPAIR Right 08/25/2018   Procedure: SHOULDER ARTHROSCOPY WITH MINI OPEN ROTATOR CUFF REPAIR;  Surgeon: Juanell Fairly, MD;  Location: [facility];  Service: Orthopedics;  Laterality: Right;   TRANSURETHRAL RESECTION OF BLADDER TUMOR N/A 08/01/2018   Procedure: TRANSURETHRAL RESECTION OF BLADDER TUMOR (TURBT);  Surgeon: Sondra Come, MD;  Location: [facility];  Service: Urology;  Laterality: N/A;   URETEROSCOPY WITH HOLMIUM LASER LITHOTRIPSY Right 08/01/2018   Procedure: URETEROSCOPY WITH HOLMIUM LASER LITHOTRIPSY;  Surgeon: Sondra Come, MD;  Location: [facility];  Service: Urology;  Laterality: Right;    Family History: Family History  Problem Relation Age of Onset   Psoriasis Mother    Heart failure Father     Social History:  reports that he quit smoking about 15 years ago. His smoking use included cigarettes. He has a 30.00 pack-year smoking history. He has never used smokeless tobacco. He reports that he does not drink alcohol and does not use drugs.  Physical Exam: BP 129/67 (BP Location: Right Arm, Patient Position: Sitting, Cuff Size: Large)   Pulse 78   Ht 5\' 10"  (1.778 m)   Wt 229 lb (103.9 kg)   BMI 32.86 kg/m    Constitutional:  Alert  and oriented, No acute distress. Cardiovascular: No clubbing, cyanosis, or edema. Respiratory: Normal respiratory effort, no increased work of breathing. GI: Abdomen is soft, nontender, nondistended, no abdominal masses   Assessment & Plan:   72 year old male with ESRD on dialysis Monday Wednesday Friday who presents with painless gross hematuria.  He has a history of biopsy-proven eosinophilic cystitis of the bladder in 2019, but never followed up.  We discussed common possible etiologies of hematuria including BPH, malignancy, urolithiasis, medical renal disease, and idiopathic. Standard workup recommended by the AUA includes imaging with CT urogram to assess the upper tracts, and cystoscopy. Cytology is performed on patient's with gross hematuria to look for malignant cells in the urine.  CT urogram and cystoscopy for further evaluation of gross hematuria   Legrand Rams, MD 01/12/2023  [facility] [address], [address] [place], [place] [address] [phone]

## 2023-01-12 NOTE — Patient Instructions (Signed)

## 2023-01-12 NOTE — Telephone Encounter (Signed)
Patient is due for diabetes follow-up. Last follow-up appears to be 05/30/21 with Dr. Sherrie Mustache. Can you please reach out to the patient and see about scheduling an initial visit with Dr. Neita Garnet?   Thanks, Angelena Sole, PharmD, Patsy Baltimore, CPP  Clinical Pharmacist Practitioner  [facility] [phone]

## 2023-01-13 DIAGNOSIS — N186 End stage renal disease: Secondary | ICD-10-CM | POA: Diagnosis not present

## 2023-01-13 DIAGNOSIS — D2272 Melanocytic nevi of left lower limb, including hip: Secondary | ICD-10-CM | POA: Diagnosis not present

## 2023-01-13 DIAGNOSIS — Z992 Dependence on renal dialysis: Secondary | ICD-10-CM | POA: Diagnosis not present

## 2023-01-13 DIAGNOSIS — D2262 Melanocytic nevi of left upper limb, including shoulder: Secondary | ICD-10-CM | POA: Diagnosis not present

## 2023-01-13 DIAGNOSIS — D2271 Melanocytic nevi of right lower limb, including hip: Secondary | ICD-10-CM | POA: Diagnosis not present

## 2023-01-13 DIAGNOSIS — D2261 Melanocytic nevi of right upper limb, including shoulder: Secondary | ICD-10-CM | POA: Diagnosis not present

## 2023-01-13 DIAGNOSIS — N2581 Secondary hyperparathyroidism of renal origin: Secondary | ICD-10-CM | POA: Diagnosis not present

## 2023-01-13 DIAGNOSIS — L821 Other seborrheic keratosis: Secondary | ICD-10-CM | POA: Diagnosis not present

## 2023-01-13 DIAGNOSIS — Z08 Encounter for follow-up examination after completed treatment for malignant neoplasm: Secondary | ICD-10-CM | POA: Diagnosis not present

## 2023-01-13 DIAGNOSIS — L218 Other seborrheic dermatitis: Secondary | ICD-10-CM | POA: Diagnosis not present

## 2023-01-13 DIAGNOSIS — B354 Tinea corporis: Secondary | ICD-10-CM | POA: Diagnosis not present

## 2023-01-13 DIAGNOSIS — D225 Melanocytic nevi of trunk: Secondary | ICD-10-CM | POA: Diagnosis not present

## 2023-01-15 DIAGNOSIS — N2581 Secondary hyperparathyroidism of renal origin: Secondary | ICD-10-CM | POA: Diagnosis not present

## 2023-01-15 DIAGNOSIS — Z992 Dependence on renal dialysis: Secondary | ICD-10-CM | POA: Diagnosis not present

## 2023-01-15 DIAGNOSIS — N186 End stage renal disease: Secondary | ICD-10-CM | POA: Diagnosis not present

## 2023-01-18 DIAGNOSIS — N2581 Secondary hyperparathyroidism of renal origin: Secondary | ICD-10-CM | POA: Diagnosis not present

## 2023-01-18 DIAGNOSIS — N186 End stage renal disease: Secondary | ICD-10-CM | POA: Diagnosis not present

## 2023-01-18 DIAGNOSIS — Z992 Dependence on renal dialysis: Secondary | ICD-10-CM | POA: Diagnosis not present

## 2023-01-20 DIAGNOSIS — N186 End stage renal disease: Secondary | ICD-10-CM | POA: Diagnosis not present

## 2023-01-20 DIAGNOSIS — Z992 Dependence on renal dialysis: Secondary | ICD-10-CM | POA: Diagnosis not present

## 2023-01-20 DIAGNOSIS — N2581 Secondary hyperparathyroidism of renal origin: Secondary | ICD-10-CM | POA: Diagnosis not present

## 2023-01-22 DIAGNOSIS — N2581 Secondary hyperparathyroidism of renal origin: Secondary | ICD-10-CM | POA: Diagnosis not present

## 2023-01-22 DIAGNOSIS — Z992 Dependence on renal dialysis: Secondary | ICD-10-CM | POA: Diagnosis not present

## 2023-01-22 DIAGNOSIS — N186 End stage renal disease: Secondary | ICD-10-CM | POA: Diagnosis not present

## 2023-01-25 ENCOUNTER — Telehealth: Payer: Self-pay | Admitting: Urology

## 2023-01-25 DIAGNOSIS — Z992 Dependence on renal dialysis: Secondary | ICD-10-CM | POA: Diagnosis not present

## 2023-01-25 DIAGNOSIS — N2581 Secondary hyperparathyroidism of renal origin: Secondary | ICD-10-CM | POA: Diagnosis not present

## 2023-01-25 DIAGNOSIS — N186 End stage renal disease: Secondary | ICD-10-CM | POA: Diagnosis not present

## 2023-01-25 NOTE — Telephone Encounter (Signed)
Patient's wife Rinaldo Cloud) called regarding cancellation of CT for 01/26/23 because insurance has not authorized. She would like an update of where we are in the process. Please call her to update status. Her number is [phone]

## 2023-01-26 ENCOUNTER — Ambulatory Visit: Admission: RE | Admit: 2023-01-26 | Payer: Medicare HMO | Source: Ambulatory Visit

## 2023-01-27 DIAGNOSIS — N186 End stage renal disease: Secondary | ICD-10-CM | POA: Diagnosis not present

## 2023-01-27 DIAGNOSIS — N2581 Secondary hyperparathyroidism of renal origin: Secondary | ICD-10-CM | POA: Diagnosis not present

## 2023-01-27 DIAGNOSIS — Z992 Dependence on renal dialysis: Secondary | ICD-10-CM | POA: Diagnosis not present

## 2023-01-29 ENCOUNTER — Ambulatory Visit
Admission: RE | Admit: 2023-01-29 | Discharge: 2023-01-29 | Disposition: A | Discharge status: Home / Self Care | Payer: Medicare HMO | Source: Ambulatory Visit | Attending: Urology | Admitting: Urology

## 2023-01-29 DIAGNOSIS — K5730 Diverticulosis of large intestine without perforation or abscess without bleeding: Secondary | ICD-10-CM | POA: Diagnosis not present

## 2023-01-29 DIAGNOSIS — R310 Gross hematuria: Secondary | ICD-10-CM | POA: Insufficient documentation

## 2023-01-29 DIAGNOSIS — N2581 Secondary hyperparathyroidism of renal origin: Secondary | ICD-10-CM | POA: Diagnosis not present

## 2023-01-29 DIAGNOSIS — N281 Cyst of kidney, acquired: Secondary | ICD-10-CM | POA: Diagnosis not present

## 2023-01-29 DIAGNOSIS — Z992 Dependence on renal dialysis: Secondary | ICD-10-CM | POA: Diagnosis not present

## 2023-01-29 DIAGNOSIS — N186 End stage renal disease: Secondary | ICD-10-CM | POA: Diagnosis not present

## 2023-01-29 MED ORDER — IOHEXOL 300 MG/ML  SOLN
125.0000 mL | Freq: Once | INTRAMUSCULAR | Status: AC | PRN
  Administered 2023-01-29: 125 mL via INTRAVENOUS

## 2023-01-31 DIAGNOSIS — N186 End stage renal disease: Secondary | ICD-10-CM | POA: Diagnosis not present

## 2023-01-31 DIAGNOSIS — Z992 Dependence on renal dialysis: Secondary | ICD-10-CM | POA: Diagnosis not present

## 2023-01-31 DIAGNOSIS — I129 Hypertensive chronic kidney disease with stage 1 through stage 4 chronic kidney disease, or unspecified chronic kidney disease: Secondary | ICD-10-CM | POA: Diagnosis not present

## 2023-02-01 DIAGNOSIS — Z992 Dependence on renal dialysis: Secondary | ICD-10-CM | POA: Diagnosis not present

## 2023-02-01 DIAGNOSIS — N2581 Secondary hyperparathyroidism of renal origin: Secondary | ICD-10-CM | POA: Diagnosis not present

## 2023-02-01 DIAGNOSIS — N186 End stage renal disease: Secondary | ICD-10-CM | POA: Diagnosis not present

## 2023-02-03 DIAGNOSIS — N186 End stage renal disease: Secondary | ICD-10-CM | POA: Diagnosis not present

## 2023-02-03 DIAGNOSIS — N2581 Secondary hyperparathyroidism of renal origin: Secondary | ICD-10-CM | POA: Diagnosis not present

## 2023-02-03 DIAGNOSIS — Z992 Dependence on renal dialysis: Secondary | ICD-10-CM | POA: Diagnosis not present

## 2023-02-05 DIAGNOSIS — N186 End stage renal disease: Secondary | ICD-10-CM | POA: Diagnosis not present

## 2023-02-05 DIAGNOSIS — N2581 Secondary hyperparathyroidism of renal origin: Secondary | ICD-10-CM | POA: Diagnosis not present

## 2023-02-05 DIAGNOSIS — Z992 Dependence on renal dialysis: Secondary | ICD-10-CM | POA: Diagnosis not present

## 2023-02-08 DIAGNOSIS — Z992 Dependence on renal dialysis: Secondary | ICD-10-CM | POA: Diagnosis not present

## 2023-02-08 DIAGNOSIS — N186 End stage renal disease: Secondary | ICD-10-CM | POA: Diagnosis not present

## 2023-02-08 DIAGNOSIS — N2581 Secondary hyperparathyroidism of renal origin: Secondary | ICD-10-CM | POA: Diagnosis not present

## 2023-02-09 DIAGNOSIS — I871 Compression of vein: Secondary | ICD-10-CM | POA: Diagnosis not present

## 2023-02-09 DIAGNOSIS — Z992 Dependence on renal dialysis: Secondary | ICD-10-CM | POA: Diagnosis not present

## 2023-02-09 DIAGNOSIS — N186 End stage renal disease: Secondary | ICD-10-CM | POA: Diagnosis not present

## 2023-02-09 DIAGNOSIS — T82858A Stenosis of vascular prosthetic devices, implants and grafts, initial encounter: Secondary | ICD-10-CM | POA: Diagnosis not present

## 2023-02-10 DIAGNOSIS — N2581 Secondary hyperparathyroidism of renal origin: Secondary | ICD-10-CM | POA: Diagnosis not present

## 2023-02-10 DIAGNOSIS — N186 End stage renal disease: Secondary | ICD-10-CM | POA: Diagnosis not present

## 2023-02-10 DIAGNOSIS — Z992 Dependence on renal dialysis: Secondary | ICD-10-CM | POA: Diagnosis not present

## 2023-02-11 ENCOUNTER — Other Ambulatory Visit: Payer: Self-pay | Admitting: Cardiovascular Disease

## 2023-02-11 DIAGNOSIS — E785 Hyperlipidemia, unspecified: Secondary | ICD-10-CM

## 2023-02-12 DIAGNOSIS — N2581 Secondary hyperparathyroidism of renal origin: Secondary | ICD-10-CM | POA: Diagnosis not present

## 2023-02-12 DIAGNOSIS — Z992 Dependence on renal dialysis: Secondary | ICD-10-CM | POA: Diagnosis not present

## 2023-02-12 DIAGNOSIS — N186 End stage renal disease: Secondary | ICD-10-CM | POA: Diagnosis not present

## 2023-02-15 ENCOUNTER — Other Ambulatory Visit: Payer: Self-pay | Admitting: Cardiovascular Disease

## 2023-02-15 DIAGNOSIS — N186 End stage renal disease: Secondary | ICD-10-CM | POA: Diagnosis not present

## 2023-02-15 DIAGNOSIS — N2581 Secondary hyperparathyroidism of renal origin: Secondary | ICD-10-CM | POA: Diagnosis not present

## 2023-02-15 DIAGNOSIS — I25118 Atherosclerotic heart disease of native coronary artery with other forms of angina pectoris: Secondary | ICD-10-CM

## 2023-02-15 DIAGNOSIS — Z992 Dependence on renal dialysis: Secondary | ICD-10-CM | POA: Diagnosis not present

## 2023-02-15 NOTE — Telephone Encounter (Signed)
Please schedule overdue F/U appointment for 90 day refills. Thank you! 

## 2023-02-15 NOTE — Telephone Encounter (Signed)
Pt is scheduled on 8.8

## 2023-02-17 DIAGNOSIS — N2581 Secondary hyperparathyroidism of renal origin: Secondary | ICD-10-CM | POA: Diagnosis not present

## 2023-02-17 DIAGNOSIS — N186 End stage renal disease: Secondary | ICD-10-CM | POA: Diagnosis not present

## 2023-02-17 DIAGNOSIS — Z992 Dependence on renal dialysis: Secondary | ICD-10-CM | POA: Diagnosis not present

## 2023-02-18 ENCOUNTER — Encounter: Payer: Self-pay | Admitting: Urology

## 2023-02-18 ENCOUNTER — Ambulatory Visit (INDEPENDENT_AMBULATORY_CARE_PROVIDER_SITE_OTHER): Payer: Medicare HMO | Admitting: Urology

## 2023-02-18 VITALS — BP 122/68 | HR 80 | Ht 69.0 in | Wt 227.1 lb

## 2023-02-18 DIAGNOSIS — Z87440 Personal history of urinary (tract) infections: Secondary | ICD-10-CM

## 2023-02-18 DIAGNOSIS — R310 Gross hematuria: Secondary | ICD-10-CM | POA: Diagnosis not present

## 2023-02-18 MED ORDER — CEPHALEXIN 250 MG PO CAPS
500.0000 mg | ORAL_CAPSULE | Freq: Once | ORAL | Status: AC
Start: 2023-02-18 — End: 2023-02-18
  Administered 2023-02-18: 500 mg via ORAL

## 2023-02-18 NOTE — Progress Notes (Signed)
Cystoscopy Procedure Note:  Indication: Gross hematuria  History of eosinophilic cystitis status post resection in 2019, no problems really with urination since that time.  On ESRD, makes minimal urine.  Keflex given for prophylaxis  After informed consent and discussion of the procedure and its risks, Jeremy Dorsey was positioned and prepped in the standard fashion. Cystoscopy was performed with a flexible cystoscope. The urethra, bladder neck and entire bladder was visualized in a standard fashion. The prostate was moderate in size. The ureteral orifices were visualized in their normal location and orientation.  No bladder abnormalities, no abnormalities on retroflexion.  Imaging: CT urogram with no abnormalities  Findings: Normal cystoscopy  Assessment and Plan: Follow-up with urology as needed  Legrand Rams, MD 02/18/2023

## 2023-02-18 NOTE — Addendum Note (Signed)
Addended by: Sueanne Margarita on: 02/18/2023 02:19 PM   Modules accepted: Orders

## 2023-02-19 DIAGNOSIS — N186 End stage renal disease: Secondary | ICD-10-CM | POA: Diagnosis not present

## 2023-02-19 DIAGNOSIS — N2581 Secondary hyperparathyroidism of renal origin: Secondary | ICD-10-CM | POA: Diagnosis not present

## 2023-02-19 DIAGNOSIS — Z992 Dependence on renal dialysis: Secondary | ICD-10-CM | POA: Diagnosis not present

## 2023-02-22 DIAGNOSIS — Z992 Dependence on renal dialysis: Secondary | ICD-10-CM | POA: Diagnosis not present

## 2023-02-22 DIAGNOSIS — N186 End stage renal disease: Secondary | ICD-10-CM | POA: Diagnosis not present

## 2023-02-22 DIAGNOSIS — N2581 Secondary hyperparathyroidism of renal origin: Secondary | ICD-10-CM | POA: Diagnosis not present

## 2023-02-24 DIAGNOSIS — N186 End stage renal disease: Secondary | ICD-10-CM | POA: Diagnosis not present

## 2023-02-24 DIAGNOSIS — N2581 Secondary hyperparathyroidism of renal origin: Secondary | ICD-10-CM | POA: Diagnosis not present

## 2023-02-24 DIAGNOSIS — Z992 Dependence on renal dialysis: Secondary | ICD-10-CM | POA: Diagnosis not present

## 2023-02-26 DIAGNOSIS — Z992 Dependence on renal dialysis: Secondary | ICD-10-CM | POA: Diagnosis not present

## 2023-02-26 DIAGNOSIS — N186 End stage renal disease: Secondary | ICD-10-CM | POA: Diagnosis not present

## 2023-02-26 DIAGNOSIS — N2581 Secondary hyperparathyroidism of renal origin: Secondary | ICD-10-CM | POA: Diagnosis not present

## 2023-03-01 DIAGNOSIS — Z992 Dependence on renal dialysis: Secondary | ICD-10-CM | POA: Diagnosis not present

## 2023-03-01 DIAGNOSIS — N2581 Secondary hyperparathyroidism of renal origin: Secondary | ICD-10-CM | POA: Diagnosis not present

## 2023-03-01 DIAGNOSIS — N186 End stage renal disease: Secondary | ICD-10-CM | POA: Diagnosis not present

## 2023-03-03 ENCOUNTER — Ambulatory Visit (INDEPENDENT_AMBULATORY_CARE_PROVIDER_SITE_OTHER): Payer: Medicare HMO

## 2023-03-03 VITALS — Ht 70.0 in | Wt 227.0 lb

## 2023-03-03 DIAGNOSIS — Z992 Dependence on renal dialysis: Secondary | ICD-10-CM | POA: Diagnosis not present

## 2023-03-03 DIAGNOSIS — N2581 Secondary hyperparathyroidism of renal origin: Secondary | ICD-10-CM | POA: Diagnosis not present

## 2023-03-03 DIAGNOSIS — Z0000 Encounter for general adult medical examination without abnormal findings: Secondary | ICD-10-CM

## 2023-03-03 DIAGNOSIS — N186 End stage renal disease: Secondary | ICD-10-CM | POA: Diagnosis not present

## 2023-03-03 DIAGNOSIS — I129 Hypertensive chronic kidney disease with stage 1 through stage 4 chronic kidney disease, or unspecified chronic kidney disease: Secondary | ICD-10-CM | POA: Diagnosis not present

## 2023-03-03 NOTE — Progress Notes (Signed)
Subjective:   Jeremy Dorsey is a 72 y.o. male who presents for Medicare Annual/Subsequent preventive examination.  Visit Complete: Virtual  I connected with  Jeremy Dorsey on 03/03/23 by a audio enabled telemedicine application and verified that I am speaking with the correct person using two identifiers.  Patient Location: Home  Provider Location: Office/Clinic  I discussed the limitations of evaluation and management by telemedicine. The patient expressed understanding and agreed to proceed.  Vital Signs: Unable to obtain new vitals due to this being a telehealth visit.  Patient Medicare AWV questionnaire was completed by the patient on 03/02/23; I have confirmed that all information answered by patient is correct and no changes since this date.  Review of Systems    Cardiac Risk Factors include: advanced age (>20men, >39 women);dyslipidemia;diabetes mellitus;hypertension;male gender;obesity (BMI >30kg/m2);sedentary lifestyle    Objective:    Today's Vitals   03/03/23 1402  Weight: 227 lb (103 kg)  Height: 5\' 10"  (1.778 m)   Body mass index is 32.57 kg/m.     03/03/2023    2:10 PM 12/23/2022   11:25 AM 06/11/2022    5:56 AM 01/29/2022    8:41 AM 07/09/2021    6:57 AM 06/19/2021    9:44 AM 10/31/2020    6:45 AM  Advanced Directives  Does Patient Have a Medical Advance Directive? Yes No No No No No No  Type of Estate agent of [place];Living will        Would patient like information on creating a medical advance directive?   No - Patient declined No - Patient declined No - Patient declined No - Patient declined No - Patient declined    Current Medications (verified) Outpatient Encounter Medications as of 03/03/2023  Medication Sig   amLODipine (NORVASC) 5 MG tablet Take 5 mg by mouth every evening.   aspirin 81 MG EC tablet Take 81 mg by mouth in the morning. Swallow whole.   atorvastatin (LIPITOR) 80 MG tablet TAKE 1 TABLET BY MOUTH EVERY DAY    AURYXIA 1 GM 210 MG(Fe) tablet Take by mouth.   calcitRIOL (ROCALTROL) 0.25 MCG capsule Take 0.25 mcg by mouth in the morning.   carvedilol (COREG) 12.5 MG tablet Take 1 tablet by mouth 2 (two) times daily.   cinacalcet (SENSIPAR) 30 MG tablet PLEASE SEE ATTACHED FOR DETAILED DIRECTIONS   clopidogrel (PLAVIX) 75 MG tablet TAKE 1 TABLET BY MOUTH EVERY DAY   lidocaine-prilocaine (EMLA) cream Apply 1 Application topically daily as needed (prior to port being accessed.).   metoprolol tartrate (LOPRESSOR) 25 MG tablet Take 1 tablet by mouth 2 (two) times daily.   multivitamin (RENA-VIT) TABS tablet Take 1 tablet by mouth daily.   nitroGLYCERIN (NITROSTAT) 0.4 MG SL tablet Place 1 tablet (0.4 mg total) under the tongue every 5 (five) minutes x 3 doses as needed for chest pain.   ofloxacin (OCUFLOX) 0.3 % ophthalmic solution 1 drop 4 (four) times daily.   sevelamer carbonate (RENVELA) 800 MG tablet Take 800 mg by mouth with breakfast, with lunch, and with evening meal.   sildenafil (VIAGRA) 100 MG tablet Take 100 mg by mouth as needed for erectile dysfunction.   torsemide (DEMADEX) 100 MG tablet Take 100 mg by mouth every morning.   Facility-Administered Encounter Medications as of 03/03/2023  Medication   0.9 %  sodium chloride infusion   0.9 %  sodium chloride infusion   sodium chloride flush (NS) 0.9 % injection 3 mL   sodium  chloride flush (NS) 0.9 % injection 3 mL   sodium chloride flush (NS) 0.9 % injection 3 mL   sodium chloride flush (NS) 0.9 % injection 3 mL    Allergies (verified) Oxycodone and Hydrocodone   History: Past Medical History:  Diagnosis Date   Anemia    Cancer (HCC)    basal cell nose and forehead   Cataract 2016   Cataract surgery bith eyes   CKD (chronic kidney disease), stage IV (HCC)    Coronary artery disease    a. s/p IVUS-guided DESx2 to prox & mid LAD, residual disease treated medically. EF 50-55% by recent echo 02/2020.   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   Urinary complication    Past Surgical History:  Procedure Laterality Date   ABDOMINAL AORTOGRAM W/LOWER EXTREMITY N/A 01/29/2022   Procedure: ABDOMINAL AORTOGRAM W/ Bilateral LOWER EXTREMITY [place];  Surgeon: Cephus Shelling, MD;  Location: [facility];  Service: Cardiovascular;  Laterality: N/A;   ABDOMINAL AORTOGRAM W/LOWER EXTREMITY N/A 06/11/2022   Procedure: ABDOMINAL AORTOGRAM W/LOWER EXTREMITY;  Surgeon: Cephus Shelling, MD;  Location: [facility];  Service: Cardiovascular;  Laterality: N/A;   AV FISTULA PLACEMENT Left 10/31/2020   Procedure: LEFT UPPER EXTREMITY ARTERIOVENOUS (AV) FISTULA CREATION;  Surgeon: Maeola Harman, MD;  Location: [facility] OR;  Service: Vascular;  Laterality: Left;   AV FISTULA PLACEMENT Left 07/09/2021   Procedure: LEFT ARM ARTERIOVENOUS (AV) FISTULA CREATION;  Surgeon: Nada Libman, MD;  Location: [facility] OR;  Service: Vascular;  Laterality: Left;   COLONOSCOPY  1990   COLONOSCOPY  06/09/2011   Dr Lemar Livings   CORONARY STENT INTERVENTION N/A 04/24/2020   Procedure: CORONARY STENT INTERVENTION;  Surgeon: Iran Ouch, MD;  [facility]: [facility];  Service: Cardiovascular;  Laterality: N/A;   CORONARY ULTRASOUND/IVUS N/A 04/24/2020   Procedure: Intravascular Ultrasound/IVUS;  Surgeon: Iran Ouch, MD;  [facility]: [facility];  Service: Cardiovascular;  Laterality: N/A;   CYSTOSCOPY W/ RETROGRADES Bilateral 08/01/2018   Procedure: CYSTOSCOPY WITH RETROGRADE PYELOGRAM;  Surgeon: Sondra Come, MD;  Location: [facility];  Service: Urology;  Laterality: Bilateral;   CYSTOSCOPY WITH BIOPSY N/A 08/01/2018   Procedure: CYSTOSCOPY WITH Bladder BIOPSY;  Surgeon: Sondra Come, MD;  Location: [facility];  Service: Urology;  Laterality: N/A;    CYSTOSCOPY WITH STENT PLACEMENT Right 08/01/2018   Procedure: CYSTOSCOPY WITH STENT PLACEMENT;  Surgeon: Sondra Come, MD;  Location: [facility];  Service: Urology;  Laterality: Right;   EYE SURGERY Right    laser surgery   FRACTURE SURGERY     Rt shoulder rotaor cuff   LEFT HEART CATH AND CORONARY ANGIOGRAPHY N/A 04/24/2020   Procedure: LEFT HEART CATH AND CORONARY ANGIOGRAPHY;  Surgeon: Iran Ouch, MD;  [facility];  Service: Cardiovascular;  Laterality: N/A;   PERIPHERAL VASCULAR BALLOON ANGIOPLASTY Right 01/29/2022   Procedure: PERIPHERAL VASCULAR BALLOON ANGIOPLASTY;  Surgeon: Cephus Shelling, MD;  [facility]: [facility];  Service: Cardiovascular;  Laterality: Right;   PERIPHERAL VASCULAR BALLOON ANGIOPLASTY Right 06/11/2022   Procedure: PERIPHERAL VASCULAR BALLOON ANGIOPLASTY;  Surgeon: Cephus Shelling, MD;  [facility]: [facility];  Service: Cardiovascular;  Laterality: Right;  Peroneal  SHOULDER ARTHROSCOPY WITH OPEN ROTATOR CUFF REPAIR Right 08/25/2018   Procedure: SHOULDER ARTHROSCOPY WITH MINI OPEN ROTATOR CUFF REPAIR;  Surgeon: Juanell Fairly, MD;  Location: [facility];  Service: Orthopedics;  Laterality: Right;   TRANSURETHRAL RESECTION OF BLADDER TUMOR N/A 08/01/2018   Procedure: TRANSURETHRAL RESECTION OF BLADDER TUMOR (TURBT);  Surgeon: Sondra Come, MD;  Location: [facility];  Service: Urology;  Laterality: N/A;   URETEROSCOPY WITH HOLMIUM LASER LITHOTRIPSY Right 08/01/2018   Procedure: URETEROSCOPY WITH HOLMIUM LASER LITHOTRIPSY;  Surgeon: Sondra Come, MD;  Location: [facility];  Service: Urology;  Laterality: Right;   Family History  Problem Relation Age of Onset   Psoriasis Mother    Heart failure Father    Social History   Socioeconomic History   Marital status: Married    Spouse name: Not on file   Number of children: 3   Years of education: Not on file   Highest education level: Bachelor's degree (e.g., BA,  AB, BS)  Occupational History    Comment: retired  Tobacco Use   Smoking status: Former    Current packs/day: 0.00    Average packs/day: 1 pack/day for 30.0 years (30.0 ttl pk-yrs)    Types: Cigarettes    Start date: 08/22/1977    Quit date: 08/23/2007    Years since quitting: 15.5    Passive exposure: Past   Smokeless tobacco: Never  Vaping Use   Vaping status: Never Used  Substance and Sexual Activity   Alcohol use: No   Drug use: No   Sexual activity: Not Currently    Birth control/protection: None  Other Topics Concern   Not on file  Social History Narrative   Not on file   Social Determinants of Health   Financial Resource Strain: Patient Declined (03/02/2023)   Overall Financial Resource Strain (CARDIA)    Difficulty of Paying Living Expenses: Patient declined  Food Insecurity: Patient Declined (03/02/2023)   Hunger Vital Sign    Worried About Running Out of Food in the Last Year: Patient declined    Ran Out of Food in the Last Year: Patient declined  Transportation Needs: Patient Declined (03/02/2023)   PRAPARE - Administrator, Civil Service (Medical): Patient declined    Lack of Transportation (Non-Medical): Patient declined  Physical Activity: Patient Declined (03/02/2023)   Exercise Vital Sign    Days of Exercise per Week: Patient declined    Minutes of Exercise per Session: Patient declined  Stress: Patient Declined (03/02/2023)   [employer] of Occupational Health - Occupational Stress Questionnaire    Feeling of Stress : Patient declined  Social Connections: Unknown (03/02/2023)   Social Connection and Isolation Panel [NHANES]    Frequency of Communication with Friends and Family: Patient declined    Frequency of Social Gatherings with Friends and Family: Patient declined    Attends Religious Services: Not on Marketing executive or Organizations: Patient declined    Attends Banker Meetings: Patient declined    Marital  Status: Patient declined    Tobacco Counseling Counseling given: Not Answered   Clinical Intake:  Pre-visit preparation completed: Yes  Pain : No/denies pain     BMI - recorded: 32.57 Nutritional Status: BMI > 30  Obese Nutritional Risks: None Diabetes: Yes CBG done?: No Did pt. bring in CBG monitor from home?: No  How often do you need to have someone help you when you read instructions, pamphlets, or other written materials  from your doctor or pharmacy?: 1 - Never  Interpreter Needed?: No  Comments: lives wife and family Information entered by :: B.Esme Durkin,LPN   Activities of Daily Living    03/02/2023   11:52 AM 12/16/2022    9:43 AM  In your present state of health, do you have any difficulty performing the following activities:  Hearing? 0 0  Vision? 0 0  Difficulty concentrating or making decisions? 0 0  Walking or climbing stairs? 0 0  Dressing or bathing? 0 0  Doing errands, shopping? 0 0  Preparing Food and eating ? N   Using the Toilet? N   In the past six months, have you accidently leaked urine? N   Do you have problems with loss of bowel control? N   Managing your Medications? N   Managing your Finances? N   Housekeeping or managing your Housekeeping? N     Patient Care Team: Ronnald Ramp, MD as PCP - General (Family Medicine) Annie Sable, MD as Consulting Physician (Nephrology) Irene Limbo., MD (Ophthalmology) Dasher, Cliffton Asters, MD (Dermatology)  Indicate any recent Medical Services you may have received from other than [facility] providers in the past year (date may be approximate).     Assessment:   This is a routine wellness examination for [place].  Hearing/Vision screen Hearing Screening - Comments:: Adequate hearing Vision Screening - Comments:: Adequate vision after cataract surgery 2016 Dr Alvester Morin  Dietary issues and exercise activities discussed:     Goals Addressed             This Visit's Progress     DIET - EAT MORE FRUITS AND VEGETABLES   On track      Depression Screen    03/03/2023    2:09 PM 12/16/2022    9:43 AM 01/05/2022    3:34 PM 06/19/2021    9:46 AM 06/19/2021    9:41 AM 05/30/2021    9:21 AM 11/06/2020    1:50 PM  PHQ 2/9 Scores  PHQ - 2 Score 0 0 1 0 0 0 0  PHQ- 9 Score  0 4 0 0 1 0    Fall Risk    03/02/2023   11:52 AM 12/16/2022    9:43 AM 01/05/2022    3:34 PM 06/19/2021    9:46 AM 05/30/2021    9:21 AM  Fall Risk   Falls in the past year? 0 0 0 0 0  Number falls in past yr:  0 0 0 0  Injury with Fall?  0 0 0 0  Risk for fall due to :  History of fall(s)  No Fall Risks Impaired balance/gait;History of fall(s)  Follow up  Falls evaluation completed  Falls prevention discussed Falls evaluation completed    MEDICARE RISK AT HOME:  Medicare Risk at Home - 03/02/23 1152     Home free of loose throw rugs in walkways, pet beds, electrical cords, etc? Yes    Adequate lighting in your home to reduce risk of falls? Yes    Life alert? No    Use of a cane, walker or w/c? No    Grab bars in the bathroom? No    Shower chair or bench in shower? Yes    Elevated toilet seat or a handicapped toilet? No             TIMED UP AND GO:  Was the test performed?  No    Cognitive Function:  03/03/2023    2:12 PM  6CIT Screen  What Year? 0 points  What month? 0 points  What time? 0 points  Count back from 20 0 points  Months in reverse 0 points  Repeat phrase 0 points  Total Score 0 points    Immunizations Immunization History  Administered Date(s) Administered   Fluad Quad(high Dose 65+) 05/24/2019, 05/22/2020, 05/30/2021   Hepb-cpg 01/01/2022, 06/30/2022, 12/22/2022   Influenza Split 06/07/2006   Influenza, High Dose Seasonal PF 06/10/2018   Influenza,inj,Quad PF,6+ Mos 07/25/2013, 09/06/2014, 06/10/2022   Influenza-Unspecified 05/24/2019, 05/22/2020   PFIZER Comirnaty(Gray Top)Covid-19 Tri-Sucrose Vaccine 09/26/2019, 10/17/2019   PFIZER(Purple  Top)SARS-COV-2 Vaccination 09/26/2019, 10/17/2019   Pneumococcal Conjugate-13 05/24/2019   Pneumococcal Polysaccharide-23 06/14/2004, 05/22/2020   Zoster, Live 01/23/2011    TDAP status: Up to date  Flu Vaccine status: Up to date  Pneumococcal vaccine status: Up to date  Covid-19 vaccine status: Completed vaccines  Qualifies for Shingles Vaccine? Yes   Zostavax completed No   Shingrix Completed?: No.    Education has been provided regarding the importance of this vaccine. Patient has been advised to call insurance company to determine out of pocket expense if they have not yet received this vaccine. Advised may also receive vaccine at local pharmacy or Health Dept. Verbalized acceptance and understanding.  Screening Tests Health Maintenance  Topic Date Due   DTaP/Tdap/Td (1 - Tdap) Never done   Zoster Vaccines- Shingrix (1 of 2) 03/30/1970   FOOT EXAM  05/17/2021   Colonoscopy  06/08/2021   HEMOGLOBIN A1C  11/18/2021   OPHTHALMOLOGY EXAM  01/02/2022   COVID-19 Vaccine (5 - 2023-24 season) 04/03/2022   INFLUENZA VACCINE  03/04/2023   Medicare Annual Wellness (AWV)  03/02/2024   Pneumonia Vaccine 30+ Years old  Completed   Hepatitis C Screening  Completed   HPV VACCINES  Aged Out    Health Maintenance  Health Maintenance Due  Topic Date Due   DTaP/Tdap/Td (1 - Tdap) Never done   Zoster Vaccines- Shingrix (1 of 2) 03/30/1970   FOOT EXAM  05/17/2021   Colonoscopy  06/08/2021   HEMOGLOBIN A1C  11/18/2021   OPHTHALMOLOGY EXAM  01/02/2022   COVID-19 Vaccine (5 - 2023-24 season) 04/03/2022    Colorectal cancer screening: Type of screening: Colonoscopy. Completed no. Repeat every 5 yearsPt uncertain wants to discuss with PCP  Lung Cancer Screening: (Low Dose CT Chest recommended if Age 82-80 years, 20 pack-year currently smoking OR have quit w/in 15years.) does not qualify.   Lung Cancer Screening Referral: no  Additional Screening:  Hepatitis C Screening: does not  qualify; Completed yes  Vision Screening: Recommended annual ophthalmology exams for early detection of glaucoma and other disorders of the eye. Is the patient up to date with their annual eye exam?  Yes  Who is the provider or what is the name of the office in which the patient attends annual eye exams? Dr Alvester Morin If pt is not established with a provider, would they like to be referred to a provider to establish care? No .   Dental Screening: Recommended annual dental exams for proper oral hygiene  Diabetic Foot Exam: Diabetic Foot Exam: Overdue, Pt has been advised about the importance in completing this exam. Pt is scheduled for diabetic foot exam on next PCP visit.  Community Resource Referral / Chronic Care Management: CRR required this visit?  No   CCM required this visit?  No     Plan:     I have  personally reviewed and noted the following in the patient's chart:   Medical and social history Use of alcohol, tobacco or illicit drugs  Current medications and supplements including opioid prescriptions. Patient is not currently taking opioid prescriptions. Functional ability and status Nutritional status Physical activity Advanced directives List of other physicians Hospitalizations, surgeries, and ER visits in previous 12 months Vitals Screenings to include cognitive, depression, and falls Referrals and appointments  In addition, I have reviewed and discussed with patient certain preventive protocols, quality metrics, and best practice recommendations. A written personalized care plan for preventive services as well as general preventive health recommendations were provided to patient.     Sue Lush, LPN   11/09/8117   After Visit Summary: (MyChart) Due to this being a telephonic visit, the after visit summary with patients personalized plan was offered to patient via MyChart   Nurse Notes: The patient states he is doing alright and has no concerns or questions at  this time.

## 2023-03-03 NOTE — Patient Instructions (Addendum)
Jeremy Dorsey , Thank you for taking time to come for your Medicare Wellness Visit. I appreciate your ongoing commitment to your health goals. Please review the following plan we discussed and let me know if I can assist you in the future.   Referrals/Orders/Follow-Ups/Clinician Recommendations: none  This is a list of the screening recommended for you and due dates:  Health Maintenance  Topic Date Due   DTaP/Tdap/Td vaccine (1 - Tdap) Never done   Zoster (Shingles) Vaccine (1 of 2) 03/30/1970   Complete foot exam   05/17/2021   Colon Cancer Screening  06/08/2021   Hemoglobin A1C  11/18/2021   Eye exam for diabetics  01/02/2022   COVID-19 Vaccine (5 - 2023-24 season) 04/03/2022   Flu Shot  03/04/2023   Medicare Annual Wellness Visit  03/02/2024   Pneumonia Vaccine  Completed   Hepatitis C Screening  Completed   HPV Vaccine  Aged Out    Advanced directives: (Copy Requested) Please bring a copy of your health care power of attorney and living will to the office to be added to your chart at your convenience.  Next Medicare Annual Wellness Visit scheduled for next year: Yes8/6/25 @ 3:30am telephone  Preventive Care 65 Years and Older, Male  Preventive care refers to lifestyle choices and visits with your health care provider that can promote health and wellness. What does preventive care include? A yearly physical exam. This is also called an annual well check. Dental exams once or twice a year. Routine eye exams. Ask your health care provider how often you should have your eyes checked. Personal lifestyle choices, including: Daily care of your teeth and gums. Regular physical activity. Eating a healthy diet. Avoiding tobacco and drug use. Limiting alcohol use. Practicing safe sex. Taking low doses of aspirin every day. Taking vitamin and mineral supplements as recommended by your health care provider. What happens during an annual well check? The services and screenings done by  your health care provider during your annual well check will depend on your age, overall health, lifestyle risk factors, and family history of disease. Counseling  Your health care provider may ask you questions about your: Alcohol use. Tobacco use. Drug use. Emotional well-being. Home and relationship well-being. Sexual activity. Eating habits. History of falls. Memory and ability to understand (cognition). Work and work Astronomer. Screening  You may have the following tests or measurements: Height, weight, and BMI. Blood pressure. Lipid and cholesterol levels. These may be checked every 5 years, or more frequently if you are over 73 years old. Skin check. Lung cancer screening. You may have this screening every year starting at age 21 if you have a 30-pack-year history of smoking and currently smoke or have quit within the past 15 years. Fecal occult blood test (FOBT) of the stool. You may have this test every year starting at age 29. Flexible sigmoidoscopy or colonoscopy. You may have a sigmoidoscopy every 5 years or a colonoscopy every 10 years starting at age 69. Prostate cancer screening. Recommendations will vary depending on your family history and other risks. Hepatitis C blood test. Hepatitis B blood test. Sexually transmitted disease (STD) testing. Diabetes screening. This is done by checking your blood sugar (glucose) after you have not eaten for a while (fasting). You may have this done every 1-3 years. Abdominal aortic aneurysm (AAA) screening. You may need this if you are a current or former smoker. Osteoporosis. You may be screened starting at age 35 if you are at high  risk. Talk with your health care provider about your test results, treatment options, and if necessary, the need for more tests. Vaccines  Your health care provider may recommend certain vaccines, such as: Influenza vaccine. This is recommended every year. Tetanus, diphtheria, and acellular pertussis  (Tdap, Td) vaccine. You may need a Td booster every 10 years. Zoster vaccine. You may need this after age 33. Pneumococcal 13-valent conjugate (PCV13) vaccine. One dose is recommended after age 56. Pneumococcal polysaccharide (PPSV23) vaccine. One dose is recommended after age 71. Talk to your health care provider about which screenings and vaccines you need and how often you need them. This information is not intended to replace advice given to you by your health care provider. Make sure you discuss any questions you have with your health care provider. Document Released: 08/16/2015 Document Revised: 04/08/2016 Document Reviewed: 05/21/2015 Elsevier Interactive Patient Education  2017 [employer].  Fall Prevention in the Home Falls can cause injuries. They can happen to people of all ages. There are many things you can do to make your home safe and to help prevent falls. What can I do on the outside of my home? Regularly fix the edges of walkways and driveways and fix any cracks. Remove anything that might make you trip as you walk through a door, such as a raised step or threshold. Trim any bushes or trees on the path to your home. Use bright outdoor lighting. Clear any walking paths of anything that might make someone trip, such as rocks or tools. Regularly check to see if handrails are loose or broken. Make sure that both sides of any steps have handrails. Any raised decks and porches should have guardrails on the edges. Have any leaves, snow, or ice cleared regularly. Use sand or salt on walking paths during winter. Clean up any spills in your garage right away. This includes oil or grease spills. What can I do in the bathroom? Use night lights. Install grab bars by the toilet and in the tub and shower. Do not use towel bars as grab bars. Use non-skid mats or decals in the tub or shower. If you need to sit down in the shower, use a plastic, non-slip stool. Keep the floor dry. Clean  up any water that spills on the floor as soon as it happens. Remove soap buildup in the tub or shower regularly. Attach bath mats securely with double-sided non-slip rug tape. Do not have throw rugs and other things on the floor that can make you trip. What can I do in the bedroom? Use night lights. Make sure that you have a light by your bed that is easy to reach. Do not use any sheets or blankets that are too big for your bed. They should not hang down onto the floor. Have a firm chair that has side arms. You can use this for support while you get dressed. Do not have throw rugs and other things on the floor that can make you trip. What can I do in the kitchen? Clean up any spills right away. Avoid walking on wet floors. Keep items that you use a lot in easy-to-reach places. If you need to reach something above you, use a strong step stool that has a grab bar. Keep electrical cords out of the way. Do not use floor polish or wax that makes floors slippery. If you must use wax, use non-skid floor wax. Do not have throw rugs and other things on the floor that can  make you trip. What can I do with my stairs? Do not leave any items on the stairs. Make sure that there are handrails on both sides of the stairs and use them. Fix handrails that are broken or loose. Make sure that handrails are as long as the stairways. Check any carpeting to make sure that it is firmly attached to the stairs. Fix any carpet that is loose or worn. Avoid having throw rugs at the top or bottom of the stairs. If you do have throw rugs, attach them to the floor with carpet tape. Make sure that you have a light switch at the top of the stairs and the bottom of the stairs. If you do not have them, ask someone to add them for you. What else can I do to help prevent falls? Wear shoes that: Do not have high heels. Have rubber bottoms. Are comfortable and fit you well. Are closed at the toe. Do not wear sandals. If you  use a stepladder: Make sure that it is fully opened. Do not climb a closed stepladder. Make sure that both sides of the stepladder are locked into place. Ask someone to hold it for you, if possible. Clearly mark and make sure that you can see: Any grab bars or handrails. First and last steps. Where the edge of each step is. Use tools that help you move around (mobility aids) if they are needed. These include: Canes. Walkers. Scooters. Crutches. Turn on the lights when you go into a dark area. Replace any light bulbs as soon as they burn out. Set up your furniture so you have a clear path. Avoid moving your furniture around. If any of your floors are uneven, fix them. If there are any pets around you, be aware of where they are. Review your medicines with your doctor. Some medicines can make you feel dizzy. This can increase your chance of falling. Ask your doctor what other things that you can do to help prevent falls. This information is not intended to replace advice given to you by your health care provider. Make sure you discuss any questions you have with your health care provider. Document Released: 05/16/2009 Document Revised: 12/26/2015 Document Reviewed: 08/24/2014 Elsevier Interactive Patient Education  2017 [employer].

## 2023-03-05 DIAGNOSIS — N2581 Secondary hyperparathyroidism of renal origin: Secondary | ICD-10-CM | POA: Diagnosis not present

## 2023-03-05 DIAGNOSIS — Z992 Dependence on renal dialysis: Secondary | ICD-10-CM | POA: Diagnosis not present

## 2023-03-05 DIAGNOSIS — N186 End stage renal disease: Secondary | ICD-10-CM | POA: Diagnosis not present

## 2023-03-08 DIAGNOSIS — N186 End stage renal disease: Secondary | ICD-10-CM | POA: Diagnosis not present

## 2023-03-08 DIAGNOSIS — Z992 Dependence on renal dialysis: Secondary | ICD-10-CM | POA: Diagnosis not present

## 2023-03-08 DIAGNOSIS — N2581 Secondary hyperparathyroidism of renal origin: Secondary | ICD-10-CM | POA: Diagnosis not present

## 2023-03-10 DIAGNOSIS — N186 End stage renal disease: Secondary | ICD-10-CM | POA: Diagnosis not present

## 2023-03-10 DIAGNOSIS — Z992 Dependence on renal dialysis: Secondary | ICD-10-CM | POA: Diagnosis not present

## 2023-03-10 DIAGNOSIS — N2581 Secondary hyperparathyroidism of renal origin: Secondary | ICD-10-CM | POA: Diagnosis not present

## 2023-03-11 ENCOUNTER — Ambulatory Visit: Payer: Medicare HMO | Admitting: Cardiology

## 2023-03-11 ENCOUNTER — Other Ambulatory Visit: Payer: Self-pay | Admitting: Cardiovascular Disease

## 2023-03-11 DIAGNOSIS — E785 Hyperlipidemia, unspecified: Secondary | ICD-10-CM

## 2023-03-11 DIAGNOSIS — I25118 Atherosclerotic heart disease of native coronary artery with other forms of angina pectoris: Secondary | ICD-10-CM

## 2023-03-12 DIAGNOSIS — N2581 Secondary hyperparathyroidism of renal origin: Secondary | ICD-10-CM | POA: Diagnosis not present

## 2023-03-12 DIAGNOSIS — Z992 Dependence on renal dialysis: Secondary | ICD-10-CM | POA: Diagnosis not present

## 2023-03-12 DIAGNOSIS — N186 End stage renal disease: Secondary | ICD-10-CM | POA: Diagnosis not present

## 2023-03-15 ENCOUNTER — Other Ambulatory Visit: Payer: Self-pay

## 2023-03-15 DIAGNOSIS — E785 Hyperlipidemia, unspecified: Secondary | ICD-10-CM

## 2023-03-15 DIAGNOSIS — N2581 Secondary hyperparathyroidism of renal origin: Secondary | ICD-10-CM | POA: Diagnosis not present

## 2023-03-15 DIAGNOSIS — I25118 Atherosclerotic heart disease of native coronary artery with other forms of angina pectoris: Secondary | ICD-10-CM

## 2023-03-15 DIAGNOSIS — Z992 Dependence on renal dialysis: Secondary | ICD-10-CM | POA: Diagnosis not present

## 2023-03-15 DIAGNOSIS — N186 End stage renal disease: Secondary | ICD-10-CM | POA: Diagnosis not present

## 2023-03-15 MED ORDER — ATORVASTATIN CALCIUM 80 MG PO TABS
80.0000 mg | ORAL_TABLET | Freq: Every day | ORAL | 0 refills | Status: DC
Start: 2023-03-15 — End: 2023-04-13

## 2023-03-15 MED ORDER — ASPIRIN 81 MG PO TBEC: 81.0000 mg | DELAYED_RELEASE_TABLET | Freq: Every morning | ORAL | 0 refills | Status: DC

## 2023-03-15 MED ORDER — NITROGLYCERIN 0.4 MG SL SUBL: 0.4000 mg | SUBLINGUAL_TABLET | SUBLINGUAL | 0 refills | Status: DC | PRN

## 2023-03-15 MED ORDER — CLOPIDOGREL BISULFATE 75 MG PO TABS
75.0000 mg | ORAL_TABLET | Freq: Every day | ORAL | 0 refills | Status: DC
Start: 2023-03-15 — End: 2023-04-13

## 2023-03-15 NOTE — Telephone Encounter (Signed)
Requested Prescriptions   Signed Prescriptions Disp Refills   aspirin EC 81 MG tablet 30 tablet 0    Sig: Take 1 tablet (81 mg total) by mouth in the morning. Swallow whole.    Authorizing Provider: Antonieta Iba    Ordering User: Guerry Minors   atorvastatin (LIPITOR) 80 MG tablet 30 tablet 0    Sig: Take 1 tablet (80 mg total) by mouth daily.    Authorizing Provider: Antonieta Iba    Ordering User: Guerry Minors   clopidogrel (PLAVIX) 75 MG tablet 30 tablet 0    Sig: Take 1 tablet (75 mg total) by mouth daily.    Authorizing Provider: Antonieta Iba    Ordering User: Guerry Minors   nitroGLYCERIN (NITROSTAT) 0.4 MG SL tablet 25 tablet 0    Sig: Place 1 tablet (0.4 mg total) under the tongue every 5 (five) minutes x 3 doses as needed for chest pain.    Authorizing Provider: Antonieta Iba    Ordering User: Guerry Minors

## 2023-03-17 DIAGNOSIS — Z992 Dependence on renal dialysis: Secondary | ICD-10-CM | POA: Diagnosis not present

## 2023-03-17 DIAGNOSIS — N2581 Secondary hyperparathyroidism of renal origin: Secondary | ICD-10-CM | POA: Diagnosis not present

## 2023-03-17 DIAGNOSIS — N186 End stage renal disease: Secondary | ICD-10-CM | POA: Diagnosis not present

## 2023-03-19 DIAGNOSIS — N2581 Secondary hyperparathyroidism of renal origin: Secondary | ICD-10-CM | POA: Diagnosis not present

## 2023-03-19 DIAGNOSIS — Z992 Dependence on renal dialysis: Secondary | ICD-10-CM | POA: Diagnosis not present

## 2023-03-19 DIAGNOSIS — N186 End stage renal disease: Secondary | ICD-10-CM | POA: Diagnosis not present

## 2023-03-22 DIAGNOSIS — N2581 Secondary hyperparathyroidism of renal origin: Secondary | ICD-10-CM | POA: Diagnosis not present

## 2023-03-22 DIAGNOSIS — Z992 Dependence on renal dialysis: Secondary | ICD-10-CM | POA: Diagnosis not present

## 2023-03-22 DIAGNOSIS — N186 End stage renal disease: Secondary | ICD-10-CM | POA: Diagnosis not present

## 2023-03-23 ENCOUNTER — Telehealth: Payer: Self-pay

## 2023-03-23 NOTE — Telephone Encounter (Signed)
Returned pt's wife's call. No answer-left vm for them to call us back if assistance is still needed.

## 2023-03-23 NOTE — Telephone Encounter (Signed)
Returned call to pt's wife in regards to bilateral UE swelling constant pain. Pt's wife stated that pt has been in constant pain unable to fully use hands and arms for about 3-4 months. She stated that her husband has not been able to sleep at night due to his arms going numb and pain. Denied color or temperature changes to hands or fingers. Stated that dialysis center has been notified on multiple occasions but stated "they say it because of his diabetes and they never do anything". Pt was not available (at work)  for me to triage symptoms effectively. S/w Marisue Humble, PA in office and she recommended pt see PCP or Urgent Care. 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 Chestine Spore and I was really hoping he could at least see him" Offered her Dr. Ophelia Charter first available appointment , she thanked me and was very Adult nurse.

## 2023-03-24 DIAGNOSIS — N2581 Secondary hyperparathyroidism of renal origin: Secondary | ICD-10-CM | POA: Diagnosis not present

## 2023-03-24 DIAGNOSIS — N186 End stage renal disease: Secondary | ICD-10-CM | POA: Diagnosis not present

## 2023-03-24 DIAGNOSIS — Z992 Dependence on renal dialysis: Secondary | ICD-10-CM | POA: Diagnosis not present

## 2023-03-26 DIAGNOSIS — N2581 Secondary hyperparathyroidism of renal origin: Secondary | ICD-10-CM | POA: Diagnosis not present

## 2023-03-26 DIAGNOSIS — N186 End stage renal disease: Secondary | ICD-10-CM | POA: Diagnosis not present

## 2023-03-26 DIAGNOSIS — Z992 Dependence on renal dialysis: Secondary | ICD-10-CM | POA: Diagnosis not present

## 2023-03-29 DIAGNOSIS — Z992 Dependence on renal dialysis: Secondary | ICD-10-CM | POA: Diagnosis not present

## 2023-03-29 DIAGNOSIS — N186 End stage renal disease: Secondary | ICD-10-CM | POA: Diagnosis not present

## 2023-03-29 DIAGNOSIS — N2581 Secondary hyperparathyroidism of renal origin: Secondary | ICD-10-CM | POA: Diagnosis not present

## 2023-03-30 DIAGNOSIS — Z992 Dependence on renal dialysis: Secondary | ICD-10-CM | POA: Diagnosis not present

## 2023-03-30 DIAGNOSIS — N2581 Secondary hyperparathyroidism of renal origin: Secondary | ICD-10-CM | POA: Diagnosis not present

## 2023-03-30 DIAGNOSIS — N186 End stage renal disease: Secondary | ICD-10-CM | POA: Diagnosis not present

## 2023-04-02 DIAGNOSIS — N186 End stage renal disease: Secondary | ICD-10-CM | POA: Diagnosis not present

## 2023-04-02 DIAGNOSIS — N2581 Secondary hyperparathyroidism of renal origin: Secondary | ICD-10-CM | POA: Diagnosis not present

## 2023-04-02 DIAGNOSIS — Z992 Dependence on renal dialysis: Secondary | ICD-10-CM | POA: Diagnosis not present

## 2023-04-03 DIAGNOSIS — Z992 Dependence on renal dialysis: Secondary | ICD-10-CM | POA: Diagnosis not present

## 2023-04-03 DIAGNOSIS — I129 Hypertensive chronic kidney disease with stage 1 through stage 4 chronic kidney disease, or unspecified chronic kidney disease: Secondary | ICD-10-CM | POA: Diagnosis not present

## 2023-04-03 DIAGNOSIS — N186 End stage renal disease: Secondary | ICD-10-CM | POA: Diagnosis not present

## 2023-04-05 DIAGNOSIS — N186 End stage renal disease: Secondary | ICD-10-CM | POA: Diagnosis not present

## 2023-04-05 DIAGNOSIS — N2581 Secondary hyperparathyroidism of renal origin: Secondary | ICD-10-CM | POA: Diagnosis not present

## 2023-04-05 DIAGNOSIS — Z992 Dependence on renal dialysis: Secondary | ICD-10-CM | POA: Diagnosis not present

## 2023-04-07 DIAGNOSIS — Z992 Dependence on renal dialysis: Secondary | ICD-10-CM | POA: Diagnosis not present

## 2023-04-07 DIAGNOSIS — N186 End stage renal disease: Secondary | ICD-10-CM | POA: Diagnosis not present

## 2023-04-07 DIAGNOSIS — N2581 Secondary hyperparathyroidism of renal origin: Secondary | ICD-10-CM | POA: Diagnosis not present

## 2023-04-09 DIAGNOSIS — N2581 Secondary hyperparathyroidism of renal origin: Secondary | ICD-10-CM | POA: Diagnosis not present

## 2023-04-09 DIAGNOSIS — Z992 Dependence on renal dialysis: Secondary | ICD-10-CM | POA: Diagnosis not present

## 2023-04-09 DIAGNOSIS — N186 End stage renal disease: Secondary | ICD-10-CM | POA: Diagnosis not present

## 2023-04-12 ENCOUNTER — Ambulatory Visit: Payer: Medicare HMO | Attending: Cardiology | Admitting: Physician Assistant

## 2023-04-12 ENCOUNTER — Encounter: Payer: Self-pay | Admitting: Physician Assistant

## 2023-04-12 ENCOUNTER — Telehealth: Payer: Self-pay | Admitting: Cardiovascular Disease

## 2023-04-12 VITALS — BP 145/76 | HR 67 | Ht 69.0 in | Wt 229.0 lb

## 2023-04-12 DIAGNOSIS — I10 Essential (primary) hypertension: Secondary | ICD-10-CM

## 2023-04-12 DIAGNOSIS — I739 Peripheral vascular disease, unspecified: Secondary | ICD-10-CM

## 2023-04-12 DIAGNOSIS — Z992 Dependence on renal dialysis: Secondary | ICD-10-CM

## 2023-04-12 DIAGNOSIS — E785 Hyperlipidemia, unspecified: Secondary | ICD-10-CM | POA: Diagnosis not present

## 2023-04-12 DIAGNOSIS — R0609 Other forms of dyspnea: Secondary | ICD-10-CM

## 2023-04-12 DIAGNOSIS — I25118 Atherosclerotic heart disease of native coronary artery with other forms of angina pectoris: Secondary | ICD-10-CM | POA: Diagnosis not present

## 2023-04-12 DIAGNOSIS — I2089 Other forms of angina pectoris: Secondary | ICD-10-CM

## 2023-04-12 DIAGNOSIS — N186 End stage renal disease: Secondary | ICD-10-CM

## 2023-04-12 DIAGNOSIS — R9439 Abnormal result of other cardiovascular function study: Secondary | ICD-10-CM | POA: Diagnosis not present

## 2023-04-12 DIAGNOSIS — T733XXA Exhaustion due to excessive exertion, initial encounter: Secondary | ICD-10-CM | POA: Diagnosis not present

## 2023-04-12 DIAGNOSIS — D638 Anemia in other chronic diseases classified elsewhere: Secondary | ICD-10-CM | POA: Diagnosis not present

## 2023-04-12 DIAGNOSIS — R9389 Abnormal findings on diagnostic imaging of other specified body structures: Secondary | ICD-10-CM | POA: Diagnosis not present

## 2023-04-12 DIAGNOSIS — N2581 Secondary hyperparathyroidism of renal origin: Secondary | ICD-10-CM | POA: Diagnosis not present

## 2023-04-12 NOTE — Progress Notes (Signed)
Cardiology Office Note    Date:  04/12/2023   ID:  Jeremy Dorsey, DOB 24-Jan-1951, MRN [id number]  PCP:  Ronnald Ramp, MD  Cardiologist:  Julien Nordmann, MD  Electrophysiologist:  None   Chief Complaint: Follow up  History of Present Illness:   Jeremy Dorsey is a 72 y.o. male with history of CAD with NSTEMI in 02/2020 with subsequent PCI to the LAD in 04/2020, ESRD on HD, DM2, PAD with right anterior tibial/dorsalis pedis and peroneal artery angioplasty in 01/2022, and right TP trunk/peroneal angioplasty in 06/2022 followed by vascular surgery, HTN, HLD, anemia of chronic disease, morbid obesity, and prior tobacco use who presents for follow-up of his CAD.  He was admitted in 02/2020 with an NSTEMI.  LHC was deferred at that time for concern for contrast-induced nephropathy.  Echo showed an EF of 50 to 55%, moderate hypokinesis of the basal inferior wall, grade 1 diastolic dysfunction, normal RV systolic function and ventricular cavity size, aortic valve sclerosis without evidence of stenosis, and an estimated right atrial pressure of 8 mmHg.  Lexiscan MPI at that time showed a potential small area of pharmacologically induced ischemia involving the apex of the LV and EF of 47%.  He subsequently underwent LHC in 04/2020 which showed severe one-vessel CAD involving the proximal LAD with 99% stenosis and 90% mid LAD stenosis.  He underwent successful IVUS guided PCI/DES to the proximal and mid LAD.  There was moderate LAD disease in the mid to distal segment left to be managed medically.  As part of prekidney transplant he has undergone cardiac imaging through [facility] with nuclear stress test in 04/2022 showing no evidence of ischemia with an LVEF of 45%.  Echo at that time showed an EF of 65% with normal LV diastolic function, normal RV systolic function with upper normal ventricular cavity size, trivial pericardial effusion without evidence of hemodynamic compromise, and no  significant valvular abnormality.  Stress cMRI in 08/2022 was abnormal with diffuse perfusion defect of all basal-mid LV segments extending beyond areas of scarring suggestive of mixed ischemia and infarction concerning for multivessel disease, LVEF 74%, moderate concentric LVH, normal RV systolic function and ventricular cavity size, mild MR and TR, and diffuse patchy subendocardial fibrosis possibly representing nonischemic scarring related to hypertensive heart disease versus areas of infarction.  He comes in accompanied by his wife today.  In the setting of the above abnormal stress MRI, he has been taken off the renal transplant list.  This was quite disheartening for him.  He denies any frank angina, though does have some exertional fatigue and dyspnea.  No significant lower extremity swelling, or progressive orthopnea.  No dizziness, presyncope, or syncope.  He remains adherent to cardiac medications.  No falls or symptoms concerning for bleeding.   Labs independently reviewed: 12/2022 - Hgb 9.4, PLT 166 potassium 3.8, BUN 61, serum creatinine 8.07 06/2022 - albumin 4.0, AST/ALT normal 12/2021 - A1c 6.9 12/2020 - TC 81, TG 89, HDL 25, LDL 39  Past Medical History:  Diagnosis Date   Anemia    Cancer (HCC)    basal cell nose and forehead   Cataract 2016   Cataract surgery bith eyes   CKD (chronic kidney disease), stage IV (HCC)    Coronary artery disease    a. s/p IVUS-guided DESx2 to prox & mid LAD, residual disease treated medically. EF 50-55% by recent echo 02/2020.   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   Urinary complication     Past Surgical History:  Procedure Laterality Date   ABDOMINAL AORTOGRAM W/LOWER EXTREMITY N/A 01/29/2022   Procedure: ABDOMINAL AORTOGRAM W/ Bilateral LOWER EXTREMITY [place];  Surgeon: Cephus Shelling, MD;  Location: [facility];  Service: Cardiovascular;  Laterality: N/A;   ABDOMINAL AORTOGRAM W/LOWER EXTREMITY N/A 06/11/2022   Procedure: ABDOMINAL AORTOGRAM W/LOWER EXTREMITY;  Surgeon: Cephus Shelling, MD;  Location: [facility];  Service: Cardiovascular;  Laterality: N/A;   AV FISTULA PLACEMENT Left 10/31/2020   Procedure: LEFT UPPER EXTREMITY ARTERIOVENOUS (AV) FISTULA CREATION;  Surgeon: Maeola Harman, MD;  Location: [facility];  Service: Vascular;  Laterality: Left;   AV FISTULA PLACEMENT Left 07/09/2021   Procedure: LEFT ARM ARTERIOVENOUS (AV) FISTULA CREATION;  Surgeon: Nada Libman, MD;  Location: [facility];  Service: Vascular;  Laterality: Left;   COLONOSCOPY  1990   COLONOSCOPY  06/09/2011   Dr Lemar Livings   CORONARY STENT INTERVENTION N/A 04/24/2020   Procedure: CORONARY STENT INTERVENTION;  Surgeon: Iran Ouch, MD;  Location: [facility];  Service: Cardiovascular;  Laterality: N/A;   CORONARY ULTRASOUND/IVUS N/A 04/24/2020   Procedure: Intravascular Ultrasound/IVUS;  Surgeon: Iran Ouch, MD;  Location: [facility];  Service: Cardiovascular;  Laterality: N/A;   CYSTOSCOPY W/ RETROGRADES Bilateral 08/01/2018   Procedure: CYSTOSCOPY WITH RETROGRADE PYELOGRAM;  Surgeon: Sondra Come, MD;  Location: [facility];  Service: Urology;  Laterality: Bilateral;   CYSTOSCOPY WITH BIOPSY N/A 08/01/2018   Procedure: CYSTOSCOPY WITH Bladder BIOPSY;  Surgeon: Sondra Come, MD;  Location: [facility];  Service: Urology;  Laterality: N/A;   CYSTOSCOPY WITH STENT PLACEMENT Right 08/01/2018   Procedure: CYSTOSCOPY WITH STENT PLACEMENT;  Surgeon: Sondra Come, MD;  Location: [facility];  Service: Urology;  Laterality: Right;   EYE SURGERY Right    laser surgery   FRACTURE SURGERY     Rt shoulder rotaor cuff   LEFT HEART CATH AND CORONARY ANGIOGRAPHY N/A 04/24/2020   Procedure: LEFT HEART CATH AND CORONARY ANGIOGRAPHY;  Surgeon:  Iran Ouch, MD;  Location: [facility];  Service: Cardiovascular;  Laterality: N/A;   PERIPHERAL VASCULAR BALLOON ANGIOPLASTY Right 01/29/2022   Procedure: PERIPHERAL VASCULAR BALLOON ANGIOPLASTY;  Surgeon: Cephus Shelling, MD;  Location: [facility];  Service: Cardiovascular;  Laterality: Right;   PERIPHERAL VASCULAR BALLOON ANGIOPLASTY Right 06/11/2022   Procedure: PERIPHERAL VASCULAR BALLOON ANGIOPLASTY;  Surgeon: Cephus Shelling, MD;  Location: [facility];  Service: Cardiovascular;  Laterality: Right;  Peroneal   SHOULDER ARTHROSCOPY WITH OPEN ROTATOR CUFF REPAIR Right 08/25/2018   Procedure: SHOULDER ARTHROSCOPY WITH MINI OPEN ROTATOR CUFF REPAIR;  Surgeon: Juanell Fairly, MD;  Location: [facility];  Service: Orthopedics;  Laterality: Right;   TRANSURETHRAL RESECTION OF BLADDER TUMOR N/A 08/01/2018   Procedure: TRANSURETHRAL RESECTION OF BLADDER TUMOR (TURBT);  Surgeon: Sondra Come, MD;  Location: [facility];  Service: Urology;  Laterality: N/A;   URETEROSCOPY WITH HOLMIUM LASER LITHOTRIPSY Right 08/01/2018   Procedure: URETEROSCOPY WITH HOLMIUM LASER LITHOTRIPSY;  Surgeon: Sondra Come, MD;  Location: [facility];  Service: Urology;  Laterality: Right;    Current Medications: Current Meds  Medication Sig   amLODipine (NORVASC) 5 MG tablet Take 5 mg by mouth every  evening.   aspirin EC 81 MG tablet Take 1 tablet (81 mg total) by mouth in the morning. Swallow whole.   atorvastatin (LIPITOR) 80 MG tablet Take 1 tablet (80 mg total) by mouth daily.   AURYXIA 1 GM 210 MG(Fe) tablet Take by mouth.   calcitRIOL (ROCALTROL) 0.25 MCG capsule Take 0.25 mcg by mouth in the morning.   carvedilol (COREG) 12.5 MG tablet Take 1 tablet by mouth 2 (two) times daily.   cinacalcet (SENSIPAR) 30 MG tablet PLEASE SEE ATTACHED FOR DETAILED DIRECTIONS   clopidogrel (PLAVIX) 75 MG tablet Take 1 tablet (75 mg total) by mouth daily.   lidocaine-prilocaine (EMLA) cream  Apply 1 Application topically daily as needed (prior to port being accessed.).   metoprolol tartrate (LOPRESSOR) 25 MG tablet Take 1 tablet by mouth 2 (two) times daily.   multivitamin (RENA-VIT) TABS tablet Take 1 tablet by mouth daily.   nitroGLYCERIN (NITROSTAT) 0.4 MG SL tablet Place 1 tablet (0.4 mg total) under the tongue every 5 (five) minutes x 3 doses as needed for chest pain.   sevelamer carbonate (RENVELA) 800 MG tablet Take 800 mg by mouth with breakfast, with lunch, and with evening meal.   sildenafil (VIAGRA) 100 MG tablet Take 100 mg by mouth as needed for erectile dysfunction.   torsemide (DEMADEX) 100 MG tablet Take 100 mg by mouth every morning.   Current Facility-Administered Medications for the 04/12/23 encounter (Office Visit) with Sondra Barges, PA-C  Medication   0.9 %  sodium chloride infusion   0.9 %  sodium chloride infusion   sodium chloride flush (NS) 0.9 % injection 3 mL   sodium chloride flush (NS) 0.9 % injection 3 mL   sodium chloride flush (NS) 0.9 % injection 3 mL   sodium chloride flush (NS) 0.9 % injection 3 mL    Allergies:   Oxycodone and Hydrocodone   Social History   Socioeconomic History   Marital status: Married    Spouse name: Not on file   Number of children: 3   Years of education: Not on file   Highest education level: Bachelor's degree (e.g., BA, AB, BS)  Occupational History    Comment: retired  Tobacco Use   Smoking status: Former    Current packs/day: 0.00    Average packs/day: 1 pack/day for 30.0 years (30.0 ttl pk-yrs)    Types: Cigarettes    Start date: 08/22/1977    Quit date: 08/23/2007    Years since quitting: 15.6    Passive exposure: Past   Smokeless tobacco: Never  Vaping Use   Vaping status: Never Used  Substance and Sexual Activity   Alcohol use: No   Drug use: No   Sexual activity: Not Currently    Birth control/protection: None  Other Topics Concern   Not on file  Social History Narrative   Not on file    Social Determinants of Health   Financial Resource Strain: Patient Declined (03/02/2023)   Overall Financial Resource Strain (CARDIA)    Difficulty of Paying Living Expenses: Patient declined  Food Insecurity: Patient Declined (03/02/2023)   Hunger Vital Sign    Worried About Running Out of Food in the Last Year: Patient declined    Ran Out of Food in the Last Year: Patient declined  Transportation Needs: Patient Declined (03/02/2023)   PRAPARE - Administrator, Civil Service (Medical): Patient declined    Lack of Transportation (Non-Medical): Patient declined  Physical Activity: Patient Declined (03/02/2023)  Exercise Vital Sign    Days of Exercise per Week: Patient declined    Minutes of Exercise per Session: Patient declined  Stress: Patient Declined (03/02/2023)   [employer] of Occupational Health - Occupational Stress Questionnaire    Feeling of Stress : Patient declined  Social Connections: Unknown (03/02/2023)   Social Connection and Isolation Panel [NHANES]    Frequency of Communication with Friends and Family: Patient declined    Frequency of Social Gatherings with Friends and Family: Patient declined    Attends Religious Services: Not on Marketing executive or Organizations: Patient declined    Attends Banker Meetings: Patient declined    Marital Status: Patient declined     Family History:  The patient's family history includes Heart failure in his father; Psoriasis in his mother.  ROS:   12-point review of systems is negative unless otherwise noted in the HPI.   EKGs/Labs/Other Studies Reviewed:    Studies reviewed were summarized above. The additional studies were reviewed today:  Stress cMRI of 08/06/2022 ([facility]): 1. Abnormal stress perfusion study. Diffuse perfusion defect of all basal-mid LV segments extending beyond  areas of scarring suggestive of mixed ischemia and infarction; given diffuse distribution involving  all  basal-mid LV segments, this study is suggestive of multivessel disease.  2. Normal LV size and systolic function; LVEF 74%. Moderate concentric hypertrophy with increased LV mass  125g-m2.  3. Normal RV size and systolic function; RVEF 74%.  4. Mild mitral and tricuspid regurgitation.  5. Diffuse patchy subendocardial fibrosis of all basal-mid LV segments - this may represent non-ischemic  scarring related to hypertensive heart disease as well as areas of infarction; LV scar size ~22%. ECV in  areas without scar ~33% consistent with mild diffuse interstitial fibrosis.  __________  Eugenie Birks MPI 04/29/2022 ([facility]):   ECG test result was negative for evidence of ischemia. There was no  chest pain during the exam.    Myocardial perfusion imaging test is normal.    Overall left ventricular systolic function was mildly reduced. The  calculated ejection fraction was measured at 45%.    Low cardiovascular risk    No prior study.  __________  2D echo 04/29/2022 ([facility]): 1. Left ventricle: The left ventricular cavity size is normal. Wall     thickness is normal. Systolic function is normal. The ejection fraction     is 65% (+/-5%), estimated visually. Overall assessment of diastolic     function is normal with normal estimate of left atrial pressure.  2. Right ventricle: The right ventricular cavity size is at the upper limits     of normal. Systolic function is normal as estimated by visual and     quantitative measures.  3. Pericardium, extracardiac: A trivial pericardial effusion is identified.     There is no evidence of hemodynamic compromise.  4. No significant valve stenosis or regurgitation.  __________  LHC 04/24/2020: Ost RCA to Prox RCA lesion is 30% stenosed. Ost LAD to Prox LAD lesion is 40% stenosed. Prox LAD to Mid LAD lesion is 99% stenosed. Post intervention, there is a 0% residual stenosis. A drug-eluting stent was successfully placed using a STENT RESOLUTE ONYX  4.0X15. Mid LAD lesion is 90% stenosed. Post intervention, there is a 0% residual stenosis. A drug-eluting stent was successfully placed using a STENT RESOLUTE ONYX 3.0X15. Mid LAD to Dist LAD lesion is 60% stenosed. Dist LAD lesion is 30% stenosed.   1.  Left dominant  coronary arteries with severe one-vessel coronary artery disease involving proximal LAD with 99% stenosis and mid LAD with 90% stenosis.  There is moderate LAD disease in the mid to distal segment that was left to be treated medically. 2.  Left ventricular angiography was not performed due to chronic kidney disease.  LVEDP was moderately elevated at 25 mmHg. 3.  Successful IVUS guided PCI and drug-eluting stent placement to the mid and proximal LAD.   Recommendations: Dual antiplatelet therapy for at least 1 year. Aggressive treatment of risk factors. Given the patient's advanced chronic kidney disease, I am going to observe him overnight with hydration.  Recheck renal function in the morning.  A total of 80 mL of contrast was used for the procedure. __________  Eugenie Birks MPI 03/02/2020: IMPRESSION: 1. Potential small area of pharmacologically induced ischemia involving the apex of the left ventricle. Clinical correlation is advised. 2. No scintigraphic evidence of prior infarction. 3. Mild global hypokinesia, slightly more conspicuously involving the basilar aspect of the inferior wall of the left ventricle. 4. Ejection fraction - 47%. __________  2D echo 03/01/2020: 1. Left ventricular ejection fraction, by estimation, is 50 to 55%. The  left ventricle has low normal function. The left ventricle demonstrates  regional wall motion abnormalities (see scoring diagram/findings for  description). Left ventricular diastolic   parameters are consistent with Grade I diastolic dysfunction (impaired  relaxation). There is moderate hypokinesis of the left ventricular, basal  inferior wall.   2. Right ventricular systolic  function is normal. The right ventricular  size is normal.   3. The mitral valve is normal in structure. No evidence of mitral valve  regurgitation.   4. The aortic valve is normal in structure. Aortic valve regurgitation is  not visualized. Mild aortic valve sclerosis is present, with no evidence  of aortic valve stenosis.   5. The inferior vena cava is dilated in size with >50% respiratory  variability, suggesting right atrial pressure of 8 mmHg.     EKG:  EKG is ordered today.  The EKG ordered today demonstrates NSR, 67 bpm, first-degree AV block, poor R wave progression along the precordial leads, nonspecific lateral ST-T changes  Recent Labs: 06/15/2022: ALT 26 12/23/2022: BUN 61; Creatinine, Ser 8.07; Hemoglobin 9.4; Platelets 166; Potassium 3.8; Sodium 136  Recent Lipid Panel    Component Value Date/Time   CHOL 81 12/12/2020 0000   TRIG 89 12/12/2020 0000   HDL 25 (A) 12/12/2020 0000   CHOLHDL 4.9 03/02/2020 0111   VLDL 21 03/02/2020 0111   LDLCALC 39 12/12/2020 0000    PHYSICAL EXAM:    VS:  BP (!) 145/76 (BP Location: Left Arm, Patient Position: Sitting, Cuff Size: Normal)   Pulse 67   Ht 5\' 9"  (1.753 m)   Wt 229 lb (103.9 kg)   SpO2 96%   BMI 33.82 kg/m   BMI: Body mass index is 33.82 kg/m.  Physical Exam Vitals reviewed.  Constitutional:      Appearance: He is well-developed.  HENT:     Head: Normocephalic and atraumatic.  Eyes:     General:        Right eye: No discharge.        Left eye: No discharge.  Cardiovascular:     Rate and Rhythm: Normal rate and regular rhythm.     Heart sounds: Normal heart sounds, S1 normal and S2 normal. Heart sounds not distant. No midsystolic click and no opening snap. No murmur heard.    No  friction rub.  Pulmonary:     Effort: Pulmonary effort is normal. No respiratory distress.     Breath sounds: Normal breath sounds. No decreased breath sounds, wheezing or rales.  Chest:     Chest wall: No tenderness.  Abdominal:      General: There is no distension.  Musculoskeletal:     Cervical back: Normal range of motion.     Right lower leg: No edema.     Left lower leg: No edema.  Skin:    General: Skin is warm and dry.     Nails: There is no clubbing.  Neurological:     Mental Status: He is alert and oriented to person, place, and time.  Psychiatric:        Speech: Speech normal.        Behavior: Behavior normal.        Thought Content: Thought content normal.        Judgment: Judgment normal.     Wt Readings from Last 3 Encounters:  04/12/23 229 lb (103.9 kg)  03/03/23 227 lb (103 kg)  02/18/23 227 lb 1.2 oz (103 kg)     ASSESSMENT & PLAN:   CAD involving the native coronary arteries with exertional dyspnea concerning for anginal equivalent and abnormal stress cMRI: Currently without symptoms of angina or cardiac decompensation.  Stress MRI through outside institution in 08/2022 abnormal with findings suggestive of multivessel disease.  In this setting he has been taken off the renal transplant list.  Schedule LHC for further evaluation.  Remains on aspirin, clopidogrel, amlodipine, atorvastatin, and carvedilol.  Medication list also indicates he is taking metoprolol.  Further clarification of this will be needed in follow-up.  Aggressive risk factor modification.  HTN: Blood pressure is mildly elevated in the office today.  Remains on amlodipine and carvedilol (? metoprolol).  Follow-up after cardiac cath.  HLD: LDL 39 in 12/2020.  Remains on atorvastatin 80 mg.  Check CMP and lipid panel.  PAD: Status post intervention.  Followed by vascular surgery, follow-up as directed.  ESRD on HD with anemia of chronic disease: Per nephrology.     Informed Consent   Shared Decision Making/Informed Consent{  The risks [stroke (1 in 1000), death (1 in 1000), kidney failure [usually temporary] (1 in 500), bleeding (1 in 200), allergic reaction [possibly serious] (1 in 200)], benefits (diagnostic support and  management of coronary artery disease) and alternatives of a cardiac catheterization were discussed in detail with Jeremy Dorsey and he is willing to proceed.        Disposition: F/u with Dr. Mariah Milling or an APP 1-2 weeks after LHC   Medication Adjustments/Labs and Tests Ordered: Current medicines are reviewed at length with the patient today.  Concerns regarding medicines are outlined above. Medication changes, Labs and Tests ordered today are summarized above and listed in the Patient Instructions accessible in Encounters.   Signed, Eula Listen, PA-C 04/12/2023 4:36 PM     [facility] [place] [address] [place], [place] [address] [phone]

## 2023-04-12 NOTE — Telephone Encounter (Signed)
Pt's wife would like a callback regarding pt's upcoming procedure and when is he'll be able to complete Dialysis. Please advise

## 2023-04-12 NOTE — Patient Instructions (Addendum)
Medication Instructions:  Your Physician recommend you continue on your current medication as directed.    *If you need a refill on your cardiac medications before your next appointment, please call your pharmacy*   Lab Work: Your provider would like for you to have following labs drawn today CBC, Lipid panel and CMET.   If you have labs (blood work) drawn today and your tests are completely normal, you will receive your results only by: MyChart Message (if you have MyChart) OR A paper copy in the mail If you have any lab test that is abnormal or we need to change your treatment, we will call you to review the results.   Testing/Procedures:  You are scheduled for a Left Cardiac Catheterization on Friday, September 13 with Dr. Lorine Bears.  1. Please arrive at the [facility] Entrance of [facility], [address], [place] [address] at 10:00 AM (This is 1 hour(s) prior to your procedure time).  Proceed to the Check-In Desk directly inside the entrance.  Procedure Parking: Use the entrance off of the [address] side of the hospital. Turn right upon entering and follow the driveway to parking that is directly in front of the [facility]. There is no valet parking available at this entrance, however there is an awning directly in front of the [facility] for drop off/ pick up for patients.  Special note: Every effort is made to have your procedure done on time. Please understand that emergencies sometimes delay scheduled procedures.  2. Diet: Do not eat solid foods after midnight.  The patient may have clear liquids until 5am upon the day of the procedure.  3. Labs: You will have blood drawn today  4. Medication instructions in preparation for your procedure:   Contrast Allergy: No   Current Outpatient Medications (Endocrine & Metabolic):    calcitRIOL (ROCALTROL) 0.25 MCG capsule, Take 0.25 mcg by mouth in the morning.   cinacalcet (SENSIPAR)  30 MG tablet, PLEASE SEE ATTACHED FOR DETAILED DIRECTIONS   Current Outpatient Medications (Cardiovascular):    amLODipine (NORVASC) 5 MG tablet, Take 5 mg by mouth every evening.   atorvastatin (LIPITOR) 80 MG tablet, Take 1 tablet (80 mg total) by mouth daily.   carvedilol (COREG) 12.5 MG tablet, Take 1 tablet by mouth 2 (two) times daily.   metoprolol tartrate (LOPRESSOR) 25 MG tablet, Take 1 tablet by mouth 2 (two) times daily.   nitroGLYCERIN (NITROSTAT) 0.4 MG SL tablet, Place 1 tablet (0.4 mg total) under the tongue every 5 (five) minutes x 3 doses as needed for chest pain.   sildenafil (VIAGRA) 100 MG tablet, Take 100 mg by mouth as needed for erectile dysfunction.   torsemide (DEMADEX) 100 MG tablet, Take 100 mg by mouth every morning.     Current Outpatient Medications (Analgesics):    aspirin EC 81 MG tablet, Take 1 tablet (81 mg total) by mouth in the morning. Swallow whole.   Current Outpatient Medications (Hematological):    clopidogrel (PLAVIX) 75 MG tablet, Take 1 tablet (75 mg total) by mouth daily.   Current Outpatient Medications (Other):    AURYXIA 1 GM 210 MG(Fe) tablet, Take by mouth.   lidocaine-prilocaine (EMLA) cream, Apply 1 Application topically daily as needed (prior to port being accessed.).   multivitamin (RENA-VIT) TABS tablet, Take 1 tablet by mouth daily.   sevelamer carbonate (RENVELA) 800 MG tablet, Take 800 mg by mouth with breakfast, with lunch, and with evening  meal.  Current Facility-Administered Medications (Other):    0.9 %  sodium chloride infusion   0.9 %  sodium chloride infusion   sodium chloride flush (NS) 0.9 % injection 3 mL   sodium chloride flush (NS) 0.9 % injection 3 mL   sodium chloride flush (NS) 0.9 % injection 3 mL   sodium chloride flush (NS) 0.9 % injection 3 mL *For reference purposes while preparing patient instructions.   Delete this med list prior to printing instructions for patient.*   Stop taking, Torsemide  (Demadex) Wednesday, September 11.  On the morning of your procedure, take your Aspirin 81 mg and Plavix/Clopidogrel and any morning medicines NOT listed above.  You may use sips of water.  5. Plan to go home the same day, you will only stay overnight if medically necessary. 6. Bring a current list of your medications and current insurance cards. 7. You MUST have a responsible person to drive you home. 8. Someone MUST be with you the first 24 hours after you arrive home or your discharge will be delayed. 9. Please wear clothes that are easy to get on and off and wear slip-on shoes.  Thank you for allowing [place] to care for you!   -- [facility]    Follow-Up: At [facility], you and your health needs are our priority.  As part of our continuing mission to provide you with exceptional heart care, we have created designated Provider Care Teams.  These Care Teams include your primary Cardiologist (physician) and Advanced Practice Providers (APPs -  Physician Assistants and Nurse Practitioners) who all work together to provide you with the care you need, when you need it.  We recommend signing up for the patient portal called "MyChart".  Sign up information is provided on this After Visit Summary.  MyChart is used to connect with patients for Virtual Visits (Telemedicine).  Patients are able to view lab/test results, encounter notes, upcoming appointments, etc.  Non-urgent messages can be sent to your provider as well.   To learn more about what you can do with MyChart, go to [email or web address].    Your next appointment:   2 week(s) after your cath on 13 September  Provider:   You may see Julien Nordmann, MD or one of the following Advanced Practice Providers on your designated Care Team:   Eula Listen, [place]

## 2023-04-12 NOTE — Telephone Encounter (Signed)
MSG left for pt to call back

## 2023-04-12 NOTE — H&P (View-Only) (Signed)
Cardiology Office Note    Date:  04/12/2023   ID:  JEHIEL SPRATLEY, DOB [birth year], MRN [id number]  PCP:  Ronnald Ramp, MD  Cardiologist:  Julien Nordmann, MD  Electrophysiologist:  None   Chief Complaint: Follow up  History of Present Illness:   Jeremy Dorsey is a 72 y.o. male with history of CAD with NSTEMI in 02/2020 with subsequent PCI to the LAD in 04/2020, ESRD on HD, DM2, PAD with right anterior tibial/dorsalis pedis and peroneal artery angioplasty in 01/2022, and right TP trunk/peroneal angioplasty in 06/2022 followed by vascular surgery, HTN, HLD, anemia of chronic disease, morbid obesity, and prior tobacco use who presents for follow-up of his CAD.  He was admitted in 02/2020 with an NSTEMI.  LHC was deferred at that time for concern for contrast-induced nephropathy.  Echo showed an EF of 50 to 55%, moderate hypokinesis of the basal inferior wall, grade 1 diastolic dysfunction, normal RV systolic function and ventricular cavity size, aortic valve sclerosis without evidence of stenosis, and an estimated right atrial pressure of 8 mmHg.  Lexiscan MPI at that time showed a potential small area of pharmacologically induced ischemia involving the apex of the LV and EF of 47%.  He subsequently underwent LHC in 04/2020 which showed severe one-vessel CAD involving the proximal LAD with 99% stenosis and 90% mid LAD stenosis.  He underwent successful IVUS guided PCI/DES to the proximal and mid LAD.  There was moderate LAD disease in the mid to distal segment left to be managed medically.  As part of prekidney transplant he has undergone cardiac imaging through [facility] with nuclear stress test in 04/2022 showing no evidence of ischemia with an LVEF of 45%.  Echo at that time showed an EF of 65% with normal LV diastolic function, normal RV systolic function with upper normal ventricular cavity size, trivial pericardial effusion without evidence of hemodynamic compromise, and no  significant valvular abnormality.  Stress cMRI in 08/2022 was abnormal with diffuse perfusion defect of all basal-mid LV segments extending beyond areas of scarring suggestive of mixed ischemia and infarction concerning for multivessel disease, LVEF 74%, moderate concentric LVH, normal RV systolic function and ventricular cavity size, mild MR and TR, and diffuse patchy subendocardial fibrosis possibly representing nonischemic scarring related to hypertensive heart disease versus areas of infarction.  He comes in accompanied by his wife today.  In the setting of the above abnormal stress MRI, he has been taken off the renal transplant list.  This was quite disheartening for him.  He denies any frank angina, though does have some exertional fatigue and dyspnea.  No significant lower extremity swelling, or progressive orthopnea.  No dizziness, presyncope, or syncope.  He remains adherent to cardiac medications.  No falls or symptoms concerning for bleeding.   Labs independently reviewed: 12/2022 - Hgb 9.4, PLT 166 potassium 3.8, BUN 61, serum creatinine 8.07 06/2022 - albumin 4.0, AST/ALT normal 12/2021 - A1c 6.9 12/2020 - TC 81, TG 89, HDL 25, LDL 39  Past Medical History:  Diagnosis Date   Anemia    Cancer (HCC)    basal cell nose and forehead   Cataract 2016   Cataract surgery bith eyes   CKD (chronic kidney disease), stage IV (HCC)    Coronary artery disease    a. s/p IVUS-guided DESx2 to prox & mid LAD, residual disease treated medically. EF 50-55% by recent echo 02/2020.   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   Urinary complication     Past Surgical History:  Procedure Laterality Date   ABDOMINAL AORTOGRAM W/LOWER EXTREMITY N/A 01/29/2022   Procedure: ABDOMINAL AORTOGRAM W/ Bilateral LOWER EXTREMITY [place];  Surgeon: Cephus Shelling, MD;  Location: [facility];  Service: Cardiovascular;  Laterality: N/A;   ABDOMINAL AORTOGRAM W/LOWER EXTREMITY N/A 06/11/2022   Procedure: ABDOMINAL AORTOGRAM W/LOWER EXTREMITY;  Surgeon: Cephus Shelling, MD;  Location: [facility];  Service: Cardiovascular;  Laterality: N/A;   AV FISTULA PLACEMENT Left 10/31/2020   Procedure: LEFT UPPER EXTREMITY ARTERIOVENOUS (AV) FISTULA CREATION;  Surgeon: Maeola Harman, MD;  Location[facility];  Service: Vascular;  Laterality: Left;   AV FISTULA PLACEMENT Left 07/09/2021   Procedure: LEFT ARM ARTERIOVENOUS (AV) FISTULA CREATION;  Surgeon: Nada Libman, MD;  Location: [facility] OR;  Service: Vascular;  Laterality: Left;   COLONOSCOPY  1990   COLONOSCOPY  06/09/2011   Dr Lemar Livings   CORONARY STENT INTERVENTION N/A 04/24/2020   Procedure: CORONARY STENT INTERVENTION;  Surgeon: Iran Ouch, MD;  Location: [facility];  Service: Cardiovascular;  Laterality: N/A;   CORONARY ULTRASOUND/IVUS N/A 04/24/2020   Procedure: Intravascular Ultrasound/IVUS;  Surgeon: Iran Ouch, MD;  Location: [facility];  Service: Cardiovascular;  Laterality: N/A;   CYSTOSCOPY W/ RETROGRADES Bilateral 08/01/2018   Procedure: CYSTOSCOPY WITH RETROGRADE PYELOGRAM;  Surgeon: Sondra Come, MD;  Location: [facility];  Service: Urology;  Laterality: Bilateral;   CYSTOSCOPY WITH BIOPSY N/A 08/01/2018   Procedure: CYSTOSCOPY WITH Bladder BIOPSY;  Surgeon: Sondra Come, MD;  Location: [facility];  Service: Urology;  Laterality: N/A;   CYSTOSCOPY WITH STENT PLACEMENT Right 08/01/2018   Procedure: CYSTOSCOPY WITH STENT PLACEMENT;  Surgeon: Sondra Come, MD;  Location: [facility];  Service: Urology;  Laterality: Right;   EYE SURGERY Right    laser surgery   FRACTURE SURGERY     Rt shoulder rotaor cuff   LEFT HEART CATH AND CORONARY ANGIOGRAPHY N/A 04/24/2020   Procedure: LEFT HEART CATH AND CORONARY ANGIOGRAPHY;  Surgeon:  Iran Ouch, MD;  Location: [facility];  Service: Cardiovascular;  Laterality: N/A;   PERIPHERAL VASCULAR BALLOON ANGIOPLASTY Right 01/29/2022   Procedure: PERIPHERAL VASCULAR BALLOON ANGIOPLASTY;  Surgeon: Cephus Shelling, MD;  Location: [facility];  Service: Cardiovascular;  Laterality: Right;   PERIPHERAL VASCULAR BALLOON ANGIOPLASTY Right 06/11/2022   Procedure: PERIPHERAL VASCULAR BALLOON ANGIOPLASTY;  Surgeon: Cephus Shelling, MD;  Location: [facility];  Service: Cardiovascular;  Laterality: Right;  Peroneal   SHOULDER ARTHROSCOPY WITH OPEN ROTATOR CUFF REPAIR Right 08/25/2018   Procedure: SHOULDER ARTHROSCOPY WITH MINI OPEN ROTATOR CUFF REPAIR;  Surgeon: Juanell Fairly, MD;  Location: [facility];  Service: Orthopedics;  Laterality: Right;   TRANSURETHRAL RESECTION OF BLADDER TUMOR N/A 08/01/2018   Procedure: TRANSURETHRAL RESECTION OF BLADDER TUMOR (TURBT);  Surgeon: Sondra Come, MD;  Location: [facility];  Service: Urology;  Laterality: N/A;   URETEROSCOPY WITH HOLMIUM LASER LITHOTRIPSY Right 08/01/2018   Procedure: URETEROSCOPY WITH HOLMIUM LASER LITHOTRIPSY;  Surgeon: Sondra Come, MD;  Location: [facility];  Service: Urology;  Laterality: Right;    Current Medications: Current Meds  Medication Sig   amLODipine (NORVASC) 5 MG tablet Take 5 mg by mouth every  evening.   aspirin EC 81 MG tablet Take 1 tablet (81 mg total) by mouth in the morning. Swallow whole.   atorvastatin (LIPITOR) 80 MG tablet Take 1 tablet (80 mg total) by mouth daily.   AURYXIA 1 GM 210 MG(Fe) tablet Take by mouth.   calcitRIOL (ROCALTROL) 0.25 MCG capsule Take 0.25 mcg by mouth in the morning.   carvedilol (COREG) 12.5 MG tablet Take 1 tablet by mouth 2 (two) times daily.   cinacalcet (SENSIPAR) 30 MG tablet PLEASE SEE ATTACHED FOR DETAILED DIRECTIONS   clopidogrel (PLAVIX) 75 MG tablet Take 1 tablet (75 mg total) by mouth daily.   lidocaine-prilocaine (EMLA) cream  Apply 1 Application topically daily as needed (prior to port being accessed.).   metoprolol tartrate (LOPRESSOR) 25 MG tablet Take 1 tablet by mouth 2 (two) times daily.   multivitamin (RENA-VIT) TABS tablet Take 1 tablet by mouth daily.   nitroGLYCERIN (NITROSTAT) 0.4 MG SL tablet Place 1 tablet (0.4 mg total) under the tongue every 5 (five) minutes x 3 doses as needed for chest pain.   sevelamer carbonate (RENVELA) 800 MG tablet Take 800 mg by mouth with breakfast, with lunch, and with evening meal.   sildenafil (VIAGRA) 100 MG tablet Take 100 mg by mouth as needed for erectile dysfunction.   torsemide (DEMADEX) 100 MG tablet Take 100 mg by mouth every morning.   Current Facility-Administered Medications for the 04/12/23 encounter (Office Visit) with Sondra Barges, PA-C  Medication   0.9 %  sodium chloride infusion   0.9 %  sodium chloride infusion   sodium chloride flush (NS) 0.9 % injection 3 mL   sodium chloride flush (NS) 0.9 % injection 3 mL   sodium chloride flush (NS) 0.9 % injection 3 mL   sodium chloride flush (NS) 0.9 % injection 3 mL    Allergies:   Oxycodone and Hydrocodone   Social History   Socioeconomic History   Marital status: Married    Spouse name: Not on file   Number of children: 3   Years of education: Not on file   Highest education level: Bachelor's degree (e.g., BA, AB, BS)  Occupational History    Comment: retired  Tobacco Use   Smoking status: Former    Current packs/day: 0.00    Average packs/day: 1 pack/day for 30.0 years (30.0 ttl pk-yrs)    Types: Cigarettes    Start date: 08/22/1977    Quit date: 08/23/2007    Years since quitting: 15.6    Passive exposure: Past   Smokeless tobacco: Never  Vaping Use   Vaping status: Never Used  Substance and Sexual Activity   Alcohol use: No   Drug use: No   Sexual activity: Not Currently    Birth control/protection: None  Other Topics Concern   Not on file  Social History Narrative   Not on file    Social Determinants of Health   Financial Resource Strain: Patient Declined (03/02/2023)   Overall Financial Resource Strain (CARDIA)    Difficulty of Paying Living Expenses: Patient declined  Food Insecurity: Patient Declined (03/02/2023)   Hunger Vital Sign    Worried About Running Out of Food in the Last Year: Patient declined    Ran Out of Food in the Last Year: Patient declined  Transportation Needs: Patient Declined (03/02/2023)   PRAPARE - Administrator, Civil Service (Medical): Patient declined    Lack of Transportation (Non-Medical): Patient declined  Physical Activity: Patient Declined (03/02/2023)  Exercise Vital Sign    Days of Exercise per Week: Patient declined    Minutes of Exercise per Session: Patient declined  Stress: Patient Declined (03/02/2023)   [employer] of Occupational Health - Occupational Stress Questionnaire    Feeling of Stress : Patient declined  Social Connections: Unknown (03/02/2023)   Social Connection and Isolation Panel [NHANES]    Frequency of Communication with Friends and Family: Patient declined    Frequency of Social Gatherings with Friends and Family: Patient declined    Attends Religious Services: Not on Marketing executive or Organizations: Patient declined    Attends Banker Meetings: Patient declined    Marital Status: Patient declined     Family History:  The patient's family history includes Heart failure in his father; Psoriasis in his mother.  ROS:   12-point review of systems is negative unless otherwise noted in the HPI.   EKGs/Labs/Other Studies Reviewed:    Studies reviewed were summarized above. The additional studies were reviewed today:  Stress cMRI of 08/06/2022 ([facility]): 1. Abnormal stress perfusion study. Diffuse perfusion defect of all basal-mid LV segments extending beyond  areas of scarring suggestive of mixed ischemia and infarction; given diffuse distribution involving  all  basal-mid LV segments, this study is suggestive of multivessel disease.  2. Normal LV size and systolic function; LVEF 74%. Moderate concentric hypertrophy with increased LV mass  125g-m2.  3. Normal RV size and systolic function; RVEF 74%.  4. Mild mitral and tricuspid regurgitation.  5. Diffuse patchy subendocardial fibrosis of all basal-mid LV segments - this may represent non-ischemic  scarring related to hypertensive heart disease as well as areas of infarction; LV scar size ~22%. ECV in  areas without scar ~33% consistent with mild diffuse interstitial fibrosis.  __________  Eugenie Birks MPI 04/29/2022 ([facility]):   ECG test result was negative for evidence of ischemia. There was no  chest pain during the exam.    Myocardial perfusion imaging test is normal.    Overall left ventricular systolic function was mildly reduced. The  calculated ejection fraction was measured at 45%.    Low cardiovascular risk    No prior study.  __________  2D echo 04/29/2022 ([facility]): 1. Left ventricle: The left ventricular cavity size is normal. Wall     thickness is normal. Systolic function is normal. The ejection fraction     is 65% (+/-5%), estimated visually. Overall assessment of diastolic     function is normal with normal estimate of left atrial pressure.  2. Right ventricle: The right ventricular cavity size is at the upper limits     of normal. Systolic function is normal as estimated by visual and     quantitative measures.  3. Pericardium, extracardiac: A trivial pericardial effusion is identified.     There is no evidence of hemodynamic compromise.  4. No significant valve stenosis or regurgitation.  __________  LHC 04/24/2020: Ost RCA to Prox RCA lesion is 30% stenosed. Ost LAD to Prox LAD lesion is 40% stenosed. Prox LAD to Mid LAD lesion is 99% stenosed. Post intervention, there is a 0% residual stenosis. A drug-eluting stent was successfully placed using a STENT RESOLUTE ONYX  4.0X15. Mid LAD lesion is 90% stenosed. Post intervention, there is a 0% residual stenosis. A drug-eluting stent was successfully placed using a STENT RESOLUTE ONYX 3.0X15. Mid LAD to Dist LAD lesion is 60% stenosed. Dist LAD lesion is 30% stenosed.   1.  Left dominant  coronary arteries with severe one-vessel coronary artery disease involving proximal LAD with 99% stenosis and mid LAD with 90% stenosis.  There is moderate LAD disease in the mid to distal segment that was left to be treated medically. 2.  Left ventricular angiography was not performed due to chronic kidney disease.  LVEDP was moderately elevated at 25 mmHg. 3.  Successful IVUS guided PCI and drug-eluting stent placement to the mid and proximal LAD.   Recommendations: Dual antiplatelet therapy for at least 1 year. Aggressive treatment of risk factors. Given the patient's advanced chronic kidney disease, I am going to observe him overnight with hydration.  Recheck renal function in the morning.  A total of 80 mL of contrast was used for the procedure. __________  Eugenie Birks MPI 03/02/2020: IMPRESSION: 1. Potential small area of pharmacologically induced ischemia involving the apex of the left ventricle. Clinical correlation is advised. 2. No scintigraphic evidence of prior infarction. 3. Mild global hypokinesia, slightly more conspicuously involving the basilar aspect of the inferior wall of the left ventricle. 4. Ejection fraction - 47%. __________  2D echo 03/01/2020: 1. Left ventricular ejection fraction, by estimation, is 50 to 55%. The  left ventricle has low normal function. The left ventricle demonstrates  regional wall motion abnormalities (see scoring diagram/findings for  description). Left ventricular diastolic   parameters are consistent with Grade I diastolic dysfunction (impaired  relaxation). There is moderate hypokinesis of the left ventricular, basal  inferior wall.   2. Right ventricular systolic  function is normal. The right ventricular  size is normal.   3. The mitral valve is normal in structure. No evidence of mitral valve  regurgitation.   4. The aortic valve is normal in structure. Aortic valve regurgitation is  not visualized. Mild aortic valve sclerosis is present, with no evidence  of aortic valve stenosis.   5. The inferior vena cava is dilated in size with >50% respiratory  variability, suggesting right atrial pressure of 8 mmHg.     EKG:  EKG is ordered today.  The EKG ordered today demonstrates NSR, 67 bpm, first-degree AV block, poor R wave progression along the precordial leads, nonspecific lateral ST-T changes  Recent Labs: 06/15/2022: ALT 26 12/23/2022: BUN 61; Creatinine, Ser 8.07; Hemoglobin 9.4; Platelets 166; Potassium 3.8; Sodium 136  Recent Lipid Panel    Component Value Date/Time   CHOL 81 12/12/2020 0000   TRIG 89 12/12/2020 0000   HDL 25 (A) 12/12/2020 0000   CHOLHDL 4.9 03/02/2020 0111   VLDL 21 03/02/2020 0111   LDLCALC 39 12/12/2020 0000    PHYSICAL EXAM:    VS:  BP (!) 145/76 (BP Location: Left Arm, Patient Position: Sitting, Cuff Size: Normal)   Pulse 67   Ht 5\' 9"  (1.753 m)   Wt 229 lb (103.9 kg)   SpO2 96%   BMI 33.82 kg/m   BMI: Body mass index is 33.82 kg/m.  Physical Exam Vitals reviewed.  Constitutional:      Appearance: He is well-developed.  HENT:     Head: Normocephalic and atraumatic.  Eyes:     General:        Right eye: No discharge.        Left eye: No discharge.  Cardiovascular:     Rate and Rhythm: Normal rate and regular rhythm.     Heart sounds: Normal heart sounds, S1 normal and S2 normal. Heart sounds not distant. No midsystolic click and no opening snap. No murmur heard.    No  friction rub.  Pulmonary:     Effort: Pulmonary effort is normal. No respiratory distress.     Breath sounds: Normal breath sounds. No decreased breath sounds, wheezing or rales.  Chest:     Chest wall: No tenderness.  Abdominal:      General: There is no distension.  Musculoskeletal:     Cervical back: Normal range of motion.     Right lower leg: No edema.     Left lower leg: No edema.  Skin:    General: Skin is warm and dry.     Nails: There is no clubbing.  Neurological:     Mental Status: He is alert and oriented to person, place, and time.  Psychiatric:        Speech: Speech normal.        Behavior: Behavior normal.        Thought Content: Thought content normal.        Judgment: Judgment normal.     Wt Readings from Last 3 Encounters:  04/12/23 229 lb (103.9 kg)  03/03/23 227 lb (103 kg)  02/18/23 227 lb 1.2 oz (103 kg)     ASSESSMENT & PLAN:   CAD involving the native coronary arteries with exertional dyspnea concerning for anginal equivalent and abnormal stress cMRI: Currently without symptoms of angina or cardiac decompensation.  Stress MRI through outside institution in 08/2022 abnormal with findings suggestive of multivessel disease.  In this setting he has been taken off the renal transplant list.  Schedule LHC for further evaluation.  Remains on aspirin, clopidogrel, amlodipine, atorvastatin, and carvedilol.  Medication list also indicates he is taking metoprolol.  Further clarification of this will be needed in follow-up.  Aggressive risk factor modification.  HTN: Blood pressure is mildly elevated in the office today.  Remains on amlodipine and carvedilol (? metoprolol).  Follow-up after cardiac cath.  HLD: LDL 39 in 12/2020.  Remains on atorvastatin 80 mg.  Check CMP and lipid panel.  PAD: Status post intervention.  Followed by vascular surgery, follow-up as directed.  ESRD on HD with anemia of chronic disease: Per nephrology.     Informed Consent   Shared Decision Making/Informed Consent{  The risks [stroke (1 in 1000), death (1 in 1000), kidney failure [usually temporary] (1 in 500), bleeding (1 in 200), allergic reaction [possibly serious] (1 in 200)], benefits (diagnostic support and  management of coronary artery disease) and alternatives of a cardiac catheterization were discussed in detail with Mr. Rametta and he is willing to proceed.        Disposition: F/u with Dr. Mariah Milling or an APP 1-2 weeks after LHC   Medication Adjustments/Labs and Tests Ordered: Current medicines are reviewed at length with the patient today.  Concerns regarding medicines are outlined above. Medication changes, Labs and Tests ordered today are summarized above and listed in the Patient Instructions accessible in Encounters.   Signed, Eula Listen, PA-C 04/12/2023 4:36 PM     [facility] [place] [address] [place], [place] [address] [phone]

## 2023-04-13 ENCOUNTER — Other Ambulatory Visit: Payer: Self-pay | Admitting: Cardiovascular Disease

## 2023-04-13 DIAGNOSIS — E785 Hyperlipidemia, unspecified: Secondary | ICD-10-CM

## 2023-04-13 DIAGNOSIS — I25118 Atherosclerotic heart disease of native coronary artery with other forms of angina pectoris: Secondary | ICD-10-CM

## 2023-04-13 LAB — COMPREHENSIVE METABOLIC PANEL
ALT: 18 IU/L (ref 0–44)
AST: 11 IU/L (ref 0–40)
Albumin: 4.5 g/dL (ref 3.8–4.8)
Alkaline Phosphatase: 109 IU/L (ref 44–121)
BUN/Creatinine Ratio: 8 — ABNORMAL LOW (ref 10–24)
BUN: 66 mg/dL — ABNORMAL HIGH (ref 8–27)
Bilirubin Total: 0.5 mg/dL (ref 0.0–1.2)
CO2: 23 mmol/L (ref 20–29)
Calcium: 9.7 mg/dL (ref 8.6–10.2)
Chloride: 98 mmol/L (ref 96–106)
Creatinine, Ser: 8.69 mg/dL — ABNORMAL HIGH (ref 0.76–1.27)
Globulin, Total: 2.2 g/dL (ref 1.5–4.5)
Glucose: 172 mg/dL — ABNORMAL HIGH (ref 70–99)
Potassium: 4.7 mmol/L (ref 3.5–5.2)
Sodium: 139 mmol/L (ref 134–144)
Total Protein: 6.7 g/dL (ref 6.0–8.5)
eGFR: 6 mL/min/{1.73_m2} — ABNORMAL LOW (ref >59)

## 2023-04-13 LAB — LIPID PANEL
Chol/HDL Ratio: 3.6 ratio (ref 0.0–5.0)
Cholesterol, Total: 87 mg/dL — ABNORMAL LOW (ref 100–199)
HDL: 24 mg/dL — ABNORMAL LOW (ref >39)
LDL Chol Calc (NIH): 43 mg/dL (ref 0–99)
Triglycerides: 108 mg/dL (ref 0–149)
VLDL Cholesterol Cal: 20 mg/dL (ref 5–40)

## 2023-04-13 LAB — CBC
Hematocrit: 33.3 % — ABNORMAL LOW (ref 37.5–51.0)
Hemoglobin: 10.7 g/dL — ABNORMAL LOW (ref 13.0–17.7)
MCH: 32.1 pg (ref 26.6–33.0)
MCHC: 32.1 g/dL (ref 31.5–35.7)
MCV: 100 fL — ABNORMAL HIGH (ref 79–97)
Platelets: 147 10*3/uL — ABNORMAL LOW (ref 150–450)
RBC: 3.33 x10E6/uL — ABNORMAL LOW (ref 4.14–5.80)
RDW: 13.2 % (ref 11.6–15.4)
WBC: 6.2 10*3/uL (ref 3.4–10.8)

## 2023-04-13 NOTE — Telephone Encounter (Signed)
Call for pt and wife reported pt is sleeping, per DPR, spoke to wife (she was the one that called initially) regarding question for dialysis. Pt will receive dialysis (at home hemodialysis) on Wednesday and again on Saturday after the procedure on Friday  Wife encouraged to call back if they have any concerns.

## 2023-04-15 ENCOUNTER — Telehealth: Payer: Self-pay | Admitting: *Deleted

## 2023-04-15 DIAGNOSIS — N2581 Secondary hyperparathyroidism of renal origin: Secondary | ICD-10-CM | POA: Diagnosis not present

## 2023-04-15 DIAGNOSIS — Z992 Dependence on renal dialysis: Secondary | ICD-10-CM | POA: Diagnosis not present

## 2023-04-15 DIAGNOSIS — N186 End stage renal disease: Secondary | ICD-10-CM | POA: Diagnosis not present

## 2023-04-15 NOTE — Telephone Encounter (Signed)
Reviewed time, date, location, and instructions for his procedure tomorrow. He verbalized understanding with no further questions at this time.

## 2023-04-16 ENCOUNTER — Other Ambulatory Visit: Payer: Self-pay

## 2023-04-16 ENCOUNTER — Encounter
Admission: RE | Disposition: A | Discharge status: Home / Self Care | Payer: Self-pay | Source: Home / Self Care | Attending: Cardiovascular Disease

## 2023-04-16 ENCOUNTER — Ambulatory Visit
Admission: RE | Admit: 2023-04-16 | Discharge: 2023-04-16 | Disposition: A | Discharge status: Home / Self Care | Payer: Medicare HMO | Attending: Cardiovascular Disease | Admitting: Cardiovascular Disease

## 2023-04-16 ENCOUNTER — Encounter: Payer: Self-pay | Admitting: Cardiovascular Disease

## 2023-04-16 DIAGNOSIS — I25118 Atherosclerotic heart disease of native coronary artery with other forms of angina pectoris: Secondary | ICD-10-CM | POA: Insufficient documentation

## 2023-04-16 DIAGNOSIS — Z79899 Other long term (current) drug therapy: Secondary | ICD-10-CM | POA: Diagnosis not present

## 2023-04-16 DIAGNOSIS — Z7902 Long term (current) use of antithrombotics/antiplatelets: Secondary | ICD-10-CM | POA: Insufficient documentation

## 2023-04-16 DIAGNOSIS — Z6833 Body mass index (BMI) 33.0-33.9, adult: Secondary | ICD-10-CM | POA: Diagnosis not present

## 2023-04-16 DIAGNOSIS — E1151 Type 2 diabetes mellitus with diabetic peripheral angiopathy without gangrene: Secondary | ICD-10-CM | POA: Insufficient documentation

## 2023-04-16 DIAGNOSIS — I252 Old myocardial infarction: Secondary | ICD-10-CM | POA: Diagnosis not present

## 2023-04-16 DIAGNOSIS — I120 Hypertensive chronic kidney disease with stage 5 chronic kidney disease or end stage renal disease: Secondary | ICD-10-CM | POA: Insufficient documentation

## 2023-04-16 DIAGNOSIS — Z87891 Personal history of nicotine dependence: Secondary | ICD-10-CM | POA: Diagnosis not present

## 2023-04-16 DIAGNOSIS — N186 End stage renal disease: Secondary | ICD-10-CM | POA: Insufficient documentation

## 2023-04-16 DIAGNOSIS — Z955 Presence of coronary angioplasty implant and graft: Secondary | ICD-10-CM | POA: Diagnosis not present

## 2023-04-16 DIAGNOSIS — Z7982 Long term (current) use of aspirin: Secondary | ICD-10-CM | POA: Diagnosis not present

## 2023-04-16 DIAGNOSIS — I2584 Coronary atherosclerosis due to calcified coronary lesion: Secondary | ICD-10-CM | POA: Diagnosis not present

## 2023-04-16 DIAGNOSIS — E785 Hyperlipidemia, unspecified: Secondary | ICD-10-CM | POA: Insufficient documentation

## 2023-04-16 DIAGNOSIS — E1122 Type 2 diabetes mellitus with diabetic chronic kidney disease: Secondary | ICD-10-CM | POA: Insufficient documentation

## 2023-04-16 DIAGNOSIS — Z992 Dependence on renal dialysis: Secondary | ICD-10-CM | POA: Diagnosis not present

## 2023-04-16 DIAGNOSIS — R079 Chest pain, unspecified: Secondary | ICD-10-CM

## 2023-04-16 DIAGNOSIS — D631 Anemia in chronic kidney disease: Secondary | ICD-10-CM | POA: Insufficient documentation

## 2023-04-16 DIAGNOSIS — R9439 Abnormal result of other cardiovascular function study: Secondary | ICD-10-CM | POA: Diagnosis present

## 2023-04-16 HISTORY — PX: LEFT HEART CATH AND CORONARY ANGIOGRAPHY: CATH118249

## 2023-04-16 LAB — GLUCOSE, CAPILLARY
Glucose-Capillary: 157 mg/dL — ABNORMAL HIGH (ref 70–99)
Glucose-Capillary: 131 mg/dL — ABNORMAL HIGH (ref 70–99)

## 2023-04-16 SURGERY — LEFT HEART CATH AND CORONARY ANGIOGRAPHY
Anesthesia: Moderate Sedation | Laterality: Left

## 2023-04-16 MED ORDER — FENTANYL CITRATE (PF) 100 MCG/2ML IJ SOLN
INTRAMUSCULAR | Status: AC
  Filled 2023-04-16: qty 2

## 2023-04-16 MED ORDER — MIDAZOLAM HCL 2 MG/2ML IJ SOLN
INTRAMUSCULAR | Status: DC | PRN
  Administered 2023-04-16: 1 mg via INTRAVENOUS
  Administered 2023-04-16: .5 mg via INTRAVENOUS

## 2023-04-16 MED ORDER — VERAPAMIL HCL 2.5 MG/ML IV SOLN
INTRAVENOUS | Status: AC
  Filled 2023-04-16: qty 2

## 2023-04-16 MED ORDER — HEPARIN (PORCINE) IN NACL 2000-0.9 UNIT/L-% IV SOLN
INTRAVENOUS | Status: DC | PRN
  Administered 2023-04-16: 1000 mL

## 2023-04-16 MED ORDER — LIDOCAINE HCL (PF) 1 % IJ SOLN
INTRAMUSCULAR | Status: DC | PRN
  Administered 2023-04-16: 5 mL

## 2023-04-16 MED ORDER — LIDOCAINE HCL 1 % IJ SOLN
INTRAMUSCULAR | Status: AC
  Filled 2023-04-16: qty 20

## 2023-04-16 MED ORDER — IOHEXOL 300 MG/ML  SOLN
INTRAMUSCULAR | Status: DC | PRN
  Administered 2023-04-16: 98 mL

## 2023-04-16 MED ORDER — ACETAMINOPHEN 325 MG PO TABS: 650.0000 mg | ORAL_TABLET | ORAL | Status: DC | PRN

## 2023-04-16 MED ORDER — SODIUM CHLORIDE 0.9% FLUSH: 3.0000 mL | Freq: Two times a day (BID) | INTRAVENOUS | Status: DC

## 2023-04-16 MED ORDER — SODIUM CHLORIDE 0.9 % IV SOLN: 250.0000 mL | INTRAVENOUS | Status: DC | PRN

## 2023-04-16 MED ORDER — HEPARIN SODIUM (PORCINE) 1000 UNIT/ML IJ SOLN
INTRAMUSCULAR | Status: AC
  Filled 2023-04-16: qty 10

## 2023-04-16 MED ORDER — SODIUM CHLORIDE 0.9% FLUSH: 3.0000 mL | INTRAVENOUS | Status: DC | PRN

## 2023-04-16 MED ORDER — HEPARIN SODIUM (PORCINE) 1000 UNIT/ML IJ SOLN
INTRAMUSCULAR | Status: DC | PRN
  Administered 2023-04-16: 5000 [IU] via INTRAVENOUS

## 2023-04-16 MED ORDER — HEPARIN (PORCINE) IN NACL 1000-0.9 UT/500ML-% IV SOLN
INTRAVENOUS | Status: AC
  Filled 2023-04-16: qty 1000

## 2023-04-16 MED ORDER — VERAPAMIL HCL 2.5 MG/ML IV SOLN
INTRAVENOUS | Status: DC | PRN
  Administered 2023-04-16: 2.5 mg via INTRAVENOUS

## 2023-04-16 MED ORDER — FENTANYL CITRATE (PF) 100 MCG/2ML IJ SOLN
INTRAMUSCULAR | Status: DC | PRN
  Administered 2023-04-16: 50 ug via INTRAVENOUS
  Administered 2023-04-16: 25 ug via INTRAVENOUS

## 2023-04-16 MED ORDER — ONDANSETRON HCL 4 MG/2ML IJ SOLN: 4.0000 mg | Freq: Four times a day (QID) | INTRAMUSCULAR | Status: DC | PRN

## 2023-04-16 MED ORDER — ASPIRIN 81 MG PO CHEW: 81.0000 mg | CHEWABLE_TABLET | ORAL | Status: DC

## 2023-04-16 MED ORDER — MIDAZOLAM HCL 2 MG/2ML IJ SOLN
INTRAMUSCULAR | Status: AC
  Filled 2023-04-16: qty 2

## 2023-04-16 MED ORDER — SODIUM CHLORIDE 0.9 % IV SOLN: INTRAVENOUS | Status: DC

## 2023-04-16 SURGICAL SUPPLY — 12 items
CATH INFINITI 5 FR 3DRC (CATHETERS) IMPLANT
CATH INFINITI 5 FR JL3.5 (CATHETERS) IMPLANT
CATH INFINITI 5FR MULTPACK ANG (CATHETERS) IMPLANT
DEVICE RAD TR BAND REGULAR (VASCULAR PRODUCTS) IMPLANT
DRAPE BRACHIAL (DRAPES) IMPLANT
GLIDESHEATH SLEND SS 6F .021 (SHEATH) IMPLANT
GUIDEWIRE INQWIRE 1.5J.035X260 (WIRE) IMPLANT
INQWIRE 1.5J .035X260CM (WIRE) ×1
PACK CARDIAC CATH (CUSTOM PROCEDURE TRAY) ×1 IMPLANT
PROTECTION STATION PRESSURIZED (MISCELLANEOUS) ×1
SET ATX-X65L (MISCELLANEOUS) IMPLANT
STATION PROTECTION PRESSURIZED (MISCELLANEOUS) IMPLANT

## 2023-04-16 NOTE — Interval H&P Note (Signed)
History and Physical Interval Note:  04/16/2023 11:05 AM  Jeremy Dorsey  has presented today for surgery, with the diagnosis of L Cath    CAD  Abnormal stress MRI  Exertional fatigue.  The various methods of treatment have been discussed with the patient and family. After consideration of risks, benefits and other options for treatment, the patient has consented to  Procedure(s): LEFT HEART CATH AND CORONARY ANGIOGRAPHY (Left) as a surgical intervention.  The patient's history has been reviewed, patient examined, no change in status, stable for surgery.  I have reviewed the patient's chart and labs.  Questions were answered to the patient's satisfaction.     Lorine Bears

## 2023-04-17 DIAGNOSIS — N2581 Secondary hyperparathyroidism of renal origin: Secondary | ICD-10-CM | POA: Diagnosis not present

## 2023-04-17 DIAGNOSIS — Z992 Dependence on renal dialysis: Secondary | ICD-10-CM | POA: Diagnosis not present

## 2023-04-17 DIAGNOSIS — N186 End stage renal disease: Secondary | ICD-10-CM | POA: Diagnosis not present

## 2023-04-20 ENCOUNTER — Encounter: Payer: Self-pay | Admitting: Vascular Surgery

## 2023-04-20 ENCOUNTER — Ambulatory Visit: Payer: Medicare HMO | Admitting: Vascular Surgery

## 2023-04-20 VITALS — BP 152/76 | HR 74 | Temp 97.6°F | Resp 18 | Ht 70.0 in | Wt 232.0 lb

## 2023-04-20 DIAGNOSIS — G5603 Carpal tunnel syndrome, bilateral upper limbs: Secondary | ICD-10-CM

## 2023-04-20 DIAGNOSIS — G5600 Carpal tunnel syndrome, unspecified upper limb: Secondary | ICD-10-CM | POA: Insufficient documentation

## 2023-04-20 NOTE — Progress Notes (Signed)
Patient name: Jeremy Dorsey MRN: [id number] DOB: 12-17-1950 Sex: male  REASON FOR VISIT: Bilateral upper extremity swelling/pain - triage visit  HPI: Jeremy Dorsey is a 72 y.o. male with history of coronary artery disease, diabetes, hypertension, hyperlipidemia, end-stage renal disease that presents for evaluation of bilateral hand pain for 3 months.  He states this bothers his right middle and ring finger and his left middle finger.  States this is mainly numbness.  He does have some improvement when he wears braces at night.  There is no pain above the wrist.  Patient is well-known to vascular surgery and had a left radiocephalic AV fistula on 10/21/2020 and then a left brachiocephalic fistula on 07/09/2021 for his dialysis access.  States his left arm fistula is working well.  He also had lower extremity interventions including a right AT DP angioplasty on 01/29/2022 and a right TP trunk peroneal angioplasty on 06/11/2022.  No right foot rest pain at this time.  Past Medical History:  Diagnosis Date   Anemia    Cancer (HCC)    basal cell nose and forehead   Cataract 2016   Cataract surgery bith eyes   CKD (chronic kidney disease), stage IV (HCC)    Coronary artery disease    a. s/p IVUS-guided DESx2 to prox & mid LAD, residual disease treated medically. EF 50-55% by recent echo 02/2020.   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   Urinary complication     Past Surgical History:  Procedure Laterality Date   ABDOMINAL AORTOGRAM W/LOWER EXTREMITY N/A 01/29/2022   Procedure: ABDOMINAL AORTOGRAM W/ Bilateral LOWER EXTREMITY [place];  Surgeon: Cephus Shelling, MD;  Location: [facility];  Service: Cardiovascular;  Laterality: N/A;   ABDOMINAL AORTOGRAM W/LOWER EXTREMITY N/A 06/11/2022   Procedure: ABDOMINAL  AORTOGRAM W/LOWER EXTREMITY;  Surgeon: Cephus Shelling, MD;  Location: [facility];  Service: Cardiovascular;  Laterality: N/A;   AV FISTULA PLACEMENT Left 10/31/2020   Procedure: LEFT UPPER EXTREMITY ARTERIOVENOUS (AV) FISTULA CREATION;  Surgeon: Maeola Harman, MD;  Location: [facility];  Service: Vascular;  Laterality: Left;   AV FISTULA PLACEMENT Left 07/09/2021   Procedure: LEFT ARM ARTERIOVENOUS (AV) FISTULA CREATION;  Surgeon: Nada Libman, MD;  Location: [facility];  Service: Vascular;  Laterality: Left;   COLONOSCOPY  1990   COLONOSCOPY  06/09/2011   Dr Lemar Livings   CORONARY STENT INTERVENTION N/A 04/24/2020   Procedure: CORONARY STENT INTERVENTION;  Surgeon: Iran Ouch, MD;  Location: [facility];  Service: Cardiovascular;  Laterality: N/A;   CORONARY ULTRASOUND/IVUS N/A 04/24/2020   Procedure: Intravascular Ultrasound/IVUS;  Surgeon: Iran Ouch, MD;  Location: [facility];  Service: Cardiovascular;  Laterality: N/A;   CYSTOSCOPY W/ RETROGRADES Bilateral 08/01/2018   Procedure: CYSTOSCOPY WITH RETROGRADE PYELOGRAM;  Surgeon: Sondra Come, MD;  Location: [facility];  Service: Urology;  Laterality: Bilateral;   CYSTOSCOPY WITH BIOPSY N/A 08/01/2018   Procedure: CYSTOSCOPY WITH Bladder BIOPSY;  Surgeon: Sondra Come, MD;  Location: [facility];  Service: Urology;  Laterality: N/A;   CYSTOSCOPY WITH STENT PLACEMENT Right 08/01/2018   Procedure: CYSTOSCOPY WITH STENT PLACEMENT;  Surgeon: Sondra Come, MD;  Location: [facility];  Service: Urology;  Laterality: Right;  EYE SURGERY Right    laser surgery   FRACTURE SURGERY     Rt shoulder rotaor cuff   LEFT HEART CATH AND CORONARY ANGIOGRAPHY N/A 04/24/2020   Procedure: LEFT HEART CATH AND CORONARY ANGIOGRAPHY;  Surgeon: Iran Ouch, MD;  Location: [facility];  Service: Cardiovascular;  Laterality: N/A;   LEFT HEART CATH AND CORONARY ANGIOGRAPHY Left 04/16/2023   Procedure: LEFT  HEART CATH AND CORONARY ANGIOGRAPHY;  Surgeon: Iran Ouch, MD;  Location: [facility];  Service: Cardiovascular;  Laterality: Left;   PERIPHERAL VASCULAR BALLOON ANGIOPLASTY Right 01/29/2022   Procedure: PERIPHERAL VASCULAR BALLOON ANGIOPLASTY;  Surgeon: Cephus Shelling, MD;  Location: [facility];  Service: Cardiovascular;  Laterality: Right;   PERIPHERAL VASCULAR BALLOON ANGIOPLASTY Right 06/11/2022   Procedure: PERIPHERAL VASCULAR BALLOON ANGIOPLASTY;  Surgeon: Cephus Shelling, MD;  Location: [facility];  Service: Cardiovascular;  Laterality: Right;  Peroneal   SHOULDER ARTHROSCOPY WITH OPEN ROTATOR CUFF REPAIR Right 08/25/2018   Procedure: SHOULDER ARTHROSCOPY WITH MINI OPEN ROTATOR CUFF REPAIR;  Surgeon: Juanell Fairly, MD;  Location: [facility];  Service: Orthopedics;  Laterality: Right;   TRANSURETHRAL RESECTION OF BLADDER TUMOR N/A 08/01/2018   Procedure: TRANSURETHRAL RESECTION OF BLADDER TUMOR (TURBT);  Surgeon: Sondra Come, MD;  Location: [facility];  Service: Urology;  Laterality: N/A;   URETEROSCOPY WITH HOLMIUM LASER LITHOTRIPSY Right 08/01/2018   Procedure: URETEROSCOPY WITH HOLMIUM LASER LITHOTRIPSY;  Surgeon: Sondra Come, MD;  Location: [facility];  Service: Urology;  Laterality: Right;    Family History  Problem Relation Age of Onset   Psoriasis Mother    Heart failure Father     SOCIAL HISTORY: Social History   Tobacco Use   Smoking status: Former    Current packs/day: 0.00    Average packs/day: 1 pack/day for 30.0 years (30.0 ttl pk-yrs)    Types: Cigarettes    Start date: 08/22/1977    Quit date: 08/23/2007    Years since quitting: 15.6    Passive exposure: Past   Smokeless tobacco: Never  Substance Use Topics   Alcohol use: No    Allergies  Allergen Reactions   Oxycodone Nausea And Vomiting   Hydrocodone Nausea And Vomiting    Current Outpatient Medications  Medication Sig Dispense Refill   amLODipine  (NORVASC) 5 MG tablet Take 5 mg by mouth every evening.     aspirin EC (ASPIRIN LOW DOSE) 81 MG tablet TAKE 1 TABLET BY MOUTH EVERY MORNING (SWALLOW WHOLE) 30 tablet 1   atorvastatin (LIPITOR) 80 MG tablet TAKE 1 TABLET BY MOUTH DAILY 30 tablet 1   AURYXIA 1 GM 210 MG(Fe) tablet Take by mouth.     calcitRIOL (ROCALTROL) 0.25 MCG capsule Take 0.25 mcg by mouth in the morning.     carvedilol (COREG) 12.5 MG tablet Take 1 tablet by mouth 2 (two) times daily.     cinacalcet (SENSIPAR) 30 MG tablet PLEASE SEE ATTACHED FOR DETAILED DIRECTIONS     clopidogrel (PLAVIX) 75 MG tablet TAKE 1 TABLET BY MOUTH DAILY 30 tablet 1   lidocaine-prilocaine (EMLA) cream Apply 1 Application topically daily as needed (prior to port being accessed.).     multivitamin (RENA-VIT) TABS tablet Take 1 tablet by mouth daily.     nitroGLYCERIN (NITROSTAT) 0.4 MG SL tablet Place 1 tablet (0.4 mg total) under the tongue every 5 (five) minutes x 3 doses as needed for chest pain. 25 tablet 0   sevelamer  carbonate (RENVELA) 800 MG tablet Take 800 mg by mouth with breakfast, with lunch, and with evening meal.     sildenafil (VIAGRA) 100 MG tablet Take 100 mg by mouth as needed for erectile dysfunction.     torsemide (DEMADEX) 100 MG tablet Take 100 mg by mouth every morning.     No current facility-administered medications for this visit.    REVIEW OF SYSTEMS:  [X]  denotes positive finding, [ ]  denotes negative finding Cardiac  Comments:  Chest pain or chest pressure:    Shortness of breath upon exertion:    Short of breath when lying flat:    Irregular heart rhythm:        Vascular    Pain in calf, thigh, or hip brought on by ambulation:    Pain in feet at night that wakes you up from your sleep:     Blood clot in your veins:    Leg swelling:         Pulmonary    Oxygen at home:    Productive cough:     Wheezing:         Neurologic    Sudden weakness in arms or legs:     Sudden numbness in arms or legs:  x  Numbness both hands  Sudden onset of difficulty speaking or slurred speech:    Temporary loss of vision in one eye:     Problems with dizziness:         Gastrointestinal    Blood in stool:     Vomited blood:         Genitourinary    Burning when urinating:     Blood in urine:        Psychiatric    Major depression:         Hematologic    Bleeding problems:    Problems with blood clotting too easily:        Skin    Rashes or ulcers:        Constitutional    Fever or chills:      PHYSICAL EXAM: There were no vitals filed for this visit.  GENERAL: The patient is a well-nourished male, in no acute distress. The vital signs are documented above. CARDIAC: There is a regular rate and rhythm.  VASCULAR:  Bilateral radial pulses palpable at the wrist No upper extremity tissue loss Right DP PT brisk by Doppler PULMONARY: No respiratory distress. ABDOMEN: Soft and non-tender.  MUSCULOSKELETAL: There are no major deformities or cyanosis. NEUROLOGIC: No focal weakness or paresthesias are detected. SKIN: There are no ulcers or rashes noted. PSYCHIATRIC: The patient has a normal affect.  DATA:   N/A  Assessment/Plan:   72 y.o. male with history of coronary artery disease, diabetes, hypertension, hyperlipidemia, end-stage renal disease that presents for evaluation of bilateral hand pain for 3 months.  He has palpable radial pulses at the wrist bilaterally.  Both of his hands are warm.  He has no upper extremity tissue loss.  Even in the setting of a functioning left arm fistula he has a palpable pulse at the wrist.  Discussed I do not think this is related to arterial insufficiency.    His presentation suggests carpal tunnel syndrome as he has mainly numbness in the right middle finger and ring finger as well as the left middle finger.  This gets better at night when he wears braces.  I will refer him to orthopedic hand surgery for further evaluation and input.  I will see him in 6  months with right leg arterial duplex and ABIs for surveillance in our office. Previous right leg intervention for CLI with rest pain and no symptoms in right leg at this time.   Cephus Shelling, MD [facility] Office: [phone]

## 2023-04-21 DIAGNOSIS — N186 End stage renal disease: Secondary | ICD-10-CM | POA: Diagnosis not present

## 2023-04-21 DIAGNOSIS — Z992 Dependence on renal dialysis: Secondary | ICD-10-CM | POA: Diagnosis not present

## 2023-04-21 DIAGNOSIS — N2581 Secondary hyperparathyroidism of renal origin: Secondary | ICD-10-CM | POA: Diagnosis not present

## 2023-04-23 ENCOUNTER — Other Ambulatory Visit: Payer: Self-pay

## 2023-04-23 DIAGNOSIS — Z992 Dependence on renal dialysis: Secondary | ICD-10-CM | POA: Diagnosis not present

## 2023-04-23 DIAGNOSIS — I739 Peripheral vascular disease, unspecified: Secondary | ICD-10-CM

## 2023-04-23 DIAGNOSIS — N186 End stage renal disease: Secondary | ICD-10-CM | POA: Diagnosis not present

## 2023-04-23 DIAGNOSIS — N2581 Secondary hyperparathyroidism of renal origin: Secondary | ICD-10-CM | POA: Diagnosis not present

## 2023-04-26 DIAGNOSIS — Z992 Dependence on renal dialysis: Secondary | ICD-10-CM | POA: Diagnosis not present

## 2023-04-26 DIAGNOSIS — N2581 Secondary hyperparathyroidism of renal origin: Secondary | ICD-10-CM | POA: Diagnosis not present

## 2023-04-26 DIAGNOSIS — N186 End stage renal disease: Secondary | ICD-10-CM | POA: Diagnosis not present

## 2023-04-26 NOTE — Progress Notes (Signed)
Cardiology Office Note    Date:  04/30/2023   ID:  Jeremy Dorsey, DOB 11-10-1950, MRN [id number]  PCP:  Ronnald Ramp, MD  Cardiologist:  Julien Nordmann, MD  Electrophysiologist:  None   Chief Complaint: Cardiac cath follow-up  History of Present Illness:   Jeremy Dorsey is a 72 y.o. male with history of CAD with NSTEMI in 02/2020 with subsequent PCI to the LAD in 04/2020, ESRD on HD, DM2, PAD with right anterior tibial/dorsalis pedis and peroneal artery angioplasty in 01/2022, and right TP trunk/peroneal angioplasty in 06/2022 followed by vascular surgery, HTN, HLD, anemia of chronic disease, morbid obesity, and prior tobacco use who presents for follow-up of LHC.   He was admitted in 02/2020 with an NSTEMI.  LHC was deferred at that time for concern for contrast-induced nephropathy.  Echo showed an EF of 50 to 55%, moderate hypokinesis of the basal inferior wall, grade 1 diastolic dysfunction, normal RV systolic function and ventricular cavity size, aortic valve sclerosis without evidence of stenosis, and an estimated right atrial pressure of 8 mmHg.  Lexiscan MPI at that time showed a potential small area of pharmacologically induced ischemia involving the apex of the LV and EF of 47%.  He subsequently underwent LHC in 04/2020 which showed severe one-vessel CAD involving the proximal LAD with 99% stenosis and 90% mid LAD stenosis.  He underwent successful IVUS guided PCI/DES to the proximal and mid LAD.  There was moderate LAD disease in the mid to distal segment left to be managed medically.  As part of prekidney transplant he has undergone cardiac imaging through [facility] with nuclear stress test in 04/2022 showing no evidence of ischemia with an LVEF of 45%.  Echo at that time showed an EF of 65% with normal LV diastolic function, normal RV systolic function with upper normal ventricular cavity size, trivial pericardial effusion without evidence of hemodynamic compromise, and  no significant valvular abnormality.  Stress cMRI in 08/2022 was abnormal with diffuse perfusion defect of all basal-mid LV segments extending beyond areas of scarring suggestive of mixed ischemia and infarction concerning for multivessel disease, LVEF 74%, moderate concentric LVH, normal RV systolic function and ventricular cavity size, mild MR and TR, and diffuse patchy subendocardial fibrosis possibly representing nonischemic scarring related to hypertensive heart disease versus areas of infarction.  He was last seen on 04/12/2023 and reported he had been taken off the renal transplant list in the setting of the above abnormal stress MRI.  He was without symptoms of angina.  LHC on 04/16/2023 showed widely patent LAD stent with minimal restenosis.  Moderately to severely calcified coronary arteries with no obstructive disease with ostial to proximal LAD 30% stenosis, mid LAD 10% stenosis, mid to distal LAD 50% stenosis, distal LAD 30% stenosis and ostial to proximal RCA 30% stenosis.  LVEF 55 to 65%.  Moderately elevated LVEDP.  He comes in doing well from a cardiac perspective and is without symptoms of angina or cardiac decompensation.  No dyspnea, palpitations, dizziness, presyncope, or syncope.  No progressive lower extremity swelling, abdominal distention, orthopnea, or PND.  No right radial arteriotomy site complications.  Notes blood pressures improved with hemodialysis.   Labs independently reviewed: 04/2023 - TC 87, TG 108, HDL 24, LDL 43, Hgb 10.7, PLT 147, BUN 66, serum creatinine 8.69, potassium 4.7, albumin 4.5, AST/ALT normal 12/2021 - A1c 6.9   Past Medical History:  Diagnosis Date   Anemia    Cancer (HCC)    basal  cell nose and forehead   Cataract 2016   Cataract surgery bith eyes   CKD (chronic kidney disease), stage IV (HCC)    Coronary artery disease    a. s/p IVUS-guided DESx2 to prox & mid LAD, residual disease treated medically. EF 50-55% by recent echo 02/2020.   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   Urinary complication     Past Surgical History:  Procedure Laterality Date   ABDOMINAL AORTOGRAM W/LOWER EXTREMITY N/A 01/29/2022   Procedure: ABDOMINAL AORTOGRAM W/ Bilateral LOWER EXTREMITY [place];  Surgeon: Cephus Shelling, MD;  Location: [facility];  Service: Cardiovascular;  Laterality: N/A;   ABDOMINAL AORTOGRAM W/LOWER EXTREMITY N/A 06/11/2022   Procedure: ABDOMINAL AORTOGRAM W/LOWER EXTREMITY;  Surgeon: Cephus Shelling, MD;  Location: [facility];  Service: Cardiovascular;  Laterality: N/A;   AV FISTULA PLACEMENT Left 10/31/2020   Procedure: LEFT UPPER EXTREMITY ARTERIOVENOUS (AV) FISTULA CREATION;  Surgeon: Maeola Harman, MD;  Location: [facility];  Service: Vascular;  Laterality: Left;   AV FISTULA PLACEMENT Left 07/09/2021   Procedure: LEFT ARM ARTERIOVENOUS (AV) FISTULA CREATION;  Surgeon: Nada Libman, MD;  Location: [facility];  Service: Vascular;  Laterality: Left;   COLONOSCOPY  1990   COLONOSCOPY  06/09/2011   Dr Lemar Livings   CORONARY STENT INTERVENTION N/A 04/24/2020   Procedure: CORONARY STENT INTERVENTION;  Surgeon: Iran Ouch, MD;  Location: [facility];  Service: Cardiovascular;  Laterality: N/A;   CORONARY ULTRASOUND/IVUS N/A 04/24/2020   Procedure: Intravascular Ultrasound/IVUS;  Surgeon: Iran Ouch, MD;  Location: [facility];  Service: Cardiovascular;  Laterality: N/A;   CYSTOSCOPY W/ RETROGRADES Bilateral 08/01/2018   Procedure: CYSTOSCOPY WITH RETROGRADE PYELOGRAM;  Surgeon: Sondra Come, MD;  Location: [facility];  Service: Urology;  Laterality: Bilateral;   CYSTOSCOPY WITH BIOPSY N/A 08/01/2018   Procedure: CYSTOSCOPY WITH Bladder BIOPSY;  Surgeon: Sondra Come, MD;  Location: [facility];  Service: Urology;   Laterality: N/A;   CYSTOSCOPY WITH STENT PLACEMENT Right 08/01/2018   Procedure: CYSTOSCOPY WITH STENT PLACEMENT;  Surgeon: Sondra Come, MD;  Location: [facility];  Service: Urology;  Laterality: Right;   EYE SURGERY Right    laser surgery   FRACTURE SURGERY     Rt shoulder rotaor cuff   LEFT HEART CATH AND CORONARY ANGIOGRAPHY N/A 04/24/2020   Procedure: LEFT HEART CATH AND CORONARY ANGIOGRAPHY;  Surgeon: Iran Ouch, MD;  [facility]: [facility];  Service: Cardiovascular;  Laterality: N/A;   LEFT HEART CATH AND CORONARY ANGIOGRAPHY Left 04/16/2023   Procedure: LEFT HEART CATH AND CORONARY ANGIOGRAPHY;  Surgeon: Iran Ouch, MD;  Location: [facility];  Service: Cardiovascular;  Laterality: Left;   PERIPHERAL VASCULAR BALLOON ANGIOPLASTY Right 01/29/2022   Procedure: PERIPHERAL VASCULAR BALLOON ANGIOPLASTY;  Surgeon: Cephus Shelling, MD;  Location: [facility];  Service: Cardiovascular;  Laterality: Right;   PERIPHERAL VASCULAR BALLOON ANGIOPLASTY Right 06/11/2022   Procedure: PERIPHERAL VASCULAR BALLOON ANGIOPLASTY;  Surgeon: Cephus Shelling, MD;  Location: [facility];  Service: Cardiovascular;  Laterality: Right;  Peroneal   SHOULDER ARTHROSCOPY WITH OPEN ROTATOR CUFF REPAIR Right 08/25/2018   Procedure: SHOULDER ARTHROSCOPY WITH MINI OPEN ROTATOR CUFF REPAIR;  Surgeon: Juanell Fairly, MD;  Location: [facility];  Service: Orthopedics;  Laterality: Right;   TRANSURETHRAL RESECTION OF BLADDER TUMOR N/A 08/01/2018   Procedure: TRANSURETHRAL RESECTION OF BLADDER TUMOR (TURBT);  Surgeon: Sondra Come, MD;  Location: [facility];  Service: Urology;  Laterality: N/A;   URETEROSCOPY WITH HOLMIUM LASER LITHOTRIPSY Right 08/01/2018   Procedure: URETEROSCOPY WITH HOLMIUM LASER LITHOTRIPSY;  Surgeon: Sondra Come, MD;  Location: [facility];  Service: Urology;  Laterality: Right;    Current Medications: Current Meds  Medication Sig   amLODipine  (NORVASC) 5 MG tablet Take 5 mg by mouth every evening.   aspirin EC (ASPIRIN LOW DOSE) 81 MG tablet TAKE 1 TABLET BY MOUTH EVERY MORNING (SWALLOW WHOLE)   atorvastatin (LIPITOR) 80 MG tablet TAKE 1 TABLET BY MOUTH DAILY   AURYXIA 1 GM 210 MG(Fe) tablet Take by mouth.   calcitRIOL (ROCALTROL) 0.25 MCG capsule Take 0.25 mcg by mouth in the morning.   carvedilol (COREG) 12.5 MG tablet Take 1 tablet by mouth 2 (two) times daily.   cinacalcet (SENSIPAR) 30 MG tablet PLEASE SEE ATTACHED FOR DETAILED DIRECTIONS   clopidogrel (PLAVIX) 75 MG tablet TAKE 1 TABLET BY MOUTH DAILY   lidocaine-prilocaine (EMLA) cream Apply 1 Application topically daily as needed (prior to port being accessed.).   Methoxy PEG-Epoetin Beta (MIRCERA IJ) Inject into the skin.   Methoxy PEG-Epoetin Beta (MIRCERA IJ) Inject into the skin.   multivitamin (RENA-VIT) TABS tablet Take 1 tablet by mouth daily.   mupirocin cream (BACTROBAN) 2 % SMARTSIG:Sparingly Topical   nitroGLYCERIN (NITROSTAT) 0.4 MG SL tablet Place 1 tablet (0.4 mg total) under the tongue every 5 (five) minutes x 3 doses as needed for chest pain.   sevelamer carbonate (RENVELA) 800 MG tablet Take 800 mg by mouth with breakfast, with lunch, and with evening meal.   sildenafil (VIAGRA) 100 MG tablet Take 100 mg by mouth as needed for erectile dysfunction.   torsemide (DEMADEX) 100 MG tablet Take 100 mg by mouth every morning.    Allergies:   Oxycodone and Hydrocodone   Social History   Socioeconomic History   Marital status: Married    Spouse name: Not on file   Number of children: 3   Years of education: Not on file   Highest education level: Bachelor's degree (e.g., BA, AB, BS)  Occupational History    Comment: retired  Tobacco Use   Smoking status: Former    Current packs/day: 0.00    Average packs/day: 1 pack/day for 30.0 years (30.0 ttl pk-yrs)    Types: Cigarettes    Start date: 08/22/1977    Quit date: 08/23/2007    Years since quitting: 15.6     Passive exposure: Past   Smokeless tobacco: Never  Vaping Use   Vaping status: Never Used  Substance and Sexual Activity   Alcohol use: No   Drug use: No   Sexual activity: Not Currently    Birth control/protection: None  Other Topics Concern   Not on file  Social History Narrative   Not on file   Social Determinants of Health   Financial Resource Strain: Patient Declined (03/02/2023)   Overall Financial Resource Strain (CARDIA)    Difficulty of Paying Living Expenses: Patient declined  Food Insecurity: Patient Declined (03/02/2023)   Hunger Vital Sign    Worried About Running Out of Food in the Last Year: Patient declined    Ran Out of Food in the Last Year: Patient declined  Transportation Needs:  Patient Declined (03/02/2023)   PRAPARE - Administrator, Civil Service (Medical): Patient declined    Lack of Transportation (Non-Medical): Patient declined  Physical Activity: Patient Declined (03/02/2023)   Exercise Vital Sign    Days of Exercise per Week: Patient declined    Minutes of Exercise per Session: Patient declined  Stress: Patient Declined (03/02/2023)   [employer] of Occupational Health - Occupational Stress Questionnaire    Feeling of Stress : Patient declined  Social Connections: Unknown (03/02/2023)   Social Connection and Isolation Panel [NHANES]    Frequency of Communication with Friends and Family: Patient declined    Frequency of Social Gatherings with Friends and Family: Patient declined    Attends Religious Services: Not on Marketing executive or Organizations: Patient declined    Attends Banker Meetings: Patient declined    Marital Status: Patient declined     Family History:  The patient's family history includes Heart failure in his father; Psoriasis in his mother.  ROS:   12-point review of systems is negative unless otherwise noted in the HPI.   EKGs/Labs/Other Studies Reviewed:    Studies reviewed  were summarized above. The additional studies were reviewed today:  LHC 04/16/2023:   Dist LAD lesion is 30% stenosed.   Ost LAD to Prox LAD lesion is 30% stenosed.   Ost RCA to Prox RCA lesion is 30% stenosed.   Mid LAD lesion is 10% stenosed.   Mid LAD to Dist LAD lesion is 50% stenosed.   Non-stenotic Prox LAD to Mid LAD lesion was previously treated.   The left ventricular systolic function is normal.   LV end diastolic pressure is moderately elevated.   The left ventricular ejection fraction is 55-65% by visual estimate.   1.  Widely patent LAD stent with minimal restenosis.  Moderately to severely calcified coronary arteries with no obstructive disease. 2.  Normal LV systolic function and moderately elevated left ventricular end-diastolic pressure.   Recommendations: Continue medical therapy for coronary artery disease.   From a cardiac standpoint, I do not see a contraindication for kidney transplant. Avoid catheterization via the right radial artery in the future due to calcified and tortuous innominate artery with very difficult engagement of the coronary arteries. __________  Stress cMRI of 08/06/2022 ([facility]): 1. Abnormal stress perfusion study. Diffuse perfusion defect of all basal-mid LV segments extending beyond  areas of scarring suggestive of mixed ischemia and infarction; given diffuse distribution involving all  basal-mid LV segments, this study is suggestive of multivessel disease.  2. Normal LV size and systolic function; LVEF 74%. Moderate concentric hypertrophy with increased LV mass  125g-m2.  3. Normal RV size and systolic function; RVEF 74%.  4. Mild mitral and tricuspid regurgitation.  5. Diffuse patchy subendocardial fibrosis of all basal-mid LV segments - this may represent non-ischemic  scarring related to hypertensive heart disease as well as areas of infarction; LV scar size ~22%. ECV in  areas without scar ~33% consistent with mild diffuse interstitial  fibrosis.  __________   Eugenie Birks MPI 04/29/2022 ([facility]):   ECG test result was negative for evidence of ischemia. There was no  chest pain during the exam.    Myocardial perfusion imaging test is normal.    Overall left ventricular systolic function was mildly reduced. The  calculated ejection fraction was measured at 45%.    Low cardiovascular risk    No prior study.  __________   2D echo 04/29/2022 (  [facility]): 1. Left ventricle: The left ventricular cavity size is normal. Wall     thickness is normal. Systolic function is normal. The ejection fraction     is 65% (+/-5%), estimated visually. Overall assessment of diastolic     function is normal with normal estimate of left atrial pressure.  2. Right ventricle: The right ventricular cavity size is at the upper limits     of normal. Systolic function is normal as estimated by visual and     quantitative measures.  3. Pericardium, extracardiac: A trivial pericardial effusion is identified.     There is no evidence of hemodynamic compromise.  4. No significant valve stenosis or regurgitation.  __________   LHC 04/24/2020: Ost RCA to Prox RCA lesion is 30% stenosed. Ost LAD to Prox LAD lesion is 40% stenosed. Prox LAD to Mid LAD lesion is 99% stenosed. Post intervention, there is a 0% residual stenosis. A drug-eluting stent was successfully placed using a STENT RESOLUTE ONYX 4.0X15. Mid LAD lesion is 90% stenosed. Post intervention, there is a 0% residual stenosis. A drug-eluting stent was successfully placed using a STENT RESOLUTE ONYX 3.0X15. Mid LAD to Dist LAD lesion is 60% stenosed. Dist LAD lesion is 30% stenosed.   1.  Left dominant coronary arteries with severe one-vessel coronary artery disease involving proximal LAD with 99% stenosis and mid LAD with 90% stenosis.  There is moderate LAD disease in the mid to distal segment that was left to be treated medically. 2.  Left ventricular angiography was not performed due to  chronic kidney disease.  LVEDP was moderately elevated at 25 mmHg. 3.  Successful IVUS guided PCI and drug-eluting stent placement to the mid and proximal LAD.   Recommendations: Dual antiplatelet therapy for at least 1 year. Aggressive treatment of risk factors. Given the patient's advanced chronic kidney disease, I am going to observe him overnight with hydration.  Recheck renal function in the morning.  A total of 80 mL of contrast was used for the procedure. __________   Eugenie Birks MPI 03/02/2020: IMPRESSION: 1. Potential small area of pharmacologically induced ischemia involving the apex of the left ventricle. Clinical correlation is advised. 2. No scintigraphic evidence of prior infarction. 3. Mild global hypokinesia, slightly more conspicuously involving the basilar aspect of the inferior wall of the left ventricle. 4. Ejection fraction - 47%. __________   2D echo 03/01/2020: 1. Left ventricular ejection fraction, by estimation, is 50 to 55%. The  left ventricle has low normal function. The left ventricle demonstrates  regional wall motion abnormalities (see scoring diagram/findings for  description). Left ventricular diastolic   parameters are consistent with Grade I diastolic dysfunction (impaired  relaxation). There is moderate hypokinesis of the left ventricular, basal  inferior wall.   2. Right ventricular systolic function is normal. The right ventricular  size is normal.   3. The mitral valve is normal in structure. No evidence of mitral valve  regurgitation.   4. The aortic valve is normal in structure. Aortic valve regurgitation is  not visualized. Mild aortic valve sclerosis is present, with no evidence  of aortic valve stenosis.   5. The inferior vena cava is dilated in size with >50% respiratory  variability, suggesting right atrial pressure of 8 mmHg.    EKG:  EKG is ordered today.  The EKG ordered today demonstrates NSR, 75 bpm, 1st degree AV block, baseline  wandering, nonspecific st/t changes, Poor R wave progression along the precordial leads, consistent with prior tracing  Recent  Labs: 04/12/2023: ALT 18; BUN 66; Creatinine, Ser 8.69; Hemoglobin 10.7; Platelets 147; Potassium 4.7; Sodium 139  Recent Lipid Panel    Component Value Date/Time   CHOL 87 (L) 04/12/2023 1034   TRIG 108 04/12/2023 1034   HDL 24 (L) 04/12/2023 1034   CHOLHDL 3.6 04/12/2023 1034   CHOLHDL 4.9 03/02/2020 0111   VLDL 21 03/02/2020 0111   LDLCALC 43 04/12/2023 1034    PHYSICAL EXAM:    VS:  BP (!) 142/80 (BP Location: Right Arm, Patient Position: Sitting, Cuff Size: Normal)   Pulse 75   Ht 5\' 10"  (1.778 m)   Wt 226 lb 3.2 oz (102.6 kg)   SpO2 92%   BMI 32.46 kg/m   BMI: Body mass index is 32.46 kg/m.  Physical Exam Constitutional:      Appearance: He is well-developed.  HENT:     Head: Normocephalic and atraumatic.  Eyes:     General:        Right eye: No discharge.        Left eye: No discharge.  Neck:     Vascular: No JVD.  Cardiovascular:     Rate and Rhythm: Normal rate and regular rhythm.     Heart sounds: Normal heart sounds, S1 normal and S2 normal. Heart sounds not distant. No midsystolic click and no opening snap. No murmur heard.    No friction rub.     Comments: Right radial artery arteriotomy is without active bleeding, bruising, swelling, warmth, erythema, or tenderness to palpation.  Radial pulse 2+ proximal and distal to the arteriotomy site.   Pulmonary:     Effort: Pulmonary effort is normal. No respiratory distress.     Breath sounds: Normal breath sounds. No decreased breath sounds, wheezing or rales.  Chest:     Chest wall: No tenderness.  Abdominal:     General: There is no distension.  Musculoskeletal:     Cervical back: Normal range of motion.  Skin:    General: Skin is warm and dry.     Nails: There is no clubbing.  Neurological:     Mental Status: He is alert and oriented to person, place, and time.  Psychiatric:         Speech: Speech normal.        Behavior: Behavior normal.        Thought Content: Thought content normal.        Judgment: Judgment normal.     Wt Readings from Last 3 Encounters:  04/30/23 226 lb 3.2 oz (102.6 kg)  04/20/23 232 lb (105.2 kg)  04/16/23 227 lb 4.8 oz (103.1 kg)     ASSESSMENT & PLAN:   CAD involving native coronary arteries without angina: He is without symptoms of angina or cardiac decompensation.  LHC earlier this month that showed patent LAD stent with otherwise moderate to severely calcified arteries without obstructive disease.  Continue aggressive risk factor modification and secondary prevention including aspirin, clopidogrel, amlodipine, atorvastatin, and carvedilol.  No right radial arterial site complications.  No indication for further ischemic testing at this time.  Cardiac fibrosis: Stress MRI in 08/2022 notable for diffuse perfusion defect involving basal to mid LV segments extending beyond areas of scarring suggestive of mixed ischemia and infarction and suggestive of multivessel disease.  Obstructive CAD excluded by LHC as outlined above.  However, stress MRI notable for diffuse patchy subendocardial fibrosis global basal mid LV segments possibly representative of nonischemic scarring related to hypertensive heart disease versus alternative  process such as cardiac amyloidosis; including in the clinical context of concern for moderate concentric LVH, subendocardial fibrosis involving the basal and mid LV segments, possible bilateral carpal tunnel disease, and proteinuria noted on UA.  Schedule echo with particular attention to strain and cMRI.  HTN: Blood pressure is mildly elevated in the office, and improved with dialysis.  Defer escalation of antihypertensive therapy at this time in an effort to minimize risk of hypotension associated with hemodialysis.  HLD: LDL 43 in 04/2023, with normal AST/ALT at that time.  Remains on atorvastatin 80 mg.  PAD: Status  post intervention.  Followed by vascular surgery.  ESRD on HD with anemia of chronic disease: Management per nephrology.    Disposition: F/u with Dr. Mariah Milling or an APP after echo and cMRI.   Medication Adjustments/Labs and Tests Ordered: Current medicines are reviewed at length with the patient today.  Concerns regarding medicines are outlined above. Medication changes, Labs and Tests ordered today are summarized above and listed in the Patient Instructions accessible in Encounters.   Elinor Dodge, PA-C 04/30/2023 12:02 PM     [facility] [place] [address] [place], [place] [address] [phone]

## 2023-04-28 DIAGNOSIS — N186 End stage renal disease: Secondary | ICD-10-CM | POA: Diagnosis not present

## 2023-04-28 DIAGNOSIS — N2581 Secondary hyperparathyroidism of renal origin: Secondary | ICD-10-CM | POA: Diagnosis not present

## 2023-04-28 DIAGNOSIS — Z992 Dependence on renal dialysis: Secondary | ICD-10-CM | POA: Diagnosis not present

## 2023-04-30 ENCOUNTER — Encounter: Payer: Self-pay | Admitting: Physician Assistant

## 2023-04-30 ENCOUNTER — Ambulatory Visit: Payer: Medicare HMO | Attending: Physician Assistant | Admitting: Physician Assistant

## 2023-04-30 VITALS — BP 142/80 | HR 75 | Ht 70.0 in | Wt 226.2 lb

## 2023-04-30 DIAGNOSIS — R9389 Abnormal findings on diagnostic imaging of other specified body structures: Secondary | ICD-10-CM

## 2023-04-30 DIAGNOSIS — Z992 Dependence on renal dialysis: Secondary | ICD-10-CM | POA: Diagnosis not present

## 2023-04-30 DIAGNOSIS — I10 Essential (primary) hypertension: Secondary | ICD-10-CM

## 2023-04-30 DIAGNOSIS — N2581 Secondary hyperparathyroidism of renal origin: Secondary | ICD-10-CM | POA: Diagnosis not present

## 2023-04-30 DIAGNOSIS — I2510 Atherosclerotic heart disease of native coronary artery without angina pectoris: Secondary | ICD-10-CM

## 2023-04-30 DIAGNOSIS — D638 Anemia in other chronic diseases classified elsewhere: Secondary | ICD-10-CM

## 2023-04-30 DIAGNOSIS — N186 End stage renal disease: Secondary | ICD-10-CM

## 2023-04-30 DIAGNOSIS — E785 Hyperlipidemia, unspecified: Secondary | ICD-10-CM

## 2023-04-30 NOTE — Patient Instructions (Addendum)
Medication Instructions:  Your Physician recommend you continue on your current medication as directed.    *If you need a refill on your cardiac medications before your next appointment, please call your pharmacy*  Lab Work:  Your provider would like for you to have following labs drawn today CBC and BMP.   If you have any lab test that is abnormal or we need to change your treatment, we will call you to review the results.   Testing/Procedures: Your physician has requested that you have an echocardiogram. Echocardiography is a painless test that uses sound waves to create images of your heart. It provides your doctor with information about the size and shape of your heart and how well your heart's chambers and valves are working.   You may receive an ultrasound enhancing agent through an IV if needed to better visualize your heart during the echo. This procedure takes approximately one hour.  There are no restrictions for this procedure.  This will take place at [address] ([facility]) [phone], [place] [address]      [facility] [address] [place], [place] [address] [phone] Please go to the [facility] and check-in with the desk attendant.   Magnetic resonance imaging (MRI) is a painless test that produces images of the inside of the body without using Xrays.  During an MRI, strong magnets and radio waves work together in a Data processing manager to form detailed images.   MRI images may provide more details about a medical condition than X-rays, CT scans, and ultrasounds can provide.  You may be given earphones to listen for instructions.  You may eat a light breakfast and take medications as ordered with the exception of furosemide, hydrochlorothiazide, or spironolactone(fluid pill, other). Please avoid stimulants for 12 hr prior to test. (Ie. Caffeine, nicotine, chocolate, or antihistamine medications)  An IV will be inserted  into one of your veins. Contrast material will be injected into your IV. It will leave your body through your urine within a day. You may be told to drink plenty of fluids to help flush the contrast material out of your system.  You will be asked to remove all metal, including: Watch, jewelry, and other metal objects including hearing aids, hair pieces and dentures. Also wearable glucose monitoring systems (ie. Freestyle Libre and Omnipods) (Braces and fillings normally are not a problem.)   TEST WILL TAKE APPROXIMATELY 1 HOUR  PLEASE NOTIFY SCHEDULING AT LEAST 24 HOURS IN ADVANCE IF YOU ARE UNABLE TO KEEP YOUR APPOINTMENT. [phone]  For more information and frequently asked questions, please visit our website : [email or web address]  Please call the Cardiac Imaging Nurse Navigators with any questions/concerns. [phone] Office     Follow-Up: At [facility], you and your health needs are our priority.  As part of our continuing mission to provide you with exceptional heart care, we have created designated Provider Care Teams.  These Care Teams include your primary Cardiologist (physician) and Advanced Practice Providers (APPs -  Physician Assistants and Nurse Practitioners) who all work together to provide you with the care you need, when you need it.  We recommend signing up for the patient portal called "MyChart".  Sign up information is provided on this After Visit Summary.  MyChart is used to connect with patients for Virtual Visits (Telemedicine).  Patients are able to view lab/test results, encounter notes, upcoming appointments, etc.  Non-urgent messages can be sent to your provider as well.  To learn more about what you can do with MyChart, go to [email or web address].    Your next appointment:   1 week(s) after your Echo and Cardiac MRI  Provider:   You may see Julien Nordmann, MD or one of the following Advanced Practice Providers on your designated  Care Team:   Eula Listen, PA-C  Other Instructions Your physician has requested that you have a cardiac MRI. Cardiac MRI uses a computer to create images of your heart as its beating, producing both still and moving pictures of your heart and major blood vessels. For further information please visit [email or web address]. Please follow the instruction sheet given to you today for more information.   Jeremy Dorsey

## 2023-05-01 LAB — COMPREHENSIVE METABOLIC PANEL
ALT: 24 [IU]/L (ref 0–44)
AST: 19 [IU]/L (ref 0–40)
Albumin: 4.5 g/dL (ref 3.8–4.8)
Alkaline Phosphatase: 110 [IU]/L (ref 44–121)
BUN/Creatinine Ratio: 6 — ABNORMAL LOW (ref 10–24)
BUN: 51 mg/dL — ABNORMAL HIGH (ref 8–27)
Bilirubin Total: 0.3 mg/dL (ref 0.0–1.2)
CO2: 25 mmol/L (ref 20–29)
Calcium: 9.6 mg/dL (ref 8.6–10.2)
Chloride: 95 mmol/L — ABNORMAL LOW (ref 96–106)
Creatinine, Ser: 8.36 mg/dL — ABNORMAL HIGH (ref 0.76–1.27)
Globulin, Total: 2.1 g/dL (ref 1.5–4.5)
Glucose: 148 mg/dL — ABNORMAL HIGH (ref 70–99)
Potassium: 4 mmol/L (ref 3.5–5.2)
Sodium: 140 mmol/L (ref 134–144)
Total Protein: 6.6 g/dL (ref 6.0–8.5)
eGFR: 6 mL/min/{1.73_m2} — ABNORMAL LOW (ref >59)

## 2023-05-01 LAB — CBC
Hematocrit: 32.5 % — ABNORMAL LOW (ref 37.5–51.0)
Hemoglobin: 10.7 g/dL — ABNORMAL LOW (ref 13.0–17.7)
MCH: 31.5 pg (ref 26.6–33.0)
MCHC: 32.9 g/dL (ref 31.5–35.7)
MCV: 96 fL (ref 79–97)
Platelets: 162 10*3/uL (ref 150–450)
RBC: 3.4 x10E6/uL — ABNORMAL LOW (ref 4.14–5.80)
RDW: 11.9 % (ref 11.6–15.4)
WBC: 6.6 10*3/uL (ref 3.4–10.8)

## 2023-05-03 DIAGNOSIS — I129 Hypertensive chronic kidney disease with stage 1 through stage 4 chronic kidney disease, or unspecified chronic kidney disease: Secondary | ICD-10-CM | POA: Diagnosis not present

## 2023-05-03 DIAGNOSIS — N2581 Secondary hyperparathyroidism of renal origin: Secondary | ICD-10-CM | POA: Diagnosis not present

## 2023-05-03 DIAGNOSIS — Z992 Dependence on renal dialysis: Secondary | ICD-10-CM | POA: Diagnosis not present

## 2023-05-03 DIAGNOSIS — N186 End stage renal disease: Secondary | ICD-10-CM | POA: Diagnosis not present

## 2023-05-04 DIAGNOSIS — G5602 Carpal tunnel syndrome, left upper limb: Secondary | ICD-10-CM | POA: Diagnosis not present

## 2023-05-04 DIAGNOSIS — G5603 Carpal tunnel syndrome, bilateral upper limbs: Secondary | ICD-10-CM | POA: Diagnosis not present

## 2023-05-05 DIAGNOSIS — Z992 Dependence on renal dialysis: Secondary | ICD-10-CM | POA: Diagnosis not present

## 2023-05-05 DIAGNOSIS — N186 End stage renal disease: Secondary | ICD-10-CM | POA: Diagnosis not present

## 2023-05-05 DIAGNOSIS — N2581 Secondary hyperparathyroidism of renal origin: Secondary | ICD-10-CM | POA: Diagnosis not present

## 2023-05-07 DIAGNOSIS — Z992 Dependence on renal dialysis: Secondary | ICD-10-CM | POA: Diagnosis not present

## 2023-05-07 DIAGNOSIS — N2581 Secondary hyperparathyroidism of renal origin: Secondary | ICD-10-CM | POA: Diagnosis not present

## 2023-05-07 DIAGNOSIS — N186 End stage renal disease: Secondary | ICD-10-CM | POA: Diagnosis not present

## 2023-05-10 DIAGNOSIS — N2581 Secondary hyperparathyroidism of renal origin: Secondary | ICD-10-CM | POA: Diagnosis not present

## 2023-05-10 DIAGNOSIS — Z992 Dependence on renal dialysis: Secondary | ICD-10-CM | POA: Diagnosis not present

## 2023-05-10 DIAGNOSIS — N186 End stage renal disease: Secondary | ICD-10-CM | POA: Diagnosis not present

## 2023-05-12 DIAGNOSIS — Z992 Dependence on renal dialysis: Secondary | ICD-10-CM | POA: Diagnosis not present

## 2023-05-12 DIAGNOSIS — N2581 Secondary hyperparathyroidism of renal origin: Secondary | ICD-10-CM | POA: Diagnosis not present

## 2023-05-12 DIAGNOSIS — N186 End stage renal disease: Secondary | ICD-10-CM | POA: Diagnosis not present

## 2023-05-14 DIAGNOSIS — N2581 Secondary hyperparathyroidism of renal origin: Secondary | ICD-10-CM | POA: Diagnosis not present

## 2023-05-14 DIAGNOSIS — N186 End stage renal disease: Secondary | ICD-10-CM | POA: Diagnosis not present

## 2023-05-14 DIAGNOSIS — Z992 Dependence on renal dialysis: Secondary | ICD-10-CM | POA: Diagnosis not present

## 2023-05-17 DIAGNOSIS — Z992 Dependence on renal dialysis: Secondary | ICD-10-CM | POA: Diagnosis not present

## 2023-05-17 DIAGNOSIS — N2581 Secondary hyperparathyroidism of renal origin: Secondary | ICD-10-CM | POA: Diagnosis not present

## 2023-05-17 DIAGNOSIS — N186 End stage renal disease: Secondary | ICD-10-CM | POA: Diagnosis not present

## 2023-05-19 DIAGNOSIS — N186 End stage renal disease: Secondary | ICD-10-CM | POA: Diagnosis not present

## 2023-05-19 DIAGNOSIS — Z992 Dependence on renal dialysis: Secondary | ICD-10-CM | POA: Diagnosis not present

## 2023-05-19 DIAGNOSIS — N2581 Secondary hyperparathyroidism of renal origin: Secondary | ICD-10-CM | POA: Diagnosis not present

## 2023-05-21 ENCOUNTER — Ambulatory Visit: Payer: Medicare HMO | Attending: Physician Assistant

## 2023-05-21 DIAGNOSIS — Z992 Dependence on renal dialysis: Secondary | ICD-10-CM | POA: Diagnosis not present

## 2023-05-21 DIAGNOSIS — N2581 Secondary hyperparathyroidism of renal origin: Secondary | ICD-10-CM | POA: Diagnosis not present

## 2023-05-21 DIAGNOSIS — N186 End stage renal disease: Secondary | ICD-10-CM | POA: Diagnosis not present

## 2023-05-21 DIAGNOSIS — I2510 Atherosclerotic heart disease of native coronary artery without angina pectoris: Secondary | ICD-10-CM

## 2023-05-21 LAB — ECHOCARDIOGRAM COMPLETE
AR max vel: 2.87 cm2
AV Area VTI: 2.83 cm2
AV Area mean vel: 2.74 cm2
AV Mean grad: 3 mm[Hg]
AV Peak grad: 5.3 mm[Hg]
Ao pk vel: 1.15 m/s
Area-P 1/2: 3.85 cm2
S' Lateral: 4.15 cm

## 2023-05-24 DIAGNOSIS — N2581 Secondary hyperparathyroidism of renal origin: Secondary | ICD-10-CM | POA: Diagnosis not present

## 2023-05-24 DIAGNOSIS — Z992 Dependence on renal dialysis: Secondary | ICD-10-CM | POA: Diagnosis not present

## 2023-05-24 DIAGNOSIS — N186 End stage renal disease: Secondary | ICD-10-CM | POA: Diagnosis not present

## 2023-05-26 DIAGNOSIS — Z992 Dependence on renal dialysis: Secondary | ICD-10-CM | POA: Diagnosis not present

## 2023-05-26 DIAGNOSIS — N2581 Secondary hyperparathyroidism of renal origin: Secondary | ICD-10-CM | POA: Diagnosis not present

## 2023-05-26 DIAGNOSIS — N186 End stage renal disease: Secondary | ICD-10-CM | POA: Diagnosis not present

## 2023-05-28 ENCOUNTER — Encounter (HOSPITAL_COMMUNITY): Payer: Self-pay

## 2023-05-28 DIAGNOSIS — Z992 Dependence on renal dialysis: Secondary | ICD-10-CM | POA: Diagnosis not present

## 2023-05-28 DIAGNOSIS — N186 End stage renal disease: Secondary | ICD-10-CM | POA: Diagnosis not present

## 2023-05-28 DIAGNOSIS — N2581 Secondary hyperparathyroidism of renal origin: Secondary | ICD-10-CM | POA: Diagnosis not present

## 2023-05-31 ENCOUNTER — Ambulatory Visit: Payer: Medicare HMO | Admitting: Physician Assistant

## 2023-05-31 DIAGNOSIS — N186 End stage renal disease: Secondary | ICD-10-CM | POA: Diagnosis not present

## 2023-05-31 DIAGNOSIS — N2581 Secondary hyperparathyroidism of renal origin: Secondary | ICD-10-CM | POA: Diagnosis not present

## 2023-05-31 DIAGNOSIS — Z992 Dependence on renal dialysis: Secondary | ICD-10-CM | POA: Diagnosis not present

## 2023-06-02 ENCOUNTER — Other Ambulatory Visit: Payer: Self-pay | Admitting: Physician Assistant

## 2023-06-02 ENCOUNTER — Encounter: Payer: Self-pay | Admitting: Cardiovascular Disease

## 2023-06-02 ENCOUNTER — Other Ambulatory Visit: Payer: Self-pay | Admitting: Emergency Medicine

## 2023-06-02 ENCOUNTER — Ambulatory Visit (INDEPENDENT_AMBULATORY_CARE_PROVIDER_SITE_OTHER): Payer: Medicare HMO

## 2023-06-02 ENCOUNTER — Ambulatory Visit
Admission: RE | Admit: 2023-06-02 | Discharge: 2023-06-02 | Disposition: A | Discharge status: Home / Self Care | Payer: Medicare HMO | Source: Ambulatory Visit | Attending: Physician Assistant | Admitting: Physician Assistant

## 2023-06-02 DIAGNOSIS — D8685 Sarcoid myocarditis: Secondary | ICD-10-CM | POA: Insufficient documentation

## 2023-06-02 DIAGNOSIS — R9389 Abnormal findings on diagnostic imaging of other specified body structures: Secondary | ICD-10-CM | POA: Diagnosis not present

## 2023-06-02 DIAGNOSIS — I2510 Atherosclerotic heart disease of native coronary artery without angina pectoris: Secondary | ICD-10-CM | POA: Diagnosis not present

## 2023-06-02 DIAGNOSIS — N186 End stage renal disease: Secondary | ICD-10-CM | POA: Diagnosis not present

## 2023-06-02 DIAGNOSIS — N2581 Secondary hyperparathyroidism of renal origin: Secondary | ICD-10-CM | POA: Diagnosis not present

## 2023-06-02 DIAGNOSIS — I43 Cardiomyopathy in diseases classified elsewhere: Secondary | ICD-10-CM

## 2023-06-02 DIAGNOSIS — Z23 Encounter for immunization: Secondary | ICD-10-CM | POA: Diagnosis not present

## 2023-06-02 DIAGNOSIS — Z992 Dependence on renal dialysis: Secondary | ICD-10-CM | POA: Diagnosis not present

## 2023-06-02 MED ORDER — GADOBUTROL 1 MMOL/ML IV SOLN
13.0000 mL | Freq: Once | INTRAVENOUS | Status: AC | PRN
  Administered 2023-06-02: 13 mL via INTRAVENOUS

## 2023-06-02 NOTE — Progress Notes (Signed)
Patient tolerated vaccine well 

## 2023-06-03 DIAGNOSIS — I129 Hypertensive chronic kidney disease with stage 1 through stage 4 chronic kidney disease, or unspecified chronic kidney disease: Secondary | ICD-10-CM | POA: Diagnosis not present

## 2023-06-03 DIAGNOSIS — N186 End stage renal disease: Secondary | ICD-10-CM | POA: Diagnosis not present

## 2023-06-03 DIAGNOSIS — Z992 Dependence on renal dialysis: Secondary | ICD-10-CM | POA: Diagnosis not present

## 2023-06-04 DIAGNOSIS — N2581 Secondary hyperparathyroidism of renal origin: Secondary | ICD-10-CM | POA: Diagnosis not present

## 2023-06-04 DIAGNOSIS — Z992 Dependence on renal dialysis: Secondary | ICD-10-CM | POA: Diagnosis not present

## 2023-06-04 DIAGNOSIS — N186 End stage renal disease: Secondary | ICD-10-CM | POA: Diagnosis not present

## 2023-06-07 DIAGNOSIS — N186 End stage renal disease: Secondary | ICD-10-CM | POA: Diagnosis not present

## 2023-06-07 DIAGNOSIS — N2581 Secondary hyperparathyroidism of renal origin: Secondary | ICD-10-CM | POA: Diagnosis not present

## 2023-06-07 DIAGNOSIS — Z992 Dependence on renal dialysis: Secondary | ICD-10-CM | POA: Diagnosis not present

## 2023-06-09 DIAGNOSIS — Z992 Dependence on renal dialysis: Secondary | ICD-10-CM | POA: Diagnosis not present

## 2023-06-09 DIAGNOSIS — N2581 Secondary hyperparathyroidism of renal origin: Secondary | ICD-10-CM | POA: Diagnosis not present

## 2023-06-09 DIAGNOSIS — N186 End stage renal disease: Secondary | ICD-10-CM | POA: Diagnosis not present

## 2023-06-10 ENCOUNTER — Encounter: Payer: Self-pay | Admitting: Cardiovascular Disease

## 2023-06-10 ENCOUNTER — Ambulatory Visit: Payer: Medicare HMO | Attending: Cardiovascular Disease | Admitting: Cardiovascular Disease

## 2023-06-10 VITALS — BP 130/68 | HR 75 | Ht 70.0 in | Wt 229.0 lb

## 2023-06-10 DIAGNOSIS — G5603 Carpal tunnel syndrome, bilateral upper limbs: Secondary | ICD-10-CM

## 2023-06-10 DIAGNOSIS — E1159 Type 2 diabetes mellitus with other circulatory complications: Secondary | ICD-10-CM

## 2023-06-10 DIAGNOSIS — I25118 Atherosclerotic heart disease of native coronary artery with other forms of angina pectoris: Secondary | ICD-10-CM | POA: Diagnosis not present

## 2023-06-10 DIAGNOSIS — I10 Essential (primary) hypertension: Secondary | ICD-10-CM | POA: Diagnosis not present

## 2023-06-10 DIAGNOSIS — N186 End stage renal disease: Secondary | ICD-10-CM | POA: Diagnosis not present

## 2023-06-10 DIAGNOSIS — Z992 Dependence on renal dialysis: Secondary | ICD-10-CM

## 2023-06-10 DIAGNOSIS — N185 Chronic kidney disease, stage 5: Secondary | ICD-10-CM

## 2023-06-10 DIAGNOSIS — E782 Mixed hyperlipidemia: Secondary | ICD-10-CM

## 2023-06-10 NOTE — Progress Notes (Signed)
Cardiology Office Note  Date:  06/10/2023   ID:  Jeremy Dorsey, DOB [birth year], MRN [id number]  PCP:  Ronnald Ramp, MD   Chief Complaint  Patient presents with   PHQ-9 12 Week Follow-up    Discuss Echo results. Medications reviewed by the patient verbally.     HPI:  [name] is a 72 year old gentleman with past medical history of Coronary artery disease hypertension diabetes type 2 Chronic kidney disease stage IV Morbid obesity Smoker, stopped 2009,  Hyperlipidemia Presenting to the hospital March 01, 2020 with arm and jaw pain, non-STEMI Catheterization deferred secondary to concern for contrast nephropathy Peak troponin 2,200 Stent placed September 2021 to mid and proximal LAD Cardiac catheterization September 2024, medical management recommended He presents for f/u of  his coronary disease, s/p cath  Last seen by myself in clinic May 2023 Last Seen by one of our providers April 30, 2023 Have been taken off the transplant list secondary abnormal cardiac MRI  Left heart catheterization April 16, 2023 widely patent LAD stent with minimal restenosis. Moderately to severely calcified coronary arteries with no obstructive disease with ostial to proximal LAD 30% stenosis, mid LAD 10% stenosis, mid to distal LAD 50% stenosis, distal LAD 30% stenosis and ostial to proximal RCA 30% stenosis. LVEF 55 to 65%. Moderately elevated LVEDP.   Echocardiogram May 21, 2023  EF 55 to 60%, grade 2 diastolic dysfunction Severely dilated left atrium, mild MR  Cardiac MRI June 02, 2023 EF normal, 57%, no focal wall motion abnormality diffuse mid wall late gadolinium enhancement basal left ventricular myocardium Normal RV size and function Recommendation made to consider workup for amyloid due to diffuse basal LGE  Has appointment with advanced heart failure clinic in [facility] tomorrow  In general reports he feels well, activity limited by proximal leg  weakness He feels this is secondary to inactivity and deconditioning   Reports pain in his wrists, hands, concerned about carpal tunnel syndrome Reports he is scheduled for workup next month  Lab work reviewed A1C in 5/23: 6.9 Total cholesterol 80  Remains on aspirin and Plavix Had lower extremity PV procedure November 2023  Reports he is off the kidney transplant list secondary to recent cardiac issues  No PND orthopnea, no significant lower extremity edema  EKG from September 2024 reviewed  PMH:   has a past medical history of Anemia, Cancer (HCC), Cataract (2016), CKD (chronic kidney disease), stage IV (HCC), Coronary artery disease, Diabetes mellitus with nephropathy (HCC) (2008), Hyperlipidemia LDL goal <70, Hypertension (2008), Lower extremity edema, Shoulder pain, Stroke (HCC), and Urinary complication.  PSH:    Past Surgical History:  Procedure Laterality Date   ABDOMINAL AORTOGRAM W/LOWER EXTREMITY N/A 01/29/2022   Procedure: ABDOMINAL AORTOGRAM W/ Bilateral LOWER EXTREMITY [place];  Surgeon: Cephus Shelling, MD;  [facility]: [facility];  Service: Cardiovascular;  Laterality: N/A;   ABDOMINAL AORTOGRAM W/LOWER EXTREMITY N/A 06/11/2022   Procedure: ABDOMINAL AORTOGRAM W/LOWER EXTREMITY;  Surgeon: Cephus Shelling, MD;  Location: [facility];  Service: Cardiovascular;  Laterality: N/A;   AV FISTULA PLACEMENT Left 10/31/2020   Procedure: LEFT UPPER EXTREMITY ARTERIOVENOUS (AV) FISTULA CREATION;  Surgeon: Maeola Harman, MD;  Location[facility] OR;  Service: Vascular;  Laterality: Left;   AV FISTULA PLACEMENT Left 07/09/2021   Procedure: LEFT ARM ARTERIOVENOUS (AV) FISTULA CREATION;  Surgeon: Nada Libman, MD;  Location: [facility] OR;  Service: Vascular;  Laterality: Left;   COLONOSCOPY  1990   COLONOSCOPY  06/09/2011  Dr Lemar Livings   CORONARY STENT INTERVENTION N/A 04/24/2020   Procedure: CORONARY STENT INTERVENTION;  Surgeon: Iran Ouch, MD;   [facility]: [facility];  Service: Cardiovascular;  Laterality: N/A;   CORONARY ULTRASOUND/IVUS N/A 04/24/2020   Procedure: Intravascular Ultrasound/IVUS;  Surgeon: Iran Ouch, MD;  [facility]: [facility];  Service: Cardiovascular;  Laterality: N/A;   CYSTOSCOPY W/ RETROGRADES Bilateral 08/01/2018   Procedure: CYSTOSCOPY WITH RETROGRADE PYELOGRAM;  Surgeon: Sondra Come, MD;  Location: [facility] ORS;  Service: Urology;  Laterality: Bilateral;   CYSTOSCOPY WITH BIOPSY N/A 08/01/2018   Procedure: CYSTOSCOPY WITH Bladder BIOPSY;  Surgeon: Sondra Come, MD;  Location: [facility] OR[facility];  Service: Urology;  Laterality: N/A;   CYSTOSCOPY WITH STENT PLACEMENT Right 08/01/2018   Procedure: CYSTOSCOPY WITH STENT PLACEMENT;  Surgeon: Sondra Come, MD;  Location: [facility] OR[facility];  Service: Urology;  Laterality: Right;   EYE SURGERY Right    laser surgery   FRACTURE SURGERY     Rt shoulder rotaor cuff   LEFT HEART CATH AND CORONARY ANGIOGRAPHY N/A 04/24/2020   Procedure: LEFT HEART CATH AND CORONARY ANGIOGRAPHY;  Surgeon: Iran Ouch, MD;  [facility];  Service: Cardiovascular;  Laterality: N/A;   LEFT HEART CATH AND CORONARY ANGIOGRAPHY Left 04/16/2023   Procedure: LEFT HEART CATH AND CORONARY ANGIOGRAPHY;  Surgeon: Iran Ouch, MD;  Location: [facility];  Service: Cardiovascular;  Laterality: Left;   PERIPHERAL VASCULAR BALLOON ANGIOPLASTY Right 01/29/2022   Procedure: PERIPHERAL VASCULAR BALLOON ANGIOPLASTY;  Surgeon: Cephus Shelling, MD;  [facility];  Service: Cardiovascular;  Laterality: Right;   PERIPHERAL VASCULAR BALLOON ANGIOPLASTY Right 06/11/2022   Procedure: PERIPHERAL VASCULAR BALLOON ANGIOPLASTY;  Surgeon: Cephus Shelling, MD;  [facility]: [facility];  Service: Cardiovascular;  Laterality: Right;  Peroneal   SHOULDER ARTHROSCOPY WITH OPEN ROTATOR CUFF REPAIR Right 08/25/2018   Procedure: SHOULDER ARTHROSCOPY WITH  MINI OPEN ROTATOR CUFF REPAIR;  Surgeon: Juanell Fairly, MD;  Location: [facility];  Service: Orthopedics;  Laterality: Right;   TRANSURETHRAL RESECTION OF BLADDER TUMOR N/A 08/01/2018   Procedure: TRANSURETHRAL RESECTION OF BLADDER TUMOR (TURBT);  Surgeon: Sondra Come, MD;  Location: [facility];  Service: Urology;  Laterality: N/A;   URETEROSCOPY WITH HOLMIUM LASER LITHOTRIPSY Right 08/01/2018   Procedure: URETEROSCOPY WITH HOLMIUM LASER LITHOTRIPSY;  Surgeon: Sondra Come, MD;  Location: [facility];  Service: Urology;  Laterality: Right;    Current Outpatient Medications  Medication Sig Dispense Refill   amLODipine (NORVASC) 5 MG tablet Take 5 mg by mouth every evening.     aspirin EC (ASPIRIN LOW DOSE) 81 MG tablet TAKE 1 TABLET BY MOUTH EVERY MORNING (SWALLOW WHOLE) 30 tablet 1   atorvastatin (LIPITOR) 80 MG tablet TAKE 1 TABLET BY MOUTH DAILY 30 tablet 1   AURYXIA 1 GM 210 MG(Fe) tablet Take by mouth.     calcitRIOL (ROCALTROL) 0.25 MCG capsule Take 0.25 mcg by mouth in the morning.     carvedilol (COREG) 12.5 MG tablet Take 1 tablet by mouth 2 (two) times daily.     cinacalcet (SENSIPAR) 30 MG tablet PLEASE SEE ATTACHED FOR DETAILED DIRECTIONS     clopidogrel (PLAVIX) 75 MG tablet TAKE 1 TABLET BY MOUTH DAILY 30 tablet 1   lidocaine-prilocaine (EMLA) cream Apply 1 Application topically daily as needed (prior to port being accessed.).     Methoxy PEG-Epoetin Beta (MIRCERA IJ) Inject into the skin.  Methoxy PEG-Epoetin Beta (MIRCERA IJ) Inject into the skin.     multivitamin (RENA-VIT) TABS tablet Take 1 tablet by mouth daily.     mupirocin cream (BACTROBAN) 2 % SMARTSIG:Sparingly Topical     nitroGLYCERIN (NITROSTAT) 0.4 MG SL tablet Place 1 tablet (0.4 mg total) under the tongue every 5 (five) minutes x 3 doses as needed for chest pain. 25 tablet 0   sevelamer carbonate (RENVELA) 800 MG tablet Take 800 mg by mouth with breakfast, with lunch, and with evening meal.     sildenafil  (VIAGRA) 100 MG tablet Take 100 mg by mouth as needed for erectile dysfunction.     torsemide (DEMADEX) 100 MG tablet Take 100 mg by mouth every morning.     No current facility-administered medications for this visit.     Allergies:   Oxycodone and Hydrocodone   Social History:  The patient  reports that he quit smoking about 15 years ago. His smoking use included cigarettes. He started smoking about 45 years ago. He has a 30 pack-year smoking history. He has been exposed to tobacco smoke. He has never used smokeless tobacco. He reports that he does not drink alcohol and does not use drugs.   Family History:   family history includes Heart failure in his father; Psoriasis in his mother.    Review of Systems: Review of Systems  Constitutional: Negative.   HENT: Negative.    Respiratory: Negative.    Cardiovascular: Negative.   Gastrointestinal: Negative.   Musculoskeletal: Negative.   Neurological: Negative.   Psychiatric/Behavioral: Negative.    All other systems reviewed and are negative.   PHYSICAL EXAM: VS:  BP 130/68 (BP Location: Left Arm, Patient Position: Sitting, Cuff Size: Normal)   Pulse 75   Ht 5\' 10"  (1.778 m)   Wt 229 lb (103.9 kg)   SpO2 95%   BMI 32.86 kg/m  , BMI Body mass index is 32.86 kg/m. Constitutional:  oriented to person, place, and time. No distress.  HENT:  Head: Grossly normal Eyes:  no discharge. No scleral icterus.  Neck: No JVD, no carotid bruits  Cardiovascular: Regular rate and rhythm, no murmurs appreciated Pulmonary/Chest: Clear to auscultation bilaterally, no wheezes or rails Abdominal: Soft.  no distension.  no tenderness.  Musculoskeletal: Normal range of motion Neurological:  normal muscle tone. Coordination normal. No atrophy Skin: Skin warm and dry Psychiatric: normal affect, pleasant   Recent Labs: 04/30/2023: ALT 24; BUN 51; Creatinine, Ser 8.36; Hemoglobin 10.7; Platelets 162; Potassium 4.0; Sodium 140    Lipid  Panel Lab Results  Component Value Date   CHOL 87 (L) 04/12/2023   HDL 24 (L) 04/12/2023   LDLCALC 43 04/12/2023   TRIG 108 04/12/2023      Wt Readings from Last 3 Encounters:  06/10/23 229 lb (103.9 kg)  04/30/23 226 lb 3.2 oz (102.6 kg)  04/20/23 232 lb (105.2 kg)     ASSESSMENT AND PLAN:  Problem List Items Addressed This Visit       Cardiology Problems   CAD (coronary artery disease), native coronary artery - Primary   HLD (hyperlipidemia)   Essential hypertension     Other   Carpal tunnel syndrome   ESRD on dialysis (HCC)   CKD (chronic kidney disease), stage V (HCC)   Type 2 diabetes mellitus, without long-term current use of insulin (HCC)   Coronary artery disease with stable angina Stent x2 placed in the LAD proximal mid region Recent catheterization results reviewed with him Continue  aspirin Plavix Cholesterol at goal, A1c high 6 range, non-smoker Denies any general symptoms  Diabetes type 2 with complications We have encouraged continued exercise, careful diet management in an effort to lose weight.  Essential hypertension Blood pressure is well controlled on today's visit. No changes made to the medications.  Question amyloid, late gadolinium enhancement on cardiac MRI Scheduled to see advanced heart failure clinic tomorrow for consultation  Hyperlipidemia On Lipitor 80 daily  Cholesterol at goal  End-stage renal disease Followed by nephrology, reports he has been taken off the transplant list until cardiac workup completed On hemodialysis    Signed, Dossie Arbour, M.D., Ph.D. [facility] [place], [place] [phone]

## 2023-06-10 NOTE — Patient Instructions (Signed)
Medication Instructions:  No changes  If you need a refill on your cardiac medications before your next appointment, please call your pharmacy.    Lab work: No new labs needed   Testing/Procedures: No new testing needed   Follow-Up: At [facility], you and your health needs are our priority.  As part of our continuing mission to provide you with exceptional heart care, we have created designated Provider Care Teams.  These Care Teams include your primary Cardiologist (physician) and Advanced Practice Providers (APPs -  Physician Assistants and Nurse Practitioners) who all work together to provide you with the care you need, when you need it.  You will need a follow up appointment in 6 months  Providers on your designated Care Team:   Christopher Berge, NP Ryan Dunn, PA-C Cadence Furth, PA-C  COVID-19 Vaccine Information can be found at: [email or web address] For questions related to vaccine distribution or appointments, please email [email or web address] or call [phone].   

## 2023-06-11 ENCOUNTER — Ambulatory Visit: Payer: Medicare HMO | Attending: Internal Medicine | Admitting: Internal Medicine

## 2023-06-11 VITALS — BP 140/72 | HR 72 | Ht 70.0 in | Wt 229.0 lb

## 2023-06-11 DIAGNOSIS — I10 Essential (primary) hypertension: Secondary | ICD-10-CM

## 2023-06-11 DIAGNOSIS — N186 End stage renal disease: Secondary | ICD-10-CM

## 2023-06-11 DIAGNOSIS — N2581 Secondary hyperparathyroidism of renal origin: Secondary | ICD-10-CM | POA: Diagnosis not present

## 2023-06-11 DIAGNOSIS — Z992 Dependence on renal dialysis: Secondary | ICD-10-CM

## 2023-06-11 DIAGNOSIS — I25118 Atherosclerotic heart disease of native coronary artery with other forms of angina pectoris: Secondary | ICD-10-CM

## 2023-06-11 DIAGNOSIS — R931 Abnormal findings on diagnostic imaging of heart and coronary circulation: Secondary | ICD-10-CM | POA: Diagnosis not present

## 2023-06-11 NOTE — Progress Notes (Signed)
ADVANCED [facility] CLINIC CONSULT NOTE  Referring Physician: Julien Nordmann, MD Primary Care: Ronnald Ramp, MD Primary Cardiologist: Julien Nordmann, MD  HPI:  Jeremy Dorsey is a 72 year old male with CAD, HTN, DM2, ESRD referred by Dr. Mariah Milling for further evaluation of possible infiltrative cardiomyopathy.   Cardiac studies (all reviewed personally)   Left heart catheterization April 16, 2023 widely patent LAD stent with minimal restenosis. Moderately to severely calcified coronary arteries with no obstructive disease with ostial to proximal LAD 30% stenosis, mid LAD 10% stenosis, mid to distal LAD 50% stenosis, distal LAD 30% stenosis and ostial to proximal RCA 30% stenosis. LVEF 55 to 65%. Moderately elevated LVEDP.    Echocardiogram May 21, 2023  EF 55 to 60%, grade 2 diastolic dysfunction mild to moderate LVH Severely dilated left atrium, mild MR   Cardiac MRI June 02, 2023 EF normal, 57%, no focal wall motion abnormality diffuse mid wall late gadolinium enhancement basal left ventricular myocardium with apical sparing. ECV not calculated due to patient requesting test to stop Normal RV size and function Recommendation made to consider workup for amyloid due to diffuse basal LGE  Here with his wife. Previously was a Event organiser. Feels great. Active with yard work and other chores. Legs get tired at times. No CP, edema, undue SOB, orthopnea or PND,    Has been on home HD since May. Has h/o significant HTN and DM2. Trying to get on renal transplant list.    Past Medical History:  Diagnosis Date   Anemia    Cancer (HCC)    basal cell nose and forehead   Cataract 2016   Cataract surgery bith eyes   CKD (chronic kidney disease), stage IV (HCC)    Coronary artery disease    a. s/p IVUS-guided DESx2 to prox & mid LAD, residual disease treated medically. EF 50-55% by recent echo 02/2020.   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     aspirin EC (ASPIRIN LOW DOSE) 81 MG tablet TAKE 1 TABLET BY MOUTH EVERY MORNING (SWALLOW WHOLE) 30 tablet 1   atorvastatin (LIPITOR) 80 MG tablet TAKE 1 TABLET BY MOUTH DAILY 30 tablet 1   AURYXIA 1 GM 210 MG(Fe) tablet Take by mouth.     calcitRIOL (ROCALTROL) 0.25 MCG capsule Take 0.25 mcg by mouth in the morning.     carvedilol (COREG) 12.5 MG tablet Take 1 tablet by mouth 2 (two) times daily.     cinacalcet (SENSIPAR) 30 MG tablet PLEASE SEE ATTACHED FOR DETAILED DIRECTIONS     clopidogrel (PLAVIX) 75 MG tablet TAKE 1 TABLET BY MOUTH DAILY 30 tablet 1   lidocaine-prilocaine (EMLA) cream Apply 1 Application topically daily as needed (prior to port being accessed.).     Methoxy PEG-Epoetin Beta (MIRCERA IJ) Inject into the skin.     Methoxy PEG-Epoetin Beta (MIRCERA IJ) Inject into the skin.     multivitamin (RENA-VIT) TABS tablet Take 1 tablet by mouth daily.     mupirocin cream (BACTROBAN) 2 % SMARTSIG:Sparingly Topical     nitroGLYCERIN (NITROSTAT) 0.4 MG SL tablet Place  1 tablet (0.4 mg total) under the tongue every 5 (five) minutes x 3 doses as needed for chest pain. 25 tablet 0   sildenafil (VIAGRA) 100 MG tablet Take 100 mg by mouth as needed for erectile dysfunction.     torsemide (DEMADEX) 100 MG tablet Take 100 mg by mouth every morning.     sevelamer carbonate (RENVELA) 800 MG tablet Take 800 mg by mouth with breakfast, with lunch, and with evening meal.     No current facility-administered medications for this visit.    Allergies  Allergen Reactions   Oxycodone Nausea And Vomiting   Hydrocodone Nausea And Vomiting      Social History   Socioeconomic History   Marital  status: Married    Spouse name: Not on file   Number of children: 3   Years of education: Not on file   Highest education level: Bachelor's degree (e.g., BA, AB, BS)  Occupational History    Comment: retired  Tobacco Use   Smoking status: Former    Current packs/day: 0.00    Average packs/day: 1 pack/day for 30.0 years (30.0 ttl pk-yrs)    Types: Cigarettes    Start date: 08/22/1977    Quit date: 08/23/2007    Years since quitting: 15.8    Passive exposure: Past   Smokeless tobacco: Never  Vaping Use   Vaping status: Never Used  Substance and Sexual Activity   Alcohol use: No   Drug use: No   Sexual activity: Not Currently    Birth control/protection: None  Other Topics Concern   Not on file  Social History Narrative   Not on file   Social Determinants of Health   Financial Resource Strain: Patient Declined (03/02/2023)   Overall Financial Resource Strain (CARDIA)    Difficulty of Paying Living Expenses: Patient declined  Food Insecurity: Patient Declined (03/02/2023)   Hunger Vital Sign    Worried About Running Out of Food in the Last Year: Patient declined    Ran Out of Food in the Last Year: Patient declined  Transportation Needs: Patient Declined (03/02/2023)   PRAPARE - Administrator, Civil Service (Medical): Patient declined    Lack of Transportation (Non-Medical): Patient declined  Physical Activity: Patient Declined (03/02/2023)   Exercise Vital Sign    Days of Exercise per Week: Patient declined    Minutes of Exercise per Session: Patient declined  Stress: Patient Declined (03/02/2023)   [employer] of Occupational Health - Occupational Stress Questionnaire    Feeling of Stress : Patient declined  Social Connections: Unknown (03/02/2023)   Social Connection and Isolation Panel [NHANES]    Frequency of Communication with Friends and Family: Patient declined    Frequency of Social Gatherings with Friends and Family: Patient declined    Attends  Religious Services: Not on Insurance claims handler of Clubs or Organizations: Patient declined    Attends Banker Meetings: Patient declined    Marital Status: Patient declined  Intimate Partner Violence: Not At Risk (03/03/2023)   Humiliation, Afraid, Rape, and Kick questionnaire    Fear of Current or Ex-Partner: No    Emotionally Abused: No    Physically Abused: No    Sexually Abused: No      Family History  Problem Relation Age of Onset   Psoriasis Mother    Heart failure Father     Vitals:   06/11/23 1054  BP: (!) 140/72  Pulse: 72  SpO2: 99%  Weight: 229 lb (103.9 kg)  Height: 5\' 10"  (1.778 m)    PHYSICAL EXAM: General:  Well appearing. No respiratory difficulty HEENT: normal Neck: supple. no JVD. Carotids 2+ bilat; no bruits. No lymphadenopathy or thryomegaly appreciated. Cor: PMI nondisplaced. Regular rate & rhythm. No rubs, gallops or murmurs. Lungs: clear Abdomen: obese  soft, nontender, nondistended. No hepatosplenomegaly. No bruits or masses. Good bowel sounds. Extremities: no cyanosis, clubbing, rash, edema  LUE AVF Neuro: alert & oriented x 3, cranial nerves grossly intact. moves all 4 extremities w/o difficulty. Affect pleasant.  ECG: NSR 75 1AVB ( ). Normal volts. Nonspecific ST abnormalities Personally reviewed    ASSESSMENT & PLAN:  1. Abnormal cardiac MRI - Echo 10/24 EF 55 to 60%, grade 2 diastolic dysfunction mild to moderate LVH Severely dilated left atrium, mild MR - cMRI 10/24 EF 57%, diffuse mid wall basal LGE w/ apical sparing. ECV not calculated d/t patient requesting test to stop Normal RV  - cMRI and echo images reviewed personally. Findings are non-specific and may just be related to longstanding HTN but need to rule out potential infiltrative processes - Check SPEP, serum FLC, PYP and genetic testing for TTR  2. CAD - no s/s angina - management per Dr. Mariah Milling  3. HTN  - mildly elevated here. But he reports well  controlled at home  4. ESRD - on home HD  Arvilla Meres, MD  2:31 PM

## 2023-06-11 NOTE — Patient Instructions (Addendum)
   Lab Work:  Labs done today, your results will be available in MyChart, we will contact you for abnormal readings.   Genetic test has been done, this has to be sent to [place] to be processed and can take 1-2 weeks to get results back.  We will let you know the results.   Testing/Procedures:  You have been ordered a PYP Scan.  This is done at [facility], they will call you to schedule.  When you come for this test please plan to be there 2-3 hours.  They are located at: [address] [place], [place] [address] Call this number to reschedule [phone]    Special Instructions // Education:  Do the following things EVERYDAY: Weigh yourself in the morning before breakfast. Write it down and keep it in a log. Take your medicines as prescribed Eat low salt foods--Limit salt (sodium) to 2000 mg per day.  Stay as active as you can everyday Limit all fluids for the day to less than 2 liters   Follow-Up in: please call once your PYP scan has been completed to schedule your follow up.    If you have any questions or concerns before your next appointment please send [place] a message through [place] or call our office at [phone] Monday-Friday 8 am-5 pm.   If you have an urgent need after hours on the weekend please call your Primary Cardiologist or the [facility] in [place] at [phone].   At the [facility], you and your health needs are our priority. We have a designated team specialized in the treatment of Heart Failure. This Care Team includes your primary Heart Failure Specialized Cardiologist (physician), Advanced Practice Providers (APPs- Physician Assistants and Nurse Practitioners), and Pharmacist who all work together to provide you with the care you need, when you need it.   You may see any of the following providers on your designated Care Team at your next follow up:  Dr. Arvilla Meres Dr. Marca Ancona Dr. Dorthula Nettles Dr. Theresia Bough Tonye Becket, NP Robbie Lis, [place] [address] [place], [place] Brynda Peon, NP [place] Lee, NP Clarisa Kindred, NP Enos Fling, PharmD

## 2023-06-14 DIAGNOSIS — Z992 Dependence on renal dialysis: Secondary | ICD-10-CM | POA: Diagnosis not present

## 2023-06-14 DIAGNOSIS — N2581 Secondary hyperparathyroidism of renal origin: Secondary | ICD-10-CM | POA: Diagnosis not present

## 2023-06-14 DIAGNOSIS — N186 End stage renal disease: Secondary | ICD-10-CM | POA: Diagnosis not present

## 2023-06-16 DIAGNOSIS — N186 End stage renal disease: Secondary | ICD-10-CM | POA: Diagnosis not present

## 2023-06-16 DIAGNOSIS — N2581 Secondary hyperparathyroidism of renal origin: Secondary | ICD-10-CM | POA: Diagnosis not present

## 2023-06-16 DIAGNOSIS — Z992 Dependence on renal dialysis: Secondary | ICD-10-CM | POA: Diagnosis not present

## 2023-06-17 LAB — MULTIPLE MYELOMA PANEL, SERUM
Albumin SerPl Elph-Mcnc: 4 g/dL (ref 2.9–4.4)
Albumin/Glob SerPl: 1.7 (ref 0.7–1.7)
Alpha 1: 0.1 g/dL (ref 0.0–0.4)
Alpha2 Glob SerPl Elph-Mcnc: 0.8 g/dL (ref 0.4–1.0)
B-Globulin SerPl Elph-Mcnc: 0.7 g/dL (ref 0.7–1.3)
Gamma Glob SerPl Elph-Mcnc: 0.8 g/dL (ref 0.4–1.8)
Globulin, Total: 2.4 g/dL (ref 2.2–3.9)
IgA/Immunoglobulin A, Serum: 150 mg/dL (ref 61–437)
IgG (Immunoglobin G), Serum: 873 mg/dL (ref 603–1613)
IgM (Immunoglobulin M), Srm: 87 mg/dL (ref 15–143)
Total Protein: 6.4 g/dL (ref 6.0–8.5)

## 2023-06-17 LAB — MULTIPLE MYELOMA CASCADE WITH REFLEX TO SIFE AND SFLC

## 2023-06-18 DIAGNOSIS — N186 End stage renal disease: Secondary | ICD-10-CM | POA: Diagnosis not present

## 2023-06-18 DIAGNOSIS — Z992 Dependence on renal dialysis: Secondary | ICD-10-CM | POA: Diagnosis not present

## 2023-06-18 DIAGNOSIS — N2581 Secondary hyperparathyroidism of renal origin: Secondary | ICD-10-CM | POA: Diagnosis not present

## 2023-06-21 ENCOUNTER — Encounter: Payer: Self-pay | Admitting: Internal Medicine

## 2023-06-21 ENCOUNTER — Encounter (HOSPITAL_COMMUNITY): Payer: Self-pay | Admitting: Emergency Medicine

## 2023-06-21 ENCOUNTER — Emergency Department (HOSPITAL_BASED_OUTPATIENT_CLINIC_OR_DEPARTMENT_OTHER): Payer: Medicare HMO

## 2023-06-21 ENCOUNTER — Other Ambulatory Visit: Payer: Self-pay

## 2023-06-21 ENCOUNTER — Observation Stay (HOSPITAL_COMMUNITY)
Admission: EM | Admit: 2023-06-21 | Discharge: 2023-06-23 | Disposition: A | Discharge status: Other Acute Inpatient Hospital | Payer: Medicare HMO | Attending: Internal Medicine | Admitting: Internal Medicine

## 2023-06-21 DIAGNOSIS — D631 Anemia in chronic kidney disease: Secondary | ICD-10-CM | POA: Insufficient documentation

## 2023-06-21 DIAGNOSIS — Z955 Presence of coronary angioplasty implant and graft: Secondary | ICD-10-CM | POA: Diagnosis not present

## 2023-06-21 DIAGNOSIS — Z8679 Personal history of other diseases of the circulatory system: Secondary | ICD-10-CM

## 2023-06-21 DIAGNOSIS — Z8673 Personal history of transient ischemic attack (TIA), and cerebral infarction without residual deficits: Secondary | ICD-10-CM | POA: Diagnosis not present

## 2023-06-21 DIAGNOSIS — N186 End stage renal disease: Secondary | ICD-10-CM

## 2023-06-21 DIAGNOSIS — E1121 Type 2 diabetes mellitus with diabetic nephropathy: Secondary | ICD-10-CM | POA: Diagnosis not present

## 2023-06-21 DIAGNOSIS — T82510A Breakdown (mechanical) of surgically created arteriovenous fistula, initial encounter: Principal | ICD-10-CM | POA: Insufficient documentation

## 2023-06-21 DIAGNOSIS — Z87891 Personal history of nicotine dependence: Secondary | ICD-10-CM | POA: Insufficient documentation

## 2023-06-21 DIAGNOSIS — E785 Hyperlipidemia, unspecified: Secondary | ICD-10-CM | POA: Diagnosis not present

## 2023-06-21 DIAGNOSIS — Z79899 Other long term (current) drug therapy: Secondary | ICD-10-CM | POA: Insufficient documentation

## 2023-06-21 DIAGNOSIS — E1122 Type 2 diabetes mellitus with diabetic chronic kidney disease: Secondary | ICD-10-CM | POA: Diagnosis not present

## 2023-06-21 DIAGNOSIS — T829XXA Unspecified complication of cardiac and vascular prosthetic device, implant and graft, initial encounter: Principal | ICD-10-CM

## 2023-06-21 DIAGNOSIS — Z992 Dependence on renal dialysis: Secondary | ICD-10-CM

## 2023-06-21 DIAGNOSIS — Z7902 Long term (current) use of antithrombotics/antiplatelets: Secondary | ICD-10-CM | POA: Insufficient documentation

## 2023-06-21 DIAGNOSIS — N19 Unspecified kidney failure: Secondary | ICD-10-CM

## 2023-06-21 DIAGNOSIS — T82898A Other specified complication of vascular prosthetic devices, implants and grafts, initial encounter: Secondary | ICD-10-CM | POA: Diagnosis present

## 2023-06-21 DIAGNOSIS — Y841 Kidney dialysis as the cause of abnormal reaction of the patient, or of later complication, without mention of misadventure at the time of the procedure: Secondary | ICD-10-CM | POA: Diagnosis not present

## 2023-06-21 DIAGNOSIS — I10 Essential (primary) hypertension: Secondary | ICD-10-CM | POA: Diagnosis not present

## 2023-06-21 DIAGNOSIS — I2510 Atherosclerotic heart disease of native coronary artery without angina pectoris: Secondary | ICD-10-CM | POA: Insufficient documentation

## 2023-06-21 DIAGNOSIS — Z7982 Long term (current) use of aspirin: Secondary | ICD-10-CM | POA: Diagnosis not present

## 2023-06-21 DIAGNOSIS — I120 Hypertensive chronic kidney disease with stage 5 chronic kidney disease or end stage renal disease: Secondary | ICD-10-CM | POA: Diagnosis not present

## 2023-06-21 LAB — CBC WITH DIFFERENTIAL/PLATELET
Abs Immature Granulocytes: 0.02 10*3/uL (ref 0.00–0.07)
Basophils Absolute: 0 10*3/uL (ref 0.0–0.1)
Basophils Relative: 1 %
Eosinophils Absolute: 0.3 10*3/uL (ref 0.0–0.5)
Eosinophils Relative: 5 %
HCT: 29.4 % — ABNORMAL LOW (ref 39.0–52.0)
Hemoglobin: 9.6 g/dL — ABNORMAL LOW (ref 13.0–17.0)
Immature Granulocytes: 0 %
Lymphocytes Relative: 23 %
Lymphs Abs: 1.4 10*3/uL (ref 0.7–4.0)
MCH: 32.9 pg (ref 26.0–34.0)
MCHC: 32.7 g/dL (ref 30.0–36.0)
MCV: 100.7 fL — ABNORMAL HIGH (ref 80.0–100.0)
Monocytes Absolute: 0.5 10*3/uL (ref 0.1–1.0)
Monocytes Relative: 9 %
Neutro Abs: 3.8 10*3/uL (ref 1.7–7.7)
Neutrophils Relative %: 62 %
Platelets: 140 10*3/uL — ABNORMAL LOW (ref 150–400)
RBC: 2.92 MIL/uL — ABNORMAL LOW (ref 4.22–5.81)
RDW: 12.6 % (ref 11.5–15.5)
WBC: 6 10*3/uL (ref 4.0–10.5)
nRBC: 0 % (ref 0.0–0.2)

## 2023-06-21 LAB — CBC
HCT: 31.9 % — ABNORMAL LOW (ref 39.0–52.0)
Hemoglobin: 10.5 g/dL — ABNORMAL LOW (ref 13.0–17.0)
MCH: 31.8 pg (ref 26.0–34.0)
MCHC: 32.9 g/dL (ref 30.0–36.0)
MCV: 96.7 fL (ref 80.0–100.0)
Platelets: 150 10*3/uL (ref 150–400)
RBC: 3.3 MIL/uL — ABNORMAL LOW (ref 4.22–5.81)
RDW: 12.7 % (ref 11.5–15.5)
WBC: 7.2 10*3/uL (ref 4.0–10.5)
nRBC: 0 % (ref 0.0–0.2)

## 2023-06-21 LAB — BASIC METABOLIC PANEL
Anion gap: 13 (ref 5–15)
BUN: 109 mg/dL — ABNORMAL HIGH (ref 8–23)
CO2: 16 mmol/L — ABNORMAL LOW (ref 22–32)
Calcium: 9.5 mg/dL (ref 8.9–10.3)
Chloride: 111 mmol/L (ref 98–111)
Creatinine, Ser: 12.23 mg/dL — ABNORMAL HIGH (ref 0.61–1.24)
GFR, Estimated: 4 mL/min — ABNORMAL LOW (ref >60)
Glucose, Bld: 190 mg/dL — ABNORMAL HIGH (ref 70–99)
Potassium: 4.8 mmol/L (ref 3.5–5.1)
Sodium: 140 mmol/L (ref 135–145)

## 2023-06-21 LAB — I-STAT CHEM 8, ED
BUN: 119 mg/dL — ABNORMAL HIGH (ref 8–23)
Calcium, Ion: 1.21 mmol/L (ref 1.15–1.40)
Chloride: 110 mmol/L (ref 98–111)
Creatinine, Ser: 12.8 mg/dL — ABNORMAL HIGH (ref 0.61–1.24)
Glucose, Bld: 183 mg/dL — ABNORMAL HIGH (ref 70–99)
HCT: 27 % — ABNORMAL LOW (ref 39.0–52.0)
Hemoglobin: 9.2 g/dL — ABNORMAL LOW (ref 13.0–17.0)
Potassium: 4.8 mmol/L (ref 3.5–5.1)
Sodium: 140 mmol/L (ref 135–145)
TCO2: 16 mmol/L — ABNORMAL LOW (ref 22–32)

## 2023-06-21 LAB — GLUCOSE, CAPILLARY: Glucose-Capillary: 180 mg/dL — ABNORMAL HIGH (ref 70–99)

## 2023-06-21 LAB — CREATININE, SERUM
Creatinine, Ser: 12.17 mg/dL — ABNORMAL HIGH (ref 0.61–1.24)
GFR, Estimated: 4 mL/min — ABNORMAL LOW (ref >60)

## 2023-06-21 LAB — HEPATITIS B SURFACE ANTIGEN: Hepatitis B Surface Ag: NONREACTIVE

## 2023-06-21 MED ORDER — HEPARIN SODIUM (PORCINE) 5000 UNIT/ML IJ SOLN: 5000.0000 [IU] | Freq: Three times a day (TID) | INTRAMUSCULAR | Status: DC

## 2023-06-21 MED ORDER — SEVELAMER CARBONATE 800 MG PO TABS
800.0000 mg | ORAL_TABLET | Freq: Three times a day (TID) | ORAL | Status: DC
  Administered 2023-06-22 – 2023-06-23 (×2): 800 mg via ORAL
  Filled 2023-06-21 (×2): qty 1

## 2023-06-21 MED ORDER — CARVEDILOL 12.5 MG PO TABS
12.5000 mg | ORAL_TABLET | Freq: Two times a day (BID) | ORAL | Status: DC
  Administered 2023-06-21 – 2023-06-23 (×3): 12.5 mg via ORAL
  Filled 2023-06-21 (×3): qty 1

## 2023-06-21 MED ORDER — ONDANSETRON HCL 4 MG PO TABS
4.0000 mg | ORAL_TABLET | Freq: Four times a day (QID) | ORAL | Status: DC | PRN
  Administered 2023-06-23: 4 mg via ORAL
  Filled 2023-06-21: qty 1

## 2023-06-21 MED ORDER — ACETAMINOPHEN 650 MG RE SUPP: 650.0000 mg | Freq: Four times a day (QID) | RECTAL | Status: DC | PRN

## 2023-06-21 MED ORDER — ONDANSETRON HCL 4 MG/2ML IJ SOLN
4.0000 mg | Freq: Four times a day (QID) | INTRAMUSCULAR | Status: DC | PRN
  Filled 2023-06-21: qty 2

## 2023-06-21 MED ORDER — POLYETHYLENE GLYCOL 3350 17 G PO PACK: 17.0000 g | PACK | Freq: Every day | ORAL | Status: DC | PRN

## 2023-06-21 MED ORDER — ATORVASTATIN CALCIUM 80 MG PO TABS
80.0000 mg | ORAL_TABLET | Freq: Every day | ORAL | Status: DC
  Administered 2023-06-22 – 2023-06-23 (×2): 80 mg via ORAL
  Filled 2023-06-21 (×2): qty 1

## 2023-06-21 MED ORDER — CINACALCET HCL 30 MG PO TABS
30.0000 mg | ORAL_TABLET | ORAL | Status: DC
  Administered 2023-06-23: 30 mg via ORAL
  Filled 2023-06-21: qty 1

## 2023-06-21 MED ORDER — HEPARIN (PORCINE) 25000 UT/250ML-% IV SOLN
1900.0000 [IU]/h | INTRAVENOUS | Status: DC
  Administered 2023-06-21: 1500 [IU]/h via INTRAVENOUS
  Administered 2023-06-23: 1900 [IU]/h via INTRAVENOUS
  Filled 2023-06-21 (×3): qty 250

## 2023-06-21 MED ORDER — CHLORHEXIDINE GLUCONATE CLOTH 2 % EX PADS
6.0000 | MEDICATED_PAD | Freq: Every day | CUTANEOUS | Status: DC
  Administered 2023-06-23: 6 via TOPICAL

## 2023-06-21 MED ORDER — RENA-VITE PO TABS
1.0000 | ORAL_TABLET | Freq: Every day | ORAL | Status: DC
  Administered 2023-06-22 – 2023-06-23 (×2): 1 via ORAL
  Filled 2023-06-21 (×2): qty 1

## 2023-06-21 MED ORDER — TORSEMIDE 20 MG PO TABS
100.0000 mg | ORAL_TABLET | Freq: Every morning | ORAL | Status: DC
  Administered 2023-06-22 – 2023-06-23 (×2): 100 mg via ORAL
  Filled 2023-06-21 (×2): qty 5

## 2023-06-21 MED ORDER — HEPARIN BOLUS VIA INFUSION
5500.0000 [IU] | Freq: Once | INTRAVENOUS | Status: AC
  Administered 2023-06-21: 5500 [IU] via INTRAVENOUS
  Filled 2023-06-21: qty 5500

## 2023-06-21 MED ORDER — ACETAMINOPHEN 325 MG PO TABS
650.0000 mg | ORAL_TABLET | Freq: Four times a day (QID) | ORAL | Status: DC | PRN
  Administered 2023-06-23: 650 mg via ORAL
  Filled 2023-06-21: qty 2

## 2023-06-21 MED ORDER — CALCITRIOL 0.25 MCG PO CAPS
0.2500 ug | ORAL_CAPSULE | Freq: Every morning | ORAL | Status: DC
  Administered 2023-06-22 – 2023-06-23 (×2): 0.25 ug via ORAL
  Filled 2023-06-21 (×2): qty 1

## 2023-06-21 MED ORDER — AMLODIPINE BESYLATE 5 MG PO TABS
5.0000 mg | ORAL_TABLET | Freq: Every morning | ORAL | Status: DC
  Administered 2023-06-23: 5 mg via ORAL
  Filled 2023-06-21: qty 1

## 2023-06-21 NOTE — Assessment & Plan Note (Signed)
On home hemodialysis, due Monday, Wednesday and Friday.  Incomplete last 3 sessions due to AV fistula malfunctioning. Elevated BUN and creatinine.  No nausea, vomiting or change in sensorium.  Potassium normal Nephrology is on board.

## 2023-06-21 NOTE — H&P (Signed)
History and Physical    Patient: Jeremy Dorsey:[id number] DOB: 1951-08-01 DOA: 06/21/2023 DOS: the patient was seen and examined on 06/21/2023 PCP: Ronnald Ramp, MD  Patient coming from: Home  Chief Complaint:  Chief Complaint  Patient presents with   Vascular Access Problem   HPI: Jeremy Dorsey is a 72 y.o. male with medical history significant of ESRD on home dialysis, MWF, CAD s/p PCI in 2021, hypertension and diet-controlled diabetes came to ED with concern of malfunctioning fistula.  Patient was having difficulty and unable to complete dialysis over the last week due to malfunctioning fistula.  Denies any nausea, vomiting, jitteriness or change in sensorium.  On arrival he was hemodynamically stable, labs with CO2 of 16, BUN 109, creatinine 12.23, hemoglobin 9.6, platelet 140  Nephrology and vascular surgery was consulted.  Vascular surgery tried bedside ultrasound and for concern of thrombosis of AV fistula IR was consulted and they will try declotting tomorrow morning.  Patient was placed on heparin infusion.  If unsuccessful, vascular will placed a new temporary catheter for dialysis.  Review of Systems: As mentioned in the history of present illness. All other systems reviewed and are negative. Past Medical History:  Diagnosis Date   Anemia    Cancer (HCC)    basal cell nose and forehead   Cataract 2016   Cataract surgery bith eyes   CKD (chronic kidney disease), stage IV (HCC)    Coronary artery disease    a. s/p IVUS-guided DESx2 to prox & mid LAD, residual disease treated medically. EF 50-55% by recent echo 02/2020.   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   Urinary complication    Past Surgical History:  Procedure Laterality Date   ABDOMINAL AORTOGRAM W/LOWER EXTREMITY N/A  01/29/2022   Procedure: ABDOMINAL AORTOGRAM W/ Bilateral LOWER EXTREMITY [place];  Surgeon: Cephus Shelling, MD;  [facility]: [facility];  Service: Cardiovascular;  Laterality: N/A;   ABDOMINAL AORTOGRAM W/LOWER EXTREMITY N/A 06/11/2022   Procedure: ABDOMINAL AORTOGRAM W/LOWER EXTREMITY;  Surgeon: Cephus Shelling, MD;  Location: [facility];  Service: Cardiovascular;  Laterality: N/A;   AV FISTULA PLACEMENT Left 10/31/2020   Procedure: LEFT UPPER EXTREMITY ARTERIOVENOUS (AV) FISTULA CREATION;  Surgeon: Maeola Harman, MD;  [facility];  Service: Vascular;  Laterality: Left;   AV FISTULA PLACEMENT Left 07/09/2021   Procedure: LEFT ARM ARTERIOVENOUS (AV) FISTULA CREATION;  Surgeon: Nada Libman, MD;  Location: [facility] OR;  Service: Vascular;  Laterality: Left;   COLONOSCOPY  1990   COLONOSCOPY  06/09/2011   Dr Lemar Livings   CORONARY STENT INTERVENTION N/A 04/24/2020   Procedure: CORONARY STENT INTERVENTION;  Surgeon: Iran Ouch, MD;  [facility]: [facility];  Service: Cardiovascular;  Laterality: N/A;   CORONARY ULTRASOUND/IVUS N/A 04/24/2020   Procedure: Intravascular Ultrasound/IVUS;  Surgeon: Iran Ouch, MD;  [facility]: [facility];  Service: Cardiovascular;  Laterality: N/A;   CYSTOSCOPY W/ RETROGRADES Bilateral 08/01/2018   Procedure: CYSTOSCOPY WITH RETROGRADE PYELOGRAM;  Surgeon: Sondra Come, MD;  Location: [facility] ORS;  Service: Urology;  Laterality: Bilateral;   CYSTOSCOPY WITH BIOPSY N/A 08/01/2018   Procedure: CYSTOSCOPY WITH Bladder BIOPSY;  Surgeon: Sondra Come, MD;  Location: [facility] ORS;  Service: Urology;  Laterality: N/A;   CYSTOSCOPY WITH STENT PLACEMENT Right 08/01/2018   Procedure: CYSTOSCOPY WITH STENT PLACEMENT;  Surgeon: Sondra Come, MD;  Location: [facility] ORS;  Service: Urology;  Laterality: Right;   EYE SURGERY Right    laser surgery   FRACTURE SURGERY     Rt shoulder rotaor cuff   LEFT HEART CATH AND  CORONARY ANGIOGRAPHY N/A 04/24/2020   Procedure: LEFT HEART CATH AND CORONARY ANGIOGRAPHY;  Surgeon: Iran Ouch, MD;  [facility];  Service: Cardiovascular;  Laterality: N/A;   LEFT HEART CATH AND CORONARY ANGIOGRAPHY Left 04/16/2023   Procedure: LEFT HEART CATH AND CORONARY ANGIOGRAPHY;  Surgeon: Iran Ouch, MD;  Location: [facility];  Service: Cardiovascular;  Laterality: Left;   PERIPHERAL VASCULAR BALLOON ANGIOPLASTY Right 01/29/2022   Procedure: PERIPHERAL VASCULAR BALLOON ANGIOPLASTY;  Surgeon: Cephus Shelling, MD;  [facility];  Service: Cardiovascular;  Laterality: Right;   PERIPHERAL VASCULAR BALLOON ANGIOPLASTY Right 06/11/2022   Procedure: PERIPHERAL VASCULAR BALLOON ANGIOPLASTY;  Surgeon: Cephus Shelling, MD;  [facility];  Service: Cardiovascular;  Laterality: Right;  Peroneal   SHOULDER ARTHROSCOPY WITH OPEN ROTATOR CUFF REPAIR Right 08/25/2018   Procedure: SHOULDER ARTHROSCOPY WITH MINI OPEN ROTATOR CUFF REPAIR;  Surgeon: Juanell Fairly, MD;  Location: [facility] OR[facility];  Service: Orthopedics;  Laterality: Right;   TRANSURETHRAL RESECTION OF BLADDER TUMOR N/A 08/01/2018   Procedure: TRANSURETHRAL RESECTION OF BLADDER TUMOR (TURBT);  Surgeon: Sondra Come, MD;  Location: [facility] OR[facility];  Service: Urology;  Laterality: N/A;   URETEROSCOPY WITH HOLMIUM LASER LITHOTRIPSY Right 08/01/2018   Procedure: URETEROSCOPY WITH HOLMIUM LASER LITHOTRIPSY;  Surgeon: Sondra Come, MD;  Location: [facility] OR[facility];  Service: Urology;  Laterality: Right;   Social History:  reports that he quit smoking about 15 years ago. His smoking use included cigarettes. He started smoking about 45 years ago. He has a 30 pack-year smoking history. He has been exposed to tobacco smoke. He has never used smokeless tobacco. He reports that he does not drink alcohol and does not use drugs.  Allergies  Allergen Reactions   Oxycodone Nausea And  Vomiting   Hydrocodone Nausea And Vomiting    Family History  Problem Relation Age of Onset   Psoriasis Mother    Heart failure Father     Prior to Admission medications   Medication Sig Start Date End Date Taking? Authorizing Provider  acetaminophen (TYLENOL) 500 MG tablet Take 1,500-2,000 mg by mouth every 6 (six) hours as needed for mild pain (pain score 1-3) or moderate pain (pain score 4-6).   Yes [provider]  amLODipine (NORVASC) 5 MG tablet Take 5 mg by mouth in the morning.   Yes [provider]  aspirin EC (ASPIRIN LOW DOSE) 81 MG tablet TAKE 1 TABLET BY MOUTH EVERY MORNING (SWALLOW WHOLE) Patient taking differently: Take 81 mg by mouth daily. 04/13/23  Yes Antonieta Iba, MD  atorvastatin (LIPITOR) 80 MG tablet TAKE 1 TABLET BY MOUTH DAILY 04/13/23  Yes Gollan, Tollie Pizza, MD  AURYXIA 1 GM 210 MG(Fe) tablet Take by mouth. 04/22/22  Yes [provider]  calcitRIOL (ROCALTROL) 0.25 MCG capsule Take 0.25 mcg by mouth in the morning.   Yes [provider]  carvedilol (COREG) 12.5 MG tablet Take 1 tablet by mouth 2 (two) times daily. 08/24/22  Yes [provider]  cinacalcet (SENSIPAR) 30 MG tablet Take 30 mg by mouth every Monday, Wednesday, and Friday.  Yes [provider]  clopidogrel (PLAVIX) 75 MG tablet TAKE 1 TABLET BY MOUTH DAILY 04/13/23  Yes Antonieta Iba, MD  lidocaine-prilocaine (EMLA) cream Apply 1 Application topically every Monday, Wednesday, and Friday. 01/07/22  Yes [provider]  Methoxy PEG-Epoetin Beta (MIRCERA IJ) Inject 1 each into the skin See admin instructions. During Dialysis 04/26/23  Yes [provider]  multivitamin (RENA-VIT) TABS tablet Take 1 tablet by mouth daily.   Yes [provider]  mupirocin cream (BACTROBAN) 2 % Apply 1 Application topically daily. 03/25/23  Yes [provider]  nitroGLYCERIN (NITROSTAT) 0.4 MG SL tablet Place 1 tablet (0.4 mg total)  under the tongue every 5 (five) minutes x 3 doses as needed for chest pain. 03/15/23  Yes Antonieta Iba, MD  sevelamer carbonate (RENVELA) 800 MG tablet Take 800 mg by mouth with breakfast, with lunch, and with evening meal. 12/22/21  Yes [provider]  sildenafil (VIAGRA) 100 MG tablet Take 100 mg by mouth as needed for erectile dysfunction. 03/30/22  Yes [provider]  torsemide (DEMADEX) 100 MG tablet Take 100 mg by mouth every morning. 01/20/22  Yes [provider]    Physical Exam: Vitals:   06/21/23 1534 06/21/23 1600 06/21/23 1623 06/21/23 1900  BP:   (!) 157/83 (!) 167/107  Pulse:   71 70  Resp:   17 18  Temp: 97.9 F (36.6 C)   98.6 F (37 C)  SpO2:   98% 100%  Weight:  103.9 kg    Height:  5\' 10"  (1.778 m)      General: Vital signs reviewed.  Patient is well-developed and well-nourished, in no acute distress and cooperative with exam.  Head: Normocephalic and atraumatic. Eyes: EOMI, conjunctivae normal, no scleral icterus.  Neck: Supple, trachea midline, normal ROM, no JVD, masses, thyromegaly, or carotid bruit present.  Cardiovascular: RRR, S1 normal, S2 normal, no murmurs, gallops, or rubs. Pulmonary/Chest: Clear to auscultation bilaterally, no wheezes, rales, or rhonchi. Abdominal: Soft, non-tender, non-distended, BS +,  Extremities: No lower extremity edema bilaterally,  pulses symmetric and intact bilaterally. No cyanosis or clubbing. Neurological: A&O x3, Strength is normal and symmetric bilaterally, cranial nerve II-XII are grossly intact, no focal motor deficit, sensory intact to light touch bilaterally.  Skin: Warm, dry and intact. No rashes or erythema. Psychiatric: Normal mood and affect.    Data Reviewed: Prior data reviewed  Assessment and Plan: * AV fistula occlusion [facility]) Nephrology and vascular surgery are on board, concern of thrombosis causing fistula malfunctioning so IR was consulted. Patient will be n.p.o. after  midnight and IR will try to declot tomorrow morning and if unsuccessful then vascular will placed a permanent HD catheter for continuation of dialysis. -Patient was started on heparin infusion  ESRD on dialysis [facility]) On home hemodialysis, due Monday, Wednesday and Friday.  Incomplete last 3 sessions due to AV fistula malfunctioning. Elevated BUN and creatinine.  No nausea, vomiting or change in sensorium.  Potassium normal Nephrology is on board.  Essential hypertension -Continue home amlodipine and Coreg.  Diabetes mellitus with nephropathy (HCC) Patient was not on any medications at home. -Monitor CBG -Will add SSI if needed  History of CAD (coronary artery disease) S/p PCI with 2 stents placed in 2021. No chest pain. -Holding home aspirin and Plavix for the procedure  Hyperlipidemia LDL goal <70 -Continue home Lipitor    Advance Care Planning:   Code Status: Full Code discussed with husband and wife  Consults: Nephrology,  vascular surgery, IR  Family Communication: Discussed with wife at bedside  Severity of Illness: The appropriate patient status for this patient is OBSERVATION. Observation status is judged to be reasonable and necessary in order to provide the required intensity of service to ensure the patient's safety. The patient's presenting symptoms, physical exam findings, and initial radiographic and laboratory data in the context of their medical condition is felt to place them at decreased risk for further clinical deterioration. Furthermore, it is anticipated that the patient will be medically stable for discharge from the hospital within 2 midnights of admission.   This record has been created using Conservation officer, historic buildings. Errors have been sought and corrected,but may not always be located. Such creation errors do not reflect on the standard of care.   Author: Arnetha Courser, MD 06/21/2023 7:22 PM  For on call review [email or web address].

## 2023-06-21 NOTE — ED Provider Notes (Signed)
Care of patient received from prior provider at 4:12 PM, please see their note for complete H/P and care plan.  Received handoff per ED course.  Clinical Course as of 06/21/23 1612  Mon Jun 21, 2023  1520 Stable 66 YOM with a chief complaint of Vasc. Acc. Prob. Vascular to see. Formal Duplex pending. No thrill.  [CC]    Clinical Course User Index [CC] Glyn Ade, MD   CRITICAL CARE Performed by: Glyn Ade   Total critical care time: 45 minutes  Critical care time was exclusive of separately billable procedures and treating other patients.  Critical care was necessary to treat or prevent imminent or life-threatening deterioration.  Critical care was time spent personally by me on the following activities: development of treatment plan with patient and/or surrogate as well as nursing, discussions with consultants, evaluation of patient's response to treatment, examination of patient, obtaining history from patient or surrogate, ordering and performing treatments and interventions, ordering and review of laboratory studies, ordering and review of radiographic studies, pulse oximetry and re-evaluation of patient's condition.  Reassessment: Discussed case with vascular surgery.  They recommended heparinization, stabilization overnight and IR intervention jointly with vascular surgery consult. Consulted interventional radiology who agreed with plan. Consulted hospitalist for admission.  Disposition:   Based on the above findings, I believe this patient is stable for admission.    Patient/family educated about specific findings on our evaluation and explained exact reasons for admission.  Patient/family educated about clinical situation and time was allowed to answer questions.   Admission team communicated with and agreed with need for admission. Patient admitted. Patient  ready to move at this time.     Emergency Department Medication Summary:   Medications - No data to  display          Glyn Ade, MD 06/21/23 2325

## 2023-06-21 NOTE — Assessment & Plan Note (Signed)
Patient was not on any medications at home. -Monitor CBG -Will add SSI if needed

## 2023-06-21 NOTE — Progress Notes (Signed)
PHARMACY - ANTICOAGULATION CONSULT NOTE  Pharmacy Consult for heparin infusion Indication:  clots in AV fistula  Allergies  Allergen Reactions   Oxycodone Nausea And Vomiting   Hydrocodone Nausea And Vomiting    Patient Measurements: Height: 5\' 10"  (177.8 cm) Weight: 103.9 kg (229 lb 0.9 oz) IBW/kg (Calculated) : 73 Heparin Dosing Weight: 95 kg  Vital Signs: Temp: 97.9 F (36.6 C) (11/18 1534) BP: 176/86 (11/18 1430) Pulse Rate: 71 (11/18 1430)  Labs: Recent Labs    06/21/23 1352 06/21/23 1400  HGB 9.6* 9.2*  HCT 29.4* 27.0*  PLT 140*  --   CREATININE 12.23* 12.80*    Estimated Creatinine Clearance: 6.2 mL/min (A) (by C-G formula based on SCr of 12.8 mg/dL (H)).   Medical History: Past Medical History:  Diagnosis Date   Anemia    Cancer (HCC)    basal cell nose and forehead   Cataract 2016   Cataract surgery bith eyes   CKD (chronic kidney disease), stage IV (HCC)    Coronary artery disease    a. s/p IVUS-guided DESx2 to prox & mid LAD, residual disease treated medically. EF 50-55% by recent echo 02/2020.   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 Currently anemic with Hgb 9.2, no bleeding noted on provider exams. Heparin gtt recommended by vascular to prevent thrombus propagation.  Goal of Therapy:  Heparin level 0.3-0.7 units/ml Monitor platelets by anticoagulation protocol: Yes   Plan:  Give 5500 units bolus x 1 Start heparin infusion at 1500 units/hr Check anti-Xa level in 8 hours and daily while on heparin Continue to monitor H&H and platelets  Rutherford Nail, PharmD PGY2 Critical Care Pharmacy Resident 06/21/2023,4:24 PM

## 2023-06-21 NOTE — ED Triage Notes (Signed)
Pt reports he was sent by dialysis due to fistula being clogged. Pt reports he has been unable to complete a full dialysis tx in 1 week.  Denies SHOB.

## 2023-06-21 NOTE — ED Notes (Signed)
ED TO INPATIENT HANDOFF REPORT  ED Nurse Name and Phone #: [phone]  S Name/Age/Gender Jeremy Dorsey 72 y.o. male Room/Bed: 033C/033C  Code Status   Code Status: Full Code  Home/SNF/Other Home Patient oriented to: self, place, time, and situation Is this baseline? Yes   Triage Complete: Triage complete  Chief Complaint AV fistula occlusion [facility]) [T82.898A]  Triage Note Pt reports he was sent by dialysis due to fistula being clogged. Pt reports he has been unable to complete a full dialysis tx in 1 week.  Denies SHOB.    Allergies Allergies  Allergen Reactions   Oxycodone Nausea And Vomiting   Hydrocodone Nausea And Vomiting    Level of Care/Admitting Diagnosis ED Disposition     ED Disposition  Admit   Condition  --   Comment  Hospital Area: [facility] [[id number]]  Level of Care: Med-Surg [16]  May place patient in observation at [facility] or [facility] if equivalent level of care is available:: Yes  Covid Evaluation: Asymptomatic - no recent exposure (last 10 days) testing not required  Diagnosis: AV fistula occlusion [facility]) [[id number]]  Admitting Physician: Arnetha Courser [[id number]]  Attending Physician: Arnetha Courser [phone]          B Medical/Surgery History Past Medical History:  Diagnosis Date   Anemia    Cancer (HCC)    basal cell nose and forehead   Cataract 2016   Cataract surgery bith eyes   CKD (chronic kidney disease), stage IV (HCC)    Coronary artery disease    a. s/p IVUS-guided DESx2 to prox & mid LAD, residual disease treated medically. EF 50-55% by recent echo 02/2020.   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   Urinary complication    Past Surgical History:  Procedure Laterality Date   ABDOMINAL AORTOGRAM W/LOWER EXTREMITY N/A 01/29/2022   Procedure:  ABDOMINAL AORTOGRAM W/ Bilateral LOWER EXTREMITY [place];  Surgeon: Cephus Shelling, MD;  [facility]: [facility];  Service: Cardiovascular;  Laterality: N/A;   ABDOMINAL AORTOGRAM W/LOWER EXTREMITY N/A 06/11/2022   Procedure: ABDOMINAL AORTOGRAM W/LOWER EXTREMITY;  Surgeon: Cephus Shelling, MD;  Location: [facility];  Service: Cardiovascular;  Laterality: N/A;   AV FISTULA PLACEMENT Left 10/31/2020   Procedure: LEFT UPPER EXTREMITY ARTERIOVENOUS (AV) FISTULA CREATION;  Surgeon: Maeola Harman, MD;  Location: [facility] OR;  Service: Vascular;  Laterality: Left;   AV FISTULA PLACEMENT Left 07/09/2021   Procedure: LEFT ARM ARTERIOVENOUS (AV) FISTULA CREATION;  Surgeon: Nada Libman, MD;  Location: [facility] OR;  Service: Vascular;  Laterality: Left;   COLONOSCOPY  1990   COLONOSCOPY  06/09/2011   Dr Lemar Livings   CORONARY STENT INTERVENTION N/A 04/24/2020   Procedure: CORONARY STENT INTERVENTION;  Surgeon: Iran Ouch, MD;  [facility];  Service: Cardiovascular;  Laterality: N/A;   CORONARY ULTRASOUND/IVUS N/A 04/24/2020   Procedure: Intravascular Ultrasound/IVUS;  Surgeon: Iran Ouch, MD;  [facility];  Service: Cardiovascular;  Laterality: N/A;   CYSTOSCOPY W/ RETROGRADES Bilateral 08/01/2018   Procedure: CYSTOSCOPY WITH RETROGRADE PYELOGRAM;  Surgeon: Sondra Come, MD;  Location: [facility] ORS;  Service: Urology;  Laterality: Bilateral;   CYSTOSCOPY WITH BIOPSY N/A 08/01/2018   Procedure: CYSTOSCOPY WITH  Bladder BIOPSY;  Surgeon: Sondra Come, MD;  Location: [facility] ORS;  Service: Urology;  Laterality: N/A;   CYSTOSCOPY WITH STENT PLACEMENT Right 08/01/2018   Procedure: CYSTOSCOPY WITH STENT PLACEMENT;  Surgeon: Sondra Come, MD;  Location: [facility] ORS;  Service: Urology;  Laterality: Right;   EYE SURGERY Right    laser surgery   FRACTURE SURGERY     Rt shoulder rotaor cuff   LEFT HEART CATH AND CORONARY ANGIOGRAPHY N/A  04/24/2020   Procedure: LEFT HEART CATH AND CORONARY ANGIOGRAPHY;  Surgeon: Iran Ouch, MD;  [facility];  Service: Cardiovascular;  Laterality: N/A;   LEFT HEART CATH AND CORONARY ANGIOGRAPHY Left 04/16/2023   Procedure: LEFT HEART CATH AND CORONARY ANGIOGRAPHY;  Surgeon: Iran Ouch, MD;  Location: [facility];  Service: Cardiovascular;  Laterality: Left;   PERIPHERAL VASCULAR BALLOON ANGIOPLASTY Right 01/29/2022   Procedure: PERIPHERAL VASCULAR BALLOON ANGIOPLASTY;  Surgeon: Cephus Shelling, MD;  [facility];  Service: Cardiovascular;  Laterality: Right;   PERIPHERAL VASCULAR BALLOON ANGIOPLASTY Right 06/11/2022   Procedure: PERIPHERAL VASCULAR BALLOON ANGIOPLASTY;  Surgeon: Cephus Shelling, MD;  [facility];  Service: Cardiovascular;  Laterality: Right;  Peroneal   SHOULDER ARTHROSCOPY WITH OPEN ROTATOR CUFF REPAIR Right 08/25/2018   Procedure: SHOULDER ARTHROSCOPY WITH MINI OPEN ROTATOR CUFF REPAIR;  Surgeon: Juanell Fairly, MD;  Location: [facility] OR[facility];  Service: Orthopedics;  Laterality: Right;   TRANSURETHRAL RESECTION OF BLADDER TUMOR N/A 08/01/2018   Procedure: TRANSURETHRAL RESECTION OF BLADDER TUMOR (TURBT);  Surgeon: Sondra Come, MD;  Location: [facility];  Service: Urology;  Laterality: N/A;   URETEROSCOPY WITH HOLMIUM LASER LITHOTRIPSY Right 08/01/2018   Procedure: URETEROSCOPY WITH HOLMIUM LASER LITHOTRIPSY;  Surgeon: Sondra Come, MD;  Location: [facility] OR[facility];  Service: Urology;  Laterality: Right;     A IV Location/Drains/Wounds Patient Lines/Drains/Airways Status     Active Line/Drains/Airways     Name Placement date Placement time Site Days   Peripheral IV 06/21/23 20 G 1.88" Anterior;Right Forearm 06/21/23  1721  Forearm  less than 1   Fistula / Graft Left Forearm Arteriovenous fistula 10/31/20  0815  Forearm  963   Fistula / Graft Left Upper arm Arteriovenous fistula 07/09/21  0921  Upper  arm  712            Intake/Output Last 24 hours No intake or output data in the 24 hours ending 06/21/23 1821  Labs/Imaging Results for orders placed or performed during the hospital encounter of 06/21/23 (from the past 48 hour(s))  CBC with Differential     Status: Abnormal   Collection Time: 06/21/23  1:52 PM  Result Value Ref Range   WBC 6.0 4.0 - 10.5 K/uL   RBC 2.92 (L) 4.22 - 5.81 MIL/uL   Hemoglobin 9.6 (L) 13.0 - 17.0 g/dL   HCT 29.5 (L) 62.1 - 30.8 %   MCV 100.7 (H) 80.0 - 100.0 fL   MCH 32.9 26.0 - 34.0 pg   MCHC 32.7 30.0 - 36.0 g/dL   RDW 65.7 84.6 - 96.2 %   Platelets 140 (L) 150 - 400 K/uL   nRBC 0.0 0.0 - 0.2 %   Neutrophils Relative % 62 %   Neutro Abs 3.8 1.7 - 7.7 K/uL   Lymphocytes Relative 23 %   Lymphs Abs 1.4 0.7 - 4.0 K/uL   Monocytes Relative 9 %   Monocytes Absolute 0.5 0.1 - 1.0 K/uL  Eosinophils Relative 5 %   Eosinophils Absolute 0.3 0.0 - 0.5 K/uL   Basophils Relative 1 %   Basophils Absolute 0.0 0.0 - 0.1 K/uL   Immature Granulocytes 0 %   Abs Immature Granulocytes 0.02 0.00 - 0.07 K/uL    Comment: Performed at [facility], [address], [place], [place] [address]  Basic metabolic panel     Status: Abnormal   Collection Time: 06/21/23  1:52 PM  Result Value Ref Range   Sodium 140 135 - 145 mmol/L   Potassium 4.8 3.5 - 5.1 mmol/L   Chloride 111 98 - 111 mmol/L   CO2 16 (L) 22 - 32 mmol/L   Glucose, Bld 190 (H) 70 - 99 mg/dL    Comment: Glucose reference range applies only to samples taken after fasting for at least 8 hours.   BUN 109 (H) 8 - 23 mg/dL   Creatinine, Ser 47.82 (H) 0.61 - 1.24 mg/dL   Calcium 9.5 8.9 - 95.6 mg/dL   GFR, Estimated 4 (L) >60 mL/min    Comment: (NOTE) Calculated using the CKD-EPI Creatinine Equation (2021)    Anion gap 13 5 - 15    Comment: Performed at [facility], [address] [place], [place] [address]  I-stat chem 8, ED (not at [facility], [facility] or [facility])     Status: Abnormal   Collection  Time: 06/21/23  2:00 PM  Result Value Ref Range   Sodium 140 135 - 145 mmol/L   Potassium 4.8 3.5 - 5.1 mmol/L   Chloride 110 98 - 111 mmol/L   BUN 119 (H) 8 - 23 mg/dL   Creatinine, Ser 65.78 (H) 0.61 - 1.24 mg/dL   Glucose, Bld 469 (H) 70 - 99 mg/dL    Comment: Glucose reference range applies only to samples taken after fasting for at least 8 hours.   Calcium, Ion 1.21 1.15 - 1.40 mmol/L   TCO2 16 (L) 22 - 32 mmol/L   Hemoglobin 9.2 (L) 13.0 - 17.0 g/dL   HCT 62.9 (L) 52.8 - 41.3 %  Hepatitis B surface antigen     Status: None   Collection Time: 06/21/23  5:20 PM  Result Value Ref Range   Hepatitis B Surface Ag NON REACTIVE NON REACTIVE    Comment: Performed at [facility], [address] [place], [place] [address]   VAS US DUPLEX DIALYSIS ACCESS (AVF, AVG)  Result Date: 06/21/2023 DIALYSIS ACCESS Patient Name:  Jeremy Dorsey  Date of Exam:   06/21/2023 Medical Rec #: [id number]         Accession #:    [id number] Date of Birth: May 24, 1951         Patient Gender: M Patient Age:   4 years Exam Location:  [facility] Procedure:      VAS US DUPLEX DIALYSIS ACCESS (AVF, AVG) Referring Phys: Ivin Booty ROBINS --------------------------------------------------------------------------------  Access Site: Created 07/09/21. Failed left RC AVF. Access Type: Brachial-cephalic AVF. History: Mechanical complication - fistula not working. He has not been able to          do his last 3 dialysis sessions due to difficulity accessing his          fistula. Performing Technologist: Marilynne Halsted RDMS, RVT  Examination Guidelines: A complete evaluation includes B-mode imaging, spectral Doppler, color Doppler, and power Doppler as needed of all accessible portions of each vessel. Unilateral testing is considered an integral part of a complete examination. Limited examinations  for reoccurring indications may be performed as noted.  Findings: +---------------+----------+-----------------+--------+  AVF            PSV (cm/s)Flow Vol (mL/min)Comments +---------------+----------+-----------------+--------+ AVF Anastomosis   176                              +---------------+----------+-----------------+--------+  +------------+----------+-------------+----------+-------------------+ OUTFLOW VEINPSV (cm/s)Diameter (cm)Depth (cm)     Describe       +------------+----------+-------------+----------+-------------------+ Shoulder        22                           partially-occlusive +------------+----------+-------------+----------+-------------------+ Prox UA         69                           partially-occlusive +------------+----------+-------------+----------+-------------------+ Mid UA          25                           partially-occlusive +------------+----------+-------------+----------+-------------------+ Dist UA        104                                               +------------+----------+-------------+----------+-------------------+ AC Fossa       173                                               +------------+----------+-------------+----------+-------------------+   Summary: Arteriovenous fistula-Velocities less than 100cm/s noted. Arteriovenous fistula-Thrombus noted. *See table(s) above for measurements and observations.  Diagnosing physician: Gerarda Fraction Electronically signed by Gerarda Fraction on 06/21/2023 at 4:18:27 PM.   --------------------------------------------------------------------------------   Final     Pending Labs Unresulted Labs (From admission, onward)     Start     Ordered   06/23/23 0500  Heparin level (unfractionated)  Daily,   R      06/21/23 1633   06/22/23 0500  CBC  Daily,   R      06/21/23 1633   06/22/23 0500  Basic metabolic panel  Tomorrow morning,   R        06/21/23 1808   06/22/23 0100  Heparin level (unfractionated)  Once-Timed,   URGENT        06/21/23 1633   06/21/23 1806  CBC  (heparin)  Once,   R        Comments: Baseline for heparin therapy IF NOT ALREADY DRAWN.  Notify MD if PLT < 100 K.    06/21/23 1808   06/21/23 1806  Creatinine, serum  (heparin)  Once,   R       Comments: Baseline for heparin therapy IF NOT ALREADY DRAWN.    06/21/23 1808   06/21/23 1627  Hepatitis B surface antibody,quantitative  (New Admission Hemo Labs (Hepatitis B))  Once,   URGENT        06/21/23 1629            Vitals/Pain Today's Vitals   06/21/23 1430 06/21/23 1534 06/21/23 1600 06/21/23 1623  BP: (!) 176/86   (!) 157/83  Pulse: 71   71  Resp: 19   17  Temp:  97.9 F (36.6 C)    SpO2: 99%   98%  Weight:   103.9 kg   Height:   5\' 10"  (1.778 m)   PainSc:        Isolation Precautions No active isolations  Medications Medications  Chlorhexidine Gluconate Cloth 2 % PADS 6 each (has no administration in time range)  heparin ADULT infusion 100 units/mL (25000 units/265mL) (1,500 Units/hr Intravenous New Bag/Given 06/21/23 1736)  atorvastatin (LIPITOR) tablet 80 mg (has no administration in time range)  amLODipine (NORVASC) tablet 5 mg (has no administration in time range)  calcitRIOL (ROCALTROL) capsule 0.25 mcg (has no administration in time range)  carvedilol (COREG) tablet 12.5 mg (has no administration in time range)  cinacalcet (SENSIPAR) tablet 30 mg (has no administration in time range)  multivitamin (RENA-VIT) tablet 1 tablet (has no administration in time range)  sevelamer carbonate (RENVELA) tablet 800 mg (has no administration in time range)  torsemide (DEMADEX) tablet 100 mg (has no administration in time range)  acetaminophen (TYLENOL) tablet 650 mg (has no administration in time range)    Or  acetaminophen (TYLENOL) suppository 650 mg (has no administration in time range)  polyethylene glycol (MIRALAX / GLYCOLAX) packet 17 g (has no administration in time range)  ondansetron (ZOFRAN) tablet 4 mg (has no administration in time range)    Or  ondansetron (ZOFRAN) injection 4  mg (has no administration in time range)  heparin bolus via infusion 5,500 Units (5,500 Units Intravenous Bolus from Bag 06/21/23 1737)    Mobility walks     Focused Assessments Cardiac Assessment Handoff:  Cardiac Rhythm: Normal sinus rhythm No results found for: "CKTOTAL", "CKMB", "CKMBINDEX", "TROPONINI" No results found for: "DDIMER" Does the Patient currently have chest pain? No    R Recommendations: See Admitting Provider Note  Report given to:   Additional Notes:

## 2023-06-21 NOTE — Assessment & Plan Note (Signed)
-  Continue home Lipitor 

## 2023-06-21 NOTE — Consult Note (Signed)
Renal Service Consult Note [facility]  Jeremy Dorsey 06/21/2023 Jeremy Krabbe, MD Requesting Physician: Dr. Doran Dorsey  Reason for Consult: ESRD pt w/ malfunctioning AVF HPI: The patient is a 72 y.o. year-old w/ PMH as below who presented to ED after about 1 week dealing w/ a malfunctioning AVF.  Pt does home HD , his wife does the dialysis. Last week the venous pressures started going up, then got worse and worse as the week went on. They didn't have "really good dialysis" despite 3 sessions w/ full times. Their outpatient renal MD Dr Jeremy Dorsey couldn't get them in for a fistulogram so they were recommended to come to the hospital.  VVS called, ordered US of the AVF arm. We were asked to see for ESRD.    Pt seen in ED. Has been feeling okay despite marginal HD last week. On HD for 1.5 years. Using L upper arm AVF. Tried the L forearm first, not sure results. Denies any SOB, leg swelling, N/V or confusion.   ROS - denies CP, no joint pain, no HA, no blurry vision, no rash, no diarrhea, no nausea/ vomiting,   Past Medical History  Past Medical History:  Diagnosis Date   Anemia    Cancer (HCC)    basal cell nose and forehead   Cataract 2016   Cataract surgery bith eyes   CKD (chronic kidney disease), stage IV (HCC)    Coronary artery disease    a. s/p IVUS-guided DESx2 to prox & mid LAD, residual disease treated medically. EF 50-55% by recent echo 02/2020.   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   Urinary complication    Past Surgical History  Past Surgical History:  Procedure Laterality Date   ABDOMINAL AORTOGRAM W/LOWER EXTREMITY N/A 01/29/2022   Procedure: ABDOMINAL AORTOGRAM W/ Bilateral LOWER EXTREMITY Runoff;  Surgeon: Cephus Shelling, MD;  [facility]: [facility];  Service: Cardiovascular;   Laterality: N/A;   ABDOMINAL AORTOGRAM W/LOWER EXTREMITY N/A 06/11/2022   Procedure: ABDOMINAL AORTOGRAM W/LOWER EXTREMITY;  Surgeon: Cephus Shelling, MD;  [facility]: [facility];  Service: Cardiovascular;  Laterality: N/A;   AV FISTULA PLACEMENT Left 10/31/2020   Procedure: LEFT UPPER EXTREMITY ARTERIOVENOUS (AV) FISTULA CREATION;  Surgeon: Maeola Harman, MD;  Location[facility];  Service: Vascular;  Laterality: Left;   AV FISTULA PLACEMENT Left 07/09/2021   Procedure: LEFT ARM ARTERIOVENOUS (AV) FISTULA CREATION;  Surgeon: Nada Libman, MD;  [facility]: [facility];  Service: Vascular;  Laterality: Left;   COLONOSCOPY  1990   COLONOSCOPY  06/09/2011   Dr Lemar Livings   CORONARY STENT INTERVENTION N/A 04/24/2020   Procedure: CORONARY STENT INTERVENTION;  Surgeon: Iran Ouch, MD;  [facility];  Service: Cardiovascular;  Laterality: N/A;   CORONARY ULTRASOUND/IVUS N/A 04/24/2020   Procedure: Intravascular Ultrasound/IVUS;  Surgeon: Iran Ouch, MD;  [facility];  Service: Cardiovascular;  Laterality: N/A;   CYSTOSCOPY W/ RETROGRADES Bilateral 08/01/2018   Procedure: CYSTOSCOPY WITH RETROGRADE PYELOGRAM;  Surgeon: Sondra Come, MD;  Location: [facility];  Service: Urology;  Laterality: Bilateral;   CYSTOSCOPY WITH BIOPSY N/A 08/01/2018   Procedure: CYSTOSCOPY WITH Bladder BIOPSY;  Surgeon: Sondra Come, MD;  Location: [facility];  Service: Urology;  Laterality: N/A;   CYSTOSCOPY WITH STENT PLACEMENT Right 08/01/2018   Procedure: CYSTOSCOPY WITH STENT PLACEMENT;  Surgeon: Sondra Come, MD;  Location: [facility];  Service: Urology;  Laterality: Right;   EYE SURGERY Right    laser surgery   FRACTURE SURGERY     Rt shoulder rotaor cuff   LEFT HEART CATH AND CORONARY ANGIOGRAPHY N/A 04/24/2020   Procedure: LEFT HEART CATH AND CORONARY ANGIOGRAPHY;  Surgeon: Iran Ouch, MD;  [facility];  Service:  Cardiovascular;  Laterality: N/A;   LEFT HEART CATH AND CORONARY ANGIOGRAPHY Left 04/16/2023   Procedure: LEFT HEART CATH AND CORONARY ANGIOGRAPHY;  Surgeon: Iran Ouch, MD;  Location: [facility];  Service: Cardiovascular;  Laterality: Left;   PERIPHERAL VASCULAR BALLOON ANGIOPLASTY Right 01/29/2022   Procedure: PERIPHERAL VASCULAR BALLOON ANGIOPLASTY;  Surgeon: Cephus Shelling, MD;  [facility];  Service: Cardiovascular;  Laterality: Right;   PERIPHERAL VASCULAR BALLOON ANGIOPLASTY Right 06/11/2022   Procedure: PERIPHERAL VASCULAR BALLOON ANGIOPLASTY;  Surgeon: Cephus Shelling, MD;  [facility];  Service: Cardiovascular;  Laterality: Right;  Peroneal   SHOULDER ARTHROSCOPY WITH OPEN ROTATOR CUFF REPAIR Right 08/25/2018   Procedure: SHOULDER ARTHROSCOPY WITH MINI OPEN ROTATOR CUFF REPAIR;  Surgeon: Juanell Fairly, MD;  Location: [facility];  Service: Orthopedics;  Laterality: Right;   TRANSURETHRAL RESECTION OF BLADDER TUMOR N/A 08/01/2018   Procedure: TRANSURETHRAL RESECTION OF BLADDER TUMOR (TURBT);  Surgeon: Sondra Come, MD;  Location: [facility];  Service: Urology;  Laterality: N/A;   URETEROSCOPY WITH HOLMIUM LASER LITHOTRIPSY Right 08/01/2018   Procedure: URETEROSCOPY WITH HOLMIUM LASER LITHOTRIPSY;  Surgeon: Sondra Come, MD;  Location: [facility];  Service: Urology;  Laterality: Right;   Family History  Family History  Problem Relation Age of Onset   Psoriasis Mother    Heart failure Father    Social History  reports that he quit smoking about 15 years ago. His smoking use included cigarettes. He started smoking about 45 years ago. He has a 30 pack-year smoking history. He has been exposed to tobacco smoke. He has never used smokeless tobacco. He reports that he does not drink alcohol and does not use drugs. Allergies  Allergies  Allergen Reactions   Oxycodone Nausea And Vomiting   Hydrocodone Nausea And Vomiting    Home medications Prior to Admission medications   Medication Sig Start Date End Date Taking? Authorizing Provider  amLODipine (NORVASC) 5 MG tablet Take 5 mg by mouth every evening.    [provider]  aspirin EC (ASPIRIN LOW DOSE) 81 MG tablet TAKE 1 TABLET BY MOUTH EVERY MORNING (SWALLOW WHOLE) Patient taking differently: Take 81 mg by mouth daily. 04/13/23   Antonieta Iba, MD  atorvastatin (LIPITOR) 80 MG tablet TAKE 1 TABLET BY MOUTH DAILY 04/13/23   Antonieta Iba, MD  AURYXIA 1 GM 210 MG(Fe) tablet Take by mouth. 04/22/22   [provider]  calcitRIOL (ROCALTROL) 0.25 MCG capsule Take 0.25 mcg by mouth in the morning.    [provider]  carvedilol (COREG) 12.5 MG tablet Take 1 tablet by mouth 2 (two) times daily. 08/24/22   [provider]  cinacalcet (SENSIPAR) 30 MG tablet Take 30 mg by mouth every Monday, Wednesday, and Friday.    [provider]  clopidogrel (PLAVIX) 75 MG tablet TAKE 1 TABLET BY MOUTH DAILY 04/13/23   Antonieta Iba, MD  lidocaine-prilocaine (EMLA) cream  Apply 1 Application topically daily as needed (prior to port being accessed.). 01/07/22   [provider]  Methoxy PEG-Epoetin Beta (MIRCERA IJ) Inject into the skin. 03/25/23   [provider]  Methoxy PEG-Epoetin Beta (MIRCERA IJ) Inject into the skin. 04/26/23   [provider]  multivitamin (RENA-VIT) TABS tablet Take 1 tablet by mouth daily.    [provider]  mupirocin cream (BACTROBAN) 2 % Apply 1 Application topically daily. 03/25/23   [provider]  nitroGLYCERIN (NITROSTAT) 0.4 MG SL tablet Place 1 tablet (0.4 mg total) under the tongue every 5 (five) minutes x 3 doses as needed for chest pain. 03/15/23   Antonieta Iba, MD  sevelamer carbonate (RENVELA) 800 MG tablet Take 800 mg by mouth with breakfast, with lunch, and with evening meal. 12/22/21   [provider]  sildenafil (VIAGRA) 100 MG tablet Take  100 mg by mouth as needed for erectile dysfunction. 03/30/22   [provider]  torsemide (DEMADEX) 100 MG tablet Take 100 mg by mouth every morning. 01/20/22   [provider]     Vitals:   06/21/23 1348 06/21/23 1400 06/21/23 1415 06/21/23 1430  BP:  (!) 168/82 (!) 156/77 (!) 176/86  Pulse: 67 69 61 71  Resp: 18   19  Temp:      SpO2: 96% 96% 97% 99%   Exam Gen alert, no distress No rash, cyanosis or gangrene Sclera anicteric, throat clear  No jvd or bruits Chest clear bilat to bases, no rales/ wheezing RRR no MRG Abd soft ntnd no mass or ascites +bs GU normal male MS no joint effusions or deformity Ext no LE or UE edema, no wounds or ulcers Neuro is alert, Ox 3 , nf    LUA AVF +bruit but is very faint      Renal-related home meds: - norvasc 5 - auryxia 1 ac tid - coreg 12.5 bid - sensipar 30 mg mwf - renavite - renvela 800mg  ac tid - torsemide 100mg  qam    OP HD: home HD , Dr Marisue Humble 3x/ week  4h  450 bfr  102kg   AVF   Heparin 5000  - last HD 11/15, just 50 min - HD 11/13 was just 1h long - dry wt lowered recently104 --> 102kg   Assessment/ Plan: Malfunctioning AV fistula - has had 3-4 procedures to "open up a blockage" in the past 1.5 yrs on dialysis. Is on home HD. VVS consulting, appreciate assistance.   ESKD - on HD MWF at home. Will be available for any inpatient HD that is needed.  HTN - BP's okay, cont home meds Volume - no vol excess on exam. Get wts.  Anemia of eskd - Hb 9-10 here. Follow.     Vinson Moselle  MD [facility] 06/21/2023, 3:29 PM  Recent Labs  Lab 06/21/23 1352 06/21/23 1400  HGB 9.6* 9.2*  CALCIUM 9.5  --   CREATININE 12.23* 12.80*  K 4.8 4.8   Inpatient medications:

## 2023-06-21 NOTE — Assessment & Plan Note (Addendum)
Nephrology and vascular surgery are on board, concern of thrombosis causing fistula malfunctioning so IR was consulted. Patient will be n.p.o. after midnight and IR will try to declot tomorrow morning and if unsuccessful then vascular will placed a permanent HD catheter for continuation of dialysis. -Patient was started on heparin infusion

## 2023-06-21 NOTE — Consult Note (Signed)
Hospital Consult   Reason for Consult: Left arm brachiocephalic fistula malfunction Requesting Physician: ED MRN #:  [id number]  History of Present Illness: This is a 72 y.o. male well-known to the vascular surgery service having undergone multiple lower extremity arteriograms for PAD, right sided radiocephalic, left-sided brachiocephalic fistulas for end-stage renal disease.  He has been using the left sided brachiocephalic fistula without issue until recently.  Per his wife, the fistula has been very pulsatile, and the cannulae usually used to give blood back is not working appropriately.  Who presents to the ED after no dialysis for the last week.  On exam, he was lethargic, but able to answer questions.  Denied symptoms of steal syndrome.     Past Medical History:  Diagnosis Date   Anemia    Cancer (HCC)    basal cell nose and forehead   Cataract 2016   Cataract surgery bith eyes   CKD (chronic kidney disease), stage IV (HCC)    Coronary artery disease    a. s/p IVUS-guided DESx2 to prox & mid LAD, residual disease treated medically. EF 50-55% by recent echo 02/2020.   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   Urinary complication     Past Surgical History:  Procedure Laterality Date   ABDOMINAL AORTOGRAM W/LOWER EXTREMITY N/A 01/29/2022   Procedure: ABDOMINAL AORTOGRAM W/ Bilateral LOWER EXTREMITY Runoff;  Surgeon: Cephus Shelling, MD;  [facility]: [facility];  Service: Cardiovascular;  Laterality: N/A;   ABDOMINAL AORTOGRAM W/LOWER EXTREMITY N/A 06/11/2022   Procedure: ABDOMINAL AORTOGRAM W/LOWER EXTREMITY;  Surgeon: Cephus Shelling, MD;  [facility]: [facility];  Service: Cardiovascular;  Laterality: N/A;   AV FISTULA PLACEMENT Left 10/31/2020   Procedure: LEFT UPPER EXTREMITY ARTERIOVENOUS (AV)  FISTULA CREATION;  Surgeon: Maeola Harman, MD;  Location: [facility] OR;  Service: Vascular;  Laterality: Left;   AV FISTULA PLACEMENT Left 07/09/2021   Procedure: LEFT ARM ARTERIOVENOUS (AV) FISTULA CREATION;  Surgeon: Nada Libman, MD;  [facility]: [facility];  Service: Vascular;  Laterality: Left;   COLONOSCOPY  1990   COLONOSCOPY  06/09/2011   Dr Lemar Livings   CORONARY STENT INTERVENTION N/A 04/24/2020   Procedure: CORONARY STENT INTERVENTION;  Surgeon: Iran Ouch, MD;  [facility];  Service: Cardiovascular;  Laterality: N/A;   CORONARY ULTRASOUND/IVUS N/A 04/24/2020   Procedure: Intravascular Ultrasound/IVUS;  Surgeon: Iran Ouch, MD;  [facility];  Service: Cardiovascular;  Laterality: N/A;   CYSTOSCOPY W/ RETROGRADES Bilateral 08/01/2018   Procedure: CYSTOSCOPY WITH RETROGRADE PYELOGRAM;  Surgeon: Sondra Come, MD;  Location: [facility];  Service: Urology;  Laterality: Bilateral;   CYSTOSCOPY WITH BIOPSY N/A 08/01/2018   Procedure: CYSTOSCOPY WITH Bladder BIOPSY;  Surgeon: Sondra Come, MD;  Location: [facility];  Service: Urology;  Laterality: N/A;   CYSTOSCOPY WITH STENT PLACEMENT Right 08/01/2018   Procedure: CYSTOSCOPY WITH STENT PLACEMENT;  Surgeon: Sondra Come, MD;  Location: [facility];  Service: Urology;  Laterality: Right;   EYE SURGERY Right    laser surgery   FRACTURE SURGERY     Rt shoulder rotaor cuff   LEFT HEART CATH AND CORONARY ANGIOGRAPHY N/A 04/24/2020   Procedure: LEFT HEART CATH AND CORONARY ANGIOGRAPHY;  Surgeon: Kirke Corin,  Chelsea Aus, MD;  [facility]: [facility];  Service: Cardiovascular;  Laterality: N/A;   LEFT HEART CATH AND CORONARY ANGIOGRAPHY Left 04/16/2023   Procedure: LEFT HEART CATH AND CORONARY ANGIOGRAPHY;  Surgeon: Iran Ouch, MD;  Location: [facility];  Service: Cardiovascular;  Laterality: Left;   PERIPHERAL VASCULAR BALLOON ANGIOPLASTY Right 01/29/2022   Procedure: PERIPHERAL  VASCULAR BALLOON ANGIOPLASTY;  Surgeon: Cephus Shelling, MD;  [facility];  Service: Cardiovascular;  Laterality: Right;   PERIPHERAL VASCULAR BALLOON ANGIOPLASTY Right 06/11/2022   Procedure: PERIPHERAL VASCULAR BALLOON ANGIOPLASTY;  Surgeon: Cephus Shelling, MD;  [facility]: [facility];  Service: Cardiovascular;  Laterality: Right;  Peroneal   SHOULDER ARTHROSCOPY WITH OPEN ROTATOR CUFF REPAIR Right 08/25/2018   Procedure: SHOULDER ARTHROSCOPY WITH MINI OPEN ROTATOR CUFF REPAIR;  Surgeon: Juanell Fairly, MD;  Location: [facility];  Service: Orthopedics;  Laterality: Right;   TRANSURETHRAL RESECTION OF BLADDER TUMOR N/A 08/01/2018   Procedure: TRANSURETHRAL RESECTION OF BLADDER TUMOR (TURBT);  Surgeon: Sondra Come, MD;  Location: [facility];  Service: Urology;  Laterality: N/A;   URETEROSCOPY WITH HOLMIUM LASER LITHOTRIPSY Right 08/01/2018   Procedure: URETEROSCOPY WITH HOLMIUM LASER LITHOTRIPSY;  Surgeon: Sondra Come, MD;  Location: [facility];  Service: Urology;  Laterality: Right;    Allergies  Allergen Reactions   Oxycodone Nausea And Vomiting   Hydrocodone Nausea And Vomiting    Prior to Admission medications   Medication Sig Start Date End Date Taking? Authorizing Provider  acetaminophen (TYLENOL) 500 MG tablet Take 1,500-2,000 mg by mouth every 6 (six) hours as needed for mild pain (pain score 1-3) or moderate pain (pain score 4-6).   Yes [provider]  amLODipine (NORVASC) 5 MG tablet Take 5 mg by mouth in the morning.   Yes [provider]  aspirin EC (ASPIRIN LOW DOSE) 81 MG tablet TAKE 1 TABLET BY MOUTH EVERY MORNING (SWALLOW WHOLE) Patient taking differently: Take 81 mg by mouth daily. 04/13/23  Yes Antonieta Iba, MD  atorvastatin (LIPITOR) 80 MG tablet TAKE 1 TABLET BY MOUTH DAILY 04/13/23  Yes Gollan, Tollie Pizza, MD  AURYXIA 1 GM 210 MG(Fe) tablet Take by mouth. 04/22/22  Yes [provider]  calcitRIOL  (ROCALTROL) 0.25 MCG capsule Take 0.25 mcg by mouth in the morning.   Yes [provider]  carvedilol (COREG) 12.5 MG tablet Take 1 tablet by mouth 2 (two) times daily. 08/24/22  Yes [provider]  cinacalcet (SENSIPAR) 30 MG tablet Take 30 mg by mouth every Monday, Wednesday, and Friday.   Yes [provider]  clopidogrel (PLAVIX) 75 MG tablet TAKE 1 TABLET BY MOUTH DAILY 04/13/23  Yes Antonieta Iba, MD  lidocaine-prilocaine (EMLA) cream Apply 1 Application topically every Monday, Wednesday, and Friday. 01/07/22  Yes [provider]  Methoxy PEG-Epoetin Beta (MIRCERA IJ) Inject 1 each into the skin See admin instructions. During Dialysis 04/26/23  Yes [provider]  multivitamin (RENA-VIT) TABS tablet Take 1 tablet by mouth daily.   Yes [provider]  mupirocin cream (BACTROBAN) 2 % Apply 1 Application topically daily. 03/25/23  Yes [provider]  nitroGLYCERIN (NITROSTAT) 0.4 MG SL tablet Place 1 tablet (0.4 mg total) under the tongue every 5 (five) minutes x 3 doses as needed for chest pain. 03/15/23  Yes Antonieta Iba, MD  sevelamer carbonate (RENVELA) 800 MG tablet Take 800 mg by mouth with breakfast, with lunch, and with evening meal.  12/22/21  Yes [provider]  sildenafil (VIAGRA) 100 MG tablet Take 100 mg by mouth as needed for erectile dysfunction. 03/30/22  Yes [provider]  torsemide (DEMADEX) 100 MG tablet Take 100 mg by mouth every morning. 01/20/22  Yes [provider]    Social History   Socioeconomic History   Marital status: Married    Spouse name: Not on file   Number of children: 3   Years of education: Not on file   Highest education level: Bachelor's degree (e.g., BA, AB, BS)  Occupational History    Comment: retired  Tobacco Use   Smoking status: Former    Current packs/day: 0.00    Average packs/day: 1 pack/day for 30.0 years (30.0 ttl pk-yrs)    Types: Cigarettes     Start date: 08/22/1977    Quit date: 08/23/2007    Years since quitting: 15.8    Passive exposure: Past   Smokeless tobacco: Never  Vaping Use   Vaping status: Never Used  Substance and Sexual Activity   Alcohol use: No   Drug use: No   Sexual activity: Not Currently    Birth control/protection: None  Other Topics Concern   Not on file  Social History Narrative   Not on file   Social Determinants of Health   Financial Resource Strain: Patient Declined (03/02/2023)   Overall Financial Resource Strain (CARDIA)    Difficulty of Paying Living Expenses: Patient declined  Food Insecurity: Patient Declined (03/02/2023)   Hunger Vital Sign    Worried About Running Out of Food in the Last Year: Patient declined    Ran Out of Food in the Last Year: Patient declined  Transportation Needs: Patient Declined (03/02/2023)   PRAPARE - Administrator, Civil Service (Medical): Patient declined    Lack of Transportation (Non-Medical): Patient declined  Physical Activity: Patient Declined (03/02/2023)   Exercise Vital Sign    Days of Exercise per Week: Patient declined    Minutes of Exercise per Session: Patient declined  Stress: Patient Declined (03/02/2023)   [employer] of Occupational Health - Occupational Stress Questionnaire    Feeling of Stress : Patient declined  Social Connections: Unknown (03/02/2023)   Social Connection and Isolation Panel [NHANES]    Frequency of Communication with Friends and Family: Patient declined    Frequency of Social Gatherings with Friends and Family: Patient declined    Attends Religious Services: Not on Insurance claims handler of Clubs or Organizations: Patient declined    Attends Banker Meetings: Patient declined    Marital Status: Patient declined  Intimate Partner Violence: Not At Risk (03/03/2023)   Humiliation, Afraid, Rape, and Kick questionnaire    Fear of Current or Ex-Partner: No    Emotionally Abused: No     Physically Abused: No    Sexually Abused: No    Family History  Problem Relation Age of Onset   Psoriasis Mother    Heart failure Father     ROS: Otherwise negative unless mentioned in HPI  Physical Examination  Vitals:   06/21/23 1430 06/21/23 1534  BP: (!) 176/86   Pulse: 71   Resp: 19   Temp:  97.9 F (36.6 C)  SpO2: 99%    There is no height or weight on file to calculate BMI.  General:  WDWN in NAD Gait: Not observed HENT: WNL, normocephalic Pulmonary: normal non-labored breathing, without Rales, rhonchi,  wheezing Cardiac: regular Abdomen:  soft, NT/ND,  no masses Skin: without rashes Vascular Exam/Pulses: 2+radial, pulsatile left brachiocephalic fistula Extremities: without ischemic changes, without Gangrene , without cellulitis; without open wounds;  Musculoskeletal: no muscle wasting or atrophy  Neurologic: A&O X 3;  No focal weakness or paresthesias are detected; speech is fluent/normal Psychiatric:  Tired Lymph:  Unremarkable  CBC    Component Value Date/Time   WBC 6.0 06/21/2023 1352   RBC 2.92 (L) 06/21/2023 1352   HGB 9.2 (L) 06/21/2023 1400   HGB 10.7 (L) 04/30/2023 1210   HCT 27.0 (L) 06/21/2023 1400   HCT 32.5 (L) 04/30/2023 1210   PLT 140 (L) 06/21/2023 1352   PLT 162 04/30/2023 1210   MCV 100.7 (H) 06/21/2023 1352   MCV 96 04/30/2023 1210   MCH 32.9 06/21/2023 1352   MCHC 32.7 06/21/2023 1352   RDW 12.6 06/21/2023 1352   RDW 11.9 04/30/2023 1210   LYMPHSABS 1.4 06/21/2023 1352   LYMPHSABS 2.8 04/07/2016 1021   MONOABS 0.5 06/21/2023 1352   EOSABS 0.3 06/21/2023 1352   EOSABS 0.4 04/07/2016 1021   BASOSABS 0.0 06/21/2023 1352   BASOSABS 0.0 04/07/2016 1021    BMET    Component Value Date/Time   NA 140 06/21/2023 1400   NA 140 04/30/2023 1210   K 4.8 06/21/2023 1400   CL 110 06/21/2023 1400   CO2 16 (L) 06/21/2023 1352   GLUCOSE 183 (H) 06/21/2023 1400   BUN 119 (H) 06/21/2023 1400   BUN 51 (H) 04/30/2023 1210   CREATININE  12.80 (H) 06/21/2023 1400   CALCIUM 9.5 06/21/2023 1352   GFRNONAA 4 (L) 06/21/2023 1352   GFRAA 12 (L) 04/29/2020 1052    COAGS: Lab Results  Component Value Date   INR 1.09 08/22/2018      ASSESSMENT/PLAN: This is a 72 y.o. male with end-stage renal disease and malfunctioning left arm brachiocephalic fistula.  I was present when the ultrasonographer was obtaining images.  There is thrombus that is occlusive in the left brachio cephalic fistula.  He would benefit from IR thrombectomy. I will defer management to their team.   Recommend heparin drip so the thrombus is not propagate further.  Please call if questions or concerns arise.    Victorino Sparrow MD [facility] [phone] 06/21/2023  3:52 PM

## 2023-06-21 NOTE — Assessment & Plan Note (Signed)
-   Continue home amlodipine and Coreg

## 2023-06-21 NOTE — ED Provider Notes (Signed)
[place] EMERGENCY DEPARTMENT AT [facility] Provider Note   CSN: [id number] Arrival date & time: 06/21/23  1112     History  Chief Complaint  Patient presents with   Vascular Access Problem    Jeremy Dorsey is a 72 y.o. male.  72 yo M with a chief complaints of his fistula not working.  He has not been able to do his last 3 dialysis sessions due to difficulty accessing his fistula.  He says that he does dialysis at home.  There was a discussion with Dr. Marisue Humble and he recommended that he come here to have it evaluated.  He denies any significant fluid retention.  It sounds like he has been able to do a partial dialysis session each of the 3 days.          Home Medications Prior to Admission medications   Medication Sig Start Date End Date Taking? Authorizing Provider  acetaminophen (TYLENOL) 500 MG tablet Take 1,500-2,000 mg by mouth every 6 (six) hours as needed for mild pain (pain score 1-3) or moderate pain (pain score 4-6).   Yes [provider]  amLODipine (NORVASC) 5 MG tablet Take 5 mg by mouth in the morning.   Yes [provider]  aspirin EC (ASPIRIN LOW DOSE) 81 MG tablet TAKE 1 TABLET BY MOUTH EVERY MORNING (SWALLOW WHOLE) Patient taking differently: Take 81 mg by mouth daily. 04/13/23  Yes Antonieta Iba, MD  atorvastatin (LIPITOR) 80 MG tablet TAKE 1 TABLET BY MOUTH DAILY 04/13/23  Yes Gollan, Tollie Pizza, MD  AURYXIA 1 GM 210 MG(Fe) tablet Take by mouth. 04/22/22  Yes [provider]  calcitRIOL (ROCALTROL) 0.25 MCG capsule Take 0.25 mcg by mouth in the morning.   Yes [provider]  carvedilol (COREG) 12.5 MG tablet Take 1 tablet by mouth 2 (two) times daily. 08/24/22  Yes [provider]  cinacalcet (SENSIPAR) 30 MG tablet Take 30 mg by mouth every Monday, Wednesday, and Friday.   Yes [provider]  clopidogrel (PLAVIX) 75 MG tablet TAKE 1 TABLET BY MOUTH DAILY 04/13/23  Yes Antonieta Iba,  MD  lidocaine-prilocaine (EMLA) cream Apply 1 Application topically every Monday, Wednesday, and Friday. 01/07/22  Yes [provider]  Methoxy PEG-Epoetin Beta (MIRCERA IJ) Inject into the skin. 04/26/23  Yes [provider]  multivitamin (RENA-VIT) TABS tablet Take 1 tablet by mouth daily.   Yes [provider]  mupirocin cream (BACTROBAN) 2 % Apply 1 Application topically daily. 03/25/23  Yes [provider]  nitroGLYCERIN (NITROSTAT) 0.4 MG SL tablet Place 1 tablet (0.4 mg total) under the tongue every 5 (five) minutes x 3 doses as needed for chest pain. 03/15/23  Yes Antonieta Iba, MD  sildenafil (VIAGRA) 100 MG tablet Take 100 mg by mouth as needed for erectile dysfunction. 03/30/22  Yes [provider]  torsemide (DEMADEX) 100 MG tablet Take 100 mg by mouth every morning. 01/20/22  Yes [provider]  sevelamer carbonate (RENVELA) 800 MG tablet Take 800 mg by mouth with breakfast, with lunch, and with evening meal. 12/22/21   [provider]      Allergies    Oxycodone and Hydrocodone    Review of Systems   Review of Systems  Physical Exam Updated Vital Signs BP (!) 176/86   Pulse 71   Temp 97.9 F (36.6 C)   Resp 19   SpO2 99%  Physical Exam Vitals and nursing note reviewed.  Constitutional:  Appearance: He is well-developed.  HENT:     Head: Normocephalic and atraumatic.  Eyes:     Pupils: Pupils are equal, round, and reactive to light.  Neck:     Vascular: No JVD.  Cardiovascular:     Rate and Rhythm: Normal rate and regular rhythm.     Heart sounds: No murmur heard.    No friction rub. No gallop.  Pulmonary:     Effort: No respiratory distress.     Breath sounds: No wheezing.  Abdominal:     General: There is no distension.     Tenderness: There is no abdominal tenderness. There is no guarding or rebound.  Musculoskeletal:        General: Normal range of motion.     Cervical back: Normal range of  motion and neck supple.     Comments: The left AV fistula has a palpable pulse though I do not appreciate an obvious thrill  Skin:    Coloration: Skin is not pale.     Findings: No rash.  Neurological:     Mental Status: He is alert and oriented to person, place, and time.  Psychiatric:        Behavior: Behavior normal.     ED Results / Procedures / Treatments   Labs (all labs ordered are listed, but only abnormal results are displayed) Labs Reviewed  CBC WITH DIFFERENTIAL/PLATELET - Abnormal; Notable for the following components:      Result Value   RBC 2.92 (*)    Hemoglobin 9.6 (*)    HCT 29.4 (*)    MCV 100.7 (*)    Platelets 140 (*)    All other components within normal limits  BASIC METABOLIC PANEL - Abnormal; Notable for the following components:   CO2 16 (*)    Glucose, Bld 190 (*)    BUN 109 (*)    Creatinine, Ser 12.23 (*)    GFR, Estimated 4 (*)    All other components within normal limits  I-STAT CHEM 8, ED - Abnormal; Notable for the following components:   BUN 119 (*)    Creatinine, Ser 12.80 (*)    Glucose, Bld 183 (*)    TCO2 16 (*)    Hemoglobin 9.2 (*)    HCT 27.0 (*)    All other components within normal limits    EKG None  Radiology No results found.  Procedures .Critical Care  Performed by: Melene Plan, DO Authorized by: Melene Plan, DO   Critical care provider statement:    Critical care time (minutes):  35   Critical care time was exclusive of:  Separately billable procedures and treating other patients   Critical care was time spent personally by me on the following activities:  Development of treatment plan with patient or surrogate, discussions with consultants, evaluation of patient's response to treatment, examination of patient, ordering and review of laboratory studies, ordering and review of radiographic studies, ordering and performing treatments and interventions, pulse oximetry, re-evaluation of patient's condition and review of  old charts   Care discussed with: admitting provider       Medications Ordered in ED Medications - No data to display  ED Course/ Medical Decision Making/ A&P Clinical Course as of 06/21/23 1541  Mon Jun 21, 2023  1520 Stable 41 YOM with a chief complaint of Vasc. Acc. Prob. Vascular to see. Formal Duplex pending. No thrill.  [CC]    Clinical Course User Index [CC] Glyn Ade, MD  Medical Decision Making Amount and/or Complexity of Data Reviewed Labs: ordered. ECG/medicine tests: ordered.   72 yo M with a chief complaints of inability to complete dialysis with his left AV fistula.  The patient does dialysis at home.  Visit Monday Wednesday and Friday.  He is unable to have a complete session his last 3 sessions.  They spoke with his nephrologist and they recommended he come to the ED for evaluation.  Patient with a significant elevation in his BUN.  Potassium is normal.  Bicarb 16.  I discussed this with Dr. Arta Silence, nephrology who recommended calling vascular surgery.  Discussed case with Dr. Karin Lieu  He recommended an ultrasound of the fistula and he evaluated the patient at bedside.  Plans to repair this surgically tomorrow.  Requesting medical admission.  The patients results and plan were reviewed and discussed.   Any x-rays performed were independently reviewed by myself.   Differential diagnosis were considered with the presenting HPI.  Medications - No data to display  Vitals:   06/21/23 1400 06/21/23 1415 06/21/23 1430 06/21/23 1534  BP: (!) 168/82 (!) 156/77 (!) 176/86   Pulse: 69 61 71   Resp:   19   Temp:    97.9 F (36.6 C)  SpO2: 96% 97% 99%     Final diagnoses:  Complication of vascular access for dialysis, initial encounter  Uremia    Admission/ observation were discussed with the admitting physician, patient and/or family and they are comfortable with the plan.         Final Clinical Impression(s) / ED  Diagnoses Final diagnoses:  Complication of vascular access for dialysis, initial encounter  Uremia    Rx / DC Orders ED Discharge Orders     None         Melene Plan, DO 06/21/23 1541

## 2023-06-21 NOTE — Assessment & Plan Note (Signed)
S/p PCI with 2 stents placed in 2021. No chest pain. -Holding home aspirin and Plavix for the procedure

## 2023-06-22 ENCOUNTER — Encounter (HOSPITAL_COMMUNITY)
Admission: EM | Disposition: A | Discharge status: Other Acute Inpatient Hospital | Payer: Self-pay | Source: Home / Self Care | Attending: Emergency Medicine

## 2023-06-22 ENCOUNTER — Observation Stay (HOSPITAL_COMMUNITY): Payer: Medicare HMO

## 2023-06-22 DIAGNOSIS — N19 Unspecified kidney failure: Secondary | ICD-10-CM

## 2023-06-22 DIAGNOSIS — T85858A Stenosis due to other internal prosthetic devices, implants and grafts, initial encounter: Secondary | ICD-10-CM | POA: Diagnosis not present

## 2023-06-22 DIAGNOSIS — T829XXA Unspecified complication of cardiac and vascular prosthetic device, implant and graft, initial encounter: Secondary | ICD-10-CM

## 2023-06-22 DIAGNOSIS — T85868A Thrombosis due to other internal prosthetic devices, implants and grafts, initial encounter: Secondary | ICD-10-CM | POA: Diagnosis not present

## 2023-06-22 HISTORY — PX: IR US GUIDE VASC ACCESS LEFT: IMG2389

## 2023-06-22 HISTORY — PX: IR THROMBECTOMY AV FISTULA W/THROMBOLYSIS/PTA INC/SHUNT/IMG LEFT: IMG6106

## 2023-06-22 LAB — BASIC METABOLIC PANEL
Anion gap: 16 — ABNORMAL HIGH (ref 5–15)
BUN: 111 mg/dL — ABNORMAL HIGH (ref 8–23)
CO2: 15 mmol/L — ABNORMAL LOW (ref 22–32)
Calcium: 9.3 mg/dL (ref 8.9–10.3)
Chloride: 108 mmol/L (ref 98–111)
Creatinine, Ser: 12.16 mg/dL — ABNORMAL HIGH (ref 0.61–1.24)
GFR, Estimated: 4 mL/min — ABNORMAL LOW (ref >60)
Glucose, Bld: 179 mg/dL — ABNORMAL HIGH (ref 70–99)
Potassium: 4.5 mmol/L (ref 3.5–5.1)
Sodium: 139 mmol/L (ref 135–145)

## 2023-06-22 LAB — CBC
HCT: 29.8 % — ABNORMAL LOW (ref 39.0–52.0)
Hemoglobin: 10 g/dL — ABNORMAL LOW (ref 13.0–17.0)
MCH: 32.4 pg (ref 26.0–34.0)
MCHC: 33.6 g/dL (ref 30.0–36.0)
MCV: 96.4 fL (ref 80.0–100.0)
Platelets: 151 10*3/uL (ref 150–400)
RBC: 3.09 MIL/uL — ABNORMAL LOW (ref 4.22–5.81)
RDW: 12.7 % (ref 11.5–15.5)
WBC: 7.6 10*3/uL (ref 4.0–10.5)
nRBC: 0 % (ref 0.0–0.2)

## 2023-06-22 LAB — GLUCOSE, CAPILLARY
Glucose-Capillary: 131 mg/dL — ABNORMAL HIGH (ref 70–99)
Glucose-Capillary: 123 mg/dL — ABNORMAL HIGH (ref 70–99)
Glucose-Capillary: 121 mg/dL — ABNORMAL HIGH (ref 70–99)

## 2023-06-22 LAB — HEPARIN LEVEL (UNFRACTIONATED)
Heparin Unfractionated: 0.25 [IU]/mL — ABNORMAL LOW (ref 0.30–0.70)
Heparin Unfractionated: 0.25 [IU]/mL — ABNORMAL LOW (ref 0.30–0.70)

## 2023-06-22 SURGERY — FISTULOGRAM
Anesthesia: General | Laterality: Left

## 2023-06-22 MED ORDER — LIDOCAINE-EPINEPHRINE 1 %-1:100000 IJ SOLN
20.0000 mL | Freq: Once | INTRAMUSCULAR | Status: AC
  Administered 2023-06-22: 10 mL via INTRADERMAL

## 2023-06-22 MED ORDER — ONDANSETRON HCL 4 MG/2ML IJ SOLN
INTRAMUSCULAR | Status: AC
  Filled 2023-06-22: qty 2

## 2023-06-22 MED ORDER — FENTANYL CITRATE (PF) 100 MCG/2ML IJ SOLN
INTRAMUSCULAR | Status: AC
  Filled 2023-06-22: qty 4

## 2023-06-22 MED ORDER — MIDAZOLAM HCL 2 MG/2ML IJ SOLN
INTRAMUSCULAR | Status: AC
  Filled 2023-06-22: qty 4

## 2023-06-22 MED ORDER — FENTANYL CITRATE (PF) 100 MCG/2ML IJ SOLN
INTRAMUSCULAR | Status: AC | PRN
  Administered 2023-06-22: 50 ug via INTRAVENOUS
  Administered 2023-06-22 (×3): 25 ug via INTRAVENOUS
  Administered 2023-06-22: 50 ug via INTRAVENOUS

## 2023-06-22 MED ORDER — ALTEPLASE 2 MG IJ SOLR
INTRAMUSCULAR | Status: AC | PRN
  Administered 2023-06-22: 2 mg

## 2023-06-22 MED ORDER — LIDOCAINE-EPINEPHRINE 1 %-1:100000 IJ SOLN
INTRAMUSCULAR | Status: AC
  Filled 2023-06-22: qty 1

## 2023-06-22 MED ORDER — IOHEXOL 300 MG/ML  SOLN
100.0000 mL | Freq: Once | INTRAMUSCULAR | Status: AC | PRN
  Administered 2023-06-22: 60 mL via INTRA_ARTERIAL

## 2023-06-22 MED ORDER — ALTEPLASE 2 MG IJ SOLR
INTRAMUSCULAR | Status: AC
  Filled 2023-06-22: qty 4

## 2023-06-22 MED ORDER — HEPARIN SODIUM (PORCINE) 1000 UNIT/ML IJ SOLN
INTRAMUSCULAR | Status: AC
  Filled 2023-06-22: qty 10

## 2023-06-22 MED ORDER — HEPARIN SODIUM (PORCINE) 1000 UNIT/ML IJ SOLN
INTRAMUSCULAR | Status: AC | PRN
  Administered 2023-06-22: 3000 [IU] via INTRA_ARTERIAL

## 2023-06-22 MED ORDER — INSULIN ASPART 100 UNIT/ML IJ SOLN: 0.0000 [IU] | Freq: Three times a day (TID) | INTRAMUSCULAR | Status: DC

## 2023-06-22 MED ORDER — MIDAZOLAM HCL 2 MG/2ML IJ SOLN
INTRAMUSCULAR | Status: AC | PRN
  Administered 2023-06-22 (×6): .5 mg via INTRAVENOUS

## 2023-06-22 MED ORDER — ALTEPLASE 2 MG IJ SOLR
4.0000 mg | Freq: Once | INTRAMUSCULAR | Status: AC
  Administered 2023-06-22: 2 mg
  Filled 2023-06-22: qty 4

## 2023-06-22 MED ORDER — ONDANSETRON HCL 4 MG/2ML IJ SOLN
INTRAMUSCULAR | Status: AC | PRN
  Administered 2023-06-22: 4 mg via INTRAVENOUS

## 2023-06-22 NOTE — Procedures (Addendum)
Vascular and Interventional Radiology Procedure Note  Patient: Jeremy Dorsey DOB: [birth year] Medical Record Number: [id number] Note Date/Time: 06/22/23 3:05 PM   Performing Physician: Roanna Banning, MD Assistant(s): None  Diagnosis: ESRD on HD. Malfunctioning LUE access   Procedure:  LEFT UPPER EXTREMITY DIALYSIS CIRCUIT STUDY PHARMACO-MECHANICAL THROMBECTOMY of CLOTTED AVF BALLOON ANGIOPLASTY of VENOUS OUTFLOW STENOSIS REFLUX LEFT UPPER EXTREMITY ARTERIOGRAPHY   Anesthesia: Conscious Sedation Complications: None Estimated Blood Loss: Minimal Specimens: None   Findings:  - thrombosed AVG, pulsatile on examination. Thrombus w minimal flow on POCUS - anterograde access at the LUE AVF - 3K units of Heparin IV total and 4mg  tPA administered - 8 mm PTA of VA stenosis - LUE arteriogram with patent AA - widely patent LUE AVG at the end of the case   Plan: OK for HD tomorrow Remove purse string sutures at HD session  Final report to follow once all images are reviewed and compared with previous studies.  See detailed dictation with images in PACS. The patient tolerated the procedure well without incident or complication and was returned to Recovery in stable condition.    Roanna Banning, MD [facility] Rad[facility]   Pager. [phone] Clinic. [phone]

## 2023-06-22 NOTE — Progress Notes (Signed)
PROGRESS NOTE    Jeremy Dorsey  QIO:[id number] DOB: Jan 23, 1951 DOA: 06/21/2023 PCP: Ronnald Ramp, MD    Brief Narrative:  72 y.o. male well-known to the vascular surgery service having undergone multiple lower extremity arteriograms for PAD, right sided radiocephalic, left-sided brachiocephalic fistulas for end-stage renal disease.  He has been using the left sided brachiocephalic fistula without issue until recently.  Per his wife, the fistula has been very pulsatile, and the cannulae usually used to give blood back is not working appropriately.  Who presents to the ED after no dialysis for the last week.    Assessment and Plan: * AV fistula occlusion [facility]) Nephrology and vascular surgery are on board, concern of thrombosis causing fistula malfunctioning so IR was consulted. -heparin gtt recommended Patient will be n.p.o. after midnight and IR will try to declot  --vascular/renal following as well   ESRD on dialysis [facility]) On home hemodialysis, due Monday, Wednesday and Friday.  Incomplete last 3 sessions due to AV fistula malfunctioning. Elevated BUN and creatinine.  No nausea, vomiting or change in sensorium.  Potassium normal Nephrology is on board.  Essential hypertension -Continue home amlodipine and Coreg.  Diabetes mellitus with nephropathy (HCC) Patient was not on any medications at home. -Monitor CBG  History of CAD (coronary artery disease) S/p PCI with 2 stents placed in 2021. No chest pain. -Holding home aspirin and Plavix for the procedure  Hyperlipidemia LDL goal <70 -Continue home Lipitor           Code Status: Full Code   Disposition Plan:  Level of care: Med-Surg Status is: Observation The patient remains OBS appropriate and will d/c before 2 midnights.    Consultants:  Renal Vascular IR   Subjective: No SOB, no CP-- upset he is NPO  Objective: Vitals:   06/22/23 0414 06/22/23 0644 06/22/23 0819 06/22/23 0943  BP:   125/64 (!) 123/52 (!) 165/104  Pulse:  73 78 68  Resp:  17 20   Temp:  98 F (36.7 C) 98.6 F (37 C)   TempSrc: Oral Oral    SpO2:  95% 96%   Weight:      Height:        Intake/Output Summary (Last 24 hours) at 06/22/2023 1129 Last data filed at 06/22/2023 1000 Gross per 24 hour  Intake 483.56 ml  Output 1300 ml  Net -816.44 ml   Filed Weights   06/21/23 1600  Weight: 103.9 kg    Examination:   General: Appearance:    Obese male in no acute distress     Lungs:     respirations unlabored  Heart:    Normal heart rate.    MS:   All extremities are intact.    Neurologic:   Awake, alert       Data Reviewed: I have personally reviewed following labs and imaging studies  CBC: Recent Labs  Lab 06/21/23 1352 06/21/23 1400 06/21/23 1916 06/22/23 0107  WBC 6.0  --  7.2 7.6  NEUTROABS 3.8  --   --   --   HGB 9.6* 9.2* 10.5* 10.0*  HCT 29.4* 27.0* 31.9* 29.8*  MCV 100.7*  --  96.7 96.4  PLT 140*  --  150 151   Basic Metabolic Panel: Recent Labs  Lab 06/21/23 1352 06/21/23 1400 06/21/23 1916 06/22/23 0107  NA 140 140  --  139  K 4.8 4.8  --  4.5  CL 111 110  --  108  CO2 16*  --   --  15*  GLUCOSE 190* 183*  --  179*  BUN 109* 119*  --  111*  CREATININE 12.23* 12.80* 12.17* 12.16*  CALCIUM 9.5  --   --  9.3   GFR: Estimated Creatinine Clearance: 6.5 mL/min (A) (by C-G formula based on SCr of 12.16 mg/dL (H)). Liver Function Tests: No results for input(s): "AST", "ALT", "ALKPHOS", "BILITOT", "PROT", "ALBUMIN" in the last 168 hours. No results for input(s): "LIPASE", "AMYLASE" in the last 168 hours. No results for input(s): "AMMONIA" in the last 168 hours. Coagulation Profile: No results for input(s): "INR", "PROTIME" in the last 168 hours. Cardiac Enzymes: No results for input(s): "CKTOTAL", "CKMB", "CKMBINDEX", "TROPONINI" in the last 168 hours. BNP (last 3 results) No results for input(s): "PROBNP" in the last 8760 hours. HbA1C: No results for  input(s): "HGBA1C" in the last 72 hours. CBG: Recent Labs  Lab 06/21/23 2000 06/22/23 0727  GLUCAP 180* 131*   Lipid Profile: No results for input(s): "CHOL", "HDL", "LDLCALC", "TRIG", "CHOLHDL", "LDLDIRECT" in the last 72 hours. Thyroid Function Tests: No results for input(s): "TSH", "T4TOTAL", "FREET4", "T3FREE", "THYROIDAB" in the last 72 hours. Anemia Panel: No results for input(s): "VITAMINB12", "FOLATE", "FERRITIN", "TIBC", "IRON", "RETICCTPCT" in the last 72 hours. Sepsis Labs: No results for input(s): "PROCALCITON", "LATICACIDVEN" in the last 168 hours.  No results found for this or any previous visit (from the past 240 hour(s)).       Radiology Studies: VAS US DUPLEX DIALYSIS ACCESS (AVF, AVG)  Result Date: 06/21/2023 DIALYSIS ACCESS Patient Name:  Jeremy Dorsey  Date of Exam:   06/21/2023 Medical Rec #: [id number]         Accession #:    [id number] Date of Birth: [birth year]         Patient Gender: M Patient Age:   72 years Exam Location:  [facility] Procedure:      VAS US DUPLEX DIALYSIS ACCESS (AVF, AVG) Referring Phys: Ivin Booty ROBINS --------------------------------------------------------------------------------  Access Site: Created 07/09/21. Failed left RC AVF. Access Type: Brachial-cephalic AVF. History: Mechanical complication - fistula not working. He has not been able to          do his last 3 dialysis sessions due to difficulity accessing his          fistula. Performing Technologist: Marilynne Halsted RDMS, RVT  Examination Guidelines: A complete evaluation includes B-mode imaging, spectral Doppler, color Doppler, and power Doppler as needed of all accessible portions of each vessel. Unilateral testing is considered an integral part of a complete examination. Limited examinations for reoccurring indications may be performed as noted.  Findings: +---------------+----------+-----------------+--------+ AVF            PSV (cm/s)Flow Vol (mL/min)Comments  +---------------+----------+-----------------+--------+ AVF Anastomosis   176                              +---------------+----------+-----------------+--------+  +------------+----------+-------------+----------+-------------------+ OUTFLOW VEINPSV (cm/s)Diameter (cm)Depth (cm)     Describe       +------------+----------+-------------+----------+-------------------+ Shoulder        22                           partially-occlusive +------------+----------+-------------+----------+-------------------+ Prox UA         69                           partially-occlusive +------------+----------+-------------+----------+-------------------+ Mid UA  25                           partially-occlusive +------------+----------+-------------+----------+-------------------+ Dist UA        104                                               +------------+----------+-------------+----------+-------------------+ AC Fossa       173                                               +------------+----------+-------------+----------+-------------------+   Summary: Arteriovenous fistula-Velocities less than 100cm/s noted. Arteriovenous fistula-Thrombus noted. *See table(s) above for measurements and observations.  Diagnosing physician: Gerarda Fraction Electronically signed by Gerarda Fraction on 06/21/2023 at 4:18:27 PM.   --------------------------------------------------------------------------------   Final         Scheduled Meds:  amLODipine  5 mg Oral q AM   atorvastatin  80 mg Oral Daily   calcitRIOL  0.25 mcg Oral q AM   carvedilol  12.5 mg Oral BID   Chlorhexidine Gluconate Cloth  6 each Topical Q0600   cinacalcet  30 mg Oral Q M,W,F   multivitamin  1 tablet Oral Daily   sevelamer carbonate  800 mg Oral TID with meals   torsemide  100 mg Oral q morning   Continuous Infusions:  heparin 1,700 Units/hr (06/22/23 0339)     LOS: 0 days    Time spent: 45 minutes spent on  chart review, discussion with nursing staff, consultants, updating family and interview/physical exam; more than 50% of that time was spent in counseling and/or coordination of care.    Joseph Art, DO [facility] Available via Epic secure chat 7am-7pm After these hours, please refer to coverage provider listed on [email or web address] 06/22/2023, 11:29 AM

## 2023-06-22 NOTE — TOC CM/SW Note (Signed)
Transition of Care [facility]) - Inpatient Brief Assessment   Patient Details  Name: Jeremy Dorsey MRN: [id number] Date of Birth: November 12, 1950  Transition of Care [facility]) CM/SW Contact:    Jeremy Dorsey, Jeremy Coria, RN Phone Number: 06/22/2023, 11:36 AM   Clinical Narrative:  Patient presented to the ED d/t to Malfunction Fistula.  Patient does Home Hemodialysis on a MWF schedule and wife assists. Home Therapy Clinic is [facility] [address].   Patient states he has not been able to do Dialysis for a week. Labs showed elevated BUN 109 and Creatinine 12.23.   Admitted with AV Fistula Occlusion, Vascular and IR following. Patient started IV Heparin infusion.  Plan for Declotting by IR.   Lives with wife, has three supportive children. Able to drive, has a cane and walker at home.  PCP is  Simmons-Robinson, Tawanna Cooler, MD and uses [facility] on [address] in [place].   No TOC needs or recommendations noted at this time.  Patient not Medically ready for discharge.  CM will continue to follow as patient progresses with care towards discharge.                       Transition of Care Asessment: Insurance and Status: Insurance coverage has been reviewed Patient has primary care physician: Yes Home environment has been reviewed: Yes Prior level of function:: Independent Prior/Current Home Services: No current home services Social Determinants of Health Reivew: SDOH reviewed no interventions necessary Readmission risk has been reviewed: Yes Transition of care needs: no transition of care needs at this time

## 2023-06-22 NOTE — Care Management Obs Status (Signed)
MEDICARE OBSERVATION STATUS NOTIFICATION   Patient Details  Name: Jeremy Dorsey MRN: [id number] Date of Birth: 1950-10-31   Medicare Observation Status Notification Given:  Yes    Tom-Johnson, Hershal Coria, RN 06/22/2023, 2:37 PM

## 2023-06-22 NOTE — Progress Notes (Signed)
PHARMACY - ANTICOAGULATION CONSULT NOTE  Pharmacy Consult for heparin infusion Indication:  clots in AV fistula  Allergies  Allergen Reactions   Oxycodone Nausea And Vomiting   Hydrocodone Nausea And Vomiting    Patient Measurements: Height: 5\' 10"  (177.8 cm) Weight: 103.9 kg (229 lb 0.9 oz) IBW/kg (Calculated) : 73 Heparin Dosing Weight: 95 kg  Vital Signs: Temp: 98.6 F (37 C) (11/18 1900) BP: 167/107 (11/18 1900) Pulse Rate: 70 (11/18 1900)  Labs: Recent Labs    06/21/23 1352 06/21/23 1400 06/21/23 1916 06/22/23 0107  HGB 9.6* 9.2* 10.5* 10.0*  HCT 29.4* 27.0* 31.9* 29.8*  PLT 140*  --  150 151  HEPARINUNFRC  --   --   --  0.25*  CREATININE 12.23* 12.80* 12.17*  --     Estimated Creatinine Clearance: 6.5 mL/min (A) (by C-G formula based on SCr of 12.17 mg/dL (H)).   Medical History: Past Medical History:  Diagnosis Date   Anemia    Cancer (HCC)    basal cell nose and forehead   Cataract 2016   Cataract surgery bith eyes   CKD (chronic kidney disease), stage IV (HCC)    Coronary artery disease    a. s/p IVUS-guided DESx2 to prox & mid LAD, residual disease treated medically. EF 50-55% by recent echo 02/2020.   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 Currently anemic with Hgb 9.2, no bleeding noted on provider exams. Heparin gtt recommended by vascular to prevent thrombus propagation.  11/19 AM update:  Heparin level sub-therapeutic   Goal of Therapy:  Heparin level 0.3-0.7 units/ml Monitor platelets by anticoagulation protocol: Yes   Plan:  Inc heparin to 1700 units/hr Re-check heparin level in 8 hours  Abran Duke, PharmD,  BCPS Clinical Pharmacist Phone: [phone]

## 2023-06-22 NOTE — Progress Notes (Addendum)
Chief Complaint: Patient was seen in consultation today for AVF declot at the request   Referring Physician(s): Dr. Arnetha Courser Dr. Karin Lieu  Supervising Physician: Roanna Banning  Patient Status: [facility] - In-pt  History of Present Illness: Jeremy Dorsey is a 72 y.o. male with ESRD on HD. Had a left UE AVF created in 2022. Has had multiple recent interventions and typically goes to the outpt [facility]. Has had recent dysfunction with the catheter, had limited treatments last week. Went for HD yesterday and they could not start HD due to pulling clots. Outpt center was apparently not able to get pt scheduled till later this week, so pt was sent to ER for admission and inpt treatment. Vascular surgery was consulted, US duplex performed shows thrombus in the AVF. Deferral of treatment to IR.  IR consulted for fistulogram/thrombectomy. Pt NPO Wife at bedside. Pt feels okay at the moment.  Past Medical History:  Diagnosis Date   Anemia    Cancer (HCC)    basal cell nose and forehead   Cataract 2016   Cataract surgery bith eyes   CKD (chronic kidney disease), stage IV (HCC)    Coronary artery disease    a. s/p IVUS-guided DESx2 to prox & mid LAD, residual disease treated medically. EF 50-55% by recent echo 02/2020.   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   Urinary complication     Past Surgical History:  Procedure Laterality Date   ABDOMINAL AORTOGRAM W/LOWER EXTREMITY N/A 01/29/2022   Procedure: ABDOMINAL AORTOGRAM W/ Bilateral LOWER EXTREMITY [place];  Surgeon: Cephus Shelling, MD;  Location: [facility];  Service: Cardiovascular;  Laterality: N/A;   ABDOMINAL AORTOGRAM W/LOWER EXTREMITY N/A 06/11/2022   Procedure: ABDOMINAL AORTOGRAM W/LOWER EXTREMITY;  Surgeon: Cephus Shelling, MD;   Location: [facility];  Service: Cardiovascular;  Laterality: N/A;   AV FISTULA PLACEMENT Left 10/31/2020   Procedure: LEFT UPPER EXTREMITY ARTERIOVENOUS (AV) FISTULA CREATION;  Surgeon: Maeola Harman, MD;  Location: [facility] OR;  Service: Vascular;  Laterality: Left;   AV FISTULA PLACEMENT Left 07/09/2021   Procedure: LEFT ARM ARTERIOVENOUS (AV) FISTULA CREATION;  Surgeon: Nada Libman, MD;  Location: [facility] OR;  Service: Vascular;  Laterality: Left;   COLONOSCOPY  1990   COLONOSCOPY  06/09/2011   Dr Lemar Livings   CORONARY STENT INTERVENTION N/A 04/24/2020   Procedure: CORONARY STENT INTERVENTION;  Surgeon: Iran Ouch, MD;  [facility]: [facility];  Service: Cardiovascular;  Laterality: N/A;   CORONARY ULTRASOUND/IVUS N/A 04/24/2020   Procedure: Intravascular Ultrasound/IVUS;  Surgeon: Iran Ouch, MD;  [facility]: [facility];  Service: Cardiovascular;  Laterality: N/A;   CYSTOSCOPY W/ RETROGRADES Bilateral 08/01/2018   Procedure: CYSTOSCOPY WITH RETROGRADE PYELOGRAM;  Surgeon: Sondra Come, MD;  Location: [facility] ORS;  Service: Urology;  Laterality: Bilateral;   CYSTOSCOPY WITH BIOPSY N/A 08/01/2018   Procedure: CYSTOSCOPY WITH Bladder BIOPSY;  Surgeon: Sondra Come, MD;  Location: [facility] OR[facility];  Service: Urology;  Laterality: N/A;   CYSTOSCOPY WITH STENT PLACEMENT Right 08/01/2018   Procedure: CYSTOSCOPY WITH STENT PLACEMENT;  Surgeon: Sondra Come, MD;  Location: [facility] OR[facility];  Service: Urology;  Laterality: Right;   EYE SURGERY Right    laser surgery  FRACTURE SURGERY     Rt shoulder rotaor cuff   LEFT HEART CATH AND CORONARY ANGIOGRAPHY N/A 04/24/2020   Procedure: LEFT HEART CATH AND CORONARY ANGIOGRAPHY;  Surgeon: Iran Ouch, MD;  [facility];  Service: Cardiovascular;  Laterality: N/A;   LEFT HEART CATH AND CORONARY ANGIOGRAPHY Left 04/16/2023   Procedure: LEFT HEART CATH AND CORONARY ANGIOGRAPHY;  Surgeon: Iran Ouch,  MD;  Location: [facility];  Service: Cardiovascular;  Laterality: Left;   PERIPHERAL VASCULAR BALLOON ANGIOPLASTY Right 01/29/2022   Procedure: PERIPHERAL VASCULAR BALLOON ANGIOPLASTY;  Surgeon: Cephus Shelling, MD;  [facility];  Service: Cardiovascular;  Laterality: Right;   PERIPHERAL VASCULAR BALLOON ANGIOPLASTY Right 06/11/2022   Procedure: PERIPHERAL VASCULAR BALLOON ANGIOPLASTY;  Surgeon: Cephus Shelling, MD;  [facility]: [facility];  Service: Cardiovascular;  Laterality: Right;  Peroneal   SHOULDER ARTHROSCOPY WITH OPEN ROTATOR CUFF REPAIR Right 08/25/2018   Procedure: SHOULDER ARTHROSCOPY WITH MINI OPEN ROTATOR CUFF REPAIR;  Surgeon: Juanell Fairly, MD;  Location: [facility] OR[facility];  Service: Orthopedics;  Laterality: Right;   TRANSURETHRAL RESECTION OF BLADDER TUMOR N/A 08/01/2018   Procedure: TRANSURETHRAL RESECTION OF BLADDER TUMOR (TURBT);  Surgeon: Sondra Come, MD;  Location: [facility];  Service: Urology;  Laterality: N/A;   URETEROSCOPY WITH HOLMIUM LASER LITHOTRIPSY Right 08/01/2018   Procedure: URETEROSCOPY WITH HOLMIUM LASER LITHOTRIPSY;  Surgeon: Sondra Come, MD;  Location: [facility];  Service: Urology;  Laterality: Right;    Allergies: Oxycodone and Hydrocodone  Medications: Prior to Admission medications   Medication Sig Start Date End Date Taking? Authorizing Provider  acetaminophen (TYLENOL) 500 MG tablet Take 1,500-2,000 mg by mouth every 6 (six) hours as needed for mild pain (pain score 1-3) or moderate pain (pain score 4-6).   Yes [provider]  amLODipine (NORVASC) 5 MG tablet Take 5 mg by mouth in the morning.   Yes [provider]  aspirin EC (ASPIRIN LOW DOSE) 81 MG tablet TAKE 1 TABLET BY MOUTH EVERY MORNING (SWALLOW WHOLE) Patient taking differently: Take 81 mg by mouth daily. 04/13/23  Yes Antonieta Iba, MD  atorvastatin (LIPITOR) 80 MG tablet TAKE 1 TABLET BY MOUTH DAILY 04/13/23  Yes Gollan,  Tollie Pizza, MD  AURYXIA 1 GM 210 MG(Fe) tablet Take by mouth. 04/22/22  Yes [provider]  calcitRIOL (ROCALTROL) 0.25 MCG capsule Take 0.25 mcg by mouth in the morning.   Yes [provider]  carvedilol (COREG) 12.5 MG tablet Take 1 tablet by mouth 2 (two) times daily. 08/24/22  Yes [provider]  cinacalcet (SENSIPAR) 30 MG tablet Take 30 mg by mouth every Monday, Wednesday, and Friday.   Yes [provider]  clopidogrel (PLAVIX) 75 MG tablet TAKE 1 TABLET BY MOUTH DAILY 04/13/23  Yes Antonieta Iba, MD  lidocaine-prilocaine (EMLA) cream Apply 1 Application topically every Monday, Wednesday, and Friday. 01/07/22  Yes [provider]  Methoxy PEG-Epoetin Beta (MIRCERA IJ) Inject 1 each into the skin See admin instructions. During Dialysis 04/26/23  Yes [provider]  multivitamin (RENA-VIT) TABS tablet Take 1 tablet by mouth daily.   Yes [provider]  mupirocin cream (BACTROBAN) 2 % Apply 1 Application topically daily. 03/25/23  Yes [provider]  nitroGLYCERIN (NITROSTAT) 0.4 MG SL tablet Place 1 tablet (0.4 mg total) under the tongue every 5 (five) minutes x 3 doses as needed for chest pain. 03/15/23  Yes Antonieta Iba, MD  sevelamer carbonate (RENVELA) 800 MG tablet Take 800 mg by mouth with breakfast, with lunch, and with evening meal. 12/22/21  Yes [provider]  sildenafil (VIAGRA) 100 MG tablet Take 100 mg by mouth as needed for erectile dysfunction. 03/30/22  Yes [provider]  torsemide (DEMADEX) 100 MG tablet Take 100 mg by mouth every morning. 01/20/22  Yes [provider]     Family History  Problem Relation Age of Onset   Psoriasis Mother    Heart failure Father     Social History   Socioeconomic History   Marital status: Married    Spouse name: Not on file   Number of children: 3   Years of education: Not on file   Highest education level: Bachelor's degree (e.g.,  BA, AB, BS)  Occupational History    Comment: retired  Tobacco Use   Smoking status: Former    Current packs/day: 0.00    Average packs/day: 1 pack/day for 30.0 years (30.0 ttl pk-yrs)    Types: Cigarettes    Start date: 08/22/1977    Quit date: 08/23/2007    Years since quitting: 15.8    Passive exposure: Past   Smokeless tobacco: Never  Vaping Use   Vaping status: Never Used  Substance and Sexual Activity   Alcohol use: No   Drug use: No   Sexual activity: Not Currently    Birth control/protection: None  Other Topics Concern   Not on file  Social History Narrative   Not on file   Social Determinants of Health   Financial Resource Strain: Patient Declined (03/02/2023)   Overall Financial Resource Strain (CARDIA)    Difficulty of Paying Living Expenses: Patient declined  Food Insecurity: No Food Insecurity (06/21/2023)   Hunger Vital Sign    Worried About Running Out of Food in the Last Year: Never true    Ran Out of Food in the Last Year: Never true  Transportation Needs: No Transportation Needs (06/21/2023)   PRAPARE - Administrator, Civil Service (Medical): No    Lack of Transportation (Non-Medical): No  Physical Activity: Patient Declined (03/02/2023)   Exercise Vital Sign    Days of Exercise per Week: Patient declined    Minutes of Exercise per Session: Patient declined  Stress: Patient Declined (03/02/2023)   [employer] of Occupational Health - Occupational Stress Questionnaire    Feeling of Stress : Patient declined  Social Connections: Unknown (03/02/2023)   Social Connection and Isolation Panel [NHANES]    Frequency of Communication with Friends and Family: Patient declined    Frequency of Social Gatherings with Friends and Family: Patient declined    Attends Religious Services: Not on Marketing executive or Organizations: Patient declined    Attends Banker Meetings: Patient declined    Marital Status: Patient declined     Review of Systems: A 12 point ROS discussed and pertinent positives are indicated in the HPI above.  All other systems are negative.  Review of Systems  Vital Signs: BP (!) 165/104 (BP Location: Left Leg)   Pulse 68   Temp 98.6 F (37 C)   Resp 20   Ht 5\' 10"  (1.778 m)   Wt 229 lb 0.9 oz (103.9 kg)   SpO2 96%   BMI 32.87 kg/m     Physical Exam Constitutional:      Appearance: Normal appearance.  HENT:     Mouth/Throat:     Mouth: Mucous  membranes are moist.     Pharynx: Oropharynx is clear.  Cardiovascular:     Rate and Rhythm: Normal rate and regular rhythm.     Heart sounds: Normal heart sounds.  Pulmonary:     Effort: Pulmonary effort is normal. No respiratory distress.     Breath sounds: Normal breath sounds.  Musculoskeletal:     Comments: (L)UE AVF still with palpable pulse, non tender Hand warm, good radial pulse  Neurological:     General: No focal deficit present.     Mental Status: He is alert and oriented to person, place, and time.  Psychiatric:        Mood and Affect: Mood normal.        Thought Content: Thought content normal.     Imaging: VAS US DUPLEX DIALYSIS ACCESS (AVF, AVG)  Result Date: 06/21/2023 DIALYSIS ACCESS Patient Name:  DEUNTE CRITCHER  Date of Exam:   06/21/2023 Medical Rec #: [id number]         Accession #:    [id number] Date of Birth: Jul 15, 1951         Patient Gender: M Patient Age:   48 years Exam Location:  [facility] Procedure:      VAS US DUPLEX DIALYSIS ACCESS (AVF, AVG) Referring Phys: Ivin Booty ROBINS --------------------------------------------------------------------------------  Access Site: Created 07/09/21. Failed left RC AVF. Access Type: Brachial-cephalic AVF. History: Mechanical complication - fistula not working. He has not been able to          do his last 3 dialysis sessions due to difficulity accessing his          fistula. Performing Technologist: Marilynne Halsted RDMS, RVT  Examination Guidelines: A complete  evaluation includes B-mode imaging, spectral Doppler, color Doppler, and power Doppler as needed of all accessible portions of each vessel. Unilateral testing is considered an integral part of a complete examination. Limited examinations for reoccurring indications may be performed as noted.  Findings: +---------------+----------+-----------------+--------+ AVF            PSV (cm/s)Flow Vol (mL/min)Comments +---------------+----------+-----------------+--------+ AVF Anastomosis   176                              +---------------+----------+-----------------+--------+  +------------+----------+-------------+----------+-------------------+ OUTFLOW VEINPSV (cm/s)Diameter (cm)Depth (cm)     Describe       +------------+----------+-------------+----------+-------------------+ Shoulder        22                           partially-occlusive +------------+----------+-------------+----------+-------------------+ Prox UA         69                           partially-occlusive +------------+----------+-------------+----------+-------------------+ Mid UA          25                           partially-occlusive +------------+----------+-------------+----------+-------------------+ Dist UA        104                                               +------------+----------+-------------+----------+-------------------+ AC Fossa       173                                               +------------+----------+-------------+----------+-------------------+  Summary: Arteriovenous fistula-Velocities less than 100cm/s noted. Arteriovenous fistula-Thrombus noted. *See table(s) above for measurements and observations.  Diagnosing physician: Gerarda Fraction Electronically signed by Gerarda Fraction on 06/21/2023 at 4:18:27 PM.   --------------------------------------------------------------------------------   Final    MR CARDIAC MORPHOLOGY W WO CONTRAST  Result Date: 06/02/2023 CLINICAL DATA:   Hx of CAD/NSTEMI, evaluate for infiltrative disease EXAM: CARDIAC MRI TECHNIQUE: The patient was scanned on a 1.5 Tesla Siemens magnet. A dedicated cardiac coil was used. Functional imaging was done using Fiesta sequences. 2,3, and 4 chamber views were done to assess for RWMA's. Modified Simpson's rule using a short axis stack was used to calculate an ejection fraction on a dedicated work Research officer, trade union. The patient received 13 cc of Gadavist. 10 minutes post contrast inversion recovery sequences were performed. Velocity flow mapping performed in the ascending aorta and main pulmonary artery. CONTRAST:  13 cc  of Gadavist FINDINGS: 1. Normal left ventricular size, mild Lv wall thickness, normal systolic function (LVEF = 57%). There are no regional wall motion abnormalities. There is diffuse midwall late gadolinium enhancement in the basal left ventricular myocardium. LVEDV: 192 ml LVESV: 82 ml SV: 110 ml CO: 5.7 L/min Myocardial mass: 207 g LV native T1 map 1066 ms. (Normal < 1000 ms) 2. Normal right ventricular size, thickness and systolic function (RVEF = 60). There are no regional wall motion abnormalities. 3.  Normal left and right atrial size. 4. Normal size of the aortic root, ascending aorta and pulmonary artery. 5.  Tricuspid aortic valve.  no significant valvular abnormalities. 6.  Normal pericardium.  No pericardial effusion. IMPRESSION: 1.  Normal LV size and systolic function.  LVEF 57%. 2.  Diffuse midwall basal LGE/scar. 3. ECV not calculated due to exam ending prematurely as per patient request. 4.  Normal RV size and function. 5. Consider clinical workup for amyloidosis due to diffuse basal LGE (apical sparing) Electronically Signed   By: Debbe Odea M.D.   On: 06/02/2023 15:25   MR CARDIAC VELOCITY FLOW MAP  Result Date: 06/02/2023 CLINICAL DATA:  Hx of CAD/NSTEMI, evaluate for infiltrative disease EXAM: CARDIAC MRI TECHNIQUE: The patient was scanned on a 1.5 Tesla  Siemens magnet. A dedicated cardiac coil was used. Functional imaging was done using Fiesta sequences. 2,3, and 4 chamber views were done to assess for RWMA's. Modified Simpson's rule using a short axis stack was used to calculate an ejection fraction on a dedicated work Research officer, trade union. The patient received 13 cc of Gadavist. 10 minutes post contrast inversion recovery sequences were performed. Velocity flow mapping performed in the ascending aorta and main pulmonary artery. CONTRAST:  13 cc  of Gadavist FINDINGS: 1. Normal left ventricular size, mild Lv wall thickness, normal systolic function (LVEF = 57%). There are no regional wall motion abnormalities. There is diffuse midwall late gadolinium enhancement in the basal left ventricular myocardium. LVEDV: 192 ml LVESV: 82 ml SV: 110 ml CO: 5.7 L/min Myocardial mass: 207 g LV native T1 map 1066 ms. (Normal < 1000 ms) 2. Normal right ventricular size, thickness and systolic function (RVEF = 60). There are no regional wall motion abnormalities. 3.  Normal left and right atrial size. 4. Normal size of the aortic root, ascending aorta and pulmonary artery. 5.  Tricuspid aortic valve.  no significant valvular abnormalities. 6.  Normal pericardium.  No pericardial effusion. IMPRESSION: 1.  Normal LV size and systolic function.  LVEF 57%. 2.  Diffuse midwall basal LGE/scar. 3. ECV not  calculated due to exam ending prematurely as per patient request. 4.  Normal RV size and function. 5. Consider clinical workup for amyloidosis due to diffuse basal LGE (apical sparing) Electronically Signed   By: Debbe Odea M.D.   On: 06/02/2023 15:25   MR CARDIAC VELOCITY FLOW MAP  Result Date: 06/02/2023 CLINICAL DATA:  Hx of CAD/NSTEMI, evaluate for infiltrative disease EXAM: CARDIAC MRI TECHNIQUE: The patient was scanned on a 1.5 Tesla Siemens magnet. A dedicated cardiac coil was used. Functional imaging was done using Fiesta sequences. 2,3, and 4 chamber views  were done to assess for RWMA's. Modified Simpson's rule using a short axis stack was used to calculate an ejection fraction on a dedicated work Research officer, trade union. The patient received 13 cc of Gadavist. 10 minutes post contrast inversion recovery sequences were performed. Velocity flow mapping performed in the ascending aorta and main pulmonary artery. CONTRAST:  13 cc  of Gadavist FINDINGS: 1. Normal left ventricular size, mild Lv wall thickness, normal systolic function (LVEF = 57%). There are no regional wall motion abnormalities. There is diffuse midwall late gadolinium enhancement in the basal left ventricular myocardium. LVEDV: 192 ml LVESV: 82 ml SV: 110 ml CO: 5.7 L/min Myocardial mass: 207 g LV native T1 map 1066 ms. (Normal < 1000 ms) 2. Normal right ventricular size, thickness and systolic function (RVEF = 60). There are no regional wall motion abnormalities. 3.  Normal left and right atrial size. 4. Normal size of the aortic root, ascending aorta and pulmonary artery. 5.  Tricuspid aortic valve.  no significant valvular abnormalities. 6.  Normal pericardium.  No pericardial effusion. IMPRESSION: 1.  Normal LV size and systolic function.  LVEF 57%. 2.  Diffuse midwall basal LGE/scar. 3. ECV not calculated due to exam ending prematurely as per patient request. 4.  Normal RV size and function. 5. Consider clinical workup for amyloidosis due to diffuse basal LGE (apical sparing) Electronically Signed   By: Debbe Odea M.D.   On: 06/02/2023 15:25    Labs:  CBC: Recent Labs    04/30/23 1210 06/21/23 1352 06/21/23 1400 06/21/23 1916 06/22/23 0107  WBC 6.6 6.0  --  7.2 7.6  HGB 10.7* 9.6* 9.2* 10.5* 10.0*  HCT 32.5* 29.4* 27.0* 31.9* 29.8*  PLT 162 140*  --  150 151    COAGS: No results for input(s): "INR", "APTT" in the last 8760 hours.  BMP: Recent Labs    12/23/22 1125 12/23/22 1125 04/12/23 1034 04/30/23 1210 06/21/23 1352 06/21/23 1400 06/21/23 1916  06/22/23 0107  NA 136   < > 139 140 140 140  --  139  K 3.8  --  4.7 4.0 4.8 4.8  --  4.5  CL 100  --  98 95* 111 110  --  108  CO2 23  --  23 25 16*  --   --  15*  GLUCOSE 262*   < > 172* 148* 190* 183*  --  179*  BUN 61*   < > 66* 51* 109* 119*  --  111*  CALCIUM 9.7  --  9.7 9.6 9.5  --   --  9.3  CREATININE 8.07*  --  8.69* 8.36* 12.23* 12.80* 12.17* 12.16*  GFRNONAA 7*  --   --   --  4*  --  4* 4*   < > = values in this interval not displayed.    LIVER FUNCTION TESTS: Recent Labs    04/12/23 1034 04/30/23 1210 06/11/23  1222  BILITOT 0.5 0.3  --   AST 11 19  --   ALT 18 24  --   ALKPHOS 109 110  --   PROT 6.7 6.6 6.4  ALBUMIN 4.5 4.5  --     TUMOR MARKERS: No results for input(s): "AFPTM", "CEA", "CA199", "CHROMGRNA" in the last 8760 hours.  Assessment and Plan: LUE AVF with thrombus, not occlusive. Pt NPO Risks and benefits of fistulogram/thrombectomy discussed with the patient including, but not limited to bleeding, infection, vascular injury, pulmonary embolism, need for tunneled HD catheter placement or even death.  All of the patient's questions were answered, patient is agreeable to proceed. Consent signed and in chart.   Thank you for this interesting consult.  I greatly enjoyed meeting GRAYER SZAFRAN and look forward to participating in their care.  A copy of this report was sent to the requesting provider on this date.  Electronically Signed: Brayton El, PA-C 06/22/2023, 12:20 PM   I spent a total of 30 minutes in face to face in clinical consultation, greater than 50% of which was counseling/coordinating care for AVFistulogram

## 2023-06-22 NOTE — Progress Notes (Signed)
[facility] Progress Note   Subjective: Seen in room. Says he doesn't feel well, hasn't had full treatment in 3 days. Hopefully going for declot in IR later today. Remains NPO.   Objective Vitals:   06/22/23 0414 06/22/23 0644 06/22/23 0819 06/22/23 0943  BP:  125/64 (!) 123/52 (!) 165/104  Pulse:  73 78 68  Resp:  17 20   Temp:  98 F (36.7 C) 98.6 F (37 C)   TempSrc: Oral Oral    SpO2:  95% 96%   Weight:      Height:       Physical Exam General: Pleasant older male in NAD Heart: S1,S2 No M/R/G.  Lungs: CTAB Abdomen: NABS, NT Extremities: No edema Dialysis Access: L AVF faint bruit, pulsatile   Additional Objective Labs: Basic Metabolic Panel: Recent Labs  Lab 06/21/23 1352 06/21/23 1400 06/21/23 1916 06/22/23 0107  NA 140 140  --  139  K 4.8 4.8  --  4.5  CL 111 110  --  108  CO2 16*  --   --  15*  GLUCOSE 190* 183*  --  179*  BUN 109* 119*  --  111*  CREATININE 12.23* 12.80* 12.17* 12.16*  CALCIUM 9.5  --   --  9.3   Liver Function Tests: No results for input(s): "AST", "ALT", "ALKPHOS", "BILITOT", "PROT", "ALBUMIN" in the last 168 hours. No results for input(s): "LIPASE", "AMYLASE" in the last 168 hours. CBC: Recent Labs  Lab 06/21/23 1352 06/21/23 1400 06/21/23 1916 06/22/23 0107  WBC 6.0  --  7.2 7.6  NEUTROABS 3.8  --   --   --   HGB 9.6* 9.2* 10.5* 10.0*  HCT 29.4* 27.0* 31.9* 29.8*  MCV 100.7*  --  96.7 96.4  PLT 140*  --  150 151   Blood Culture No results found for: "SDES", "SPECREQUEST", "CULT", "REPTSTATUS"  Cardiac Enzymes: No results for input(s): "CKTOTAL", "CKMB", "CKMBINDEX", "TROPONINI" in the last 168 hours. CBG: Recent Labs  Lab 06/21/23 2000 06/22/23 0727  GLUCAP 180* 131*   Iron Studies: No results for input(s): "IRON", "TIBC", "TRANSFERRIN", "FERRITIN" in the last 72 hours. @lablastinr3 @ Studies/Results: VAS US DUPLEX DIALYSIS ACCESS (AVF, AVG)  Result Date: 06/21/2023 DIALYSIS ACCESS Patient  Name:  Jeremy Dorsey  Date of Exam:   06/21/2023 Medical Rec #: [id number]         Accession #:    [id number] Date of Birth: 10-24-1950         Patient Gender: M Patient Age:   72 years Exam Location:  [facility] Procedure:      VAS US DUPLEX DIALYSIS ACCESS (AVF, AVG) Referring Phys: Ivin Booty ROBINS --------------------------------------------------------------------------------  Access Site: Created 07/09/21. Failed left RC AVF. Access Type: Brachial-cephalic AVF. History: Mechanical complication - fistula not working. He has not been able to          do his last 3 dialysis sessions due to difficulity accessing his          fistula. Performing Technologist: Marilynne Halsted RDMS, RVT  Examination Guidelines: A complete evaluation includes B-mode imaging, spectral Doppler, color Doppler, and power Doppler as needed of all accessible portions of each vessel. Unilateral testing is considered an integral part of a complete examination. Limited examinations for reoccurring indications may be performed as noted.  Findings: +---------------+----------+-----------------+--------+ AVF            PSV (cm/s)Flow Vol (mL/min)Comments +---------------+----------+-----------------+--------+ AVF Anastomosis   176                              +---------------+----------+-----------------+--------+  +------------+----------+-------------+----------+-------------------+  OUTFLOW VEINPSV (cm/s)Diameter (cm)Depth (cm)     Describe       +------------+----------+-------------+----------+-------------------+ Shoulder        22                           partially-occlusive +------------+----------+-------------+----------+-------------------+ Prox UA         69                           partially-occlusive +------------+----------+-------------+----------+-------------------+ Mid UA          25                           partially-occlusive  +------------+----------+-------------+----------+-------------------+ Dist UA        104                                               +------------+----------+-------------+----------+-------------------+ AC Fossa       173                                               +------------+----------+-------------+----------+-------------------+   Summary: Arteriovenous fistula-Velocities less than 100cm/s noted. Arteriovenous fistula-Thrombus noted. *See table(s) above for measurements and observations.  Diagnosing physician: Gerarda Fraction Electronically signed by Gerarda Fraction on 06/21/2023 at 4:18:27 PM.   --------------------------------------------------------------------------------   Final    Medications:  heparin 1,700 Units/hr (06/22/23 0339)    amLODipine  5 mg Oral q AM   atorvastatin  80 mg Oral Daily   calcitRIOL  0.25 mcg Oral q AM   carvedilol  12.5 mg Oral BID   Chlorhexidine Gluconate Cloth  6 each Topical Q0600   cinacalcet  30 mg Oral Q M,W,F   multivitamin  1 tablet Oral Daily   sevelamer carbonate  800 mg Oral TID with meals   torsemide  100 mg Oral q morning     OP HD: home HD , Dr Marisue Humble 3x/ week  4h  450 bfr  102kg   AVF   Heparin 5000  - last HD 11/15, just 50 min - HD 11/13 was just 1h long - dry wt lowered recently104 --> 102kg     Assessment/ Plan: Malfunctioning AV fistula - has had 3-4 procedures to "open up a blockage" in the past 1.5 yrs on dialysis. Is on home HD. IR consulted, hopefully declot today. Follow K+ levels.  ESKD - on HD MWF at home. Will be available for any inpatient HD that is needed.  HTN - BP's okay, cont home meds Volume - no vol excess on exam. Get wts.  Anemia of eskd - Hb 9-10 here. Follow.   Chirsty Armistead H. Shanika Levings NP-C 06/22/2023, 12:11 PM  [employer] [phone]

## 2023-06-22 NOTE — Progress Notes (Signed)
PHARMACY - ANTICOAGULATION CONSULT NOTE  Pharmacy Consult for heparin infusion Indication:  clots in AV fistula  Allergies  Allergen Reactions   Oxycodone Nausea And Vomiting   Hydrocodone Nausea And Vomiting    Patient Measurements: Height: 5\' 10"  (177.8 cm) Weight: 103.9 kg (229 lb 0.9 oz) IBW/kg (Calculated) : 73 Heparin Dosing Weight: 95 kg  Vital Signs: Temp: 98.6 F (37 C) (11/19 0819) Temp Source: Oral (11/19 0644) BP: 165/104 (11/19 0943) Pulse Rate: 68 (11/19 0943)  Labs: Recent Labs    06/21/23 1352 06/21/23 1400 06/21/23 1916 06/22/23 0107 06/22/23 1016  HGB 9.6* 9.2* 10.5* 10.0*  --   HCT 29.4* 27.0* 31.9* 29.8*  --   PLT 140*  --  150 151  --   HEPARINUNFRC  --   --   --  0.25* 0.25*  CREATININE 12.23* 12.80* 12.17* 12.16*  --     Estimated Creatinine Clearance: 6.5 mL/min (A) (by C-G formula based on SCr of 12.16 mg/dL (H)).   Medical History: Past Medical History:  Diagnosis Date   Anemia    Cancer (HCC)    basal cell nose and forehead   Cataract 2016   Cataract surgery bith eyes   CKD (chronic kidney disease), stage IV (HCC)    Coronary artery disease    a. s/p IVUS-guided DESx2 to prox & mid LAD, residual disease treated medically. EF 50-55% by recent echo 02/2020.   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 Currently anemic with Hgb 9.2, no bleeding noted on provider exams. Heparin gtt recommended by vascular to prevent thrombus propagation.  11/19 AM update:   Heparin level  remains at 0.25, sub-therapeutic  despite heparin rate increased to 1700 units/hr.  CBC stable.  No issues with heparin infusion reported. No bleeding  noted.   Goal of Therapy:  Heparin level 0.3-0.7 units/ml Monitor platelets by anticoagulation protocol: Yes   Plan:  Increase heparin to 1900 units/hr Re-check heparin level in 8 hours Daily heparin level and CBC.  F/u post IR procedure to declot 06/23/23.   Noah Delaine, RPh Clinical Pharmacist Please check AMION for all [facility] Pharmacy phone numbers After 10:00 PM, call Main Pharmacy [phone]

## 2023-06-23 ENCOUNTER — Telehealth: Payer: Self-pay | Admitting: *Deleted

## 2023-06-23 ENCOUNTER — Other Ambulatory Visit: Payer: Self-pay | Admitting: Cardiovascular Disease

## 2023-06-23 DIAGNOSIS — T82898D Other specified complication of vascular prosthetic devices, implants and grafts, subsequent encounter: Secondary | ICD-10-CM | POA: Diagnosis not present

## 2023-06-23 DIAGNOSIS — E785 Hyperlipidemia, unspecified: Secondary | ICD-10-CM

## 2023-06-23 DIAGNOSIS — I25118 Atherosclerotic heart disease of native coronary artery with other forms of angina pectoris: Secondary | ICD-10-CM

## 2023-06-23 LAB — CBC
HCT: 30.1 % — ABNORMAL LOW (ref 39.0–52.0)
Hemoglobin: 10.3 g/dL — ABNORMAL LOW (ref 13.0–17.0)
MCH: 32.1 pg (ref 26.0–34.0)
MCHC: 34.2 g/dL (ref 30.0–36.0)
MCV: 93.8 fL (ref 80.0–100.0)
Platelets: 148 10*3/uL — ABNORMAL LOW (ref 150–400)
RBC: 3.21 MIL/uL — ABNORMAL LOW (ref 4.22–5.81)
RDW: 12.9 % (ref 11.5–15.5)
WBC: 8.1 10*3/uL (ref 4.0–10.5)
nRBC: 0 % (ref 0.0–0.2)

## 2023-06-23 LAB — MRSA NEXT GEN BY PCR, NASAL: MRSA by PCR Next Gen: NOT DETECTED

## 2023-06-23 LAB — GLUCOSE, CAPILLARY: Glucose-Capillary: 151 mg/dL — ABNORMAL HIGH (ref 70–99)

## 2023-06-23 LAB — HEPATITIS B SURFACE ANTIBODY, QUANTITATIVE: Hep B S AB Quant (Post): 513 m[IU]/mL

## 2023-06-23 LAB — HEPARIN LEVEL (UNFRACTIONATED): Heparin Unfractionated: 0.33 [IU]/mL (ref 0.30–0.70)

## 2023-06-23 MED ORDER — SENNOSIDES-DOCUSATE SODIUM 8.6-50 MG PO TABS: 1.0000 | ORAL_TABLET | Freq: Every evening | ORAL | Status: DC | PRN

## 2023-06-23 MED ORDER — HYDRALAZINE HCL 20 MG/ML IJ SOLN: 10.0000 mg | INTRAMUSCULAR | Status: DC | PRN

## 2023-06-23 MED ORDER — TRAZODONE HCL 50 MG PO TABS: 50.0000 mg | ORAL_TABLET | Freq: Every evening | ORAL | Status: DC | PRN

## 2023-06-23 MED ORDER — METOPROLOL TARTRATE 5 MG/5ML IV SOLN: 5.0000 mg | INTRAVENOUS | Status: DC | PRN

## 2023-06-23 MED ORDER — IPRATROPIUM-ALBUTEROL 0.5-2.5 (3) MG/3ML IN SOLN: 3.0000 mL | RESPIRATORY_TRACT | Status: DC | PRN

## 2023-06-23 MED ORDER — GUAIFENESIN 100 MG/5ML PO LIQD: 5.0000 mL | ORAL | Status: DC | PRN

## 2023-06-23 NOTE — TOC Transition Note (Signed)
Transition of Care [facility]) - CM/SW Discharge Note   Patient Details  Name: Jeremy Dorsey MRN: [id number] Date of Birth: 1950/08/16  Transition of Care [facility]) CM/SW Contact:  Tom-Johnson, Hershal Coria, RN Phone Number: 06/23/2023, 11:16 AM   Clinical Narrative:     Patient is scheduled for discharge today.  Outpatient f/u, hospital f/u and discharge instructions on AVS. No TOC needs or recommendations noted. Wife, Rinaldo Cloud to transport at discharge.  No further TOC needs noted.           Final next level of care: Home/Self Care Barriers to Discharge: Barriers Resolved   Patient Goals and CMS Choice CMS Medicare.gov Compare Post Acute Care list provided to:: Patient Choice offered to / list presented to : NA  Discharge Placement                  Patient to be transferred to facility by: Wife Name of family member notified: Rinaldo Cloud    Discharge Plan and Services Additional resources added to the After Visit Summary for                  DME Arranged: N/A DME Agency: NA       HH Arranged: NA HH Agency: NA        Social Determinants of Health (SDOH) Interventions SDOH Screenings   Food Insecurity: No Food Insecurity (06/21/2023)  Housing: Low Risk  (06/21/2023)  Transportation Needs: No Transportation Needs (06/21/2023)  Utilities: Not At Risk (06/21/2023)  Alcohol Screen: Low Risk  (12/16/2022)  Depression (PHQ2-9): Low Risk  (03/03/2023)  Financial Resource Strain: Patient Declined (03/02/2023)  Physical Activity: Patient Declined (03/02/2023)  Social Connections: Unknown (03/02/2023)  Stress: Patient Declined (03/02/2023)  Tobacco Use: Medium Risk (06/21/2023)  Health Literacy: Adequate Health Literacy (03/03/2023)     Readmission Risk Interventions     No data to display

## 2023-06-23 NOTE — Telephone Encounter (Signed)
Please advise if ok to refill Atorvastatin last filly by: Date: 06/21/2023 Department: [facility] Ordering/Authorizing: Arnetha Courser, MD    Pt also was told to hold Plavix for procedure please advise if ok to refill.

## 2023-06-23 NOTE — Progress Notes (Signed)
[facility] Progress Note   Subjective: Seen in room. No C/Os.  S/P declot 06/22/2023, completed HD around midnight 06/22/2023. Plan for discharge today.      Objective Vitals:   06/23/23 0206 06/23/23 0536 06/23/23 0541 06/23/23 0933  BP: (!) 156/97 90/66 (!) 142/72 115/73  Pulse: 98 82 86 81  Resp: 18 18 18 18   Temp: 98.5 F (36.9 C) 98.4 F (36.9 C) 98.7 F (37.1 C) 98.6 F (37 C)  TempSrc:   Oral   SpO2: 99% 96% 98% 99%  Weight:      Height:       Physical Exam General: Pleasant older male in NAD Heart: S1,S2 No M/R/G.  Lungs: CTAB Abdomen: NABS, NT Extremities: No edema Dialysis Access: L AVF + T/B  Additional Objective Labs: Basic Metabolic Panel: Recent Labs  Lab 06/21/23 1352 06/21/23 1400 06/21/23 1916 06/22/23 0107  NA 140 140  --  139  K 4.8 4.8  --  4.5  CL 111 110  --  108  CO2 16*  --   --  15*  GLUCOSE 190* 183*  --  179*  BUN 109* 119*  --  111*  CREATININE 12.23* 12.80* 12.17* 12.16*  CALCIUM 9.5  --   --  9.3   Liver Function Tests: No results for input(s): "AST", "ALT", "ALKPHOS", "BILITOT", "PROT", "ALBUMIN" in the last 168 hours. No results for input(s): "LIPASE", "AMYLASE" in the last 168 hours. CBC: Recent Labs  Lab 06/21/23 1352 06/21/23 1400 06/21/23 1916 06/22/23 0107 06/23/23 0526  WBC 6.0  --  7.2 7.6 8.1  NEUTROABS 3.8  --   --   --   --   HGB 9.6*   < > 10.5* 10.0* 10.3*  HCT 29.4*   < > 31.9* 29.8* 30.1*  MCV 100.7*  --  96.7 96.4 93.8  PLT 140*  --  150 151 148*   < > = values in this interval not displayed.   Blood Culture No results found for: "SDES", "SPECREQUEST", "CULT", "REPTSTATUS"  Cardiac Enzymes: No results for input(s): "CKTOTAL", "CKMB", "CKMBINDEX", "TROPONINI" in the last 168 hours. CBG: Recent Labs  Lab 06/21/23 2000 06/22/23 0727 06/22/23 1424 06/22/23 1706 06/23/23 0743  GLUCAP 180* 131* 123* 121* 151*   Iron Studies: No results for input(s): "IRON", "TIBC",  "TRANSFERRIN", "FERRITIN" in the last 72 hours. @lablastinr3 @ Studies/Results: VAS US DUPLEX DIALYSIS ACCESS (AVF, AVG)  Result Date: 06/21/2023 DIALYSIS ACCESS Patient Name:  Jeremy Dorsey  Date of Exam:   06/21/2023 Medical Rec #: [id number]         Accession #:    [id number] Date of Birth: 1950-12-12         Patient Gender: M Patient Age:   6 years Exam Location:  [facility] Procedure:      VAS US DUPLEX DIALYSIS ACCESS (AVF, AVG) Referring Phys: Ivin Booty ROBINS --------------------------------------------------------------------------------  Access Site: Created 07/09/21. Failed left RC AVF. Access Type: Brachial-cephalic AVF. History: Mechanical complication - fistula not working. He has not been able to          do his last 3 dialysis sessions due to difficulity accessing his          fistula. Performing Technologist: Marilynne Halsted RDMS, RVT  Examination Guidelines: A complete evaluation includes B-mode imaging, spectral Doppler, color Doppler, and power Doppler as needed of all accessible portions of each vessel. Unilateral testing is considered an integral part of a complete examination. Limited examinations for reoccurring indications  may be performed as noted.  Findings: +---------------+----------+-----------------+--------+ AVF            PSV (cm/s)Flow Vol (mL/min)Comments +---------------+----------+-----------------+--------+ AVF Anastomosis   176                              +---------------+----------+-----------------+--------+  +------------+----------+-------------+----------+-------------------+ OUTFLOW VEINPSV (cm/s)Diameter (cm)Depth (cm)     Describe       +------------+----------+-------------+----------+-------------------+ Shoulder        22                           partially-occlusive +------------+----------+-------------+----------+-------------------+ Prox UA         69                           partially-occlusive  +------------+----------+-------------+----------+-------------------+ Mid UA          25                           partially-occlusive +------------+----------+-------------+----------+-------------------+ Dist UA        104                                               +------------+----------+-------------+----------+-------------------+ AC Fossa       173                                               +------------+----------+-------------+----------+-------------------+   Summary: Arteriovenous fistula-Velocities less than 100cm/s noted. Arteriovenous fistula-Thrombus noted. *See table(s) above for measurements and observations.  Diagnosing physician: Gerarda Fraction Electronically signed by Gerarda Fraction on 06/21/2023 at 4:18:27 PM.   --------------------------------------------------------------------------------   Final    Medications:   amLODipine  5 mg Oral q AM   atorvastatin  80 mg Oral Daily   calcitRIOL  0.25 mcg Oral q AM   carvedilol  12.5 mg Oral BID   Chlorhexidine Gluconate Cloth  6 each Topical Q0600   cinacalcet  30 mg Oral Q M,W,F   insulin aspart  0-9 Units Subcutaneous TID WC   multivitamin  1 tablet Oral Daily   sevelamer carbonate  800 mg Oral TID with meals   torsemide  100 mg Oral q morning    OP HD: home HD , Dr Marisue Humble 3x/ week  4h  450 bfr  102kg   AVF    - Heparin 5000 units IV  - last HD 11/15, just 50 min - HD 11/13 was just 1h long - dry wt lowered recently104 --> 102kg     Assessment/ Plan: Malfunctioning AV fistula - has had 3-4 procedures to "open up a blockage" in the past 1.5 yrs on dialysis. Is on home HD. IR consulted, hopefully declot today. Follow K+ levels.  ESKD - on HD MWF at home. Will be available for any inpatient HD that is needed.  HTN - BP's okay, cont home meds. Net UF 3 liters.  Volume - no vol excess on exam. Get wts.  Anemia of eskd - Hb 9-10 here. Follow.   Disposition: Home today, resume previous home  HD schedule  Dawn Convery H. Manson Passey  NP-C 06/23/2023, 11:26 AM  [employer] [phone]

## 2023-06-23 NOTE — Progress Notes (Signed)
   06/23/23 0030  Vitals  Temp 97.8 F (36.6 C)  Temp Source Oral  BP (!) 103/48  MAP (mmHg) (!) 62  BP Location Left Leg  BP Method Automatic  Patient Position (if appropriate) Lying  Pulse Rate 83  Pulse Rate Source Monitor  ECG Heart Rate 83  Resp 19  Oxygen Therapy  SpO2 96 %  During Treatment Monitoring  Blood Flow Rate (mL/min) 349 mL/min  Arterial Pressure (mmHg) -145.85 mmHg  Venous Pressure (mmHg) 299.18 mmHg  TMP (mmHg) 19.8 mmHg  Ultrafiltration Rate (mL/min) 940 mL/min  Dialysate Flow Rate (mL/min) 300 ml/min  Dialysate Potassium Concentration 3  Dialysate Calcium Concentration 2.5  Duration of HD Treatment -hour(s) 3.99 hour(s)  Cumulative Fluid Removed (mL) per Treatment  2988.94  HD Safety Checks Performed Yes  Intra-Hemodialysis Comments Progressing as prescribed  Post Treatment  Dialyzer Clearance Lightly streaked  Liters Processed 95.6  Fluid Removed (mL) 3000 mL  Tolerated HD Treatment Yes  Post-Hemodialysis Comments pt stable  AVG/AVF Arterial Site Held (minutes) 10 minutes  AVG/AVF Venous Site Held (minutes) 10 minutes   Pt treatment uneventful.

## 2023-06-23 NOTE — Discharge Summary (Signed)
Physician Discharge Summary  Jeremy Dorsey ZOX:[id number] DOB: [birth year] DOA: 06/21/2023  PCP: Ronnald Ramp, MD  Admit date: 06/21/2023 Discharge date: 06/23/2023  Admitted From: Home Disposition: Home  Recommendations for Outpatient Follow-up:  Follow up with PCP in 1-2 weeks Please obtain BMP/CBC in one week your next doctors visit.  Resume home HD   Discharge Condition: Stable CODE STATUS: Full code Diet recommendation: Renal   Brief Narrative:  72 y.o. male well-known to the vascular surgery service having undergone multiple lower extremity arteriograms for PAD, right sided radiocephalic, left-sided brachiocephalic fistulas for end-stage renal disease.  He has been using the left sided brachiocephalic fistula without issue until recently.  Per his wife, the fistula has been very pulsatile, and the cannulae usually used to give blood back is not working appropriately.  Who presents to the ED after no dialysis for the last week.  Patient has had several issues with AV fistula malfunction over the last 1-2 years.  Nephro and IR consulted. Patient underwent mechanical thrombectomy of clotted AV graft/balloon angioplasty of venous outflow stenosis by IR on 11/19.  Did well with HD dialysis overnight.  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  Underwent formal mechanical thrombectomy of clotted AV graft/balloon angioplasty of venous outflow stenosis by IR on 11/19.  Further HD per renal.   Okay to continue heparin drip.  Discussed with nephrology   ESRD on dialysis [facility]) Nephro following   Essential hypertension -Continue home amlodipine and Coreg.  IV as needed   Diabetes mellitus with nephropathy (HCC) Patient was not on any medications at home. -Monitor CBG   History of CAD (coronary artery disease) S/p PCI with 2 stents placed in 2021.  Resume home aspirin and Plavix once okay  by IR   Hyperlipidemia LDL goal <70 -Continue home Lipitor  DVT prophylaxis:  Code Status: Full Code Family communication  Disposition Plan: Significant other at bedside Level of care: Med-Surg Status is: Observation The patient remains OBS appropriate and will d/c before 2 midnights.  Discharge today     Consultants:  Renal Vascular IR     Subjective: Doing well wishing to go home.  Physical exam: Constitutional: Not in acute distress Respiratory: Clear to auscultation bilaterally Cardiovascular: Normal sinus rhythm, no rubs Abdomen: Nontender nondistended good bowel sounds Musculoskeletal: No edema noted Skin: No rashes seen Neurologic: CN 2-12 grossly intact.  And nonfocal Psychiatric: Normal judgment and insight. Alert and oriented x 3. Normal mood.   Discharge Diagnoses:  Principal Problem:   AV fistula occlusion (HCC) Active Problems:   ESRD on dialysis [facility]   Essential hypertension   Diabetes mellitus with nephropathy (HCC)   History of CAD (coronary artery disease)   Hyperlipidemia LDL goal <70      Discharge Exam: Vitals:   06/23/23 0541 06/23/23 0933  BP: (!) 142/72 115/73  Pulse: 86 81  Resp: 18 18  Temp: 98.7 F (37.1 C) 98.6 F (37 C)  SpO2: 98% 99%   Vitals:   06/23/23 0206 06/23/23 0536 06/23/23 0541 06/23/23 0933  BP: (!) 156/97 90/66 (!) 142/72 115/73  Pulse: 98 82 86 81  Resp: 18 18 18 18   Temp: 98.5 F (36.9 C) 98.4 F (36.9 C) 98.7 F (37.1 C) 98.6 F (37 C)  TempSrc:   Oral   SpO2: 99% 96% 98% 99%  Weight:      Height:  Discharge Instructions   Allergies as of 06/23/2023       Reactions   Oxycodone Nausea And Vomiting   Hydrocodone Nausea And Vomiting        Medication List     TAKE these medications    acetaminophen 500 MG tablet Commonly known as: TYLENOL Take 1,500-2,000 mg by mouth every 6 (six) hours as needed for mild pain (pain score 1-3) or moderate pain (pain score 4-6).   amLODipine  5 MG tablet Commonly known as: NORVASC Take 5 mg by mouth in the morning.   Aspirin Low Dose 81 MG tablet Generic drug: aspirin EC TAKE 1 TABLET BY MOUTH EVERY MORNING (SWALLOW WHOLE) What changed: See the new instructions.   atorvastatin 80 MG tablet Commonly known as: LIPITOR TAKE 1 TABLET BY MOUTH DAILY   Auryxia 1 GM 210 MG(Fe) tablet Generic drug: ferric citrate Take by mouth.   calcitRIOL 0.25 MCG capsule Commonly known as: ROCALTROL Take 0.25 mcg by mouth in the morning.   carvedilol 12.5 MG tablet Commonly known as: COREG Take 1 tablet by mouth 2 (two) times daily.   cinacalcet 30 MG tablet Commonly known as: SENSIPAR Take 30 mg by mouth every Monday, Wednesday, and Friday.   clopidogrel 75 MG tablet Commonly known as: PLAVIX TAKE 1 TABLET BY MOUTH DAILY   lidocaine-prilocaine cream Commonly known as: EMLA Apply 1 Application topically every Monday, Wednesday, and Friday.   MIRCERA IJ Inject 1 each into the skin See admin instructions. During Dialysis   multivitamin Tabs tablet Take 1 tablet by mouth daily.   mupirocin cream 2 % Commonly known as: BACTROBAN Apply 1 Application topically daily.   nitroGLYCERIN 0.4 MG SL tablet Commonly known as: NITROSTAT Place 1 tablet (0.4 mg total) under the tongue every 5 (five) minutes x 3 doses as needed for chest pain.   sevelamer carbonate 800 MG tablet Commonly known as: RENVELA Take 800 mg by mouth with breakfast, with lunch, and with evening meal.   sildenafil 100 MG tablet Commonly known as: VIAGRA Take 100 mg by mouth as needed for erectile dysfunction.   torsemide 100 MG tablet Commonly known as: DEMADEX Take 100 mg by mouth every morning.        Follow-up Information     Simmons-Robinson, Makiera, MD Follow up in 1 week(s).   Specialty: Family Medicine Contact information: [address] [place] [address] [phone]                Allergies  Allergen  Reactions   Oxycodone Nausea And Vomiting   Hydrocodone Nausea And Vomiting    You were cared for by a hospitalist during your hospital stay. If you have any questions about your discharge medications or the care you received while you were in the hospital after you are discharged, you can call the unit and asked to speak with the hospitalist on call if the hospitalist that took care of you is not available. Once you are discharged, your primary care physician will handle any further medical issues. Please note that no refills for any discharge medications will be authorized once you are discharged, as it is imperative that you return to your primary care physician (or establish a relationship with a primary care physician if you do not have one) for your aftercare needs so that they can reassess your need for medications and monitor your lab values.  You were cared for by a hospitalist during your hospital stay. If you have  any questions about your discharge medications or the care you received while you were in the hospital after you are discharged, you can call the unit and asked to speak with the hospitalist on call if the hospitalist that took care of you is not available. Once you are discharged, your primary care physician will handle any further medical issues. Please note that NO REFILLS for any discharge medications will be authorized once you are discharged, as it is imperative that you return to your primary care physician (or establish a relationship with a primary care physician if you do not have one) for your aftercare needs so that they can reassess your need for medications and monitor your lab values.  Please request your Prim.MD to go over all Hospital Tests and Procedure/Radiological results at the follow up, please get all Hospital records sent to your Prim MD by signing hospital release before you go home.  Get CBC, CMP, 2 view Chest X ray checked  by Primary MD during your next  visit or SNF MD in 5-7 days ( we routinely change or add medications that can affect your baseline labs and fluid status, therefore we recommend that you get the mentioned basic workup next visit with your PCP, your PCP may decide not to get them or add new tests based on their clinical decision)  On your next visit with your primary care physician please Get Medicines reviewed and adjusted.  If you experience worsening of your admission symptoms, develop shortness of breath, life threatening emergency, suicidal or homicidal thoughts you must seek medical attention immediately by calling 911 or calling your MD immediately  if symptoms less severe.  You Must read complete instructions/literature along with all the possible adverse reactions/side effects for all the Medicines you take and that have been prescribed to you. Take any new Medicines after you have completely understood and accpet all the possible adverse reactions/side effects.   Do not drive, operate heavy machinery, perform activities at heights, swimming or participation in water activities or provide baby sitting services if your were admitted for syncope or siezures until you have seen by Primary MD or a Neurologist and advised to do so again.  Do not drive when taking Pain medications.   Procedures/Studies: VAS US DUPLEX DIALYSIS ACCESS (AVF, AVG)  Result Date: 06/21/2023 DIALYSIS ACCESS Patient Name:  Jeremy Dorsey  Date of Exam:   06/21/2023 Medical Rec #: [id number]         Accession #:    [id number] Date of Birth: [birth year]         Patient Gender: M Patient Age:   35 years Exam Location:  [facility] Procedure:      VAS US DUPLEX DIALYSIS ACCESS (AVF, AVG) Referring Phys: Ivin Booty ROBINS --------------------------------------------------------------------------------  Access Site: Created 07/09/21. Failed left RC AVF. Access Type: Brachial-cephalic AVF. History: Mechanical complication - fistula not working. He has not  been able to          do his last 3 dialysis sessions due to difficulity accessing his          fistula. Performing Technologist: Marilynne Halsted RDMS, RVT  Examination Guidelines: A complete evaluation includes B-mode imaging, spectral Doppler, color Doppler, and power Doppler as needed of all accessible portions of each vessel. Unilateral testing is considered an integral part of a complete examination. Limited examinations for reoccurring indications may be performed as noted.  Findings: +---------------+----------+-----------------+--------+ AVF  PSV (cm/s)Flow Vol (mL/min)Comments +---------------+----------+-----------------+--------+ AVF Anastomosis   176                              +---------------+----------+-----------------+--------+  +------------+----------+-------------+----------+-------------------+ OUTFLOW VEINPSV (cm/s)Diameter (cm)Depth (cm)     Describe       +------------+----------+-------------+----------+-------------------+ Shoulder        22                           partially-occlusive +------------+----------+-------------+----------+-------------------+ Prox UA         69                           partially-occlusive +------------+----------+-------------+----------+-------------------+ Mid UA          25                           partially-occlusive +------------+----------+-------------+----------+-------------------+ Dist UA        104                                               +------------+----------+-------------+----------+-------------------+ AC Fossa       173                                               +------------+----------+-------------+----------+-------------------+   Summary: Arteriovenous fistula-Velocities less than 100cm/s noted. Arteriovenous fistula-Thrombus noted. *See table(s) above for measurements and observations.  Diagnosing physician: Gerarda Fraction Electronically signed by Gerarda Fraction on  06/21/2023 at 4:18:27 PM.   --------------------------------------------------------------------------------   Final    MR CARDIAC MORPHOLOGY W WO CONTRAST  Result Date: 06/02/2023 CLINICAL DATA:  Hx of CAD/NSTEMI, evaluate for infiltrative disease EXAM: CARDIAC MRI TECHNIQUE: The patient was scanned on a 1.5 Tesla Siemens magnet. A dedicated cardiac coil was used. Functional imaging was done using Fiesta sequences. 2,3, and 4 chamber views were done to assess for RWMA's. Modified Simpson's rule using a short axis stack was used to calculate an ejection fraction on a dedicated work [employer]. The patient received 13 cc of Gadavist. 10 minutes post contrast inversion recovery sequences were performed. Velocity flow mapping performed in the ascending aorta and main pulmonary artery. CONTRAST:  13 cc  of Gadavist FINDINGS: 1. Normal left ventricular size, mild Lv wall thickness, normal systolic function (LVEF = 57%). There are no regional wall motion abnormalities. There is diffuse midwall late gadolinium enhancement in the basal left ventricular myocardium. LVEDV: 192 ml LVESV: 82 ml SV: 110 ml CO: 5.7 L/min Myocardial mass: 207 g LV native T1 map 1066 ms. (Normal < 1000 ms) 2. Normal right ventricular size, thickness and systolic function (RVEF = 60). There are no regional wall motion abnormalities. 3.  Normal left and right atrial size. 4. Normal size of the aortic root, ascending aorta and pulmonary artery. 5.  Tricuspid aortic valve.  no significant valvular abnormalities. 6.  Normal pericardium.  No pericardial effusion. IMPRESSION: 1.  Normal LV size and systolic function.  LVEF 57%. 2.  Diffuse midwall basal LGE/scar. 3. ECV not calculated due to exam ending prematurely as per patient request. 4.  Normal RV size and function. 5. Consider clinical workup for amyloidosis due to diffuse basal LGE (apical sparing) Electronically Signed   By: Debbe Odea M.D.   On: 06/02/2023 15:25    MR CARDIAC VELOCITY FLOW MAP  Result Date: 06/02/2023 CLINICAL DATA:  Hx of CAD/NSTEMI, evaluate for infiltrative disease EXAM: CARDIAC MRI TECHNIQUE: The patient was scanned on a 1.5 Tesla Siemens magnet. A dedicated cardiac coil was used. Functional imaging was done using Fiesta sequences. 2,3, and 4 chamber views were done to assess for RWMA's. Modified Simpson's rule using a short axis stack was used to calculate an ejection fraction on a dedicated work [employer], [employer]. The patient received 13 cc of Gadavist. 10 minutes post contrast inversion recovery sequences were performed. Velocity flow mapping performed in the ascending aorta and main pulmonary artery. CONTRAST:  13 cc  of Gadavist FINDINGS: 1. Normal left ventricular size, mild Lv wall thickness, normal systolic function (LVEF = 57%). There are no regional wall motion abnormalities. There is diffuse midwall late gadolinium enhancement in the basal left ventricular myocardium. LVEDV: 192 ml LVESV: 82 ml SV: 110 ml CO: 5.7 L/min Myocardial mass: 207 g LV native T1 map 1066 ms. (Normal < 1000 ms) 2. Normal right ventricular size, thickness and systolic function (RVEF = 60). There are no regional wall motion abnormalities. 3.  Normal left and right atrial size. 4. Normal size of the aortic root, ascending aorta and pulmonary artery. 5.  Tricuspid aortic valve.  no significant valvular abnormalities. 6.  Normal pericardium.  No pericardial effusion. IMPRESSION: 1.  Normal LV size and systolic function.  LVEF 57%. 2.  Diffuse midwall basal LGE/scar. 3. ECV not calculated due to exam ending prematurely as per patient request. 4.  Normal RV size and function. 5. Consider clinical workup for amyloidosis due to diffuse basal LGE (apical sparing) Electronically Signed   By: Debbe Odea M.D.   On: 06/02/2023 15:25   MR CARDIAC VELOCITY FLOW MAP  Result Date: 06/02/2023 CLINICAL DATA:  Hx of CAD/NSTEMI, evaluate for infiltrative  disease EXAM: CARDIAC MRI TECHNIQUE: The patient was scanned on a 1.5 Tesla Siemens magnet. A dedicated cardiac coil was used. Functional imaging was done using Fiesta sequences. 2,3, and 4 chamber views were done to assess for RWMA's. Modified Simpson's rule using a short axis stack was used to calculate an ejection fraction on a dedicated work [employer], [employer]. The patient received 13 cc of Gadavist. 10 minutes post contrast inversion recovery sequences were performed. Velocity flow mapping performed in the ascending aorta and main pulmonary artery. CONTRAST:  13 cc  of Gadavist FINDINGS: 1. Normal left ventricular size, mild Lv wall thickness, normal systolic function (LVEF = 57%). There are no regional wall motion abnormalities. There is diffuse midwall late gadolinium enhancement in the basal left ventricular myocardium. LVEDV: 192 ml LVESV: 82 ml SV: 110 ml CO: 5.7 L/min Myocardial mass: 207 g LV native T1 map 1066 ms. (Normal < 1000 ms) 2. Normal right ventricular size, thickness and systolic function (RVEF = 60). There are no regional wall motion abnormalities. 3.  Normal left and right atrial size. 4. Normal size of the aortic root, ascending aorta and pulmonary artery. 5.  Tricuspid aortic valve.  no significant valvular abnormalities. 6.  Normal pericardium.  No pericardial effusion. IMPRESSION: 1.  Normal LV size and systolic function.  LVEF 57%. 2.  Diffuse midwall basal LGE/scar. 3. ECV not calculated due to exam ending prematurely as per  patient request. 4.  Normal RV size and function. 5. Consider clinical workup for amyloidosis due to diffuse basal LGE (apical sparing) Electronically Signed   By: Debbe Odea M.D.   On: 06/02/2023 15:25     The results of significant diagnostics from this hospitalization (including imaging, microbiology, ancillary and laboratory) are listed below for reference.     Microbiology: Recent Results (from the past 240 hour(s))  MRSA Next Gen by  PCR, Nasal     Status: None   Collection Time: 06/22/23  3:25 AM   Specimen: Nasal Mucosa; Nasal Swab  Result Value Ref Range Status   MRSA by PCR Next Gen NOT DETECTED NOT DETECTED Final    Comment: (NOTE) The GeneXpert MRSA Assay (FDA approved for NASAL specimens only), is one component of a comprehensive MRSA colonization surveillance program. It is not intended to diagnose MRSA infection nor to guide or monitor treatment for MRSA infections. Test performance is not FDA approved in patients less than 31 years old. Performed at [facility], [address], [place], [place] [address]      Labs: BNP (last 3 results) No results for input(s): "BNP" in the last 8760 hours. Basic Metabolic Panel: Recent Labs  Lab 06/21/23 1352 06/21/23 1400 06/21/23 1916 06/22/23 0107  NA 140 140  --  139  K 4.8 4.8  --  4.5  CL 111 110  --  108  CO2 16*  --   --  15*  GLUCOSE 190* 183*  --  179*  BUN 109* 119*  --  111*  CREATININE 12.23* 12.80* 12.17* 12.16*  CALCIUM 9.5  --   --  9.3   Liver Function Tests: No results for input(s): "AST", "ALT", "ALKPHOS", "BILITOT", "PROT", "ALBUMIN" in the last 168 hours. No results for input(s): "LIPASE", "AMYLASE" in the last 168 hours. No results for input(s): "AMMONIA" in the last 168 hours. CBC: Recent Labs  Lab 06/21/23 1352 06/21/23 1400 06/21/23 1916 06/22/23 0107 06/23/23 0526  WBC 6.0  --  7.2 7.6 8.1  NEUTROABS 3.8  --   --   --   --   HGB 9.6* 9.2* 10.5* 10.0* 10.3*  HCT 29.4* 27.0* 31.9* 29.8* 30.1*  MCV 100.7*  --  96.7 96.4 93.8  PLT 140*  --  150 151 148*   Cardiac Enzymes: No results for input(s): "CKTOTAL", "CKMB", "CKMBINDEX", "TROPONINI" in the last 168 hours. BNP: Invalid input(s): "POCBNP" CBG: Recent Labs  Lab 06/21/23 2000 06/22/23 0727 06/22/23 1424 06/22/23 1706 06/23/23 0743  GLUCAP 180* 131* 123* 121* 151*   D-Dimer No results for input(s): "DDIMER" in the last 72 hours. Hgb A1c No results for  input(s): "HGBA1C" in the last 72 hours. Lipid Profile No results for input(s): "CHOL", "HDL", "LDLCALC", "TRIG", "CHOLHDL", "LDLDIRECT" in the last 72 hours. Thyroid function studies No results for input(s): "TSH", "T4TOTAL", "T3FREE", "THYROIDAB" in the last 72 hours.  Invalid input(s): "FREET3" Anemia work up No results for input(s): "VITAMINB12", "FOLATE", "FERRITIN", "TIBC", "IRON", "RETICCTPCT" in the last 72 hours. Urinalysis    Component Value Date/Time   COLORURINE RED (A) 12/23/2022 1125   APPEARANCEUR CLOUDY (A) 12/23/2022 1125   APPEARANCEUR Clear 08/11/2018 0927   LABSPEC 1.013 12/23/2022 1125   PHURINE  12/23/2022 1125    TEST NOT REPORTED DUE TO COLOR INTERFERENCE OF URINE PIGMENT   GLUCOSEU (A) 12/23/2022 1125    TEST NOT REPORTED DUE TO COLOR INTERFERENCE OF URINE PIGMENT   HGBUR (A) 12/23/2022 1125  TEST NOT REPORTED DUE TO COLOR INTERFERENCE OF URINE PIGMENT   BILIRUBINUR (A) 12/23/2022 1125    TEST NOT REPORTED DUE TO COLOR INTERFERENCE OF URINE PIGMENT   BILIRUBINUR negative 12/16/2022 1043   BILIRUBINUR Negative 08/11/2018 0927   KETONESUR (A) 12/23/2022 1125    TEST NOT REPORTED DUE TO COLOR INTERFERENCE OF URINE PIGMENT   PROTEINUR (A) 12/23/2022 1125    TEST NOT REPORTED DUE TO COLOR INTERFERENCE OF URINE PIGMENT   UROBILINOGEN 0.2 12/16/2022 1043   NITRITE (A) 12/23/2022 1125    TEST NOT REPORTED DUE TO COLOR INTERFERENCE OF URINE PIGMENT   LEUKOCYTESUR (A) 12/23/2022 1125    TEST NOT REPORTED DUE TO COLOR INTERFERENCE OF URINE PIGMENT   Sepsis Labs Recent Labs  Lab 06/21/23 1352 06/21/23 1916 06/22/23 0107 06/23/23 0526  WBC 6.0 7.2 7.6 8.1   Microbiology Recent Results (from the past 240 hour(s))  MRSA Next Gen by PCR, Nasal     Status: None   Collection Time: 06/22/23  3:25 AM   Specimen: Nasal Mucosa; Nasal Swab  Result Value Ref Range Status   MRSA by PCR Next Gen NOT DETECTED NOT DETECTED Final    Comment: (NOTE) The GeneXpert  MRSA Assay (FDA approved for NASAL specimens only), is one component of a comprehensive MRSA colonization surveillance program. It is not intended to diagnose MRSA infection nor to guide or monitor treatment for MRSA infections. Test performance is not FDA approved in patients less than 14 years old. Performed at [facility], [address], [place], [place] [address]      Time coordinating discharge:  I have spent 35 minutes face to face with the patient and on the ward discussing the patients care, assessment, plan and disposition with other care givers. >50% of the time was devoted counseling the patient about the risks and benefits of treatment/Discharge disposition and coordinating care.   SIGNED:   Miguel Rota, MD  [facility] 06/23/2023, 10:52 AM   If 7PM-7AM, please contact night-coverage

## 2023-06-23 NOTE — Progress Notes (Signed)
D/C order noted. Contacted [facility] home therapy dept to advise staff of pt's d/c today and that pt will resume home therapy at d/c.   Olivia Canter Renal Navigator [phone]

## 2023-06-23 NOTE — Hospital Course (Addendum)
  Brief Narrative:  72 y.o. male well-known to the vascular surgery service having undergone multiple lower extremity arteriograms for PAD, right sided radiocephalic, left-sided brachiocephalic fistulas for end-stage renal disease.  He has been using the left sided brachiocephalic fistula without issue until recently.  Per his wife, the fistula has been very pulsatile, and the cannulae usually used to give blood back is not working appropriately.  Who presents to the ED after no dialysis for the last week.  Patient has had several issues with AV fistula malfunction over the last 1-2 years.  Nephro and IR consulted. Patient underwent mechanical thrombectomy of clotted AV graft/balloon angioplasty of venous outflow stenosis by IR on 11/19.  Did well with HD dialysis overnight.  Today medically stable for discharge     Assessment and Plan: * AV fistula occlusion [facility]) Nephrology and IR consulted.  Underwent formal mechanical thrombectomy of clotted AV graft/balloon angioplasty of venous outflow stenosis by IR on 11/19.  Further HD per renal.   Okay to continue heparin drip.  Discussed with nephrology   ESRD on dialysis [facility]) Nephro following   Essential hypertension -Continue home amlodipine and Coreg.  IV as needed   Diabetes mellitus with nephropathy (HCC) Patient was not on any medications at home. -Monitor CBG   History of CAD (coronary artery disease) S/p PCI with 2 stents placed in 2021.  Resume home aspirin and Plavix once okay by IR   Hyperlipidemia LDL goal <70 -Continue home Lipitor  DVT prophylaxis:  Code Status: Full Code Family communication  Disposition Plan: Significant other at bedside Level of care: Med-Surg Status is: Observation The patient remains OBS appropriate and will d/c before 2 midnights.  Discharge today     Consultants:  Renal Vascular IR     Subjective: Doing well wishing to go home.  Physical exam: Constitutional: Not in acute  distress Respiratory: Clear to auscultation bilaterally Cardiovascular: Normal sinus rhythm, no rubs Abdomen: Nontender nondistended good bowel sounds Musculoskeletal: No edema noted Skin: No rashes seen Neurologic: CN 2-12 grossly intact.  And nonfocal Psychiatric: Normal judgment and insight. Alert and oriented x 3. Normal mood.

## 2023-06-23 NOTE — Progress Notes (Signed)
Mobility Specialist Progress Note:   06/23/23 0925  Mobility  Activity Ambulated independently in hallway  Level of Assistance Modified independent, requires aide device or extra time  Assistive Device None  Distance Ambulated (ft) 200 ft  Activity Response Tolerated well  Mobility Referral Yes  $Mobility charge 1 Mobility  Mobility Specialist Start Time (ACUTE ONLY) 0925  Mobility Specialist Stop Time (ACUTE ONLY) 0935  Mobility Specialist Time Calculation (min) (ACUTE ONLY) 10 min   Pt agreeable to mobility session. Required no physical assistance throughout ambulation. Pt asx throughout. Back sitting EOB with all needs met.   Addison Lank Mobility Specialist Please contact via SecureChat or  Rehab office at [phone]

## 2023-06-25 DIAGNOSIS — N2581 Secondary hyperparathyroidism of renal origin: Secondary | ICD-10-CM | POA: Diagnosis not present

## 2023-06-25 DIAGNOSIS — N186 End stage renal disease: Secondary | ICD-10-CM | POA: Diagnosis not present

## 2023-06-25 DIAGNOSIS — Z992 Dependence on renal dialysis: Secondary | ICD-10-CM | POA: Diagnosis not present

## 2023-06-28 ENCOUNTER — Encounter: Payer: Self-pay | Admitting: Cardiovascular Disease

## 2023-06-28 DIAGNOSIS — N2581 Secondary hyperparathyroidism of renal origin: Secondary | ICD-10-CM | POA: Diagnosis not present

## 2023-06-28 DIAGNOSIS — Z992 Dependence on renal dialysis: Secondary | ICD-10-CM | POA: Diagnosis not present

## 2023-06-28 DIAGNOSIS — N186 End stage renal disease: Secondary | ICD-10-CM | POA: Diagnosis not present

## 2023-06-30 DIAGNOSIS — N186 End stage renal disease: Secondary | ICD-10-CM | POA: Diagnosis not present

## 2023-06-30 DIAGNOSIS — N2581 Secondary hyperparathyroidism of renal origin: Secondary | ICD-10-CM | POA: Diagnosis not present

## 2023-06-30 DIAGNOSIS — Z992 Dependence on renal dialysis: Secondary | ICD-10-CM | POA: Diagnosis not present

## 2023-07-02 DIAGNOSIS — N186 End stage renal disease: Secondary | ICD-10-CM | POA: Diagnosis not present

## 2023-07-02 DIAGNOSIS — Z992 Dependence on renal dialysis: Secondary | ICD-10-CM | POA: Diagnosis not present

## 2023-07-02 DIAGNOSIS — N2581 Secondary hyperparathyroidism of renal origin: Secondary | ICD-10-CM | POA: Diagnosis not present

## 2023-07-03 DIAGNOSIS — Z992 Dependence on renal dialysis: Secondary | ICD-10-CM | POA: Diagnosis not present

## 2023-07-03 DIAGNOSIS — N186 End stage renal disease: Secondary | ICD-10-CM | POA: Diagnosis not present

## 2023-07-03 DIAGNOSIS — I129 Hypertensive chronic kidney disease with stage 1 through stage 4 chronic kidney disease, or unspecified chronic kidney disease: Secondary | ICD-10-CM | POA: Diagnosis not present

## 2023-07-05 ENCOUNTER — Encounter: Payer: Self-pay | Admitting: Family Medicine

## 2023-07-05 DIAGNOSIS — N2581 Secondary hyperparathyroidism of renal origin: Secondary | ICD-10-CM | POA: Diagnosis not present

## 2023-07-05 DIAGNOSIS — Z992 Dependence on renal dialysis: Secondary | ICD-10-CM | POA: Diagnosis not present

## 2023-07-05 DIAGNOSIS — N186 End stage renal disease: Secondary | ICD-10-CM | POA: Diagnosis not present

## 2023-07-06 ENCOUNTER — Ambulatory Visit: Payer: Medicare HMO | Admitting: Physician Assistant

## 2023-07-07 ENCOUNTER — Telehealth (HOSPITAL_COMMUNITY): Payer: Self-pay | Admitting: *Deleted

## 2023-07-07 DIAGNOSIS — N2581 Secondary hyperparathyroidism of renal origin: Secondary | ICD-10-CM | POA: Diagnosis not present

## 2023-07-07 DIAGNOSIS — G5613 Other lesions of median nerve, bilateral upper limbs: Secondary | ICD-10-CM | POA: Diagnosis not present

## 2023-07-07 DIAGNOSIS — G5603 Carpal tunnel syndrome, bilateral upper limbs: Secondary | ICD-10-CM | POA: Diagnosis not present

## 2023-07-07 DIAGNOSIS — Z992 Dependence on renal dialysis: Secondary | ICD-10-CM | POA: Diagnosis not present

## 2023-07-07 DIAGNOSIS — N186 End stage renal disease: Secondary | ICD-10-CM | POA: Diagnosis not present

## 2023-07-07 NOTE — Telephone Encounter (Signed)
Called patient to remind him of his Amyloid study on 07/13/23 at 12:30

## 2023-07-09 ENCOUNTER — Encounter: Payer: Self-pay | Admitting: Family Medicine

## 2023-07-09 DIAGNOSIS — N2581 Secondary hyperparathyroidism of renal origin: Secondary | ICD-10-CM | POA: Diagnosis not present

## 2023-07-09 DIAGNOSIS — Z992 Dependence on renal dialysis: Secondary | ICD-10-CM | POA: Diagnosis not present

## 2023-07-09 DIAGNOSIS — N186 End stage renal disease: Secondary | ICD-10-CM | POA: Diagnosis not present

## 2023-07-09 NOTE — Telephone Encounter (Signed)
 Care team updated and letter sent for eye exam notes.

## 2023-07-12 DIAGNOSIS — Z992 Dependence on renal dialysis: Secondary | ICD-10-CM | POA: Diagnosis not present

## 2023-07-12 DIAGNOSIS — N2581 Secondary hyperparathyroidism of renal origin: Secondary | ICD-10-CM | POA: Diagnosis not present

## 2023-07-12 DIAGNOSIS — N186 End stage renal disease: Secondary | ICD-10-CM | POA: Diagnosis not present

## 2023-07-13 ENCOUNTER — Ambulatory Visit (HOSPITAL_COMMUNITY): Payer: Medicare HMO | Attending: Cardiovascular Disease

## 2023-07-13 DIAGNOSIS — I25118 Atherosclerotic heart disease of native coronary artery with other forms of angina pectoris: Secondary | ICD-10-CM | POA: Insufficient documentation

## 2023-07-13 LAB — MYOCARDIAL AMYLOID PLANAR & SPECT: H/CL Ratio: 1.06

## 2023-07-13 MED ORDER — TECHNETIUM TC 99M PYROPHOSPHATE
21.6000 | Freq: Once | INTRAVENOUS | Status: AC
  Administered 2023-07-13: 21.6 via INTRAVENOUS

## 2023-07-14 DIAGNOSIS — N186 End stage renal disease: Secondary | ICD-10-CM | POA: Diagnosis not present

## 2023-07-14 DIAGNOSIS — N2581 Secondary hyperparathyroidism of renal origin: Secondary | ICD-10-CM | POA: Diagnosis not present

## 2023-07-14 DIAGNOSIS — Z992 Dependence on renal dialysis: Secondary | ICD-10-CM | POA: Diagnosis not present

## 2023-07-16 DIAGNOSIS — Z992 Dependence on renal dialysis: Secondary | ICD-10-CM | POA: Diagnosis not present

## 2023-07-16 DIAGNOSIS — N186 End stage renal disease: Secondary | ICD-10-CM | POA: Diagnosis not present

## 2023-07-16 DIAGNOSIS — N2581 Secondary hyperparathyroidism of renal origin: Secondary | ICD-10-CM | POA: Diagnosis not present

## 2023-07-19 DIAGNOSIS — Z992 Dependence on renal dialysis: Secondary | ICD-10-CM | POA: Diagnosis not present

## 2023-07-19 DIAGNOSIS — N186 End stage renal disease: Secondary | ICD-10-CM | POA: Diagnosis not present

## 2023-07-19 DIAGNOSIS — N2581 Secondary hyperparathyroidism of renal origin: Secondary | ICD-10-CM | POA: Diagnosis not present

## 2023-07-20 ENCOUNTER — Encounter: Payer: Self-pay | Admitting: Family Medicine

## 2023-07-20 ENCOUNTER — Ambulatory Visit (INDEPENDENT_AMBULATORY_CARE_PROVIDER_SITE_OTHER): Payer: Medicare HMO | Admitting: Family Medicine

## 2023-07-20 VITALS — BP 137/66 | HR 78 | Temp 97.8°F | Ht 70.0 in | Wt 228.0 lb

## 2023-07-20 DIAGNOSIS — L989 Disorder of the skin and subcutaneous tissue, unspecified: Secondary | ICD-10-CM | POA: Diagnosis not present

## 2023-07-20 MED ORDER — SULFAMETHOXAZOLE-TRIMETHOPRIM 800-160 MG PO TABS: 1.0000 | ORAL_TABLET | Freq: Every day | ORAL | 0 refills | Status: DC

## 2023-07-20 NOTE — Progress Notes (Signed)
Established patient visit  Patient: Jeremy Dorsey   DOB: 02/09/1951   72 y.o. Male  MRN: [id number] Visit Date: 07/20/2023  Today's healthcare provider: Jacky Kindle, FNP  Introduced to nurse practitioner role and practice setting.  All questions answered.  Discussed provider/patient relationship and expectations.  Subjective    HPI HPI   Pt stated---left side upper back--have a hard knot--painful w/ pressure, redness--2 weeks. Tried neosporin w/ patch.  Last edited by Shelly Bombard, CMA on 07/20/2023  1:07 PM.      Pt presents with concern with back lesion.  Medications: Outpatient Medications Prior to Visit  Medication Sig   acetaminophen (TYLENOL) 500 MG tablet Take 1,500-2,000 mg by mouth every 6 (six) hours as needed for mild pain (pain score 1-3) or moderate pain (pain score 4-6).   amLODipine (NORVASC) 5 MG tablet Take 5 mg by mouth in the morning.   aspirin EC (ASPIRIN LOW DOSE) 81 MG tablet TAKE 1 TABLET BY MOUTH EVERY MORNING (SWALLOW WHOLE) (Patient taking differently: Take 81 mg by mouth daily.)   atorvastatin (LIPITOR) 80 MG tablet TAKE 1 TABLET BY MOUTH EVERY DAY   AURYXIA 1 GM 210 MG(Fe) tablet Take by mouth.   calcitRIOL (ROCALTROL) 0.25 MCG capsule Take 0.25 mcg by mouth in the morning.   carvedilol (COREG) 12.5 MG tablet Take 1 tablet by mouth 2 (two) times daily.   cinacalcet (SENSIPAR) 30 MG tablet Take 30 mg by mouth every Monday, Wednesday, and Friday.   clopidogrel (PLAVIX) 75 MG tablet TAKE 1 TABLET BY MOUTH EVERY DAY   lidocaine-prilocaine (EMLA) cream Apply 1 Application topically every Monday, Wednesday, and Friday.   Methoxy PEG-Epoetin Beta (MIRCERA IJ) Inject 1 each into the skin See admin instructions. During Dialysis   multivitamin (RENA-VIT) TABS tablet Take 1 tablet by mouth daily.   mupirocin cream (BACTROBAN) 2 % Apply 1 Application topically daily.   nitroGLYCERIN (NITROSTAT) 0.4 MG SL tablet Place 1 tablet (0.4 mg total) under the  tongue every 5 (five) minutes x 3 doses as needed for chest pain.   sevelamer carbonate (RENVELA) 800 MG tablet Take 800 mg by mouth with breakfast, with lunch, and with evening meal.   sildenafil (VIAGRA) 100 MG tablet Take 100 mg by mouth as needed for erectile dysfunction.   torsemide (DEMADEX) 100 MG tablet Take 100 mg by mouth every morning.   No facility-administered medications prior to visit.   Last CBC Lab Results  Component Value Date   WBC 8.1 06/23/2023   HGB 10.3 (L) 06/23/2023   HCT 30.1 (L) 06/23/2023   MCV 93.8 06/23/2023   MCH 32.1 06/23/2023   RDW 12.9 06/23/2023   PLT 148 (L) 06/23/2023   Last metabolic panel Lab Results  Component Value Date   GLUCOSE 179 (H) 06/22/2023   NA 139 06/22/2023   K 4.5 06/22/2023   CL 108 06/22/2023   CO2 15 (L) 06/22/2023   BUN 111 (H) 06/22/2023   CREATININE 12.16 (H) 06/22/2023   GFRNONAA 4 (L) 06/22/2023   CALCIUM 9.3 06/22/2023   PHOS 4.2 04/07/2016   PROT 6.4 06/11/2023   ALBUMIN 4.5 04/30/2023   LABGLOB 2.4 06/11/2023   LABGLOB CANCELED 06/11/2023   AGRATIO CANCELED 06/11/2023   BILITOT 0.3 04/30/2023   ALKPHOS 110 04/30/2023   AST 19 04/30/2023   ALT 24 04/30/2023   ANIONGAP 16 (H) 06/22/2023   Last lipids Lab Results  Component Value Date   CHOL 87 (L)  04/12/2023   HDL 24 (L) 04/12/2023   LDLCALC 43 04/12/2023   TRIG 108 04/12/2023   CHOLHDL 3.6 04/12/2023   Last hemoglobin A1c Lab Results  Component Value Date   HGBA1C 6.2 05/20/2021     Objective    BP 137/66 (BP Location: Right Arm, Patient Position: Sitting, Cuff Size: Large)   Pulse 78   Temp 97.8 F (36.6 C)   Ht 5\' 10"  (1.778 m)   Wt 228 lb (103.4 kg)   SpO2 97%   BMI 32.71 kg/m   BP Readings from Last 3 Encounters:  07/20/23 137/66  06/23/23 115/73  06/11/23 (!) 140/72   Wt Readings from Last 3 Encounters:  07/20/23 228 lb (103.4 kg)  07/13/23 229 lb (103.9 kg)  06/21/23 229 lb 0.9 oz (103.9 kg)   SpO2 Readings from Last 3  Encounters:  07/20/23 97%  06/23/23 99%  06/11/23 99%   Physical Exam Vitals and nursing note reviewed.  Constitutional:      General: He is not in acute distress.    Appearance: Normal appearance. He is obese. He is not ill-appearing or toxic-appearing.  HENT:     Head: Normocephalic and atraumatic.  Cardiovascular:     Rate and Rhythm: Normal rate and regular rhythm.     Pulses: Normal pulses.     Heart sounds: Normal heart sounds.  Pulmonary:     Effort: Pulmonary effort is normal.     Breath sounds: Normal breath sounds.  Musculoskeletal:        General: Normal range of motion.     Cervical back: Normal range of motion.  Skin:    General: Skin is warm and dry.     Capillary Refill: Capillary refill takes less than 2 seconds.     Findings: Erythema and lesion present.          Comments: 4.5 cm round cyst to L upper shoulder; no exudate. TTP. No circumferential redness. Pt denies fevers or chills. Reports hx of "staph >45+ yrs ago"  Neurological:     General: No focal deficit present.     Mental Status: He is alert and oriented to person, place, and time. Mental status is at baseline.  Psychiatric:        Mood and Affect: Mood normal.        Behavior: Behavior normal.        Thought Content: Thought content normal.        Judgment: Judgment normal.     No results found for any visits on 07/20/23.  Assessment & Plan     Problem List Items Addressed This Visit       Musculoskeletal and Integument   Back skin lesion - Primary   Acute, self limiting Tx with once daily bactrim x 1 week given know CKD on iHD Referral to surg to assist Discussed supportive care and emergent care measures  Pt voiced understanding      Relevant Orders   Ambulatory referral to General Surgery   Return if symptoms worsen or fail to improve.     Leilani Merl, FNP, have reviewed all documentation for this visit. The documentation on 07/20/23 for the exam, diagnosis, procedures,  and orders are all accurate and complete.  Jacky Kindle, FNP  [facility] [phone] (phone) [phone] (fax)  [facility]

## 2023-07-20 NOTE — Patient Instructions (Signed)
Seek referral at surgical office Continue supportive care including warm compresses and drying agents like zinc creams Seek emergent care if febrile

## 2023-07-20 NOTE — Assessment & Plan Note (Signed)
Acute, self limiting Tx with once daily bactrim x 1 week given know CKD on iHD Referral to surg to assist Discussed supportive care and emergent care measures  Pt voiced understanding

## 2023-07-21 ENCOUNTER — Ambulatory Visit: Payer: Medicare HMO | Admitting: Family Medicine

## 2023-07-21 DIAGNOSIS — Z992 Dependence on renal dialysis: Secondary | ICD-10-CM | POA: Diagnosis not present

## 2023-07-21 DIAGNOSIS — N2581 Secondary hyperparathyroidism of renal origin: Secondary | ICD-10-CM | POA: Diagnosis not present

## 2023-07-21 DIAGNOSIS — N186 End stage renal disease: Secondary | ICD-10-CM | POA: Diagnosis not present

## 2023-07-22 ENCOUNTER — Ambulatory Visit: Payer: Medicare HMO | Admitting: General Surgery

## 2023-07-22 VITALS — BP 108/68 | HR 76 | Temp 98.2°F | Wt 225.0 lb

## 2023-07-22 DIAGNOSIS — L02212 Cutaneous abscess of back [any part, except buttock]: Secondary | ICD-10-CM

## 2023-07-22 NOTE — Patient Instructions (Addendum)
We have removed a Cyst in our office today.  You will need to pack the area daily and change the top gauze as needed if it becomes saturated.  Tomorrow evening remove your dressing and packing and shower letting the warm soapy water run over the area. Rinse the area well, pat dry, and repack and dress the area.  If the area continues to bleed hold hard pressure over it for 5 minutes to stop the bleeding.   You may use Ibuprofen or Tylenol as needed for pain control. Use the ice pack 3-4 times a day for the next two days for any achiness.  You may shower tomorrow, do not scrub at the area.  Avoid Strenuous activities that will make you sweat during the next 48 hours to avoid the glue coming off prematurely. Avoid activities that will place pressure to this area of the body for 1-2 weeks to avoid re-injury to incision site.  Please see your follow-up appointment provided. We will see you back in office to make sure this area is healed and to review the final pathology. If you have any questions or concerns prior to this appointment, call our office and speak with a nurse.    Excision of Skin Cysts or Lesions Excision of a skin lesion refers to the removal of a section of skin by making small cuts (incisions) in the skin. This procedure may be done to remove a cancerous (malignant) or noncancerous (benign) growth on the skin. It is typically done to treat or prevent cancer or infection. It may also be done to improve cosmetic appearance. The procedure may be done to remove: Cancerous growths, such as basal cell carcinoma, squamous cell carcinoma, or melanoma. Noncancerous growths, such as a cyst or lipoma. Growths, such as moles or skin tags, which may be removed for cosmetic reasons.  Various excision or surgical techniques may be used depending on your condition, the location of the lesion, and your overall health. Tell a health care provider about: Any allergies you have. All medicines you  are taking, including vitamins, herbs, eye drops, creams, and over-the-counter medicines. Any problems you or family members have had with anesthetic medicines. Any blood disorders you have. Any surgeries you have had. Any medical conditions you have. Whether you are pregnant or may be pregnant. What are the risks? Generally, this is a safe procedure. However, problems may occur, including: Bleeding. Infection. Scarring. Recurrence of the cyst, lipoma, or cancer. Changes in skin sensation or appearance, such as discoloration or swelling. Reaction to the anesthetics. Allergic reaction to surgical materials or ointments. Damage to nerves, blood vessels, muscles, or other structures. Continued pain.  What happens before the procedure? Ask your health care provider about: Changing or stopping your regular medicines. This is especially important if you are taking diabetes medicines or blood thinners. Taking medicines such as aspirin and ibuprofen. These medicines can thin your blood. Do not take these medicines before your procedure if your health care provider instructs you not to. You may be asked to take certain medicines. You may be asked to stop smoking. You may have an exam or testing. What happens during the procedure? To reduce your risk of infection: Your health care team will wash or sanitize their hands. Your skin will be washed with soap. You will be given a medicine to numb the area (local anesthetic). One of the following excision techniques will be performed. At the end of any of these procedures, antibiotic ointment will be applied  as needed. Each of the following techniques may vary among health care providers and hospitals. Complete Surgical Excision The area of skin that needs to be removed will be marked with a pen. Using a small scalpel or scissors, the surgeon will gently cut around and under the lesion until it is completely removed. The lesion will be placed in a  fluid and sent to the lab for examination. If necessary, bleeding will be controlled with a device that delivers heat (electrocautery). The edges of the wound may be stitched (sutured) together, and a bandage (dressing) or surgical glue will be applied. This procedure may be performed to treat a cancerous growth or a noncancerous cyst or lesion.  What happens after the procedure? Return to your normal activities as told by your health care provider. Report any excessive bleeding, spreading redness, or increased pain.

## 2023-07-23 DIAGNOSIS — N2581 Secondary hyperparathyroidism of renal origin: Secondary | ICD-10-CM | POA: Diagnosis not present

## 2023-07-23 DIAGNOSIS — Z992 Dependence on renal dialysis: Secondary | ICD-10-CM | POA: Diagnosis not present

## 2023-07-23 DIAGNOSIS — N186 End stage renal disease: Secondary | ICD-10-CM | POA: Diagnosis not present

## 2023-07-26 DIAGNOSIS — N186 End stage renal disease: Secondary | ICD-10-CM | POA: Diagnosis not present

## 2023-07-26 DIAGNOSIS — N2581 Secondary hyperparathyroidism of renal origin: Secondary | ICD-10-CM | POA: Diagnosis not present

## 2023-07-26 DIAGNOSIS — Z992 Dependence on renal dialysis: Secondary | ICD-10-CM | POA: Diagnosis not present

## 2023-07-26 NOTE — Progress Notes (Signed)
Patient ID: Jeremy Dorsey, male   DOB: 07-17-1951, 72 y.o.   MRN: [id number] CC: Back abscess History of Present Illness Jeremy Dorsey is a 72 y.o. male with past medical history as below who presents for back abscess.  The patient reports that over the last several days he has noticed a swelling and pain in his back.  He denies any drainage from it but does says that it has been red around there.  He denies any fevers or chills.  She denies any nausea or vomiting.  Of note, the patient takes aspirin and Plavix.  Past Medical History Past Medical History:  Diagnosis Date   Anemia    Cancer (HCC)    basal cell nose and forehead   Cataract 2016   Cataract surgery bith eyes   CKD (chronic kidney disease), stage IV (HCC)    Coronary artery disease    a. s/p IVUS-guided DESx2 to prox & mid LAD, residual disease treated medically. EF 50-55% by recent echo 02/2020.   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   Urinary complication        Past Surgical History:  Procedure Laterality Date   ABDOMINAL AORTOGRAM W/LOWER EXTREMITY N/A 01/29/2022   Procedure: ABDOMINAL AORTOGRAM W/ Bilateral LOWER EXTREMITY [place];  Surgeon: Cephus Shelling, MD;  Location: [facility];  Service: Cardiovascular;  Laterality: N/A;   ABDOMINAL AORTOGRAM W/LOWER EXTREMITY N/A 06/11/2022   Procedure: ABDOMINAL AORTOGRAM W/LOWER EXTREMITY;  Surgeon: Cephus Shelling, MD;  Location: [facility];  Service: Cardiovascular;  Laterality: N/A;   AV FISTULA PLACEMENT Left 10/31/2020   Procedure: LEFT UPPER EXTREMITY ARTERIOVENOUS (AV) FISTULA CREATION;  Surgeon: Maeola Harman, MD;  Location: [facility] OR;  Service: Vascular;  Laterality: Left;   AV FISTULA PLACEMENT Left 07/09/2021   Procedure: LEFT ARM ARTERIOVENOUS (AV) FISTULA CREATION;   Surgeon: Nada Libman, MD;  Location: [facility] OR;  Service: Vascular;  Laterality: Left;   COLONOSCOPY  1990   COLONOSCOPY  06/09/2011   Dr Lemar Livings   CORONARY STENT INTERVENTION N/A 04/24/2020   Procedure: CORONARY STENT INTERVENTION;  Surgeon: Iran Ouch, MD;  Location: [facility];  Service: Cardiovascular;  Laterality: N/A;   CORONARY ULTRASOUND/IVUS N/A 04/24/2020   Procedure: Intravascular Ultrasound/IVUS;  Surgeon: Iran Ouch, MD;  Location: [facility];  Service: Cardiovascular;  Laterality: N/A;   CYSTOSCOPY W/ RETROGRADES Bilateral 08/01/2018   Procedure: CYSTOSCOPY WITH RETROGRADE PYELOGRAM;  Surgeon: Sondra Come, MD;  Location: [facility] ORS;  Service: Urology;  Laterality: Bilateral;   CYSTOSCOPY WITH BIOPSY N/A 08/01/2018   Procedure: CYSTOSCOPY WITH Bladder BIOPSY;  Surgeon: Sondra Come, MD;  Location: [facility] OR[facility];  Service: Urology;  Laterality: N/A;   CYSTOSCOPY WITH STENT PLACEMENT Right 08/01/2018   Procedure: CYSTOSCOPY WITH STENT PLACEMENT;  Surgeon: Sondra Come, MD;  Location: [facility] OR[facility];  Service: Urology;  Laterality: Right;   EYE SURGERY Right    laser surgery   FRACTURE SURGERY     Rt shoulder rotaor cuff   IR THROMBECTOMY AV FISTULA W/THROMBOLYSIS/PTA INC/SHUNT/IMG LEFT Left 06/22/2023   IR US GUIDE VASC ACCESS LEFT  06/22/2023   LEFT HEART CATH AND CORONARY ANGIOGRAPHY N/A 04/24/2020   Procedure: LEFT HEART CATH AND  CORONARY ANGIOGRAPHY;  Surgeon: Iran Ouch, MD;  [facility]: [facility];  Service: Cardiovascular;  Laterality: N/A;   LEFT HEART CATH AND CORONARY ANGIOGRAPHY Left 04/16/2023   Procedure: LEFT HEART CATH AND CORONARY ANGIOGRAPHY;  Surgeon: Iran Ouch, MD;  Location: [facility];  Service: Cardiovascular;  Laterality: Left;   PERIPHERAL VASCULAR BALLOON ANGIOPLASTY Right 01/29/2022   Procedure: PERIPHERAL VASCULAR BALLOON ANGIOPLASTY;  Surgeon: Cephus Shelling, MD;  [facility];  Service: Cardiovascular;  Laterality: Right;   PERIPHERAL VASCULAR BALLOON ANGIOPLASTY Right 06/11/2022   Procedure: PERIPHERAL VASCULAR BALLOON ANGIOPLASTY;  Surgeon: Cephus Shelling, MD;  Location: [facility];  Service: Cardiovascular;  Laterality: Right;  Peroneal   SHOULDER ARTHROSCOPY WITH OPEN ROTATOR CUFF REPAIR Right 08/25/2018   Procedure: SHOULDER ARTHROSCOPY WITH MINI OPEN ROTATOR CUFF REPAIR;  Surgeon: Juanell Fairly, MD;  Location: [facility] OR[facility];  Service: Orthopedics;  Laterality: Right;   TRANSURETHRAL RESECTION OF BLADDER TUMOR N/A 08/01/2018   Procedure: TRANSURETHRAL RESECTION OF BLADDER TUMOR (TURBT);  Surgeon: Sondra Come, MD;  Location: [facility];  Service: Urology;  Laterality: N/A;   URETEROSCOPY WITH HOLMIUM LASER LITHOTRIPSY Right 08/01/2018   Procedure: URETEROSCOPY WITH HOLMIUM LASER LITHOTRIPSY;  Surgeon: Sondra Come, MD;  Location: [facility] ORS;  Service: Urology;  Laterality: Right;    Allergies  Allergen Reactions   Oxycodone Nausea And Vomiting   Hydrocodone Nausea And Vomiting    Current Outpatient Medications  Medication Sig Dispense Refill   amLODipine (NORVASC) 5 MG tablet Take 5 mg by mouth in the morning.     aspirin EC (ASPIRIN LOW DOSE) 81 MG tablet TAKE 1 TABLET BY MOUTH EVERY MORNING (SWALLOW WHOLE) (Patient taking differently: Take 81 mg by mouth daily.) 30 tablet 1   atorvastatin (LIPITOR) 80 MG tablet TAKE 1 TABLET BY MOUTH EVERY DAY 90 tablet 2   AURYXIA 1 GM 210 MG(Fe) tablet Take by mouth.     calcitRIOL (ROCALTROL) 0.25 MCG capsule Take 0.25 mcg by mouth in the morning.     carvedilol (COREG) 12.5 MG tablet Take 1 tablet by mouth 2 (two) times daily.     cinacalcet (SENSIPAR) 30 MG tablet Take 30 mg by mouth every Monday, Wednesday, and Friday.     clopidogrel (PLAVIX) 75 MG tablet TAKE 1 TABLET BY MOUTH EVERY DAY 90 tablet 2   lidocaine-prilocaine (EMLA) cream Apply 1 Application topically every Monday, Wednesday,  and Friday.     Methoxy PEG-Epoetin Beta (MIRCERA IJ) Inject 1 each into the skin See admin instructions. During Dialysis     multivitamin (RENA-VIT) TABS tablet Take 1 tablet by mouth daily.     nitroGLYCERIN (NITROSTAT) 0.4 MG SL tablet Place 1 tablet (0.4 mg total) under the tongue every 5 (five) minutes x 3 doses as needed for chest pain. 25 tablet 0   sevelamer carbonate (RENVELA) 800 MG tablet Take 800 mg by mouth with breakfast, with lunch, and with evening meal.     sildenafil (VIAGRA) 100 MG tablet Take 100 mg by mouth as needed for erectile dysfunction.     sulfamethoxazole-trimethoprim (BACTRIM DS) 800-160 MG tablet Take 1 tablet by mouth daily. 7 tablet 0   torsemide (DEMADEX) 100 MG tablet Take 100 mg by mouth every morning.     No current facility-administered medications for this visit.    Family History Family History  Problem Relation Age of Onset   Psoriasis Mother    Heart failure Father  Social History Social History   Tobacco Use   Smoking status: Former    Current packs/day: 0.00    Average packs/day: 1 pack/day for 30.0 years (30.0 ttl pk-yrs)    Types: Cigarettes    Start date: 08/22/1977    Quit date: 08/23/2007    Years since quitting: 15.9    Passive exposure: Past   Smokeless tobacco: Never  Vaping Use   Vaping status: Never Used  Substance Use Topics   Alcohol use: No   Drug use: No        ROS Full ROS of systems performed and is otherwise negative there than what is stated in the HPI  Physical Exam Blood pressure 108/68, pulse 76, temperature 98.2 F (36.8 C), weight 225 lb (102.1 kg), SpO2 98%.  Normal work of breathing on room air, alert and oriented x 3, PERRLA, moving all extremity spontaneously, on his back he has a abscess that is fluctuant with surrounding erythema. Data Reviewed  Patient last A1c reviewed and it was in the sixes. I have personally reviewed the patient's imaging and medical records.    Assessment     Patient with back abscess.  Will I&D it in office today.  I discussed the risk, benefits alternatives of the procedure including risk of infection, bleeding especially given he is on dual antiplatelet therapy and recurrence of the abscess.  Please see below procedure note  Informed consent was obtained by the patient.  The back was then cleansed with chlorhexidine prep.  Patient comfort was obtained with 1% lidocaine.  An incision was made in the most fluctuant part of the abscess.  This was taken down into the abscess cavity with immediate expulsion of purulent material.  Samples of the material were collected and sent for culture.  All loculations in the abscess were broken up bluntly and with a hemostat.  The wound was packed with iodoform gauze packing and covered with gauze and tape.    Kandis Cocking 07/26/2023, 8:13 AM

## 2023-07-29 DIAGNOSIS — N186 End stage renal disease: Secondary | ICD-10-CM | POA: Diagnosis not present

## 2023-07-29 DIAGNOSIS — Z992 Dependence on renal dialysis: Secondary | ICD-10-CM | POA: Diagnosis not present

## 2023-07-29 DIAGNOSIS — N2581 Secondary hyperparathyroidism of renal origin: Secondary | ICD-10-CM | POA: Diagnosis not present

## 2023-07-29 LAB — ANAEROBIC AND AEROBIC CULTURE

## 2023-07-31 DIAGNOSIS — N186 End stage renal disease: Secondary | ICD-10-CM | POA: Diagnosis not present

## 2023-07-31 DIAGNOSIS — Z992 Dependence on renal dialysis: Secondary | ICD-10-CM | POA: Diagnosis not present

## 2023-07-31 DIAGNOSIS — N2581 Secondary hyperparathyroidism of renal origin: Secondary | ICD-10-CM | POA: Diagnosis not present

## 2023-08-02 DIAGNOSIS — N2581 Secondary hyperparathyroidism of renal origin: Secondary | ICD-10-CM | POA: Diagnosis not present

## 2023-08-02 DIAGNOSIS — N186 End stage renal disease: Secondary | ICD-10-CM | POA: Diagnosis not present

## 2023-08-02 DIAGNOSIS — Z992 Dependence on renal dialysis: Secondary | ICD-10-CM | POA: Diagnosis not present

## 2023-08-03 DIAGNOSIS — I129 Hypertensive chronic kidney disease with stage 1 through stage 4 chronic kidney disease, or unspecified chronic kidney disease: Secondary | ICD-10-CM | POA: Diagnosis not present

## 2023-08-03 DIAGNOSIS — N186 End stage renal disease: Secondary | ICD-10-CM | POA: Diagnosis not present

## 2023-08-03 DIAGNOSIS — Z992 Dependence on renal dialysis: Secondary | ICD-10-CM | POA: Diagnosis not present

## 2023-08-04 DIAGNOSIS — Z4931 Encounter for adequacy testing for hemodialysis: Secondary | ICD-10-CM | POA: Diagnosis not present

## 2023-08-04 DIAGNOSIS — Z992 Dependence on renal dialysis: Secondary | ICD-10-CM | POA: Diagnosis not present

## 2023-08-04 DIAGNOSIS — D631 Anemia in chronic kidney disease: Secondary | ICD-10-CM | POA: Diagnosis not present

## 2023-08-04 DIAGNOSIS — N184 Chronic kidney disease, stage 4 (severe): Secondary | ICD-10-CM | POA: Diagnosis not present

## 2023-08-04 DIAGNOSIS — D509 Iron deficiency anemia, unspecified: Secondary | ICD-10-CM | POA: Diagnosis not present

## 2023-08-04 DIAGNOSIS — N2581 Secondary hyperparathyroidism of renal origin: Secondary | ICD-10-CM | POA: Diagnosis not present

## 2023-08-04 DIAGNOSIS — N186 End stage renal disease: Secondary | ICD-10-CM | POA: Diagnosis not present

## 2023-08-04 DIAGNOSIS — Z79899 Other long term (current) drug therapy: Secondary | ICD-10-CM | POA: Diagnosis not present

## 2023-08-04 DIAGNOSIS — E1122 Type 2 diabetes mellitus with diabetic chronic kidney disease: Secondary | ICD-10-CM | POA: Diagnosis not present

## 2023-08-05 ENCOUNTER — Ambulatory Visit: Payer: Medicare Other | Admitting: General Surgery

## 2023-08-06 ENCOUNTER — Other Ambulatory Visit: Payer: Self-pay | Admitting: Family Medicine

## 2023-08-06 DIAGNOSIS — N2581 Secondary hyperparathyroidism of renal origin: Secondary | ICD-10-CM | POA: Diagnosis not present

## 2023-08-06 DIAGNOSIS — N184 Chronic kidney disease, stage 4 (severe): Secondary | ICD-10-CM | POA: Diagnosis not present

## 2023-08-06 DIAGNOSIS — Z4931 Encounter for adequacy testing for hemodialysis: Secondary | ICD-10-CM | POA: Diagnosis not present

## 2023-08-06 DIAGNOSIS — N186 End stage renal disease: Secondary | ICD-10-CM | POA: Diagnosis not present

## 2023-08-06 DIAGNOSIS — E1122 Type 2 diabetes mellitus with diabetic chronic kidney disease: Secondary | ICD-10-CM | POA: Diagnosis not present

## 2023-08-06 DIAGNOSIS — D509 Iron deficiency anemia, unspecified: Secondary | ICD-10-CM | POA: Diagnosis not present

## 2023-08-06 DIAGNOSIS — D631 Anemia in chronic kidney disease: Secondary | ICD-10-CM | POA: Diagnosis not present

## 2023-08-06 DIAGNOSIS — Z79899 Other long term (current) drug therapy: Secondary | ICD-10-CM | POA: Diagnosis not present

## 2023-08-06 DIAGNOSIS — Z992 Dependence on renal dialysis: Secondary | ICD-10-CM | POA: Diagnosis not present

## 2023-08-09 DIAGNOSIS — E1122 Type 2 diabetes mellitus with diabetic chronic kidney disease: Secondary | ICD-10-CM | POA: Diagnosis not present

## 2023-08-09 DIAGNOSIS — Z79899 Other long term (current) drug therapy: Secondary | ICD-10-CM | POA: Diagnosis not present

## 2023-08-09 DIAGNOSIS — N2581 Secondary hyperparathyroidism of renal origin: Secondary | ICD-10-CM | POA: Diagnosis not present

## 2023-08-09 DIAGNOSIS — Z992 Dependence on renal dialysis: Secondary | ICD-10-CM | POA: Diagnosis not present

## 2023-08-09 DIAGNOSIS — Z4931 Encounter for adequacy testing for hemodialysis: Secondary | ICD-10-CM | POA: Diagnosis not present

## 2023-08-09 DIAGNOSIS — N184 Chronic kidney disease, stage 4 (severe): Secondary | ICD-10-CM | POA: Diagnosis not present

## 2023-08-09 DIAGNOSIS — N186 End stage renal disease: Secondary | ICD-10-CM | POA: Diagnosis not present

## 2023-08-09 DIAGNOSIS — D509 Iron deficiency anemia, unspecified: Secondary | ICD-10-CM | POA: Diagnosis not present

## 2023-08-09 DIAGNOSIS — D631 Anemia in chronic kidney disease: Secondary | ICD-10-CM | POA: Diagnosis not present

## 2023-08-09 MED ORDER — CARVEDILOL 12.5 MG PO TABS: 12.5000 mg | ORAL_TABLET | Freq: Two times a day (BID) | ORAL | 1 refills | Status: DC

## 2023-08-09 NOTE — Telephone Encounter (Signed)
 [facility] [employer]. I called patient to confirm which pharmacy he wants this sent. [facility] [address] [address].

## 2023-08-11 DIAGNOSIS — Z992 Dependence on renal dialysis: Secondary | ICD-10-CM | POA: Diagnosis not present

## 2023-08-11 DIAGNOSIS — N2581 Secondary hyperparathyroidism of renal origin: Secondary | ICD-10-CM | POA: Diagnosis not present

## 2023-08-11 DIAGNOSIS — Z4931 Encounter for adequacy testing for hemodialysis: Secondary | ICD-10-CM | POA: Diagnosis not present

## 2023-08-11 DIAGNOSIS — N184 Chronic kidney disease, stage 4 (severe): Secondary | ICD-10-CM | POA: Diagnosis not present

## 2023-08-11 DIAGNOSIS — N186 End stage renal disease: Secondary | ICD-10-CM | POA: Diagnosis not present

## 2023-08-11 DIAGNOSIS — D509 Iron deficiency anemia, unspecified: Secondary | ICD-10-CM | POA: Diagnosis not present

## 2023-08-11 DIAGNOSIS — D631 Anemia in chronic kidney disease: Secondary | ICD-10-CM | POA: Diagnosis not present

## 2023-08-11 DIAGNOSIS — Z79899 Other long term (current) drug therapy: Secondary | ICD-10-CM | POA: Diagnosis not present

## 2023-08-11 DIAGNOSIS — E1122 Type 2 diabetes mellitus with diabetic chronic kidney disease: Secondary | ICD-10-CM | POA: Diagnosis not present

## 2023-08-13 ENCOUNTER — Other Ambulatory Visit: Payer: Self-pay | Admitting: Orthopedic Surgery

## 2023-08-13 DIAGNOSIS — Z79899 Other long term (current) drug therapy: Secondary | ICD-10-CM | POA: Diagnosis not present

## 2023-08-13 DIAGNOSIS — N2581 Secondary hyperparathyroidism of renal origin: Secondary | ICD-10-CM | POA: Diagnosis not present

## 2023-08-13 DIAGNOSIS — D631 Anemia in chronic kidney disease: Secondary | ICD-10-CM | POA: Diagnosis not present

## 2023-08-13 DIAGNOSIS — Z992 Dependence on renal dialysis: Secondary | ICD-10-CM | POA: Diagnosis not present

## 2023-08-13 DIAGNOSIS — N184 Chronic kidney disease, stage 4 (severe): Secondary | ICD-10-CM | POA: Diagnosis not present

## 2023-08-13 DIAGNOSIS — Z4931 Encounter for adequacy testing for hemodialysis: Secondary | ICD-10-CM | POA: Diagnosis not present

## 2023-08-13 DIAGNOSIS — E1122 Type 2 diabetes mellitus with diabetic chronic kidney disease: Secondary | ICD-10-CM | POA: Diagnosis not present

## 2023-08-13 DIAGNOSIS — D509 Iron deficiency anemia, unspecified: Secondary | ICD-10-CM | POA: Diagnosis not present

## 2023-08-13 DIAGNOSIS — N186 End stage renal disease: Secondary | ICD-10-CM | POA: Diagnosis not present

## 2023-08-16 ENCOUNTER — Ambulatory Visit: Payer: Medicare HMO | Admitting: Cardiovascular Disease

## 2023-08-16 DIAGNOSIS — D631 Anemia in chronic kidney disease: Secondary | ICD-10-CM | POA: Diagnosis not present

## 2023-08-16 DIAGNOSIS — N186 End stage renal disease: Secondary | ICD-10-CM | POA: Diagnosis not present

## 2023-08-16 DIAGNOSIS — Z992 Dependence on renal dialysis: Secondary | ICD-10-CM | POA: Diagnosis not present

## 2023-08-16 DIAGNOSIS — N2581 Secondary hyperparathyroidism of renal origin: Secondary | ICD-10-CM | POA: Diagnosis not present

## 2023-08-16 DIAGNOSIS — D509 Iron deficiency anemia, unspecified: Secondary | ICD-10-CM | POA: Diagnosis not present

## 2023-08-16 DIAGNOSIS — E1122 Type 2 diabetes mellitus with diabetic chronic kidney disease: Secondary | ICD-10-CM | POA: Diagnosis not present

## 2023-08-16 DIAGNOSIS — Z79899 Other long term (current) drug therapy: Secondary | ICD-10-CM | POA: Diagnosis not present

## 2023-08-16 DIAGNOSIS — N184 Chronic kidney disease, stage 4 (severe): Secondary | ICD-10-CM | POA: Diagnosis not present

## 2023-08-16 DIAGNOSIS — Z4931 Encounter for adequacy testing for hemodialysis: Secondary | ICD-10-CM | POA: Diagnosis not present

## 2023-08-17 ENCOUNTER — Other Ambulatory Visit: Payer: Self-pay | Admitting: Cardiovascular Disease

## 2023-08-17 DIAGNOSIS — I25118 Atherosclerotic heart disease of native coronary artery with other forms of angina pectoris: Secondary | ICD-10-CM

## 2023-08-18 DIAGNOSIS — N186 End stage renal disease: Secondary | ICD-10-CM | POA: Diagnosis not present

## 2023-08-18 DIAGNOSIS — E1122 Type 2 diabetes mellitus with diabetic chronic kidney disease: Secondary | ICD-10-CM | POA: Diagnosis not present

## 2023-08-18 DIAGNOSIS — D631 Anemia in chronic kidney disease: Secondary | ICD-10-CM | POA: Diagnosis not present

## 2023-08-18 DIAGNOSIS — N184 Chronic kidney disease, stage 4 (severe): Secondary | ICD-10-CM | POA: Diagnosis not present

## 2023-08-18 DIAGNOSIS — Z4931 Encounter for adequacy testing for hemodialysis: Secondary | ICD-10-CM | POA: Diagnosis not present

## 2023-08-18 DIAGNOSIS — Z992 Dependence on renal dialysis: Secondary | ICD-10-CM | POA: Diagnosis not present

## 2023-08-18 DIAGNOSIS — Z79899 Other long term (current) drug therapy: Secondary | ICD-10-CM | POA: Diagnosis not present

## 2023-08-18 DIAGNOSIS — D509 Iron deficiency anemia, unspecified: Secondary | ICD-10-CM | POA: Diagnosis not present

## 2023-08-18 DIAGNOSIS — N2581 Secondary hyperparathyroidism of renal origin: Secondary | ICD-10-CM | POA: Diagnosis not present

## 2023-08-19 ENCOUNTER — Telehealth: Payer: Self-pay | Admitting: Cardiovascular Disease

## 2023-08-19 DIAGNOSIS — D631 Anemia in chronic kidney disease: Secondary | ICD-10-CM | POA: Diagnosis not present

## 2023-08-19 DIAGNOSIS — Z4931 Encounter for adequacy testing for hemodialysis: Secondary | ICD-10-CM | POA: Diagnosis not present

## 2023-08-19 DIAGNOSIS — E1122 Type 2 diabetes mellitus with diabetic chronic kidney disease: Secondary | ICD-10-CM | POA: Diagnosis not present

## 2023-08-19 DIAGNOSIS — N2581 Secondary hyperparathyroidism of renal origin: Secondary | ICD-10-CM | POA: Diagnosis not present

## 2023-08-19 DIAGNOSIS — N186 End stage renal disease: Secondary | ICD-10-CM | POA: Diagnosis not present

## 2023-08-19 DIAGNOSIS — Z992 Dependence on renal dialysis: Secondary | ICD-10-CM | POA: Diagnosis not present

## 2023-08-19 DIAGNOSIS — Z79899 Other long term (current) drug therapy: Secondary | ICD-10-CM | POA: Diagnosis not present

## 2023-08-19 DIAGNOSIS — D509 Iron deficiency anemia, unspecified: Secondary | ICD-10-CM | POA: Diagnosis not present

## 2023-08-19 DIAGNOSIS — N184 Chronic kidney disease, stage 4 (severe): Secondary | ICD-10-CM | POA: Diagnosis not present

## 2023-08-19 NOTE — Telephone Encounter (Signed)
   Pre-operative Risk Assessment    Patient Name: Jeremy Dorsey  DOB: August 15, 1950 MRN: [id number]      Request for Surgical Clearance    Procedure:   Rt carpal tunnnel release  Date of Surgery:  Clearance 10/07/23                                 Surgeon:  Dr  Volney [employer] or Practice Name:  [facility] Phone number:  [phone]  Fax number:  [phone]   Type of Clearance Requested:   - Medical  - Pharmacy:  Hold Clopidogrel (Plavix) def cards   Type of Anesthesia:   Choice   Additional requests/questions:    Nilda Riggs   08/19/2023, 10:46 AM

## 2023-08-20 DIAGNOSIS — Z992 Dependence on renal dialysis: Secondary | ICD-10-CM | POA: Diagnosis not present

## 2023-08-20 DIAGNOSIS — D631 Anemia in chronic kidney disease: Secondary | ICD-10-CM | POA: Diagnosis not present

## 2023-08-20 DIAGNOSIS — D509 Iron deficiency anemia, unspecified: Secondary | ICD-10-CM | POA: Diagnosis not present

## 2023-08-20 DIAGNOSIS — N186 End stage renal disease: Secondary | ICD-10-CM | POA: Diagnosis not present

## 2023-08-20 DIAGNOSIS — N2581 Secondary hyperparathyroidism of renal origin: Secondary | ICD-10-CM | POA: Diagnosis not present

## 2023-08-20 DIAGNOSIS — N184 Chronic kidney disease, stage 4 (severe): Secondary | ICD-10-CM | POA: Diagnosis not present

## 2023-08-20 DIAGNOSIS — Z4931 Encounter for adequacy testing for hemodialysis: Secondary | ICD-10-CM | POA: Diagnosis not present

## 2023-08-20 DIAGNOSIS — E1122 Type 2 diabetes mellitus with diabetic chronic kidney disease: Secondary | ICD-10-CM | POA: Diagnosis not present

## 2023-08-20 DIAGNOSIS — Z79899 Other long term (current) drug therapy: Secondary | ICD-10-CM | POA: Diagnosis not present

## 2023-08-20 NOTE — Telephone Encounter (Signed)
Good Morning Dr. Mariah Milling  We have received a surgical clearance request for Jeremy Dorsey for upcoming carpal tunnel release scheduled for 10/07/2023.. They were seen recently in clinic on 06/10/2023.  He has a PMH ofCAD with NSTEMI in 02/2020 with subsequent PCI to the LAD in 04/2020, ESRD on HD, DM2, PAD.  Per our protocol guidance on holding Plavix will have to come from primary cardiologist.  Can you please comment on surgical clearance and guidance on holding Plavix prior to carpal tunnel release. Please forward you guidance and recommendations to [facility] PREOP   Thank you, Robin Searing, NP

## 2023-08-20 NOTE — Telephone Encounter (Signed)
   Patient Name: YECHIEL MYKLEBUST  DOB: 05/22/1951 MRN: [id number]  Primary Cardiologist: Julien Nordmann, MD  Chart reviewed as part of pre-operative protocol coverage. Given past medical history and time since last visit, based on ACC/AHA guidelines, MARLOW GUARD is at acceptable risk for the planned procedure without further cardiovascular testing.   The patient was advised that if he develops new symptoms prior to surgery to contact our office to arrange for a follow-up visit, and he verbalized understanding.  Per Dr. Mariah Milling patient can hold Plavix 5 days prior to procedure and should continue ASA 81 mg through the perioperative period.  I will route this recommendation to the requesting party via Epic fax function and remove from pre-op pool.  Please call with questions.  Napoleon Form, Leodis Rains, NP 08/20/2023, 1:40 PM

## 2023-08-23 DIAGNOSIS — N184 Chronic kidney disease, stage 4 (severe): Secondary | ICD-10-CM | POA: Diagnosis not present

## 2023-08-23 DIAGNOSIS — D631 Anemia in chronic kidney disease: Secondary | ICD-10-CM | POA: Diagnosis not present

## 2023-08-23 DIAGNOSIS — Z992 Dependence on renal dialysis: Secondary | ICD-10-CM | POA: Diagnosis not present

## 2023-08-23 DIAGNOSIS — Z79899 Other long term (current) drug therapy: Secondary | ICD-10-CM | POA: Diagnosis not present

## 2023-08-23 DIAGNOSIS — E1122 Type 2 diabetes mellitus with diabetic chronic kidney disease: Secondary | ICD-10-CM | POA: Diagnosis not present

## 2023-08-23 DIAGNOSIS — Z4931 Encounter for adequacy testing for hemodialysis: Secondary | ICD-10-CM | POA: Diagnosis not present

## 2023-08-23 DIAGNOSIS — N186 End stage renal disease: Secondary | ICD-10-CM | POA: Diagnosis not present

## 2023-08-23 DIAGNOSIS — D509 Iron deficiency anemia, unspecified: Secondary | ICD-10-CM | POA: Diagnosis not present

## 2023-08-23 DIAGNOSIS — N2581 Secondary hyperparathyroidism of renal origin: Secondary | ICD-10-CM | POA: Diagnosis not present

## 2023-08-25 DIAGNOSIS — N184 Chronic kidney disease, stage 4 (severe): Secondary | ICD-10-CM | POA: Diagnosis not present

## 2023-08-25 DIAGNOSIS — D509 Iron deficiency anemia, unspecified: Secondary | ICD-10-CM | POA: Diagnosis not present

## 2023-08-25 DIAGNOSIS — Z79899 Other long term (current) drug therapy: Secondary | ICD-10-CM | POA: Diagnosis not present

## 2023-08-25 DIAGNOSIS — E1122 Type 2 diabetes mellitus with diabetic chronic kidney disease: Secondary | ICD-10-CM | POA: Diagnosis not present

## 2023-08-25 DIAGNOSIS — Z992 Dependence on renal dialysis: Secondary | ICD-10-CM | POA: Diagnosis not present

## 2023-08-25 DIAGNOSIS — N2581 Secondary hyperparathyroidism of renal origin: Secondary | ICD-10-CM | POA: Diagnosis not present

## 2023-08-25 DIAGNOSIS — D631 Anemia in chronic kidney disease: Secondary | ICD-10-CM | POA: Diagnosis not present

## 2023-08-25 DIAGNOSIS — N186 End stage renal disease: Secondary | ICD-10-CM | POA: Diagnosis not present

## 2023-08-25 DIAGNOSIS — Z4931 Encounter for adequacy testing for hemodialysis: Secondary | ICD-10-CM | POA: Diagnosis not present

## 2023-08-27 DIAGNOSIS — Z4931 Encounter for adequacy testing for hemodialysis: Secondary | ICD-10-CM | POA: Diagnosis not present

## 2023-08-27 DIAGNOSIS — D509 Iron deficiency anemia, unspecified: Secondary | ICD-10-CM | POA: Diagnosis not present

## 2023-08-27 DIAGNOSIS — N186 End stage renal disease: Secondary | ICD-10-CM | POA: Diagnosis not present

## 2023-08-27 DIAGNOSIS — Z992 Dependence on renal dialysis: Secondary | ICD-10-CM | POA: Diagnosis not present

## 2023-08-27 DIAGNOSIS — Z79899 Other long term (current) drug therapy: Secondary | ICD-10-CM | POA: Diagnosis not present

## 2023-08-27 DIAGNOSIS — E1122 Type 2 diabetes mellitus with diabetic chronic kidney disease: Secondary | ICD-10-CM | POA: Diagnosis not present

## 2023-08-27 DIAGNOSIS — N184 Chronic kidney disease, stage 4 (severe): Secondary | ICD-10-CM | POA: Diagnosis not present

## 2023-08-27 DIAGNOSIS — N2581 Secondary hyperparathyroidism of renal origin: Secondary | ICD-10-CM | POA: Diagnosis not present

## 2023-08-27 DIAGNOSIS — D631 Anemia in chronic kidney disease: Secondary | ICD-10-CM | POA: Diagnosis not present

## 2023-08-30 DIAGNOSIS — N2581 Secondary hyperparathyroidism of renal origin: Secondary | ICD-10-CM | POA: Diagnosis not present

## 2023-08-30 DIAGNOSIS — E1122 Type 2 diabetes mellitus with diabetic chronic kidney disease: Secondary | ICD-10-CM | POA: Diagnosis not present

## 2023-08-30 DIAGNOSIS — Z992 Dependence on renal dialysis: Secondary | ICD-10-CM | POA: Diagnosis not present

## 2023-08-30 DIAGNOSIS — D631 Anemia in chronic kidney disease: Secondary | ICD-10-CM | POA: Diagnosis not present

## 2023-08-30 DIAGNOSIS — D509 Iron deficiency anemia, unspecified: Secondary | ICD-10-CM | POA: Diagnosis not present

## 2023-08-30 DIAGNOSIS — Z4931 Encounter for adequacy testing for hemodialysis: Secondary | ICD-10-CM | POA: Diagnosis not present

## 2023-08-30 DIAGNOSIS — Z79899 Other long term (current) drug therapy: Secondary | ICD-10-CM | POA: Diagnosis not present

## 2023-08-30 DIAGNOSIS — N186 End stage renal disease: Secondary | ICD-10-CM | POA: Diagnosis not present

## 2023-08-30 DIAGNOSIS — N184 Chronic kidney disease, stage 4 (severe): Secondary | ICD-10-CM | POA: Diagnosis not present

## 2023-09-01 DIAGNOSIS — E1122 Type 2 diabetes mellitus with diabetic chronic kidney disease: Secondary | ICD-10-CM | POA: Diagnosis not present

## 2023-09-01 DIAGNOSIS — N184 Chronic kidney disease, stage 4 (severe): Secondary | ICD-10-CM | POA: Diagnosis not present

## 2023-09-01 DIAGNOSIS — Z4931 Encounter for adequacy testing for hemodialysis: Secondary | ICD-10-CM | POA: Diagnosis not present

## 2023-09-01 DIAGNOSIS — Z992 Dependence on renal dialysis: Secondary | ICD-10-CM | POA: Diagnosis not present

## 2023-09-01 DIAGNOSIS — D509 Iron deficiency anemia, unspecified: Secondary | ICD-10-CM | POA: Diagnosis not present

## 2023-09-01 DIAGNOSIS — D631 Anemia in chronic kidney disease: Secondary | ICD-10-CM | POA: Diagnosis not present

## 2023-09-01 DIAGNOSIS — N186 End stage renal disease: Secondary | ICD-10-CM | POA: Diagnosis not present

## 2023-09-01 DIAGNOSIS — N2581 Secondary hyperparathyroidism of renal origin: Secondary | ICD-10-CM | POA: Diagnosis not present

## 2023-09-01 DIAGNOSIS — Z79899 Other long term (current) drug therapy: Secondary | ICD-10-CM | POA: Diagnosis not present

## 2023-09-02 ENCOUNTER — Encounter: Payer: Self-pay | Admitting: Cardiovascular Disease

## 2023-09-03 DIAGNOSIS — Z4931 Encounter for adequacy testing for hemodialysis: Secondary | ICD-10-CM | POA: Diagnosis not present

## 2023-09-03 DIAGNOSIS — N186 End stage renal disease: Secondary | ICD-10-CM | POA: Diagnosis not present

## 2023-09-03 DIAGNOSIS — Z79899 Other long term (current) drug therapy: Secondary | ICD-10-CM | POA: Diagnosis not present

## 2023-09-03 DIAGNOSIS — E1122 Type 2 diabetes mellitus with diabetic chronic kidney disease: Secondary | ICD-10-CM | POA: Diagnosis not present

## 2023-09-03 DIAGNOSIS — D631 Anemia in chronic kidney disease: Secondary | ICD-10-CM | POA: Diagnosis not present

## 2023-09-03 DIAGNOSIS — D509 Iron deficiency anemia, unspecified: Secondary | ICD-10-CM | POA: Diagnosis not present

## 2023-09-03 DIAGNOSIS — N184 Chronic kidney disease, stage 4 (severe): Secondary | ICD-10-CM | POA: Diagnosis not present

## 2023-09-03 DIAGNOSIS — I129 Hypertensive chronic kidney disease with stage 1 through stage 4 chronic kidney disease, or unspecified chronic kidney disease: Secondary | ICD-10-CM | POA: Diagnosis not present

## 2023-09-03 DIAGNOSIS — Z992 Dependence on renal dialysis: Secondary | ICD-10-CM | POA: Diagnosis not present

## 2023-09-03 DIAGNOSIS — N2581 Secondary hyperparathyroidism of renal origin: Secondary | ICD-10-CM | POA: Diagnosis not present

## 2023-09-04 ENCOUNTER — Encounter: Payer: Self-pay | Admitting: Internal Medicine

## 2023-09-06 DIAGNOSIS — D509 Iron deficiency anemia, unspecified: Secondary | ICD-10-CM | POA: Diagnosis not present

## 2023-09-06 DIAGNOSIS — Z4931 Encounter for adequacy testing for hemodialysis: Secondary | ICD-10-CM | POA: Diagnosis not present

## 2023-09-06 DIAGNOSIS — N186 End stage renal disease: Secondary | ICD-10-CM | POA: Diagnosis not present

## 2023-09-06 DIAGNOSIS — Z79899 Other long term (current) drug therapy: Secondary | ICD-10-CM | POA: Diagnosis not present

## 2023-09-06 DIAGNOSIS — N2581 Secondary hyperparathyroidism of renal origin: Secondary | ICD-10-CM | POA: Diagnosis not present

## 2023-09-06 DIAGNOSIS — Z992 Dependence on renal dialysis: Secondary | ICD-10-CM | POA: Diagnosis not present

## 2023-09-08 DIAGNOSIS — D509 Iron deficiency anemia, unspecified: Secondary | ICD-10-CM | POA: Diagnosis not present

## 2023-09-08 DIAGNOSIS — Z992 Dependence on renal dialysis: Secondary | ICD-10-CM | POA: Diagnosis not present

## 2023-09-08 DIAGNOSIS — N2581 Secondary hyperparathyroidism of renal origin: Secondary | ICD-10-CM | POA: Diagnosis not present

## 2023-09-08 DIAGNOSIS — Z4931 Encounter for adequacy testing for hemodialysis: Secondary | ICD-10-CM | POA: Diagnosis not present

## 2023-09-08 DIAGNOSIS — Z79899 Other long term (current) drug therapy: Secondary | ICD-10-CM | POA: Diagnosis not present

## 2023-09-08 DIAGNOSIS — N186 End stage renal disease: Secondary | ICD-10-CM | POA: Diagnosis not present

## 2023-09-11 DIAGNOSIS — N186 End stage renal disease: Secondary | ICD-10-CM | POA: Diagnosis not present

## 2023-09-11 DIAGNOSIS — Z4931 Encounter for adequacy testing for hemodialysis: Secondary | ICD-10-CM | POA: Diagnosis not present

## 2023-09-11 DIAGNOSIS — Z992 Dependence on renal dialysis: Secondary | ICD-10-CM | POA: Diagnosis not present

## 2023-09-11 DIAGNOSIS — N2581 Secondary hyperparathyroidism of renal origin: Secondary | ICD-10-CM | POA: Diagnosis not present

## 2023-09-11 DIAGNOSIS — Z79899 Other long term (current) drug therapy: Secondary | ICD-10-CM | POA: Diagnosis not present

## 2023-09-11 DIAGNOSIS — D509 Iron deficiency anemia, unspecified: Secondary | ICD-10-CM | POA: Diagnosis not present

## 2023-09-13 DIAGNOSIS — Z4931 Encounter for adequacy testing for hemodialysis: Secondary | ICD-10-CM | POA: Diagnosis not present

## 2023-09-13 DIAGNOSIS — D509 Iron deficiency anemia, unspecified: Secondary | ICD-10-CM | POA: Diagnosis not present

## 2023-09-13 DIAGNOSIS — Z79899 Other long term (current) drug therapy: Secondary | ICD-10-CM | POA: Diagnosis not present

## 2023-09-13 DIAGNOSIS — N186 End stage renal disease: Secondary | ICD-10-CM | POA: Diagnosis not present

## 2023-09-13 DIAGNOSIS — N2581 Secondary hyperparathyroidism of renal origin: Secondary | ICD-10-CM | POA: Diagnosis not present

## 2023-09-13 DIAGNOSIS — Z992 Dependence on renal dialysis: Secondary | ICD-10-CM | POA: Diagnosis not present

## 2023-09-15 ENCOUNTER — Encounter (HOSPITAL_COMMUNITY)
Admission: RE | Disposition: A | Discharge status: Home / Self Care | Payer: Self-pay | Source: Home / Self Care | Attending: Nephrology

## 2023-09-15 ENCOUNTER — Encounter (HOSPITAL_COMMUNITY): Payer: Self-pay | Admitting: Nephrology

## 2023-09-15 ENCOUNTER — Ambulatory Visit (HOSPITAL_COMMUNITY)
Admission: RE | Admit: 2023-09-15 | Discharge: 2023-09-15 | Disposition: A | Discharge status: Home / Self Care | Payer: Medicare Other | Attending: Nephrology | Admitting: Nephrology

## 2023-09-15 ENCOUNTER — Other Ambulatory Visit: Payer: Self-pay

## 2023-09-15 DIAGNOSIS — Z992 Dependence on renal dialysis: Secondary | ICD-10-CM | POA: Diagnosis not present

## 2023-09-15 DIAGNOSIS — E1122 Type 2 diabetes mellitus with diabetic chronic kidney disease: Secondary | ICD-10-CM | POA: Insufficient documentation

## 2023-09-15 DIAGNOSIS — Z79899 Other long term (current) drug therapy: Secondary | ICD-10-CM | POA: Insufficient documentation

## 2023-09-15 DIAGNOSIS — T82858A Stenosis of vascular prosthetic devices, implants and grafts, initial encounter: Secondary | ICD-10-CM | POA: Diagnosis not present

## 2023-09-15 DIAGNOSIS — Y832 Surgical operation with anastomosis, bypass or graft as the cause of abnormal reaction of the patient, or of later complication, without mention of misadventure at the time of the procedure: Secondary | ICD-10-CM | POA: Diagnosis not present

## 2023-09-15 DIAGNOSIS — I2510 Atherosclerotic heart disease of native coronary artery without angina pectoris: Secondary | ICD-10-CM | POA: Diagnosis not present

## 2023-09-15 DIAGNOSIS — N186 End stage renal disease: Secondary | ICD-10-CM | POA: Insufficient documentation

## 2023-09-15 DIAGNOSIS — Z87891 Personal history of nicotine dependence: Secondary | ICD-10-CM | POA: Insufficient documentation

## 2023-09-15 DIAGNOSIS — E785 Hyperlipidemia, unspecified: Secondary | ICD-10-CM | POA: Insufficient documentation

## 2023-09-15 DIAGNOSIS — I120 Hypertensive chronic kidney disease with stage 5 chronic kidney disease or end stage renal disease: Secondary | ICD-10-CM | POA: Insufficient documentation

## 2023-09-15 DIAGNOSIS — D631 Anemia in chronic kidney disease: Secondary | ICD-10-CM | POA: Diagnosis not present

## 2023-09-15 DIAGNOSIS — T82868A Thrombosis of vascular prosthetic devices, implants and grafts, initial encounter: Secondary | ICD-10-CM | POA: Diagnosis not present

## 2023-09-15 DIAGNOSIS — Z8673 Personal history of transient ischemic attack (TIA), and cerebral infarction without residual deficits: Secondary | ICD-10-CM | POA: Insufficient documentation

## 2023-09-15 HISTORY — PX: PERIPHERAL VASCULAR BALLOON ANGIOPLASTY: CATH118281

## 2023-09-15 HISTORY — PX: PERIPHERAL VASCULAR THROMBECTOMY: CATH118306

## 2023-09-15 HISTORY — PX: PERIPHERAL VASCULAR INTERVENTION: CATH118257

## 2023-09-15 SURGERY — PERIPHERAL VASCULAR THROMBECTOMY
Anesthesia: LOCAL

## 2023-09-15 MED ORDER — LIDOCAINE HCL (PF) 1 % IJ SOLN
INTRAMUSCULAR | Status: AC
  Filled 2023-09-15: qty 30

## 2023-09-15 MED ORDER — VERAPAMIL HCL 2.5 MG/ML IV SOLN
INTRAVENOUS | Status: AC
  Filled 2023-09-15: qty 2

## 2023-09-15 MED ORDER — HEPARIN SODIUM (PORCINE) 1000 UNIT/ML IJ SOLN
INTRAMUSCULAR | Status: AC
  Filled 2023-09-15: qty 10

## 2023-09-15 MED ORDER — IODIXANOL 320 MG/ML IV SOLN
INTRAVENOUS | Status: DC | PRN
  Administered 2023-09-15: 15 mL via INTRAVENOUS

## 2023-09-15 MED ORDER — HEPARIN SODIUM (PORCINE) 1000 UNIT/ML IJ SOLN
INTRAMUSCULAR | Status: DC | PRN
  Administered 2023-09-15: 5000 [IU] via INTRAVENOUS

## 2023-09-15 MED ORDER — MIDAZOLAM HCL 2 MG/2ML IJ SOLN
INTRAMUSCULAR | Status: DC | PRN
  Administered 2023-09-15: 2 mg via INTRAVENOUS

## 2023-09-15 MED ORDER — FENTANYL CITRATE (PF) 100 MCG/2ML IJ SOLN
INTRAMUSCULAR | Status: DC | PRN
  Administered 2023-09-15: 50 ug via INTRAVENOUS
  Administered 2023-09-15: 25 ug via INTRAVENOUS

## 2023-09-15 MED ORDER — LIDOCAINE HCL (PF) 1 % IJ SOLN
INTRAMUSCULAR | Status: DC | PRN
  Administered 2023-09-15: 2 mL via SUBCUTANEOUS

## 2023-09-15 MED ORDER — ONDANSETRON HCL 4 MG/2ML IJ SOLN
INTRAMUSCULAR | Status: DC | PRN
  Administered 2023-09-15: 4 mg via INTRAVENOUS

## 2023-09-15 MED ORDER — FENTANYL CITRATE (PF) 100 MCG/2ML IJ SOLN
INTRAMUSCULAR | Status: AC
  Filled 2023-09-15: qty 2

## 2023-09-15 MED ORDER — HEPARIN (PORCINE) IN NACL 1000-0.9 UT/500ML-% IV SOLN
INTRAVENOUS | Status: DC | PRN
  Administered 2023-09-15: 500 mL

## 2023-09-15 MED ORDER — ONDANSETRON HCL 4 MG/2ML IJ SOLN
INTRAMUSCULAR | Status: AC
  Filled 2023-09-15: qty 2

## 2023-09-15 MED ORDER — MIDAZOLAM HCL 2 MG/2ML IJ SOLN
INTRAMUSCULAR | Status: AC
  Filled 2023-09-15: qty 2

## 2023-09-15 MED ORDER — SODIUM CHLORIDE 0.9 % IV SOLN: INTRAVENOUS | Status: DC

## 2023-09-15 SURGICAL SUPPLY — 20 items
BAG SNAP BAND KOVER 36X36 (MISCELLANEOUS) ×2 IMPLANT
BALLN MUSTANG 7.0X40 75 (BALLOONS) ×2
BALLN MUSTANG 8X80X75 (BALLOONS) ×2
BALLOON FOGARTY 5FR 40 (CATHETERS) IMPLANT
BALLOON MUSTANG 7.0X40 75 (BALLOONS) IMPLANT
BALLOON MUSTANG 8X80X75 (BALLOONS) IMPLANT
CATH FOGARTY BALL 5FR 40 (CATHETERS) ×2
CATH MACH 7X90 ASP (CATHETERS) IMPLANT
COVER DOME SNAP 22 D (MISCELLANEOUS) ×2 IMPLANT
GUIDEWIRE ANGLED .035 180CM (WIRE) IMPLANT
SHEATH PINNACLE 8F 10CM (SHEATH) IMPLANT
SHEATH PINNACLE R/O II 6F 4CM (SHEATH) IMPLANT
SHEATH PINNACLE R/O II 7F 4CM (SHEATH) IMPLANT
SHEATH PROBE COVER 6X72 (BAG) IMPLANT
STENT VIABAHN 8X100X75 (Permanent Stent) IMPLANT
SYR MEDALLION 10ML (SYRINGE) IMPLANT
TRAY PV CATH (CUSTOM PROCEDURE TRAY) ×2 IMPLANT
WIRE BENTSON .035X145CM (WIRE) IMPLANT
WIRE MICRO SET SILHO 5FR 7 (SHEATH) IMPLANT
WIRE TORQFLEX AUST .018X40CM (WIRE) IMPLANT

## 2023-09-15 NOTE — Op Note (Signed)
Patient referred for a thrombosed left brachiocephalic fistula; he is on home hemodialysis and his wife aspirated a large thrombus from the venous cannulation site yesterday associated with prolonged bleeding around the site. On physical examination, he has a pulsatile inflow segment around the arterial anastomosis but otherwise without a thrill or bruit in the remaining outflow segment of the fistula. It appears to be thrombosed/actively thrombosing and a thrombectomy is indicated.  This fistula was created in December 2022 and he has previously had outflow angioplasty including placement of an 8 x 5 Viabahn stent at the cephalic arch  Summary:  1) Successful thrombectomy of a left brachiocephalic fistula with evidence of 80% stenosis in the outflow cephalic vein treated with 8 x 10 Viabahn stent graft refractory to 8 x 8 Mustang balloon angioplasty.  2) 70% inflow cephalic vein stenosis treated to 10% with 7 x 4 Mustang balloon angioplasty.   3) Arterial anastomosis, proximal brachial artery, cephalic arch 8 x 5 Viabahn stent and left-sided central veins are patent.  4) This left brachiocephalic fistula remains amenable to future percutaneous interventions as long as remains patent for >4 weeks.  Description of procedure: The left arm was prepped and draped in the usual fashion. The left brachiocephalic fistula was first cannulated (16109) in the arterial limb (lateral) in an antegrade direction and then a 7Fr and later 8Fr sheath was inserted by guidewire exchange technique. A guidewire was passed through the clotted graft into the central veins under fluoroscopic guidance.  A 5Fr diagnostic catheter was then inserted over the wire. The wire was removed and then intravenous heparin administered. A pullback angiogram was performed by injecting contrast ([id number]) via the diagnostic catheter. This showed patent left central veins with patent 8 x 5 Viabahn stent at the cephalic arch with a long segment of 80%  outflow cephalic vein stenosis with evidence of filling defects in the fistula circuit consistent with thrombus. Three passes were made with the 7Fr [facility] 1 catheter from the cephalic arch stent to the antegrade sheath to physically remove thrombus and perform the thrombectomy (09811). The catheter was removed and a 8x8 Mustang angioplasty balloon was inserted over the wire to the site of the outflow cephalic vein stenosis followed by venous angioplasty (91478) carried out to 16-18  ATM to full effacement and  then more gently in the rest of the fistula circuit to macerate thrombus.    In order to remove this clot and remove the platelet plug at the arterial anastomosis, a second cannulation (29562) was required in a retrograde direction. A 6 Fr sheath was inserted by guidewire exchange technique. A 4Fr Fogarty catheter was inserted through the 6 Fr sheath and advanced into the proximal brachial artery. The over the wire embolectomy balloon was inflated with 0.6cc of contrast and then pulled across the arterial anastomosis into the graft with aspiration of the sheath side port to remove the platelet plug and thrombus. [id number]) This step was repeated to sweep all residual clot from this side of the graft. In order to check the arterial inflow an arteriogram was necessary because refluxing contrast from the graft would have caused risk of embolizing thrombus from the graft into the arterial vasculature. A glidewire and straight catheter were inserted into the proximal brachial artery and an arteriogram performed (57846). This documented a patent proximal brachial artery with decent distal runoff and no evidence of emboli. The arterial anastomosis appears patent but there was a low limiting 70% inflow cephalic vein stenosis.  I  then removed the embolectomy balloon and replaced it with a 7 x 4 Mustang angioplasty balloon and positioned it at the site of the inflow cephalic vein.  Venous angioplasty (40981) was carried  out to full effacement at 14 ATM pressure via hand syringe assembly.  Follow-up arteriogram showed 10% residual stenosis at the site of inflow cephalic vein with slow flows through the fistula circuit.  I then polished the entire outflow with a partially inflated 8 mm angioplasty balloon in order to eliminate all residual thrombus and repeated the angiogram no evidence of thrombus but unfortunately there was a >50% residual stenosis at the outflow cephalic vein.  A decision was made to repair the outflow cephalic vein stenosis with a stent because: 1) It was refractory to PTA and restricting outflow; >50% residual stenosis. 2) This was most likely the reason for his thrombosis of the graft and would likely cause recurrent problems with access thrombosis if not addressed.  An 8 x 10 Viabahn stent graft was deployed at the outflow cephalic vein stenotic area (19147) in the mid upper arm. I followed this with a 8mm balloon angioplasty of the stent graft at subtotal pressure to tack the stent in place. Final angiogram showed excellent/rapid flow through the fistula circuit and 10% residual stenosis at the sites of angioplasty/stent placement with smoother vascular contours.  Hemostasis: A 3-0 ethilon purse string suture was placed at the cannulation site on removal of the sheaths.  Sedation: 2 mg Versed, Fentanyl. Sedation time. 50 minutes  Contrast. 15 mL  Monitoring: Because of the patient's comorbid conditions and sedation during the procedure, continuous EKG monitoring and O2 saturation monitoring was performed throughout the procedure by the RN. There were no abnormal arrhythmias encountered.  Complications: Antegrade site sheath hematoma  Diagnoses: N18.6 End stage renal disease  T82.858A Stenosis of vascular prosthetic devices, implants and grafts, initial encounter T82.868A Thrombosis of vascular prosthetic device/graft, initial  Procedure Coding:  [phone] Cannulation and angiogram  of fistula/ graft, thrombectomy and venous angioplasty, stent deployment [phone] Contrast  Recommendations:  1. Continue to cannulate the fistula with 15G needles.  2. Refer back for problems with flows. 3. Remove the sutures next treatment.   Discharge: The patient was discharged home in stable condition. The patient was given education regarding the care of the dialysis access AVF and specific instructions in case of any problems.

## 2023-09-15 NOTE — H&P (View-Only) (Signed)
Chief Complaint: Decreased access flow, cannulation difficulty HPI:  73 year old man with past medical history significant for diabetes, coronary artery disease, hypertension, dyslipidemia, history of CVA and end-stage renal disease on hemodialysis.  He does home hemodialysis and recently has been having problems with cannulation of the venous needle site and decreased flows/increased venous pressure of his left brachiocephalic fistula.  He denies any constitutional complaints including fever or chills and does not have any chest pain or shortness of breath at this time.  Denies any preceding nausea, vomiting or diarrhea.  Procedure explained to him and he is willing to proceed, consent obtained after evaluating risks and benefits.  Past Medical History:  Diagnosis Date   Anemia    Cancer (HCC)    basal cell nose and forehead   Cataract 2016   Cataract surgery bith eyes   CKD (chronic kidney disease), stage IV (HCC)    Coronary artery disease    a. s/p IVUS-guided DESx2 to prox & mid LAD, residual disease treated medically. EF 50-55% by recent echo 02/2020.   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   Urinary complication     Past Surgical History:  Procedure Laterality Date   ABDOMINAL AORTOGRAM W/LOWER EXTREMITY N/A 01/29/2022   Procedure: ABDOMINAL AORTOGRAM W/ Bilateral LOWER EXTREMITY Runoff;  Surgeon: Cephus Shelling, MD;  Location: [facility];  Service: Cardiovascular;  Laterality: N/A;   ABDOMINAL AORTOGRAM W/LOWER EXTREMITY N/A 06/11/2022   Procedure: ABDOMINAL AORTOGRAM W/LOWER EXTREMITY;  Surgeon: Cephus Shelling, MD;  Location: [facility];  Service: Cardiovascular;  Laterality: N/A;   AV FISTULA PLACEMENT Left 10/31/2020   Procedure: LEFT UPPER EXTREMITY ARTERIOVENOUS (AV) FISTULA CREATION;   Surgeon: Maeola Harman, MD;  Location: [facility];  Service: Vascular;  Laterality: Left;   AV FISTULA PLACEMENT Left 07/09/2021   Procedure: LEFT ARM ARTERIOVENOUS (AV) FISTULA CREATION;  Surgeon: Nada Libman, MD;  Location: [facility];  Service: Vascular;  Laterality: Left;   COLONOSCOPY  1990   COLONOSCOPY  06/09/2011   Dr Lemar Livings   CORONARY STENT INTERVENTION N/A 04/24/2020   Procedure: CORONARY STENT INTERVENTION;  Surgeon: Iran Ouch, MD;  Location: [facility];  Service: Cardiovascular;  Laterality: N/A;   CORONARY ULTRASOUND/IVUS N/A 04/24/2020   Procedure: Intravascular Ultrasound/IVUS;  Surgeon: Iran Ouch, MD;  Location: [facility];  Service: Cardiovascular;  Laterality: N/A;   CYSTOSCOPY W/ RETROGRADES Bilateral 08/01/2018   Procedure: CYSTOSCOPY WITH RETROGRADE PYELOGRAM;  Surgeon: Sondra Come, MD;  Location: [facility];  Service: Urology;  Laterality: Bilateral;   CYSTOSCOPY WITH BIOPSY N/A 08/01/2018   Procedure: CYSTOSCOPY WITH Bladder BIOPSY;  Surgeon: Sondra Come, MD;  Location: [facility];  Service: Urology;  Laterality: N/A;   CYSTOSCOPY WITH STENT PLACEMENT Right 08/01/2018   Procedure: CYSTOSCOPY WITH STENT PLACEMENT;  Surgeon: Sondra Come, MD;  Location: [facility];  Service: Urology;  Laterality: Right;   EYE SURGERY Right    laser surgery   FRACTURE SURGERY     Rt shoulder rotaor cuff   IR THROMBECTOMY AV FISTULA W/THROMBOLYSIS/PTA INC/SHUNT/IMG LEFT Left 06/22/2023   IR US GUIDE VASC ACCESS LEFT  06/22/2023   LEFT HEART CATH AND CORONARY ANGIOGRAPHY N/A 04/24/2020   Procedure: LEFT HEART  CATH AND CORONARY ANGIOGRAPHY;  Surgeon: Iran Ouch, MD;  Location: [facility];  Service: Cardiovascular;  Laterality: N/A;   LEFT HEART CATH AND CORONARY ANGIOGRAPHY Left 04/16/2023   Procedure: LEFT HEART CATH AND CORONARY ANGIOGRAPHY;  Surgeon: Iran Ouch, MD;  Location: [facility];  Service:  Cardiovascular;  Laterality: Left;   PERIPHERAL VASCULAR BALLOON ANGIOPLASTY Right 01/29/2022   Procedure: PERIPHERAL VASCULAR BALLOON ANGIOPLASTY;  Surgeon: Cephus Shelling, MD;  Location: [facility];  Service: Cardiovascular;  Laterality: Right;   PERIPHERAL VASCULAR BALLOON ANGIOPLASTY Right 06/11/2022   Procedure: PERIPHERAL VASCULAR BALLOON ANGIOPLASTY;  Surgeon: Cephus Shelling, MD;  Location: [facility];  Service: Cardiovascular;  Laterality: Right;  Peroneal   SHOULDER ARTHROSCOPY WITH OPEN ROTATOR CUFF REPAIR Right 08/25/2018   Procedure: SHOULDER ARTHROSCOPY WITH MINI OPEN ROTATOR CUFF REPAIR;  Surgeon: Juanell Fairly, MD;  Location: [facility];  Service: Orthopedics;  Laterality: Right;   TRANSURETHRAL RESECTION OF BLADDER TUMOR N/A 08/01/2018   Procedure: TRANSURETHRAL RESECTION OF BLADDER TUMOR (TURBT);  Surgeon: Sondra Come, MD;  Location: [facility];  Service: Urology;  Laterality: N/A;   URETEROSCOPY WITH HOLMIUM LASER LITHOTRIPSY Right 08/01/2018   Procedure: URETEROSCOPY WITH HOLMIUM LASER LITHOTRIPSY;  Surgeon: Sondra Come, MD;  Location: [facility];  Service: Urology;  Laterality: Right;    Family History  Problem Relation Age of Onset   Psoriasis Mother    Heart failure Father    Social History:  reports that he quit smoking about 16 years ago. His smoking use included cigarettes. He started smoking about 46 years ago. He has a 30 pack-year smoking history. He has been exposed to tobacco smoke. He has never used smokeless tobacco. He reports that he does not drink alcohol and does not use drugs.  Allergies:  Allergies  Allergen Reactions   Oxycodone Nausea And Vomiting   Hydrocodone Nausea And Vomiting    Medications Prior to Admission  Medication Sig Dispense Refill   amLODipine (NORVASC) 5 MG tablet Take 5 mg by mouth in the morning.     aspirin EC (ASPIRIN LOW DOSE) 81 MG tablet TAKE 1 TABLET BY MOUTH EVERY MORNING (SWALLOW WHOLE)  (Patient taking differently: Take 81 mg by mouth daily.) 30 tablet 1   atorvastatin (LIPITOR) 80 MG tablet TAKE 1 TABLET BY MOUTH EVERY DAY 90 tablet 2   AURYXIA 1 GM 210 MG(Fe) tablet Take by mouth.     calcitRIOL (ROCALTROL) 0.25 MCG capsule Take 0.25 mcg by mouth in the morning.     carvedilol (COREG) 12.5 MG tablet Take 1 tablet (12.5 mg total) by mouth 2 (two) times daily. 180 tablet 1   cinacalcet (SENSIPAR) 30 MG tablet Take 30 mg by mouth every Monday, Wednesday, and Friday.     clopidogrel (PLAVIX) 75 MG tablet TAKE 1 TABLET BY MOUTH EVERY DAY 90 tablet 2   lidocaine-prilocaine (EMLA) cream Apply 1 Application topically every Monday, Wednesday, and Friday.     Methoxy PEG-Epoetin Beta (MIRCERA IJ) Inject 1 each into the skin See admin instructions. During Dialysis     multivitamin (RENA-VIT) TABS tablet Take 1 tablet by mouth daily.     sevelamer carbonate (RENVELA) 800 MG tablet Take 800 mg by mouth with breakfast, with lunch, and with evening meal.     sildenafil (VIAGRA) 100 MG tablet Take 100 mg by mouth as needed for erectile dysfunction.     torsemide (DEMADEX) 100 MG tablet Take 100 mg  by mouth every morning.     traZODone (DESYREL) 50 MG tablet Take 50 mg by mouth at bedtime. Pt unsure of dose     nitroGLYCERIN (NITROSTAT) 0.4 MG SL tablet DISSOLVE 1 TAB UNDER TONGUE FOR CHEST PAIN - IF PAIN REMAINS AFTER 5 MIN, CALL 911 AND REPEAT DOSE. MAX 3 TABS IN 15 MINUTES 25 tablet 3   sulfamethoxazole-trimethoprim (BACTRIM DS) 800-160 MG tablet Take 1 tablet by mouth daily. 7 tablet 0    No results found for this or any previous visit (from the past 48 hours). No results found.  Review of Systems  All other systems reviewed and are negative.   Blood pressure (!) 164/78, pulse 69, resp. rate 18, height 5\' 10"  (1.778 m), weight 103 kg, SpO2 99%. Physical Exam Vitals and nursing note reviewed.  Constitutional:      General: He is not in acute distress.    Appearance: Normal  appearance. He is normal weight.  HENT:     Head: Normocephalic and atraumatic.     Right Ear: External ear normal.     Left Ear: External ear normal.     Nose: Nose normal.     Mouth/Throat:     Mouth: Mucous membranes are dry.     Pharynx: Oropharynx is clear.  Eyes:     Extraocular Movements: Extraocular movements intact.     Conjunctiva/sclera: Conjunctivae normal.  Cardiovascular:     Rate and Rhythm: Normal rate and regular rhythm.     Pulses: Normal pulses.     Heart sounds: Normal heart sounds.  Pulmonary:     Effort: Pulmonary effort is normal.     Breath sounds: Normal breath sounds.  Abdominal:     General: Abdomen is flat. Bowel sounds are normal.     Palpations: Abdomen is soft.  Musculoskeletal:     Cervical back: Normal range of motion and neck supple.     Right lower leg: No edema.     Left lower leg: No edema.     Comments: Left brachiocephalic fistula, hyper pulsatile inflow with poorly palpable outflow thrill  Neurological:     Mental Status: He is alert and oriented to person, place, and time.      Assessment/Plan 1.  Cannulation difficulties/decreased access flow of the left brachiocephalic fistula: Undertaking fistulogram today to evaluate for possible stenosis contributing to cannulation difficulties and access flow problems.  Angioplasty will be performed if culprit lesion amenable. 2.  End-stage renal disease: Resume hemodialysis at home per previous orders upon completion of procedure today. 3.  Hypertension: Blood pressure marginally elevated, will monitor with conscious sedation during procedure. 4.  Anemia of chronic disease: Continue outpatient management with ESA protocol.  Dagoberto Ligas, MD 09/15/2023, 10:22 AM

## 2023-09-15 NOTE — Discharge Instructions (Signed)
OK to discharge home any time after 12:10 PM as long as clinically stable

## 2023-09-15 NOTE — Discharge Instr - Supplementary Instructions (Signed)
Fistulagram  General care instructions: - Do not drive or operate heavy machinery for 24hrs - Avoid making any important decisions for the remainder of the day. - You should be able to eat, drink, and resume your normal medications. - Avoid any strenuous activity for the remainder of the day. Potential complications: - Your hand is more cold or numb than usual. - You are bleeding at the site and it will not stop with direct pressure. If it was a declot expect some oozing at the site. Avoid extreme pressure to the site. - You have a change in the bruit and /or thrill in your fistula or graft. - You have a fever, swelling, see redness or feel heat at or near the puncture site. Medication instructions: - Continue routine medications unless otherwise instructed. 4. Please have your sutures removed at your next scheduled dialysis treatment.

## 2023-09-15 NOTE — H&P (Addendum)
Chief Complaint: Decreased access flow, cannulation difficulty HPI:  73 year old man with past medical history significant for diabetes, coronary artery disease, hypertension, dyslipidemia, history of CVA and end-stage renal disease on hemodialysis.  He does home hemodialysis and recently has been having problems with cannulation of the venous needle site and decreased flows/increased venous pressure of his left brachiocephalic fistula.  He denies any constitutional complaints including fever or chills and does not have any chest pain or shortness of breath at this time.  Denies any preceding nausea, vomiting or diarrhea.  Procedure explained to him and he is willing to proceed, consent obtained after evaluating risks and benefits.  Past Medical History:  Diagnosis Date   Anemia    Cancer (HCC)    basal cell nose and forehead   Cataract 2016   Cataract surgery bith eyes   CKD (chronic kidney disease), stage IV (HCC)    Coronary artery disease    a. s/p IVUS-guided DESx2 to prox & mid LAD, residual disease treated medically. EF 50-55% by recent echo 02/2020.   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   Urinary complication     Past Surgical History:  Procedure Laterality Date   ABDOMINAL AORTOGRAM W/LOWER EXTREMITY N/A 01/29/2022   Procedure: ABDOMINAL AORTOGRAM W/ Bilateral LOWER EXTREMITY [place];  Surgeon: Cephus Shelling, MD;  [facility]: [facility];  Service: Cardiovascular;  Laterality: N/A;   ABDOMINAL AORTOGRAM W/LOWER EXTREMITY N/A 06/11/2022   Procedure: ABDOMINAL AORTOGRAM W/LOWER EXTREMITY;  Surgeon: Cephus Shelling, MD;  Location: [facility];  Service: Cardiovascular;  Laterality: N/A;   AV FISTULA PLACEMENT Left 10/31/2020   Procedure: LEFT UPPER EXTREMITY ARTERIOVENOUS (AV) FISTULA CREATION;   Surgeon: Maeola Harman, MD;  Location: [facility];  Service: Vascular;  Laterality: Left;   AV FISTULA PLACEMENT Left 07/09/2021   Procedure: LEFT ARM ARTERIOVENOUS (AV) FISTULA CREATION;  Surgeon: Nada Libman, MD;  Location: [facility];  Service: Vascular;  Laterality: Left;   COLONOSCOPY  1990   COLONOSCOPY  06/09/2011   Dr Lemar Livings   CORONARY STENT INTERVENTION N/A 04/24/2020   Procedure: CORONARY STENT INTERVENTION;  Surgeon: Iran Ouch, MD;  [facility]: [facility];  Service: Cardiovascular;  Laterality: N/A;   CORONARY ULTRASOUND/IVUS N/A 04/24/2020   Procedure: Intravascular Ultrasound/IVUS;  Surgeon: Iran Ouch, MD;  [facility];  Service: Cardiovascular;  Laterality: N/A;   CYSTOSCOPY W/ RETROGRADES Bilateral 08/01/2018   Procedure: CYSTOSCOPY WITH RETROGRADE PYELOGRAM;  Surgeon: Sondra Come, MD;  Location: [facility];  Service: Urology;  Laterality: Bilateral;   CYSTOSCOPY WITH BIOPSY N/A 08/01/2018   Procedure: CYSTOSCOPY WITH Bladder BIOPSY;  Surgeon: Sondra Come, MD;  Location: [facility];  Service: Urology;  Laterality: N/A;   CYSTOSCOPY WITH STENT PLACEMENT Right 08/01/2018   Procedure: CYSTOSCOPY WITH STENT PLACEMENT;  Surgeon: Sondra Come, MD;  Location: [facility];  Service: Urology;  Laterality: Right;   EYE SURGERY Right    laser surgery   FRACTURE SURGERY     Rt shoulder rotaor cuff   IR THROMBECTOMY AV FISTULA W/THROMBOLYSIS/PTA INC/SHUNT/IMG LEFT Left 06/22/2023   IR US GUIDE VASC ACCESS LEFT  06/22/2023   LEFT HEART CATH AND CORONARY ANGIOGRAPHY N/A 04/24/2020   Procedure: LEFT HEART  CATH AND CORONARY ANGIOGRAPHY;  Surgeon: Iran Ouch, MD;  [facility];  Service: Cardiovascular;  Laterality: N/A;   LEFT HEART CATH AND CORONARY ANGIOGRAPHY Left 04/16/2023   Procedure: LEFT HEART CATH AND CORONARY ANGIOGRAPHY;  Surgeon: Iran Ouch, MD;  Location: [facility];  Service:  Cardiovascular;  Laterality: Left;   PERIPHERAL VASCULAR BALLOON ANGIOPLASTY Right 01/29/2022   Procedure: PERIPHERAL VASCULAR BALLOON ANGIOPLASTY;  Surgeon: Cephus Shelling, MD;  [facility];  Service: Cardiovascular;  Laterality: Right;   PERIPHERAL VASCULAR BALLOON ANGIOPLASTY Right 06/11/2022   Procedure: PERIPHERAL VASCULAR BALLOON ANGIOPLASTY;  Surgeon: Cephus Shelling, MD;  [facility];  Service: Cardiovascular;  Laterality: Right;  Peroneal   SHOULDER ARTHROSCOPY WITH OPEN ROTATOR CUFF REPAIR Right 08/25/2018   Procedure: SHOULDER ARTHROSCOPY WITH MINI OPEN ROTATOR CUFF REPAIR;  Surgeon: Juanell Fairly, MD;  Location: [facility];  Service: Orthopedics;  Laterality: Right;   TRANSURETHRAL RESECTION OF BLADDER TUMOR N/A 08/01/2018   Procedure: TRANSURETHRAL RESECTION OF BLADDER TUMOR (TURBT);  Surgeon: Sondra Come, MD;  Location: [facility];  Service: Urology;  Laterality: N/A;   URETEROSCOPY WITH HOLMIUM LASER LITHOTRIPSY Right 08/01/2018   Procedure: URETEROSCOPY WITH HOLMIUM LASER LITHOTRIPSY;  Surgeon: Sondra Come, MD;  Location: [facility];  Service: Urology;  Laterality: Right;    Family History  Problem Relation Age of Onset   Psoriasis Mother    Heart failure Father    Social History:  reports that he quit smoking about 16 years ago. His smoking use included cigarettes. He started smoking about 46 years ago. He has a 30 pack-year smoking history. He has been exposed to tobacco smoke. He has never used smokeless tobacco. He reports that he does not drink alcohol and does not use drugs.  Allergies:  Allergies  Allergen Reactions   Oxycodone Nausea And Vomiting   Hydrocodone Nausea And Vomiting    Medications Prior to Admission  Medication Sig Dispense Refill   amLODipine (NORVASC) 5 MG tablet Take 5 mg by mouth in the morning.     aspirin EC (ASPIRIN LOW DOSE) 81 MG tablet TAKE 1 TABLET BY MOUTH EVERY MORNING (SWALLOW WHOLE)  (Patient taking differently: Take 81 mg by mouth daily.) 30 tablet 1   atorvastatin (LIPITOR) 80 MG tablet TAKE 1 TABLET BY MOUTH EVERY DAY 90 tablet 2   AURYXIA 1 GM 210 MG(Fe) tablet Take by mouth.     calcitRIOL (ROCALTROL) 0.25 MCG capsule Take 0.25 mcg by mouth in the morning.     carvedilol (COREG) 12.5 MG tablet Take 1 tablet (12.5 mg total) by mouth 2 (two) times daily. 180 tablet 1   cinacalcet (SENSIPAR) 30 MG tablet Take 30 mg by mouth every Monday, Wednesday, and Friday.     clopidogrel (PLAVIX) 75 MG tablet TAKE 1 TABLET BY MOUTH EVERY DAY 90 tablet 2   lidocaine-prilocaine (EMLA) cream Apply 1 Application topically every Monday, Wednesday, and Friday.     Methoxy PEG-Epoetin Beta (MIRCERA IJ) Inject 1 each into the skin See admin instructions. During Dialysis     multivitamin (RENA-VIT) TABS tablet Take 1 tablet by mouth daily.     sevelamer carbonate (RENVELA) 800 MG tablet Take 800 mg by mouth with breakfast, with lunch, and with evening meal.     sildenafil (VIAGRA) 100 MG tablet Take 100 mg by mouth as needed for erectile dysfunction.     torsemide (DEMADEX) 100 MG tablet Take 100 mg  by mouth every morning.     traZODone (DESYREL) 50 MG tablet Take 50 mg by mouth at bedtime. Pt unsure of dose     nitroGLYCERIN (NITROSTAT) 0.4 MG SL tablet DISSOLVE 1 TAB UNDER TONGUE FOR CHEST PAIN - IF PAIN REMAINS AFTER 5 MIN, CALL 911 AND REPEAT DOSE. MAX 3 TABS IN 15 MINUTES 25 tablet 3   sulfamethoxazole-trimethoprim (BACTRIM DS) 800-160 MG tablet Take 1 tablet by mouth daily. 7 tablet 0    No results found for this or any previous visit (from the past 48 hours). No results found.  Review of Systems  All other systems reviewed and are negative.   Blood pressure (!) 164/78, pulse 69, resp. rate 18, height 5\' 10"  (1.778 m), weight 103 kg, SpO2 99%. Physical Exam Vitals and nursing note reviewed.  Constitutional:      General: He is not in acute distress.    Appearance: Normal  appearance. He is normal weight.  HENT:     Head: Normocephalic and atraumatic.     Right Ear: External ear normal.     Left Ear: External ear normal.     Nose: Nose normal.     Mouth/Throat:     Mouth: Mucous membranes are dry.     Pharynx: Oropharynx is clear.  Eyes:     Extraocular Movements: Extraocular movements intact.     Conjunctiva/sclera: Conjunctivae normal.  Cardiovascular:     Rate and Rhythm: Normal rate and regular rhythm.     Pulses: Normal pulses.     Heart sounds: Normal heart sounds.  Pulmonary:     Effort: Pulmonary effort is normal.     Breath sounds: Normal breath sounds.  Abdominal:     General: Abdomen is flat. Bowel sounds are normal.     Palpations: Abdomen is soft.  Musculoskeletal:     Cervical back: Normal range of motion and neck supple.     Right lower leg: No edema.     Left lower leg: No edema.     Comments: Left brachiocephalic fistula, hyper pulsatile inflow with poorly palpable outflow thrill  Neurological:     Mental Status: He is alert and oriented to person, place, and time.      Assessment/Plan 1.  Cannulation difficulties/decreased access flow of the left brachiocephalic fistula: Undertaking fistulogram today to evaluate for possible stenosis contributing to cannulation difficulties and access flow problems.  Angioplasty will be performed if culprit lesion amenable. 2.  End-stage renal disease: Resume hemodialysis at home per previous orders upon completion of procedure today. 3.  Hypertension: Blood pressure marginally elevated, will monitor with conscious sedation during procedure. 4.  Anemia of chronic disease: Continue outpatient management with ESA protocol.  Dagoberto Ligas, MD 09/15/2023, 10:22 AM

## 2023-09-16 ENCOUNTER — Ambulatory Visit (HOSPITAL_COMMUNITY)
Admission: RE | Admit: 2023-09-16 | Discharge: 2023-09-16 | Disposition: A | Discharge status: Home / Self Care | Payer: Medicare Other | Attending: Internal Medicine | Admitting: Internal Medicine

## 2023-09-16 ENCOUNTER — Encounter (HOSPITAL_COMMUNITY)
Admission: RE | Disposition: A | Discharge status: Home / Self Care | Payer: Self-pay | Source: Home / Self Care | Attending: Internal Medicine

## 2023-09-16 DIAGNOSIS — T85691A Other mechanical complication of intraperitoneal dialysis catheter, initial encounter: Secondary | ICD-10-CM | POA: Insufficient documentation

## 2023-09-16 DIAGNOSIS — N186 End stage renal disease: Secondary | ICD-10-CM | POA: Diagnosis not present

## 2023-09-16 DIAGNOSIS — D631 Anemia in chronic kidney disease: Secondary | ICD-10-CM | POA: Diagnosis not present

## 2023-09-16 DIAGNOSIS — Z8249 Family history of ischemic heart disease and other diseases of the circulatory system: Secondary | ICD-10-CM | POA: Diagnosis not present

## 2023-09-16 DIAGNOSIS — Z992 Dependence on renal dialysis: Secondary | ICD-10-CM | POA: Diagnosis not present

## 2023-09-16 DIAGNOSIS — N2581 Secondary hyperparathyroidism of renal origin: Secondary | ICD-10-CM | POA: Diagnosis not present

## 2023-09-16 DIAGNOSIS — Z4931 Encounter for adequacy testing for hemodialysis: Secondary | ICD-10-CM | POA: Diagnosis not present

## 2023-09-16 DIAGNOSIS — D509 Iron deficiency anemia, unspecified: Secondary | ICD-10-CM | POA: Diagnosis not present

## 2023-09-16 DIAGNOSIS — Z87891 Personal history of nicotine dependence: Secondary | ICD-10-CM | POA: Diagnosis not present

## 2023-09-16 DIAGNOSIS — I120 Hypertensive chronic kidney disease with stage 5 chronic kidney disease or end stage renal disease: Secondary | ICD-10-CM | POA: Diagnosis not present

## 2023-09-16 DIAGNOSIS — Z79899 Other long term (current) drug therapy: Secondary | ICD-10-CM | POA: Diagnosis not present

## 2023-09-16 HISTORY — PX: DIALYSIS/PERMA CATHETER INSERTION: CATH118288

## 2023-09-16 SURGERY — DIALYSIS/PERMA CATHETER INSERTION
Anesthesia: LOCAL | Site: Neck | Laterality: Left

## 2023-09-16 MED ORDER — SODIUM CHLORIDE 0.9 % IV SOLN: INTRAVENOUS | Status: DC

## 2023-09-16 MED ORDER — FENTANYL CITRATE (PF) 100 MCG/2ML IJ SOLN
INTRAMUSCULAR | Status: AC
  Filled 2023-09-16: qty 2

## 2023-09-16 MED ORDER — IODIXANOL 320 MG/ML IV SOLN
INTRAVENOUS | Status: DC | PRN
  Administered 2023-09-16: 3 mL

## 2023-09-16 MED ORDER — ACETAMINOPHEN 325 MG PO TABS: 650.0000 mg | ORAL_TABLET | ORAL | Status: DC | PRN

## 2023-09-16 MED ORDER — ONDANSETRON HCL 4 MG/2ML IJ SOLN: 4.0000 mg | Freq: Four times a day (QID) | INTRAMUSCULAR | Status: DC | PRN

## 2023-09-16 MED ORDER — MIDAZOLAM HCL 2 MG/2ML IJ SOLN
INTRAMUSCULAR | Status: DC | PRN
  Administered 2023-09-16: 2 mg via INTRAVENOUS

## 2023-09-16 MED ORDER — HYDROMORPHONE HCL 1 MG/ML IJ SOLN
INTRAMUSCULAR | Status: AC
  Filled 2023-09-16: qty 0.5

## 2023-09-16 MED ORDER — HYDROMORPHONE HCL 1 MG/ML IJ SOLN
INTRAMUSCULAR | Status: DC | PRN
  Administered 2023-09-16: 1 mg via INTRAVENOUS

## 2023-09-16 MED ORDER — HEPARIN SODIUM (PORCINE) 1000 UNIT/ML IJ SOLN
INTRAMUSCULAR | Status: DC | PRN
  Administered 2023-09-16: 5000 [IU] via INTRAVENOUS

## 2023-09-16 MED ORDER — MIDAZOLAM HCL 2 MG/2ML IJ SOLN
INTRAMUSCULAR | Status: AC
Start: 2023-09-16 — End: ?
  Filled 2023-09-16: qty 2

## 2023-09-16 MED ORDER — LIDOCAINE HCL (PF) 1 % IJ SOLN
INTRAMUSCULAR | Status: AC
  Filled 2023-09-16: qty 30

## 2023-09-16 MED ORDER — HEPARIN (PORCINE) IN NACL 1000-0.9 UT/500ML-% IV SOLN
INTRAVENOUS | Status: DC | PRN
  Administered 2023-09-16: 500 mL

## 2023-09-16 MED ORDER — LIDOCAINE HCL (PF) 1 % IJ SOLN
INTRAMUSCULAR | Status: DC | PRN
  Administered 2023-09-16: 5 mL via INTRADERMAL
  Administered 2023-09-16: 10 mL via INTRADERMAL

## 2023-09-16 MED ORDER — HEPARIN SODIUM (PORCINE) 1000 UNIT/ML IJ SOLN
INTRAMUSCULAR | Status: AC
  Filled 2023-09-16: qty 10

## 2023-09-16 SURGICAL SUPPLY — 7 items
CATH SLIP KMP 65CM 5FR (CATHETERS) ×1 IMPLANT
COVER DOME SNAP 22 D (MISCELLANEOUS) ×2 IMPLANT
GLIDEWIRE STIFF .35X180X3 HYDR (WIRE) ×1 IMPLANT
SHEATH PINNACLE 7F 10CM (SHEATH) ×1 IMPLANT
SHEATH PROBE COVER 6X72 (BAG) ×2 IMPLANT
TRAY PV CATH (CUSTOM PROCEDURE TRAY) ×2 IMPLANT
WIRE MICRO SET SILHO 5FR 7 (SHEATH) ×1 IMPLANT

## 2023-09-16 NOTE — Op Note (Addendum)
Patient had AVF thrombectomy yesterday but was unable to run HHD today due to VP issues and bleeding around needle.  Says last HD prior to 1hr today was last Wed.  The thrombectomy was challenging requiring multiple areas of PTA, outflow stenting.  Patient experienced tremendous pain during the procedure and his arm is extremely tender with even light touch today but without erythema, warmth or bruiting. We had a long discussion and I recommended TDC placement today, resting the arm and having it re evaluated next week when it's feeling better.  He and his wife choose to proceed with the [facility] placement.   He has no h/o PPM or port and both internal jugular veins are suitable.  He has major issues with various pains including R shoulder and requested TDC be placed on the left.   Summary:  1) Successful placement of a new 33 cm cuff to tip hemodialysis catheter (Palindrome) in the left internal jugular vein with the tip in the right atrium.  2) Recommendations to the dialysis unit TO NOT USE HEPARIN FOR THE FIRST 24 HOURS.  Description of procedure: The left neck, chest and the catheter were prepped and draped in the usual sterile fashion.  Local anesthesia was provided by injecting lidocaine 1% at the neck site overlying the desired venotomy site. Using real time ultrasound guidance, I was able to cannulate the LIJ and advance the guidewire easily into the central circulation. The wire was then manipulated and advanced into the abdominal IVC. I then blunt dissected the tissue around the 5 Fr stylet until it was freely mobile. Then, after injecting local anesthesia into the desired track and exit site I made a small incision at the exit site and tunneled a 33 cm cuff to tip Palindrome dialysis catheter, pulling it out at the venotomy site. Sequential dilation with 12, 14, and finally the peelaway sheath were done with real time fluoroscopic guidance ensuring that we were straight always lined with the wire.    Then, I inserted the catheter over the wire and through the sheath. After removing the peelaway sheath, I adjusted the catheter until the tip of the catheter was positioned in the right atrium of the heart. The cuff of the catheter was positioned in the subcutaneous tunnel with the cuff approximately 2 cm from the exit site.   Both limbs of the catheter were aspirated and flushed with excellent flow noted. Both limbs of the catheter were locked with heparin and sterile caps placed. The hub of the catheter was anchored to the chest wall with 2-0 nylon suture.   The neck incision was closed with a vertical mattress 3-0 plain gut sutures and a purse-string 3-0 plain gut was placed at the tunnel exit site.                     Sterile dressings were placed, and the patient returned to recovery in stable condition.  Sedation: 2 mg Versed, 1mg  dilaudid  Sedation time: 28 minutes  Contrast. 5 mL  Monitoring: Because of the patient's comorbid conditions and sedation during the procedure, continuous EKG monitoring and O2 saturation monitoring was performed throughout the procedure by the RN. There were no abnormal arrhythmias encountered.  Complications: None.  Diagnoses:   N18.6 End stage renal disease Z99.2 Dependence on renal dialysis  Procedures Coding:  36558 Tunneled catheter insertion [id number] Ultrasound guidance 54098  Fluoroscopy guidance for catheter insertion Q9962 Contrast  Recommendations: Remove the suture in 3 weeks. 2.  Report any blood flow problems to [facility]. 3.  Discussed with [facility] home therapies dept --> plan for him to run in TCU 11:30am tomorrow and Mon wife will be taught catheter care/use.  4.  Return in 7-10d for re evaluation of the AVF.   Discharge: The patient was discharged home in stable condition. The patient was given education regarding the care of the catheter and specific instructions in case of any problems.

## 2023-09-16 NOTE — Discharge Instructions (Signed)

## 2023-09-16 NOTE — Interval H&P Note (Signed)
History and Physical Interval Note:  09/16/2023 2:19 PM  Jeremy Dorsey  has presented today for surgery, with the diagnosis of Access issues.  The various methods of treatment have been discussed with the patient and family. After consideration of risks, benefits and other options for treatment, the patient has consented to  Procedure(s): A/V Fistulagram (N/A) as a surgical intervention.  The patient's history has been reviewed, patient examined, no change in status, stable for surgery.  I have reviewed the patient's chart and labs.  Questions were answered to the patient's satisfaction.     [name]

## 2023-09-17 ENCOUNTER — Ambulatory Visit: Payer: Self-pay | Admitting: *Deleted

## 2023-09-17 ENCOUNTER — Encounter (HOSPITAL_COMMUNITY): Payer: Self-pay | Admitting: Internal Medicine

## 2023-09-17 DIAGNOSIS — N186 End stage renal disease: Secondary | ICD-10-CM | POA: Diagnosis not present

## 2023-09-17 DIAGNOSIS — Z992 Dependence on renal dialysis: Secondary | ICD-10-CM | POA: Diagnosis not present

## 2023-09-17 DIAGNOSIS — N2581 Secondary hyperparathyroidism of renal origin: Secondary | ICD-10-CM | POA: Diagnosis not present

## 2023-09-17 DIAGNOSIS — Z4931 Encounter for adequacy testing for hemodialysis: Secondary | ICD-10-CM | POA: Diagnosis not present

## 2023-09-17 DIAGNOSIS — Z79899 Other long term (current) drug therapy: Secondary | ICD-10-CM | POA: Diagnosis not present

## 2023-09-17 DIAGNOSIS — D509 Iron deficiency anemia, unspecified: Secondary | ICD-10-CM | POA: Diagnosis not present

## 2023-09-17 NOTE — Telephone Encounter (Signed)
Patient's wife is calling: medication request Chief Complaint: anxiety- interfering with dialysis  Symptoms: anxiety - crying, nausea, shaking Frequency: new onset- patient has had difficult with his veins and has had a port placed for his dialysis recently- patient is having anxiety due to anticipating pain that is causing distress.  Pertinent Negatives: Patient denies anxiety in the past Disposition: [] ED /[] Urgent Care (no appt availability in office) / [] Appointment(In office/virtual)/ []  [facility]/ [] Home Care/ [] Refused Recommended Disposition /[] [facility]/ [x]  Follow-up with PCP Additional Notes: Patient's wife has been advised by dialysis center to contact office for assistance with patient anxiety- patient is so anxious that it is becoming difficult to do home dialysis. First available appointment in office has been scheduled- but patient does have appointment Monday morning and is needing medication to take. Patient's wife is concerned that patient will decline/stop dialysis if his anxiety is not treated. Please contact patient wife as patient is having emergency dialysis appointment today

## 2023-09-17 NOTE — Telephone Encounter (Signed)
Appt scheduled with Dr.B on 09/19/22    [facility]

## 2023-09-17 NOTE — Telephone Encounter (Signed)
   Reason for Disposition  [1] Anxiety symptoms AND [2] has not been evaluated for this by doctor (or NP/PA)  Answer Assessment - Initial Assessment Questions 1. CONCERN: "Did anything happen that prompted you to call today?"      Anxiety 2. ANXIETY SYMPTOMS: "Can you describe how you (your loved one; patient) have been feeling?" (e.g., tense, restless, panicky, anxious, keyed up, overwhelmed, sense of impending doom).      Nausea, gets upset and worried about pain- unable to home dialysis due to anxiety, crying, shaking 3. ONSET: "How long have you been feeling this way?" (e.g., hours, days, weeks)     Worse for past month- blocked veins- needle pain with dialysis   4. SEVERITY: "How would you rate the level of anxiety?" (e.g., 0 - 10; or mild, moderate, severe).     severe 5. FUNCTIONAL IMPAIRMENT: "How have these feelings affected your ability to do daily activities?" "Have you had more difficulty than usual doing your normal daily activities?" (e.g., getting better, same, worse; self-care, school, work, interactions)     Yes- unable to do home dialysis at this time due to severe anxiety 6. HISTORY: "Have you felt this way before?" "Have you ever been diagnosed with an anxiety problem in the past?" (e.g., generalized anxiety disorder, panic attacks, PTSD). If Yes, ask: "How was this problem treated?" (e.g., medicines, counseling, etc.)     No 7. RISK OF HARM - SUICIDAL IDEATION: "Do you ever have thoughts of hurting or killing yourself?" If Yes, ask:  "Do you have these feelings now?" "Do you have a plan on how you would do this?"     Wife is afraid he will quit dialysis  11. PATIENT SUPPORT: "Who is with you now?" "Who do you live with?" "Do you have family or friends who you can talk to?"        wife 12. OTHER SYMPTOMS: "Do you have any other symptoms?" (e.g., feeling depressed, trouble concentrating, trouble sleeping, trouble breathing, palpitations or fast heartbeat, chest pain,  sweating, nausea, or diarrhea)       Patient is working himself into anxiety over treatment  Protocols used: Anxiety and Panic Attack-A-AH

## 2023-09-20 ENCOUNTER — Ambulatory Visit: Payer: Medicare Other | Admitting: Family Medicine

## 2023-09-20 ENCOUNTER — Encounter: Payer: Self-pay | Admitting: Family Medicine

## 2023-09-20 VITALS — BP 134/60 | HR 77 | Ht 70.0 in | Wt 227.3 lb

## 2023-09-20 DIAGNOSIS — F418 Other specified anxiety disorders: Secondary | ICD-10-CM

## 2023-09-20 DIAGNOSIS — N186 End stage renal disease: Secondary | ICD-10-CM | POA: Diagnosis not present

## 2023-09-20 DIAGNOSIS — N2581 Secondary hyperparathyroidism of renal origin: Secondary | ICD-10-CM | POA: Diagnosis not present

## 2023-09-20 DIAGNOSIS — Z79899 Other long term (current) drug therapy: Secondary | ICD-10-CM | POA: Diagnosis not present

## 2023-09-20 DIAGNOSIS — F4321 Adjustment disorder with depressed mood: Secondary | ICD-10-CM | POA: Diagnosis not present

## 2023-09-20 DIAGNOSIS — Z992 Dependence on renal dialysis: Secondary | ICD-10-CM

## 2023-09-20 DIAGNOSIS — Z4931 Encounter for adequacy testing for hemodialysis: Secondary | ICD-10-CM | POA: Diagnosis not present

## 2023-09-20 DIAGNOSIS — D509 Iron deficiency anemia, unspecified: Secondary | ICD-10-CM | POA: Diagnosis not present

## 2023-09-20 MED ORDER — FLUOXETINE HCL 20 MG PO CAPS: 20.0000 mg | ORAL_CAPSULE | Freq: Every day | ORAL | 3 refills | Status: AC

## 2023-09-20 MED ORDER — DIAZEPAM 5 MG PO TABS: 5.0000 mg | ORAL_TABLET | Freq: Two times a day (BID) | ORAL | 0 refills | Status: AC | PRN

## 2023-09-20 NOTE — Progress Notes (Signed)
Acute visit   Patient: Jeremy Dorsey   DOB: 01-25-1951   73 y.o. Male  MRN: [id number]  Chief Complaint  Patient presents with   Anxiety    Pt reports he is dailysis patient and he recently just made a change. Had to be seen earlier than others and never been in the situation and it freaked him out. Wife reports that he has been having a lot of anxiety period worrying about his health to the point that he has nausea.    Subjective    Discussed the use of AI scribe software for clinical note transcription with the patient, who gave verbal consent to proceed.  History of Present Illness   The patient, with a history of end-stage renal disease on dialysis, presents with significant anxiety related to his recent transition from home dialysis to in-center dialysis. The patient describes the dialysis center environment as distressing, with the sight of other patients and the sound of the machines causing him to feel "freaked out." The patient also reports feeling anxious about the procedures involved in dialysis, particularly the cleaning and maintenance of his new catheter.  The patient's anxiety is so severe that he has been taking Xanax to help him cope with his dialysis sessions. The patient reports that the Xanax helps him to feel less anxious during the procedures, but its effects do not last for the entire duration of his dialysis session, which can last up to five hours.  In addition to his anxiety, the patient also expresses feelings of frustration and sadness related to his dependence on dialysis. The patient reports feeling limited in his abilities and activities due to his health condition, which he finds distressing.  The patient's spouse is present during the conversation and provides additional information about the patient's condition and treatment. The spouse confirms the patient's anxiety and distress related to his dialysis treatment and also mentions concerns about the  patient's future treatment, particularly the possibility of needing more procedures and the potential return to using needles for home dialysis.          09/20/2023    3:14 PM  GAD 7 : Generalized Anxiety Score  Nervous, Anxious, on Edge 1  Control/stop worrying 1  Worry too much - different things 0  Trouble relaxing 0  Restless 1  Easily annoyed or irritable 0  Afraid - awful might happen 0  Total GAD 7 Score 3  Anxiety Difficulty Not difficult at all       09/20/2023    3:13 PM 03/03/2023    2:09 PM 12/16/2022    9:43 AM 01/05/2022    3:34 PM 06/19/2021    9:46 AM  Depression screen PHQ 2/9  Decreased Interest 0 0 0 0 0  Down, Depressed, Hopeless 1 0 0 1 0  PHQ - 2 Score 1 0 0 1 0  Altered sleeping 2  0 1 0  Tired, decreased energy 1  0 2 0  Change in appetite 1  0 0 0  Feeling bad or failure about yourself  0  0 0 0  Trouble concentrating 0  0 0   Moving slowly or fidgety/restless 0  0 0   Suicidal thoughts 0  0 0   PHQ-9 Score 5  0 4 0  Difficult doing work/chores Not difficult at all  Not difficult at all Not difficult at all      Review of Systems  Objective    BP  134/60 (BP Location: Right Arm, Patient Position: Sitting, Cuff Size: Large)   Pulse 77   Ht 5\' 10"  (1.778 m)   Wt 227 lb 4.8 oz (103.1 kg)   SpO2 99%   BMI 32.61 kg/m  Physical Exam Constitutional:      General: He is not in acute distress.    Appearance: Normal appearance. He is not diaphoretic.  HENT:     Head: Normocephalic.  Eyes:     Conjunctiva/sclera: Conjunctivae normal.  Pulmonary:     Effort: Pulmonary effort is normal. No respiratory distress.  Neurological:     Mental Status: He is alert and oriented to person, place, and time. Mental status is at baseline.  Psychiatric:        Mood and Affect: Mood is anxious. Affect is flat.       No results found for any visits on 09/20/23.  Assessment & Plan     Problem List Items Addressed This Visit       Genitourinary   ESRD  on dialysis [facility])   Other Visit Diagnoses       Situational anxiety    -  Primary   Relevant Medications   diazepam (VALIUM) 5 MG tablet   FLUoxetine (PROZAC) 20 MG capsule     Adjustment disorder with depressed mood           Assessment and Plan    ESRD requiring dialysis Undergoing dialysis thrice weekly. Tunnel catheter in place due to complications with fistula, which is swollen and unusable. Discussed ongoing monitoring and potential future procedures for fistula. 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 Temporary relief with Xanax; discussed diazepam (Valium) for longer-lasting relief, suitable for renal condition. Informed about potential side effects, including drowsiness, and need for transportation. - Prescribe diazepam (Valium) 5 mg, to be taken up to twice daily as needed before dialysis - Educate on potential side effects of diazepam, including drowsiness - Advise taking diazepam 30 minutes before dialysis - Ensure transportation to and from dialysis due to potential drowsiness  Adjustment disorder with depressed mood Symptoms of depression and difficulty adjusting to dialysis dependency, exacerbated by fistula complications and tunnel catheter. Discussed Prozac (fluoxetine) for long-term management of depression and anxiety, safe for renal condition. Informed about potential side effects, including initial stomach upset and black box warning for suicidal thoughts. - Prescribe Prozac (fluoxetine) 20 mg daily - Educate on potential side effects of Prozac, including initial stomach upset and black box warning for suicidal thoughts - Advise taking Prozac with food or at bedtime to mitigate stomach upset - Schedule follow-up in 6-8  weeks to assess Prozac effectiveness and adjust as needed  Follow-up - Schedule follow-up with Dr. Roxan Hockey in 6-8 weeks - Send prescriptions to [facility] [address]         Meds ordered this encounter  Medications   diazepam (VALIUM) 5 MG tablet    Sig: Take 1 tablet (5 mg total) by mouth every 12 (twelve) hours as needed for anxiety.    Dispense:  30 tablet    Refill:  0   FLUoxetine (PROZAC) 20 MG capsule    Sig: Take 1 capsule (20 mg total) by mouth daily.    Dispense:  90 capsule    Refill:  3     Return in  about 6 weeks (around 11/01/2023) for MDD/GAD f/u, With PCP, virtual ok.      Shirlee Latch, MD  [facility] [phone] (phone) [phone] (fax)  [facility]

## 2023-09-22 DIAGNOSIS — Z79899 Other long term (current) drug therapy: Secondary | ICD-10-CM | POA: Diagnosis not present

## 2023-09-22 DIAGNOSIS — N2581 Secondary hyperparathyroidism of renal origin: Secondary | ICD-10-CM | POA: Diagnosis not present

## 2023-09-22 DIAGNOSIS — Z992 Dependence on renal dialysis: Secondary | ICD-10-CM | POA: Diagnosis not present

## 2023-09-22 DIAGNOSIS — Z4931 Encounter for adequacy testing for hemodialysis: Secondary | ICD-10-CM | POA: Diagnosis not present

## 2023-09-22 DIAGNOSIS — D509 Iron deficiency anemia, unspecified: Secondary | ICD-10-CM | POA: Diagnosis not present

## 2023-09-22 DIAGNOSIS — N186 End stage renal disease: Secondary | ICD-10-CM | POA: Diagnosis not present

## 2023-09-24 DIAGNOSIS — N186 End stage renal disease: Secondary | ICD-10-CM | POA: Diagnosis not present

## 2023-09-24 DIAGNOSIS — Z79899 Other long term (current) drug therapy: Secondary | ICD-10-CM | POA: Diagnosis not present

## 2023-09-24 DIAGNOSIS — N2581 Secondary hyperparathyroidism of renal origin: Secondary | ICD-10-CM | POA: Diagnosis not present

## 2023-09-24 DIAGNOSIS — D509 Iron deficiency anemia, unspecified: Secondary | ICD-10-CM | POA: Diagnosis not present

## 2023-09-24 DIAGNOSIS — Z992 Dependence on renal dialysis: Secondary | ICD-10-CM | POA: Diagnosis not present

## 2023-09-24 DIAGNOSIS — Z4931 Encounter for adequacy testing for hemodialysis: Secondary | ICD-10-CM | POA: Diagnosis not present

## 2023-09-27 DIAGNOSIS — D509 Iron deficiency anemia, unspecified: Secondary | ICD-10-CM | POA: Diagnosis not present

## 2023-09-27 DIAGNOSIS — Z4931 Encounter for adequacy testing for hemodialysis: Secondary | ICD-10-CM | POA: Diagnosis not present

## 2023-09-27 DIAGNOSIS — N2581 Secondary hyperparathyroidism of renal origin: Secondary | ICD-10-CM | POA: Diagnosis not present

## 2023-09-27 DIAGNOSIS — Z79899 Other long term (current) drug therapy: Secondary | ICD-10-CM | POA: Diagnosis not present

## 2023-09-27 DIAGNOSIS — Z992 Dependence on renal dialysis: Secondary | ICD-10-CM | POA: Diagnosis not present

## 2023-09-27 DIAGNOSIS — N186 End stage renal disease: Secondary | ICD-10-CM | POA: Diagnosis not present

## 2023-09-29 DIAGNOSIS — N2581 Secondary hyperparathyroidism of renal origin: Secondary | ICD-10-CM | POA: Diagnosis not present

## 2023-09-29 DIAGNOSIS — N186 End stage renal disease: Secondary | ICD-10-CM | POA: Diagnosis not present

## 2023-09-29 DIAGNOSIS — D509 Iron deficiency anemia, unspecified: Secondary | ICD-10-CM | POA: Diagnosis not present

## 2023-09-29 DIAGNOSIS — Z4931 Encounter for adequacy testing for hemodialysis: Secondary | ICD-10-CM | POA: Diagnosis not present

## 2023-09-29 DIAGNOSIS — Z79899 Other long term (current) drug therapy: Secondary | ICD-10-CM | POA: Diagnosis not present

## 2023-09-29 DIAGNOSIS — Z992 Dependence on renal dialysis: Secondary | ICD-10-CM | POA: Diagnosis not present

## 2023-09-30 ENCOUNTER — Ambulatory Visit (HOSPITAL_COMMUNITY): Admission: RE | Admit: 2023-09-30 | Payer: Medicare Other | Source: Home / Self Care | Admitting: Nephrology

## 2023-09-30 ENCOUNTER — Encounter (HOSPITAL_COMMUNITY): Admission: RE | Payer: Self-pay | Source: Home / Self Care

## 2023-09-30 SURGERY — A/V FISTULAGRAM
Anesthesia: LOCAL

## 2023-10-01 DIAGNOSIS — N186 End stage renal disease: Secondary | ICD-10-CM | POA: Diagnosis not present

## 2023-10-01 DIAGNOSIS — I129 Hypertensive chronic kidney disease with stage 1 through stage 4 chronic kidney disease, or unspecified chronic kidney disease: Secondary | ICD-10-CM | POA: Diagnosis not present

## 2023-10-01 DIAGNOSIS — Z79899 Other long term (current) drug therapy: Secondary | ICD-10-CM | POA: Diagnosis not present

## 2023-10-01 DIAGNOSIS — D509 Iron deficiency anemia, unspecified: Secondary | ICD-10-CM | POA: Diagnosis not present

## 2023-10-01 DIAGNOSIS — Z4931 Encounter for adequacy testing for hemodialysis: Secondary | ICD-10-CM | POA: Diagnosis not present

## 2023-10-01 DIAGNOSIS — N2581 Secondary hyperparathyroidism of renal origin: Secondary | ICD-10-CM | POA: Diagnosis not present

## 2023-10-01 DIAGNOSIS — Z992 Dependence on renal dialysis: Secondary | ICD-10-CM | POA: Diagnosis not present

## 2023-10-04 DIAGNOSIS — N186 End stage renal disease: Secondary | ICD-10-CM | POA: Diagnosis not present

## 2023-10-04 DIAGNOSIS — N2581 Secondary hyperparathyroidism of renal origin: Secondary | ICD-10-CM | POA: Diagnosis not present

## 2023-10-04 DIAGNOSIS — I953 Hypotension of hemodialysis: Secondary | ICD-10-CM | POA: Diagnosis not present

## 2023-10-04 DIAGNOSIS — Z992 Dependence on renal dialysis: Secondary | ICD-10-CM | POA: Diagnosis not present

## 2023-10-05 ENCOUNTER — Encounter (HOSPITAL_COMMUNITY): Payer: Self-pay | Admitting: Orthopedic Surgery

## 2023-10-05 ENCOUNTER — Other Ambulatory Visit: Payer: Self-pay

## 2023-10-05 NOTE — Progress Notes (Signed)
 SDW CALL  Patient was given pre-op instructions over the phone. The opportunity was given for the patient to ask questions. No further questions asked. Patient verbalized understanding of instructions given.   PCP - [facility] Cardiologist - Dr. Julien Nordmann - clearance note - 08/20/23 Nephrologist - Dr. Zetta Bills  Dialysis - MWF  PPM/ICD - denies Device Orders - n/a Rep Notified - n/a  Chest x-ray - denies EKG - 06/21/23 Stress Test - 04/2022 ECHO - 05/21/23 Cardiac Cath - 04/16/23  Sleep Study - denies CPAP - n/a  Fasting Blood Sugar - unsure - patient does not check blood sugar at home - states it is diet controlled   Last dose of GLP1 agonist-  n/a GLP1 instructions:  n/a  Blood Thinner Instructions:  last dose of Plavix - 2/28 Aspirin Instructions: instructed to continue Aspirin  ERAS Protcol - clears until 0500 PRE-SURGERY Ensure or G2- n/a  COVID TEST- n/a   Anesthesia review: yes  Patient denies shortness of breath, fever, cough and chest pain over the phone call   All instructions explained to the patient, with a verbal understanding of the material. Patient agrees to go over the instructions while at home for a better understanding.

## 2023-10-06 DIAGNOSIS — N184 Chronic kidney disease, stage 4 (severe): Secondary | ICD-10-CM | POA: Diagnosis not present

## 2023-10-06 DIAGNOSIS — D631 Anemia in chronic kidney disease: Secondary | ICD-10-CM | POA: Diagnosis not present

## 2023-10-06 DIAGNOSIS — Z992 Dependence on renal dialysis: Secondary | ICD-10-CM | POA: Diagnosis not present

## 2023-10-06 DIAGNOSIS — R82998 Other abnormal findings in urine: Secondary | ICD-10-CM | POA: Diagnosis not present

## 2023-10-06 DIAGNOSIS — N2581 Secondary hyperparathyroidism of renal origin: Secondary | ICD-10-CM | POA: Diagnosis not present

## 2023-10-06 DIAGNOSIS — K769 Liver disease, unspecified: Secondary | ICD-10-CM | POA: Diagnosis not present

## 2023-10-06 DIAGNOSIS — I953 Hypotension of hemodialysis: Secondary | ICD-10-CM | POA: Diagnosis not present

## 2023-10-06 DIAGNOSIS — N2589 Other disorders resulting from impaired renal tubular function: Secondary | ICD-10-CM | POA: Diagnosis not present

## 2023-10-06 DIAGNOSIS — D509 Iron deficiency anemia, unspecified: Secondary | ICD-10-CM | POA: Diagnosis not present

## 2023-10-06 DIAGNOSIS — N186 End stage renal disease: Secondary | ICD-10-CM | POA: Diagnosis not present

## 2023-10-06 NOTE — Anesthesia Preprocedure Evaluation (Addendum)
 Anesthesia Evaluation  Patient identified by MRN, date of birth, ID band Patient awake    Reviewed: Allergy & Precautions, H&P , NPO status , Patient's Chart, lab work & pertinent test results  Airway Mallampati: III  TM Distance: >3 FB Neck ROM: Full    Dental no notable dental hx.    Pulmonary neg pulmonary ROS, former smoker   Pulmonary exam normal breath sounds clear to auscultation       Cardiovascular hypertension, + CAD, + Cardiac Stents and + Peripheral Vascular Disease  Normal cardiovascular exam Rhythm:Regular Rate:Normal     Neuro/Psych negative neurological ROS  negative psych ROS   GI/Hepatic negative GI ROS, Neg liver ROS,,,  Endo/Other  diabetes    Renal/GU DialysisRenal disease  negative genitourinary   Musculoskeletal negative musculoskeletal ROS (+)    Abdominal   Peds negative pediatric ROS (+)  Hematology  (+) Blood dyscrasia, anemia   Anesthesia Other Findings   Reproductive/Obstetrics negative OB ROS                             Anesthesia Physical Anesthesia Plan  ASA: 3  Anesthesia Plan: General   Post-op Pain Management: Tylenol PO (pre-op)*   Induction: Intravenous  PONV Risk Score and Plan: 1 and 2 and Treatment may vary due to age or medical condition, Ondansetron, Dexamethasone and Propofol infusion  Airway Management Planned: LMA  Additional Equipment: None  Intra-op Plan:   Post-operative Plan: Extubation in OR  Informed Consent: I have reviewed the patients History and Physical, chart, labs and discussed the procedure including the risks, benefits and alternatives for the proposed anesthesia with the patient or authorized representative who has indicated his/her understanding and acceptance.     Dental advisory given  Plan Discussed with: CRNA and Anesthesiologist  Anesthesia Plan Comments: (PAT note written 10/06/2023 by Shonna Chock,  PA-C.   DISCUSSION: Patient is a 73 year old male scheduled for the above procedure.   History includes former smoker (quit 08/23/07), HTN, CAD (NSTEMI 03/01/20; DES to mid and proximal LAD 04/24/20), HLD, DM2, CVA, PAD (right AT/DP angioplasty 01/29/22, right TP trunk/peroneal angioplasty 06/11/22), CKD (started home HD 12/2022), bladder cancer (TURBT 08/01/18), skin cancer (BCC). S/p left internal jugular [facility] 09/16/23. HD MWF.    Last cardiology office visit was on 06/11/23 with Dr. Gala Romney to evaluate for possible infiltrative cardiomyopathy. Widely patent LAD stent with moderate to severe calcified coronaries with no obstructive disease by 04/16/23 LHC. LVEF 55-60%, grade 2 diastolic dysfunction, mild-moderate LVH, severely dilated LA, mild MR by 05/21/23 echo. 06/02/23 cMRI showed LVEF 57%, no focal wall motion abnormality diffuse mid wall late gadolinium enhancement basal left ventricular myocardium with apical sparing. Additional amyloid work-up recommended due to diffuse LGE. There was discussion about SPEP, serum FLC, PYP and genetic testing for TTR, although no results noted. 07/13/23 myocardial amyloid study findings did not sugges (Grade 0) of cardiac ATTR amyloidosis.    Preoperative cardiology input outlined on 08/20/23 by Robin Searing, NP: "Given past medical history and time since last visit, based on ACC/AHA guidelines, CASTEN FLOREN is at acceptable risk for the planned procedure without further cardiovascular testing... Per Dr. Mariah Milling patient can hold Plavix 5 days prior to procedure and should continue ASA 81 mg through the perioperative period." Last Plavix 10/01/23.  )        Anesthesia Quick Evaluation

## 2023-10-06 NOTE — Progress Notes (Signed)
 Anesthesia Chart Review: Jeremy Dorsey  Case: [id number] Date/Time: 10/07/23 0745   Procedure: CARPAL TUNNEL RELEASE (Right: Hand)   Anesthesia type: Choice   Pre-op diagnosis: RIGHT CARPAL TUNNEL SYNDROME   [facility] RO[facility] / [facility]   Surgeons: Jeremy Loa, MD       DISCUSSION: Patient is a 73 year old male scheduled for the above procedure.  History includes former smoker (quit 08/23/07), HTN, CAD (NSTEMI 03/01/20; DES to mid and proximal LAD 04/24/20), HLD, DM2, CVA, PAD (right AT/DP angioplasty 01/29/22, right TP trunk/peroneal angioplasty 06/11/22), CKD (started home HD 12/2022), bladder cancer (TURBT 08/01/18), skin cancer (BCC). S/p left internal jugular [facility] 09/16/23. HD MWF.   Last cardiology office visit was on 06/11/23 with Dr. Gala Dorsey to evaluate for possible infiltrative cardiomyopathy. Widely patent LAD stent with moderate to severe calcified coronaries with no obstructive disease by 04/16/23 LHC. LVEF 55-60%, grade 2 diastolic dysfunction, mild-moderate LVH, severely dilated LA, mild MR by 05/21/23 echo. 06/02/23 cMRI showed LVEF 57%, no focal wall motion abnormality diffuse mid wall late gadolinium enhancement basal left ventricular myocardium with apical sparing. Additional amyloid work-up recommended due to diffuse LGE. There was discussion about SPEP, serum FLC, PYP and genetic testing for TTR, although no results noted. 07/13/23 myocardial amyloid study findings did not sugges (Grade 0) of cardiac ATTR amyloidosis.   Preoperative cardiology input outlined on 08/20/23 by Jeremy Searing, NP: "Given past medical history and time since last visit, based on ACC/AHA guidelines, Jeremy Dorsey is at acceptable risk for the planned procedure without further cardiovascular testing... Per Dr. Mariah Dorsey patient can hold Plavix 5 days prior to procedure and should continue ASA 81 mg through the perioperative period." Last Plavix 10/01/23.   Anesthesia team to evaluate on the day of surgery.     VS:  Wt Readings from Last 3 Encounters:  09/20/23 103.1 kg  09/15/23 103 kg  07/22/23 102.1 kg   BP Readings from Last 3 Encounters:  09/20/23 134/60  09/16/23 (!) 166/83  09/15/23 (!) 142/67   Pulse Readings from Last 3 Encounters:  09/20/23 77  09/17/23 (!) 0  09/15/23 78    PROVIDERS: Jeremy Ramp, MD is PCP  Jeremy Nordmann, MD is cardiologist Jeremy Meres, MD is HF cardiologist Jeremy Bills, MD is nephrologist   LABS: For day of surgery. Last results in [facility] include: Lab Results  Component Value Date   WBC 8.1 06/23/2023   HGB 10.3 (L) 06/23/2023   HCT 30.1 (L) 06/23/2023   PLT 148 (L) 06/23/2023   GLUCOSE 179 (H) 06/22/2023   ALT 24 04/30/2023   AST 19 04/30/2023   NA 139 06/22/2023   K 4.5 06/22/2023   CL 108 06/22/2023   CREATININE 12.16 (H) 06/22/2023   BUN 111 (H) 06/22/2023   CO2 15 (L) 06/22/2023    EKG: 06/21/23: Sinus rhythm Prolonged PR interval Since last tracing no significant change Confirmed by Jeremy Dorsey [phone]) on 06/22/2023 8:28:58 AM   CV: Myocardial Amyloid Imaging Study 07/13/23:   Myocardial uptake was negative for radiotracer uptake. The visual grade of myocardial uptake relative to the ribs was Grade 0 (No myocardial uptake and normal bone uptake). The quantitative H/CL ratio was 1.06. H/CL Ratio is 1.06.   Findings are not suggestive (Grade 0) of cardiac ATTR amyloidosis.   Prior study not available for comparison.    MR Cardiac 06/02/23: IMPRESSION: 1.  Normal LV size and systolic function.  LVEF 57%. 2.  Diffuse midwall basal LGE/scar. 3.  ECV not calculated due to exam ending prematurely as per patient request. 4.  Normal RV size and function. 5. Consider clinical workup for amyloidosis due to diffuse basal LGE (apical sparing)    Echo 05/21/23: IMPRESSIONS   1. Left ventricular ejection fraction, by estimation, is 55 to 60%. The  left ventricle has normal function. The left ventricle has no  regional  wall motion abnormalities. There is mild left ventricular hypertrophy.  Left ventricular diastolic parameters  are consistent with Grade II diastolic dysfunction (pseudonormalization).   2. Right ventricular systolic function is normal. The right ventricular  size is normal.   3. Left atrial size was severely dilated.   4. The mitral valve is normal in structure. Mild mitral valve  regurgitation.   5. The aortic valve was not well visualized. Aortic valve regurgitation  is not visualized. Aortic valve sclerosis is present, with no evidence of  aortic valve stenosis.  - Comparison 03/01/20: LVEF 50-55%, moderate basal inferior wall hypokinesis, grade 1 DD, normal RVSF, mild AV sclerosis.     Cardiac cath 04/16/23:   Dist LAD lesion is 30% stenosed.   Ost LAD to Prox LAD lesion is 30% stenosed.   Ost RCA to Prox RCA lesion is 30% stenosed.   Mid LAD lesion is 10% stenosed.   Mid LAD to Dist LAD lesion is 50% stenosed.   Non-stenotic Prox LAD to Mid LAD lesion was previously treated.   The left ventricular systolic function is normal.   LV end diastolic pressure is moderately elevated.   The left ventricular ejection fraction is 55-65% by visual estimate.   1.  Widely patent LAD stent with minimal restenosis.  Moderately to severely calcified coronary arteries with no obstructive disease. 2.  Normal LV systolic function and moderately elevated left ventricular end-diastolic pressure.   Recommendations: Continue medical therapy for coronary artery disease.   From a cardiac standpoint, I do not see a contraindication for kidney transplant. Avoid catheterization via the right radial artery in the future due to calcified and tortuous innominate artery with very difficult engagement of the coronary arteries.     Past Medical History:  Diagnosis Date   Anemia    Cancer (HCC)    basal cell nose and forehead   Cataract 2016   Cataract surgery bith eyes   CKD (chronic kidney  disease), stage IV (HCC)    Coronary artery disease    a. s/p IVUS-guided DESx2 to prox & mid LAD, residual disease treated medically. EF 50-55% by recent echo 02/2020.   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   Urinary complication     Past Surgical History:  Procedure Laterality Date   ABDOMINAL AORTOGRAM W/LOWER EXTREMITY N/A 01/29/2022   Procedure: ABDOMINAL AORTOGRAM W/ Bilateral LOWER EXTREMITY [place];  Surgeon: Cephus Shelling, MD;  [facility];  Service: Cardiovascular;  Laterality: N/A;   ABDOMINAL AORTOGRAM W/LOWER EXTREMITY N/A 06/11/2022   Procedure: ABDOMINAL AORTOGRAM W/LOWER EXTREMITY;  Surgeon: Cephus Shelling, MD;  [facility];  Service: Cardiovascular;  Laterality: N/A;   AV FISTULA PLACEMENT Left 10/31/2020   Procedure: LEFT UPPER EXTREMITY ARTERIOVENOUS (AV) FISTULA CREATION;  Surgeon: Maeola Harman, MD;  Location: [facility] OR;  Service: Vascular;  Laterality: Left;  AV FISTULA PLACEMENT Left 07/09/2021   Procedure: LEFT ARM ARTERIOVENOUS (AV) FISTULA CREATION;  Surgeon: Nada Libman, MD;  [facility];  Service: Vascular;  Laterality: Left;   COLONOSCOPY  1990   COLONOSCOPY  06/09/2011   Dr Lemar Livings   CORONARY STENT INTERVENTION N/A 04/24/2020   Procedure: CORONARY STENT INTERVENTION;  Surgeon: Iran Ouch, MD;  [facility];  Service: Cardiovascular;  Laterality: N/A;   CORONARY ULTRASOUND/IVUS N/A 04/24/2020   Procedure: Intravascular Ultrasound/IVUS;  Surgeon: Iran Ouch, MD;  [facility];  Service: Cardiovascular;  Laterality: N/A;   CYSTOSCOPY W/ RETROGRADES Bilateral 08/01/2018   Procedure: CYSTOSCOPY WITH RETROGRADE PYELOGRAM;  Surgeon: Sondra Come, MD;  Location: [facility] ORS;  Service: Urology;   Laterality: Bilateral;   CYSTOSCOPY WITH BIOPSY N/A 08/01/2018   Procedure: CYSTOSCOPY WITH Bladder BIOPSY;  Surgeon: Sondra Come, MD;  Location: [facility] ORS;  Service: Urology;  Laterality: N/A;   CYSTOSCOPY WITH STENT PLACEMENT Right 08/01/2018   Procedure: CYSTOSCOPY WITH STENT PLACEMENT;  Surgeon: Sondra Come, MD;  Location: [facility] ORS;  Service: Urology;  Laterality: Right;   DIALYSIS/PERMA CATHETER INSERTION Left 09/16/2023   Procedure: DIALYSIS/PERMA CATHETER INSERTION;  Surgeon: [name], MD;  Location: [facility] INVASIVE CV LAB;  Service: Cardiovascular;  Laterality: Left;   EYE SURGERY Right    laser surgery   FRACTURE SURGERY     Rt shoulder rotaor cuff   IR THROMBECTOMY AV FISTULA W/THROMBOLYSIS/PTA INC/SHUNT/IMG LEFT Left 06/22/2023   IR US GUIDE VASC ACCESS LEFT  06/22/2023   LEFT HEART CATH AND CORONARY ANGIOGRAPHY N/A 04/24/2020   Procedure: LEFT HEART CATH AND CORONARY ANGIOGRAPHY;  Surgeon: Iran Ouch, MD;  [facility];  Service: Cardiovascular;  Laterality: N/A;   LEFT HEART CATH AND CORONARY ANGIOGRAPHY Left 04/16/2023   Procedure: LEFT HEART CATH AND CORONARY ANGIOGRAPHY;  Surgeon: Iran Ouch, MD;  Location: [facility] INVASIVE CV LAB;  Service: Cardiovascular;  Laterality: Left;   PERIPHERAL VASCULAR BALLOON ANGIOPLASTY Right 01/29/2022   Procedure: PERIPHERAL VASCULAR BALLOON ANGIOPLASTY;  Surgeon: Cephus Shelling, MD;  [facility];  Service: Cardiovascular;  Laterality: Right;   PERIPHERAL VASCULAR BALLOON ANGIOPLASTY Right 06/11/2022   Procedure: PERIPHERAL VASCULAR BALLOON ANGIOPLASTY;  Surgeon: Cephus Shelling, MD;  [facility];  Service: Cardiovascular;  Laterality: Right;  Peroneal   PERIPHERAL VASCULAR BALLOON ANGIOPLASTY  09/15/2023   Procedure: PERIPHERAL VASCULAR BALLOON ANGIOPLASTY;  Surgeon: Dagoberto Ligas, MD;  Location: [facility] INVASIVE CV LAB;  Service: Cardiovascular;;  Outflow/Inflow  Cephalic Vein   PERIPHERAL VASCULAR INTERVENTION  09/15/2023   Procedure: PERIPHERAL VASCULAR INTERVENTION;  Surgeon: Dagoberto Ligas, MD;  Location: [facility] INVASIVE CV LAB;  Service: Cardiovascular;;  8x10 Viabahn   PERIPHERAL VASCULAR THROMBECTOMY N/A 09/15/2023   Procedure: PERIPHERAL VASCULAR THROMBECTOMY;  Surgeon: Dagoberto Ligas, MD;  Location: [facility] INVASIVE CV LAB;  Service: Cardiovascular;  Laterality: N/A;   SHOULDER ARTHROSCOPY WITH OPEN ROTATOR CUFF REPAIR Right 08/25/2018   Procedure: SHOULDER ARTHROSCOPY WITH MINI OPEN ROTATOR CUFF REPAIR;  Surgeon: Juanell Fairly, MD;  Location: [facility] ORS;  Service: Orthopedics;  Laterality: Right;   TRANSURETHRAL RESECTION OF BLADDER TUMOR N/A 08/01/2018   Procedure: TRANSURETHRAL RESECTION OF BLADDER TUMOR (TURBT);  Surgeon: Sondra Come, MD;  Location: [facility] ORS;  Service: Urology;  Laterality: N/A;   URETEROSCOPY WITH HOLMIUM LASER LITHOTRIPSY Right 08/01/2018   Procedure: URETEROSCOPY WITH HOLMIUM LASER LITHOTRIPSY;  Surgeon: Richardo Hanks,  Laurette Schimke, MD;  Location: [facility] ORS;  Service: Urology;  Laterality: Right;    MEDICATIONS: No current facility-administered medications for this encounter.    aspirin EC (ASPIRIN LOW DOSE) 81 MG tablet   atorvastatin (LIPITOR) 80 MG tablet   AURYXIA 1 GM 210 MG(Fe) tablet   calcitRIOL (ROCALTROL) 0.25 MCG capsule   carvedilol (COREG) 12.5 MG tablet   clopidogrel (PLAVIX) 75 MG tablet   diazepam (VALIUM) 5 MG tablet   FLUoxetine (PROZAC) 20 MG capsule   lidocaine-prilocaine (EMLA) cream   Methoxy PEG-Epoetin Beta (MIRCERA IJ)   multivitamin (RENA-VIT) TABS tablet   nitroGLYCERIN (NITROSTAT) 0.4 MG SL tablet   sildenafil (VIAGRA) 100 MG tablet   torsemide (DEMADEX) 100 MG tablet    Shonna Chock, PA-C Surgical Short Stay/Anesthesiology [facility] Phone [phone] [facility] Phone [phone] 10/06/2023 11:38 AM

## 2023-10-07 ENCOUNTER — Encounter (HOSPITAL_COMMUNITY)
Admission: RE | Disposition: A | Discharge status: Home / Self Care | Payer: Self-pay | Source: Home / Self Care | Attending: Orthopedic Surgery

## 2023-10-07 ENCOUNTER — Ambulatory Visit (HOSPITAL_BASED_OUTPATIENT_CLINIC_OR_DEPARTMENT_OTHER): Payer: Self-pay | Admitting: Vascular Surgery

## 2023-10-07 ENCOUNTER — Other Ambulatory Visit: Payer: Self-pay

## 2023-10-07 ENCOUNTER — Ambulatory Visit (HOSPITAL_COMMUNITY)
Admission: RE | Admit: 2023-10-07 | Discharge: 2023-10-07 | Disposition: A | Discharge status: Home / Self Care | Payer: Medicare Other | Attending: Orthopedic Surgery | Admitting: Orthopedic Surgery

## 2023-10-07 ENCOUNTER — Ambulatory Visit (HOSPITAL_COMMUNITY): Payer: Self-pay | Admitting: Vascular Surgery

## 2023-10-07 ENCOUNTER — Encounter (HOSPITAL_COMMUNITY): Payer: Self-pay | Admitting: Orthopedic Surgery

## 2023-10-07 DIAGNOSIS — Z955 Presence of coronary angioplasty implant and graft: Secondary | ICD-10-CM | POA: Insufficient documentation

## 2023-10-07 DIAGNOSIS — E119 Type 2 diabetes mellitus without complications: Secondary | ICD-10-CM

## 2023-10-07 DIAGNOSIS — E785 Hyperlipidemia, unspecified: Secondary | ICD-10-CM | POA: Insufficient documentation

## 2023-10-07 DIAGNOSIS — Z87891 Personal history of nicotine dependence: Secondary | ICD-10-CM | POA: Insufficient documentation

## 2023-10-07 DIAGNOSIS — N184 Chronic kidney disease, stage 4 (severe): Secondary | ICD-10-CM | POA: Diagnosis not present

## 2023-10-07 DIAGNOSIS — I10 Essential (primary) hypertension: Secondary | ICD-10-CM

## 2023-10-07 DIAGNOSIS — Z7902 Long term (current) use of antithrombotics/antiplatelets: Secondary | ICD-10-CM | POA: Diagnosis not present

## 2023-10-07 DIAGNOSIS — E1122 Type 2 diabetes mellitus with diabetic chronic kidney disease: Secondary | ICD-10-CM | POA: Diagnosis not present

## 2023-10-07 DIAGNOSIS — G5601 Carpal tunnel syndrome, right upper limb: Secondary | ICD-10-CM

## 2023-10-07 DIAGNOSIS — Z992 Dependence on renal dialysis: Secondary | ICD-10-CM | POA: Insufficient documentation

## 2023-10-07 DIAGNOSIS — N186 End stage renal disease: Secondary | ICD-10-CM | POA: Diagnosis not present

## 2023-10-07 DIAGNOSIS — I120 Hypertensive chronic kidney disease with stage 5 chronic kidney disease or end stage renal disease: Secondary | ICD-10-CM | POA: Insufficient documentation

## 2023-10-07 DIAGNOSIS — D631 Anemia in chronic kidney disease: Secondary | ICD-10-CM | POA: Insufficient documentation

## 2023-10-07 DIAGNOSIS — I129 Hypertensive chronic kidney disease with stage 1 through stage 4 chronic kidney disease, or unspecified chronic kidney disease: Secondary | ICD-10-CM | POA: Insufficient documentation

## 2023-10-07 DIAGNOSIS — I2510 Atherosclerotic heart disease of native coronary artery without angina pectoris: Secondary | ICD-10-CM | POA: Insufficient documentation

## 2023-10-07 DIAGNOSIS — E1151 Type 2 diabetes mellitus with diabetic peripheral angiopathy without gangrene: Secondary | ICD-10-CM | POA: Diagnosis not present

## 2023-10-07 DIAGNOSIS — Z79899 Other long term (current) drug therapy: Secondary | ICD-10-CM | POA: Insufficient documentation

## 2023-10-07 DIAGNOSIS — Z7982 Long term (current) use of aspirin: Secondary | ICD-10-CM | POA: Insufficient documentation

## 2023-10-07 LAB — GLUCOSE, CAPILLARY
Glucose-Capillary: 147 mg/dL — ABNORMAL HIGH (ref 70–99)
Glucose-Capillary: 160 mg/dL — ABNORMAL HIGH (ref 70–99)

## 2023-10-07 LAB — POCT I-STAT, CHEM 8
BUN: 35 mg/dL — ABNORMAL HIGH (ref 8–23)
Calcium, Ion: 1.24 mmol/L (ref 1.15–1.40)
Chloride: 98 mmol/L (ref 98–111)
Creatinine, Ser: 6.3 mg/dL — ABNORMAL HIGH (ref 0.61–1.24)
Glucose, Bld: 168 mg/dL — ABNORMAL HIGH (ref 70–99)
HCT: 28 % — ABNORMAL LOW (ref 39.0–52.0)
Hemoglobin: 9.5 g/dL — ABNORMAL LOW (ref 13.0–17.0)
Potassium: 3.8 mmol/L (ref 3.5–5.1)
Sodium: 134 mmol/L — ABNORMAL LOW (ref 135–145)
TCO2: 25 mmol/L (ref 22–32)

## 2023-10-07 SURGERY — CARPAL TUNNEL RELEASE
Anesthesia: General | Site: Hand | Laterality: Right

## 2023-10-07 MED ORDER — OXYCODONE HCL 5 MG PO TABS: 5.0000 mg | ORAL_TABLET | Freq: Once | ORAL | Status: DC | PRN

## 2023-10-07 MED ORDER — ACETAMINOPHEN 160 MG/5ML PO SOLN: 325.0000 mg | ORAL | Status: DC | PRN

## 2023-10-07 MED ORDER — ONDANSETRON HCL 4 MG/2ML IJ SOLN: 4.0000 mg | Freq: Once | INTRAMUSCULAR | Status: DC | PRN

## 2023-10-07 MED ORDER — TRAMADOL HCL 50 MG PO TABS: ORAL_TABLET | ORAL | 0 refills | Status: DC

## 2023-10-07 MED ORDER — ONDANSETRON HCL 4 MG/2ML IJ SOLN
INTRAMUSCULAR | Status: DC | PRN
  Administered 2023-10-07: 4 mg via INTRAVENOUS

## 2023-10-07 MED ORDER — ACETAMINOPHEN 500 MG PO TABS
1000.0000 mg | ORAL_TABLET | Freq: Once | ORAL | Status: AC
  Administered 2023-10-07: 1000 mg via ORAL
  Filled 2023-10-07: qty 2

## 2023-10-07 MED ORDER — PROPOFOL 10 MG/ML IV BOLUS
INTRAVENOUS | Status: AC
  Filled 2023-10-07: qty 20

## 2023-10-07 MED ORDER — LACTATED RINGERS IV SOLN: INTRAVENOUS | Status: DC

## 2023-10-07 MED ORDER — BUPIVACAINE HCL (PF) 0.25 % IJ SOLN
INTRAMUSCULAR | Status: DC | PRN
  Administered 2023-10-07: 9 mL

## 2023-10-07 MED ORDER — PROPOFOL 10 MG/ML IV BOLUS
INTRAVENOUS | Status: DC | PRN
  Administered 2023-10-07: 250 mg via INTRAVENOUS

## 2023-10-07 MED ORDER — HEPARIN SODIUM (PORCINE) 1000 UNIT/ML IJ SOLN
3000.0000 [IU] | Freq: Once | INTRAMUSCULAR | Status: AC
  Administered 2023-10-07: 3000 [IU]

## 2023-10-07 MED ORDER — SODIUM CHLORIDE 0.9 % IV SOLN
INTRAVENOUS | Status: DC | PRN
Start: 2023-10-07 — End: 2023-10-07

## 2023-10-07 MED ORDER — BUPIVACAINE HCL (PF) 0.25 % IJ SOLN
INTRAMUSCULAR | Status: AC
  Filled 2023-10-07: qty 30

## 2023-10-07 MED ORDER — HEPARIN SODIUM (PORCINE) 1000 UNIT/ML IJ SOLN
INTRAMUSCULAR | Status: AC
  Filled 2023-10-07: qty 3

## 2023-10-07 MED ORDER — FENTANYL CITRATE (PF) 250 MCG/5ML IJ SOLN
INTRAMUSCULAR | Status: AC
  Filled 2023-10-07: qty 5

## 2023-10-07 MED ORDER — LIDOCAINE 2% (20 MG/ML) 5 ML SYRINGE
INTRAMUSCULAR | Status: DC | PRN
  Administered 2023-10-07: 80 mg via INTRAVENOUS

## 2023-10-07 MED ORDER — CHLORHEXIDINE GLUCONATE 0.12 % MT SOLN
15.0000 mL | Freq: Once | OROMUCOSAL | Status: AC
  Administered 2023-10-07: 15 mL via OROMUCOSAL
  Filled 2023-10-07: qty 15

## 2023-10-07 MED ORDER — FENTANYL CITRATE (PF) 100 MCG/2ML IJ SOLN: 25.0000 ug | INTRAMUSCULAR | Status: DC | PRN

## 2023-10-07 MED ORDER — 0.9 % SODIUM CHLORIDE (POUR BTL) OPTIME
TOPICAL | Status: DC | PRN
  Administered 2023-10-07: 1000 mL

## 2023-10-07 MED ORDER — SODIUM CHLORIDE 0.9% FLUSH: 10.0000 mL | Freq: Two times a day (BID) | INTRAVENOUS | Status: DC

## 2023-10-07 MED ORDER — PHENYLEPHRINE 80 MCG/ML (10ML) SYRINGE FOR IV PUSH (FOR BLOOD PRESSURE SUPPORT)
PREFILLED_SYRINGE | INTRAVENOUS | Status: DC | PRN
  Administered 2023-10-07 (×2): 80 ug via INTRAVENOUS
  Administered 2023-10-07: 160 ug via INTRAVENOUS
  Administered 2023-10-07: 80 ug via INTRAVENOUS
  Administered 2023-10-07: 160 ug via INTRAVENOUS

## 2023-10-07 MED ORDER — ORAL CARE MOUTH RINSE: 15.0000 mL | Freq: Once | OROMUCOSAL | Status: AC

## 2023-10-07 MED ORDER — MEPERIDINE HCL 25 MG/ML IJ SOLN: 6.2500 mg | INTRAMUSCULAR | Status: DC | PRN

## 2023-10-07 MED ORDER — CEFAZOLIN SODIUM-DEXTROSE 2-4 GM/100ML-% IV SOLN
2.0000 g | INTRAVENOUS | Status: AC
  Administered 2023-10-07: 2 g via INTRAVENOUS
  Filled 2023-10-07: qty 100

## 2023-10-07 MED ORDER — ACETAMINOPHEN 325 MG PO TABS: 325.0000 mg | ORAL_TABLET | ORAL | Status: DC | PRN

## 2023-10-07 MED ORDER — OXYCODONE HCL 5 MG/5ML PO SOLN: 5.0000 mg | Freq: Once | ORAL | Status: DC | PRN

## 2023-10-07 MED ORDER — SODIUM CHLORIDE 0.9% FLUSH: 10.0000 mL | INTRAVENOUS | Status: DC | PRN

## 2023-10-07 MED ORDER — CHLORHEXIDINE GLUCONATE CLOTH 2 % EX PADS: 6.0000 | MEDICATED_PAD | Freq: Every day | CUTANEOUS | Status: DC

## 2023-10-07 SURGICAL SUPPLY — 35 items
BAG COUNTER SPONGE SURGICOUNT (BAG) ×1 IMPLANT
BNDG ELASTIC 3INX 5YD STR LF (GAUZE/BANDAGES/DRESSINGS) ×1 IMPLANT
BNDG ELASTIC 4X5.8 VLCR STR LF (GAUZE/BANDAGES/DRESSINGS) ×1 IMPLANT
BNDG ESMARK 4X9 LF (GAUZE/BANDAGES/DRESSINGS) ×1 IMPLANT
BNDG GAUZE DERMACEA FLUFF 4 (GAUZE/BANDAGES/DRESSINGS) ×1 IMPLANT
CHLORAPREP W/TINT 26 (MISCELLANEOUS) ×1 IMPLANT
CORD BIPOLAR FORCEPS 12FT (ELECTRODE) ×1 IMPLANT
COVER SURGICAL LIGHT HANDLE (MISCELLANEOUS) ×1 IMPLANT
CUFF TOURN SGL QUICK 18X4 (TOURNIQUET CUFF) ×1 IMPLANT
CUFF TRNQT CYL 24X4X16.5-23 (TOURNIQUET CUFF) IMPLANT
DRAPE SURG 17X23 STRL (DRAPES) ×1 IMPLANT
GAUZE PAD ABD 8X10 STRL (GAUZE/BANDAGES/DRESSINGS) ×2 IMPLANT
GAUZE SPONGE 4X4 12PLY STRL (GAUZE/BANDAGES/DRESSINGS) ×1 IMPLANT
GAUZE XEROFORM 1X8 LF (GAUZE/BANDAGES/DRESSINGS) ×1 IMPLANT
GLOVE BIO SURGEON STRL SZ7.5 (GLOVE) ×2 IMPLANT
GLOVE BIOGEL PI IND STRL 8 (GLOVE) ×1 IMPLANT
GOWN STRL REUS W/ TWL LRG LVL3 (GOWN DISPOSABLE) ×2 IMPLANT
KIT BASIN OR (CUSTOM PROCEDURE TRAY) ×1 IMPLANT
KIT TURNOVER KIT B (KITS) ×1 IMPLANT
NDL HYPO 25GX1X1/2 BEV (NEEDLE) IMPLANT
NEEDLE HYPO 25GX1X1/2 BEV (NEEDLE) ×1 IMPLANT
NS IRRIG 1000ML POUR BTL (IV SOLUTION) ×1 IMPLANT
PACK ORTHO EXTREMITY (CUSTOM PROCEDURE TRAY) ×1 IMPLANT
PAD ARMBOARD 7.5X6 YLW CONV (MISCELLANEOUS) ×2 IMPLANT
PADDING CAST ABS COTTON 4X4 ST (CAST SUPPLIES) ×1 IMPLANT
SPONGE T-LAP 4X18 ~~LOC~~+RFID (SPONGE) ×1 IMPLANT
SUCTION TUBE FRAZIER 10FR DISP (SUCTIONS) IMPLANT
SUT ETHILON 3 0 PS 1 (SUTURE) ×1 IMPLANT
SUT VIC AB 3-0 SH 18 (SUTURE) ×1 IMPLANT
SYR CONTROL 10ML LL (SYRINGE) IMPLANT
TOWEL GREEN STERILE (TOWEL DISPOSABLE) ×1 IMPLANT
TOWEL GREEN STERILE FF (TOWEL DISPOSABLE) ×1 IMPLANT
TUBE CONNECTING 12X1/4 (SUCTIONS) IMPLANT
UNDERPAD 30X36 HEAVY ABSORB (UNDERPADS AND DIAPERS) ×1 IMPLANT
WATER STERILE IRR 1000ML POUR (IV SOLUTION) ×1 IMPLANT

## 2023-10-07 NOTE — Anesthesia Postprocedure Evaluation (Signed)
 Anesthesia Post Note  Patient: Jeremy Dorsey  Procedure(s) Performed: CARPAL TUNNEL RELEASE (Right: Hand)     Patient location during evaluation: PACU Anesthesia Type: General Level of consciousness: awake and alert Pain management: pain level controlled Vital Signs Assessment: post-procedure vital signs reviewed and stable Respiratory status: spontaneous breathing, nonlabored ventilation, respiratory function stable and patient connected to nasal cannula oxygen Cardiovascular status: blood pressure returned to baseline and stable Postop Assessment: no apparent nausea or vomiting Anesthetic complications: no   No notable events documented.  Last Vitals:  Vitals:   10/07/23 0930 10/07/23 0935  BP: (!) 100/49 (!) 115/55  Pulse: 74 75  Resp: 18 16  Temp:  36.6 C  SpO2: 91% 95%    Last Pain:  Vitals:   10/07/23 0935  TempSrc:   PainSc: 0-No pain                 Chassie Pennix

## 2023-10-07 NOTE — Op Note (Signed)
 10/07/2023 [facility] OR                              OPERATIVE REPORT   PREOPERATIVE DIAGNOSIS:  Right carpal tunnel syndrome  POSTOPERATIVE DIAGNOSIS:  Right carpal tunnel syndrome  PROCEDURE:  Right carpal tunnel release  SURGEON:  Betha Loa, MD  ASSISTANT:  none.  ANESTHESIA: General  IV FLUIDS:  Per anesthesia flow sheet  ESTIMATED BLOOD LOSS:  Minimal  COMPLICATIONS:  None  SPECIMENS:  None  TOURNIQUET TIME:    Total Tourniquet Time Documented: Upper Arm (Right) - 14 minutes Total: Upper Arm (Right) - 14 minutes   DISPOSITION:  Stable to PACU  LOCATION: [facility] OR  INDICATIONS:  73 y.o. yo male with numbness and tingling right hand.  Nocturnal symptoms. Positive nerve conduction studies. He wishes to proceed with right carpal tunnel release.  Risks, benefits and alternatives of surgery were discussed including the risk of blood loss; infection; damage to nerves, vessels, tendons, ligaments, bone; failure of surgery; need for additional surgery; complications with wound healing; continued pain; recurrence of carpal tunnel syndrome; and damage to motor branch. He voiced understanding of these risks and elected to proceed.   OPERATIVE COURSE:  After being identified preoperatively by myself, the patient and I agreed upon the procedure and site of procedure.  The surgical site was marked.  Surgical consent had been signed.  He was given IV Ancef as preoperative antibiotic prophylaxis.  He was transferred to the operating room and placed on the operating room table in supine position with the Right upper extremity on an armboard.  General anesthesia was induced by the anesthesiologist.  Right upper extremity was prepped and draped in normal sterile orthopaedic fashion.  A surgical pause was performed between the surgeons, anesthesia, and operating room staff, and all were in agreement as to the patient, procedure, and site of procedure.  Tourniquet at the proximal aspect of the extremity  was inflated to 250 mmHg after exsanguination of the arm with an Esmarch bandage  Incision was made over the transverse carpal ligament and carried into the subcutaneous tissues by spreading technique.  Bipolar electrocautery was used to obtain hemostasis.  The palmar fascia was sharply incised.  The transverse carpal ligament was identified.  The fascia distal to the ligament was opened.  Retractor was placed and the flexor tendons were identified.  The flexor tendon to the little finger was identified and retracted radially.  The transverse carpal ligament was then incised from distal to proximal under direct visualization.  Scissors were used to split the distal aspect of the volar antebrachial fascia.  A finger was placed into the wound to ensure complete decompression, which was the case.  The nerve was examined.  It was flattened and hyperemic. It was adherent to the radial leaflet.  The motor branch was identified and was intact.  The wound was copiously irrigated with sterile saline.  It was then closed with 4-0 nylon in a horizontal mattress fashion.  It was injected with 0.25% plain Marcaine to aid in postoperative analgesia.  It was dressed with sterile Xeroform, 4x4s, an ABD, and wrapped with Kerlix and an Ace bandage.  Tourniquet was deflated at 14 minutes.  Fingertips were pink with brisk capillary refill after deflation of the tourniquet.  Operative drapes were broken down.  The patient was awoken from anesthesia safely.  He was transferred back to stretcher and taken to the PACU in  stable condition.  I will see him back in the office in 1 week for postoperative followup.  I will give him a prescription for Tramadol 50 mg 1 tab PO q6 hours prn pain, dispense # 20.    Betha Loa, MD Electronically signed, 10/07/23

## 2023-10-07 NOTE — Discharge Instructions (Addendum)
 [facility] Instructions Hand Surgery  Wound Care: Keep your hand elevated above the level of your heart.  Do not allow it to dangle by your side.  Keep the dressing dry and do not remove it unless your doctor advises you to do so.  He will usually change it at the time of your post-op visit.  Moving your fingers is advised to stimulate circulation but will depend on the site of your surgery.  If you have a splint applied, your doctor will advise you regarding movement.  Activity: Do not drive or operate machinery today.  Rest today and then you may return to your normal activity and work as indicated by your physician.  Diet:  Drink liquids today or eat a light diet.  You may resume a regular diet tomorrow.    General expectations: Pain for two to three days. Fingers may become slightly swollen.  Call your doctor if any of the following occur: Severe pain not relieved by pain medication. Elevated temperature. Dressing soaked with blood. Inability to move fingers. White or bluish color to fingers.       No acetaminophen/Tylenol until after 12:20 pm today if needed.       Post Anesthesia Home Care Instructions  Activity: Get plenty of rest for the remainder of the day. A responsible individual must stay with you for 24 hours following the procedure.  For the next 24 hours, DO NOT: -Drive a car -Advertising copywriter -Drink alcoholic beverages -Take any medication unless instructed by your physician -Make any legal decisions or sign important papers.  Meals: Start with liquid foods such as gelatin or soup. Progress to regular foods as tolerated. Avoid greasy, spicy, heavy foods. If nausea and/or vomiting occur, drink only clear liquids until the nausea and/or vomiting subsides. Call your physician if vomiting continues.  Special Instructions/Symptoms: Your throat may feel dry or sore from the anesthesia or the breathing tube placed in your throat during surgery. If this  causes discomfort, gargle with warm salt water. The discomfort should disappear within 24 hours.

## 2023-10-07 NOTE — H&P (Signed)
 Jeremy Dorsey is an 73 y.o. male.   Chief Complaint: carpal tunnel syndrome HPI: 73 y.o. yo male with numbness and tingling right hand.  Nocturnal symptoms. Positive nerve conduction studies. He wishes to have right carpal tunnel release.   Allergies:  Allergies  Allergen Reactions   Oxycodone Nausea And Vomiting   Hydrocodone Nausea And Vomiting    Past Medical History:  Diagnosis Date   Anemia    Cancer (HCC)    basal cell nose and forehead   Cataract 2016   Cataract surgery bith eyes   CKD (chronic kidney disease), stage IV (HCC)    Coronary artery disease    a. s/p IVUS-guided DESx2 to prox & mid LAD, residual disease treated medically. EF 50-55% by recent echo 02/2020.   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   Urinary complication     Past Surgical History:  Procedure Laterality Date   ABDOMINAL AORTOGRAM W/LOWER EXTREMITY N/A 01/29/2022   Procedure: ABDOMINAL AORTOGRAM W/ Bilateral LOWER EXTREMITY Runoff;  Surgeon: Cephus Shelling, MD;  [facility];  Service: Cardiovascular;  Laterality: N/A;   ABDOMINAL AORTOGRAM W/LOWER EXTREMITY N/A 06/11/2022   Procedure: ABDOMINAL AORTOGRAM W/LOWER EXTREMITY;  Surgeon: Cephus Shelling, MD;  [facility];  Service: Cardiovascular;  Laterality: N/A;   AV FISTULA PLACEMENT Left 10/31/2020   Procedure: LEFT UPPER EXTREMITY ARTERIOVENOUS (AV) FISTULA CREATION;  Surgeon: Maeola Harman, MD;  [facility];  Service: Vascular;  Laterality: Left;   AV FISTULA PLACEMENT Left 07/09/2021   Procedure: LEFT ARM ARTERIOVENOUS (AV) FISTULA CREATION;  Surgeon: Nada Libman, MD;  [facility];  Service: Vascular;  Laterality: Left;   COLONOSCOPY  1990   COLONOSCOPY  06/09/2011   Dr Lemar Livings   CORONARY STENT INTERVENTION N/A  04/24/2020   Procedure: CORONARY STENT INTERVENTION;  Surgeon: Iran Ouch, MD;  [facility];  Service: Cardiovascular;  Laterality: N/A;   CORONARY ULTRASOUND/IVUS N/A 04/24/2020   Procedure: Intravascular Ultrasound/IVUS;  Surgeon: Iran Ouch, MD;  [facility];  Service: Cardiovascular;  Laterality: N/A;   CYSTOSCOPY W/ RETROGRADES Bilateral 08/01/2018   Procedure: CYSTOSCOPY WITH RETROGRADE PYELOGRAM;  Surgeon: Sondra Come, MD;  Location: [facility] ORS;  Service: Urology;  Laterality: Bilateral;   CYSTOSCOPY WITH BIOPSY N/A 08/01/2018   Procedure: CYSTOSCOPY WITH Bladder BIOPSY;  Surgeon: Sondra Come, MD;  Location: [facility] ORS;  Service: Urology;  Laterality: N/A;   CYSTOSCOPY WITH STENT PLACEMENT Right 08/01/2018   Procedure: CYSTOSCOPY WITH STENT PLACEMENT;  Surgeon: Sondra Come, MD;  Location: [facility] ORS;  Service: Urology;  Laterality: Right;   DIALYSIS/PERMA CATHETER INSERTION Left 09/16/2023   Procedure: DIALYSIS/PERMA CATHETER INSERTION;  Surgeon: [name], MD;  Location: [facility] INVASIVE CV LAB;  Service: Cardiovascular;  Laterality: Left;   EYE SURGERY Right    laser surgery   FRACTURE SURGERY     Rt shoulder rotaor cuff   IR THROMBECTOMY AV FISTULA W/THROMBOLYSIS/PTA INC/SHUNT/IMG LEFT Left 06/22/2023   IR US GUIDE VASC ACCESS LEFT  06/22/2023   LEFT HEART CATH AND CORONARY ANGIOGRAPHY N/A 04/24/2020   Procedure: LEFT HEART CATH AND CORONARY ANGIOGRAPHY;  Surgeon: Iran Ouch, MD;  [facility];  Service: Cardiovascular;  Laterality: N/A;   LEFT HEART CATH AND CORONARY ANGIOGRAPHY Left 04/16/2023   Procedure: LEFT HEART CATH AND CORONARY ANGIOGRAPHY;  Surgeon: Iran Ouch, MD;  Location: [facility];  Service: Cardiovascular;  Laterality: Left;   PERIPHERAL VASCULAR BALLOON ANGIOPLASTY Right 01/29/2022   Procedure: PERIPHERAL VASCULAR BALLOON ANGIOPLASTY;  Surgeon: Cephus Shelling, MD;   [facility];  Service: Cardiovascular;  Laterality: Right;   PERIPHERAL VASCULAR BALLOON ANGIOPLASTY Right 06/11/2022   Procedure: PERIPHERAL VASCULAR BALLOON ANGIOPLASTY;  Surgeon: Cephus Shelling, MD;  [facility];  Service: Cardiovascular;  Laterality: Right;  Peroneal   PERIPHERAL VASCULAR BALLOON ANGIOPLASTY  09/15/2023   Procedure: PERIPHERAL VASCULAR BALLOON ANGIOPLASTY;  Surgeon: Dagoberto Ligas, MD;  Location: [facility] INVASIVE CV LAB;  Service: Cardiovascular;;  Outflow/Inflow Cephalic Vein   PERIPHERAL VASCULAR INTERVENTION  09/15/2023   Procedure: PERIPHERAL VASCULAR INTERVENTION;  Surgeon: Dagoberto Ligas, MD;  Location: [facility] INVASIVE CV LAB;  Service: Cardiovascular;;  8x10 Viabahn   PERIPHERAL VASCULAR THROMBECTOMY N/A 09/15/2023   Procedure: PERIPHERAL VASCULAR THROMBECTOMY;  Surgeon: Dagoberto Ligas, MD;  Location: [facility] INVASIVE CV LAB;  Service: Cardiovascular;  Laterality: N/A;   SHOULDER ARTHROSCOPY WITH OPEN ROTATOR CUFF REPAIR Right 08/25/2018   Procedure: SHOULDER ARTHROSCOPY WITH MINI OPEN ROTATOR CUFF REPAIR;  Surgeon: Juanell Fairly, MD;  Location: [facility] ORS;  Service: Orthopedics;  Laterality: Right;   TRANSURETHRAL RESECTION OF BLADDER TUMOR N/A 08/01/2018   Procedure: TRANSURETHRAL RESECTION OF BLADDER TUMOR (TURBT);  Surgeon: Sondra Come, MD;  Location: [facility] OR[facility];  Service: Urology;  Laterality: N/A;   URETEROSCOPY WITH HOLMIUM LASER LITHOTRIPSY Right 08/01/2018   Procedure: URETEROSCOPY WITH HOLMIUM LASER LITHOTRIPSY;  Surgeon: Sondra Come, MD;  Location: [facility] OR[facility];  Service: Urology;  Laterality: Right;    Family History: Family History  Problem Relation Age of Onset   Psoriasis Mother    Heart failure Father     Social History:   reports that he quit smoking about 16 years ago. His smoking use included cigarettes. He started smoking about 46 years ago. He has a 30 pack-year smoking history. He has been exposed to tobacco smoke. He  has never used smokeless tobacco. He reports that he does not drink alcohol and does not use drugs.  Medications: Medications Prior to Admission  Medication Sig Dispense Refill   aspirin EC (ASPIRIN LOW DOSE) 81 MG tablet TAKE 1 TABLET BY MOUTH EVERY MORNING (SWALLOW WHOLE) (Patient taking differently: Take 81 mg by mouth daily.) 30 tablet 1   atorvastatin (LIPITOR) 80 MG tablet TAKE 1 TABLET BY MOUTH EVERY DAY 90 tablet 2   AURYXIA 1 GM 210 MG(Fe) tablet 210 mg 3 (three) times daily with meals.     calcitRIOL (ROCALTROL) 0.25 MCG capsule Take 0.25 mcg by mouth every Monday, Wednesday, and Friday with hemodialysis.     carvedilol (COREG) 12.5 MG tablet Take 1 tablet (12.5 mg total) by mouth 2 (two) times daily. 180 tablet 1   multivitamin (RENA-VIT) TABS tablet Take 1 tablet by mouth daily.     torsemide (DEMADEX) 100 MG tablet Take 100 mg by mouth every morning.     clopidogrel (PLAVIX) 75 MG tablet TAKE 1 TABLET BY MOUTH EVERY DAY 90 tablet 2   diazepam (VALIUM) 5 MG tablet Take 1 tablet (5 mg total) by mouth every 12 (twelve) hours as needed for anxiety. 30 tablet 0   FLUoxetine (PROZAC) 20 MG capsule Take 1 capsule (  20 mg total) by mouth daily. 90 capsule 3   lidocaine-prilocaine (EMLA) cream Apply 1 Application topically as needed [facility] access).     Methoxy PEG-Epoetin Beta (MIRCERA IJ) Inject 1 each into the skin See admin instructions. During Dialysis     nitroGLYCERIN (NITROSTAT) 0.4 MG SL tablet DISSOLVE 1 TAB UNDER TONGUE FOR CHEST PAIN - IF PAIN REMAINS AFTER 5 MIN, CALL 911 AND REPEAT DOSE. MAX 3 TABS IN 15 MINUTES 25 tablet 3   sildenafil (VIAGRA) 100 MG tablet Take 100 mg by mouth as needed for erectile dysfunction.      Results for orders placed or performed during the hospital encounter of 10/07/23 (from the past 48 hours)  I-STAT, chem 8     Status: Abnormal   Collection Time: 10/07/23  6:16 AM  Result Value Ref Range   Sodium 134 (L) 135 - 145 mmol/L   Potassium 3.8 3.5 -  5.1 mmol/L   Chloride 98 98 - 111 mmol/L   BUN 35 (H) 8 - 23 mg/dL   Creatinine, Ser 4.09 (H) 0.61 - 1.24 mg/dL   Glucose, Bld 811 (H) 70 - 99 mg/dL    Comment: Glucose reference range applies only to samples taken after fasting for at least 8 hours.   Calcium, Ion 1.24 1.15 - 1.40 mmol/L   TCO2 25 22 - 32 mmol/L   Hemoglobin 9.5 (L) 13.0 - 17.0 g/dL   HCT 91.4 (L) 78.2 - 95.6 %  Glucose, capillary     Status: Abnormal   Collection Time: 10/07/23  6:25 AM  Result Value Ref Range   Glucose-Capillary 147 (H) 70 - 99 mg/dL    Comment: Glucose reference range applies only to samples taken after fasting for at least 8 hours.    No results found.    Blood pressure (!) 142/77, pulse 76, temperature 98.2 F (36.8 C), temperature source Oral, resp. rate 20, height 5\' 10"  (1.778 m), weight 103 kg, SpO2 96%.  General appearance: alert, cooperative, and appears stated age Head: Normocephalic, without obvious abnormality, atraumatic Neck: supple, symmetrical, trachea midline Extremities: Intact sensation and capillary refill all digits with altered sensation in fingertips.  +epl/fpl/io.  No wounds.  Skin: Skin color, texture, turgor normal. No rashes or lesions Neurologic: Grossly normal Incision/Wound: none  Assessment/Plan Right carpal tunnel syndrome.  Non operative and operative treatment options have been discussed with the patient and patient wishes to proceed with operative treatment. Risks, benefits, and alternatives of surgery have been discussed and the patient agrees with the plan of care.   Betha Loa 10/07/2023, 7:58 AM

## 2023-10-07 NOTE — Anesthesia Procedure Notes (Signed)
 Procedure Name: LMA Insertion Date/Time: 10/07/2023 8:23 AM  Performed by: Marena Chancy, CRNAPre-anesthesia Checklist: Patient identified, Emergency Drugs available, Suction available and Patient being monitored Patient Re-evaluated:Patient Re-evaluated prior to induction Oxygen Delivery Method: Circle System Utilized Preoxygenation: Pre-oxygenation with 100% oxygen Induction Type: IV induction Ventilation: Mask ventilation without difficulty LMA: LMA inserted LMA Size: 5.0 Number of attempts: 1 Airway Equipment and Method: Bite block Placement Confirmation: positive ETCO2 Tube secured with: Tape Dental Injury: Teeth and Oropharynx as per pre-operative assessment

## 2023-10-07 NOTE — Transfer of Care (Signed)
 Immediate Anesthesia Transfer of Care Note  Patient: Jeremy Dorsey  Procedure(s) Performed: CARPAL TUNNEL RELEASE (Right: Hand)  Patient Location: PACU  Anesthesia Type:General  Level of Consciousness: awake, alert , and oriented  Airway & Oxygen Therapy: Patient Spontanous Breathing and Patient connected to nasal cannula oxygen  Post-op Assessment: Report given to RN, Post -op Vital signs reviewed and stable, and Patient moving all extremities X 4  Post vital signs: Reviewed and stable  Last Vitals:  Vitals Value Taken Time  BP 131/55 10/07/23 0900  Temp    Pulse 76 10/07/23 0902  Resp 18 10/07/23 0902  SpO2 99 % 10/07/23 0902  Vitals shown include unfiled device data.  Last Pain:  Vitals:   10/07/23 0626  TempSrc: Oral  PainSc:          Complications: No notable events documented.

## 2023-10-08 ENCOUNTER — Encounter (HOSPITAL_COMMUNITY): Payer: Self-pay | Admitting: Orthopedic Surgery

## 2023-10-08 DIAGNOSIS — D509 Iron deficiency anemia, unspecified: Secondary | ICD-10-CM | POA: Diagnosis not present

## 2023-10-08 DIAGNOSIS — D631 Anemia in chronic kidney disease: Secondary | ICD-10-CM | POA: Diagnosis not present

## 2023-10-08 DIAGNOSIS — R82998 Other abnormal findings in urine: Secondary | ICD-10-CM | POA: Diagnosis not present

## 2023-10-08 DIAGNOSIS — N2589 Other disorders resulting from impaired renal tubular function: Secondary | ICD-10-CM | POA: Diagnosis not present

## 2023-10-08 DIAGNOSIS — N186 End stage renal disease: Secondary | ICD-10-CM | POA: Diagnosis not present

## 2023-10-08 DIAGNOSIS — I953 Hypotension of hemodialysis: Secondary | ICD-10-CM | POA: Diagnosis not present

## 2023-10-08 DIAGNOSIS — N2581 Secondary hyperparathyroidism of renal origin: Secondary | ICD-10-CM | POA: Diagnosis not present

## 2023-10-08 DIAGNOSIS — Z992 Dependence on renal dialysis: Secondary | ICD-10-CM | POA: Diagnosis not present

## 2023-10-08 DIAGNOSIS — N184 Chronic kidney disease, stage 4 (severe): Secondary | ICD-10-CM | POA: Diagnosis not present

## 2023-10-08 DIAGNOSIS — K769 Liver disease, unspecified: Secondary | ICD-10-CM | POA: Diagnosis not present

## 2023-10-11 DIAGNOSIS — I953 Hypotension of hemodialysis: Secondary | ICD-10-CM | POA: Diagnosis not present

## 2023-10-11 DIAGNOSIS — N186 End stage renal disease: Secondary | ICD-10-CM | POA: Diagnosis not present

## 2023-10-11 DIAGNOSIS — N2581 Secondary hyperparathyroidism of renal origin: Secondary | ICD-10-CM | POA: Diagnosis not present

## 2023-10-11 DIAGNOSIS — N184 Chronic kidney disease, stage 4 (severe): Secondary | ICD-10-CM | POA: Diagnosis not present

## 2023-10-11 DIAGNOSIS — K769 Liver disease, unspecified: Secondary | ICD-10-CM | POA: Diagnosis not present

## 2023-10-11 DIAGNOSIS — R82998 Other abnormal findings in urine: Secondary | ICD-10-CM | POA: Diagnosis not present

## 2023-10-11 DIAGNOSIS — Z992 Dependence on renal dialysis: Secondary | ICD-10-CM | POA: Diagnosis not present

## 2023-10-11 DIAGNOSIS — D631 Anemia in chronic kidney disease: Secondary | ICD-10-CM | POA: Diagnosis not present

## 2023-10-11 DIAGNOSIS — D509 Iron deficiency anemia, unspecified: Secondary | ICD-10-CM | POA: Diagnosis not present

## 2023-10-11 DIAGNOSIS — N2589 Other disorders resulting from impaired renal tubular function: Secondary | ICD-10-CM | POA: Diagnosis not present

## 2023-10-13 DIAGNOSIS — N184 Chronic kidney disease, stage 4 (severe): Secondary | ICD-10-CM | POA: Diagnosis not present

## 2023-10-13 DIAGNOSIS — D509 Iron deficiency anemia, unspecified: Secondary | ICD-10-CM | POA: Diagnosis not present

## 2023-10-13 DIAGNOSIS — N186 End stage renal disease: Secondary | ICD-10-CM | POA: Diagnosis not present

## 2023-10-13 DIAGNOSIS — Z992 Dependence on renal dialysis: Secondary | ICD-10-CM | POA: Diagnosis not present

## 2023-10-13 DIAGNOSIS — D631 Anemia in chronic kidney disease: Secondary | ICD-10-CM | POA: Diagnosis not present

## 2023-10-13 DIAGNOSIS — R82998 Other abnormal findings in urine: Secondary | ICD-10-CM | POA: Diagnosis not present

## 2023-10-13 DIAGNOSIS — K769 Liver disease, unspecified: Secondary | ICD-10-CM | POA: Diagnosis not present

## 2023-10-13 DIAGNOSIS — N2581 Secondary hyperparathyroidism of renal origin: Secondary | ICD-10-CM | POA: Diagnosis not present

## 2023-10-13 DIAGNOSIS — N2589 Other disorders resulting from impaired renal tubular function: Secondary | ICD-10-CM | POA: Diagnosis not present

## 2023-10-13 DIAGNOSIS — I953 Hypotension of hemodialysis: Secondary | ICD-10-CM | POA: Diagnosis not present

## 2023-10-15 DIAGNOSIS — K769 Liver disease, unspecified: Secondary | ICD-10-CM | POA: Diagnosis not present

## 2023-10-15 DIAGNOSIS — D631 Anemia in chronic kidney disease: Secondary | ICD-10-CM | POA: Diagnosis not present

## 2023-10-15 DIAGNOSIS — N186 End stage renal disease: Secondary | ICD-10-CM | POA: Diagnosis not present

## 2023-10-15 DIAGNOSIS — I953 Hypotension of hemodialysis: Secondary | ICD-10-CM | POA: Diagnosis not present

## 2023-10-15 DIAGNOSIS — N184 Chronic kidney disease, stage 4 (severe): Secondary | ICD-10-CM | POA: Diagnosis not present

## 2023-10-15 DIAGNOSIS — D509 Iron deficiency anemia, unspecified: Secondary | ICD-10-CM | POA: Diagnosis not present

## 2023-10-15 DIAGNOSIS — N2589 Other disorders resulting from impaired renal tubular function: Secondary | ICD-10-CM | POA: Diagnosis not present

## 2023-10-15 DIAGNOSIS — Z992 Dependence on renal dialysis: Secondary | ICD-10-CM | POA: Diagnosis not present

## 2023-10-15 DIAGNOSIS — N2581 Secondary hyperparathyroidism of renal origin: Secondary | ICD-10-CM | POA: Diagnosis not present

## 2023-10-15 DIAGNOSIS — R82998 Other abnormal findings in urine: Secondary | ICD-10-CM | POA: Diagnosis not present

## 2023-10-15 DIAGNOSIS — G5601 Carpal tunnel syndrome, right upper limb: Secondary | ICD-10-CM | POA: Diagnosis not present

## 2023-10-18 DIAGNOSIS — N186 End stage renal disease: Secondary | ICD-10-CM | POA: Diagnosis not present

## 2023-10-18 DIAGNOSIS — D631 Anemia in chronic kidney disease: Secondary | ICD-10-CM | POA: Diagnosis not present

## 2023-10-18 DIAGNOSIS — K769 Liver disease, unspecified: Secondary | ICD-10-CM | POA: Diagnosis not present

## 2023-10-18 DIAGNOSIS — Z992 Dependence on renal dialysis: Secondary | ICD-10-CM | POA: Diagnosis not present

## 2023-10-18 DIAGNOSIS — R82998 Other abnormal findings in urine: Secondary | ICD-10-CM | POA: Diagnosis not present

## 2023-10-18 DIAGNOSIS — N184 Chronic kidney disease, stage 4 (severe): Secondary | ICD-10-CM | POA: Diagnosis not present

## 2023-10-18 DIAGNOSIS — N2589 Other disorders resulting from impaired renal tubular function: Secondary | ICD-10-CM | POA: Diagnosis not present

## 2023-10-18 DIAGNOSIS — I953 Hypotension of hemodialysis: Secondary | ICD-10-CM | POA: Diagnosis not present

## 2023-10-18 DIAGNOSIS — N2581 Secondary hyperparathyroidism of renal origin: Secondary | ICD-10-CM | POA: Diagnosis not present

## 2023-10-18 DIAGNOSIS — D509 Iron deficiency anemia, unspecified: Secondary | ICD-10-CM | POA: Diagnosis not present

## 2023-10-19 ENCOUNTER — Ambulatory Visit (HOSPITAL_COMMUNITY)
Admission: RE | Admit: 2023-10-19 | Discharge: 2023-10-19 | Disposition: A | Discharge status: Home / Self Care | Payer: Medicare HMO | Source: Ambulatory Visit | Attending: Vascular Surgery | Admitting: Vascular Surgery

## 2023-10-19 ENCOUNTER — Encounter: Payer: Self-pay | Admitting: Vascular Surgery

## 2023-10-19 ENCOUNTER — Ambulatory Visit (INDEPENDENT_AMBULATORY_CARE_PROVIDER_SITE_OTHER)
Admission: RE | Admit: 2023-10-19 | Discharge: 2023-10-19 | Disposition: A | Discharge status: Home / Self Care | Payer: Medicare HMO | Source: Ambulatory Visit | Attending: Vascular Surgery | Admitting: Vascular Surgery

## 2023-10-19 ENCOUNTER — Ambulatory Visit (INDEPENDENT_AMBULATORY_CARE_PROVIDER_SITE_OTHER): Payer: Medicare HMO | Admitting: Vascular Surgery

## 2023-10-19 VITALS — BP 123/72 | HR 76 | Temp 98.1°F | Resp 20 | Ht 70.0 in | Wt 228.4 lb

## 2023-10-19 DIAGNOSIS — I739 Peripheral vascular disease, unspecified: Secondary | ICD-10-CM | POA: Diagnosis not present

## 2023-10-19 DIAGNOSIS — I70221 Atherosclerosis of native arteries of extremities with rest pain, right leg: Secondary | ICD-10-CM

## 2023-10-19 LAB — VAS US ABI WITH/WO TBI

## 2023-10-19 NOTE — Progress Notes (Signed)
 Patient name: Jeremy Dorsey MRN: [id number] DOB: 1950-08-24 Sex: male  REASON FOR CONSULT: 20-month follow-up for PAD surveillance  HPI: Jeremy Dorsey is a 74 y.o. male, with history of ESRD, hypertension, hyperlipidemia, diabetes, coronary disease, PAD that presents for 64-month follow-up of his PAD.  Previously underwent a right AT DP angioplasty on 01/29/2022 and another right TP trunk peroneal angioplasty on 06/11/2022 for critical limb ischemia.  Reports no lower extremity complaints today.  No tissue loss.  No rest pain.  No new concerns.  States [facility] has been working on his left arm AV fistula.  Past Medical History:  Diagnosis Date   Anemia    Cancer (HCC)    basal cell nose and forehead   Cataract 2016   Cataract surgery bith eyes   CKD (chronic kidney disease), stage IV (HCC)    Coronary artery disease    a. s/p IVUS-guided DESx2 to prox & mid LAD, residual disease treated medically. EF 50-55% by recent echo 02/2020.   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   Urinary complication     Past Surgical History:  Procedure Laterality Date   ABDOMINAL AORTOGRAM W/LOWER EXTREMITY N/A 01/29/2022   Procedure: ABDOMINAL AORTOGRAM W/ Bilateral LOWER EXTREMITY Runoff;  Surgeon: Cephus Shelling, MD;  [facility];  Service: Cardiovascular;  Laterality: N/A;   ABDOMINAL AORTOGRAM W/LOWER EXTREMITY N/A 06/11/2022   Procedure: ABDOMINAL AORTOGRAM W/LOWER EXTREMITY;  Surgeon: Cephus Shelling, MD;  [facility];  Service: Cardiovascular;  Laterality: N/A;   AV FISTULA PLACEMENT Left 10/31/2020   Procedure: LEFT UPPER EXTREMITY ARTERIOVENOUS (AV) FISTULA CREATION;  Surgeon: Maeola Harman, MD;  Location[facility] OR;  Service: Vascular;   Laterality: Left;   AV FISTULA PLACEMENT Left 07/09/2021   Procedure: LEFT ARM ARTERIOVENOUS (AV) FISTULA CREATION;  Surgeon: Nada Libman, MD;  [facility];  Service: Vascular;  Laterality: Left;   CARPAL TUNNEL RELEASE Right 10/07/2023   Procedure: CARPAL TUNNEL RELEASE;  Surgeon: Betha Loa, MD;  [facility];  Service: Orthopedics;  Laterality: Right;   COLONOSCOPY  1990   COLONOSCOPY  06/09/2011   Dr Lemar Livings   CORONARY STENT INTERVENTION N/A 04/24/2020   Procedure: CORONARY STENT INTERVENTION;  Surgeon: Iran Ouch, MD;  [facility];  Service: Cardiovascular;  Laterality: N/A;   CORONARY ULTRASOUND/IVUS N/A 04/24/2020   Procedure: Intravascular Ultrasound/IVUS;  Surgeon: Iran Ouch, MD;  [facility];  Service: Cardiovascular;  Laterality: N/A;   CYSTOSCOPY W/ RETROGRADES Bilateral 08/01/2018   Procedure: CYSTOSCOPY WITH RETROGRADE PYELOGRAM;  Surgeon: Sondra Come, MD;  Location: [facility];  Service: Urology;  Laterality: Bilateral;   CYSTOSCOPY WITH BIOPSY N/A 08/01/2018   Procedure: CYSTOSCOPY WITH Bladder BIOPSY;  Surgeon: Sondra Come, MD;  Location: [facility] OR[facility];  Service: Urology;  Laterality: N/A;   CYSTOSCOPY WITH STENT PLACEMENT Right 08/01/2018   Procedure: CYSTOSCOPY WITH STENT PLACEMENT;  Surgeon: Sondra Come, MD;  Location: [facility];  Service: Urology;  Laterality: Right;   DIALYSIS/PERMA CATHETER INSERTION Left 09/16/2023   Procedure: DIALYSIS/PERMA CATHETER INSERTION;  Surgeon: [name], MD;  Location: [facility] INVASIVE CV LAB;  Service: Cardiovascular;  Laterality: Left;   EYE SURGERY Right    laser surgery   FRACTURE SURGERY     Rt shoulder rotaor cuff   IR THROMBECTOMY AV FISTULA W/THROMBOLYSIS/PTA INC/SHUNT/IMG LEFT Left 06/22/2023   IR US GUIDE VASC ACCESS LEFT  06/22/2023   LEFT HEART CATH AND CORONARY ANGIOGRAPHY N/A 04/24/2020   Procedure: LEFT HEART CATH AND CORONARY ANGIOGRAPHY;  Surgeon:  Iran Ouch, MD;  [facility];  Service: Cardiovascular;  Laterality: N/A;   LEFT HEART CATH AND CORONARY ANGIOGRAPHY Left 04/16/2023   Procedure: LEFT HEART CATH AND CORONARY ANGIOGRAPHY;  Surgeon: Iran Ouch, MD;  Location: [facility] INVASIVE [facility] LAB;  Service: Cardiovascular;  Laterality: Left;   PERIPHERAL VASCULAR BALLOON ANGIOPLASTY Right 01/29/2022   Procedure: PERIPHERAL VASCULAR BALLOON ANGIOPLASTY;  Surgeon: Cephus Shelling, MD;  [facility];  Service: Cardiovascular;  Laterality: Right;   PERIPHERAL VASCULAR BALLOON ANGIOPLASTY Right 06/11/2022   Procedure: PERIPHERAL VASCULAR BALLOON ANGIOPLASTY;  Surgeon: Cephus Shelling, MD;  [facility];  Service: Cardiovascular;  Laterality: Right;  Peroneal   PERIPHERAL VASCULAR BALLOON ANGIOPLASTY  09/15/2023   Procedure: PERIPHERAL VASCULAR BALLOON ANGIOPLASTY;  Surgeon: Dagoberto Ligas, MD;  Location: [facility] INVASIVE CV LAB;  Service: Cardiovascular;;  Outflow/Inflow Cephalic Vein   PERIPHERAL VASCULAR INTERVENTION  09/15/2023   Procedure: PERIPHERAL VASCULAR INTERVENTION;  Surgeon: Dagoberto Ligas, MD;  Location: [facility] INVASIVE CV LAB;  Service: Cardiovascular;;  8x10 Viabahn   PERIPHERAL VASCULAR THROMBECTOMY N/A 09/15/2023   Procedure: PERIPHERAL VASCULAR THROMBECTOMY;  Surgeon: Dagoberto Ligas, MD;  Location: [facility] INVASIVE CV LAB;  Service: Cardiovascular;  Laterality: N/A;   SHOULDER ARTHROSCOPY WITH OPEN ROTATOR CUFF REPAIR Right 08/25/2018   Procedure: SHOULDER ARTHROSCOPY WITH MINI OPEN ROTATOR CUFF REPAIR;  Surgeon: Juanell Fairly, MD;  Location: [facility] OR[facility];  Service: Orthopedics;  Laterality: Right;   TRANSURETHRAL RESECTION OF BLADDER TUMOR N/A 08/01/2018   Procedure: TRANSURETHRAL RESECTION OF BLADDER TUMOR (TURBT);  Surgeon: Sondra Come, MD;  Location: [facility];  Service: Urology;  Laterality: N/A;   URETEROSCOPY WITH HOLMIUM LASER LITHOTRIPSY Right 08/01/2018   Procedure:  URETEROSCOPY WITH HOLMIUM LASER LITHOTRIPSY;  Surgeon: Sondra Come, MD;  Location: [facility] OR[facility];  Service: Urology;  Laterality: Right;    Family History  Problem Relation Age of Onset   Psoriasis Mother    Heart failure Father     SOCIAL HISTORY: Social History   Socioeconomic History   Marital status: Married    Spouse name: Not on file   Number of children: 3   Years of education: Not on file   Highest education level: Bachelor's degree (e.g., BA, AB, BS)  Occupational History    Comment: retired  Tobacco Use   Smoking status: Former    Current packs/day: 0.00    Average packs/day: 1 pack/day for 30.0 years (30.0 ttl pk-yrs)    Types: Cigarettes    Start date: 08/22/1977    Quit date: 08/23/2007    Years since quitting: 16.1    Passive exposure: Past   Smokeless tobacco: Never  Vaping Use   Vaping status: Never Used  Substance and Sexual Activity   Alcohol use: No   Drug use: No   Sexual activity: Not Currently    Birth control/protection: None  Other Topics Concern   Not on file  Social History Narrative   Not on file   Social Drivers of Health   Financial Resource Strain: Patient Declined (03/02/2023)  Overall Financial Resource Strain (CARDIA)    Difficulty of Paying Living Expenses: Patient declined  Food Insecurity: No Food Insecurity (06/21/2023)   Hunger Vital Sign    Worried About Running Out of Food in the Last Year: Never true    Ran Out of Food in the Last Year: Never true  Transportation Needs: No Transportation Needs (06/21/2023)   PRAPARE - Administrator, Civil Service (Medical): No    Lack of Transportation (Non-Medical): No  Physical Activity: Patient Declined (03/02/2023)   Exercise Vital Sign    Days of Exercise per Week: Patient declined    Minutes of Exercise per Session: Patient declined  Stress: Patient Declined (03/02/2023)   [employer] of Occupational Health - Occupational Stress Questionnaire    Feeling of Stress  : Patient declined  Social Connections: Unknown (03/02/2023)   Social Connection and Isolation Panel [NHANES]    Frequency of Communication with Friends and Family: Patient declined    Frequency of Social Gatherings with Friends and Family: Patient declined    Attends Religious Services: Not on Insurance claims handler of Clubs or Organizations: Patient declined    Attends Banker Meetings: Patient declined    Marital Status: Patient declined  Intimate Partner Violence: Not At Risk (06/21/2023)   Humiliation, Afraid, Rape, and Kick questionnaire    Fear of Current or Ex-Partner: No    Emotionally Abused: No    Physically Abused: No    Sexually Abused: No    Allergies  Allergen Reactions   Oxycodone Nausea And Vomiting   Hydrocodone Nausea And Vomiting    Current Outpatient Medications  Medication Sig Dispense Refill   aspirin EC (ASPIRIN LOW DOSE) 81 MG tablet TAKE 1 TABLET BY MOUTH EVERY MORNING (SWALLOW WHOLE) (Patient taking differently: Take 81 mg by mouth daily.) 30 tablet 1   atorvastatin (LIPITOR) 80 MG tablet TAKE 1 TABLET BY MOUTH EVERY DAY 90 tablet 2   AURYXIA 1 GM 210 MG(Fe) tablet 210 mg 3 (three) times daily with meals.     calcitRIOL (ROCALTROL) 0.25 MCG capsule Take 0.25 mcg by mouth every Monday, Wednesday, and Friday with hemodialysis.     carvedilol (COREG) 12.5 MG tablet Take 1 tablet (12.5 mg total) by mouth 2 (two) times daily. 180 tablet 1   clopidogrel (PLAVIX) 75 MG tablet TAKE 1 TABLET BY MOUTH EVERY DAY 90 tablet 2   diazepam (VALIUM) 5 MG tablet Take 1 tablet (5 mg total) by mouth every 12 (twelve) hours as needed for anxiety. 30 tablet 0   FLUoxetine (PROZAC) 20 MG capsule Take 1 capsule (20 mg total) by mouth daily. 90 capsule 3   lidocaine-prilocaine (EMLA) cream Apply 1 Application topically as needed [facility] access).     Methoxy PEG-Epoetin Beta (MIRCERA IJ) Inject 1 each into the skin See admin instructions. During Dialysis     multivitamin  (RENA-VIT) TABS tablet Take 1 tablet by mouth daily.     nitroGLYCERIN (NITROSTAT) 0.4 MG SL tablet DISSOLVE 1 TAB UNDER TONGUE FOR CHEST PAIN - IF PAIN REMAINS AFTER 5 MIN, CALL 911 AND REPEAT DOSE. MAX 3 TABS IN 15 MINUTES 25 tablet 3   sildenafil (VIAGRA) 100 MG tablet Take 100 mg by mouth as needed for erectile dysfunction.     torsemide (DEMADEX) 100 MG tablet Take 100 mg by mouth every morning.     traMADol (ULTRAM) 50 MG tablet 1-2 tabs PO q6 hours prn pain 20 tablet 0  No current facility-administered medications for this visit.    REVIEW OF SYSTEMS:  [X]  denotes positive finding, [ ]  denotes negative finding Cardiac  Comments:  Chest pain or chest pressure:    Shortness of breath upon exertion:    Short of breath when lying flat:    Irregular heart rhythm:        Vascular    Pain in calf, thigh, or hip brought on by ambulation:    Pain in feet at night that wakes you up from your sleep:     Blood clot in your veins:    Leg swelling:         Pulmonary    Oxygen at home:    Productive cough:     Wheezing:         Neurologic    Sudden weakness in arms or legs:     Sudden numbness in arms or legs:     Sudden onset of difficulty speaking or slurred speech:    Temporary loss of vision in one eye:     Problems with dizziness:         Gastrointestinal    Blood in stool:     Vomited blood:         Genitourinary    Burning when urinating:     Blood in urine:        Psychiatric    Major depression:         Hematologic    Bleeding problems:    Problems with blood clotting too easily:        Skin    Rashes or ulcers:        Constitutional    Fever or chills:      PHYSICAL EXAM: Vitals:   10/19/23 1345  BP: 123/72  Pulse: 76  Resp: 20  Temp: 98.1 F (36.7 C)  TempSrc: Temporal  SpO2: 97%  Weight: 228 lb 6.4 oz (103.6 kg)  Height: 5\' 10"  (1.778 m)    GENERAL: The patient is a well-nourished male, in no acute distress. The vital signs are documented  above. CARDIAC: There is a regular rate and rhythm.  VASCULAR:  Left brachiocephalic AV fistula with thrill Palpable femoral pulses bilaterally Right DP PT brisk by Doppler and biphasic No lower extremity tissue loss PULMONARY: No respiratory distress. ABDOMEN: Soft and non-tender. MUSCULOSKELETAL: There are no major deformities or cyanosis. NEUROLOGIC: No focal weakness or paresthesias are detected. SKIN: There are no ulcers or rashes noted. PSYCHIATRIC: The patient has a normal affect.  DATA:   Lower extremity arterial duplex today shows a moderate 50 to 70% right popliteal stenosis velocity 233  ABIs today are noncompressible with biphasic waveforms bilaterally  Assessment/Plan:  73 y.o. male, with history of ESRD, hypertension, hyperlipidemia, diabetes, coronary disease, PAD that presents for 35-month follow-up of his PAD.  Previously underwent a right AT DP angioplasty on 01/29/2022 and another right TP trunk peroneal angioplasty on 06/11/2022 for critical limb ischemia.  Today he has no lower extremity complaints.  Rest pain is all resolved.  Lower extremity arterial duplex shows no recurrent high-grade stenosis.  There is a moderate popliteal stenosis that we can watch.  I will arrange follow-up in 1 year with repeat lower extremity arterial duplex and ABIs.  I discussed if he develops any recurrent symptoms like he had in the past to let me know.   Cephus Shelling, MD [facility] Office: [phone]

## 2023-10-20 DIAGNOSIS — K769 Liver disease, unspecified: Secondary | ICD-10-CM | POA: Diagnosis not present

## 2023-10-20 DIAGNOSIS — Z992 Dependence on renal dialysis: Secondary | ICD-10-CM | POA: Diagnosis not present

## 2023-10-20 DIAGNOSIS — N186 End stage renal disease: Secondary | ICD-10-CM | POA: Diagnosis not present

## 2023-10-20 DIAGNOSIS — D509 Iron deficiency anemia, unspecified: Secondary | ICD-10-CM | POA: Diagnosis not present

## 2023-10-20 DIAGNOSIS — I953 Hypotension of hemodialysis: Secondary | ICD-10-CM | POA: Diagnosis not present

## 2023-10-20 DIAGNOSIS — N2581 Secondary hyperparathyroidism of renal origin: Secondary | ICD-10-CM | POA: Diagnosis not present

## 2023-10-20 DIAGNOSIS — R82998 Other abnormal findings in urine: Secondary | ICD-10-CM | POA: Diagnosis not present

## 2023-10-20 DIAGNOSIS — D631 Anemia in chronic kidney disease: Secondary | ICD-10-CM | POA: Diagnosis not present

## 2023-10-20 DIAGNOSIS — N184 Chronic kidney disease, stage 4 (severe): Secondary | ICD-10-CM | POA: Diagnosis not present

## 2023-10-20 DIAGNOSIS — N2589 Other disorders resulting from impaired renal tubular function: Secondary | ICD-10-CM | POA: Diagnosis not present

## 2023-10-22 DIAGNOSIS — N184 Chronic kidney disease, stage 4 (severe): Secondary | ICD-10-CM | POA: Diagnosis not present

## 2023-10-22 DIAGNOSIS — N2589 Other disorders resulting from impaired renal tubular function: Secondary | ICD-10-CM | POA: Diagnosis not present

## 2023-10-22 DIAGNOSIS — K769 Liver disease, unspecified: Secondary | ICD-10-CM | POA: Diagnosis not present

## 2023-10-22 DIAGNOSIS — G5601 Carpal tunnel syndrome, right upper limb: Secondary | ICD-10-CM | POA: Diagnosis not present

## 2023-10-22 DIAGNOSIS — D509 Iron deficiency anemia, unspecified: Secondary | ICD-10-CM | POA: Diagnosis not present

## 2023-10-22 DIAGNOSIS — R82998 Other abnormal findings in urine: Secondary | ICD-10-CM | POA: Diagnosis not present

## 2023-10-22 DIAGNOSIS — N2581 Secondary hyperparathyroidism of renal origin: Secondary | ICD-10-CM | POA: Diagnosis not present

## 2023-10-22 DIAGNOSIS — Z992 Dependence on renal dialysis: Secondary | ICD-10-CM | POA: Diagnosis not present

## 2023-10-22 DIAGNOSIS — D631 Anemia in chronic kidney disease: Secondary | ICD-10-CM | POA: Diagnosis not present

## 2023-10-22 DIAGNOSIS — I953 Hypotension of hemodialysis: Secondary | ICD-10-CM | POA: Diagnosis not present

## 2023-10-22 DIAGNOSIS — N186 End stage renal disease: Secondary | ICD-10-CM | POA: Diagnosis not present

## 2023-10-25 DIAGNOSIS — K769 Liver disease, unspecified: Secondary | ICD-10-CM | POA: Diagnosis not present

## 2023-10-25 DIAGNOSIS — N2589 Other disorders resulting from impaired renal tubular function: Secondary | ICD-10-CM | POA: Diagnosis not present

## 2023-10-25 DIAGNOSIS — Z992 Dependence on renal dialysis: Secondary | ICD-10-CM | POA: Diagnosis not present

## 2023-10-25 DIAGNOSIS — D631 Anemia in chronic kidney disease: Secondary | ICD-10-CM | POA: Diagnosis not present

## 2023-10-25 DIAGNOSIS — I953 Hypotension of hemodialysis: Secondary | ICD-10-CM | POA: Diagnosis not present

## 2023-10-25 DIAGNOSIS — D509 Iron deficiency anemia, unspecified: Secondary | ICD-10-CM | POA: Diagnosis not present

## 2023-10-25 DIAGNOSIS — N186 End stage renal disease: Secondary | ICD-10-CM | POA: Diagnosis not present

## 2023-10-25 DIAGNOSIS — N184 Chronic kidney disease, stage 4 (severe): Secondary | ICD-10-CM | POA: Diagnosis not present

## 2023-10-25 DIAGNOSIS — N2581 Secondary hyperparathyroidism of renal origin: Secondary | ICD-10-CM | POA: Diagnosis not present

## 2023-10-25 DIAGNOSIS — R82998 Other abnormal findings in urine: Secondary | ICD-10-CM | POA: Diagnosis not present

## 2023-10-27 DIAGNOSIS — N184 Chronic kidney disease, stage 4 (severe): Secondary | ICD-10-CM | POA: Diagnosis not present

## 2023-10-27 DIAGNOSIS — N2581 Secondary hyperparathyroidism of renal origin: Secondary | ICD-10-CM | POA: Diagnosis not present

## 2023-10-27 DIAGNOSIS — I953 Hypotension of hemodialysis: Secondary | ICD-10-CM | POA: Diagnosis not present

## 2023-10-27 DIAGNOSIS — K769 Liver disease, unspecified: Secondary | ICD-10-CM | POA: Diagnosis not present

## 2023-10-27 DIAGNOSIS — N186 End stage renal disease: Secondary | ICD-10-CM | POA: Diagnosis not present

## 2023-10-27 DIAGNOSIS — N2589 Other disorders resulting from impaired renal tubular function: Secondary | ICD-10-CM | POA: Diagnosis not present

## 2023-10-27 DIAGNOSIS — D631 Anemia in chronic kidney disease: Secondary | ICD-10-CM | POA: Diagnosis not present

## 2023-10-27 DIAGNOSIS — Z992 Dependence on renal dialysis: Secondary | ICD-10-CM | POA: Diagnosis not present

## 2023-10-27 DIAGNOSIS — R82998 Other abnormal findings in urine: Secondary | ICD-10-CM | POA: Diagnosis not present

## 2023-10-27 DIAGNOSIS — D509 Iron deficiency anemia, unspecified: Secondary | ICD-10-CM | POA: Diagnosis not present

## 2023-10-29 DIAGNOSIS — R82998 Other abnormal findings in urine: Secondary | ICD-10-CM | POA: Diagnosis not present

## 2023-10-29 DIAGNOSIS — N2581 Secondary hyperparathyroidism of renal origin: Secondary | ICD-10-CM | POA: Diagnosis not present

## 2023-10-29 DIAGNOSIS — N2589 Other disorders resulting from impaired renal tubular function: Secondary | ICD-10-CM | POA: Diagnosis not present

## 2023-10-29 DIAGNOSIS — D631 Anemia in chronic kidney disease: Secondary | ICD-10-CM | POA: Diagnosis not present

## 2023-10-29 DIAGNOSIS — D509 Iron deficiency anemia, unspecified: Secondary | ICD-10-CM | POA: Diagnosis not present

## 2023-10-29 DIAGNOSIS — K769 Liver disease, unspecified: Secondary | ICD-10-CM | POA: Diagnosis not present

## 2023-10-29 DIAGNOSIS — I953 Hypotension of hemodialysis: Secondary | ICD-10-CM | POA: Diagnosis not present

## 2023-10-29 DIAGNOSIS — N186 End stage renal disease: Secondary | ICD-10-CM | POA: Diagnosis not present

## 2023-10-29 DIAGNOSIS — Z992 Dependence on renal dialysis: Secondary | ICD-10-CM | POA: Diagnosis not present

## 2023-10-29 DIAGNOSIS — N184 Chronic kidney disease, stage 4 (severe): Secondary | ICD-10-CM | POA: Diagnosis not present

## 2023-11-01 DIAGNOSIS — K769 Liver disease, unspecified: Secondary | ICD-10-CM | POA: Diagnosis not present

## 2023-11-01 DIAGNOSIS — N2589 Other disorders resulting from impaired renal tubular function: Secondary | ICD-10-CM | POA: Diagnosis not present

## 2023-11-01 DIAGNOSIS — D509 Iron deficiency anemia, unspecified: Secondary | ICD-10-CM | POA: Diagnosis not present

## 2023-11-01 DIAGNOSIS — D631 Anemia in chronic kidney disease: Secondary | ICD-10-CM | POA: Diagnosis not present

## 2023-11-01 DIAGNOSIS — R82998 Other abnormal findings in urine: Secondary | ICD-10-CM | POA: Diagnosis not present

## 2023-11-01 DIAGNOSIS — N186 End stage renal disease: Secondary | ICD-10-CM | POA: Diagnosis not present

## 2023-11-01 DIAGNOSIS — Z992 Dependence on renal dialysis: Secondary | ICD-10-CM | POA: Diagnosis not present

## 2023-11-01 DIAGNOSIS — N2581 Secondary hyperparathyroidism of renal origin: Secondary | ICD-10-CM | POA: Diagnosis not present

## 2023-11-01 DIAGNOSIS — I129 Hypertensive chronic kidney disease with stage 1 through stage 4 chronic kidney disease, or unspecified chronic kidney disease: Secondary | ICD-10-CM | POA: Diagnosis not present

## 2023-11-01 DIAGNOSIS — N184 Chronic kidney disease, stage 4 (severe): Secondary | ICD-10-CM | POA: Diagnosis not present

## 2023-11-01 DIAGNOSIS — I953 Hypotension of hemodialysis: Secondary | ICD-10-CM | POA: Diagnosis not present

## 2023-11-03 DIAGNOSIS — E1122 Type 2 diabetes mellitus with diabetic chronic kidney disease: Secondary | ICD-10-CM | POA: Diagnosis not present

## 2023-11-03 DIAGNOSIS — R17 Unspecified jaundice: Secondary | ICD-10-CM | POA: Diagnosis not present

## 2023-11-03 DIAGNOSIS — D509 Iron deficiency anemia, unspecified: Secondary | ICD-10-CM | POA: Diagnosis not present

## 2023-11-03 DIAGNOSIS — Z79899 Other long term (current) drug therapy: Secondary | ICD-10-CM | POA: Diagnosis not present

## 2023-11-03 DIAGNOSIS — N184 Chronic kidney disease, stage 4 (severe): Secondary | ICD-10-CM | POA: Diagnosis not present

## 2023-11-03 DIAGNOSIS — R82998 Other abnormal findings in urine: Secondary | ICD-10-CM | POA: Diagnosis not present

## 2023-11-03 DIAGNOSIS — E440 Moderate protein-calorie malnutrition: Secondary | ICD-10-CM | POA: Diagnosis not present

## 2023-11-03 DIAGNOSIS — N186 End stage renal disease: Secondary | ICD-10-CM | POA: Diagnosis not present

## 2023-11-03 DIAGNOSIS — Z992 Dependence on renal dialysis: Secondary | ICD-10-CM | POA: Diagnosis not present

## 2023-11-03 DIAGNOSIS — D631 Anemia in chronic kidney disease: Secondary | ICD-10-CM | POA: Diagnosis not present

## 2023-11-03 DIAGNOSIS — N2581 Secondary hyperparathyroidism of renal origin: Secondary | ICD-10-CM | POA: Diagnosis not present

## 2023-11-04 ENCOUNTER — Other Ambulatory Visit: Payer: Self-pay

## 2023-11-04 ENCOUNTER — Encounter (HOSPITAL_COMMUNITY)
Admission: RE | Disposition: A | Discharge status: Home / Self Care | Payer: Self-pay | Source: Home / Self Care | Attending: Nephrology

## 2023-11-04 ENCOUNTER — Encounter (HOSPITAL_COMMUNITY): Payer: Self-pay | Admitting: Nephrology

## 2023-11-04 ENCOUNTER — Ambulatory Visit (HOSPITAL_COMMUNITY)
Admission: RE | Admit: 2023-11-04 | Discharge: 2023-11-04 | Disposition: A | Discharge status: Home / Self Care | Attending: Nephrology | Admitting: Nephrology

## 2023-11-04 DIAGNOSIS — Z992 Dependence on renal dialysis: Secondary | ICD-10-CM | POA: Diagnosis not present

## 2023-11-04 DIAGNOSIS — N250 Renal osteodystrophy: Secondary | ICD-10-CM | POA: Diagnosis not present

## 2023-11-04 DIAGNOSIS — M7989 Other specified soft tissue disorders: Secondary | ICD-10-CM | POA: Insufficient documentation

## 2023-11-04 DIAGNOSIS — Z8673 Personal history of transient ischemic attack (TIA), and cerebral infarction without residual deficits: Secondary | ICD-10-CM | POA: Diagnosis not present

## 2023-11-04 DIAGNOSIS — E1151 Type 2 diabetes mellitus with diabetic peripheral angiopathy without gangrene: Secondary | ICD-10-CM | POA: Diagnosis not present

## 2023-11-04 DIAGNOSIS — I2510 Atherosclerotic heart disease of native coronary artery without angina pectoris: Secondary | ICD-10-CM | POA: Diagnosis not present

## 2023-11-04 DIAGNOSIS — T8249XA Other complication of vascular dialysis catheter, initial encounter: Secondary | ICD-10-CM | POA: Insufficient documentation

## 2023-11-04 DIAGNOSIS — E1122 Type 2 diabetes mellitus with diabetic chronic kidney disease: Secondary | ICD-10-CM | POA: Diagnosis not present

## 2023-11-04 DIAGNOSIS — Y839 Surgical procedure, unspecified as the cause of abnormal reaction of the patient, or of later complication, without mention of misadventure at the time of the procedure: Secondary | ICD-10-CM | POA: Insufficient documentation

## 2023-11-04 DIAGNOSIS — Z79899 Other long term (current) drug therapy: Secondary | ICD-10-CM | POA: Diagnosis not present

## 2023-11-04 DIAGNOSIS — Z87891 Personal history of nicotine dependence: Secondary | ICD-10-CM | POA: Diagnosis not present

## 2023-11-04 DIAGNOSIS — N186 End stage renal disease: Secondary | ICD-10-CM | POA: Insufficient documentation

## 2023-11-04 DIAGNOSIS — I120 Hypertensive chronic kidney disease with stage 5 chronic kidney disease or end stage renal disease: Secondary | ICD-10-CM | POA: Insufficient documentation

## 2023-11-04 DIAGNOSIS — D631 Anemia in chronic kidney disease: Secondary | ICD-10-CM | POA: Insufficient documentation

## 2023-11-04 SURGERY — DIALYSIS/PERMA CATHETER INSERTION
Anesthesia: LOCAL | Laterality: Right

## 2023-11-04 MED ORDER — HEPARIN SODIUM (PORCINE) 1000 UNIT/ML IJ SOLN
INTRAMUSCULAR | Status: DC | PRN
  Administered 2023-11-04: 3800 [IU] via INTRAVENOUS

## 2023-11-04 MED ORDER — HEPARIN SODIUM (PORCINE) 1000 UNIT/ML IJ SOLN
INTRAMUSCULAR | Status: AC
  Filled 2023-11-04: qty 10

## 2023-11-04 MED ORDER — LIDOCAINE HCL (PF) 1 % IJ SOLN
INTRAMUSCULAR | Status: DC | PRN
  Administered 2023-11-04: 10 mL via SUBCUTANEOUS
  Administered 2023-11-04: 5 mL via SUBCUTANEOUS

## 2023-11-04 MED ORDER — HEPARIN (PORCINE) IN NACL 1000-0.9 UT/500ML-% IV SOLN
INTRAVENOUS | Status: DC | PRN
  Administered 2023-11-04: 500 mL

## 2023-11-04 MED ORDER — LIDOCAINE HCL (PF) 1 % IJ SOLN
INTRAMUSCULAR | Status: AC
  Filled 2023-11-04: qty 30

## 2023-11-04 MED ORDER — HYDROMORPHONE HCL 1 MG/ML IJ SOLN
INTRAMUSCULAR | Status: DC | PRN
Start: 2023-11-04 — End: 2023-11-04
  Administered 2023-11-04: 1 mg via INTRAVENOUS

## 2023-11-04 MED ORDER — MIDAZOLAM HCL 2 MG/2ML IJ SOLN
INTRAMUSCULAR | Status: DC | PRN
Start: 2023-11-04 — End: 2023-11-04
  Administered 2023-11-04: 2 mg via INTRAVENOUS

## 2023-11-04 MED ORDER — HYDROMORPHONE HCL 1 MG/ML IJ SOLN
INTRAMUSCULAR | Status: AC
  Filled 2023-11-04: qty 0.5

## 2023-11-04 MED ORDER — MIDAZOLAM HCL 2 MG/2ML IJ SOLN
INTRAMUSCULAR | Status: AC
  Filled 2023-11-04: qty 2

## 2023-11-04 SURGICAL SUPPLY — 7 items
BAG SNAP BAND KOVER 36X36 (MISCELLANEOUS) ×3 IMPLANT
CATH PALINDROME-P 23 W/VT (CATHETERS) ×1 IMPLANT
COVER DOME SNAP 22 D (MISCELLANEOUS) ×3 IMPLANT
GLIDEWIRE STIFF .35X180X3 HYDR (WIRE) ×1 IMPLANT
KIT MICROPUNCTURE NIT STIFF (SHEATH) ×1 IMPLANT
SHEATH PROBE COVER 6X72 (BAG) ×3 IMPLANT
TRAY PV CATH (CUSTOM PROCEDURE TRAY) ×3 IMPLANT

## 2023-11-04 NOTE — Op Note (Signed)
 Patient presents for tunneled catheter insertion for dialysis access given left arm swelling since the left IJ catheter was placed September 16, 2023.; ultrasound shows a good right internal jugular vein. The decision was made to place a Rt internal jugular tunneled dialysis catheter.  Summary:  1) Successful placement of a new 23 cm cuff to tip hemodialysis catheter (Palindrome) in the right internal jugular vein with the tip in the right atrium.  2) Recommendations to the dialysis unit TO NOT USE HEPARIN FOR THE FIRST 24 HOURS. 3) Removal of the left IJ tunnel catheter; left arm swelling does not improve within a week please refer him back for a angiogram and likely central venous angioplasty.  Description of procedure:  Procedure #1 (right IJ tunnel catheter placement)  The right neck and chest area were prepped and draped in the usual fashion.  Local anesthesia was provided by injecting lidocaine 1% with epinephrine at the groin site overlying the desired venotomy site. Using real time ultrasound guidance I was able to cannulate the right jugular vein and the wire easily advanced into the central circulation. The wire was advanced and parked in the IVC.  I then blunt dissected the tissue around the 5 Fr stylet till it was freely mobile. Then, after injecting local anesthesia into the desired track and exit site  I made a small incision at the exit site and tunneled a 23 cm cuff to tip Palindrome dialysis catheter, pulling it out at the venotomy site. Sequential dilation with 12, 14, and finally the peelaway sheath were done with real time fluoroscopic guidance ensuring that we were straight lined with the wire at all times. Then, I inserted the catheter over the wire and through the sheath. After removing the peelaway sheath I adjusted the catheter until the tip of the catheter was positioned in the right atrium of the heart. The cuff of the catheter was positioned in the subcutaneous tunnel with  the cuff approximately 2 cm from the exit site.   Both limbs of the catheter were aspirated and flushed with excellent flow noted. Both limbs of the catheter were locked with citrate and sterile caps placed. The hub of the catheter was sutured to the thigh with 2-0 nylon suture.   The venotomy incision was closed with a vertical mattress 3-0 Monocryl suture.  Procedure #2 ([facility] tunneled catheter removal)  The  left neck + chest  area and the catheter were prepped and draped in the usual sterile fashion. The exit site and adjacent tunnel tract were anesthetized with lidocaine 1% with epinephrine. The cuff was free by blunt dissection. The catheter was then retracted, inspected, and noted to be intact with cuff removed as well. Hemostasis was obtained via manual pressure. Sterile dressings were placed and the patient returned to recovery in stable condition.  Sedation: 1 mg Versed, 1 mcg Dilaudid Sedation time: 19 minutes  Contrast. 0 mL  Monitoring: Because of the patient's comorbid conditions and sedation during the procedure, continuous EKG monitoring and O2 saturation monitoring was performed throughout the procedure by the RN. There were no abnormal arrhythmias encountered.  Complications: None.  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 [phone] catheter removal left IJ  Recommendations: Remove the suture in 2-3 weeks. 2.   Report any blood flow problems to [facility]. 3.  If left arm swelling does  not improve within 7 days please refer back for a fistulogram and likely central venous angioplasty.  Discharge: The patient was discharged home in stable condition. The patient was given education regarding the care of the catheter and specific instructions in case of any problems.

## 2023-11-04 NOTE — Discharge Instructions (Signed)

## 2023-11-04 NOTE — H&P (Addendum)
 Chief Complaint: Decreased flows  Interval H&P   The patient has presented today for tunneled catheter insertion to initiate dialysis.  Various methods of treatment have been discussed with the patient.  After consideration of risk, benefits and other options for treatment, the patient has consented to a tunneled catheter insertion.   Risks  of bleeding, pain, infection, nonhealing wound, lung and carotid artery injury were explained to the patient.  The patient's history has been reviewed and the patient has been examined, no changes in status.  Stable for tunneled catheter insertion.  I have reviewed the patient's chart and labs.  Questions were answered to the patient's satisfaction.  Assessment/Plan: ESRD dialyzing at home. Hand swelling ipsilateral to the brachiocephalic fistula - planning on catheter insertion on the right side, removal from the left side.  Hand and arm swelling likely secondary to the catheter.  Because of difficult cannulation I would also do angiogram of the fistula but he prefers to do that another day.  We will wait to see if the left arm swelling gets better after removing the tunneled catheter; if it does not we can do an angiogram of the fistula.   Renal osteodystrophy - continue binders per home regimen. Anemia - managed with ESA's and IV iron at dialysis center. HTN - resume home regimen.   HPI: Jeremy Dorsey is an 73 y.o. male with history of CVA, coronary sclerotic heart disease, hypertension, hyperlipidemia, diabetes, ESRD dialyzing at home.  Here for left hand/arm swelling.  ROS Per HPI.  Chemistry and CBC: Creatinine  Date/Time Value Ref Range Status  12/12/2020 12:00 AM 5.4 (A) 0.6 - 1.3 Final   Creatinine, Ser  Date/Time Value Ref Range Status  10/07/2023 06:16 AM 6.30 (H) 0.61 - 1.24 mg/dL Final  62/13/0865 78:46 AM 12.16 (H) 0.61 - 1.24 mg/dL Final  96/29/5284 13:24 PM 12.17 (H) 0.61 - 1.24 mg/dL Final  40/05/2724 36:64 PM 12.80 (H)  0.61 - 1.24 mg/dL Final  40/34/7425 95:63 PM 12.23 (H) 0.61 - 1.24 mg/dL Final  87/56/4332 95:18 PM 8.36 (H) 0.76 - 1.27 mg/dL Final  84/16/6063 01:60 AM 8.69 (H) 0.76 - 1.27 mg/dL Final  10/93/2355 73:22 AM 8.07 (H) 0.61 - 1.24 mg/dL Final  02/54/2706 23:76 PM 6.33 (H) 0.61 - 1.24 mg/dL Final  28/31/5176 16:07 AM 6.50 (H) 0.61 - 1.24 mg/dL Final  37/05/6268 48:54 AM 6.10 (H) 0.61 - 1.24 mg/dL Final  62/70/3500 93:81 AM 7.60 (H) 0.61 - 1.24 mg/dL Final  82/99/3716 96:78 AM 7.00 (H) 0.61 - 1.24 mg/dL Final  93/81/0175 10:25 AM 5.35 (H) 0.61 - 1.24 mg/dL Final  85/27/7824 23:53 AM 5.01 (H) 0.61 - 1.24 mg/dL Final  61/44/3154 00:86 AM 5.41 (H) 0.61 - 1.24 mg/dL Final  76/19/5093 26:71 AM 4.76 (H) 0.61 - 1.24 mg/dL Final  24/58/0998 33:82 AM 4.90 (H) 0.61 - 1.24 mg/dL Final  50/53/9767 34:19 AM 4.43 (H) 0.61 - 1.24 mg/dL Final  37/90/2409 73:53 PM 4.72 (H) 0.61 - 1.24 mg/dL Final  29/92/4268 34:19 AM 3.84 (H) 0.61 - 1.24 mg/dL Final  62/22/9798 92:11 AM 4.81 (H) 0.61 - 1.24 mg/dL Final  94/17/4081 44:81 AM 4.04 (H) 0.61 - 1.24 mg/dL Final  85/63/1497 02:63 PM 5.62 (H) 0.61 - 1.24 mg/dL Final  78/58/8502 77:41 AM 5.69 (H) 0.61 - 1.24 mg/dL Final  28/78/6767 20:94 AM 4.49 (H) 0.76 - 1.27 mg/dL Final   No results for input(s): "NA", "K", "CL", "CO2", "GLUCOSE", "BUN", "CREATININE", "CALCIUM", "PHOS" in the last 168 hours.  Invalid input(s): "ALB" No results for input(s): "WBC", "NEUTROABS", "HGB", "HCT", "MCV", "PLT" in the last 168 hours. Liver Function Tests: No results for input(s): "AST", "ALT", "ALKPHOS", "BILITOT", "PROT", "ALBUMIN" in the last 168 hours. No results for input(s): "LIPASE", "AMYLASE" in the last 168 hours. No results for input(s): "AMMONIA" in the last 168 hours. Cardiac Enzymes: No results for input(s): "CKTOTAL", "CKMB", "CKMBINDEX", "TROPONINI" in the last 168 hours. Iron Studies: No results for input(s): "IRON", "TIBC", "TRANSFERRIN", "FERRITIN" in the last 72  hours. PT/INR: @LABRCNTIP (inr:5)  Xrays/Other Studies: )No results found for this or any previous visit (from the past 48 hours). No results found.  PMH:   Past Medical History:  Diagnosis Date   Anemia    Cancer (HCC)    basal cell nose and forehead   Cataract 2016   Cataract surgery bith eyes   CKD (chronic kidney disease), stage IV (HCC)    Coronary artery disease    a. s/p IVUS-guided DESx2 to prox & mid LAD, residual disease treated medically. EF 50-55% by recent echo 02/2020.   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   Urinary complication     PSH:   Past Surgical History:  Procedure Laterality Date   ABDOMINAL AORTOGRAM W/LOWER EXTREMITY N/A 01/29/2022   Procedure: ABDOMINAL AORTOGRAM W/ Bilateral LOWER EXTREMITY [place];  Surgeon: Cephus Shelling, MD;  Location: [facility];  Service: Cardiovascular;  Laterality: N/A;   ABDOMINAL AORTOGRAM W/LOWER EXTREMITY N/A 06/11/2022   Procedure: ABDOMINAL AORTOGRAM W/LOWER EXTREMITY;  Surgeon: Cephus Shelling, MD;  Location: [facility];  Service: Cardiovascular;  Laterality: N/A;   AV FISTULA PLACEMENT Left 10/31/2020   Procedure: LEFT UPPER EXTREMITY ARTERIOVENOUS (AV) FISTULA CREATION;  Surgeon: Maeola Harman, MD;  Location: [facility] OR;  Service: Vascular;  Laterality: Left;   AV FISTULA PLACEMENT Left 07/09/2021   Procedure: LEFT ARM ARTERIOVENOUS (AV) FISTULA CREATION;  Surgeon: Nada Libman, MD;  Location: [facility] OR;  Service: Vascular;  Laterality: Left;   CARPAL TUNNEL RELEASE Right 10/07/2023   Procedure: CARPAL TUNNEL RELEASE;  Surgeon: Betha Loa, MD;  Location: [facility] OR;  Service: Orthopedics;  Laterality: Right;   COLONOSCOPY  1990   COLONOSCOPY  06/09/2011   Dr Lemar Livings   CORONARY STENT  INTERVENTION N/A 04/24/2020   Procedure: CORONARY STENT INTERVENTION;  Surgeon: Iran Ouch, MD;  Location: [facility];  Service: Cardiovascular;  Laterality: N/A;   CORONARY ULTRASOUND/IVUS N/A 04/24/2020   Procedure: Intravascular Ultrasound/IVUS;  Surgeon: Iran Ouch, MD;  [facility]: [facility];  Service: Cardiovascular;  Laterality: N/A;   CYSTOSCOPY W/ RETROGRADES Bilateral 08/01/2018   Procedure: CYSTOSCOPY WITH RETROGRADE PYELOGRAM;  Surgeon: Sondra Come, MD;  Location: [facility] ORS;  Service: Urology;  Laterality: Bilateral;   CYSTOSCOPY WITH BIOPSY N/A 08/01/2018   Procedure: CYSTOSCOPY WITH Bladder BIOPSY;  Surgeon: Sondra Come, MD;  Location: [facility];  Service: Urology;  Laterality: N/A;   CYSTOSCOPY WITH STENT PLACEMENT Right 08/01/2018   Procedure: CYSTOSCOPY WITH STENT PLACEMENT;  Surgeon: Sondra Come, MD;  Location: [facility];  Service: Urology;  Laterality: Right;   DIALYSIS/PERMA CATHETER INSERTION Left 09/16/2023   Procedure: DIALYSIS/PERMA CATHETER INSERTION;  Surgeon: Estill Bakes  A, MD;  [facility];  Service: Cardiovascular;  Laterality: Left;   EYE SURGERY Right    laser surgery   FRACTURE SURGERY     Rt shoulder rotaor cuff   IR THROMBECTOMY AV FISTULA W/THROMBOLYSIS/PTA INC/SHUNT/IMG LEFT Left 06/22/2023   IR US GUIDE VASC ACCESS LEFT  06/22/2023   LEFT HEART CATH AND CORONARY ANGIOGRAPHY N/A 04/24/2020   Procedure: LEFT HEART CATH AND CORONARY ANGIOGRAPHY;  Surgeon: Iran Ouch, MD;  [facility];  Service: Cardiovascular;  Laterality: N/A;   LEFT HEART CATH AND CORONARY ANGIOGRAPHY Left 04/16/2023   Procedure: LEFT HEART CATH AND CORONARY ANGIOGRAPHY;  Surgeon: Iran Ouch, MD;  Location: [facility];  Service: Cardiovascular;  Laterality: Left;   PERIPHERAL VASCULAR BALLOON ANGIOPLASTY Right 01/29/2022   Procedure: PERIPHERAL VASCULAR BALLOON ANGIOPLASTY;  Surgeon: Cephus Shelling, MD;  [facility];  Service: Cardiovascular;  Laterality: Right;   PERIPHERAL VASCULAR BALLOON ANGIOPLASTY Right 06/11/2022   Procedure: PERIPHERAL VASCULAR BALLOON ANGIOPLASTY;  Surgeon: Cephus Shelling, MD;  Location: [facility];  Service: Cardiovascular;  Laterality: Right;  Peroneal   PERIPHERAL VASCULAR BALLOON ANGIOPLASTY  09/15/2023   Procedure: PERIPHERAL VASCULAR BALLOON ANGIOPLASTY;  Surgeon: Dagoberto Ligas, MD;  Location[facility] CV LAB;  Service: Cardiovascular;;  Outflow/Inflow Cephalic Vein   PERIPHERAL VASCULAR INTERVENTION  09/15/2023   Procedure: PERIPHERAL VASCULAR INTERVENTION;  Surgeon: Dagoberto Ligas, MD;  Location: [facility] INVASIVE CV LAB;  Service: Cardiovascular;;  8x10 Viabahn   PERIPHERAL VASCULAR THROMBECTOMY N/A 09/15/2023   Procedure: PERIPHERAL VASCULAR THROMBECTOMY;  Surgeon: Dagoberto Ligas, MD;  Location: [facility] INVAS[facility] CV LAB;  Service: Cardiovascular;  Laterality: N/A;   SHOULDER ARTHROSCOPY WITH OPEN ROTATOR CUFF REPAIR Right 08/25/2018   Procedure: SHOULDER ARTHROSCOPY WITH MINI OPEN ROTATOR CUFF REPAIR;  Surgeon: Juanell Fairly, MD;  Location: [facility];  Service: Orthopedics;  Laterality: Right;   TRANSURETHRAL RESECTION OF BLADDER TUMOR N/A 08/01/2018   Procedure: TRANSURETHRAL RESECTION OF BLADDER TUMOR (TURBT);  Surgeon: Sondra Come, MD;  Location: [facility];  Service: Urology;  Laterality: N/A;   URETEROSCOPY WITH HOLMIUM LASER LITHOTRIPSY Right 08/01/2018   Procedure: URETEROSCOPY WITH HOLMIUM LASER LITHOTRIPSY;  Surgeon: Sondra Come, MD;  Location: [facility];  Service: Urology;  Laterality: Right;    Allergies:  Allergies  Allergen Reactions   Oxycodone Nausea And Vomiting   Hydrocodone Nausea And Vomiting    Medications:   Prior to Admission medications   Medication Sig Start Date End Date Taking? Authorizing Provider  aspirin EC (ASPIRIN LOW DOSE) 81 MG tablet TAKE 1 TABLET BY MOUTH EVERY MORNING (SWALLOW  WHOLE) Patient taking differently: Take 81 mg by mouth daily. 04/13/23  Yes Gollan, Tollie Pizza, MD  atorvastatin (LIPITOR) 80 MG tablet TAKE 1 TABLET BY MOUTH EVERY DAY 06/24/23  Yes Gollan, Tollie Pizza, MD  AURYXIA 1 GM 210 MG(Fe) tablet Take 420 mg by mouth 3 (three) times daily with meals. 04/22/22  Yes [provider]  calcitRIOL (ROCALTROL) 0.25 MCG capsule Take 0.25 mcg by mouth every Monday, Wednesday, and Friday with hemodialysis.   Yes [provider]  carvedilol (COREG) 12.5 MG tablet Take 1 tablet (12.5 mg total) by mouth 2 (two) times daily. 08/09/23  Yes Simmons-Robinson, Tawanna Cooler, MD  cinacalcet (SENSIPAR) 30 MG tablet Take 30 mg by mouth daily with breakfast. 09/24/23  Yes [provider]  clopidogrel (PLAVIX) 75 MG tablet TAKE 1 TABLET BY MOUTH  EVERY DAY 06/24/23  Yes Gollan, Tollie Pizza, MD  diazepam (VALIUM) 5 MG tablet Take 1 tablet (5 mg total) by mouth every 12 (twelve) hours as needed for anxiety. 09/20/23  Yes Bacigalupo, Marzella Schlein, MD  FLUoxetine (PROZAC) 20 MG capsule Take 1 capsule (20 mg total) by mouth daily. 09/20/23  Yes Bacigalupo, Marzella Schlein, MD  lidocaine-prilocaine (EMLA) cream Apply 1 Application topically daily as needed [facility] access).   Yes [provider]  multivitamin (RENA-VIT) TABS tablet Take 1 tablet by mouth daily.   Yes [provider]  nitroGLYCERIN (NITROSTAT) 0.4 MG SL tablet DISSOLVE 1 TAB UNDER TONGUE FOR CHEST PAIN - IF PAIN REMAINS AFTER 5 MIN, CALL 911 AND REPEAT DOSE. MAX 3 TABS IN 15 MINUTES 08/17/23  Yes Gollan, Tollie Pizza, MD  sildenafil (VIAGRA) 100 MG tablet Take 100 mg by mouth daily as needed (Keep arterties open). Per doctor instruction 03/30/22  Yes [provider]  torsemide (DEMADEX) 100 MG tablet Take 100 mg by mouth every morning. 01/20/22  Yes [provider]  Methoxy PEG-Epoetin Beta (MIRCERA IJ) Inject 1 each into the skin See admin instructions. During Dialysis 04/26/23   [provider]    Discontinued Meds:   Medications Discontinued During This Encounter  Medication Reason   traMADol (ULTRAM) 50 MG tablet Completed Course    Social History:  reports that he quit smoking about 16 years ago. His smoking use included cigarettes. He started smoking about 46 years ago. He has a 30 pack-year smoking history. He has been exposed to tobacco smoke. He has never used smokeless tobacco. He reports that he does not drink alcohol and does not use drugs.  Family History:   Family History  Problem Relation Age of Onset   Psoriasis Mother    Heart failure Father     Blood pressure (!) 115/53, pulse 76, resp. rate 14, weight 102.2 kg, SpO2 97%. GEN: NAD, A&Ox3, NCAT HEENT: No conjunctival pallor, EOMI NECK: Supple, no thyromegaly LUNGS: CTA B/L no rales, rhonchi or wheezing CV: RRR, No M/R/G ABD: SNDNT +BS  EXT: Left arm and especially hand swelling ACCESS: lt BCF +bruit, LIJ TC       Ethelene Hal, MD 11/04/2023, 7:32 AM

## 2023-11-05 DIAGNOSIS — Z992 Dependence on renal dialysis: Secondary | ICD-10-CM | POA: Diagnosis not present

## 2023-11-05 DIAGNOSIS — Z79899 Other long term (current) drug therapy: Secondary | ICD-10-CM | POA: Diagnosis not present

## 2023-11-05 DIAGNOSIS — N2581 Secondary hyperparathyroidism of renal origin: Secondary | ICD-10-CM | POA: Diagnosis not present

## 2023-11-05 DIAGNOSIS — D631 Anemia in chronic kidney disease: Secondary | ICD-10-CM | POA: Diagnosis not present

## 2023-11-05 DIAGNOSIS — R82998 Other abnormal findings in urine: Secondary | ICD-10-CM | POA: Diagnosis not present

## 2023-11-05 DIAGNOSIS — E440 Moderate protein-calorie malnutrition: Secondary | ICD-10-CM | POA: Diagnosis not present

## 2023-11-05 DIAGNOSIS — D509 Iron deficiency anemia, unspecified: Secondary | ICD-10-CM | POA: Diagnosis not present

## 2023-11-05 DIAGNOSIS — N184 Chronic kidney disease, stage 4 (severe): Secondary | ICD-10-CM | POA: Diagnosis not present

## 2023-11-05 DIAGNOSIS — N186 End stage renal disease: Secondary | ICD-10-CM | POA: Diagnosis not present

## 2023-11-05 DIAGNOSIS — R17 Unspecified jaundice: Secondary | ICD-10-CM | POA: Diagnosis not present

## 2023-11-05 DIAGNOSIS — E1122 Type 2 diabetes mellitus with diabetic chronic kidney disease: Secondary | ICD-10-CM | POA: Diagnosis not present

## 2023-11-08 DIAGNOSIS — E440 Moderate protein-calorie malnutrition: Secondary | ICD-10-CM | POA: Diagnosis not present

## 2023-11-08 DIAGNOSIS — R82998 Other abnormal findings in urine: Secondary | ICD-10-CM | POA: Diagnosis not present

## 2023-11-08 DIAGNOSIS — Z992 Dependence on renal dialysis: Secondary | ICD-10-CM | POA: Diagnosis not present

## 2023-11-08 DIAGNOSIS — D509 Iron deficiency anemia, unspecified: Secondary | ICD-10-CM | POA: Diagnosis not present

## 2023-11-08 DIAGNOSIS — D631 Anemia in chronic kidney disease: Secondary | ICD-10-CM | POA: Diagnosis not present

## 2023-11-08 DIAGNOSIS — N2581 Secondary hyperparathyroidism of renal origin: Secondary | ICD-10-CM | POA: Diagnosis not present

## 2023-11-08 DIAGNOSIS — R17 Unspecified jaundice: Secondary | ICD-10-CM | POA: Diagnosis not present

## 2023-11-08 DIAGNOSIS — Z79899 Other long term (current) drug therapy: Secondary | ICD-10-CM | POA: Diagnosis not present

## 2023-11-08 DIAGNOSIS — N186 End stage renal disease: Secondary | ICD-10-CM | POA: Diagnosis not present

## 2023-11-08 DIAGNOSIS — N184 Chronic kidney disease, stage 4 (severe): Secondary | ICD-10-CM | POA: Diagnosis not present

## 2023-11-08 DIAGNOSIS — E1122 Type 2 diabetes mellitus with diabetic chronic kidney disease: Secondary | ICD-10-CM | POA: Diagnosis not present

## 2023-11-09 ENCOUNTER — Encounter: Payer: Self-pay | Admitting: Family Medicine

## 2023-11-10 DIAGNOSIS — Z79899 Other long term (current) drug therapy: Secondary | ICD-10-CM | POA: Diagnosis not present

## 2023-11-10 DIAGNOSIS — E440 Moderate protein-calorie malnutrition: Secondary | ICD-10-CM | POA: Diagnosis not present

## 2023-11-10 DIAGNOSIS — E1122 Type 2 diabetes mellitus with diabetic chronic kidney disease: Secondary | ICD-10-CM | POA: Diagnosis not present

## 2023-11-10 DIAGNOSIS — R82998 Other abnormal findings in urine: Secondary | ICD-10-CM | POA: Diagnosis not present

## 2023-11-10 DIAGNOSIS — N2581 Secondary hyperparathyroidism of renal origin: Secondary | ICD-10-CM | POA: Diagnosis not present

## 2023-11-10 DIAGNOSIS — D631 Anemia in chronic kidney disease: Secondary | ICD-10-CM | POA: Diagnosis not present

## 2023-11-10 DIAGNOSIS — N184 Chronic kidney disease, stage 4 (severe): Secondary | ICD-10-CM | POA: Diagnosis not present

## 2023-11-10 DIAGNOSIS — D509 Iron deficiency anemia, unspecified: Secondary | ICD-10-CM | POA: Diagnosis not present

## 2023-11-10 DIAGNOSIS — Z992 Dependence on renal dialysis: Secondary | ICD-10-CM | POA: Diagnosis not present

## 2023-11-10 DIAGNOSIS — R17 Unspecified jaundice: Secondary | ICD-10-CM | POA: Diagnosis not present

## 2023-11-10 DIAGNOSIS — N186 End stage renal disease: Secondary | ICD-10-CM | POA: Diagnosis not present

## 2023-11-11 DIAGNOSIS — N184 Chronic kidney disease, stage 4 (severe): Secondary | ICD-10-CM | POA: Diagnosis not present

## 2023-11-11 DIAGNOSIS — N186 End stage renal disease: Secondary | ICD-10-CM | POA: Diagnosis not present

## 2023-11-11 DIAGNOSIS — R82998 Other abnormal findings in urine: Secondary | ICD-10-CM | POA: Diagnosis not present

## 2023-11-11 DIAGNOSIS — E1122 Type 2 diabetes mellitus with diabetic chronic kidney disease: Secondary | ICD-10-CM | POA: Diagnosis not present

## 2023-11-11 DIAGNOSIS — N2581 Secondary hyperparathyroidism of renal origin: Secondary | ICD-10-CM | POA: Diagnosis not present

## 2023-11-11 DIAGNOSIS — Z992 Dependence on renal dialysis: Secondary | ICD-10-CM | POA: Diagnosis not present

## 2023-11-11 DIAGNOSIS — D509 Iron deficiency anemia, unspecified: Secondary | ICD-10-CM | POA: Diagnosis not present

## 2023-11-11 DIAGNOSIS — D631 Anemia in chronic kidney disease: Secondary | ICD-10-CM | POA: Diagnosis not present

## 2023-11-11 DIAGNOSIS — R17 Unspecified jaundice: Secondary | ICD-10-CM | POA: Diagnosis not present

## 2023-11-11 DIAGNOSIS — Z79899 Other long term (current) drug therapy: Secondary | ICD-10-CM | POA: Diagnosis not present

## 2023-11-11 DIAGNOSIS — E440 Moderate protein-calorie malnutrition: Secondary | ICD-10-CM | POA: Diagnosis not present

## 2023-11-12 DIAGNOSIS — N186 End stage renal disease: Secondary | ICD-10-CM | POA: Diagnosis not present

## 2023-11-12 DIAGNOSIS — Z992 Dependence on renal dialysis: Secondary | ICD-10-CM | POA: Diagnosis not present

## 2023-11-12 DIAGNOSIS — E1122 Type 2 diabetes mellitus with diabetic chronic kidney disease: Secondary | ICD-10-CM | POA: Diagnosis not present

## 2023-11-12 DIAGNOSIS — D509 Iron deficiency anemia, unspecified: Secondary | ICD-10-CM | POA: Diagnosis not present

## 2023-11-12 DIAGNOSIS — D631 Anemia in chronic kidney disease: Secondary | ICD-10-CM | POA: Diagnosis not present

## 2023-11-12 DIAGNOSIS — N184 Chronic kidney disease, stage 4 (severe): Secondary | ICD-10-CM | POA: Diagnosis not present

## 2023-11-12 DIAGNOSIS — N2581 Secondary hyperparathyroidism of renal origin: Secondary | ICD-10-CM | POA: Diagnosis not present

## 2023-11-12 DIAGNOSIS — E440 Moderate protein-calorie malnutrition: Secondary | ICD-10-CM | POA: Diagnosis not present

## 2023-11-12 DIAGNOSIS — Z79899 Other long term (current) drug therapy: Secondary | ICD-10-CM | POA: Diagnosis not present

## 2023-11-12 DIAGNOSIS — R17 Unspecified jaundice: Secondary | ICD-10-CM | POA: Diagnosis not present

## 2023-11-12 DIAGNOSIS — R82998 Other abnormal findings in urine: Secondary | ICD-10-CM | POA: Diagnosis not present

## 2023-11-15 DIAGNOSIS — N184 Chronic kidney disease, stage 4 (severe): Secondary | ICD-10-CM | POA: Diagnosis not present

## 2023-11-15 DIAGNOSIS — Z992 Dependence on renal dialysis: Secondary | ICD-10-CM | POA: Diagnosis not present

## 2023-11-15 DIAGNOSIS — E440 Moderate protein-calorie malnutrition: Secondary | ICD-10-CM | POA: Diagnosis not present

## 2023-11-15 DIAGNOSIS — N2581 Secondary hyperparathyroidism of renal origin: Secondary | ICD-10-CM | POA: Diagnosis not present

## 2023-11-15 DIAGNOSIS — E1122 Type 2 diabetes mellitus with diabetic chronic kidney disease: Secondary | ICD-10-CM | POA: Diagnosis not present

## 2023-11-15 DIAGNOSIS — R17 Unspecified jaundice: Secondary | ICD-10-CM | POA: Diagnosis not present

## 2023-11-15 DIAGNOSIS — D509 Iron deficiency anemia, unspecified: Secondary | ICD-10-CM | POA: Diagnosis not present

## 2023-11-15 DIAGNOSIS — Z79899 Other long term (current) drug therapy: Secondary | ICD-10-CM | POA: Diagnosis not present

## 2023-11-15 DIAGNOSIS — N186 End stage renal disease: Secondary | ICD-10-CM | POA: Diagnosis not present

## 2023-11-15 DIAGNOSIS — D631 Anemia in chronic kidney disease: Secondary | ICD-10-CM | POA: Diagnosis not present

## 2023-11-15 DIAGNOSIS — R82998 Other abnormal findings in urine: Secondary | ICD-10-CM | POA: Diagnosis not present

## 2023-11-16 ENCOUNTER — Ambulatory Visit: Payer: Medicare Other | Admitting: Family Medicine

## 2023-11-16 DIAGNOSIS — D509 Iron deficiency anemia, unspecified: Secondary | ICD-10-CM | POA: Diagnosis not present

## 2023-11-16 DIAGNOSIS — N184 Chronic kidney disease, stage 4 (severe): Secondary | ICD-10-CM | POA: Diagnosis not present

## 2023-11-16 DIAGNOSIS — N186 End stage renal disease: Secondary | ICD-10-CM | POA: Diagnosis not present

## 2023-11-16 DIAGNOSIS — E1122 Type 2 diabetes mellitus with diabetic chronic kidney disease: Secondary | ICD-10-CM | POA: Diagnosis not present

## 2023-11-16 DIAGNOSIS — R17 Unspecified jaundice: Secondary | ICD-10-CM | POA: Diagnosis not present

## 2023-11-16 DIAGNOSIS — Z79899 Other long term (current) drug therapy: Secondary | ICD-10-CM | POA: Diagnosis not present

## 2023-11-16 DIAGNOSIS — Z992 Dependence on renal dialysis: Secondary | ICD-10-CM | POA: Diagnosis not present

## 2023-11-16 DIAGNOSIS — N2581 Secondary hyperparathyroidism of renal origin: Secondary | ICD-10-CM | POA: Diagnosis not present

## 2023-11-16 DIAGNOSIS — R82998 Other abnormal findings in urine: Secondary | ICD-10-CM | POA: Diagnosis not present

## 2023-11-16 DIAGNOSIS — E440 Moderate protein-calorie malnutrition: Secondary | ICD-10-CM | POA: Diagnosis not present

## 2023-11-16 DIAGNOSIS — D631 Anemia in chronic kidney disease: Secondary | ICD-10-CM | POA: Diagnosis not present

## 2023-11-17 DIAGNOSIS — R82998 Other abnormal findings in urine: Secondary | ICD-10-CM | POA: Diagnosis not present

## 2023-11-17 DIAGNOSIS — Z992 Dependence on renal dialysis: Secondary | ICD-10-CM | POA: Diagnosis not present

## 2023-11-17 DIAGNOSIS — N186 End stage renal disease: Secondary | ICD-10-CM | POA: Diagnosis not present

## 2023-11-17 DIAGNOSIS — N184 Chronic kidney disease, stage 4 (severe): Secondary | ICD-10-CM | POA: Diagnosis not present

## 2023-11-17 DIAGNOSIS — E440 Moderate protein-calorie malnutrition: Secondary | ICD-10-CM | POA: Diagnosis not present

## 2023-11-17 DIAGNOSIS — D509 Iron deficiency anemia, unspecified: Secondary | ICD-10-CM | POA: Diagnosis not present

## 2023-11-17 DIAGNOSIS — R17 Unspecified jaundice: Secondary | ICD-10-CM | POA: Diagnosis not present

## 2023-11-17 DIAGNOSIS — E1122 Type 2 diabetes mellitus with diabetic chronic kidney disease: Secondary | ICD-10-CM | POA: Diagnosis not present

## 2023-11-17 DIAGNOSIS — N2581 Secondary hyperparathyroidism of renal origin: Secondary | ICD-10-CM | POA: Diagnosis not present

## 2023-11-17 DIAGNOSIS — Z79899 Other long term (current) drug therapy: Secondary | ICD-10-CM | POA: Diagnosis not present

## 2023-11-17 DIAGNOSIS — D631 Anemia in chronic kidney disease: Secondary | ICD-10-CM | POA: Diagnosis not present

## 2023-11-18 DIAGNOSIS — N186 End stage renal disease: Secondary | ICD-10-CM | POA: Diagnosis not present

## 2023-11-18 DIAGNOSIS — Z79899 Other long term (current) drug therapy: Secondary | ICD-10-CM | POA: Diagnosis not present

## 2023-11-18 DIAGNOSIS — R82998 Other abnormal findings in urine: Secondary | ICD-10-CM | POA: Diagnosis not present

## 2023-11-18 DIAGNOSIS — E440 Moderate protein-calorie malnutrition: Secondary | ICD-10-CM | POA: Diagnosis not present

## 2023-11-18 DIAGNOSIS — R17 Unspecified jaundice: Secondary | ICD-10-CM | POA: Diagnosis not present

## 2023-11-18 DIAGNOSIS — Z992 Dependence on renal dialysis: Secondary | ICD-10-CM | POA: Diagnosis not present

## 2023-11-18 DIAGNOSIS — N184 Chronic kidney disease, stage 4 (severe): Secondary | ICD-10-CM | POA: Diagnosis not present

## 2023-11-18 DIAGNOSIS — D631 Anemia in chronic kidney disease: Secondary | ICD-10-CM | POA: Diagnosis not present

## 2023-11-18 DIAGNOSIS — N2581 Secondary hyperparathyroidism of renal origin: Secondary | ICD-10-CM | POA: Diagnosis not present

## 2023-11-18 DIAGNOSIS — D509 Iron deficiency anemia, unspecified: Secondary | ICD-10-CM | POA: Diagnosis not present

## 2023-11-18 DIAGNOSIS — E1122 Type 2 diabetes mellitus with diabetic chronic kidney disease: Secondary | ICD-10-CM | POA: Diagnosis not present

## 2023-11-19 DIAGNOSIS — N2581 Secondary hyperparathyroidism of renal origin: Secondary | ICD-10-CM | POA: Diagnosis not present

## 2023-11-19 DIAGNOSIS — Z992 Dependence on renal dialysis: Secondary | ICD-10-CM | POA: Diagnosis not present

## 2023-11-19 DIAGNOSIS — E440 Moderate protein-calorie malnutrition: Secondary | ICD-10-CM | POA: Diagnosis not present

## 2023-11-19 DIAGNOSIS — R17 Unspecified jaundice: Secondary | ICD-10-CM | POA: Diagnosis not present

## 2023-11-19 DIAGNOSIS — Z79899 Other long term (current) drug therapy: Secondary | ICD-10-CM | POA: Diagnosis not present

## 2023-11-19 DIAGNOSIS — N184 Chronic kidney disease, stage 4 (severe): Secondary | ICD-10-CM | POA: Diagnosis not present

## 2023-11-19 DIAGNOSIS — E1122 Type 2 diabetes mellitus with diabetic chronic kidney disease: Secondary | ICD-10-CM | POA: Diagnosis not present

## 2023-11-19 DIAGNOSIS — D631 Anemia in chronic kidney disease: Secondary | ICD-10-CM | POA: Diagnosis not present

## 2023-11-19 DIAGNOSIS — R82998 Other abnormal findings in urine: Secondary | ICD-10-CM | POA: Diagnosis not present

## 2023-11-19 DIAGNOSIS — N186 End stage renal disease: Secondary | ICD-10-CM | POA: Diagnosis not present

## 2023-11-19 DIAGNOSIS — D509 Iron deficiency anemia, unspecified: Secondary | ICD-10-CM | POA: Diagnosis not present

## 2023-11-22 DIAGNOSIS — E440 Moderate protein-calorie malnutrition: Secondary | ICD-10-CM | POA: Diagnosis not present

## 2023-11-22 DIAGNOSIS — N2581 Secondary hyperparathyroidism of renal origin: Secondary | ICD-10-CM | POA: Diagnosis not present

## 2023-11-22 DIAGNOSIS — D509 Iron deficiency anemia, unspecified: Secondary | ICD-10-CM | POA: Diagnosis not present

## 2023-11-22 DIAGNOSIS — Z79899 Other long term (current) drug therapy: Secondary | ICD-10-CM | POA: Diagnosis not present

## 2023-11-22 DIAGNOSIS — R82998 Other abnormal findings in urine: Secondary | ICD-10-CM | POA: Diagnosis not present

## 2023-11-22 DIAGNOSIS — N184 Chronic kidney disease, stage 4 (severe): Secondary | ICD-10-CM | POA: Diagnosis not present

## 2023-11-22 DIAGNOSIS — E1122 Type 2 diabetes mellitus with diabetic chronic kidney disease: Secondary | ICD-10-CM | POA: Diagnosis not present

## 2023-11-22 DIAGNOSIS — D631 Anemia in chronic kidney disease: Secondary | ICD-10-CM | POA: Diagnosis not present

## 2023-11-22 DIAGNOSIS — R17 Unspecified jaundice: Secondary | ICD-10-CM | POA: Diagnosis not present

## 2023-11-22 DIAGNOSIS — Z992 Dependence on renal dialysis: Secondary | ICD-10-CM | POA: Diagnosis not present

## 2023-11-22 DIAGNOSIS — N186 End stage renal disease: Secondary | ICD-10-CM | POA: Diagnosis not present

## 2023-11-23 DIAGNOSIS — G5601 Carpal tunnel syndrome, right upper limb: Secondary | ICD-10-CM | POA: Diagnosis not present

## 2023-11-23 DIAGNOSIS — M25642 Stiffness of left hand, not elsewhere classified: Secondary | ICD-10-CM | POA: Diagnosis not present

## 2023-11-24 DIAGNOSIS — E1122 Type 2 diabetes mellitus with diabetic chronic kidney disease: Secondary | ICD-10-CM | POA: Diagnosis not present

## 2023-11-24 DIAGNOSIS — E440 Moderate protein-calorie malnutrition: Secondary | ICD-10-CM | POA: Diagnosis not present

## 2023-11-24 DIAGNOSIS — R82998 Other abnormal findings in urine: Secondary | ICD-10-CM | POA: Diagnosis not present

## 2023-11-24 DIAGNOSIS — N2581 Secondary hyperparathyroidism of renal origin: Secondary | ICD-10-CM | POA: Diagnosis not present

## 2023-11-24 DIAGNOSIS — R17 Unspecified jaundice: Secondary | ICD-10-CM | POA: Diagnosis not present

## 2023-11-24 DIAGNOSIS — D631 Anemia in chronic kidney disease: Secondary | ICD-10-CM | POA: Diagnosis not present

## 2023-11-24 DIAGNOSIS — N184 Chronic kidney disease, stage 4 (severe): Secondary | ICD-10-CM | POA: Diagnosis not present

## 2023-11-24 DIAGNOSIS — D509 Iron deficiency anemia, unspecified: Secondary | ICD-10-CM | POA: Diagnosis not present

## 2023-11-24 DIAGNOSIS — Z992 Dependence on renal dialysis: Secondary | ICD-10-CM | POA: Diagnosis not present

## 2023-11-24 DIAGNOSIS — N186 End stage renal disease: Secondary | ICD-10-CM | POA: Diagnosis not present

## 2023-11-24 DIAGNOSIS — Z79899 Other long term (current) drug therapy: Secondary | ICD-10-CM | POA: Diagnosis not present

## 2023-11-26 DIAGNOSIS — R82998 Other abnormal findings in urine: Secondary | ICD-10-CM | POA: Diagnosis not present

## 2023-11-26 DIAGNOSIS — R17 Unspecified jaundice: Secondary | ICD-10-CM | POA: Diagnosis not present

## 2023-11-26 DIAGNOSIS — D509 Iron deficiency anemia, unspecified: Secondary | ICD-10-CM | POA: Diagnosis not present

## 2023-11-26 DIAGNOSIS — E1122 Type 2 diabetes mellitus with diabetic chronic kidney disease: Secondary | ICD-10-CM | POA: Diagnosis not present

## 2023-11-26 DIAGNOSIS — D631 Anemia in chronic kidney disease: Secondary | ICD-10-CM | POA: Diagnosis not present

## 2023-11-26 DIAGNOSIS — E440 Moderate protein-calorie malnutrition: Secondary | ICD-10-CM | POA: Diagnosis not present

## 2023-11-26 DIAGNOSIS — Z79899 Other long term (current) drug therapy: Secondary | ICD-10-CM | POA: Diagnosis not present

## 2023-11-26 DIAGNOSIS — N186 End stage renal disease: Secondary | ICD-10-CM | POA: Diagnosis not present

## 2023-11-26 DIAGNOSIS — Z992 Dependence on renal dialysis: Secondary | ICD-10-CM | POA: Diagnosis not present

## 2023-11-26 DIAGNOSIS — N184 Chronic kidney disease, stage 4 (severe): Secondary | ICD-10-CM | POA: Diagnosis not present

## 2023-11-26 DIAGNOSIS — N2581 Secondary hyperparathyroidism of renal origin: Secondary | ICD-10-CM | POA: Diagnosis not present

## 2023-11-29 DIAGNOSIS — N2581 Secondary hyperparathyroidism of renal origin: Secondary | ICD-10-CM | POA: Diagnosis not present

## 2023-11-29 DIAGNOSIS — N186 End stage renal disease: Secondary | ICD-10-CM | POA: Diagnosis not present

## 2023-11-29 DIAGNOSIS — E440 Moderate protein-calorie malnutrition: Secondary | ICD-10-CM | POA: Diagnosis not present

## 2023-11-29 DIAGNOSIS — E1122 Type 2 diabetes mellitus with diabetic chronic kidney disease: Secondary | ICD-10-CM | POA: Diagnosis not present

## 2023-11-29 DIAGNOSIS — D509 Iron deficiency anemia, unspecified: Secondary | ICD-10-CM | POA: Diagnosis not present

## 2023-11-29 DIAGNOSIS — Z79899 Other long term (current) drug therapy: Secondary | ICD-10-CM | POA: Diagnosis not present

## 2023-11-29 DIAGNOSIS — R82998 Other abnormal findings in urine: Secondary | ICD-10-CM | POA: Diagnosis not present

## 2023-11-29 DIAGNOSIS — N184 Chronic kidney disease, stage 4 (severe): Secondary | ICD-10-CM | POA: Diagnosis not present

## 2023-11-29 DIAGNOSIS — Z992 Dependence on renal dialysis: Secondary | ICD-10-CM | POA: Diagnosis not present

## 2023-11-29 DIAGNOSIS — D631 Anemia in chronic kidney disease: Secondary | ICD-10-CM | POA: Diagnosis not present

## 2023-11-29 DIAGNOSIS — R17 Unspecified jaundice: Secondary | ICD-10-CM | POA: Diagnosis not present

## 2023-11-30 DIAGNOSIS — M25641 Stiffness of right hand, not elsewhere classified: Secondary | ICD-10-CM | POA: Diagnosis not present

## 2023-11-30 DIAGNOSIS — M79641 Pain in right hand: Secondary | ICD-10-CM | POA: Diagnosis not present

## 2023-12-01 DIAGNOSIS — E440 Moderate protein-calorie malnutrition: Secondary | ICD-10-CM | POA: Diagnosis not present

## 2023-12-01 DIAGNOSIS — Z79899 Other long term (current) drug therapy: Secondary | ICD-10-CM | POA: Diagnosis not present

## 2023-12-01 DIAGNOSIS — N186 End stage renal disease: Secondary | ICD-10-CM | POA: Diagnosis not present

## 2023-12-01 DIAGNOSIS — R17 Unspecified jaundice: Secondary | ICD-10-CM | POA: Diagnosis not present

## 2023-12-01 DIAGNOSIS — D631 Anemia in chronic kidney disease: Secondary | ICD-10-CM | POA: Diagnosis not present

## 2023-12-01 DIAGNOSIS — D509 Iron deficiency anemia, unspecified: Secondary | ICD-10-CM | POA: Diagnosis not present

## 2023-12-01 DIAGNOSIS — I129 Hypertensive chronic kidney disease with stage 1 through stage 4 chronic kidney disease, or unspecified chronic kidney disease: Secondary | ICD-10-CM | POA: Diagnosis not present

## 2023-12-01 DIAGNOSIS — N184 Chronic kidney disease, stage 4 (severe): Secondary | ICD-10-CM | POA: Diagnosis not present

## 2023-12-01 DIAGNOSIS — Z992 Dependence on renal dialysis: Secondary | ICD-10-CM | POA: Diagnosis not present

## 2023-12-01 DIAGNOSIS — E1122 Type 2 diabetes mellitus with diabetic chronic kidney disease: Secondary | ICD-10-CM | POA: Diagnosis not present

## 2023-12-01 DIAGNOSIS — N2581 Secondary hyperparathyroidism of renal origin: Secondary | ICD-10-CM | POA: Diagnosis not present

## 2023-12-01 DIAGNOSIS — R82998 Other abnormal findings in urine: Secondary | ICD-10-CM | POA: Diagnosis not present

## 2023-12-03 DIAGNOSIS — D509 Iron deficiency anemia, unspecified: Secondary | ICD-10-CM | POA: Diagnosis not present

## 2023-12-03 DIAGNOSIS — N2581 Secondary hyperparathyroidism of renal origin: Secondary | ICD-10-CM | POA: Diagnosis not present

## 2023-12-03 DIAGNOSIS — D631 Anemia in chronic kidney disease: Secondary | ICD-10-CM | POA: Diagnosis not present

## 2023-12-03 DIAGNOSIS — Z992 Dependence on renal dialysis: Secondary | ICD-10-CM | POA: Diagnosis not present

## 2023-12-03 DIAGNOSIS — Z79899 Other long term (current) drug therapy: Secondary | ICD-10-CM | POA: Diagnosis not present

## 2023-12-03 DIAGNOSIS — E440 Moderate protein-calorie malnutrition: Secondary | ICD-10-CM | POA: Diagnosis not present

## 2023-12-03 DIAGNOSIS — N186 End stage renal disease: Secondary | ICD-10-CM | POA: Diagnosis not present

## 2023-12-03 DIAGNOSIS — R17 Unspecified jaundice: Secondary | ICD-10-CM | POA: Diagnosis not present

## 2023-12-03 DIAGNOSIS — R82998 Other abnormal findings in urine: Secondary | ICD-10-CM | POA: Diagnosis not present

## 2023-12-03 DIAGNOSIS — N184 Chronic kidney disease, stage 4 (severe): Secondary | ICD-10-CM | POA: Diagnosis not present

## 2023-12-06 DIAGNOSIS — D631 Anemia in chronic kidney disease: Secondary | ICD-10-CM | POA: Diagnosis not present

## 2023-12-06 DIAGNOSIS — R17 Unspecified jaundice: Secondary | ICD-10-CM | POA: Diagnosis not present

## 2023-12-06 DIAGNOSIS — N2581 Secondary hyperparathyroidism of renal origin: Secondary | ICD-10-CM | POA: Diagnosis not present

## 2023-12-06 DIAGNOSIS — D509 Iron deficiency anemia, unspecified: Secondary | ICD-10-CM | POA: Diagnosis not present

## 2023-12-06 DIAGNOSIS — R82998 Other abnormal findings in urine: Secondary | ICD-10-CM | POA: Diagnosis not present

## 2023-12-06 DIAGNOSIS — N186 End stage renal disease: Secondary | ICD-10-CM | POA: Diagnosis not present

## 2023-12-06 DIAGNOSIS — N184 Chronic kidney disease, stage 4 (severe): Secondary | ICD-10-CM | POA: Diagnosis not present

## 2023-12-06 DIAGNOSIS — E440 Moderate protein-calorie malnutrition: Secondary | ICD-10-CM | POA: Diagnosis not present

## 2023-12-06 DIAGNOSIS — Z992 Dependence on renal dialysis: Secondary | ICD-10-CM | POA: Diagnosis not present

## 2023-12-06 DIAGNOSIS — Z79899 Other long term (current) drug therapy: Secondary | ICD-10-CM | POA: Diagnosis not present

## 2023-12-07 DIAGNOSIS — M79641 Pain in right hand: Secondary | ICD-10-CM | POA: Diagnosis not present

## 2023-12-07 DIAGNOSIS — M25641 Stiffness of right hand, not elsewhere classified: Secondary | ICD-10-CM | POA: Diagnosis not present

## 2023-12-08 DIAGNOSIS — R17 Unspecified jaundice: Secondary | ICD-10-CM | POA: Diagnosis not present

## 2023-12-08 DIAGNOSIS — R82998 Other abnormal findings in urine: Secondary | ICD-10-CM | POA: Diagnosis not present

## 2023-12-08 DIAGNOSIS — Z79899 Other long term (current) drug therapy: Secondary | ICD-10-CM | POA: Diagnosis not present

## 2023-12-08 DIAGNOSIS — D509 Iron deficiency anemia, unspecified: Secondary | ICD-10-CM | POA: Diagnosis not present

## 2023-12-08 DIAGNOSIS — Z992 Dependence on renal dialysis: Secondary | ICD-10-CM | POA: Diagnosis not present

## 2023-12-08 DIAGNOSIS — N184 Chronic kidney disease, stage 4 (severe): Secondary | ICD-10-CM | POA: Diagnosis not present

## 2023-12-08 DIAGNOSIS — N186 End stage renal disease: Secondary | ICD-10-CM | POA: Diagnosis not present

## 2023-12-08 DIAGNOSIS — N2581 Secondary hyperparathyroidism of renal origin: Secondary | ICD-10-CM | POA: Diagnosis not present

## 2023-12-08 DIAGNOSIS — D631 Anemia in chronic kidney disease: Secondary | ICD-10-CM | POA: Diagnosis not present

## 2023-12-08 DIAGNOSIS — E440 Moderate protein-calorie malnutrition: Secondary | ICD-10-CM | POA: Diagnosis not present

## 2023-12-10 DIAGNOSIS — N184 Chronic kidney disease, stage 4 (severe): Secondary | ICD-10-CM | POA: Diagnosis not present

## 2023-12-10 DIAGNOSIS — Z992 Dependence on renal dialysis: Secondary | ICD-10-CM | POA: Diagnosis not present

## 2023-12-10 DIAGNOSIS — D509 Iron deficiency anemia, unspecified: Secondary | ICD-10-CM | POA: Diagnosis not present

## 2023-12-10 DIAGNOSIS — R82998 Other abnormal findings in urine: Secondary | ICD-10-CM | POA: Diagnosis not present

## 2023-12-10 DIAGNOSIS — N186 End stage renal disease: Secondary | ICD-10-CM | POA: Diagnosis not present

## 2023-12-10 DIAGNOSIS — R17 Unspecified jaundice: Secondary | ICD-10-CM | POA: Diagnosis not present

## 2023-12-10 DIAGNOSIS — E440 Moderate protein-calorie malnutrition: Secondary | ICD-10-CM | POA: Diagnosis not present

## 2023-12-10 DIAGNOSIS — N2581 Secondary hyperparathyroidism of renal origin: Secondary | ICD-10-CM | POA: Diagnosis not present

## 2023-12-10 DIAGNOSIS — Z79899 Other long term (current) drug therapy: Secondary | ICD-10-CM | POA: Diagnosis not present

## 2023-12-10 DIAGNOSIS — D631 Anemia in chronic kidney disease: Secondary | ICD-10-CM | POA: Diagnosis not present

## 2023-12-13 DIAGNOSIS — N184 Chronic kidney disease, stage 4 (severe): Secondary | ICD-10-CM | POA: Diagnosis not present

## 2023-12-13 DIAGNOSIS — N2581 Secondary hyperparathyroidism of renal origin: Secondary | ICD-10-CM | POA: Diagnosis not present

## 2023-12-13 DIAGNOSIS — R82998 Other abnormal findings in urine: Secondary | ICD-10-CM | POA: Diagnosis not present

## 2023-12-13 DIAGNOSIS — Z992 Dependence on renal dialysis: Secondary | ICD-10-CM | POA: Diagnosis not present

## 2023-12-13 DIAGNOSIS — E440 Moderate protein-calorie malnutrition: Secondary | ICD-10-CM | POA: Diagnosis not present

## 2023-12-13 DIAGNOSIS — D631 Anemia in chronic kidney disease: Secondary | ICD-10-CM | POA: Diagnosis not present

## 2023-12-13 DIAGNOSIS — R17 Unspecified jaundice: Secondary | ICD-10-CM | POA: Diagnosis not present

## 2023-12-13 DIAGNOSIS — D509 Iron deficiency anemia, unspecified: Secondary | ICD-10-CM | POA: Diagnosis not present

## 2023-12-13 DIAGNOSIS — N186 End stage renal disease: Secondary | ICD-10-CM | POA: Diagnosis not present

## 2023-12-13 DIAGNOSIS — Z79899 Other long term (current) drug therapy: Secondary | ICD-10-CM | POA: Diagnosis not present

## 2023-12-15 DIAGNOSIS — E440 Moderate protein-calorie malnutrition: Secondary | ICD-10-CM | POA: Diagnosis not present

## 2023-12-15 DIAGNOSIS — N2581 Secondary hyperparathyroidism of renal origin: Secondary | ICD-10-CM | POA: Diagnosis not present

## 2023-12-15 DIAGNOSIS — R82998 Other abnormal findings in urine: Secondary | ICD-10-CM | POA: Diagnosis not present

## 2023-12-15 DIAGNOSIS — D509 Iron deficiency anemia, unspecified: Secondary | ICD-10-CM | POA: Diagnosis not present

## 2023-12-15 DIAGNOSIS — N186 End stage renal disease: Secondary | ICD-10-CM | POA: Diagnosis not present

## 2023-12-15 DIAGNOSIS — N184 Chronic kidney disease, stage 4 (severe): Secondary | ICD-10-CM | POA: Diagnosis not present

## 2023-12-15 DIAGNOSIS — Z992 Dependence on renal dialysis: Secondary | ICD-10-CM | POA: Diagnosis not present

## 2023-12-15 DIAGNOSIS — D631 Anemia in chronic kidney disease: Secondary | ICD-10-CM | POA: Diagnosis not present

## 2023-12-15 DIAGNOSIS — Z79899 Other long term (current) drug therapy: Secondary | ICD-10-CM | POA: Diagnosis not present

## 2023-12-15 DIAGNOSIS — R17 Unspecified jaundice: Secondary | ICD-10-CM | POA: Diagnosis not present

## 2023-12-16 DIAGNOSIS — M25641 Stiffness of right hand, not elsewhere classified: Secondary | ICD-10-CM | POA: Diagnosis not present

## 2023-12-16 DIAGNOSIS — M79641 Pain in right hand: Secondary | ICD-10-CM | POA: Diagnosis not present

## 2023-12-17 DIAGNOSIS — R82998 Other abnormal findings in urine: Secondary | ICD-10-CM | POA: Diagnosis not present

## 2023-12-17 DIAGNOSIS — N184 Chronic kidney disease, stage 4 (severe): Secondary | ICD-10-CM | POA: Diagnosis not present

## 2023-12-17 DIAGNOSIS — N2581 Secondary hyperparathyroidism of renal origin: Secondary | ICD-10-CM | POA: Diagnosis not present

## 2023-12-17 DIAGNOSIS — Z79899 Other long term (current) drug therapy: Secondary | ICD-10-CM | POA: Diagnosis not present

## 2023-12-17 DIAGNOSIS — D631 Anemia in chronic kidney disease: Secondary | ICD-10-CM | POA: Diagnosis not present

## 2023-12-17 DIAGNOSIS — D509 Iron deficiency anemia, unspecified: Secondary | ICD-10-CM | POA: Diagnosis not present

## 2023-12-17 DIAGNOSIS — R17 Unspecified jaundice: Secondary | ICD-10-CM | POA: Diagnosis not present

## 2023-12-17 DIAGNOSIS — E440 Moderate protein-calorie malnutrition: Secondary | ICD-10-CM | POA: Diagnosis not present

## 2023-12-17 DIAGNOSIS — N186 End stage renal disease: Secondary | ICD-10-CM | POA: Diagnosis not present

## 2023-12-17 DIAGNOSIS — Z992 Dependence on renal dialysis: Secondary | ICD-10-CM | POA: Diagnosis not present

## 2023-12-20 DIAGNOSIS — E440 Moderate protein-calorie malnutrition: Secondary | ICD-10-CM | POA: Diagnosis not present

## 2023-12-20 DIAGNOSIS — Z79899 Other long term (current) drug therapy: Secondary | ICD-10-CM | POA: Diagnosis not present

## 2023-12-20 DIAGNOSIS — R82998 Other abnormal findings in urine: Secondary | ICD-10-CM | POA: Diagnosis not present

## 2023-12-20 DIAGNOSIS — D631 Anemia in chronic kidney disease: Secondary | ICD-10-CM | POA: Diagnosis not present

## 2023-12-20 DIAGNOSIS — Z992 Dependence on renal dialysis: Secondary | ICD-10-CM | POA: Diagnosis not present

## 2023-12-20 DIAGNOSIS — D509 Iron deficiency anemia, unspecified: Secondary | ICD-10-CM | POA: Diagnosis not present

## 2023-12-20 DIAGNOSIS — N184 Chronic kidney disease, stage 4 (severe): Secondary | ICD-10-CM | POA: Diagnosis not present

## 2023-12-20 DIAGNOSIS — R17 Unspecified jaundice: Secondary | ICD-10-CM | POA: Diagnosis not present

## 2023-12-20 DIAGNOSIS — N2581 Secondary hyperparathyroidism of renal origin: Secondary | ICD-10-CM | POA: Diagnosis not present

## 2023-12-20 DIAGNOSIS — N186 End stage renal disease: Secondary | ICD-10-CM | POA: Diagnosis not present

## 2023-12-22 DIAGNOSIS — R17 Unspecified jaundice: Secondary | ICD-10-CM | POA: Diagnosis not present

## 2023-12-22 DIAGNOSIS — N2581 Secondary hyperparathyroidism of renal origin: Secondary | ICD-10-CM | POA: Diagnosis not present

## 2023-12-22 DIAGNOSIS — R82998 Other abnormal findings in urine: Secondary | ICD-10-CM | POA: Diagnosis not present

## 2023-12-22 DIAGNOSIS — E440 Moderate protein-calorie malnutrition: Secondary | ICD-10-CM | POA: Diagnosis not present

## 2023-12-22 DIAGNOSIS — Z992 Dependence on renal dialysis: Secondary | ICD-10-CM | POA: Diagnosis not present

## 2023-12-22 DIAGNOSIS — N184 Chronic kidney disease, stage 4 (severe): Secondary | ICD-10-CM | POA: Diagnosis not present

## 2023-12-22 DIAGNOSIS — D631 Anemia in chronic kidney disease: Secondary | ICD-10-CM | POA: Diagnosis not present

## 2023-12-22 DIAGNOSIS — Z79899 Other long term (current) drug therapy: Secondary | ICD-10-CM | POA: Diagnosis not present

## 2023-12-22 DIAGNOSIS — D509 Iron deficiency anemia, unspecified: Secondary | ICD-10-CM | POA: Diagnosis not present

## 2023-12-22 DIAGNOSIS — N186 End stage renal disease: Secondary | ICD-10-CM | POA: Diagnosis not present

## 2023-12-23 DIAGNOSIS — M25641 Stiffness of right hand, not elsewhere classified: Secondary | ICD-10-CM | POA: Diagnosis not present

## 2023-12-23 DIAGNOSIS — M79641 Pain in right hand: Secondary | ICD-10-CM | POA: Diagnosis not present

## 2023-12-24 DIAGNOSIS — N184 Chronic kidney disease, stage 4 (severe): Secondary | ICD-10-CM | POA: Diagnosis not present

## 2023-12-24 DIAGNOSIS — R17 Unspecified jaundice: Secondary | ICD-10-CM | POA: Diagnosis not present

## 2023-12-24 DIAGNOSIS — E440 Moderate protein-calorie malnutrition: Secondary | ICD-10-CM | POA: Diagnosis not present

## 2023-12-24 DIAGNOSIS — D509 Iron deficiency anemia, unspecified: Secondary | ICD-10-CM | POA: Diagnosis not present

## 2023-12-24 DIAGNOSIS — N186 End stage renal disease: Secondary | ICD-10-CM | POA: Diagnosis not present

## 2023-12-24 DIAGNOSIS — Z79899 Other long term (current) drug therapy: Secondary | ICD-10-CM | POA: Diagnosis not present

## 2023-12-24 DIAGNOSIS — D631 Anemia in chronic kidney disease: Secondary | ICD-10-CM | POA: Diagnosis not present

## 2023-12-24 DIAGNOSIS — N2581 Secondary hyperparathyroidism of renal origin: Secondary | ICD-10-CM | POA: Diagnosis not present

## 2023-12-24 DIAGNOSIS — R82998 Other abnormal findings in urine: Secondary | ICD-10-CM | POA: Diagnosis not present

## 2023-12-24 DIAGNOSIS — Z992 Dependence on renal dialysis: Secondary | ICD-10-CM | POA: Diagnosis not present

## 2023-12-27 DIAGNOSIS — N186 End stage renal disease: Secondary | ICD-10-CM | POA: Diagnosis not present

## 2023-12-27 DIAGNOSIS — Z992 Dependence on renal dialysis: Secondary | ICD-10-CM | POA: Diagnosis not present

## 2023-12-27 DIAGNOSIS — Z79899 Other long term (current) drug therapy: Secondary | ICD-10-CM | POA: Diagnosis not present

## 2023-12-27 DIAGNOSIS — R17 Unspecified jaundice: Secondary | ICD-10-CM | POA: Diagnosis not present

## 2023-12-27 DIAGNOSIS — D509 Iron deficiency anemia, unspecified: Secondary | ICD-10-CM | POA: Diagnosis not present

## 2023-12-27 DIAGNOSIS — N184 Chronic kidney disease, stage 4 (severe): Secondary | ICD-10-CM | POA: Diagnosis not present

## 2023-12-27 DIAGNOSIS — N2581 Secondary hyperparathyroidism of renal origin: Secondary | ICD-10-CM | POA: Diagnosis not present

## 2023-12-27 DIAGNOSIS — D631 Anemia in chronic kidney disease: Secondary | ICD-10-CM | POA: Diagnosis not present

## 2023-12-27 DIAGNOSIS — E440 Moderate protein-calorie malnutrition: Secondary | ICD-10-CM | POA: Diagnosis not present

## 2023-12-27 DIAGNOSIS — R82998 Other abnormal findings in urine: Secondary | ICD-10-CM | POA: Diagnosis not present

## 2023-12-29 DIAGNOSIS — R17 Unspecified jaundice: Secondary | ICD-10-CM | POA: Diagnosis not present

## 2023-12-29 DIAGNOSIS — D509 Iron deficiency anemia, unspecified: Secondary | ICD-10-CM | POA: Diagnosis not present

## 2023-12-29 DIAGNOSIS — N186 End stage renal disease: Secondary | ICD-10-CM | POA: Diagnosis not present

## 2023-12-29 DIAGNOSIS — E440 Moderate protein-calorie malnutrition: Secondary | ICD-10-CM | POA: Diagnosis not present

## 2023-12-29 DIAGNOSIS — N184 Chronic kidney disease, stage 4 (severe): Secondary | ICD-10-CM | POA: Diagnosis not present

## 2023-12-29 DIAGNOSIS — Z992 Dependence on renal dialysis: Secondary | ICD-10-CM | POA: Diagnosis not present

## 2023-12-29 DIAGNOSIS — N2581 Secondary hyperparathyroidism of renal origin: Secondary | ICD-10-CM | POA: Diagnosis not present

## 2023-12-29 DIAGNOSIS — Z79899 Other long term (current) drug therapy: Secondary | ICD-10-CM | POA: Diagnosis not present

## 2023-12-29 DIAGNOSIS — D631 Anemia in chronic kidney disease: Secondary | ICD-10-CM | POA: Diagnosis not present

## 2023-12-29 DIAGNOSIS — R82998 Other abnormal findings in urine: Secondary | ICD-10-CM | POA: Diagnosis not present

## 2023-12-30 DIAGNOSIS — M25641 Stiffness of right hand, not elsewhere classified: Secondary | ICD-10-CM | POA: Diagnosis not present

## 2023-12-30 DIAGNOSIS — M79641 Pain in right hand: Secondary | ICD-10-CM | POA: Diagnosis not present

## 2023-12-31 DIAGNOSIS — D509 Iron deficiency anemia, unspecified: Secondary | ICD-10-CM | POA: Diagnosis not present

## 2023-12-31 DIAGNOSIS — N2581 Secondary hyperparathyroidism of renal origin: Secondary | ICD-10-CM | POA: Diagnosis not present

## 2023-12-31 DIAGNOSIS — Z992 Dependence on renal dialysis: Secondary | ICD-10-CM | POA: Diagnosis not present

## 2023-12-31 DIAGNOSIS — Z79899 Other long term (current) drug therapy: Secondary | ICD-10-CM | POA: Diagnosis not present

## 2023-12-31 DIAGNOSIS — D631 Anemia in chronic kidney disease: Secondary | ICD-10-CM | POA: Diagnosis not present

## 2023-12-31 DIAGNOSIS — N186 End stage renal disease: Secondary | ICD-10-CM | POA: Diagnosis not present

## 2023-12-31 DIAGNOSIS — N184 Chronic kidney disease, stage 4 (severe): Secondary | ICD-10-CM | POA: Diagnosis not present

## 2023-12-31 DIAGNOSIS — E440 Moderate protein-calorie malnutrition: Secondary | ICD-10-CM | POA: Diagnosis not present

## 2023-12-31 DIAGNOSIS — R82998 Other abnormal findings in urine: Secondary | ICD-10-CM | POA: Diagnosis not present

## 2023-12-31 DIAGNOSIS — R17 Unspecified jaundice: Secondary | ICD-10-CM | POA: Diagnosis not present

## 2024-01-01 DIAGNOSIS — I129 Hypertensive chronic kidney disease with stage 1 through stage 4 chronic kidney disease, or unspecified chronic kidney disease: Secondary | ICD-10-CM | POA: Diagnosis not present

## 2024-01-01 DIAGNOSIS — Z992 Dependence on renal dialysis: Secondary | ICD-10-CM | POA: Diagnosis not present

## 2024-01-01 DIAGNOSIS — N186 End stage renal disease: Secondary | ICD-10-CM | POA: Diagnosis not present

## 2024-01-03 DIAGNOSIS — E1122 Type 2 diabetes mellitus with diabetic chronic kidney disease: Secondary | ICD-10-CM | POA: Diagnosis not present

## 2024-01-03 DIAGNOSIS — K769 Liver disease, unspecified: Secondary | ICD-10-CM | POA: Diagnosis not present

## 2024-01-03 DIAGNOSIS — R82998 Other abnormal findings in urine: Secondary | ICD-10-CM | POA: Diagnosis not present

## 2024-01-03 DIAGNOSIS — N2581 Secondary hyperparathyroidism of renal origin: Secondary | ICD-10-CM | POA: Diagnosis not present

## 2024-01-03 DIAGNOSIS — N2589 Other disorders resulting from impaired renal tubular function: Secondary | ICD-10-CM | POA: Diagnosis not present

## 2024-01-03 DIAGNOSIS — N184 Chronic kidney disease, stage 4 (severe): Secondary | ICD-10-CM | POA: Diagnosis not present

## 2024-01-03 DIAGNOSIS — D509 Iron deficiency anemia, unspecified: Secondary | ICD-10-CM | POA: Diagnosis not present

## 2024-01-03 DIAGNOSIS — Z992 Dependence on renal dialysis: Secondary | ICD-10-CM | POA: Diagnosis not present

## 2024-01-03 DIAGNOSIS — N186 End stage renal disease: Secondary | ICD-10-CM | POA: Diagnosis not present

## 2024-01-03 DIAGNOSIS — D631 Anemia in chronic kidney disease: Secondary | ICD-10-CM | POA: Diagnosis not present

## 2024-01-04 DIAGNOSIS — E1122 Type 2 diabetes mellitus with diabetic chronic kidney disease: Secondary | ICD-10-CM | POA: Diagnosis not present

## 2024-01-04 DIAGNOSIS — N2589 Other disorders resulting from impaired renal tubular function: Secondary | ICD-10-CM | POA: Diagnosis not present

## 2024-01-04 DIAGNOSIS — K769 Liver disease, unspecified: Secondary | ICD-10-CM | POA: Diagnosis not present

## 2024-01-04 DIAGNOSIS — N2581 Secondary hyperparathyroidism of renal origin: Secondary | ICD-10-CM | POA: Diagnosis not present

## 2024-01-04 DIAGNOSIS — D509 Iron deficiency anemia, unspecified: Secondary | ICD-10-CM | POA: Diagnosis not present

## 2024-01-04 DIAGNOSIS — Z992 Dependence on renal dialysis: Secondary | ICD-10-CM | POA: Diagnosis not present

## 2024-01-04 DIAGNOSIS — N186 End stage renal disease: Secondary | ICD-10-CM | POA: Diagnosis not present

## 2024-01-04 DIAGNOSIS — N184 Chronic kidney disease, stage 4 (severe): Secondary | ICD-10-CM | POA: Diagnosis not present

## 2024-01-04 DIAGNOSIS — R82998 Other abnormal findings in urine: Secondary | ICD-10-CM | POA: Diagnosis not present

## 2024-01-04 DIAGNOSIS — D631 Anemia in chronic kidney disease: Secondary | ICD-10-CM | POA: Diagnosis not present

## 2024-01-05 DIAGNOSIS — N2581 Secondary hyperparathyroidism of renal origin: Secondary | ICD-10-CM | POA: Diagnosis not present

## 2024-01-05 DIAGNOSIS — Z992 Dependence on renal dialysis: Secondary | ICD-10-CM | POA: Diagnosis not present

## 2024-01-05 DIAGNOSIS — N184 Chronic kidney disease, stage 4 (severe): Secondary | ICD-10-CM | POA: Diagnosis not present

## 2024-01-05 DIAGNOSIS — K769 Liver disease, unspecified: Secondary | ICD-10-CM | POA: Diagnosis not present

## 2024-01-05 DIAGNOSIS — E1122 Type 2 diabetes mellitus with diabetic chronic kidney disease: Secondary | ICD-10-CM | POA: Diagnosis not present

## 2024-01-05 DIAGNOSIS — R82998 Other abnormal findings in urine: Secondary | ICD-10-CM | POA: Diagnosis not present

## 2024-01-05 DIAGNOSIS — D509 Iron deficiency anemia, unspecified: Secondary | ICD-10-CM | POA: Diagnosis not present

## 2024-01-05 DIAGNOSIS — N2589 Other disorders resulting from impaired renal tubular function: Secondary | ICD-10-CM | POA: Diagnosis not present

## 2024-01-05 DIAGNOSIS — N186 End stage renal disease: Secondary | ICD-10-CM | POA: Diagnosis not present

## 2024-01-05 DIAGNOSIS — D631 Anemia in chronic kidney disease: Secondary | ICD-10-CM | POA: Diagnosis not present

## 2024-01-07 DIAGNOSIS — Z992 Dependence on renal dialysis: Secondary | ICD-10-CM | POA: Diagnosis not present

## 2024-01-07 DIAGNOSIS — N2581 Secondary hyperparathyroidism of renal origin: Secondary | ICD-10-CM | POA: Diagnosis not present

## 2024-01-07 DIAGNOSIS — N184 Chronic kidney disease, stage 4 (severe): Secondary | ICD-10-CM | POA: Diagnosis not present

## 2024-01-07 DIAGNOSIS — N2589 Other disorders resulting from impaired renal tubular function: Secondary | ICD-10-CM | POA: Diagnosis not present

## 2024-01-07 DIAGNOSIS — D631 Anemia in chronic kidney disease: Secondary | ICD-10-CM | POA: Diagnosis not present

## 2024-01-07 DIAGNOSIS — K769 Liver disease, unspecified: Secondary | ICD-10-CM | POA: Diagnosis not present

## 2024-01-07 DIAGNOSIS — R82998 Other abnormal findings in urine: Secondary | ICD-10-CM | POA: Diagnosis not present

## 2024-01-07 DIAGNOSIS — E1122 Type 2 diabetes mellitus with diabetic chronic kidney disease: Secondary | ICD-10-CM | POA: Diagnosis not present

## 2024-01-07 DIAGNOSIS — D509 Iron deficiency anemia, unspecified: Secondary | ICD-10-CM | POA: Diagnosis not present

## 2024-01-07 DIAGNOSIS — N186 End stage renal disease: Secondary | ICD-10-CM | POA: Diagnosis not present

## 2024-01-10 DIAGNOSIS — K769 Liver disease, unspecified: Secondary | ICD-10-CM | POA: Diagnosis not present

## 2024-01-10 DIAGNOSIS — E1122 Type 2 diabetes mellitus with diabetic chronic kidney disease: Secondary | ICD-10-CM | POA: Diagnosis not present

## 2024-01-10 DIAGNOSIS — N184 Chronic kidney disease, stage 4 (severe): Secondary | ICD-10-CM | POA: Diagnosis not present

## 2024-01-10 DIAGNOSIS — N186 End stage renal disease: Secondary | ICD-10-CM | POA: Diagnosis not present

## 2024-01-10 DIAGNOSIS — D631 Anemia in chronic kidney disease: Secondary | ICD-10-CM | POA: Diagnosis not present

## 2024-01-10 DIAGNOSIS — N2581 Secondary hyperparathyroidism of renal origin: Secondary | ICD-10-CM | POA: Diagnosis not present

## 2024-01-10 DIAGNOSIS — R82998 Other abnormal findings in urine: Secondary | ICD-10-CM | POA: Diagnosis not present

## 2024-01-10 DIAGNOSIS — Z992 Dependence on renal dialysis: Secondary | ICD-10-CM | POA: Diagnosis not present

## 2024-01-10 DIAGNOSIS — N2589 Other disorders resulting from impaired renal tubular function: Secondary | ICD-10-CM | POA: Diagnosis not present

## 2024-01-10 DIAGNOSIS — D509 Iron deficiency anemia, unspecified: Secondary | ICD-10-CM | POA: Diagnosis not present

## 2024-01-12 DIAGNOSIS — Z992 Dependence on renal dialysis: Secondary | ICD-10-CM | POA: Diagnosis not present

## 2024-01-12 DIAGNOSIS — R82998 Other abnormal findings in urine: Secondary | ICD-10-CM | POA: Diagnosis not present

## 2024-01-12 DIAGNOSIS — K769 Liver disease, unspecified: Secondary | ICD-10-CM | POA: Diagnosis not present

## 2024-01-12 DIAGNOSIS — D631 Anemia in chronic kidney disease: Secondary | ICD-10-CM | POA: Diagnosis not present

## 2024-01-12 DIAGNOSIS — E1122 Type 2 diabetes mellitus with diabetic chronic kidney disease: Secondary | ICD-10-CM | POA: Diagnosis not present

## 2024-01-12 DIAGNOSIS — D509 Iron deficiency anemia, unspecified: Secondary | ICD-10-CM | POA: Diagnosis not present

## 2024-01-12 DIAGNOSIS — N2581 Secondary hyperparathyroidism of renal origin: Secondary | ICD-10-CM | POA: Diagnosis not present

## 2024-01-12 DIAGNOSIS — N2589 Other disorders resulting from impaired renal tubular function: Secondary | ICD-10-CM | POA: Diagnosis not present

## 2024-01-12 DIAGNOSIS — N184 Chronic kidney disease, stage 4 (severe): Secondary | ICD-10-CM | POA: Diagnosis not present

## 2024-01-12 DIAGNOSIS — N186 End stage renal disease: Secondary | ICD-10-CM | POA: Diagnosis not present

## 2024-01-14 DIAGNOSIS — N2581 Secondary hyperparathyroidism of renal origin: Secondary | ICD-10-CM | POA: Diagnosis not present

## 2024-01-14 DIAGNOSIS — D509 Iron deficiency anemia, unspecified: Secondary | ICD-10-CM | POA: Diagnosis not present

## 2024-01-14 DIAGNOSIS — N2589 Other disorders resulting from impaired renal tubular function: Secondary | ICD-10-CM | POA: Diagnosis not present

## 2024-01-14 DIAGNOSIS — N184 Chronic kidney disease, stage 4 (severe): Secondary | ICD-10-CM | POA: Diagnosis not present

## 2024-01-14 DIAGNOSIS — D631 Anemia in chronic kidney disease: Secondary | ICD-10-CM | POA: Diagnosis not present

## 2024-01-14 DIAGNOSIS — K769 Liver disease, unspecified: Secondary | ICD-10-CM | POA: Diagnosis not present

## 2024-01-14 DIAGNOSIS — E1122 Type 2 diabetes mellitus with diabetic chronic kidney disease: Secondary | ICD-10-CM | POA: Diagnosis not present

## 2024-01-14 DIAGNOSIS — Z992 Dependence on renal dialysis: Secondary | ICD-10-CM | POA: Diagnosis not present

## 2024-01-14 DIAGNOSIS — R82998 Other abnormal findings in urine: Secondary | ICD-10-CM | POA: Diagnosis not present

## 2024-01-14 DIAGNOSIS — N186 End stage renal disease: Secondary | ICD-10-CM | POA: Diagnosis not present

## 2024-01-17 DIAGNOSIS — K769 Liver disease, unspecified: Secondary | ICD-10-CM | POA: Diagnosis not present

## 2024-01-17 DIAGNOSIS — D631 Anemia in chronic kidney disease: Secondary | ICD-10-CM | POA: Diagnosis not present

## 2024-01-17 DIAGNOSIS — R82998 Other abnormal findings in urine: Secondary | ICD-10-CM | POA: Diagnosis not present

## 2024-01-17 DIAGNOSIS — N184 Chronic kidney disease, stage 4 (severe): Secondary | ICD-10-CM | POA: Diagnosis not present

## 2024-01-17 DIAGNOSIS — E1122 Type 2 diabetes mellitus with diabetic chronic kidney disease: Secondary | ICD-10-CM | POA: Diagnosis not present

## 2024-01-17 DIAGNOSIS — D509 Iron deficiency anemia, unspecified: Secondary | ICD-10-CM | POA: Diagnosis not present

## 2024-01-17 DIAGNOSIS — N186 End stage renal disease: Secondary | ICD-10-CM | POA: Diagnosis not present

## 2024-01-17 DIAGNOSIS — N2589 Other disorders resulting from impaired renal tubular function: Secondary | ICD-10-CM | POA: Diagnosis not present

## 2024-01-17 DIAGNOSIS — Z992 Dependence on renal dialysis: Secondary | ICD-10-CM | POA: Diagnosis not present

## 2024-01-17 DIAGNOSIS — N2581 Secondary hyperparathyroidism of renal origin: Secondary | ICD-10-CM | POA: Diagnosis not present

## 2024-01-18 DIAGNOSIS — R82998 Other abnormal findings in urine: Secondary | ICD-10-CM | POA: Diagnosis not present

## 2024-01-18 DIAGNOSIS — D509 Iron deficiency anemia, unspecified: Secondary | ICD-10-CM | POA: Diagnosis not present

## 2024-01-18 DIAGNOSIS — N184 Chronic kidney disease, stage 4 (severe): Secondary | ICD-10-CM | POA: Diagnosis not present

## 2024-01-18 DIAGNOSIS — E1122 Type 2 diabetes mellitus with diabetic chronic kidney disease: Secondary | ICD-10-CM | POA: Diagnosis not present

## 2024-01-18 DIAGNOSIS — N2581 Secondary hyperparathyroidism of renal origin: Secondary | ICD-10-CM | POA: Diagnosis not present

## 2024-01-18 DIAGNOSIS — K769 Liver disease, unspecified: Secondary | ICD-10-CM | POA: Diagnosis not present

## 2024-01-18 DIAGNOSIS — Z992 Dependence on renal dialysis: Secondary | ICD-10-CM | POA: Diagnosis not present

## 2024-01-18 DIAGNOSIS — D631 Anemia in chronic kidney disease: Secondary | ICD-10-CM | POA: Diagnosis not present

## 2024-01-18 DIAGNOSIS — N186 End stage renal disease: Secondary | ICD-10-CM | POA: Diagnosis not present

## 2024-01-18 DIAGNOSIS — N2589 Other disorders resulting from impaired renal tubular function: Secondary | ICD-10-CM | POA: Diagnosis not present

## 2024-01-19 DIAGNOSIS — E1122 Type 2 diabetes mellitus with diabetic chronic kidney disease: Secondary | ICD-10-CM | POA: Diagnosis not present

## 2024-01-19 DIAGNOSIS — N184 Chronic kidney disease, stage 4 (severe): Secondary | ICD-10-CM | POA: Diagnosis not present

## 2024-01-19 DIAGNOSIS — N186 End stage renal disease: Secondary | ICD-10-CM | POA: Diagnosis not present

## 2024-01-19 DIAGNOSIS — R82998 Other abnormal findings in urine: Secondary | ICD-10-CM | POA: Diagnosis not present

## 2024-01-19 DIAGNOSIS — D631 Anemia in chronic kidney disease: Secondary | ICD-10-CM | POA: Diagnosis not present

## 2024-01-19 DIAGNOSIS — N2581 Secondary hyperparathyroidism of renal origin: Secondary | ICD-10-CM | POA: Diagnosis not present

## 2024-01-19 DIAGNOSIS — Z992 Dependence on renal dialysis: Secondary | ICD-10-CM | POA: Diagnosis not present

## 2024-01-19 DIAGNOSIS — K769 Liver disease, unspecified: Secondary | ICD-10-CM | POA: Diagnosis not present

## 2024-01-19 DIAGNOSIS — N2589 Other disorders resulting from impaired renal tubular function: Secondary | ICD-10-CM | POA: Diagnosis not present

## 2024-01-19 DIAGNOSIS — D509 Iron deficiency anemia, unspecified: Secondary | ICD-10-CM | POA: Diagnosis not present

## 2024-01-21 DIAGNOSIS — N2589 Other disorders resulting from impaired renal tubular function: Secondary | ICD-10-CM | POA: Diagnosis not present

## 2024-01-21 DIAGNOSIS — N184 Chronic kidney disease, stage 4 (severe): Secondary | ICD-10-CM | POA: Diagnosis not present

## 2024-01-21 DIAGNOSIS — E1122 Type 2 diabetes mellitus with diabetic chronic kidney disease: Secondary | ICD-10-CM | POA: Diagnosis not present

## 2024-01-21 DIAGNOSIS — D509 Iron deficiency anemia, unspecified: Secondary | ICD-10-CM | POA: Diagnosis not present

## 2024-01-21 DIAGNOSIS — Z992 Dependence on renal dialysis: Secondary | ICD-10-CM | POA: Diagnosis not present

## 2024-01-21 DIAGNOSIS — K769 Liver disease, unspecified: Secondary | ICD-10-CM | POA: Diagnosis not present

## 2024-01-21 DIAGNOSIS — N2581 Secondary hyperparathyroidism of renal origin: Secondary | ICD-10-CM | POA: Diagnosis not present

## 2024-01-21 DIAGNOSIS — D631 Anemia in chronic kidney disease: Secondary | ICD-10-CM | POA: Diagnosis not present

## 2024-01-21 DIAGNOSIS — N186 End stage renal disease: Secondary | ICD-10-CM | POA: Diagnosis not present

## 2024-01-21 DIAGNOSIS — R82998 Other abnormal findings in urine: Secondary | ICD-10-CM | POA: Diagnosis not present

## 2024-01-24 DIAGNOSIS — N2581 Secondary hyperparathyroidism of renal origin: Secondary | ICD-10-CM | POA: Diagnosis not present

## 2024-01-24 DIAGNOSIS — E1122 Type 2 diabetes mellitus with diabetic chronic kidney disease: Secondary | ICD-10-CM | POA: Diagnosis not present

## 2024-01-24 DIAGNOSIS — Z992 Dependence on renal dialysis: Secondary | ICD-10-CM | POA: Diagnosis not present

## 2024-01-24 DIAGNOSIS — D631 Anemia in chronic kidney disease: Secondary | ICD-10-CM | POA: Diagnosis not present

## 2024-01-24 DIAGNOSIS — R82998 Other abnormal findings in urine: Secondary | ICD-10-CM | POA: Diagnosis not present

## 2024-01-24 DIAGNOSIS — N186 End stage renal disease: Secondary | ICD-10-CM | POA: Diagnosis not present

## 2024-01-24 DIAGNOSIS — D509 Iron deficiency anemia, unspecified: Secondary | ICD-10-CM | POA: Diagnosis not present

## 2024-01-24 DIAGNOSIS — N184 Chronic kidney disease, stage 4 (severe): Secondary | ICD-10-CM | POA: Diagnosis not present

## 2024-01-24 DIAGNOSIS — N2589 Other disorders resulting from impaired renal tubular function: Secondary | ICD-10-CM | POA: Diagnosis not present

## 2024-01-24 DIAGNOSIS — K769 Liver disease, unspecified: Secondary | ICD-10-CM | POA: Diagnosis not present

## 2024-01-26 DIAGNOSIS — K769 Liver disease, unspecified: Secondary | ICD-10-CM | POA: Diagnosis not present

## 2024-01-26 DIAGNOSIS — D509 Iron deficiency anemia, unspecified: Secondary | ICD-10-CM | POA: Diagnosis not present

## 2024-01-26 DIAGNOSIS — N2589 Other disorders resulting from impaired renal tubular function: Secondary | ICD-10-CM | POA: Diagnosis not present

## 2024-01-26 DIAGNOSIS — R82998 Other abnormal findings in urine: Secondary | ICD-10-CM | POA: Diagnosis not present

## 2024-01-26 DIAGNOSIS — N186 End stage renal disease: Secondary | ICD-10-CM | POA: Diagnosis not present

## 2024-01-26 DIAGNOSIS — D631 Anemia in chronic kidney disease: Secondary | ICD-10-CM | POA: Diagnosis not present

## 2024-01-26 DIAGNOSIS — Z992 Dependence on renal dialysis: Secondary | ICD-10-CM | POA: Diagnosis not present

## 2024-01-26 DIAGNOSIS — E1122 Type 2 diabetes mellitus with diabetic chronic kidney disease: Secondary | ICD-10-CM | POA: Diagnosis not present

## 2024-01-26 DIAGNOSIS — N184 Chronic kidney disease, stage 4 (severe): Secondary | ICD-10-CM | POA: Diagnosis not present

## 2024-01-26 DIAGNOSIS — N2581 Secondary hyperparathyroidism of renal origin: Secondary | ICD-10-CM | POA: Diagnosis not present

## 2024-01-28 DIAGNOSIS — N184 Chronic kidney disease, stage 4 (severe): Secondary | ICD-10-CM | POA: Diagnosis not present

## 2024-01-28 DIAGNOSIS — N186 End stage renal disease: Secondary | ICD-10-CM | POA: Diagnosis not present

## 2024-01-28 DIAGNOSIS — E1122 Type 2 diabetes mellitus with diabetic chronic kidney disease: Secondary | ICD-10-CM | POA: Diagnosis not present

## 2024-01-28 DIAGNOSIS — D509 Iron deficiency anemia, unspecified: Secondary | ICD-10-CM | POA: Diagnosis not present

## 2024-01-28 DIAGNOSIS — K769 Liver disease, unspecified: Secondary | ICD-10-CM | POA: Diagnosis not present

## 2024-01-28 DIAGNOSIS — N2581 Secondary hyperparathyroidism of renal origin: Secondary | ICD-10-CM | POA: Diagnosis not present

## 2024-01-28 DIAGNOSIS — N2589 Other disorders resulting from impaired renal tubular function: Secondary | ICD-10-CM | POA: Diagnosis not present

## 2024-01-28 DIAGNOSIS — R82998 Other abnormal findings in urine: Secondary | ICD-10-CM | POA: Diagnosis not present

## 2024-01-28 DIAGNOSIS — D631 Anemia in chronic kidney disease: Secondary | ICD-10-CM | POA: Diagnosis not present

## 2024-01-28 DIAGNOSIS — Z992 Dependence on renal dialysis: Secondary | ICD-10-CM | POA: Diagnosis not present

## 2024-01-31 DIAGNOSIS — N184 Chronic kidney disease, stage 4 (severe): Secondary | ICD-10-CM | POA: Diagnosis not present

## 2024-01-31 DIAGNOSIS — D509 Iron deficiency anemia, unspecified: Secondary | ICD-10-CM | POA: Diagnosis not present

## 2024-01-31 DIAGNOSIS — D631 Anemia in chronic kidney disease: Secondary | ICD-10-CM | POA: Diagnosis not present

## 2024-01-31 DIAGNOSIS — R82998 Other abnormal findings in urine: Secondary | ICD-10-CM | POA: Diagnosis not present

## 2024-01-31 DIAGNOSIS — I129 Hypertensive chronic kidney disease with stage 1 through stage 4 chronic kidney disease, or unspecified chronic kidney disease: Secondary | ICD-10-CM | POA: Diagnosis not present

## 2024-01-31 DIAGNOSIS — E1122 Type 2 diabetes mellitus with diabetic chronic kidney disease: Secondary | ICD-10-CM | POA: Diagnosis not present

## 2024-01-31 DIAGNOSIS — K769 Liver disease, unspecified: Secondary | ICD-10-CM | POA: Diagnosis not present

## 2024-01-31 DIAGNOSIS — N186 End stage renal disease: Secondary | ICD-10-CM | POA: Diagnosis not present

## 2024-01-31 DIAGNOSIS — N2581 Secondary hyperparathyroidism of renal origin: Secondary | ICD-10-CM | POA: Diagnosis not present

## 2024-01-31 DIAGNOSIS — N2589 Other disorders resulting from impaired renal tubular function: Secondary | ICD-10-CM | POA: Diagnosis not present

## 2024-01-31 DIAGNOSIS — Z992 Dependence on renal dialysis: Secondary | ICD-10-CM | POA: Diagnosis not present

## 2024-02-02 DIAGNOSIS — R17 Unspecified jaundice: Secondary | ICD-10-CM | POA: Diagnosis not present

## 2024-02-02 DIAGNOSIS — D631 Anemia in chronic kidney disease: Secondary | ICD-10-CM | POA: Diagnosis not present

## 2024-02-02 DIAGNOSIS — N186 End stage renal disease: Secondary | ICD-10-CM | POA: Diagnosis not present

## 2024-02-02 DIAGNOSIS — D509 Iron deficiency anemia, unspecified: Secondary | ICD-10-CM | POA: Diagnosis not present

## 2024-02-02 DIAGNOSIS — Z992 Dependence on renal dialysis: Secondary | ICD-10-CM | POA: Diagnosis not present

## 2024-02-02 DIAGNOSIS — N2581 Secondary hyperparathyroidism of renal origin: Secondary | ICD-10-CM | POA: Diagnosis not present

## 2024-02-02 DIAGNOSIS — Z79899 Other long term (current) drug therapy: Secondary | ICD-10-CM | POA: Diagnosis not present

## 2024-02-02 DIAGNOSIS — N184 Chronic kidney disease, stage 4 (severe): Secondary | ICD-10-CM | POA: Diagnosis not present

## 2024-02-02 DIAGNOSIS — E440 Moderate protein-calorie malnutrition: Secondary | ICD-10-CM | POA: Diagnosis not present

## 2024-02-02 DIAGNOSIS — R82998 Other abnormal findings in urine: Secondary | ICD-10-CM | POA: Diagnosis not present

## 2024-02-03 DIAGNOSIS — D509 Iron deficiency anemia, unspecified: Secondary | ICD-10-CM | POA: Diagnosis not present

## 2024-02-03 DIAGNOSIS — D631 Anemia in chronic kidney disease: Secondary | ICD-10-CM | POA: Diagnosis not present

## 2024-02-03 DIAGNOSIS — R82998 Other abnormal findings in urine: Secondary | ICD-10-CM | POA: Diagnosis not present

## 2024-02-03 DIAGNOSIS — R17 Unspecified jaundice: Secondary | ICD-10-CM | POA: Diagnosis not present

## 2024-02-03 DIAGNOSIS — N186 End stage renal disease: Secondary | ICD-10-CM | POA: Diagnosis not present

## 2024-02-03 DIAGNOSIS — Z79899 Other long term (current) drug therapy: Secondary | ICD-10-CM | POA: Diagnosis not present

## 2024-02-03 DIAGNOSIS — Z992 Dependence on renal dialysis: Secondary | ICD-10-CM | POA: Diagnosis not present

## 2024-02-03 DIAGNOSIS — N2581 Secondary hyperparathyroidism of renal origin: Secondary | ICD-10-CM | POA: Diagnosis not present

## 2024-02-03 DIAGNOSIS — N184 Chronic kidney disease, stage 4 (severe): Secondary | ICD-10-CM | POA: Diagnosis not present

## 2024-02-03 DIAGNOSIS — E440 Moderate protein-calorie malnutrition: Secondary | ICD-10-CM | POA: Diagnosis not present

## 2024-02-04 DIAGNOSIS — Z992 Dependence on renal dialysis: Secondary | ICD-10-CM | POA: Diagnosis not present

## 2024-02-04 DIAGNOSIS — E440 Moderate protein-calorie malnutrition: Secondary | ICD-10-CM | POA: Diagnosis not present

## 2024-02-04 DIAGNOSIS — R82998 Other abnormal findings in urine: Secondary | ICD-10-CM | POA: Diagnosis not present

## 2024-02-04 DIAGNOSIS — N186 End stage renal disease: Secondary | ICD-10-CM | POA: Diagnosis not present

## 2024-02-04 DIAGNOSIS — N2581 Secondary hyperparathyroidism of renal origin: Secondary | ICD-10-CM | POA: Diagnosis not present

## 2024-02-04 DIAGNOSIS — D509 Iron deficiency anemia, unspecified: Secondary | ICD-10-CM | POA: Diagnosis not present

## 2024-02-04 DIAGNOSIS — N184 Chronic kidney disease, stage 4 (severe): Secondary | ICD-10-CM | POA: Diagnosis not present

## 2024-02-04 DIAGNOSIS — D631 Anemia in chronic kidney disease: Secondary | ICD-10-CM | POA: Diagnosis not present

## 2024-02-04 DIAGNOSIS — R17 Unspecified jaundice: Secondary | ICD-10-CM | POA: Diagnosis not present

## 2024-02-04 DIAGNOSIS — Z79899 Other long term (current) drug therapy: Secondary | ICD-10-CM | POA: Diagnosis not present

## 2024-02-07 DIAGNOSIS — Z79899 Other long term (current) drug therapy: Secondary | ICD-10-CM | POA: Diagnosis not present

## 2024-02-07 DIAGNOSIS — R82998 Other abnormal findings in urine: Secondary | ICD-10-CM | POA: Diagnosis not present

## 2024-02-07 DIAGNOSIS — E440 Moderate protein-calorie malnutrition: Secondary | ICD-10-CM | POA: Diagnosis not present

## 2024-02-07 DIAGNOSIS — Z992 Dependence on renal dialysis: Secondary | ICD-10-CM | POA: Diagnosis not present

## 2024-02-07 DIAGNOSIS — R17 Unspecified jaundice: Secondary | ICD-10-CM | POA: Diagnosis not present

## 2024-02-07 DIAGNOSIS — N186 End stage renal disease: Secondary | ICD-10-CM | POA: Diagnosis not present

## 2024-02-07 DIAGNOSIS — N184 Chronic kidney disease, stage 4 (severe): Secondary | ICD-10-CM | POA: Diagnosis not present

## 2024-02-07 DIAGNOSIS — D631 Anemia in chronic kidney disease: Secondary | ICD-10-CM | POA: Diagnosis not present

## 2024-02-07 DIAGNOSIS — D509 Iron deficiency anemia, unspecified: Secondary | ICD-10-CM | POA: Diagnosis not present

## 2024-02-07 DIAGNOSIS — N2581 Secondary hyperparathyroidism of renal origin: Secondary | ICD-10-CM | POA: Diagnosis not present

## 2024-02-08 DIAGNOSIS — N2581 Secondary hyperparathyroidism of renal origin: Secondary | ICD-10-CM | POA: Diagnosis not present

## 2024-02-08 DIAGNOSIS — R82998 Other abnormal findings in urine: Secondary | ICD-10-CM | POA: Diagnosis not present

## 2024-02-08 DIAGNOSIS — E440 Moderate protein-calorie malnutrition: Secondary | ICD-10-CM | POA: Diagnosis not present

## 2024-02-08 DIAGNOSIS — D509 Iron deficiency anemia, unspecified: Secondary | ICD-10-CM | POA: Diagnosis not present

## 2024-02-08 DIAGNOSIS — N184 Chronic kidney disease, stage 4 (severe): Secondary | ICD-10-CM | POA: Diagnosis not present

## 2024-02-08 DIAGNOSIS — Z992 Dependence on renal dialysis: Secondary | ICD-10-CM | POA: Diagnosis not present

## 2024-02-08 DIAGNOSIS — Z79899 Other long term (current) drug therapy: Secondary | ICD-10-CM | POA: Diagnosis not present

## 2024-02-08 DIAGNOSIS — D631 Anemia in chronic kidney disease: Secondary | ICD-10-CM | POA: Diagnosis not present

## 2024-02-08 DIAGNOSIS — N186 End stage renal disease: Secondary | ICD-10-CM | POA: Diagnosis not present

## 2024-02-08 DIAGNOSIS — R17 Unspecified jaundice: Secondary | ICD-10-CM | POA: Diagnosis not present

## 2024-02-11 DIAGNOSIS — E440 Moderate protein-calorie malnutrition: Secondary | ICD-10-CM | POA: Diagnosis not present

## 2024-02-11 DIAGNOSIS — N184 Chronic kidney disease, stage 4 (severe): Secondary | ICD-10-CM | POA: Diagnosis not present

## 2024-02-11 DIAGNOSIS — R17 Unspecified jaundice: Secondary | ICD-10-CM | POA: Diagnosis not present

## 2024-02-11 DIAGNOSIS — N186 End stage renal disease: Secondary | ICD-10-CM | POA: Diagnosis not present

## 2024-02-11 DIAGNOSIS — Z992 Dependence on renal dialysis: Secondary | ICD-10-CM | POA: Diagnosis not present

## 2024-02-11 DIAGNOSIS — D509 Iron deficiency anemia, unspecified: Secondary | ICD-10-CM | POA: Diagnosis not present

## 2024-02-11 DIAGNOSIS — Z79899 Other long term (current) drug therapy: Secondary | ICD-10-CM | POA: Diagnosis not present

## 2024-02-11 DIAGNOSIS — R82998 Other abnormal findings in urine: Secondary | ICD-10-CM | POA: Diagnosis not present

## 2024-02-11 DIAGNOSIS — N2581 Secondary hyperparathyroidism of renal origin: Secondary | ICD-10-CM | POA: Diagnosis not present

## 2024-02-11 DIAGNOSIS — D631 Anemia in chronic kidney disease: Secondary | ICD-10-CM | POA: Diagnosis not present

## 2024-02-14 DIAGNOSIS — D509 Iron deficiency anemia, unspecified: Secondary | ICD-10-CM | POA: Diagnosis not present

## 2024-02-14 DIAGNOSIS — N184 Chronic kidney disease, stage 4 (severe): Secondary | ICD-10-CM | POA: Diagnosis not present

## 2024-02-14 DIAGNOSIS — Z79899 Other long term (current) drug therapy: Secondary | ICD-10-CM | POA: Diagnosis not present

## 2024-02-14 DIAGNOSIS — R17 Unspecified jaundice: Secondary | ICD-10-CM | POA: Diagnosis not present

## 2024-02-14 DIAGNOSIS — Z992 Dependence on renal dialysis: Secondary | ICD-10-CM | POA: Diagnosis not present

## 2024-02-14 DIAGNOSIS — N186 End stage renal disease: Secondary | ICD-10-CM | POA: Diagnosis not present

## 2024-02-14 DIAGNOSIS — N2581 Secondary hyperparathyroidism of renal origin: Secondary | ICD-10-CM | POA: Diagnosis not present

## 2024-02-14 DIAGNOSIS — D631 Anemia in chronic kidney disease: Secondary | ICD-10-CM | POA: Diagnosis not present

## 2024-02-14 DIAGNOSIS — E440 Moderate protein-calorie malnutrition: Secondary | ICD-10-CM | POA: Diagnosis not present

## 2024-02-14 DIAGNOSIS — R82998 Other abnormal findings in urine: Secondary | ICD-10-CM | POA: Diagnosis not present

## 2024-02-17 DIAGNOSIS — R17 Unspecified jaundice: Secondary | ICD-10-CM | POA: Diagnosis not present

## 2024-02-17 DIAGNOSIS — N186 End stage renal disease: Secondary | ICD-10-CM | POA: Diagnosis not present

## 2024-02-17 DIAGNOSIS — D509 Iron deficiency anemia, unspecified: Secondary | ICD-10-CM | POA: Diagnosis not present

## 2024-02-17 DIAGNOSIS — Z992 Dependence on renal dialysis: Secondary | ICD-10-CM | POA: Diagnosis not present

## 2024-02-17 DIAGNOSIS — R82998 Other abnormal findings in urine: Secondary | ICD-10-CM | POA: Diagnosis not present

## 2024-02-17 DIAGNOSIS — Z79899 Other long term (current) drug therapy: Secondary | ICD-10-CM | POA: Diagnosis not present

## 2024-02-17 DIAGNOSIS — N2581 Secondary hyperparathyroidism of renal origin: Secondary | ICD-10-CM | POA: Diagnosis not present

## 2024-02-17 DIAGNOSIS — D631 Anemia in chronic kidney disease: Secondary | ICD-10-CM | POA: Diagnosis not present

## 2024-02-17 DIAGNOSIS — N184 Chronic kidney disease, stage 4 (severe): Secondary | ICD-10-CM | POA: Diagnosis not present

## 2024-02-17 DIAGNOSIS — E440 Moderate protein-calorie malnutrition: Secondary | ICD-10-CM | POA: Diagnosis not present

## 2024-02-19 DIAGNOSIS — D509 Iron deficiency anemia, unspecified: Secondary | ICD-10-CM | POA: Diagnosis not present

## 2024-02-19 DIAGNOSIS — R82998 Other abnormal findings in urine: Secondary | ICD-10-CM | POA: Diagnosis not present

## 2024-02-19 DIAGNOSIS — E440 Moderate protein-calorie malnutrition: Secondary | ICD-10-CM | POA: Diagnosis not present

## 2024-02-19 DIAGNOSIS — R17 Unspecified jaundice: Secondary | ICD-10-CM | POA: Diagnosis not present

## 2024-02-19 DIAGNOSIS — N2581 Secondary hyperparathyroidism of renal origin: Secondary | ICD-10-CM | POA: Diagnosis not present

## 2024-02-19 DIAGNOSIS — N184 Chronic kidney disease, stage 4 (severe): Secondary | ICD-10-CM | POA: Diagnosis not present

## 2024-02-19 DIAGNOSIS — D631 Anemia in chronic kidney disease: Secondary | ICD-10-CM | POA: Diagnosis not present

## 2024-02-19 DIAGNOSIS — Z992 Dependence on renal dialysis: Secondary | ICD-10-CM | POA: Diagnosis not present

## 2024-02-19 DIAGNOSIS — Z79899 Other long term (current) drug therapy: Secondary | ICD-10-CM | POA: Diagnosis not present

## 2024-02-19 DIAGNOSIS — N186 End stage renal disease: Secondary | ICD-10-CM | POA: Diagnosis not present

## 2024-02-21 DIAGNOSIS — N184 Chronic kidney disease, stage 4 (severe): Secondary | ICD-10-CM | POA: Diagnosis not present

## 2024-02-21 DIAGNOSIS — Z992 Dependence on renal dialysis: Secondary | ICD-10-CM | POA: Diagnosis not present

## 2024-02-21 DIAGNOSIS — N186 End stage renal disease: Secondary | ICD-10-CM | POA: Diagnosis not present

## 2024-02-21 DIAGNOSIS — D631 Anemia in chronic kidney disease: Secondary | ICD-10-CM | POA: Diagnosis not present

## 2024-02-21 DIAGNOSIS — D509 Iron deficiency anemia, unspecified: Secondary | ICD-10-CM | POA: Diagnosis not present

## 2024-02-21 DIAGNOSIS — R82998 Other abnormal findings in urine: Secondary | ICD-10-CM | POA: Diagnosis not present

## 2024-02-21 DIAGNOSIS — R17 Unspecified jaundice: Secondary | ICD-10-CM | POA: Diagnosis not present

## 2024-02-21 DIAGNOSIS — Z79899 Other long term (current) drug therapy: Secondary | ICD-10-CM | POA: Diagnosis not present

## 2024-02-21 DIAGNOSIS — E440 Moderate protein-calorie malnutrition: Secondary | ICD-10-CM | POA: Diagnosis not present

## 2024-02-21 DIAGNOSIS — N2581 Secondary hyperparathyroidism of renal origin: Secondary | ICD-10-CM | POA: Diagnosis not present

## 2024-02-23 DIAGNOSIS — D631 Anemia in chronic kidney disease: Secondary | ICD-10-CM | POA: Diagnosis not present

## 2024-02-23 DIAGNOSIS — Z992 Dependence on renal dialysis: Secondary | ICD-10-CM | POA: Diagnosis not present

## 2024-02-23 DIAGNOSIS — N2581 Secondary hyperparathyroidism of renal origin: Secondary | ICD-10-CM | POA: Diagnosis not present

## 2024-02-23 DIAGNOSIS — E440 Moderate protein-calorie malnutrition: Secondary | ICD-10-CM | POA: Diagnosis not present

## 2024-02-23 DIAGNOSIS — N184 Chronic kidney disease, stage 4 (severe): Secondary | ICD-10-CM | POA: Diagnosis not present

## 2024-02-23 DIAGNOSIS — N186 End stage renal disease: Secondary | ICD-10-CM | POA: Diagnosis not present

## 2024-02-23 DIAGNOSIS — R17 Unspecified jaundice: Secondary | ICD-10-CM | POA: Diagnosis not present

## 2024-02-23 DIAGNOSIS — D509 Iron deficiency anemia, unspecified: Secondary | ICD-10-CM | POA: Diagnosis not present

## 2024-02-23 DIAGNOSIS — Z79899 Other long term (current) drug therapy: Secondary | ICD-10-CM | POA: Diagnosis not present

## 2024-02-23 DIAGNOSIS — R82998 Other abnormal findings in urine: Secondary | ICD-10-CM | POA: Diagnosis not present

## 2024-02-24 DIAGNOSIS — D509 Iron deficiency anemia, unspecified: Secondary | ICD-10-CM | POA: Diagnosis not present

## 2024-02-24 DIAGNOSIS — N2581 Secondary hyperparathyroidism of renal origin: Secondary | ICD-10-CM | POA: Diagnosis not present

## 2024-02-24 DIAGNOSIS — Z79899 Other long term (current) drug therapy: Secondary | ICD-10-CM | POA: Diagnosis not present

## 2024-02-24 DIAGNOSIS — N184 Chronic kidney disease, stage 4 (severe): Secondary | ICD-10-CM | POA: Diagnosis not present

## 2024-02-24 DIAGNOSIS — R17 Unspecified jaundice: Secondary | ICD-10-CM | POA: Diagnosis not present

## 2024-02-24 DIAGNOSIS — R82998 Other abnormal findings in urine: Secondary | ICD-10-CM | POA: Diagnosis not present

## 2024-02-24 DIAGNOSIS — E440 Moderate protein-calorie malnutrition: Secondary | ICD-10-CM | POA: Diagnosis not present

## 2024-02-24 DIAGNOSIS — N186 End stage renal disease: Secondary | ICD-10-CM | POA: Diagnosis not present

## 2024-02-24 DIAGNOSIS — D631 Anemia in chronic kidney disease: Secondary | ICD-10-CM | POA: Diagnosis not present

## 2024-02-24 DIAGNOSIS — Z992 Dependence on renal dialysis: Secondary | ICD-10-CM | POA: Diagnosis not present

## 2024-02-28 DIAGNOSIS — Z79899 Other long term (current) drug therapy: Secondary | ICD-10-CM | POA: Diagnosis not present

## 2024-02-28 DIAGNOSIS — R82998 Other abnormal findings in urine: Secondary | ICD-10-CM | POA: Diagnosis not present

## 2024-02-28 DIAGNOSIS — N184 Chronic kidney disease, stage 4 (severe): Secondary | ICD-10-CM | POA: Diagnosis not present

## 2024-02-28 DIAGNOSIS — N186 End stage renal disease: Secondary | ICD-10-CM | POA: Diagnosis not present

## 2024-02-28 DIAGNOSIS — E440 Moderate protein-calorie malnutrition: Secondary | ICD-10-CM | POA: Diagnosis not present

## 2024-02-28 DIAGNOSIS — N2581 Secondary hyperparathyroidism of renal origin: Secondary | ICD-10-CM | POA: Diagnosis not present

## 2024-02-28 DIAGNOSIS — D631 Anemia in chronic kidney disease: Secondary | ICD-10-CM | POA: Diagnosis not present

## 2024-02-28 DIAGNOSIS — D509 Iron deficiency anemia, unspecified: Secondary | ICD-10-CM | POA: Diagnosis not present

## 2024-02-28 DIAGNOSIS — Z992 Dependence on renal dialysis: Secondary | ICD-10-CM | POA: Diagnosis not present

## 2024-02-28 DIAGNOSIS — R17 Unspecified jaundice: Secondary | ICD-10-CM | POA: Diagnosis not present

## 2024-03-01 DIAGNOSIS — Z992 Dependence on renal dialysis: Secondary | ICD-10-CM | POA: Diagnosis not present

## 2024-03-01 DIAGNOSIS — R82998 Other abnormal findings in urine: Secondary | ICD-10-CM | POA: Diagnosis not present

## 2024-03-01 DIAGNOSIS — E440 Moderate protein-calorie malnutrition: Secondary | ICD-10-CM | POA: Diagnosis not present

## 2024-03-01 DIAGNOSIS — N184 Chronic kidney disease, stage 4 (severe): Secondary | ICD-10-CM | POA: Diagnosis not present

## 2024-03-01 DIAGNOSIS — N2581 Secondary hyperparathyroidism of renal origin: Secondary | ICD-10-CM | POA: Diagnosis not present

## 2024-03-01 DIAGNOSIS — N186 End stage renal disease: Secondary | ICD-10-CM | POA: Diagnosis not present

## 2024-03-01 DIAGNOSIS — R17 Unspecified jaundice: Secondary | ICD-10-CM | POA: Diagnosis not present

## 2024-03-01 DIAGNOSIS — D509 Iron deficiency anemia, unspecified: Secondary | ICD-10-CM | POA: Diagnosis not present

## 2024-03-01 DIAGNOSIS — D631 Anemia in chronic kidney disease: Secondary | ICD-10-CM | POA: Diagnosis not present

## 2024-03-01 DIAGNOSIS — Z79899 Other long term (current) drug therapy: Secondary | ICD-10-CM | POA: Diagnosis not present

## 2024-03-02 DIAGNOSIS — Z992 Dependence on renal dialysis: Secondary | ICD-10-CM | POA: Diagnosis not present

## 2024-03-02 DIAGNOSIS — N186 End stage renal disease: Secondary | ICD-10-CM | POA: Diagnosis not present

## 2024-03-02 DIAGNOSIS — I129 Hypertensive chronic kidney disease with stage 1 through stage 4 chronic kidney disease, or unspecified chronic kidney disease: Secondary | ICD-10-CM | POA: Diagnosis not present

## 2024-03-03 DIAGNOSIS — R17 Unspecified jaundice: Secondary | ICD-10-CM | POA: Diagnosis not present

## 2024-03-03 DIAGNOSIS — E440 Moderate protein-calorie malnutrition: Secondary | ICD-10-CM | POA: Diagnosis not present

## 2024-03-03 DIAGNOSIS — N186 End stage renal disease: Secondary | ICD-10-CM | POA: Diagnosis not present

## 2024-03-03 DIAGNOSIS — N184 Chronic kidney disease, stage 4 (severe): Secondary | ICD-10-CM | POA: Diagnosis not present

## 2024-03-03 DIAGNOSIS — Z79899 Other long term (current) drug therapy: Secondary | ICD-10-CM | POA: Diagnosis not present

## 2024-03-03 DIAGNOSIS — D509 Iron deficiency anemia, unspecified: Secondary | ICD-10-CM | POA: Diagnosis not present

## 2024-03-03 DIAGNOSIS — Z992 Dependence on renal dialysis: Secondary | ICD-10-CM | POA: Diagnosis not present

## 2024-03-03 DIAGNOSIS — N2581 Secondary hyperparathyroidism of renal origin: Secondary | ICD-10-CM | POA: Diagnosis not present

## 2024-03-03 DIAGNOSIS — D631 Anemia in chronic kidney disease: Secondary | ICD-10-CM | POA: Diagnosis not present

## 2024-03-03 DIAGNOSIS — R82998 Other abnormal findings in urine: Secondary | ICD-10-CM | POA: Diagnosis not present

## 2024-03-06 DIAGNOSIS — D631 Anemia in chronic kidney disease: Secondary | ICD-10-CM | POA: Diagnosis not present

## 2024-03-06 DIAGNOSIS — E440 Moderate protein-calorie malnutrition: Secondary | ICD-10-CM | POA: Diagnosis not present

## 2024-03-06 DIAGNOSIS — R82998 Other abnormal findings in urine: Secondary | ICD-10-CM | POA: Diagnosis not present

## 2024-03-06 DIAGNOSIS — Z992 Dependence on renal dialysis: Secondary | ICD-10-CM | POA: Diagnosis not present

## 2024-03-06 DIAGNOSIS — R17 Unspecified jaundice: Secondary | ICD-10-CM | POA: Diagnosis not present

## 2024-03-06 DIAGNOSIS — Z79899 Other long term (current) drug therapy: Secondary | ICD-10-CM | POA: Diagnosis not present

## 2024-03-06 DIAGNOSIS — N184 Chronic kidney disease, stage 4 (severe): Secondary | ICD-10-CM | POA: Diagnosis not present

## 2024-03-06 DIAGNOSIS — D509 Iron deficiency anemia, unspecified: Secondary | ICD-10-CM | POA: Diagnosis not present

## 2024-03-06 DIAGNOSIS — N186 End stage renal disease: Secondary | ICD-10-CM | POA: Diagnosis not present

## 2024-03-06 DIAGNOSIS — N2581 Secondary hyperparathyroidism of renal origin: Secondary | ICD-10-CM | POA: Diagnosis not present

## 2024-03-08 ENCOUNTER — Ambulatory Visit: Payer: Medicare Other

## 2024-03-08 DIAGNOSIS — Z992 Dependence on renal dialysis: Secondary | ICD-10-CM | POA: Diagnosis not present

## 2024-03-08 DIAGNOSIS — N2581 Secondary hyperparathyroidism of renal origin: Secondary | ICD-10-CM | POA: Diagnosis not present

## 2024-03-08 DIAGNOSIS — R17 Unspecified jaundice: Secondary | ICD-10-CM | POA: Diagnosis not present

## 2024-03-08 DIAGNOSIS — N184 Chronic kidney disease, stage 4 (severe): Secondary | ICD-10-CM | POA: Diagnosis not present

## 2024-03-08 DIAGNOSIS — Z0000 Encounter for general adult medical examination without abnormal findings: Secondary | ICD-10-CM | POA: Diagnosis not present

## 2024-03-08 DIAGNOSIS — E440 Moderate protein-calorie malnutrition: Secondary | ICD-10-CM | POA: Diagnosis not present

## 2024-03-08 DIAGNOSIS — Z79899 Other long term (current) drug therapy: Secondary | ICD-10-CM | POA: Diagnosis not present

## 2024-03-08 DIAGNOSIS — D509 Iron deficiency anemia, unspecified: Secondary | ICD-10-CM | POA: Diagnosis not present

## 2024-03-08 DIAGNOSIS — D631 Anemia in chronic kidney disease: Secondary | ICD-10-CM | POA: Diagnosis not present

## 2024-03-08 DIAGNOSIS — R82998 Other abnormal findings in urine: Secondary | ICD-10-CM | POA: Diagnosis not present

## 2024-03-08 DIAGNOSIS — N186 End stage renal disease: Secondary | ICD-10-CM | POA: Diagnosis not present

## 2024-03-08 NOTE — Patient Instructions (Addendum)
 Jeremy Dorsey , Thank you for taking time out of your busy schedule to complete your Annual Wellness Visit with me. I enjoyed our conversation and look forward to speaking with you again next year. I, as well as your care team,  appreciate your ongoing commitment to your health goals. Please review the following plan we discussed and let me know if I can assist you in the future.   Follow up Visits: 03/09/25 @ 11:30 AM BY PHONE We will see or speak with you next year for your Next Medicare AWV with our clinical staff Have you seen your provider in the last 6 months (3 months if uncontrolled diabetes)? Yes  Clinician Recommendations:  Aim for 30 minutes of exercise or brisk walking, 6-8 glasses of water, and 5 servings of fruits and vegetables each day. TAKE CARE!      This is a list of the screenings recommended for you:  Health Maintenance  Topic Date Due   DTaP/Tdap/Td vaccine (1 - Tdap) Never done   Zoster (Shingles) Vaccine (1 of 2) 03/30/1970   Complete foot exam   05/17/2021   Colon Cancer Screening  06/08/2021   Hemoglobin A1C  11/18/2021   Eye exam for diabetics  01/02/2022   COVID-19 Vaccine (5 - 2024-25 season) 04/04/2023   Flu Shot  03/03/2024   Medicare Annual Wellness Visit  03/08/2025   Pneumococcal Vaccine for age over 30  Completed   Hepatitis B Vaccine  Completed   Hepatitis C Screening  Completed   HPV Vaccine  Aged Out   Meningitis B Vaccine  Aged Out    Advanced directives: (ACP Link)Information on Advanced Care Planning can be found at [employer] Advance Health Care Directives Advance Health Care Directives. [email or web address]  Advance Care Planning is important because it:  [x]  Makes sure you receive the medical care that is consistent with your values, goals, and preferences  [x]  It provides guidance to your family and loved ones and reduces their decisional burden about whether or not they are making the right decisions based on your  wishes.  Follow the link provided in your after visit summary or read over the paperwork we have mailed to you to help you started getting your Advance Directives in place. If you need assistance in completing these, please reach out to us  so that we can help you!

## 2024-03-08 NOTE — Progress Notes (Signed)
 Subjective:   Jeremy Dorsey is a 73 y.o. who presents for a Medicare Wellness preventive visit.  As a reminder, Annual Wellness Visits don't include a physical exam, and some assessments may be limited, especially if this visit is performed virtually. We may recommend an in-person follow-up visit with your provider if needed.  Visit Complete: Virtual I connected with  Jeremy Dorsey on 03/08/24 by a audio enabled telemedicine application and verified that I am speaking with the correct person using two identifiers.  Patient Location: Home  Provider Location: Home Office  I discussed the limitations of evaluation and management by telemedicine. The patient expressed understanding and agreed to proceed.  Vital Signs: Because this visit was a virtual/telehealth visit, some criteria may be missing or patient reported. Any vitals not documented were not able to be obtained and vitals that have been documented are patient reported.  VideoDeclined- This patient declined Librarian, academic. Therefore the visit was completed with audio only.  Persons Participating in Visit: Patient.  AWV Questionnaire: No: Patient Medicare AWV questionnaire was not completed prior to this visit.  Cardiac Risk Factors include: advanced age (>7men, >34 women);diabetes mellitus;hypertension;dyslipidemia;sedentary lifestyle;obesity (BMI >30kg/m2)     Objective:    There were no vitals filed for this visit. There is no height or weight on file to calculate BMI.     03/08/2024    3:59 PM 10/07/2023    6:18 AM 09/16/2023    2:17 PM 09/15/2023    9:50 AM 06/22/2023    3:31 AM 06/21/2023   11:46 AM 03/03/2023    2:10 PM  Advanced Directives  Does Patient Have a Medical Advance Directive? No No No No  No Yes  Type of Tax inspector;Living will  Would patient like information on creating a medical advance directive? No - Patient declined    No  - Patient declined      Current Medications (verified) Outpatient Encounter Medications as of 03/08/2024  Medication Sig   aspirin  EC (ASPIRIN  LOW DOSE) 81 MG tablet TAKE 1 TABLET BY MOUTH EVERY MORNING (SWALLOW WHOLE) (Patient taking differently: Take 81 mg by mouth daily.)   atorvastatin  (LIPITOR ) 80 MG tablet TAKE 1 TABLET BY MOUTH EVERY DAY   AURYXIA 1 GM 210 MG(Fe) tablet Take 420 mg by mouth 3 (three) times daily with meals.   calcitRIOL  (ROCALTROL ) 0.25 MCG capsule Take 0.25 mcg by mouth every Monday, Wednesday, and Friday with hemodialysis.   carvedilol  (COREG ) 12.5 MG tablet Take 1 tablet (12.5 mg total) by mouth 2 (two) times daily.   cinacalcet  (SENSIPAR ) 30 MG tablet Take 30 mg by mouth daily with breakfast.   clopidogrel  (PLAVIX ) 75 MG tablet TAKE 1 TABLET BY MOUTH EVERY DAY   diazepam  (VALIUM ) 5 MG tablet Take 1 tablet (5 mg total) by mouth every 12 (twelve) hours as needed for anxiety.   FLUoxetine  (PROZAC ) 20 MG capsule Take 1 capsule (20 mg total) by mouth daily.   lidocaine -prilocaine (EMLA) cream Apply 1 Application topically daily as needed [facility] access).   Methoxy PEG-Epoetin  Beta (MIRCERA IJ) Inject 1 each into the skin See admin instructions. During Dialysis   multivitamin (RENA-VIT) TABS tablet Take 1 tablet by mouth daily.   nitroGLYCERIN  (NITROSTAT ) 0.4 MG SL tablet DISSOLVE 1 TAB UNDER TONGUE FOR CHEST PAIN - IF PAIN REMAINS AFTER 5 MIN, CALL 911 AND REPEAT DOSE. MAX 3 TABS IN 15 MINUTES   sildenafil (  VIAGRA) 100 MG tablet Take 100 mg by mouth daily as needed (Keep arterties open). Per doctor instruction   torsemide  (DEMADEX ) 100 MG tablet Take 100 mg by mouth every morning.   No facility-administered encounter medications on file as of 03/08/2024.    Allergies (verified) Oxycodone  and Hydrocodone    History: Past Medical History:  Diagnosis Date   Anemia    Cancer (HCC)    basal cell nose and forehead   Cataract 2016   Cataract surgery bith eyes   CKD  (chronic kidney disease), stage IV (HCC)    Coronary artery disease    a. s/p IVUS-guided DESx2 to prox & mid LAD, residual disease treated medically. EF 50-55% by recent echo 02/2020.   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   Urinary complication    Past Surgical History:  Procedure Laterality Date   ABDOMINAL AORTOGRAM W/LOWER EXTREMITY N/A 01/29/2022   Procedure: ABDOMINAL AORTOGRAM W/ Bilateral LOWER EXTREMITY [place];  Surgeon: Gretta Lonni PARAS, MD;  Location: [facility];  Service: Cardiovascular;  Laterality: N/A;   ABDOMINAL AORTOGRAM W/LOWER EXTREMITY N/A 06/11/2022   Procedure: ABDOMINAL AORTOGRAM W/LOWER EXTREMITY;  Surgeon: Gretta Lonni PARAS, MD;  Location: [facility];  Service: Cardiovascular;  Laterality: N/A;   AV FISTULA PLACEMENT Left 10/31/2020   Procedure: LEFT UPPER EXTREMITY ARTERIOVENOUS (AV) FISTULA CREATION;  Surgeon: Sheree Penne Lonni, MD;  Location: [facility] OR;  Service: Vascular;  Laterality: Left;   AV FISTULA PLACEMENT Left 07/09/2021   Procedure: LEFT ARM ARTERIOVENOUS (AV) FISTULA CREATION;  Surgeon: Serene Gaile ORN, MD;  Location: [facility] OR;  Service: Vascular;  Laterality: Left;   CARPAL TUNNEL RELEASE Right 10/07/2023   Procedure: CARPAL TUNNEL RELEASE;  Surgeon: Murrell Drivers, MD;  Location: [facility] OR;  Service: Orthopedics;  Laterality: Right;   COLONOSCOPY  1990   COLONOSCOPY  06/09/2011   Dr Dessa   CORONARY STENT INTERVENTION N/A 04/24/2020   Procedure: CORONARY STENT INTERVENTION;  Surgeon: Darron Deatrice LABOR, MD;  [facility]: [facility];  Service: Cardiovascular;  Laterality: N/A;   CORONARY ULTRASOUND/IVUS N/A 04/24/2020   Procedure: Intravascular Ultrasound/IVUS;  Surgeon: Darron Deatrice LABOR, MD;  [facility];   Service: Cardiovascular;  Laterality: N/A;   CYSTOSCOPY W/ RETROGRADES Bilateral 08/01/2018   Procedure: CYSTOSCOPY WITH RETROGRADE PYELOGRAM;  Surgeon: Francisca Redell BROCKS, MD;  Location: [facility] ORS;  Service: Urology;  Laterality: Bilateral;   CYSTOSCOPY WITH BIOPSY N/A 08/01/2018   Procedure: CYSTOSCOPY WITH Bladder BIOPSY;  Surgeon: Francisca Redell BROCKS, MD;  Location: [facility] ORS;  Service: Urology;  Laterality: N/A;   CYSTOSCOPY WITH STENT PLACEMENT Right 08/01/2018   Procedure: CYSTOSCOPY WITH STENT PLACEMENT;  Surgeon: Francisca Redell BROCKS, MD;  Location: [facility] ORS;  Service: Urology;  Laterality: Right;   DIALYSIS/PERMA CATHETER INSERTION Left 09/16/2023   Procedure: DIALYSIS/PERMA CATHETER INSERTION;  Surgeon: Norine Manuelita LABOR, MD;  Location: [facility] INVASIVE CV LAB;  Service: Cardiovascular;  Laterality: Left;   DIALYSIS/PERMA CATHETER INSERTION Right 11/04/2023   Procedure: DIALYSIS/PERMA CATHETER INSERTION;  Surgeon: Melia Lynwood ORN, MD;  Location: [facility] INVASIVE CV LAB;  Service: Cardiovascular;  Laterality: Right;   DIALYSIS/PERMA CATHETER REMOVAL Left 11/04/2023   Procedure: DIALYSIS/PERMA CATHETER REMOVAL;  Surgeon: Melia Lynwood ORN,  MD;  [facility];  Service: Cardiovascular;  Laterality: Left;   EYE SURGERY Right    laser surgery   FRACTURE SURGERY     Rt shoulder rotaor cuff   IR THROMBECTOMY AV FISTULA W/THROMBOLYSIS/PTA INC/SHUNT/IMG LEFT Left 06/22/2023   IR US  GUIDE VASC ACCESS LEFT  06/22/2023   LEFT HEART CATH AND CORONARY ANGIOGRAPHY N/A 04/24/2020   Procedure: LEFT HEART CATH AND CORONARY ANGIOGRAPHY;  Surgeon: Darron Deatrice LABOR, MD;  [facility];  Service: Cardiovascular;  Laterality: N/A;   LEFT HEART CATH AND CORONARY ANGIOGRAPHY Left 04/16/2023   Procedure: LEFT HEART CATH AND CORONARY ANGIOGRAPHY;  Surgeon: Darron Deatrice LABOR, MD;  Location: [facility];  Service: Cardiovascular;  Laterality: Left;   PERIPHERAL VASCULAR BALLOON ANGIOPLASTY Right 01/29/2022    Procedure: PERIPHERAL VASCULAR BALLOON ANGIOPLASTY;  Surgeon: Gretta Lonni PARAS, MD;  [facility];  Service: Cardiovascular;  Laterality: Right;   PERIPHERAL VASCULAR BALLOON ANGIOPLASTY Right 06/11/2022   Procedure: PERIPHERAL VASCULAR BALLOON ANGIOPLASTY;  Surgeon: Gretta Lonni PARAS, MD;  Location: [facility];  Service: Cardiovascular;  Laterality: Right;  Peroneal   PERIPHERAL VASCULAR BALLOON ANGIOPLASTY  09/15/2023   Procedure: PERIPHERAL VASCULAR BALLOON ANGIOPLASTY;  Surgeon: Tobie Gordy POUR, MD;  Location: [facility] INVASIVE CV LAB;  Service: Cardiovascular;;  Outflow/Inflow Cephalic Vein   PERIPHERAL VASCULAR INTERVENTION  09/15/2023   Procedure: PERIPHERAL VASCULAR INTERVENTION;  Surgeon: Tobie Gordy POUR, MD;  Location: [facility] INVASIVE CV LAB;  Service: Cardiovascular;;  8x10 Viabahn   PERIPHERAL VASCULAR THROMBECTOMY N/A 09/15/2023   Procedure: PERIPHERAL VASCULAR THROMBECTOMY;  Surgeon: Tobie Gordy POUR, MD;  Location: [facility] INVASIVE CV LAB;  Service: Cardiovascular;  Laterality: N/A;   SHOULDER ARTHROSCOPY WITH OPEN ROTATOR CUFF REPAIR Right 08/25/2018   Procedure: SHOULDER ARTHROSCOPY WITH MINI OPEN ROTATOR CUFF REPAIR;  Surgeon: Marchia Drivers, MD;  Location: [facility] ORS;  Service: Orthopedics;  Laterality: Right;   TRANSURETHRAL RESECTION OF BLADDER TUMOR N/A 08/01/2018   Procedure: TRANSURETHRAL RESECTION OF BLADDER TUMOR (TURBT);  Surgeon: Francisca Redell BROCKS, MD;  Location: [facility] OR[facility];  Service: Urology;  Laterality: N/A;   URETEROSCOPY WITH HOLMIUM LASER LITHOTRIPSY Right 08/01/2018   Procedure: URETEROSCOPY WITH HOLMIUM LASER LITHOTRIPSY;  Surgeon: Francisca Redell BROCKS, MD;  Location: [facility] ORS;  Service: Urology;  Laterality: Right;   Family History  Problem Relation Age of Onset   Psoriasis Mother    Heart failure Father    Social History   Socioeconomic History   Marital status: Married    Spouse name: Not on file   Number of children: 3   Years of education: Not on  file   Highest education level: Bachelor's degree (e.g., BA, AB, BS)  Occupational History    Comment: retired  Tobacco Use   Smoking status: Former    Current packs/day: 0.00    Average packs/day: 1 pack/day for 30.0 years (30.0 ttl pk-yrs)    Types: Cigarettes    Start date: 08/22/1977    Quit date: 08/23/2007    Years since quitting: 16.5    Passive exposure: Past   Smokeless tobacco: Never  Vaping Use   Vaping status: Never Used  Substance and Sexual Activity   Alcohol use: No   Drug use: No   Sexual activity: Not Currently    Birth control/protection: None  Other Topics Concern   Not on file  Social History Narrative   Not on file   Social Drivers of Health   Financial  Resource Strain: Low Risk  (03/08/2024)   Overall Financial Resource Strain (CARDIA)    Difficulty of Paying Living Expenses: Not hard at all  Food Insecurity: No Food Insecurity (03/08/2024)   Hunger Vital Sign    Worried About Running Out of Food in the Last Year: Never true    Ran Out of Food in the Last Year: Never true  Transportation Needs: No Transportation Needs (03/08/2024)   PRAPARE - Administrator, Civil Service (Medical): No    Lack of Transportation (Non-Medical): No  Physical Activity: Insufficiently Active (03/08/2024)   Exercise Vital Sign    Days of Exercise per Week: 2 days    Minutes of Exercise per Session: 20 min  Stress: No Stress Concern Present (03/08/2024)   [employer] of Occupational Health - Occupational Stress Questionnaire    Feeling of Stress: Not at all  Social Connections: Socially Isolated (03/08/2024)   Social Connection and Isolation Panel    Frequency of Communication with Friends and Family: Once a week    Frequency of Social Gatherings with Friends and Family: Once a week    Attends Religious Services: Never    Database administrator or Organizations: No    Attends Engineer, structural: Never    Marital Status: Married    Tobacco  Counseling Counseling given: Not Answered    Clinical Intake:  Pre-visit preparation completed: Yes  Pain : No/denies pain     BMI - recorded: 32.7 Nutritional Status: BMI > 30  Obese Nutritional Risks: None Diabetes: Yes CBG done?: No Did pt. bring in CBG monitor from home?: No  Lab Results  Component Value Date   HGBA1C 6.2 05/20/2021   HGBA1C 7.3 12/12/2020   HGBA1C 7.3 (A) 05/22/2020     How often do you need to have someone help you when you read instructions, pamphlets, or other written materials from your doctor or pharmacy?: 1 - Never  Interpreter Needed?: No  Information entered by :: Jeremy DAS, LPN   Activities of Daily Living     03/08/2024    4:00 PM 11/04/2023    6:40 AM  In your present state of health, do you have any difficulty performing the following activities:  Hearing? 0 0  Vision? 0 0  Difficulty concentrating or making decisions? 0 0  Walking or climbing stairs? 1   Dressing or bathing? 0   Doing errands, shopping? 0   Preparing Food and eating ? N   Using the Toilet? N   In the past six months, have you accidently leaked urine? N   Do you have problems with loss of bowel control? N   Managing your Medications? N   Managing your Finances? N   Housekeeping or managing your Housekeeping? N     Patient Care Team: Sharma Coyer, MD as PCP - General (Family Medicine) Perla Evalene PARAS, MD as PCP - Cardiology (Cardiology) Prescilla Beams, MD as Consulting Physician (Nephrology) Carolee Manus DASEN., MD (Ophthalmology) Dasher, Alm LABOR, MD (Dermatology)  I have updated your Care Teams any recent Medical Services you may have received from other providers in the past year.     Assessment:   This is a routine wellness examination for Jeremy Dorsey.  Hearing/Vision screen Hearing Screening - Comments:: NO AIDS Vision Screening - Comments:: READERS- DR.BELL   Goals Addressed             This Visit's Progress    DIET -  INCREASE WATER INTAKE  Depression Screen     03/08/2024    3:58 PM 09/20/2023    3:13 PM 03/03/2023    2:09 PM 12/16/2022    9:43 AM 01/05/2022    3:34 PM 06/19/2021    9:46 AM 06/19/2021    9:41 AM  PHQ 2/9 Scores  PHQ - 2 Score 0 1 0 0 1 0 0  PHQ- 9 Score 0 5  0 4 0 0    Fall Risk     03/08/2024    3:59 PM 09/20/2023    3:14 PM 03/02/2023   11:52 AM 12/16/2022    9:43 AM 01/05/2022    3:34 PM  Fall Risk   Falls in the past year? 0 0 0 0 0  Number falls in past yr: 0 0  0 0  Injury with Fall? 0 0  0 0  Risk for fall due to : No Fall Risks No Fall Risks  History of fall(s)   Follow up Falls evaluation completed;Falls prevention discussed Falls evaluation completed  Falls evaluation completed     MEDICARE RISK AT HOME:  Medicare Risk at Home Any stairs in or around the home?: Yes If so, are there any without handrails?: Yes Home free of loose throw rugs in walkways, pet beds, electrical cords, etc?: Yes Adequate lighting in your home to reduce risk of falls?: Yes Life alert?: No Use of a cane, walker or w/c?: No Grab bars in the bathroom?: No Shower chair or bench in shower?: Yes Elevated toilet seat or a handicapped toilet?: No  TIMED UP AND GO:  Was the test performed?  No  Cognitive Function: 6CIT completed        03/08/2024    4:01 PM 03/03/2023    2:12 PM  6CIT Screen  What Year? 0 points 0 points  What month? 0 points 0 points  What time? 0 points 0 points  Count back from 20 0 points 0 points  Months in reverse 0 points 0 points  Repeat phrase 10 points 0 points  Total Score 10 points 0 points    Immunizations Immunization History  Administered Date(s) Administered   Fluad Quad(high Dose 65+) 05/24/2019, 05/22/2020, 05/30/2021   Fluad Trivalent(High Dose 65+) 06/02/2023   Hepb-cpg 01/01/2022, 06/30/2022, 12/22/2022   Influenza Split 06/07/2006   Influenza, High Dose Seasonal PF 06/10/2018   Influenza,inj,Quad PF,6+ Mos 07/25/2013, 09/06/2014,  06/10/2022   Influenza-Unspecified 05/24/2019, 05/22/2020   PFIZER Comirnaty(Gray Top)Covid-19 Tri-Sucrose Vaccine 09/26/2019, 10/17/2019   PFIZER(Purple Top)SARS-COV-2 Vaccination 09/26/2019, 10/17/2019   Pneumococcal Conjugate-13 05/24/2019   Pneumococcal Polysaccharide-23 06/14/2004, 05/22/2020   Zoster, Live 01/23/2011    Screening Tests Health Maintenance  Topic Date Due   DTaP/Tdap/Td (1 - Tdap) Never done   Zoster Vaccines- Shingrix  (1 of 2) 03/30/1969   FOOT EXAM  05/17/2021   Colonoscopy  06/08/2021   HEMOGLOBIN A1C  11/18/2021   OPHTHALMOLOGY EXAM  01/02/2022   COVID-19 Vaccine (5 - 2024-25 season) 04/04/2023   INFLUENZA VACCINE  03/03/2024   Medicare Annual Wellness (AWV)  03/08/2025   Pneumococcal Vaccine: 50+ Years  Completed   Hepatitis B Vaccines  Completed   Hepatitis C Screening  Completed   HPV VACCINES  Aged Out   Meningococcal B Vaccine  Aged Out    Health Maintenance  Health Maintenance Due  Topic Date Due   DTaP/Tdap/Td (1 - Tdap) Never done   Zoster Vaccines- Shingrix  (1 of 2) 03/30/1969   FOOT EXAM  05/17/2021   Colonoscopy  06/08/2021   HEMOGLOBIN A1C  11/18/2021   OPHTHALMOLOGY EXAM  01/02/2022   COVID-19 Vaccine (5 - 2024-25 season) 04/04/2023   INFLUENZA VACCINE  03/03/2024   Health Maintenance Items Addressed: DECLINES COLONOSCOPY; UP TO DATE ON PNA; NEEDS SHINGRIX - DECLINES COVIDS  Additional Screening:  Vision Screening: Recommended annual ophthalmology exams for early detection of glaucoma and other disorders of the eye. Would you like a referral to an eye doctor? No    Dental Screening: Recommended annual dental exams for proper oral hygiene  Community Resource Referral / Chronic Care Management: CRR required this visit?  No   CCM required this visit?  No   Plan:    I have personally reviewed and noted the following in the patient's chart:   Medical and social history Use of alcohol, tobacco or illicit drugs  Current  medications and supplements including opioid prescriptions. Patient is not currently taking opioid prescriptions. Functional ability and status Nutritional status Physical activity Advanced directives List of other physicians Hospitalizations, surgeries, and ER visits in previous 12 months Vitals Screenings to include cognitive, depression, and falls Referrals and appointments  In addition, I have reviewed and discussed with patient certain preventive protocols, quality metrics, and best practice recommendations. A written personalized care plan for preventive services as well as general preventive health recommendations were provided to patient.   Jeremy GORMAN Das, LPN   08/05/7972   After Visit Summary: (MyChart) Due to this being a telephonic visit, the after visit summary with patients personalized plan was offered to patient via MyChart   Notes: Nothing significant to report at this time.

## 2024-03-09 DIAGNOSIS — Z992 Dependence on renal dialysis: Secondary | ICD-10-CM | POA: Diagnosis not present

## 2024-03-09 DIAGNOSIS — Z79899 Other long term (current) drug therapy: Secondary | ICD-10-CM | POA: Diagnosis not present

## 2024-03-09 DIAGNOSIS — R82998 Other abnormal findings in urine: Secondary | ICD-10-CM | POA: Diagnosis not present

## 2024-03-09 DIAGNOSIS — N184 Chronic kidney disease, stage 4 (severe): Secondary | ICD-10-CM | POA: Diagnosis not present

## 2024-03-09 DIAGNOSIS — D509 Iron deficiency anemia, unspecified: Secondary | ICD-10-CM | POA: Diagnosis not present

## 2024-03-09 DIAGNOSIS — N2581 Secondary hyperparathyroidism of renal origin: Secondary | ICD-10-CM | POA: Diagnosis not present

## 2024-03-09 DIAGNOSIS — E440 Moderate protein-calorie malnutrition: Secondary | ICD-10-CM | POA: Diagnosis not present

## 2024-03-09 DIAGNOSIS — D631 Anemia in chronic kidney disease: Secondary | ICD-10-CM | POA: Diagnosis not present

## 2024-03-09 DIAGNOSIS — R17 Unspecified jaundice: Secondary | ICD-10-CM | POA: Diagnosis not present

## 2024-03-09 DIAGNOSIS — N186 End stage renal disease: Secondary | ICD-10-CM | POA: Diagnosis not present

## 2024-03-13 DIAGNOSIS — E440 Moderate protein-calorie malnutrition: Secondary | ICD-10-CM | POA: Diagnosis not present

## 2024-03-13 DIAGNOSIS — N186 End stage renal disease: Secondary | ICD-10-CM | POA: Diagnosis not present

## 2024-03-13 DIAGNOSIS — R17 Unspecified jaundice: Secondary | ICD-10-CM | POA: Diagnosis not present

## 2024-03-13 DIAGNOSIS — N184 Chronic kidney disease, stage 4 (severe): Secondary | ICD-10-CM | POA: Diagnosis not present

## 2024-03-13 DIAGNOSIS — D509 Iron deficiency anemia, unspecified: Secondary | ICD-10-CM | POA: Diagnosis not present

## 2024-03-13 DIAGNOSIS — Z992 Dependence on renal dialysis: Secondary | ICD-10-CM | POA: Diagnosis not present

## 2024-03-13 DIAGNOSIS — Z79899 Other long term (current) drug therapy: Secondary | ICD-10-CM | POA: Diagnosis not present

## 2024-03-13 DIAGNOSIS — N2581 Secondary hyperparathyroidism of renal origin: Secondary | ICD-10-CM | POA: Diagnosis not present

## 2024-03-13 DIAGNOSIS — D631 Anemia in chronic kidney disease: Secondary | ICD-10-CM | POA: Diagnosis not present

## 2024-03-13 DIAGNOSIS — R82998 Other abnormal findings in urine: Secondary | ICD-10-CM | POA: Diagnosis not present

## 2024-03-15 DIAGNOSIS — N186 End stage renal disease: Secondary | ICD-10-CM | POA: Diagnosis not present

## 2024-03-15 DIAGNOSIS — Z79899 Other long term (current) drug therapy: Secondary | ICD-10-CM | POA: Diagnosis not present

## 2024-03-15 DIAGNOSIS — R82998 Other abnormal findings in urine: Secondary | ICD-10-CM | POA: Diagnosis not present

## 2024-03-15 DIAGNOSIS — Z992 Dependence on renal dialysis: Secondary | ICD-10-CM | POA: Diagnosis not present

## 2024-03-15 DIAGNOSIS — N184 Chronic kidney disease, stage 4 (severe): Secondary | ICD-10-CM | POA: Diagnosis not present

## 2024-03-15 DIAGNOSIS — D631 Anemia in chronic kidney disease: Secondary | ICD-10-CM | POA: Diagnosis not present

## 2024-03-15 DIAGNOSIS — D509 Iron deficiency anemia, unspecified: Secondary | ICD-10-CM | POA: Diagnosis not present

## 2024-03-15 DIAGNOSIS — R17 Unspecified jaundice: Secondary | ICD-10-CM | POA: Diagnosis not present

## 2024-03-15 DIAGNOSIS — N2581 Secondary hyperparathyroidism of renal origin: Secondary | ICD-10-CM | POA: Diagnosis not present

## 2024-03-15 DIAGNOSIS — E440 Moderate protein-calorie malnutrition: Secondary | ICD-10-CM | POA: Diagnosis not present

## 2024-03-16 DIAGNOSIS — Z79899 Other long term (current) drug therapy: Secondary | ICD-10-CM | POA: Diagnosis not present

## 2024-03-16 DIAGNOSIS — D509 Iron deficiency anemia, unspecified: Secondary | ICD-10-CM | POA: Diagnosis not present

## 2024-03-16 DIAGNOSIS — N2581 Secondary hyperparathyroidism of renal origin: Secondary | ICD-10-CM | POA: Diagnosis not present

## 2024-03-16 DIAGNOSIS — N184 Chronic kidney disease, stage 4 (severe): Secondary | ICD-10-CM | POA: Diagnosis not present

## 2024-03-16 DIAGNOSIS — Z992 Dependence on renal dialysis: Secondary | ICD-10-CM | POA: Diagnosis not present

## 2024-03-16 DIAGNOSIS — R17 Unspecified jaundice: Secondary | ICD-10-CM | POA: Diagnosis not present

## 2024-03-16 DIAGNOSIS — D631 Anemia in chronic kidney disease: Secondary | ICD-10-CM | POA: Diagnosis not present

## 2024-03-16 DIAGNOSIS — N186 End stage renal disease: Secondary | ICD-10-CM | POA: Diagnosis not present

## 2024-03-16 DIAGNOSIS — R82998 Other abnormal findings in urine: Secondary | ICD-10-CM | POA: Diagnosis not present

## 2024-03-16 DIAGNOSIS — E440 Moderate protein-calorie malnutrition: Secondary | ICD-10-CM | POA: Diagnosis not present

## 2024-03-17 DIAGNOSIS — R17 Unspecified jaundice: Secondary | ICD-10-CM | POA: Diagnosis not present

## 2024-03-17 DIAGNOSIS — Z992 Dependence on renal dialysis: Secondary | ICD-10-CM | POA: Diagnosis not present

## 2024-03-17 DIAGNOSIS — D631 Anemia in chronic kidney disease: Secondary | ICD-10-CM | POA: Diagnosis not present

## 2024-03-17 DIAGNOSIS — N184 Chronic kidney disease, stage 4 (severe): Secondary | ICD-10-CM | POA: Diagnosis not present

## 2024-03-17 DIAGNOSIS — N186 End stage renal disease: Secondary | ICD-10-CM | POA: Diagnosis not present

## 2024-03-17 DIAGNOSIS — E440 Moderate protein-calorie malnutrition: Secondary | ICD-10-CM | POA: Diagnosis not present

## 2024-03-17 DIAGNOSIS — N2581 Secondary hyperparathyroidism of renal origin: Secondary | ICD-10-CM | POA: Diagnosis not present

## 2024-03-17 DIAGNOSIS — D509 Iron deficiency anemia, unspecified: Secondary | ICD-10-CM | POA: Diagnosis not present

## 2024-03-17 DIAGNOSIS — Z79899 Other long term (current) drug therapy: Secondary | ICD-10-CM | POA: Diagnosis not present

## 2024-03-17 DIAGNOSIS — R82998 Other abnormal findings in urine: Secondary | ICD-10-CM | POA: Diagnosis not present

## 2024-03-20 DIAGNOSIS — D509 Iron deficiency anemia, unspecified: Secondary | ICD-10-CM | POA: Diagnosis not present

## 2024-03-20 DIAGNOSIS — R17 Unspecified jaundice: Secondary | ICD-10-CM | POA: Diagnosis not present

## 2024-03-20 DIAGNOSIS — N184 Chronic kidney disease, stage 4 (severe): Secondary | ICD-10-CM | POA: Diagnosis not present

## 2024-03-20 DIAGNOSIS — R82998 Other abnormal findings in urine: Secondary | ICD-10-CM | POA: Diagnosis not present

## 2024-03-20 DIAGNOSIS — E440 Moderate protein-calorie malnutrition: Secondary | ICD-10-CM | POA: Diagnosis not present

## 2024-03-20 DIAGNOSIS — N2581 Secondary hyperparathyroidism of renal origin: Secondary | ICD-10-CM | POA: Diagnosis not present

## 2024-03-20 DIAGNOSIS — Z992 Dependence on renal dialysis: Secondary | ICD-10-CM | POA: Diagnosis not present

## 2024-03-20 DIAGNOSIS — N186 End stage renal disease: Secondary | ICD-10-CM | POA: Diagnosis not present

## 2024-03-20 DIAGNOSIS — Z79899 Other long term (current) drug therapy: Secondary | ICD-10-CM | POA: Diagnosis not present

## 2024-03-20 DIAGNOSIS — D631 Anemia in chronic kidney disease: Secondary | ICD-10-CM | POA: Diagnosis not present

## 2024-03-22 DIAGNOSIS — D509 Iron deficiency anemia, unspecified: Secondary | ICD-10-CM | POA: Diagnosis not present

## 2024-03-22 DIAGNOSIS — R82998 Other abnormal findings in urine: Secondary | ICD-10-CM | POA: Diagnosis not present

## 2024-03-22 DIAGNOSIS — Z992 Dependence on renal dialysis: Secondary | ICD-10-CM | POA: Diagnosis not present

## 2024-03-22 DIAGNOSIS — Z79899 Other long term (current) drug therapy: Secondary | ICD-10-CM | POA: Diagnosis not present

## 2024-03-22 DIAGNOSIS — N184 Chronic kidney disease, stage 4 (severe): Secondary | ICD-10-CM | POA: Diagnosis not present

## 2024-03-22 DIAGNOSIS — R17 Unspecified jaundice: Secondary | ICD-10-CM | POA: Diagnosis not present

## 2024-03-22 DIAGNOSIS — N2581 Secondary hyperparathyroidism of renal origin: Secondary | ICD-10-CM | POA: Diagnosis not present

## 2024-03-22 DIAGNOSIS — D631 Anemia in chronic kidney disease: Secondary | ICD-10-CM | POA: Diagnosis not present

## 2024-03-22 DIAGNOSIS — E440 Moderate protein-calorie malnutrition: Secondary | ICD-10-CM | POA: Diagnosis not present

## 2024-03-22 DIAGNOSIS — N186 End stage renal disease: Secondary | ICD-10-CM | POA: Diagnosis not present

## 2024-03-24 DIAGNOSIS — N2581 Secondary hyperparathyroidism of renal origin: Secondary | ICD-10-CM | POA: Diagnosis not present

## 2024-03-24 DIAGNOSIS — Z79899 Other long term (current) drug therapy: Secondary | ICD-10-CM | POA: Diagnosis not present

## 2024-03-24 DIAGNOSIS — N186 End stage renal disease: Secondary | ICD-10-CM | POA: Diagnosis not present

## 2024-03-24 DIAGNOSIS — D509 Iron deficiency anemia, unspecified: Secondary | ICD-10-CM | POA: Diagnosis not present

## 2024-03-24 DIAGNOSIS — N184 Chronic kidney disease, stage 4 (severe): Secondary | ICD-10-CM | POA: Diagnosis not present

## 2024-03-24 DIAGNOSIS — R82998 Other abnormal findings in urine: Secondary | ICD-10-CM | POA: Diagnosis not present

## 2024-03-24 DIAGNOSIS — D631 Anemia in chronic kidney disease: Secondary | ICD-10-CM | POA: Diagnosis not present

## 2024-03-24 DIAGNOSIS — R17 Unspecified jaundice: Secondary | ICD-10-CM | POA: Diagnosis not present

## 2024-03-24 DIAGNOSIS — Z992 Dependence on renal dialysis: Secondary | ICD-10-CM | POA: Diagnosis not present

## 2024-03-24 DIAGNOSIS — E440 Moderate protein-calorie malnutrition: Secondary | ICD-10-CM | POA: Diagnosis not present

## 2024-03-27 DIAGNOSIS — R17 Unspecified jaundice: Secondary | ICD-10-CM | POA: Diagnosis not present

## 2024-03-27 DIAGNOSIS — Z992 Dependence on renal dialysis: Secondary | ICD-10-CM | POA: Diagnosis not present

## 2024-03-27 DIAGNOSIS — N2581 Secondary hyperparathyroidism of renal origin: Secondary | ICD-10-CM | POA: Diagnosis not present

## 2024-03-27 DIAGNOSIS — N184 Chronic kidney disease, stage 4 (severe): Secondary | ICD-10-CM | POA: Diagnosis not present

## 2024-03-27 DIAGNOSIS — D509 Iron deficiency anemia, unspecified: Secondary | ICD-10-CM | POA: Diagnosis not present

## 2024-03-27 DIAGNOSIS — D631 Anemia in chronic kidney disease: Secondary | ICD-10-CM | POA: Diagnosis not present

## 2024-03-27 DIAGNOSIS — Z79899 Other long term (current) drug therapy: Secondary | ICD-10-CM | POA: Diagnosis not present

## 2024-03-27 DIAGNOSIS — R82998 Other abnormal findings in urine: Secondary | ICD-10-CM | POA: Diagnosis not present

## 2024-03-27 DIAGNOSIS — E440 Moderate protein-calorie malnutrition: Secondary | ICD-10-CM | POA: Diagnosis not present

## 2024-03-27 DIAGNOSIS — N186 End stage renal disease: Secondary | ICD-10-CM | POA: Diagnosis not present

## 2024-03-29 DIAGNOSIS — D631 Anemia in chronic kidney disease: Secondary | ICD-10-CM | POA: Diagnosis not present

## 2024-03-29 DIAGNOSIS — N184 Chronic kidney disease, stage 4 (severe): Secondary | ICD-10-CM | POA: Diagnosis not present

## 2024-03-29 DIAGNOSIS — Z992 Dependence on renal dialysis: Secondary | ICD-10-CM | POA: Diagnosis not present

## 2024-03-29 DIAGNOSIS — D509 Iron deficiency anemia, unspecified: Secondary | ICD-10-CM | POA: Diagnosis not present

## 2024-03-29 DIAGNOSIS — Z79899 Other long term (current) drug therapy: Secondary | ICD-10-CM | POA: Diagnosis not present

## 2024-03-29 DIAGNOSIS — E440 Moderate protein-calorie malnutrition: Secondary | ICD-10-CM | POA: Diagnosis not present

## 2024-03-29 DIAGNOSIS — N2581 Secondary hyperparathyroidism of renal origin: Secondary | ICD-10-CM | POA: Diagnosis not present

## 2024-03-29 DIAGNOSIS — R17 Unspecified jaundice: Secondary | ICD-10-CM | POA: Diagnosis not present

## 2024-03-29 DIAGNOSIS — R82998 Other abnormal findings in urine: Secondary | ICD-10-CM | POA: Diagnosis not present

## 2024-03-29 DIAGNOSIS — N186 End stage renal disease: Secondary | ICD-10-CM | POA: Diagnosis not present

## 2024-03-31 DIAGNOSIS — D509 Iron deficiency anemia, unspecified: Secondary | ICD-10-CM | POA: Diagnosis not present

## 2024-03-31 DIAGNOSIS — Z79899 Other long term (current) drug therapy: Secondary | ICD-10-CM | POA: Diagnosis not present

## 2024-03-31 DIAGNOSIS — N2581 Secondary hyperparathyroidism of renal origin: Secondary | ICD-10-CM | POA: Diagnosis not present

## 2024-03-31 DIAGNOSIS — E440 Moderate protein-calorie malnutrition: Secondary | ICD-10-CM | POA: Diagnosis not present

## 2024-03-31 DIAGNOSIS — R82998 Other abnormal findings in urine: Secondary | ICD-10-CM | POA: Diagnosis not present

## 2024-03-31 DIAGNOSIS — N186 End stage renal disease: Secondary | ICD-10-CM | POA: Diagnosis not present

## 2024-03-31 DIAGNOSIS — Z992 Dependence on renal dialysis: Secondary | ICD-10-CM | POA: Diagnosis not present

## 2024-03-31 DIAGNOSIS — N184 Chronic kidney disease, stage 4 (severe): Secondary | ICD-10-CM | POA: Diagnosis not present

## 2024-03-31 DIAGNOSIS — R17 Unspecified jaundice: Secondary | ICD-10-CM | POA: Diagnosis not present

## 2024-03-31 DIAGNOSIS — D631 Anemia in chronic kidney disease: Secondary | ICD-10-CM | POA: Diagnosis not present

## 2024-04-02 DIAGNOSIS — I129 Hypertensive chronic kidney disease with stage 1 through stage 4 chronic kidney disease, or unspecified chronic kidney disease: Secondary | ICD-10-CM | POA: Diagnosis not present

## 2024-04-02 DIAGNOSIS — Z992 Dependence on renal dialysis: Secondary | ICD-10-CM | POA: Diagnosis not present

## 2024-04-02 DIAGNOSIS — N186 End stage renal disease: Secondary | ICD-10-CM | POA: Diagnosis not present

## 2024-04-03 DIAGNOSIS — Z992 Dependence on renal dialysis: Secondary | ICD-10-CM | POA: Diagnosis not present

## 2024-04-03 DIAGNOSIS — N184 Chronic kidney disease, stage 4 (severe): Secondary | ICD-10-CM | POA: Diagnosis not present

## 2024-04-03 DIAGNOSIS — E1122 Type 2 diabetes mellitus with diabetic chronic kidney disease: Secondary | ICD-10-CM | POA: Diagnosis not present

## 2024-04-03 DIAGNOSIS — N2589 Other disorders resulting from impaired renal tubular function: Secondary | ICD-10-CM | POA: Diagnosis not present

## 2024-04-03 DIAGNOSIS — N2581 Secondary hyperparathyroidism of renal origin: Secondary | ICD-10-CM | POA: Diagnosis not present

## 2024-04-03 DIAGNOSIS — N186 End stage renal disease: Secondary | ICD-10-CM | POA: Diagnosis not present

## 2024-04-03 DIAGNOSIS — D509 Iron deficiency anemia, unspecified: Secondary | ICD-10-CM | POA: Diagnosis not present

## 2024-04-03 DIAGNOSIS — R82998 Other abnormal findings in urine: Secondary | ICD-10-CM | POA: Diagnosis not present

## 2024-04-03 DIAGNOSIS — D631 Anemia in chronic kidney disease: Secondary | ICD-10-CM | POA: Diagnosis not present

## 2024-04-05 DIAGNOSIS — N184 Chronic kidney disease, stage 4 (severe): Secondary | ICD-10-CM | POA: Diagnosis not present

## 2024-04-05 DIAGNOSIS — D509 Iron deficiency anemia, unspecified: Secondary | ICD-10-CM | POA: Diagnosis not present

## 2024-04-05 DIAGNOSIS — N186 End stage renal disease: Secondary | ICD-10-CM | POA: Diagnosis not present

## 2024-04-05 DIAGNOSIS — E1122 Type 2 diabetes mellitus with diabetic chronic kidney disease: Secondary | ICD-10-CM | POA: Diagnosis not present

## 2024-04-05 DIAGNOSIS — D631 Anemia in chronic kidney disease: Secondary | ICD-10-CM | POA: Diagnosis not present

## 2024-04-05 DIAGNOSIS — N2589 Other disorders resulting from impaired renal tubular function: Secondary | ICD-10-CM | POA: Diagnosis not present

## 2024-04-05 DIAGNOSIS — Z992 Dependence on renal dialysis: Secondary | ICD-10-CM | POA: Diagnosis not present

## 2024-04-05 DIAGNOSIS — N2581 Secondary hyperparathyroidism of renal origin: Secondary | ICD-10-CM | POA: Diagnosis not present

## 2024-04-05 DIAGNOSIS — R82998 Other abnormal findings in urine: Secondary | ICD-10-CM | POA: Diagnosis not present

## 2024-04-07 DIAGNOSIS — E1122 Type 2 diabetes mellitus with diabetic chronic kidney disease: Secondary | ICD-10-CM | POA: Diagnosis not present

## 2024-04-07 DIAGNOSIS — N2589 Other disorders resulting from impaired renal tubular function: Secondary | ICD-10-CM | POA: Diagnosis not present

## 2024-04-07 DIAGNOSIS — R82998 Other abnormal findings in urine: Secondary | ICD-10-CM | POA: Diagnosis not present

## 2024-04-07 DIAGNOSIS — D631 Anemia in chronic kidney disease: Secondary | ICD-10-CM | POA: Diagnosis not present

## 2024-04-07 DIAGNOSIS — N184 Chronic kidney disease, stage 4 (severe): Secondary | ICD-10-CM | POA: Diagnosis not present

## 2024-04-07 DIAGNOSIS — N2581 Secondary hyperparathyroidism of renal origin: Secondary | ICD-10-CM | POA: Diagnosis not present

## 2024-04-07 DIAGNOSIS — D509 Iron deficiency anemia, unspecified: Secondary | ICD-10-CM | POA: Diagnosis not present

## 2024-04-07 DIAGNOSIS — Z992 Dependence on renal dialysis: Secondary | ICD-10-CM | POA: Diagnosis not present

## 2024-04-07 DIAGNOSIS — N186 End stage renal disease: Secondary | ICD-10-CM | POA: Diagnosis not present

## 2024-04-10 DIAGNOSIS — R82998 Other abnormal findings in urine: Secondary | ICD-10-CM | POA: Diagnosis not present

## 2024-04-10 DIAGNOSIS — N186 End stage renal disease: Secondary | ICD-10-CM | POA: Diagnosis not present

## 2024-04-10 DIAGNOSIS — D631 Anemia in chronic kidney disease: Secondary | ICD-10-CM | POA: Diagnosis not present

## 2024-04-10 DIAGNOSIS — N2589 Other disorders resulting from impaired renal tubular function: Secondary | ICD-10-CM | POA: Diagnosis not present

## 2024-04-10 DIAGNOSIS — D509 Iron deficiency anemia, unspecified: Secondary | ICD-10-CM | POA: Diagnosis not present

## 2024-04-10 DIAGNOSIS — N184 Chronic kidney disease, stage 4 (severe): Secondary | ICD-10-CM | POA: Diagnosis not present

## 2024-04-10 DIAGNOSIS — E1122 Type 2 diabetes mellitus with diabetic chronic kidney disease: Secondary | ICD-10-CM | POA: Diagnosis not present

## 2024-04-10 DIAGNOSIS — Z992 Dependence on renal dialysis: Secondary | ICD-10-CM | POA: Diagnosis not present

## 2024-04-10 DIAGNOSIS — N2581 Secondary hyperparathyroidism of renal origin: Secondary | ICD-10-CM | POA: Diagnosis not present

## 2024-04-11 DIAGNOSIS — N2589 Other disorders resulting from impaired renal tubular function: Secondary | ICD-10-CM | POA: Diagnosis not present

## 2024-04-11 DIAGNOSIS — E1122 Type 2 diabetes mellitus with diabetic chronic kidney disease: Secondary | ICD-10-CM | POA: Diagnosis not present

## 2024-04-11 DIAGNOSIS — D509 Iron deficiency anemia, unspecified: Secondary | ICD-10-CM | POA: Diagnosis not present

## 2024-04-11 DIAGNOSIS — Z992 Dependence on renal dialysis: Secondary | ICD-10-CM | POA: Diagnosis not present

## 2024-04-11 DIAGNOSIS — N2581 Secondary hyperparathyroidism of renal origin: Secondary | ICD-10-CM | POA: Diagnosis not present

## 2024-04-11 DIAGNOSIS — R82998 Other abnormal findings in urine: Secondary | ICD-10-CM | POA: Diagnosis not present

## 2024-04-11 DIAGNOSIS — N186 End stage renal disease: Secondary | ICD-10-CM | POA: Diagnosis not present

## 2024-04-11 DIAGNOSIS — D631 Anemia in chronic kidney disease: Secondary | ICD-10-CM | POA: Diagnosis not present

## 2024-04-11 DIAGNOSIS — N184 Chronic kidney disease, stage 4 (severe): Secondary | ICD-10-CM | POA: Diagnosis not present

## 2024-04-12 DIAGNOSIS — N184 Chronic kidney disease, stage 4 (severe): Secondary | ICD-10-CM | POA: Diagnosis not present

## 2024-04-12 DIAGNOSIS — N2581 Secondary hyperparathyroidism of renal origin: Secondary | ICD-10-CM | POA: Diagnosis not present

## 2024-04-12 DIAGNOSIS — E1122 Type 2 diabetes mellitus with diabetic chronic kidney disease: Secondary | ICD-10-CM | POA: Diagnosis not present

## 2024-04-12 DIAGNOSIS — N2589 Other disorders resulting from impaired renal tubular function: Secondary | ICD-10-CM | POA: Diagnosis not present

## 2024-04-12 DIAGNOSIS — D631 Anemia in chronic kidney disease: Secondary | ICD-10-CM | POA: Diagnosis not present

## 2024-04-12 DIAGNOSIS — R82998 Other abnormal findings in urine: Secondary | ICD-10-CM | POA: Diagnosis not present

## 2024-04-12 DIAGNOSIS — Z992 Dependence on renal dialysis: Secondary | ICD-10-CM | POA: Diagnosis not present

## 2024-04-12 DIAGNOSIS — D509 Iron deficiency anemia, unspecified: Secondary | ICD-10-CM | POA: Diagnosis not present

## 2024-04-12 DIAGNOSIS — N186 End stage renal disease: Secondary | ICD-10-CM | POA: Diagnosis not present

## 2024-04-14 DIAGNOSIS — Z992 Dependence on renal dialysis: Secondary | ICD-10-CM | POA: Diagnosis not present

## 2024-04-14 DIAGNOSIS — D631 Anemia in chronic kidney disease: Secondary | ICD-10-CM | POA: Diagnosis not present

## 2024-04-14 DIAGNOSIS — R82998 Other abnormal findings in urine: Secondary | ICD-10-CM | POA: Diagnosis not present

## 2024-04-14 DIAGNOSIS — N186 End stage renal disease: Secondary | ICD-10-CM | POA: Diagnosis not present

## 2024-04-14 DIAGNOSIS — D509 Iron deficiency anemia, unspecified: Secondary | ICD-10-CM | POA: Diagnosis not present

## 2024-04-14 DIAGNOSIS — N2589 Other disorders resulting from impaired renal tubular function: Secondary | ICD-10-CM | POA: Diagnosis not present

## 2024-04-14 DIAGNOSIS — N2581 Secondary hyperparathyroidism of renal origin: Secondary | ICD-10-CM | POA: Diagnosis not present

## 2024-04-14 DIAGNOSIS — N184 Chronic kidney disease, stage 4 (severe): Secondary | ICD-10-CM | POA: Diagnosis not present

## 2024-04-14 DIAGNOSIS — E1122 Type 2 diabetes mellitus with diabetic chronic kidney disease: Secondary | ICD-10-CM | POA: Diagnosis not present

## 2024-04-17 DIAGNOSIS — N184 Chronic kidney disease, stage 4 (severe): Secondary | ICD-10-CM | POA: Diagnosis not present

## 2024-04-17 DIAGNOSIS — N2581 Secondary hyperparathyroidism of renal origin: Secondary | ICD-10-CM | POA: Diagnosis not present

## 2024-04-17 DIAGNOSIS — E1122 Type 2 diabetes mellitus with diabetic chronic kidney disease: Secondary | ICD-10-CM | POA: Diagnosis not present

## 2024-04-17 DIAGNOSIS — Z992 Dependence on renal dialysis: Secondary | ICD-10-CM | POA: Diagnosis not present

## 2024-04-17 DIAGNOSIS — D631 Anemia in chronic kidney disease: Secondary | ICD-10-CM | POA: Diagnosis not present

## 2024-04-17 DIAGNOSIS — R82998 Other abnormal findings in urine: Secondary | ICD-10-CM | POA: Diagnosis not present

## 2024-04-17 DIAGNOSIS — D509 Iron deficiency anemia, unspecified: Secondary | ICD-10-CM | POA: Diagnosis not present

## 2024-04-17 DIAGNOSIS — N186 End stage renal disease: Secondary | ICD-10-CM | POA: Diagnosis not present

## 2024-04-17 DIAGNOSIS — N2589 Other disorders resulting from impaired renal tubular function: Secondary | ICD-10-CM | POA: Diagnosis not present

## 2024-04-19 DIAGNOSIS — E1122 Type 2 diabetes mellitus with diabetic chronic kidney disease: Secondary | ICD-10-CM | POA: Diagnosis not present

## 2024-04-19 DIAGNOSIS — N186 End stage renal disease: Secondary | ICD-10-CM | POA: Diagnosis not present

## 2024-04-19 DIAGNOSIS — N2589 Other disorders resulting from impaired renal tubular function: Secondary | ICD-10-CM | POA: Diagnosis not present

## 2024-04-19 DIAGNOSIS — N184 Chronic kidney disease, stage 4 (severe): Secondary | ICD-10-CM | POA: Diagnosis not present

## 2024-04-19 DIAGNOSIS — N2581 Secondary hyperparathyroidism of renal origin: Secondary | ICD-10-CM | POA: Diagnosis not present

## 2024-04-19 DIAGNOSIS — Z992 Dependence on renal dialysis: Secondary | ICD-10-CM | POA: Diagnosis not present

## 2024-04-19 DIAGNOSIS — R82998 Other abnormal findings in urine: Secondary | ICD-10-CM | POA: Diagnosis not present

## 2024-04-19 DIAGNOSIS — D631 Anemia in chronic kidney disease: Secondary | ICD-10-CM | POA: Diagnosis not present

## 2024-04-19 DIAGNOSIS — D509 Iron deficiency anemia, unspecified: Secondary | ICD-10-CM | POA: Diagnosis not present

## 2024-04-21 ENCOUNTER — Other Ambulatory Visit: Payer: Self-pay | Admitting: Cardiovascular Disease

## 2024-04-21 ENCOUNTER — Other Ambulatory Visit: Payer: Self-pay | Admitting: Family Medicine

## 2024-04-21 DIAGNOSIS — D631 Anemia in chronic kidney disease: Secondary | ICD-10-CM | POA: Diagnosis not present

## 2024-04-21 DIAGNOSIS — D509 Iron deficiency anemia, unspecified: Secondary | ICD-10-CM | POA: Diagnosis not present

## 2024-04-21 DIAGNOSIS — N186 End stage renal disease: Secondary | ICD-10-CM | POA: Diagnosis not present

## 2024-04-21 DIAGNOSIS — N184 Chronic kidney disease, stage 4 (severe): Secondary | ICD-10-CM | POA: Diagnosis not present

## 2024-04-21 DIAGNOSIS — N2581 Secondary hyperparathyroidism of renal origin: Secondary | ICD-10-CM | POA: Diagnosis not present

## 2024-04-21 DIAGNOSIS — E785 Hyperlipidemia, unspecified: Secondary | ICD-10-CM

## 2024-04-21 DIAGNOSIS — Z992 Dependence on renal dialysis: Secondary | ICD-10-CM | POA: Diagnosis not present

## 2024-04-21 DIAGNOSIS — R82998 Other abnormal findings in urine: Secondary | ICD-10-CM | POA: Diagnosis not present

## 2024-04-21 DIAGNOSIS — E1122 Type 2 diabetes mellitus with diabetic chronic kidney disease: Secondary | ICD-10-CM | POA: Diagnosis not present

## 2024-04-21 DIAGNOSIS — N2589 Other disorders resulting from impaired renal tubular function: Secondary | ICD-10-CM | POA: Diagnosis not present

## 2024-04-21 NOTE — Telephone Encounter (Signed)
 Requested medications are due for refill today.  yes  Requested medications are on the active medications list.  yes  Last refill. 08/09/2023 #180 0 rf  Future visit scheduled.   yes  Notes to clinic.  Abnormal hospital labs.     Requested Prescriptions  Pending Prescriptions Disp Refills   carvedilol  (COREG ) 12.5 MG tablet [Pharmacy Med Name: CARVEDILOL  12.5 MG TABLET] 180 tablet 1    Sig: TAKE 1 TABLET BY MOUTH 2 TIMES DAILY.     Cardiovascular: Beta Blockers 3 Failed - 04/21/2024  2:33 PM      Failed - Cr in normal range and within 360 days    Creatinine, Ser  Date Value Ref Range Status  10/07/2023 6.30 (H) 0.61 - 1.24 mg/dL Final         Passed - AST in normal range and within 360 days    AST  Date Value Ref Range Status  04/30/2023 19 0 - 40 IU/L Final         Passed - ALT in normal range and within 360 days    ALT  Date Value Ref Range Status  04/30/2023 24 0 - 44 IU/L Final         Passed - Last BP in normal range    BP Readings from Last 1 Encounters:  11/04/23 116/65         Passed - Last Heart Rate in normal range    Pulse Readings from Last 1 Encounters:  11/04/23 71         Passed - Valid encounter within last 6 months    Recent Outpatient Visits           7 months ago Situational anxiety   [facility] [place], Jon HERO, MD

## 2024-04-24 DIAGNOSIS — N2581 Secondary hyperparathyroidism of renal origin: Secondary | ICD-10-CM | POA: Diagnosis not present

## 2024-04-24 DIAGNOSIS — N186 End stage renal disease: Secondary | ICD-10-CM | POA: Diagnosis not present

## 2024-04-24 DIAGNOSIS — N184 Chronic kidney disease, stage 4 (severe): Secondary | ICD-10-CM | POA: Diagnosis not present

## 2024-04-24 DIAGNOSIS — D509 Iron deficiency anemia, unspecified: Secondary | ICD-10-CM | POA: Diagnosis not present

## 2024-04-24 DIAGNOSIS — R82998 Other abnormal findings in urine: Secondary | ICD-10-CM | POA: Diagnosis not present

## 2024-04-24 DIAGNOSIS — D631 Anemia in chronic kidney disease: Secondary | ICD-10-CM | POA: Diagnosis not present

## 2024-04-24 DIAGNOSIS — Z992 Dependence on renal dialysis: Secondary | ICD-10-CM | POA: Diagnosis not present

## 2024-04-24 DIAGNOSIS — N2589 Other disorders resulting from impaired renal tubular function: Secondary | ICD-10-CM | POA: Diagnosis not present

## 2024-04-24 DIAGNOSIS — E1122 Type 2 diabetes mellitus with diabetic chronic kidney disease: Secondary | ICD-10-CM | POA: Diagnosis not present

## 2024-04-25 DIAGNOSIS — L820 Inflamed seborrheic keratosis: Secondary | ICD-10-CM | POA: Diagnosis not present

## 2024-04-25 DIAGNOSIS — D2272 Melanocytic nevi of left lower limb, including hip: Secondary | ICD-10-CM | POA: Diagnosis not present

## 2024-04-25 DIAGNOSIS — L538 Other specified erythematous conditions: Secondary | ICD-10-CM | POA: Diagnosis not present

## 2024-04-25 DIAGNOSIS — D2261 Melanocytic nevi of right upper limb, including shoulder: Secondary | ICD-10-CM | POA: Diagnosis not present

## 2024-04-25 DIAGNOSIS — L570 Actinic keratosis: Secondary | ICD-10-CM | POA: Diagnosis not present

## 2024-04-25 DIAGNOSIS — D2262 Melanocytic nevi of left upper limb, including shoulder: Secondary | ICD-10-CM | POA: Diagnosis not present

## 2024-04-25 DIAGNOSIS — D2271 Melanocytic nevi of right lower limb, including hip: Secondary | ICD-10-CM | POA: Diagnosis not present

## 2024-04-25 DIAGNOSIS — R208 Other disturbances of skin sensation: Secondary | ICD-10-CM | POA: Diagnosis not present

## 2024-04-26 DIAGNOSIS — Z992 Dependence on renal dialysis: Secondary | ICD-10-CM | POA: Diagnosis not present

## 2024-04-26 DIAGNOSIS — R82998 Other abnormal findings in urine: Secondary | ICD-10-CM | POA: Diagnosis not present

## 2024-04-26 DIAGNOSIS — D509 Iron deficiency anemia, unspecified: Secondary | ICD-10-CM | POA: Diagnosis not present

## 2024-04-26 DIAGNOSIS — N2581 Secondary hyperparathyroidism of renal origin: Secondary | ICD-10-CM | POA: Diagnosis not present

## 2024-04-26 DIAGNOSIS — D631 Anemia in chronic kidney disease: Secondary | ICD-10-CM | POA: Diagnosis not present

## 2024-04-26 DIAGNOSIS — E1122 Type 2 diabetes mellitus with diabetic chronic kidney disease: Secondary | ICD-10-CM | POA: Diagnosis not present

## 2024-04-26 DIAGNOSIS — N184 Chronic kidney disease, stage 4 (severe): Secondary | ICD-10-CM | POA: Diagnosis not present

## 2024-04-26 DIAGNOSIS — N2589 Other disorders resulting from impaired renal tubular function: Secondary | ICD-10-CM | POA: Diagnosis not present

## 2024-04-26 DIAGNOSIS — N186 End stage renal disease: Secondary | ICD-10-CM | POA: Diagnosis not present

## 2024-04-26 NOTE — Progress Notes (Addendum)
 PThis patient is appearing on a report for being at risk of failing the Glycemic Status Assessment in Diabetes measure this calendar year.    Last A1c was 6.9% on 12/08/2021  Patient was: Last seen in clinic on 09/20/2023. No follow up appointment scheduled at this time. Will route to admin team for rescheduling.  Angle Karel E. Marsh, PharmD Clinical Pharmacist [facility] [phone]

## 2024-04-28 DIAGNOSIS — N2589 Other disorders resulting from impaired renal tubular function: Secondary | ICD-10-CM | POA: Diagnosis not present

## 2024-04-28 DIAGNOSIS — E1122 Type 2 diabetes mellitus with diabetic chronic kidney disease: Secondary | ICD-10-CM | POA: Diagnosis not present

## 2024-04-28 DIAGNOSIS — D631 Anemia in chronic kidney disease: Secondary | ICD-10-CM | POA: Diagnosis not present

## 2024-04-28 DIAGNOSIS — D509 Iron deficiency anemia, unspecified: Secondary | ICD-10-CM | POA: Diagnosis not present

## 2024-04-28 DIAGNOSIS — N186 End stage renal disease: Secondary | ICD-10-CM | POA: Diagnosis not present

## 2024-04-28 DIAGNOSIS — N184 Chronic kidney disease, stage 4 (severe): Secondary | ICD-10-CM | POA: Diagnosis not present

## 2024-04-28 DIAGNOSIS — R82998 Other abnormal findings in urine: Secondary | ICD-10-CM | POA: Diagnosis not present

## 2024-04-28 DIAGNOSIS — N2581 Secondary hyperparathyroidism of renal origin: Secondary | ICD-10-CM | POA: Diagnosis not present

## 2024-04-28 DIAGNOSIS — Z992 Dependence on renal dialysis: Secondary | ICD-10-CM | POA: Diagnosis not present

## 2024-04-28 NOTE — Progress Notes (Signed)
 Jeremy Dorsey                                          MRN: [id number]   04/28/2024   The [facility] Quality Team Specialist reviewed this patient medical record for the purposes of chart review for care gap closure. The following were reviewed: chart review for care gap closure-glycemic status assessment.    VBCI Quality Team

## 2024-05-01 DIAGNOSIS — N184 Chronic kidney disease, stage 4 (severe): Secondary | ICD-10-CM | POA: Diagnosis not present

## 2024-05-01 DIAGNOSIS — N2581 Secondary hyperparathyroidism of renal origin: Secondary | ICD-10-CM | POA: Diagnosis not present

## 2024-05-01 DIAGNOSIS — Z992 Dependence on renal dialysis: Secondary | ICD-10-CM | POA: Diagnosis not present

## 2024-05-01 DIAGNOSIS — N186 End stage renal disease: Secondary | ICD-10-CM | POA: Diagnosis not present

## 2024-05-01 DIAGNOSIS — N2589 Other disorders resulting from impaired renal tubular function: Secondary | ICD-10-CM | POA: Diagnosis not present

## 2024-05-01 DIAGNOSIS — D631 Anemia in chronic kidney disease: Secondary | ICD-10-CM | POA: Diagnosis not present

## 2024-05-01 DIAGNOSIS — D509 Iron deficiency anemia, unspecified: Secondary | ICD-10-CM | POA: Diagnosis not present

## 2024-05-01 DIAGNOSIS — R82998 Other abnormal findings in urine: Secondary | ICD-10-CM | POA: Diagnosis not present

## 2024-05-01 DIAGNOSIS — E1122 Type 2 diabetes mellitus with diabetic chronic kidney disease: Secondary | ICD-10-CM | POA: Diagnosis not present

## 2024-05-02 DIAGNOSIS — N186 End stage renal disease: Secondary | ICD-10-CM | POA: Diagnosis not present

## 2024-05-02 DIAGNOSIS — Z992 Dependence on renal dialysis: Secondary | ICD-10-CM | POA: Diagnosis not present

## 2024-05-02 DIAGNOSIS — I129 Hypertensive chronic kidney disease with stage 1 through stage 4 chronic kidney disease, or unspecified chronic kidney disease: Secondary | ICD-10-CM | POA: Diagnosis not present

## 2024-05-03 DIAGNOSIS — N186 End stage renal disease: Secondary | ICD-10-CM | POA: Diagnosis not present

## 2024-05-03 DIAGNOSIS — Z23 Encounter for immunization: Secondary | ICD-10-CM | POA: Diagnosis not present

## 2024-05-03 DIAGNOSIS — Z992 Dependence on renal dialysis: Secondary | ICD-10-CM | POA: Diagnosis not present

## 2024-05-03 DIAGNOSIS — N2581 Secondary hyperparathyroidism of renal origin: Secondary | ICD-10-CM | POA: Diagnosis not present

## 2024-05-03 DIAGNOSIS — D631 Anemia in chronic kidney disease: Secondary | ICD-10-CM | POA: Diagnosis not present

## 2024-05-05 DIAGNOSIS — D631 Anemia in chronic kidney disease: Secondary | ICD-10-CM | POA: Diagnosis not present

## 2024-05-05 DIAGNOSIS — Z992 Dependence on renal dialysis: Secondary | ICD-10-CM | POA: Diagnosis not present

## 2024-05-05 DIAGNOSIS — N186 End stage renal disease: Secondary | ICD-10-CM | POA: Diagnosis not present

## 2024-05-05 DIAGNOSIS — N2581 Secondary hyperparathyroidism of renal origin: Secondary | ICD-10-CM | POA: Diagnosis not present

## 2024-05-05 DIAGNOSIS — Z23 Encounter for immunization: Secondary | ICD-10-CM | POA: Diagnosis not present

## 2024-05-07 ENCOUNTER — Other Ambulatory Visit: Payer: Self-pay | Admitting: Cardiovascular Disease

## 2024-05-07 DIAGNOSIS — I25118 Atherosclerotic heart disease of native coronary artery with other forms of angina pectoris: Secondary | ICD-10-CM

## 2024-05-08 DIAGNOSIS — Z23 Encounter for immunization: Secondary | ICD-10-CM | POA: Diagnosis not present

## 2024-05-08 DIAGNOSIS — N186 End stage renal disease: Secondary | ICD-10-CM | POA: Diagnosis not present

## 2024-05-08 DIAGNOSIS — D631 Anemia in chronic kidney disease: Secondary | ICD-10-CM | POA: Diagnosis not present

## 2024-05-08 DIAGNOSIS — N2581 Secondary hyperparathyroidism of renal origin: Secondary | ICD-10-CM | POA: Diagnosis not present

## 2024-05-08 DIAGNOSIS — Z992 Dependence on renal dialysis: Secondary | ICD-10-CM | POA: Diagnosis not present

## 2024-05-10 DIAGNOSIS — Z23 Encounter for immunization: Secondary | ICD-10-CM | POA: Diagnosis not present

## 2024-05-10 DIAGNOSIS — Z992 Dependence on renal dialysis: Secondary | ICD-10-CM | POA: Diagnosis not present

## 2024-05-10 DIAGNOSIS — N186 End stage renal disease: Secondary | ICD-10-CM | POA: Diagnosis not present

## 2024-05-10 DIAGNOSIS — D631 Anemia in chronic kidney disease: Secondary | ICD-10-CM | POA: Diagnosis not present

## 2024-05-10 DIAGNOSIS — N2581 Secondary hyperparathyroidism of renal origin: Secondary | ICD-10-CM | POA: Diagnosis not present

## 2024-05-11 ENCOUNTER — Ambulatory Visit

## 2024-05-12 DIAGNOSIS — D631 Anemia in chronic kidney disease: Secondary | ICD-10-CM | POA: Diagnosis not present

## 2024-05-12 DIAGNOSIS — N186 End stage renal disease: Secondary | ICD-10-CM | POA: Diagnosis not present

## 2024-05-12 DIAGNOSIS — Z23 Encounter for immunization: Secondary | ICD-10-CM | POA: Diagnosis not present

## 2024-05-12 DIAGNOSIS — Z992 Dependence on renal dialysis: Secondary | ICD-10-CM | POA: Diagnosis not present

## 2024-05-12 DIAGNOSIS — N2581 Secondary hyperparathyroidism of renal origin: Secondary | ICD-10-CM | POA: Diagnosis not present

## 2024-05-15 DIAGNOSIS — Z23 Encounter for immunization: Secondary | ICD-10-CM | POA: Diagnosis not present

## 2024-05-15 DIAGNOSIS — D631 Anemia in chronic kidney disease: Secondary | ICD-10-CM | POA: Diagnosis not present

## 2024-05-15 DIAGNOSIS — N2581 Secondary hyperparathyroidism of renal origin: Secondary | ICD-10-CM | POA: Diagnosis not present

## 2024-05-15 DIAGNOSIS — N186 End stage renal disease: Secondary | ICD-10-CM | POA: Diagnosis not present

## 2024-05-15 DIAGNOSIS — Z992 Dependence on renal dialysis: Secondary | ICD-10-CM | POA: Diagnosis not present

## 2024-05-17 DIAGNOSIS — N186 End stage renal disease: Secondary | ICD-10-CM | POA: Diagnosis not present

## 2024-05-17 DIAGNOSIS — Z23 Encounter for immunization: Secondary | ICD-10-CM | POA: Diagnosis not present

## 2024-05-17 DIAGNOSIS — D631 Anemia in chronic kidney disease: Secondary | ICD-10-CM | POA: Diagnosis not present

## 2024-05-17 DIAGNOSIS — N2581 Secondary hyperparathyroidism of renal origin: Secondary | ICD-10-CM | POA: Diagnosis not present

## 2024-05-17 DIAGNOSIS — Z992 Dependence on renal dialysis: Secondary | ICD-10-CM | POA: Diagnosis not present

## 2024-05-18 DIAGNOSIS — Z992 Dependence on renal dialysis: Secondary | ICD-10-CM | POA: Diagnosis not present

## 2024-05-18 DIAGNOSIS — N2581 Secondary hyperparathyroidism of renal origin: Secondary | ICD-10-CM | POA: Diagnosis not present

## 2024-05-18 DIAGNOSIS — N186 End stage renal disease: Secondary | ICD-10-CM | POA: Diagnosis not present

## 2024-05-18 DIAGNOSIS — D631 Anemia in chronic kidney disease: Secondary | ICD-10-CM | POA: Diagnosis not present

## 2024-05-18 DIAGNOSIS — Z23 Encounter for immunization: Secondary | ICD-10-CM | POA: Diagnosis not present

## 2024-05-18 NOTE — Progress Notes (Signed)
 Jeremy Dorsey                                          MRN: [id number]   05/18/2024   The [facility]BCI Quality Team Specialist reviewed this patient medical record for the purposes of chart review for care gap closure. The following were reviewed: chart review for care gap closure-diabetic eye exam.    VBCI Quality Team

## 2024-05-19 DIAGNOSIS — N186 End stage renal disease: Secondary | ICD-10-CM | POA: Diagnosis not present

## 2024-05-19 DIAGNOSIS — D631 Anemia in chronic kidney disease: Secondary | ICD-10-CM | POA: Diagnosis not present

## 2024-05-19 DIAGNOSIS — Z992 Dependence on renal dialysis: Secondary | ICD-10-CM | POA: Diagnosis not present

## 2024-05-19 DIAGNOSIS — Z23 Encounter for immunization: Secondary | ICD-10-CM | POA: Diagnosis not present

## 2024-05-19 DIAGNOSIS — N2581 Secondary hyperparathyroidism of renal origin: Secondary | ICD-10-CM | POA: Diagnosis not present

## 2024-05-22 DIAGNOSIS — N2581 Secondary hyperparathyroidism of renal origin: Secondary | ICD-10-CM | POA: Diagnosis not present

## 2024-05-22 DIAGNOSIS — Z992 Dependence on renal dialysis: Secondary | ICD-10-CM | POA: Diagnosis not present

## 2024-05-22 DIAGNOSIS — N186 End stage renal disease: Secondary | ICD-10-CM | POA: Diagnosis not present

## 2024-05-22 DIAGNOSIS — D631 Anemia in chronic kidney disease: Secondary | ICD-10-CM | POA: Diagnosis not present

## 2024-05-22 DIAGNOSIS — Z23 Encounter for immunization: Secondary | ICD-10-CM | POA: Diagnosis not present

## 2024-05-24 DIAGNOSIS — N186 End stage renal disease: Secondary | ICD-10-CM | POA: Diagnosis not present

## 2024-05-24 DIAGNOSIS — Z23 Encounter for immunization: Secondary | ICD-10-CM | POA: Diagnosis not present

## 2024-05-24 DIAGNOSIS — N2581 Secondary hyperparathyroidism of renal origin: Secondary | ICD-10-CM | POA: Diagnosis not present

## 2024-05-24 DIAGNOSIS — Z992 Dependence on renal dialysis: Secondary | ICD-10-CM | POA: Diagnosis not present

## 2024-05-24 DIAGNOSIS — D631 Anemia in chronic kidney disease: Secondary | ICD-10-CM | POA: Diagnosis not present

## 2024-05-24 NOTE — Progress Notes (Unsigned)
 Cardiology Clinic Note   Date: 05/25/2024 ID: Jeremy Dorsey, DOB [birth year], MRN [id number]  Primary Cardiologist:  Evalene Lunger, MD  Chief Complaint   Jeremy Dorsey is a 73 y.o. male who presents to the clinic today for routine follow up.   Patient Profile   Jeremy Dorsey is followed by Dr. Gollan for the history outlined below.      Past medical history significant for: CAD. LHC 04/24/2020: Ostial proximal RCA 30%.  Ostial to proximal LAD 40%.  Proximal to mid LAD 99%.  Mid LAD 90%.  Mid to distal LAD 60%.  Distal LAD 30%.  PCI with DES 4.0 x 15 mm to proximal to mid LAD, DES 3.0 x 15 mm to mid LAD. LHC 04/16/2023: Distal LAD 30%.  Ostial to proximal LAD 30%.  Ostial proximal RCA 30%.  Mid LAD 10%.  Mid to distal LAD 50%.  Nonstenotic proximal to mid LAD previously treated.  Widely patent LAD stent with minimal restenosis.  Recommendation for continued medical therapy. Echo 05/21/2023: EF 55 to 60%.  No RWMA.  Mild LVH.  Grade II DD.  Normal RV size/function.  Severe LAE.  Mild MR.  Aortic valve sclerosis without stenosis. PAD. Abdominal aortogram with lower extremity 01/29/2022: Right anterior tibial angioplasty, right TP angioplasty, right peroneal angioplasty. Abdominal aortogram with lower extremities 06/11/2022: Right TP trunk and peroneal artery angioplasty. ABIs/lower extremity arterial ultrasound on the right 10/19/2023: Noncompressible bilateral lower extremity arteries.  TBI abnormal bilaterally.  Right arterial ultrasound demonstrated 30 to 49% stenosis proximal superficial femoral artery 50 to 74% stenosis popliteal artery. Hypertension. Hyperlipidemia. ESRD. HD MWF.  In summary, patient was admitted in July 2021 with an NSTEMI.  LHC was deferred at that time for concern for contrast-induced nephropathy.  Echo showed an EF of 50 to 55%, moderate hypokinesis of the basal inferior wall, grade 1 diastolic dysfunction, normal RV systolic function and ventricular cavity  size, aortic valve sclerosis without evidence of stenosis, and an estimated right atrial pressure of 8 mmHg.  Lexiscan  MPI at that time showed a potential small area of pharmacologically induced ischemia involving the apex of the LV and EF of 47%.  He subsequently underwent LHC in September 2021 which showed severe one-vessel CAD the LAD.  He underwent successful IVUS guided PCI/DES to the proximal and mid LAD.   As part of prekidney transplant he has undergone cardiac imaging through [facility] with nuclear stress test in September 2023 showing no evidence of ischemia with an LVEF of 45%.  Echo at that time showed an EF of 65% with normal LV diastolic function, normal RV systolic function with upper normal ventricular cavity size, trivial pericardial effusion without evidence of hemodynamic compromise, and no significant valvular abnormality.  Stress cMRI in January 2024 was abnormal with diffuse perfusion defect of all basal-mid LV segments extending beyond areas of scarring suggestive of mixed ischemia and infarction concerning for multivessel disease, LVEF 74%, moderate concentric LVH, normal RV systolic function and ventricular cavity size, mild MR and TR, and diffuse patchy subendocardial fibrosis possibly representing nonischemic scarring related to hypertensive heart disease versus areas of infarction.  In September 2024 he reported he had been taken off the renal transplant list in the setting of the above abnormal stress MRI.  He was without symptoms of angina. LHC on 04/16/2023 showed widely patent LAD stent with minimal restenosis.  Echo October 2024 demonstrated EF 55 to 60%.  He was evaluated by Dr. Cherrie in advanced heart  failure clinic for possible cardiac amyloid.  He underwent myocardial amyloid imaging which was negative for amyloid.  Patient has a history of end-stage renal artery disease for which he undergoes hemodialysis.  He is followed by vascular surgery for PAD with multiple  angioplasty procedures to right lower lower extremity.     History of Present Illness    Today, patient is accompanied by his wife. He reports he is doing well. Patient denies shortness of breath, dyspnea on exertion, lower extremity edema, orthopnea or PND. No chest pain, pressure, or tightness. No palpitations.  He continues to make urine about 3 times a day. Hie undergoes hemodialysis at home MWF. His wife reports she has noticed BP tends to be low, particularly DBP, with dialysis. Upon review of medications, patient reports he is taking both metoprolol  tartrate and carvedilol . He is unsure when he started taking the metoprolol  or who prescribed it.     ROS: All other systems reviewed and are otherwise negative except as noted in History of Present Illness.  EKGs/Labs Reviewed    EKG Interpretation Date/Time:  Thursday May 25 2024 11:05:01 EDT Ventricular Rate:  75 PR Interval:  248 QRS Duration:  94 QT Interval:  436 QTC Calculation: 486 R Axis:   -4  Text Interpretation: Sinus rhythm with 1st degree A-V block ST & T wave abnormality, consider lateral ischemia When compared with ECG of 21-Jun-2023 13:46, No significant change was found Confirmed by Loistine Sober [phone]) on 05/25/2024 11:22:40 AM   10/07/2023: BUN 35; Creatinine, Ser 6.30; Potassium 3.8; Sodium 134   06/23/2023: WBC 8.1 10/07/2023: Hemoglobin 9.5   Physical Exam    VS:  BP (!) 112/52   Pulse 75   Ht 5' 9 (1.753 m)   Wt 219 lb (99.3 kg)   SpO2 95%   BMI 32.34 kg/m  , BMI Body mass index is 32.34 kg/m.  GEN: Well nourished, well developed, in no acute distress. Neck: No JVD or carotid bruits. Cardiac:  RRR.  No murmur. No rubs or gallops.   Respiratory:  Respirations regular and unlabored. Clear to auscultation without rales, wheezing or rhonchi. GI: Soft, nontender, nondistended. Extremities: Radials/DP/PT 2+ and equal bilaterally. No clubbing or cyanosis. No edema   Skin: Warm and dry, no  rash. Neuro: Strength intact.  Assessment & Plan   CAD S/p PCI with DES to proximal to mid LAD and DES to mid LAD September 2021.  LHC September 2024 demonstrated patent stents and nonobstructive CAD.  Patient denies chest pain, pressure or tightness.  - Continue aspirin , Plavix , atorvastatin , carvedilol , as needed SL NTG.  PAD S/p angioplasty to right lower extremity in 2023 by vascular surgery.  Patient denies claudication.  - Continue aspirin , Plavix , atorvastatin . - Continue to follow with vascular surgery.  Hypertension/ESRD on HD BP today 112/52. Patient's wife report BP trends down, particularly DBP, around dialysis. Patient denies lightheadedness or dizziness. In reviewing medications patient states he is taking both carvedilol  and metoprolol . He is instructed to stop metoprolol  and continue carvedilol . He is to contact the office if BP continues to be soft with dialysis. Would consider holding carvedilol  until the afternoon on dialysis days or decreasing morning dose on dialysis days.  - Continue carvedilol .  Hyperlipidemia Patient has not had lipids checked recently.  - Continue atorvastatin . - Lipid panel, CBC and BMP today.   Disposition: Lipid panel, CBC, BMP today. Return in 6 months or sooner as needed.          Signed,  Barnie HERO. Calib Wadhwa, DNP, NP-C

## 2024-05-25 ENCOUNTER — Ambulatory Visit: Attending: Student | Admitting: Student

## 2024-05-25 ENCOUNTER — Encounter: Payer: Self-pay | Admitting: Student

## 2024-05-25 VITALS — BP 112/52 | HR 75 | Ht 69.0 in | Wt 219.0 lb

## 2024-05-25 DIAGNOSIS — I10 Essential (primary) hypertension: Secondary | ICD-10-CM | POA: Diagnosis not present

## 2024-05-25 DIAGNOSIS — Z79899 Other long term (current) drug therapy: Secondary | ICD-10-CM

## 2024-05-25 DIAGNOSIS — I2510 Atherosclerotic heart disease of native coronary artery without angina pectoris: Secondary | ICD-10-CM

## 2024-05-25 DIAGNOSIS — E785 Hyperlipidemia, unspecified: Secondary | ICD-10-CM

## 2024-05-25 DIAGNOSIS — Z992 Dependence on renal dialysis: Secondary | ICD-10-CM

## 2024-05-25 DIAGNOSIS — I739 Peripheral vascular disease, unspecified: Secondary | ICD-10-CM

## 2024-05-25 DIAGNOSIS — N186 End stage renal disease: Secondary | ICD-10-CM

## 2024-05-25 NOTE — Patient Instructions (Signed)
 Medication Instructions:   Your physician recommends the following medication changes.  STOP TAKING: Metoprolol   *If you need a refill on your cardiac medications before your next appointment, please call your pharmacy*  Lab Work:  Your provider would like for you to have following labs drawn today BMet, CBC, Lipid Panel.    If you have labs (blood work) drawn today and your tests are completely normal, you will receive your results only by:  MyChart Message (if you have MyChart) OR  A paper copy in the mail If you have any lab test that is abnormal or we need to change your treatment, we will call you to review the results.  Testing/Procedures:  None ordered at this time   Referrals:  None ordered at this time   Follow-Up:  At [facility], you and your health needs are our priority.  As part of our continuing mission to provide you with exceptional heart care, our providers are all part of one team.  This team includes your primary Cardiologist (physician) and Advanced Practice Providers or APPs (Physician Assistants and Nurse Practitioners) who all work together to provide you with the care you need, when you need it.  Your next appointment:   5 - 6 month(s)  Provider:    Evalene Lunger, MD or Barnie Hila, NP    We recommend signing up for the patient portal called MyChart.  Sign up information is provided on this After Visit Summary.  MyChart is used to connect with patients for Virtual Visits (Telemedicine).  Patients are able to view lab/test results, encounter notes, upcoming appointments, etc.  Non-urgent messages can be sent to your provider as well.   To learn more about what you can do with MyChart, go to [email or web address].

## 2024-05-26 ENCOUNTER — Ambulatory Visit: Payer: Self-pay | Admitting: Student

## 2024-05-26 DIAGNOSIS — Z992 Dependence on renal dialysis: Secondary | ICD-10-CM | POA: Diagnosis not present

## 2024-05-26 DIAGNOSIS — D631 Anemia in chronic kidney disease: Secondary | ICD-10-CM | POA: Diagnosis not present

## 2024-05-26 DIAGNOSIS — Z23 Encounter for immunization: Secondary | ICD-10-CM | POA: Diagnosis not present

## 2024-05-26 DIAGNOSIS — N2581 Secondary hyperparathyroidism of renal origin: Secondary | ICD-10-CM | POA: Diagnosis not present

## 2024-05-26 DIAGNOSIS — N186 End stage renal disease: Secondary | ICD-10-CM | POA: Diagnosis not present

## 2024-05-26 LAB — BASIC METABOLIC PANEL WITH GFR
BUN/Creatinine Ratio: 5 — ABNORMAL LOW (ref 10–24)
BUN: 28 mg/dL — ABNORMAL HIGH (ref 8–27)
CO2: 27 mmol/L (ref 20–29)
Calcium: 9.6 mg/dL (ref 8.6–10.2)
Chloride: 95 mmol/L — ABNORMAL LOW (ref 96–106)
Creatinine, Ser: 5.81 mg/dL — ABNORMAL HIGH (ref 0.76–1.27)
Glucose: 135 mg/dL — ABNORMAL HIGH (ref 70–99)
Potassium: 3.2 mmol/L — ABNORMAL LOW (ref 3.5–5.2)
Sodium: 138 mmol/L (ref 134–144)
eGFR: 10 mL/min/1.73 — ABNORMAL LOW (ref >59)

## 2024-05-26 LAB — CBC
Hematocrit: 25 % — ABNORMAL LOW (ref 37.5–51.0)
Hemoglobin: 8.3 g/dL — ABNORMAL LOW (ref 13.0–17.7)
MCH: 31.1 pg (ref 26.6–33.0)
MCHC: 33.2 g/dL (ref 31.5–35.7)
MCV: 94 fL (ref 79–97)
Platelets: 184 x10E3/uL (ref 150–450)
RBC: 2.67 x10E6/uL — CL (ref 4.14–5.80)
RDW: 12.5 % (ref 11.6–15.4)
WBC: 6.5 x10E3/uL (ref 3.4–10.8)

## 2024-05-26 LAB — LIPID PANEL
Chol/HDL Ratio: 2.8 ratio (ref 0.0–5.0)
Cholesterol, Total: 66 mg/dL — ABNORMAL LOW (ref 100–199)
HDL: 24 mg/dL — ABNORMAL LOW (ref >39)
LDL Chol Calc (NIH): 28 mg/dL (ref 0–99)
Triglycerides: 54 mg/dL (ref 0–149)
VLDL Cholesterol Cal: 14 mg/dL (ref 5–40)

## 2024-05-29 DIAGNOSIS — Z23 Encounter for immunization: Secondary | ICD-10-CM | POA: Diagnosis not present

## 2024-05-29 DIAGNOSIS — N2581 Secondary hyperparathyroidism of renal origin: Secondary | ICD-10-CM | POA: Diagnosis not present

## 2024-05-29 DIAGNOSIS — D631 Anemia in chronic kidney disease: Secondary | ICD-10-CM | POA: Diagnosis not present

## 2024-05-29 DIAGNOSIS — Z992 Dependence on renal dialysis: Secondary | ICD-10-CM | POA: Diagnosis not present

## 2024-05-29 DIAGNOSIS — N186 End stage renal disease: Secondary | ICD-10-CM | POA: Diagnosis not present

## 2024-05-30 DIAGNOSIS — Z992 Dependence on renal dialysis: Secondary | ICD-10-CM | POA: Diagnosis not present

## 2024-05-30 DIAGNOSIS — Z23 Encounter for immunization: Secondary | ICD-10-CM | POA: Diagnosis not present

## 2024-05-30 DIAGNOSIS — N2581 Secondary hyperparathyroidism of renal origin: Secondary | ICD-10-CM | POA: Diagnosis not present

## 2024-05-30 DIAGNOSIS — N186 End stage renal disease: Secondary | ICD-10-CM | POA: Diagnosis not present

## 2024-05-30 DIAGNOSIS — D631 Anemia in chronic kidney disease: Secondary | ICD-10-CM | POA: Diagnosis not present

## 2024-05-31 ENCOUNTER — Encounter: Payer: Self-pay | Admitting: Emergency Medicine

## 2024-05-31 DIAGNOSIS — D631 Anemia in chronic kidney disease: Secondary | ICD-10-CM | POA: Diagnosis not present

## 2024-05-31 DIAGNOSIS — N2581 Secondary hyperparathyroidism of renal origin: Secondary | ICD-10-CM | POA: Diagnosis not present

## 2024-05-31 DIAGNOSIS — Z992 Dependence on renal dialysis: Secondary | ICD-10-CM | POA: Diagnosis not present

## 2024-05-31 DIAGNOSIS — N186 End stage renal disease: Secondary | ICD-10-CM | POA: Diagnosis not present

## 2024-05-31 DIAGNOSIS — Z23 Encounter for immunization: Secondary | ICD-10-CM | POA: Diagnosis not present

## 2024-05-31 NOTE — Progress Notes (Signed)
 Letter sent

## 2024-06-02 DIAGNOSIS — D631 Anemia in chronic kidney disease: Secondary | ICD-10-CM | POA: Diagnosis not present

## 2024-06-02 DIAGNOSIS — I129 Hypertensive chronic kidney disease with stage 1 through stage 4 chronic kidney disease, or unspecified chronic kidney disease: Secondary | ICD-10-CM | POA: Diagnosis not present

## 2024-06-02 DIAGNOSIS — N2581 Secondary hyperparathyroidism of renal origin: Secondary | ICD-10-CM | POA: Diagnosis not present

## 2024-06-02 DIAGNOSIS — Z992 Dependence on renal dialysis: Secondary | ICD-10-CM | POA: Diagnosis not present

## 2024-06-02 DIAGNOSIS — N186 End stage renal disease: Secondary | ICD-10-CM | POA: Diagnosis not present

## 2024-06-02 DIAGNOSIS — Z23 Encounter for immunization: Secondary | ICD-10-CM | POA: Diagnosis not present

## 2024-06-05 DIAGNOSIS — N186 End stage renal disease: Secondary | ICD-10-CM | POA: Diagnosis not present

## 2024-06-05 DIAGNOSIS — N2581 Secondary hyperparathyroidism of renal origin: Secondary | ICD-10-CM | POA: Diagnosis not present

## 2024-06-05 DIAGNOSIS — Z992 Dependence on renal dialysis: Secondary | ICD-10-CM | POA: Diagnosis not present

## 2024-06-05 DIAGNOSIS — D509 Iron deficiency anemia, unspecified: Secondary | ICD-10-CM | POA: Diagnosis not present

## 2024-06-05 DIAGNOSIS — D631 Anemia in chronic kidney disease: Secondary | ICD-10-CM | POA: Diagnosis not present

## 2024-06-07 DIAGNOSIS — N2581 Secondary hyperparathyroidism of renal origin: Secondary | ICD-10-CM | POA: Diagnosis not present

## 2024-06-07 DIAGNOSIS — D509 Iron deficiency anemia, unspecified: Secondary | ICD-10-CM | POA: Diagnosis not present

## 2024-06-07 DIAGNOSIS — N186 End stage renal disease: Secondary | ICD-10-CM | POA: Diagnosis not present

## 2024-06-07 DIAGNOSIS — Z992 Dependence on renal dialysis: Secondary | ICD-10-CM | POA: Diagnosis not present

## 2024-06-07 DIAGNOSIS — D631 Anemia in chronic kidney disease: Secondary | ICD-10-CM | POA: Diagnosis not present

## 2024-06-09 DIAGNOSIS — N186 End stage renal disease: Secondary | ICD-10-CM | POA: Diagnosis not present

## 2024-06-09 DIAGNOSIS — D631 Anemia in chronic kidney disease: Secondary | ICD-10-CM | POA: Diagnosis not present

## 2024-06-09 DIAGNOSIS — Z992 Dependence on renal dialysis: Secondary | ICD-10-CM | POA: Diagnosis not present

## 2024-06-09 DIAGNOSIS — D509 Iron deficiency anemia, unspecified: Secondary | ICD-10-CM | POA: Diagnosis not present

## 2024-06-09 DIAGNOSIS — N2581 Secondary hyperparathyroidism of renal origin: Secondary | ICD-10-CM | POA: Diagnosis not present

## 2024-06-12 DIAGNOSIS — Z992 Dependence on renal dialysis: Secondary | ICD-10-CM | POA: Diagnosis not present

## 2024-06-12 DIAGNOSIS — N186 End stage renal disease: Secondary | ICD-10-CM | POA: Diagnosis not present

## 2024-06-12 DIAGNOSIS — D509 Iron deficiency anemia, unspecified: Secondary | ICD-10-CM | POA: Diagnosis not present

## 2024-06-12 DIAGNOSIS — D631 Anemia in chronic kidney disease: Secondary | ICD-10-CM | POA: Diagnosis not present

## 2024-06-12 DIAGNOSIS — N2581 Secondary hyperparathyroidism of renal origin: Secondary | ICD-10-CM | POA: Diagnosis not present

## 2024-06-14 DIAGNOSIS — N186 End stage renal disease: Secondary | ICD-10-CM | POA: Diagnosis not present

## 2024-06-14 DIAGNOSIS — D631 Anemia in chronic kidney disease: Secondary | ICD-10-CM | POA: Diagnosis not present

## 2024-06-14 DIAGNOSIS — D509 Iron deficiency anemia, unspecified: Secondary | ICD-10-CM | POA: Diagnosis not present

## 2024-06-14 DIAGNOSIS — N2581 Secondary hyperparathyroidism of renal origin: Secondary | ICD-10-CM | POA: Diagnosis not present

## 2024-06-14 DIAGNOSIS — Z992 Dependence on renal dialysis: Secondary | ICD-10-CM | POA: Diagnosis not present

## 2024-06-15 DIAGNOSIS — D509 Iron deficiency anemia, unspecified: Secondary | ICD-10-CM | POA: Diagnosis not present

## 2024-06-15 DIAGNOSIS — Z992 Dependence on renal dialysis: Secondary | ICD-10-CM | POA: Diagnosis not present

## 2024-06-15 DIAGNOSIS — N2581 Secondary hyperparathyroidism of renal origin: Secondary | ICD-10-CM | POA: Diagnosis not present

## 2024-06-15 DIAGNOSIS — D631 Anemia in chronic kidney disease: Secondary | ICD-10-CM | POA: Diagnosis not present

## 2024-06-15 DIAGNOSIS — N186 End stage renal disease: Secondary | ICD-10-CM | POA: Diagnosis not present

## 2024-06-16 DIAGNOSIS — N2581 Secondary hyperparathyroidism of renal origin: Secondary | ICD-10-CM | POA: Diagnosis not present

## 2024-06-16 DIAGNOSIS — D509 Iron deficiency anemia, unspecified: Secondary | ICD-10-CM | POA: Diagnosis not present

## 2024-06-16 DIAGNOSIS — D631 Anemia in chronic kidney disease: Secondary | ICD-10-CM | POA: Diagnosis not present

## 2024-06-16 DIAGNOSIS — Z992 Dependence on renal dialysis: Secondary | ICD-10-CM | POA: Diagnosis not present

## 2024-06-16 DIAGNOSIS — N186 End stage renal disease: Secondary | ICD-10-CM | POA: Diagnosis not present

## 2024-06-19 DIAGNOSIS — N186 End stage renal disease: Secondary | ICD-10-CM | POA: Diagnosis not present

## 2024-06-19 DIAGNOSIS — D509 Iron deficiency anemia, unspecified: Secondary | ICD-10-CM | POA: Diagnosis not present

## 2024-06-19 DIAGNOSIS — D631 Anemia in chronic kidney disease: Secondary | ICD-10-CM | POA: Diagnosis not present

## 2024-06-19 DIAGNOSIS — N2581 Secondary hyperparathyroidism of renal origin: Secondary | ICD-10-CM | POA: Diagnosis not present

## 2024-06-19 DIAGNOSIS — Z992 Dependence on renal dialysis: Secondary | ICD-10-CM | POA: Diagnosis not present

## 2024-06-21 DIAGNOSIS — D631 Anemia in chronic kidney disease: Secondary | ICD-10-CM | POA: Diagnosis not present

## 2024-06-21 DIAGNOSIS — N2581 Secondary hyperparathyroidism of renal origin: Secondary | ICD-10-CM | POA: Diagnosis not present

## 2024-06-21 DIAGNOSIS — D509 Iron deficiency anemia, unspecified: Secondary | ICD-10-CM | POA: Diagnosis not present

## 2024-06-21 DIAGNOSIS — N186 End stage renal disease: Secondary | ICD-10-CM | POA: Diagnosis not present

## 2024-06-21 DIAGNOSIS — Z992 Dependence on renal dialysis: Secondary | ICD-10-CM | POA: Diagnosis not present

## 2024-06-23 DIAGNOSIS — D509 Iron deficiency anemia, unspecified: Secondary | ICD-10-CM | POA: Diagnosis not present

## 2024-06-23 DIAGNOSIS — Z992 Dependence on renal dialysis: Secondary | ICD-10-CM | POA: Diagnosis not present

## 2024-06-23 DIAGNOSIS — N2581 Secondary hyperparathyroidism of renal origin: Secondary | ICD-10-CM | POA: Diagnosis not present

## 2024-06-23 DIAGNOSIS — D631 Anemia in chronic kidney disease: Secondary | ICD-10-CM | POA: Diagnosis not present

## 2024-06-23 DIAGNOSIS — N186 End stage renal disease: Secondary | ICD-10-CM | POA: Diagnosis not present

## 2024-06-23 NOTE — Progress Notes (Signed)
 Jeremy Dorsey                                          MRN: [id number]   06/23/2024   The [facility] Quality Team Specialist reviewed this patient medical record for the purposes of chart review for care gap closure. The following were reviewed: abstraction for care gap closure-glycemic status assessment.    [facility]BCI Quality Team

## 2024-06-26 DIAGNOSIS — Z992 Dependence on renal dialysis: Secondary | ICD-10-CM | POA: Diagnosis not present

## 2024-06-26 DIAGNOSIS — N186 End stage renal disease: Secondary | ICD-10-CM | POA: Diagnosis not present

## 2024-06-26 DIAGNOSIS — N2581 Secondary hyperparathyroidism of renal origin: Secondary | ICD-10-CM | POA: Diagnosis not present

## 2024-06-26 DIAGNOSIS — D509 Iron deficiency anemia, unspecified: Secondary | ICD-10-CM | POA: Diagnosis not present

## 2024-06-26 DIAGNOSIS — D631 Anemia in chronic kidney disease: Secondary | ICD-10-CM | POA: Diagnosis not present

## 2024-06-28 DIAGNOSIS — Z992 Dependence on renal dialysis: Secondary | ICD-10-CM | POA: Diagnosis not present

## 2024-06-28 DIAGNOSIS — N186 End stage renal disease: Secondary | ICD-10-CM | POA: Diagnosis not present

## 2024-06-28 DIAGNOSIS — D509 Iron deficiency anemia, unspecified: Secondary | ICD-10-CM | POA: Diagnosis not present

## 2024-06-28 DIAGNOSIS — N2581 Secondary hyperparathyroidism of renal origin: Secondary | ICD-10-CM | POA: Diagnosis not present

## 2024-06-28 DIAGNOSIS — D631 Anemia in chronic kidney disease: Secondary | ICD-10-CM | POA: Diagnosis not present

## 2024-06-30 DIAGNOSIS — N2581 Secondary hyperparathyroidism of renal origin: Secondary | ICD-10-CM | POA: Diagnosis not present

## 2024-06-30 DIAGNOSIS — N186 End stage renal disease: Secondary | ICD-10-CM | POA: Diagnosis not present

## 2024-06-30 DIAGNOSIS — D631 Anemia in chronic kidney disease: Secondary | ICD-10-CM | POA: Diagnosis not present

## 2024-06-30 DIAGNOSIS — D509 Iron deficiency anemia, unspecified: Secondary | ICD-10-CM | POA: Diagnosis not present

## 2024-06-30 DIAGNOSIS — Z992 Dependence on renal dialysis: Secondary | ICD-10-CM | POA: Diagnosis not present

## 2024-07-02 DIAGNOSIS — N186 End stage renal disease: Secondary | ICD-10-CM | POA: Diagnosis not present

## 2024-07-02 DIAGNOSIS — I129 Hypertensive chronic kidney disease with stage 1 through stage 4 chronic kidney disease, or unspecified chronic kidney disease: Secondary | ICD-10-CM | POA: Diagnosis not present

## 2024-07-02 DIAGNOSIS — Z992 Dependence on renal dialysis: Secondary | ICD-10-CM | POA: Diagnosis not present

## 2024-07-12 ENCOUNTER — Other Ambulatory Visit: Payer: Self-pay | Admitting: Cardiovascular Disease

## 2024-07-12 DIAGNOSIS — I25118 Atherosclerotic heart disease of native coronary artery with other forms of angina pectoris: Secondary | ICD-10-CM

## 2024-07-14 ENCOUNTER — Other Ambulatory Visit: Payer: Self-pay | Admitting: Cardiovascular Disease

## 2024-07-14 DIAGNOSIS — E785 Hyperlipidemia, unspecified: Secondary | ICD-10-CM

## 2024-07-15 ENCOUNTER — Other Ambulatory Visit: Payer: Self-pay | Admitting: Cardiovascular Disease

## 2024-07-15 DIAGNOSIS — I25118 Atherosclerotic heart disease of native coronary artery with other forms of angina pectoris: Secondary | ICD-10-CM

## 2024-07-28 ENCOUNTER — Other Ambulatory Visit: Payer: Self-pay | Admitting: Nephrology

## 2024-07-28 DIAGNOSIS — R1115 Cyclical vomiting syndrome unrelated to migraine: Secondary | ICD-10-CM

## 2024-07-28 DIAGNOSIS — R6881 Early satiety: Secondary | ICD-10-CM

## 2024-08-09 ENCOUNTER — Encounter: Payer: Self-pay | Admitting: Vascular Surgery

## 2024-08-10 ENCOUNTER — Ambulatory Visit
Admission: RE | Admit: 2024-08-10 | Discharge: 2024-08-10 | Disposition: A | Discharge status: Home / Self Care | Source: Ambulatory Visit | Attending: Nephrology | Admitting: Nephrology

## 2024-08-10 ENCOUNTER — Other Ambulatory Visit: Payer: Self-pay | Admitting: Nephrology

## 2024-08-10 DIAGNOSIS — R1115 Cyclical vomiting syndrome unrelated to migraine: Secondary | ICD-10-CM | POA: Diagnosis present

## 2024-08-10 DIAGNOSIS — R6881 Early satiety: Secondary | ICD-10-CM | POA: Insufficient documentation

## 2024-08-23 ENCOUNTER — Other Ambulatory Visit: Payer: Self-pay

## 2024-08-23 DIAGNOSIS — I739 Peripheral vascular disease, unspecified: Secondary | ICD-10-CM

## 2024-08-24 ENCOUNTER — Ambulatory Visit (HOSPITAL_COMMUNITY)
Admission: RE | Admit: 2024-08-24 | Discharge: 2024-08-24 | Disposition: A | Discharge status: Home / Self Care | Source: Ambulatory Visit | Attending: Vascular Surgery | Admitting: Vascular Surgery

## 2024-08-24 ENCOUNTER — Ambulatory Visit (HOSPITAL_BASED_OUTPATIENT_CLINIC_OR_DEPARTMENT_OTHER)
Admission: RE | Admit: 2024-08-24 | Discharge: 2024-08-24 | Disposition: A | Discharge status: Home / Self Care | Source: Ambulatory Visit | Attending: Vascular Surgery | Admitting: Vascular Surgery

## 2024-08-24 DIAGNOSIS — I739 Peripheral vascular disease, unspecified: Secondary | ICD-10-CM | POA: Insufficient documentation

## 2024-08-24 LAB — VAS US ABI WITH/WO TBI

## 2024-09-06 NOTE — Progress Notes (Signed)
 WEN MERCED                                          MRN: [id number]   09/06/2024   The [facility]BCI Quality Team Specialist reviewed this patient medical record for the purposes of chart review for care gap closure. The following were reviewed: chart review for care gap closure-glycemic status assessment.    VBCI Quality Team

## 2024-09-08 ENCOUNTER — Emergency Department

## 2024-09-08 ENCOUNTER — Other Ambulatory Visit: Payer: Self-pay

## 2024-09-08 ENCOUNTER — Inpatient Hospital Stay: Admission: EM | Admit: 2024-09-08 | Source: Home / Self Care | Admitting: Family Medicine

## 2024-09-08 DIAGNOSIS — F32A Depression, unspecified: Secondary | ICD-10-CM | POA: Insufficient documentation

## 2024-09-08 DIAGNOSIS — R262 Difficulty in walking, not elsewhere classified: Secondary | ICD-10-CM | POA: Diagnosis present

## 2024-09-08 DIAGNOSIS — M25552 Pain in left hip: Secondary | ICD-10-CM

## 2024-09-08 DIAGNOSIS — I10 Essential (primary) hypertension: Secondary | ICD-10-CM | POA: Diagnosis present

## 2024-09-08 DIAGNOSIS — I25118 Atherosclerotic heart disease of native coronary artery with other forms of angina pectoris: Secondary | ICD-10-CM

## 2024-09-08 DIAGNOSIS — E785 Hyperlipidemia, unspecified: Secondary | ICD-10-CM | POA: Insufficient documentation

## 2024-09-08 DIAGNOSIS — M25551 Pain in right hip: Secondary | ICD-10-CM

## 2024-09-08 DIAGNOSIS — E1142 Type 2 diabetes mellitus with diabetic polyneuropathy: Secondary | ICD-10-CM | POA: Insufficient documentation

## 2024-09-08 DIAGNOSIS — N186 End stage renal disease: Secondary | ICD-10-CM

## 2024-09-08 DIAGNOSIS — R1900 Intra-abdominal and pelvic swelling, mass and lump, unspecified site: Principal | ICD-10-CM

## 2024-09-08 LAB — CBC WITH DIFFERENTIAL/PLATELET
Abs Immature Granulocytes: 0.04 10*3/uL (ref 0.00–0.07)
Basophils Absolute: 0 10*3/uL (ref 0.0–0.1)
Basophils Relative: 1 %
Eosinophils Absolute: 0.1 10*3/uL (ref 0.0–0.5)
Eosinophils Relative: 2 %
HCT: 27.3 % — ABNORMAL LOW (ref 39.0–52.0)
Hemoglobin: 8.6 g/dL — ABNORMAL LOW (ref 13.0–17.0)
Immature Granulocytes: 1 %
Lymphocytes Relative: 13 %
Lymphs Abs: 1 10*3/uL (ref 0.7–4.0)
MCH: 29 pg (ref 26.0–34.0)
MCHC: 31.5 g/dL (ref 30.0–36.0)
MCV: 91.9 fL (ref 80.0–100.0)
Monocytes Absolute: 0.6 10*3/uL (ref 0.1–1.0)
Monocytes Relative: 8 %
Neutro Abs: 5.6 10*3/uL (ref 1.7–7.7)
Neutrophils Relative %: 75 %
Platelets: 191 10*3/uL (ref 150–400)
RBC: 2.97 MIL/uL — ABNORMAL LOW (ref 4.22–5.81)
RDW: 15.5 % (ref 11.5–15.5)
WBC: 7.3 10*3/uL (ref 4.0–10.5)
nRBC: 0 % (ref 0.0–0.2)

## 2024-09-08 LAB — COMPREHENSIVE METABOLIC PANEL WITH GFR
ALT: 8 U/L (ref 0–44)
AST: 13 U/L — ABNORMAL LOW (ref 15–41)
Albumin: 3.8 g/dL (ref 3.5–5.0)
Alkaline Phosphatase: 124 U/L (ref 38–126)
Anion gap: 11 (ref 5–15)
BUN: 23 mg/dL (ref 8–23)
CO2: 30 mmol/L (ref 22–32)
Calcium: 9.6 mg/dL (ref 8.9–10.3)
Chloride: 95 mmol/L — ABNORMAL LOW (ref 98–111)
Creatinine, Ser: 3.74 mg/dL — ABNORMAL HIGH (ref 0.61–1.24)
GFR, Estimated: 16 mL/min — ABNORMAL LOW
Glucose, Bld: 164 mg/dL — ABNORMAL HIGH (ref 70–99)
Potassium: 2.8 mmol/L — ABNORMAL LOW (ref 3.5–5.1)
Sodium: 136 mmol/L (ref 135–145)
Total Bilirubin: 0.7 mg/dL (ref 0.0–1.2)
Total Protein: 6.5 g/dL (ref 6.5–8.1)

## 2024-09-08 LAB — PROTIME-INR
INR: 1.2 (ref 0.8–1.2)
Prothrombin Time: 15.8 s — ABNORMAL HIGH (ref 11.4–15.2)

## 2024-09-08 MED ORDER — CARVEDILOL 6.25 MG PO TABS: 12.5000 mg | ORAL_TABLET | Freq: Two times a day (BID) | ORAL | Status: AC

## 2024-09-08 MED ORDER — ONDANSETRON HCL 4 MG PO TABS: 4.0000 mg | ORAL_TABLET | Freq: Four times a day (QID) | ORAL | Status: AC | PRN

## 2024-09-08 MED ORDER — SODIUM CHLORIDE 0.9 % IV SOLN: INTRAVENOUS | Status: AC

## 2024-09-08 MED ORDER — CALCITRIOL 0.25 MCG PO CAPS: 0.2500 ug | ORAL_CAPSULE | ORAL | Status: AC

## 2024-09-08 MED ORDER — ACETAMINOPHEN 650 MG RE SUPP: 650.0000 mg | Freq: Four times a day (QID) | RECTAL | Status: AC | PRN

## 2024-09-08 MED ORDER — ACETAMINOPHEN 325 MG PO TABS: 650.0000 mg | ORAL_TABLET | Freq: Four times a day (QID) | ORAL | Status: AC | PRN

## 2024-09-08 MED ORDER — TORSEMIDE 20 MG PO TABS: 100.0000 mg | ORAL_TABLET | Freq: Every morning | ORAL | Status: AC

## 2024-09-08 MED ORDER — CINACALCET HCL 30 MG PO TABS
30.0000 mg | ORAL_TABLET | Freq: Every day | ORAL | Status: AC
  Filled 2024-09-08: qty 1

## 2024-09-08 MED ORDER — ONDANSETRON HCL 4 MG/2ML IJ SOLN: 4.0000 mg | Freq: Four times a day (QID) | INTRAMUSCULAR | Status: AC | PRN

## 2024-09-08 MED ORDER — TRAZODONE HCL 50 MG PO TABS: 25.0000 mg | ORAL_TABLET | Freq: Every evening | ORAL | Status: AC | PRN

## 2024-09-08 MED ORDER — MAGNESIUM HYDROXIDE 400 MG/5ML PO SUSP: 30.0000 mL | Freq: Every day | ORAL | Status: AC | PRN

## 2024-09-08 MED ORDER — ENOXAPARIN SODIUM 40 MG/0.4ML IJ SOSY: 40.0000 mg | PREFILLED_SYRINGE | INTRAMUSCULAR | Status: DC

## 2024-09-08 MED ORDER — ATORVASTATIN CALCIUM 20 MG PO TABS: 80.0000 mg | ORAL_TABLET | Freq: Every day | ORAL | Status: AC

## 2024-09-08 MED ORDER — MORPHINE SULFATE (PF) 4 MG/ML IV SOLN
4.0000 mg | Freq: Once | INTRAVENOUS | Status: AC
  Administered 2024-09-08: 4 mg via INTRAMUSCULAR
  Filled 2024-09-08: qty 1

## 2024-09-08 MED ORDER — ENOXAPARIN SODIUM 30 MG/0.3ML IJ SOSY: 30.0000 mg | PREFILLED_SYRINGE | INTRAMUSCULAR | Status: AC

## 2024-09-08 MED ORDER — MORPHINE SULFATE (PF) 2 MG/ML IV SOLN
2.0000 mg | INTRAVENOUS | Status: AC | PRN
  Administered 2024-09-08: 2 mg via INTRAVENOUS
  Filled 2024-09-08: qty 1

## 2024-09-08 MED ORDER — RENA-VITE PO TABS: 1.0000 | ORAL_TABLET | Freq: Every day | ORAL | Status: AC

## 2024-09-08 MED ORDER — NITROGLYCERIN 0.4 MG SL SUBL: 0.4000 mg | SUBLINGUAL_TABLET | SUBLINGUAL | Status: AC | PRN

## 2024-09-08 MED ORDER — GABAPENTIN 100 MG PO CAPS: 100.0000 mg | ORAL_CAPSULE | Freq: Every day | ORAL | Status: AC

## 2024-09-08 NOTE — ED Provider Notes (Signed)
 "  [facility] Provider Note    Event Date/Time   First MD Initiated Contact with Patient 09/08/24 1714     (approximate)  History   Chief Complaint: Hip Pain  HPI  Jeremy Dorsey is a 74 y.o. male with a past medical history of anemia, CKD, diabetes, hypertension, hyperlipidemia, ESRD on HD Monday/Wednesday/Friday, presents to the emergency department for right hip pain.  According to the patient over the last 2 days he has been experiencing significant pain in the right hip.  States this got to the point where he is unable to stand on the right hip due to pain.  Patient denies any injuries, denies any trauma.  Physical Exam   Triage Vital Signs: ED Triage Vitals  Encounter Vitals Group     BP 09/08/24 1538 (!) 144/57     Girls Systolic BP Percentile --      Girls Diastolic BP Percentile --      Boys Systolic BP Percentile --      Boys Diastolic BP Percentile --      Pulse Rate 09/08/24 1538 93     Resp 09/08/24 1538 19     Temp 09/08/24 1538 98.9 F (37.2 C)     Temp Source 09/08/24 1538 Oral     SpO2 09/08/24 1538 93 %     Weight 09/08/24 1539 208 lb 5.4 oz (94.5 kg)     Height 09/08/24 1539 5' 10 (1.778 m)     Head Circumference --      Peak Flow --      Pain Score 09/08/24 1606 10     Pain Loc --      Pain Education --      Exclude from Growth Chart --     Most recent vital signs: Vitals:   09/08/24 1538  BP: (!) 144/57  Pulse: 93  Resp: 19  Temp: 98.9 F (37.2 C)  SpO2: 93%    General: Awake, no distress.  CV:  Good peripheral perfusion.  Regular rate and rhythm  Resp:  Normal effort.  Equal breath sounds bilaterally.  Abd:  No distention.  Soft, nontender. Other:  Patient has 2+ lower extremity edema bilaterally but nontender no skin color changes.  Patient has tenderness with range of motion of the right hip no obvious tenderness to palpation.  Neurovascularly intact with warm feet bilaterally.   ED Results / Procedures /  Treatments   EKG  EKG viewed and interpreted by myself shows a sinus rhythm at 93 bpm with a narrow QRS, normal axis, PR prolongation slight QTc prolongation, nonspecific ST changes.  No ST elevation.  RADIOLOGY  I have reviewed and interpreted the hip x-ray images.  No obvious fracture seen on my evaluation. Radiology has read the x-ray as mild osteoarthritis possible lytic lesion of the left femoral head.  Patient denies any history of cancer besides a basal cell carcinoma that was removed on his skin.   MEDICATIONS ORDERED IN ED: Medications  morphine  (PF) 4 MG/ML injection 4 mg (has no administration in time range)     IMPRESSION / MDM / ASSESSMENT AND PLAN / ED COURSE  I reviewed the triage vital signs and the nursing notes.  Patient's presentation is most consistent with acute presentation with potential threat to life or bodily function.  Patient presents to the emergency department for right hip pain.  Denies any trauma.  Patient's lab work today is reassuring with a normal/reassuring CBC chronic anemia unchanged  from baseline.  Patient's chemistry shows chronic kidney disease patient is on dialysis had it this morning with a reassuring potassium.  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  No fracture identified.  They do see a possible lytic lesion of the left femoral head.  Given both these findings and the patient's continued pain we will dose pain medication obtain CT imaging of the pelvis to evaluate both hips.  Patient agreeable to plan.  CT scan has resulted showing possible osteolytic lesion.  Patient does state a history of bladder cancer approximately 5 years ago, but was told that this completely resolved after the surgery.  No other cancer history known to the patient.  Patient has had a chronic cough per wife for the last for 5 months.  Given the CT findings inability to ambulate due to  pain we will admit to the hospitalist service.  Patient does urinate each day however we will obtain CT scans chest abdomen pelvis without contrast for metastatic evaluation.  FINAL CLINICAL IMPRESSION(S) / ED DIAGNOSES   Right hip pain Metastatic disease  Note:  This document was prepared using Dragon voice recognition software and may include unintentional dictation errors.   Dorothyann Drivers, MD 09/08/24 1946  "

## 2024-09-08 NOTE — Assessment & Plan Note (Addendum)
-   This is associated with right hip pain in the setting of a pelvic mass and suspected osteolytic lesion. - The patient will be admitted to a medical-surgical bed. - Pain management will be provided. - Orthopedic consult will be obtained. - Oncology consult will be obtained. - I notified Dr.Bigby and Dr. Babara about the patient.

## 2024-09-08 NOTE — ED Provider Triage Note (Signed)
 Emergency Medicine Provider Triage Evaluation Note  ELIZAR ALPERN , a 74 y.o. male  was evaluated in triage.  Pt complains of right hip pain.  No fall.  Symptoms started 2 days ago.  Pain goes from the hip into the groin.  Patient has not peritoneal dialysis patient and last dialysis was performed earlier today.  No pain like this in the past..  Physical Exam  BP (!) 144/57 (BP Location: Right Arm)   Pulse 93   Temp 98.9 F (37.2 C) (Oral)   Resp 19   Ht 5' 10 (1.778 m)   Wt 94.5 kg   SpO2 93%   BMI 29.89 kg/m  Gen:   Awake, no distress   Resp:  Normal effort  MSK:   Moves extremities Other:    Medical Decision Making  Medically screening exam initiated at 4:07 PM.  Appropriate orders placed.  HERSEL MCMEEN was informed that the remainder of the evaluation will be completed by another provider, this initial triage assessment does not replace that evaluation, and the importance of remaining in the ED until their evaluation is complete.     Herlinda Kirk NOVAK, FNP 09/08/24 1734

## 2024-09-08 NOTE — H&P (Incomplete)
 "     [place]   PATIENT NAME: Jeremy Dorsey    MR#:  [id number]  DATE OF BIRTH:  11/27/1950  DATE OF ADMISSION:  09/08/2024  PRIMARY CARE PHYSICIAN: Sharma Coyer, MD   Patient is coming from: Home  REQUESTING/REFERRING PHYSICIAN: Dorothyann Drivers, MD  CHIEF COMPLAINT:   Chief Complaint  Patient presents with   Hip Pain    HISTORY OF PRESENT ILLNESS:  Jeremy Dorsey is a 74 y.o. Caucasian male with medical history significant for ESRD on HD on MWF, coronary artery disease, type 2 diabetes mellitus, hypertension, dyslipidemia, peripheral vascular disease and CVA, presented to the emergency room with acute onset of right hip pain which has been going on over the last couple of days giving him significant difficulty in standing up or walking.  No recent falls or injuries.  No fever or chills.  No nausea or vomiting or abdominal pain.  No chest pain or palpitations.  No paresthesias or other focal muscle weakness.  He has been compliant with his dialysis and his medications.  ED Course: When he came to the ER, BP was 144/57 with otherwise normal vital signs.  Labs revealed hypokalemia of 2.8 and hypochloremia of 95, blood glucose 164 and creatinine 3.74 with a BUN of 23 and otherwise unremarkable CMP.  CBC showed anemia with hemoglobin 8.6 hematocrit 27.3 close to baseline.  INR was 1.2 and PT 15.8 EKG as reviewed by me :  EKG showed sinus rhythm at a rate of 93 with first-degree AV block and incomplete right bundle branch block with prolonged QT interval with QTc of 509 MS. Imaging: Pelvic CT scan without contrast revealed the following: 1. No acute fracture or dislocation of the pelvis or hips. 2. Osteolytic soft tissue mass at the superior femoral neck and lateral acetabulum, measuring approximately 2.8 x 4.6 x 4.4 cm, suspicious for osteolytic metastasis; contrast-enhanced MRI is recommended for further evaluation. 3. Moderate bilateral hip arthritis. 4.  Moderate aortoiliac atherosclerotic calcification. 5. RAF score includes aortic atherosclerosis.  The patient was given 4 mg IV morphine  sulfate.  He will be admitted to medical-surgical bed for further evaluation and management.  PAST MEDICAL HISTORY:   Past Medical History:  Diagnosis Date   Anemia    Cancer (HCC)    basal cell nose and forehead   Cataract 2016   Cataract surgery bith eyes   CKD (chronic kidney disease), stage IV (HCC)    Coronary artery disease    a. s/p IVUS-guided DESx2 to prox & mid LAD, residual disease treated medically. EF 50-55% by recent echo 02/2020.   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   Urinary complication     PAST SURGICAL HISTORY:   Past Surgical History:  Procedure Laterality Date   ABDOMINAL AORTOGRAM W/LOWER EXTREMITY N/A 01/29/2022   Procedure: ABDOMINAL AORTOGRAM W/ Bilateral LOWER EXTREMITY [place];  Surgeon: Gretta Lonni PARAS, MD;  Location: [facility];  Service: Cardiovascular;  Laterality: N/A;   ABDOMINAL AORTOGRAM W/LOWER EXTREMITY N/A 06/11/2022   Procedure: ABDOMINAL AORTOGRAM W/LOWER EXTREMITY;  Surgeon: Gretta Lonni PARAS, MD;  Location: [facility];  Service: Cardiovascular;  Laterality: N/A;   AV FISTULA PLACEMENT Left 10/31/2020  Procedure: LEFT UPPER EXTREMITY ARTERIOVENOUS (AV) FISTULA CREATION;  Surgeon: Sheree Penne Bruckner, MD;  Location: [facility] OR;  Service: Vascular;  Laterality: Left;   AV FISTULA PLACEMENT Left 07/09/2021   Procedure: LEFT ARM ARTERIOVENOUS (AV) FISTULA CREATION;  Surgeon: Serene Gaile ORN, MD;  Location: [facility] OR;  Service: Vascular;  Laterality: Left;   CARPAL TUNNEL RELEASE Right 10/07/2023   Procedure: CARPAL TUNNEL RELEASE;  Surgeon: Murrell Drivers, MD;  Location: [facility]  OR;  Service: Orthopedics;  Laterality: Right;   COLONOSCOPY  1990   COLONOSCOPY  06/09/2011   Dr Dessa   CORONARY STENT INTERVENTION N/A 04/24/2020   Procedure: CORONARY STENT INTERVENTION;  Surgeon: Darron Deatrice LABOR, MD;  Location: [facility];  Service: Cardiovascular;  Laterality: N/A;   CORONARY ULTRASOUND/IVUS N/A 04/24/2020   Procedure: Intravascular Ultrasound/IVUS;  Surgeon: Darron Deatrice LABOR, MD;  Location: [facility];  Service: Cardiovascular;  Laterality: N/A;   CYSTOSCOPY W/ RETROGRADES Bilateral 08/01/2018   Procedure: CYSTOSCOPY WITH RETROGRADE PYELOGRAM;  Surgeon: Francisca Redell BROCKS, MD;  Location: [facility] ORS;  Service: Urology;  Laterality: Bilateral;   CYSTOSCOPY WITH BIOPSY N/A 08/01/2018   Procedure: CYSTOSCOPY WITH Bladder BIOPSY;  Surgeon: Francisca Redell BROCKS, MD;  Location: [facility] ORS;  Service: Urology;  Laterality: N/A;   CYSTOSCOPY WITH STENT PLACEMENT Right 08/01/2018   Procedure: CYSTOSCOPY WITH STENT PLACEMENT;  Surgeon: Francisca Redell BROCKS, MD;  Location: [facility] ORS;  Service: Urology;  Laterality: Right;   DIALYSIS/PERMA CATHETER INSERTION Left 09/16/2023   Procedure: DIALYSIS/PERMA CATHETER INSERTION;  Surgeon: Norine Manuelita LABOR, MD;  Location: [facility] INVASIVE CV LAB;  Service: Cardiovascular;  Laterality: Left;   DIALYSIS/PERMA CATHETER INSERTION Right 11/04/2023   Procedure: DIALYSIS/PERMA CATHETER INSERTION;  Surgeon: Melia Lynwood ORN, MD;  Location: [facility] INVASIVE CV LAB;  Service: Cardiovascular;  Laterality: Right;   DIALYSIS/PERMA CATHETER REMOVAL Left 11/04/2023   Procedure: DIALYSIS/PERMA CATHETER REMOVAL;  Surgeon: Melia Lynwood ORN, MD;  Location: [facility] INVASIVE CV LAB;  Service: Cardiovascular;  Laterality: Left;   EYE SURGERY Right    laser surgery   FRACTURE SURGERY     Rt shoulder rotaor cuff   IR THROMBECTOMY AV FISTULA W/THROMBOLYSIS/PTA INC/SHUNT/IMG LEFT Left 06/22/2023   IR US  GUIDE VASC ACCESS LEFT  06/22/2023   LEFT HEART CATH AND CORONARY ANGIOGRAPHY N/A  04/24/2020   Procedure: LEFT HEART CATH AND CORONARY ANGIOGRAPHY;  Surgeon: Darron Deatrice LABOR, MD;  Location: [facility];  Service: Cardiovascular;  Laterality: N/A;   LEFT HEART CATH AND CORONARY ANGIOGRAPHY Left 04/16/2023   Procedure: LEFT HEART CATH AND CORONARY ANGIOGRAPHY;  Surgeon: Darron Deatrice LABOR, MD;  Location: [facility] INV[facility];  Service: Cardiovascular;  Laterality: Left;   PERIPHERAL VASCULAR BALLOON ANGIOPLASTY Right 01/29/2022   Procedure: PERIPHERAL VASCULAR BALLOON ANGIOPLASTY;  Surgeon: Gretta Bruckner PARAS, MD;  Location: [facility];  Service: Cardiovascular;  Laterality: Right;   PERIPHERAL VASCULAR BALLOON ANGIOPLASTY Right 06/11/2022   Procedure: PERIPHERAL VASCULAR BALLOON ANGIOPLASTY;  Surgeon: Gretta Bruckner PARAS, MD;  Location: [facility];  Service: Cardiovascular;  Laterality: Right;  Peroneal   PERIPHERAL VASCULAR BALLOON ANGIOPLASTY  09/15/2023   Procedure: PERIPHERAL VASCULAR BALLOON ANGIOPLASTY;  Surgeon: Tobie Gordy POUR, MD;  Location: [facility] INVASIVE CV LAB;  Service: Cardiovascular;;  Outflow/Inflow Cephalic Vein   PERIPHERAL VASCULAR INTERVENTION  09/15/2023   Procedure: PERIPHERAL VASCULAR INTERVENTION;  Surgeon: Tobie Gordy POUR, MD;  Location: [facility] INVASIVE CV LAB;  Service: Cardiovascular;;  8x10 Viabahn   PERIPHERAL VASCULAR THROMBECTOMY N/A  09/15/2023   Procedure: PERIPHERAL VASCULAR THROMBECTOMY;  Surgeon: Tobie Gordy POUR, MD;  Location: [facility] INVASIVE CV LAB;  Service: Cardiovascular;  Laterality: N/A;   SHOULDER ARTHROSCOPY WITH OPEN ROTATOR CUFF REPAIR Right 08/25/2018   Procedure: SHOULDER ARTHROSCOPY WITH MINI OPEN ROTATOR CUFF REPAIR;  Surgeon: Marchia Drivers, MD;  Location: [facility] ORS;  Service: Orthopedics;  Laterality: Right;   TRANSURETHRAL RESECTION OF BLADDER TUMOR N/A 08/01/2018   Procedure: TRANSURETHRAL RESECTION OF BLADDER TUMOR (TURBT);  Surgeon: Francisca Redell BROCKS, MD;  Location: [facility];  Service: Urology;  Laterality: N/A;    URETEROSCOPY WITH HOLMIUM LASER LITHOTRIPSY Right 08/01/2018   Procedure: URETEROSCOPY WITH HOLMIUM LASER LITHOTRIPSY;  Surgeon: Francisca Redell BROCKS, MD;  Location: [facility] OR[facility];  Service: Urology;  Laterality: Right;    SOCIAL HISTORY:   Social History   Tobacco Use   Smoking status: Former    Current packs/day: 0.00    Average packs/day: 1 pack/day for 30.0 years (30.0 ttl pk-yrs)    Types: Cigarettes    Start date: 08/22/1977    Quit date: 08/23/2007    Years since quitting: 17.0    Passive exposure: Past   Smokeless tobacco: Never  Substance Use Topics   Alcohol use: No    FAMILY HISTORY:   Family History  Problem Relation Age of Onset   Psoriasis Mother    Heart failure Father     DRUG ALLERGIES:  Allergies[1]  REVIEW OF SYSTEMS:   ROS As per history of present illness. All pertinent systems were reviewed above. Constitutional, HEENT, cardiovascular, respiratory, GI, GU, musculoskeletal, neuro, psychiatric, endocrine, integumentary and hematologic systems were reviewed and are otherwise negative/unremarkable except for positive findings mentioned above in the HPI.   MEDICATIONS AT HOME:   Prior to Admission medications  Medication Sig Start Date End Date Taking? Authorizing Provider  aspirin  EC (ASPIRIN  LOW DOSE) 81 MG tablet TAKE 1 TABLET BY MOUTH EVERY MORNING (SWALLOW WHOLE) 04/13/23  Yes Gollan, Evalene PARAS, MD  atorvastatin  (LIPITOR ) 80 MG tablet Take 1 tablet (80 mg total) by mouth daily. 07/14/24  Yes Gollan, Timothy J, MD  AURYXIA 1 GM 210 MG(Fe) tablet Take 420 mg by mouth 3 (three) times daily with meals. 04/22/22  Yes [provider]  carvedilol  (COREG ) 12.5 MG tablet TAKE 1 TABLET BY MOUTH 2 TIMES DAILY. 04/25/24  Yes Simmons-Robinson, Makiera, MD  clopidogrel  (PLAVIX ) 75 MG tablet TAKE 1 TABLET BY MOUTH DAILY 07/17/24  Yes Gollan, Timothy J, MD  diazepam  (VALIUM ) 5 MG tablet Take 1 tablet (5 mg total) by mouth every 12 (twelve) hours as needed for anxiety.  09/20/23  Yes Bacigalupo, Jon HERO, MD  FLUoxetine  (PROZAC ) 20 MG capsule Take 1 capsule (20 mg total) by mouth daily. 09/20/23  Yes Bacigalupo, Angela M, MD  fluticasone (FLONASE) 50 MCG/ACT nasal spray Place 2 sprays into both nostrils daily. 09/04/24  Yes [provider]  hydrocortisone 2.5 % cream Apply 1 Application topically 2 (two) times daily as needed. 05/15/24  Yes [provider]  Methoxy PEG-Epoetin  Beta (MIRCERA IJ) Inject 1 each into the skin See admin instructions. During Dialysis 04/26/23  Yes [provider]  mirtazapine (REMERON) 15 MG tablet Take 15 mg by mouth at bedtime.   Yes [provider]  multivitamin (RENA-VIT) TABS tablet Take 1 tablet by mouth daily.   Yes [provider]  nitroGLYCERIN  (NITROSTAT ) 0.4 MG SL tablet DISSOLVE 1 TAB UNDER TONGUE FOR CHEST PAIN - IF PAIN REMAINS AFTER 5 MIN, CALL 911 AND  REPEAT DOSE. MAX 3 TABS IN 15 MINUTES 08/17/23  Yes Gollan, Timothy J, MD  omeprazole (PRILOSEC) 40 MG capsule Take 40 mg by mouth daily. 08/18/24  Yes [provider]  torsemide  (DEMADEX ) 100 MG tablet Take 100 mg by mouth every morning. 01/20/22  Yes [provider]  calcitRIOL  (ROCALTROL ) 0.25 MCG capsule Take 0.25 mcg by mouth every Monday, Wednesday, and Friday with hemodialysis.    [provider]  cinacalcet  (SENSIPAR ) 30 MG tablet Take 30 mg by mouth daily with breakfast. Patient not taking: Reported on 09/08/2024 09/24/23   [provider]  gabapentin  (NEURONTIN ) 100 MG capsule Take 100 mg by mouth at bedtime. Patient not taking: Reported on 09/08/2024 05/18/24   [provider]  lidocaine -prilocaine (EMLA) cream Apply 1 Application topically daily as needed [facility] access). Patient not taking: No sig reported    [provider]  sildenafil (VIAGRA) 100 MG tablet Take 100 mg by mouth daily as needed (Keep arterties open). Per doctor instruction 03/30/22   [provider]       VITAL SIGNS:  Blood pressure (!) 144/57, pulse 93, temperature 98.9 F (37.2 C), temperature source Oral, resp. rate 19, height 5' 10 (1.778 m), weight 94.5 kg, SpO2 93%.  PHYSICAL EXAMINATION:  Physical Exam  GENERAL:  74 y.o.-year-old patient lying in the bed with no acute distress.  EYES: Pupils equal, round, reactive to light and accommodation. No scleral icterus. Extraocular muscles intact.  HEENT: Head atraumatic, normocephalic. Oropharynx and nasopharynx clear.  NECK:  Supple, no jugular venous distention. No thyroid enlargement, no tenderness.  LUNGS: Normal breath sounds bilaterally, no wheezing, rales,rhonchi or crepitation. No use of accessory muscles of respiration.  CARDIOVASCULAR: Regular rate and rhythm, S1, S2 normal. No murmurs, rubs, or gallops.  ABDOMEN: Soft, nondistended, nontender. Bowel sounds present. No organomegaly or mass.  EXTREMITIES: No pedal edema, cyanosis, or clubbing.  NEUROLOGIC: Cranial nerves II through XII are intact. Muscle strength 5/5 in all extremities. Sensation intact. Gait not checked.  PSYCHIATRIC: The patient is alert and oriented x 3.  Normal affect and good eye contact. Musculoskeletal: Right hip tenderness with decreased range of motion secondary to pain SKIN: No obvious rash, lesion, or ulcer.   LABORATORY PANEL:   CBC Recent Labs  Lab 09/08/24 1611  WBC 7.3  HGB 8.6*  HCT 27.3*  PLT 191   ------------------------------------------------------------------------------------------------------------------  Chemistries  Recent Labs  Lab 09/08/24 1611  NA 136  K 2.8*  CL 95*  CO2 30  GLUCOSE 164*  BUN 23  CREATININE 3.74*  CALCIUM  9.6  AST 13*  ALT 8  ALKPHOS 124  BILITOT 0.7   ------------------------------------------------------------------------------------------------------------------  Cardiac Enzymes No results for input(s): TROPONINI in the last 168  hours. ------------------------------------------------------------------------------------------------------------------  RADIOLOGY:  CT CHEST ABDOMEN PELVIS WO CONTRAST Result Date: 09/08/2024 CLINICAL DATA:  Right-sided hip pain cough EXAM: CT CHEST, ABDOMEN AND PELVIS WITHOUT CONTRAST TECHNIQUE: Multidetector CT imaging of the chest, abdomen and pelvis was performed following the standard protocol without IV contrast. RADIATION DOSE REDUCTION: This exam was performed according to the departmental dose-optimization program which includes automated exposure control, adjustment of the mA and/or kV according to patient size and/or use of iterative reconstruction technique. COMPARISON:  CT 09/08/2024, 01/29/2023 FINDINGS: CT CHEST FINDINGS Cardiovascular: Limited assessment without intravenous contrast. Moderate aortic atherosclerosis. No aneurysm. Multi vessel coronary vascular calcification. Normal cardiac size. No sizable pericardial effusion Mediastinum/Nodes: Patent trachea. No suspicious thyroid mass. Multiple subcentimeter mediastinal lymph nodes. Esophagus within normal limits Lungs/Pleura:  Small bilateral pleural effusions. Dependent atelectasis. Mild mosaic attenuation suggestive of small airways disease. Musculoskeletal: Sternum appears intact. No acute osseous abnormality CT ABDOMEN PELVIS FINDINGS Hepatobiliary: No focal liver abnormality is seen. No gallstones, gallbladder wall thickening, or biliary dilatation. Pancreas: Unremarkable. No pancreatic ductal dilatation or surrounding inflammatory changes. Spleen: Normal in size without focal abnormality. Adrenals/Urinary Tract: Adrenal glands are normal. Atrophic native kidneys. Scattered small kidney stones. No hydronephrosis. Cysts and subcentimeter hypodensities too small to further characterize, no specific imaging follow-up is recommended. The bladder is unremarkable Stomach/Bowel: Stomach within normal limits. No dilated small bowel. No acute  bowel wall thickening Vascular/Lymphatic: Aortic atherosclerosis. No aneurysm. Retroaortic left renal vein. No suspicious lymph nodes Reproductive: Negative prostate Other: No ascites or free air.  Subcutaneous edema. Musculoskeletal: Soft tissue thickening about the left hip with lytic change/erosion at the superolateral left femur/left femoral neck and lateral aspect of the acetabulum. Suspicion of a small right hip effusion. Small superficial/cutaneous cystic lesion at the anterior aspect of the right hip, series 2, image 110. Correlate with direct inspection IMPRESSION: 1. Small bilateral pleural effusions with dependent atelectasis. Mild mosaic attenuation suggestive of small airways disease. 2. No CT evidence for acute intra-abdominal or pelvic abnormality. 3. Soft tissue thickening about the left hip with lytic change/1erosion at the superolateral left femur /left femoral neck and lateral aspect of the acetabulum. Findings could be infectious/inflammatory versus neoplastic in origin. Reference pelvis CT previously performed. 4. Atrophic kidneys with small nonobstructing kidney stones. 5. Aortic atherosclerosis. Aortic Atherosclerosis (ICD10-I70.0). Electronically Signed   By: Luke Bun M.D.   On: 09/08/2024 20:26   CT PELVIS WO CONTRAST Result Date: 09/08/2024 EXAM: CT PELVIS, WITHOUT IV CONTRAST 09/08/2024 06:10:02 PM TECHNIQUE: Axial images were acquired through the pelvis without IV contrast. Reformatted images were reviewed. Automated exposure control, iterative reconstruction, and/or weight based adjustment of the mA/kV was utilized to reduce the radiation dose to as low as reasonably achievable. COMPARISON: None available. CLINICAL HISTORY: Hip trauma with fracture on X-ray. Right hip pain. Lytic lesion in left hip on X-ray. History of blood cancer status post transurethral resection. FINDINGS: BONES: There is an osteolytic soft tissue mass superior aspect of the femoral neck and lateral aspect of  the acetabulum. This mass measures roughly 2.8 x 4.6 x 4.4 cm in dimension and is suspicious for an osteolytic metastasis in this patient with a history of bladder cancer. Contrast-enhanced MRI examination is recommended for further evaluation. No superimposed acute fracture or dislocation. JOINTS: Moderate bilateral hip arthritis. No dislocation. SOFT TISSUES: The soft tissues are unremarkable. INTRAPELVIC CONTENTS: Limited images of the intrapelvic contents demonstrate moderate aortoiliac atherosclerotic calcification. No other acute abnormality is seen in the limited intrapelvic contents. RAF SCORE: Aortic atherosclerosis (icd10-i70.0) IMPRESSION: 1. No acute fracture or dislocation of the pelvis or hips. 2. Osteolytic soft tissue mass at the superior femoral neck and lateral acetabulum, measuring approximately 2.8 x 4.6 x 4.4 cm, suspicious for osteolytic metastasis; contrast-enhanced MRI is recommended for further evaluation. 3. Moderate bilateral hip arthritis. 4. Moderate aortoiliac atherosclerotic calcification. 5. RAF score includes aortic atherosclerosis (ICD10-I70.0). Electronically signed by: Dorethia Molt MD 09/08/2024 06:29 PM EST RP Workstation: [id number]   DG Hip Unilat W or Wo Pelvis 2-3 Views Right Result Date: 09/08/2024 CLINICAL DATA:  Right hip pain for 2 days. EXAM: DG HIP (WITH OR WITHOUT PELVIS) 2-3V RIGHT COMPARISON:  January 29, 2023 FINDINGS: There is no evidence of hip fracture or dislocation. Mild joint space narrowing and osteophyte formation is  seen involving right hip. However, there is noted lucency involving the superior and lateral portion of left femoral head concerning for possible lytic lesion. Dedicated radiographs of left hip are recommended IMPRESSION: 1. Mild osteoarthritis of right hip. 2. Possible lytic lesion seen involving superior and lateral portion of left femoral head. Dedicated radiographs of left hip are recommended. Electronically Signed   By: Lynwood Landy Raddle M.D.    On: 09/08/2024 17:03      IMPRESSION AND PLAN:  Assessment and Plan: * Unable to ambulate - This is associated with right hip pain in the setting of a pelvic mass and suspected osteolytic lesion. - The patient will be admitted to a medical-surgical bed. - Pain management will be provided. - Orthopedic consult will be obtained. - Oncology consult will be obtained. - I notified Dr.Bigby and Dr. Babara about the patient.  ESRD on hemodialysis (HCC) - Will continue calcitriol . - Nephrology consult to be obtained.  I notified Dr. Korrapti about the patient  Essential hypertension - Will continue antihypertensive therapy.  Type 2 diabetes mellitus with peripheral neuropathy (HCC) - The patient will be placed on supplemental coverage with NovoLog . - Will continue Neurontin .  Depression - Will continue Prozac . - She has a history of anxiety. - Continue Valium   Dyslipidemia - Continue statin therapy.   DVT prophylaxis: Lovenox .  Advanced Care Planning:  Code Status: full code.  Family Communication:  The plan of care was discussed in details with the patient (and family). I answered all questions. The patient agreed to proceed with the above mentioned plan. Further management will depend upon hospital course. Disposition Plan: Back to previous home environment Consults called: Oncology and orthopedic consults All the records are reviewed and case discussed with ED provider.  Status is: Inpatient  At the time of the admission, it appears that the appropriate admission status for this patient is inpatient.  This is judged to be reasonable and necessary in order to provide the required intensity of service to ensure the patient's safety given the presenting symptoms, physical exam findings and initial radiographic and laboratory data in the context of comorbid conditions.  The patient requires inpatient status due to high intensity of service, high risk of further deterioration and  high frequency of surveillance required.  I certify that at the time of admission, it is my clinical judgment that the patient will require inpatient hospital care extending more than 2 midnights.                            Dispo: The patient is from: Home              Anticipated d/c is to: Home              Patient currently is not medically stable to d/c.              Difficult to place patient: No  Madison DELENA Peaches M.D on 09/08/2024 at 10:02 PM  [facility]   From 7 PM-7 AM, contact night-coverage [email or web address]  CC: Primary care physician; Sharma Coyer, MD     [1]  Allergies Allergen Reactions   Oxycodone  Nausea And Vomiting   Hydrocodone  Nausea And Vomiting   "

## 2024-09-08 NOTE — Assessment & Plan Note (Signed)
 Continue statin therapy

## 2024-09-08 NOTE — Assessment & Plan Note (Signed)
-   Will continue calcitriol . - Nephrology consult to be obtained.  I notified Dr. Korrapti about the patient

## 2024-09-08 NOTE — Assessment & Plan Note (Signed)
-   The patient will be placed on supplemental coverage with NovoLog. - Will continue Neurontin.

## 2024-09-08 NOTE — Assessment & Plan Note (Signed)
-   Will continue Prozac . - She has a history of anxiety. - Continue Valium

## 2024-09-08 NOTE — Assessment & Plan Note (Signed)
-   Will continue antihypertensive therapy.

## 2024-09-08 NOTE — ED Triage Notes (Signed)
 Pt comes in via pov with complaints of right hip pain for the past 2 days. Pt states that he is unable to put weight on the right leg. Pt complains of pain 10/10. Pt also has a chronic cough as well, and is currently on reflux medication. Pt is a home dialysis pt.

## 2024-09-19 ENCOUNTER — Ambulatory Visit: Admitting: Vascular Surgery

## 2024-11-02 ENCOUNTER — Ambulatory Visit: Admitting: Student

## 2025-03-07 ENCOUNTER — Ambulatory Visit

## 2025-03-09 ENCOUNTER — Ambulatory Visit

## 2025-03-13 ENCOUNTER — Ambulatory Visit
